# Patient Record
Sex: Female | Born: 1955 | Race: White | Hispanic: No | Smoking: Former smoker
Health system: Southern US, Community
[De-identification: ages and names within clinical notes are randomized; demographics above are authoritative.]

## PROBLEM LIST (undated history)

## (undated) DIAGNOSIS — B1920 Unspecified viral hepatitis C without hepatic coma: Secondary | ICD-10-CM

## (undated) DIAGNOSIS — N200 Calculus of kidney: Secondary | ICD-10-CM

## (undated) DIAGNOSIS — M810 Age-related osteoporosis without current pathological fracture: Secondary | ICD-10-CM

## (undated) DIAGNOSIS — C3492 Malignant neoplasm of unspecified part of left bronchus or lung: Secondary | ICD-10-CM

## (undated) DIAGNOSIS — Z972 Presence of dental prosthetic device (complete) (partial): Secondary | ICD-10-CM

## (undated) DIAGNOSIS — C73 Malignant neoplasm of thyroid gland: Secondary | ICD-10-CM

## (undated) DIAGNOSIS — E785 Hyperlipidemia, unspecified: Secondary | ICD-10-CM

## (undated) DIAGNOSIS — E039 Hypothyroidism, unspecified: Secondary | ICD-10-CM

## (undated) DIAGNOSIS — J449 Chronic obstructive pulmonary disease, unspecified: Secondary | ICD-10-CM

## (undated) DIAGNOSIS — D499 Neoplasm of unspecified behavior of unspecified site: Secondary | ICD-10-CM

## (undated) DIAGNOSIS — C229 Malignant neoplasm of liver, not specified as primary or secondary: Secondary | ICD-10-CM

## (undated) DIAGNOSIS — K2270 Barrett's esophagus without dysplasia: Secondary | ICD-10-CM

## (undated) DIAGNOSIS — R918 Other nonspecific abnormal finding of lung field: Secondary | ICD-10-CM

## (undated) DIAGNOSIS — Z87442 Personal history of urinary calculi: Secondary | ICD-10-CM

## (undated) DIAGNOSIS — E119 Type 2 diabetes mellitus without complications: Secondary | ICD-10-CM

## (undated) DIAGNOSIS — I499 Cardiac arrhythmia, unspecified: Secondary | ICD-10-CM

## (undated) DIAGNOSIS — G893 Neoplasm related pain (acute) (chronic): Secondary | ICD-10-CM

## (undated) DIAGNOSIS — K219 Gastro-esophageal reflux disease without esophagitis: Secondary | ICD-10-CM

## (undated) DIAGNOSIS — I10 Essential (primary) hypertension: Secondary | ICD-10-CM

## (undated) HISTORY — DX: Calculus of kidney: N20.0

## (undated) HISTORY — DX: Malignant neoplasm of unspecified part of left bronchus or lung: C34.92

## (undated) HISTORY — DX: Neoplasm of unspecified behavior of unspecified site: D49.9

## (undated) HISTORY — DX: Hyperlipidemia, unspecified: E78.5

## (undated) HISTORY — DX: Age-related osteoporosis without current pathological fracture: M81.0

## (undated) HISTORY — DX: Neoplasm related pain (acute) (chronic): G89.3

## (undated) HISTORY — DX: Other nonspecific abnormal finding of lung field: R91.8

## (undated) MED FILL — Fosaprepitant Dimeglumine For IV Infusion 150 MG (Base Eq): INTRAVENOUS | Qty: 5 | Status: AC

## (undated) MED FILL — Dexamethasone Sodium Phosphate Inj 100 MG/10ML: INTRAMUSCULAR | Qty: 1 | Status: AC

---
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
## 1999-02-16 HISTORY — PX: EYE SURGERY: SHX253

## 2004-02-16 HISTORY — PX: ABDOMINAL HYSTERECTOMY: SHX81

## 2006-02-15 DIAGNOSIS — B1920 Unspecified viral hepatitis C without hepatic coma: Secondary | ICD-10-CM

## 2006-02-15 HISTORY — DX: Unspecified viral hepatitis C without hepatic coma: B19.20

## 2007-02-16 HISTORY — PX: LITHOTRIPSY: SUR834

## 2008-02-16 HISTORY — PX: THYROIDECTOMY: SHX17

## 2010-02-15 DIAGNOSIS — C73 Malignant neoplasm of thyroid gland: Secondary | ICD-10-CM

## 2010-02-15 HISTORY — DX: Malignant neoplasm of thyroid gland: C73

## 2010-10-06 DIAGNOSIS — Z720 Tobacco use: Secondary | ICD-10-CM | POA: Insufficient documentation

## 2010-10-06 DIAGNOSIS — M810 Age-related osteoporosis without current pathological fracture: Secondary | ICD-10-CM | POA: Insufficient documentation

## 2014-01-29 DIAGNOSIS — Z85850 Personal history of malignant neoplasm of thyroid: Secondary | ICD-10-CM | POA: Insufficient documentation

## 2014-01-29 HISTORY — DX: Personal history of malignant neoplasm of thyroid: Z85.850

## 2015-03-12 ENCOUNTER — Other Ambulatory Visit: Payer: Self-pay | Admitting: Internal Medicine

## 2015-03-12 DIAGNOSIS — B182 Chronic viral hepatitis C: Secondary | ICD-10-CM | POA: Insufficient documentation

## 2015-03-12 DIAGNOSIS — E890 Postprocedural hypothyroidism: Secondary | ICD-10-CM | POA: Insufficient documentation

## 2015-03-12 DIAGNOSIS — I10 Essential (primary) hypertension: Secondary | ICD-10-CM | POA: Insufficient documentation

## 2015-03-12 DIAGNOSIS — K219 Gastro-esophageal reflux disease without esophagitis: Secondary | ICD-10-CM | POA: Insufficient documentation

## 2015-03-12 DIAGNOSIS — Z1231 Encounter for screening mammogram for malignant neoplasm of breast: Secondary | ICD-10-CM

## 2015-04-01 ENCOUNTER — Ambulatory Visit
Admission: RE | Admit: 2015-04-01 | Discharge: 2015-04-01 | Disposition: A | Discharge status: Home / Self Care | Payer: Managed Care, Other (non HMO) | Source: Ambulatory Visit | Attending: Internal Medicine | Admitting: Internal Medicine

## 2015-04-01 ENCOUNTER — Other Ambulatory Visit: Payer: Self-pay | Admitting: Student

## 2015-04-01 DIAGNOSIS — Z1231 Encounter for screening mammogram for malignant neoplasm of breast: Secondary | ICD-10-CM | POA: Insufficient documentation

## 2015-04-01 DIAGNOSIS — B182 Chronic viral hepatitis C: Secondary | ICD-10-CM

## 2015-04-08 ENCOUNTER — Inpatient Hospital Stay
Admission: EM | Admit: 2015-04-08 | Discharge: 2015-04-10 | DRG: 689 | Disposition: A | Discharge status: Home / Self Care | Payer: Managed Care, Other (non HMO) | Attending: Internal Medicine | Admitting: Internal Medicine

## 2015-04-08 ENCOUNTER — Emergency Department: Payer: Managed Care, Other (non HMO)

## 2015-04-08 ENCOUNTER — Encounter: Payer: Self-pay | Admitting: Emergency Medicine

## 2015-04-08 DIAGNOSIS — N390 Urinary tract infection, site not specified: Secondary | ICD-10-CM | POA: Diagnosis present

## 2015-04-08 DIAGNOSIS — E890 Postprocedural hypothyroidism: Secondary | ICD-10-CM | POA: Diagnosis present

## 2015-04-08 DIAGNOSIS — B1920 Unspecified viral hepatitis C without hepatic coma: Secondary | ICD-10-CM | POA: Diagnosis present

## 2015-04-08 DIAGNOSIS — I10 Essential (primary) hypertension: Secondary | ICD-10-CM | POA: Diagnosis present

## 2015-04-08 DIAGNOSIS — N12 Tubulo-interstitial nephritis, not specified as acute or chronic: Principal | ICD-10-CM | POA: Diagnosis present

## 2015-04-08 DIAGNOSIS — R109 Unspecified abdominal pain: Secondary | ICD-10-CM | POA: Diagnosis present

## 2015-04-08 DIAGNOSIS — G9340 Encephalopathy, unspecified: Secondary | ICD-10-CM | POA: Diagnosis present

## 2015-04-08 DIAGNOSIS — D72829 Elevated white blood cell count, unspecified: Secondary | ICD-10-CM | POA: Diagnosis present

## 2015-04-08 DIAGNOSIS — Z85850 Personal history of malignant neoplasm of thyroid: Secondary | ICD-10-CM | POA: Diagnosis not present

## 2015-04-08 DIAGNOSIS — B9620 Unspecified Escherichia coli [E. coli] as the cause of diseases classified elsewhere: Secondary | ICD-10-CM | POA: Diagnosis present

## 2015-04-08 DIAGNOSIS — N200 Calculus of kidney: Secondary | ICD-10-CM | POA: Diagnosis present

## 2015-04-08 DIAGNOSIS — K219 Gastro-esophageal reflux disease without esophagitis: Secondary | ICD-10-CM | POA: Diagnosis present

## 2015-04-08 DIAGNOSIS — E876 Hypokalemia: Secondary | ICD-10-CM | POA: Diagnosis present

## 2015-04-08 DIAGNOSIS — Z8249 Family history of ischemic heart disease and other diseases of the circulatory system: Secondary | ICD-10-CM | POA: Diagnosis not present

## 2015-04-08 DIAGNOSIS — K2270 Barrett's esophagus without dysplasia: Secondary | ICD-10-CM | POA: Diagnosis present

## 2015-04-08 DIAGNOSIS — F17210 Nicotine dependence, cigarettes, uncomplicated: Secondary | ICD-10-CM | POA: Diagnosis present

## 2015-04-08 DIAGNOSIS — A419 Sepsis, unspecified organism: Secondary | ICD-10-CM | POA: Diagnosis present

## 2015-04-08 HISTORY — DX: Barrett's esophagus without dysplasia: K22.70

## 2015-04-08 HISTORY — DX: Hypothyroidism, unspecified: E03.9

## 2015-04-08 HISTORY — DX: Essential (primary) hypertension: I10

## 2015-04-08 HISTORY — DX: Malignant neoplasm of thyroid gland: C73

## 2015-04-08 HISTORY — DX: Gastro-esophageal reflux disease without esophagitis: K21.9

## 2015-04-08 HISTORY — DX: Unspecified viral hepatitis C without hepatic coma: B19.20

## 2015-04-08 LAB — URINALYSIS COMPLETE WITH MICROSCOPIC (ARMC ONLY)
Bilirubin Urine: NEGATIVE
Glucose, UA: NEGATIVE mg/dL
Nitrite: NEGATIVE
Protein, ur: 100 mg/dL — AB
Specific Gravity, Urine: 1.02 (ref 1.005–1.030)
pH: 5 (ref 5.0–8.0)

## 2015-04-08 LAB — COMPREHENSIVE METABOLIC PANEL
ALT: 16 U/L (ref 14–54)
AST: 18 U/L (ref 15–41)
Albumin: 3.4 g/dL — ABNORMAL LOW (ref 3.5–5.0)
Alkaline Phosphatase: 74 U/L (ref 38–126)
Anion gap: 10 (ref 5–15)
BUN: 23 mg/dL — ABNORMAL HIGH (ref 6–20)
CO2: 24 mmol/L (ref 22–32)
Calcium: 8.4 mg/dL — ABNORMAL LOW (ref 8.9–10.3)
Chloride: 100 mmol/L — ABNORMAL LOW (ref 101–111)
Creatinine, Ser: 1 mg/dL (ref 0.44–1.00)
GFR calc Af Amer: >60 mL/min (ref >60)
GFR calc non Af Amer: >60 mL/min (ref >60)
Glucose, Bld: 173 mg/dL — ABNORMAL HIGH (ref 65–99)
Potassium: 3.4 mmol/L — ABNORMAL LOW (ref 3.5–5.1)
Sodium: 134 mmol/L — ABNORMAL LOW (ref 135–145)
Total Bilirubin: 1.3 mg/dL — ABNORMAL HIGH (ref 0.3–1.2)
Total Protein: 7.2 g/dL (ref 6.5–8.1)

## 2015-04-08 LAB — CBC WITH DIFFERENTIAL/PLATELET
Basophils Absolute: 0.1 10*3/uL (ref 0–0.1)
Basophils Relative: 0 %
Eosinophils Absolute: 0 10*3/uL (ref 0–0.7)
Eosinophils Relative: 0 %
HCT: 40 % (ref 35.0–47.0)
Hemoglobin: 13.1 g/dL (ref 12.0–16.0)
Lymphocytes Relative: 7 %
Lymphs Abs: 1.4 10*3/uL (ref 1.0–3.6)
MCH: 28.6 pg (ref 26.0–34.0)
MCHC: 32.8 g/dL (ref 32.0–36.0)
MCV: 87.1 fL (ref 80.0–100.0)
Monocytes Absolute: 2 10*3/uL — ABNORMAL HIGH (ref 0.2–0.9)
Monocytes Relative: 9 %
Neutro Abs: 17.9 10*3/uL — ABNORMAL HIGH (ref 1.4–6.5)
Neutrophils Relative %: 84 %
Platelets: 185 10*3/uL (ref 150–440)
RBC: 4.59 MIL/uL (ref 3.80–5.20)
RDW: 14.3 % (ref 11.5–14.5)
WBC: 21.4 10*3/uL — ABNORMAL HIGH (ref 3.6–11.0)

## 2015-04-08 LAB — TROPONIN I: Troponin I: <0.03 ng/mL (ref <0.031)

## 2015-04-08 LAB — LACTIC ACID, PLASMA: Lactic Acid, Venous: 1.7 mmol/L (ref 0.5–2.0)

## 2015-04-08 LAB — LIPASE, BLOOD: Lipase: 20 U/L (ref 11–51)

## 2015-04-08 MED ORDER — ONDANSETRON HCL 4 MG/2ML IJ SOLN
4.0000 mg | Freq: Four times a day (QID) | INTRAMUSCULAR | Status: DC | PRN
Start: 2015-04-08 — End: 2015-04-10

## 2015-04-08 MED ORDER — METOPROLOL SUCCINATE ER 25 MG PO TB24
25.0000 mg | ORAL_TABLET | Freq: Every day | ORAL | Status: DC
  Administered 2015-04-09: 25 mg via ORAL
  Filled 2015-04-08: qty 1

## 2015-04-08 MED ORDER — ACETAMINOPHEN 325 MG PO TABS: 650.0000 mg | ORAL_TABLET | Freq: Four times a day (QID) | ORAL | Status: DC | PRN

## 2015-04-08 MED ORDER — ENOXAPARIN SODIUM 40 MG/0.4ML ~~LOC~~ SOLN
40.0000 mg | SUBCUTANEOUS | Status: DC
  Administered 2015-04-08 – 2015-04-09 (×2): 40 mg via SUBCUTANEOUS
  Filled 2015-04-08 (×2): qty 0.4

## 2015-04-08 MED ORDER — SODIUM CHLORIDE 0.9 % IV BOLUS (SEPSIS): 1000.0000 mL | Freq: Once | INTRAVENOUS | Status: DC

## 2015-04-08 MED ORDER — ACETAMINOPHEN 650 MG RE SUPP: 650.0000 mg | Freq: Four times a day (QID) | RECTAL | Status: DC | PRN

## 2015-04-08 MED ORDER — INFLUENZA VAC SPLIT QUAD 0.5 ML IM SUSY
0.5000 mL | PREFILLED_SYRINGE | INTRAMUSCULAR | Status: AC
  Administered 2015-04-10: 0.5 mL via INTRAMUSCULAR
  Filled 2015-04-08: qty 0.5

## 2015-04-08 MED ORDER — ONDANSETRON HCL 4 MG PO TABS: 4.0000 mg | ORAL_TABLET | Freq: Four times a day (QID) | ORAL | Status: DC | PRN

## 2015-04-08 MED ORDER — MORPHINE SULFATE (PF) 4 MG/ML IV SOLN
4.0000 mg | Freq: Once | INTRAVENOUS | Status: DC
  Filled 2015-04-08: qty 1

## 2015-04-08 MED ORDER — OXYCODONE HCL 5 MG PO TABS
5.0000 mg | ORAL_TABLET | ORAL | Status: DC | PRN
  Administered 2015-04-08 – 2015-04-10 (×4): 5 mg via ORAL
  Filled 2015-04-08 (×4): qty 1

## 2015-04-08 MED ORDER — POLYETHYLENE GLYCOL 3350 17 G PO PACK: 17.0000 g | PACK | Freq: Every day | ORAL | Status: DC | PRN

## 2015-04-08 MED ORDER — POTASSIUM CHLORIDE IN NACL 20-0.9 MEQ/L-% IV SOLN
INTRAVENOUS | Status: DC
  Administered 2015-04-08 (×2): via INTRAVENOUS
  Filled 2015-04-08 (×3): qty 1000

## 2015-04-08 MED ORDER — DEXTROSE 5 % IV SOLN
1.0000 g | Freq: Once | INTRAVENOUS | Status: AC
  Administered 2015-04-08: 1 g via INTRAVENOUS
  Filled 2015-04-08: qty 10

## 2015-04-08 MED ORDER — SODIUM CHLORIDE 0.9 % IV BOLUS (SEPSIS)
1000.0000 mL | Freq: Once | INTRAVENOUS | Status: AC
  Administered 2015-04-08: 1000 mL via INTRAVENOUS

## 2015-04-08 MED ORDER — PANTOPRAZOLE SODIUM 40 MG PO TBEC
40.0000 mg | DELAYED_RELEASE_TABLET | Freq: Every day | ORAL | Status: DC
  Administered 2015-04-09: 40 mg via ORAL
  Filled 2015-04-08: qty 1

## 2015-04-08 MED ORDER — DOCUSATE SODIUM 100 MG PO CAPS
100.0000 mg | ORAL_CAPSULE | Freq: Two times a day (BID) | ORAL | Status: DC
  Administered 2015-04-08 – 2015-04-09 (×4): 100 mg via ORAL
  Filled 2015-04-08 (×4): qty 1

## 2015-04-08 MED ORDER — MORPHINE SULFATE (PF) 2 MG/ML IV SOLN: 2.0000 mg | INTRAVENOUS | Status: DC | PRN

## 2015-04-08 MED ORDER — DEXTROSE 5 % IV SOLN
1.0000 g | INTRAVENOUS | Status: DC
  Administered 2015-04-09 – 2015-04-10 (×2): 1 g via INTRAVENOUS
  Filled 2015-04-08 (×2): qty 10

## 2015-04-08 MED ORDER — ONDANSETRON HCL 4 MG/2ML IJ SOLN
4.0000 mg | Freq: Once | INTRAMUSCULAR | Status: AC
  Administered 2015-04-08: 4 mg via INTRAVENOUS
  Filled 2015-04-08: qty 2

## 2015-04-08 MED ORDER — LEVOTHYROXINE SODIUM 75 MCG PO TABS
150.0000 ug | ORAL_TABLET | Freq: Every day | ORAL | Status: DC
  Administered 2015-04-08 – 2015-04-10 (×2): 150 ug via ORAL
  Filled 2015-04-08 (×2): qty 2

## 2015-04-08 NOTE — Consult Note (Signed)
Urology Consult  Referring physician: Dr. Darvin Neighbours Reason for referral: nephrolithiasis, pyelonephritis  Chief Complaint: right flank pain  History of Present Illness: Ms Felicia Manning is a 60yo who presented to the ER with a 3 day hx of fever and right flank pain. She has an extensive hx of nephrolithiasis and required intervention for multiple ureteral calculi with her last intervention 3-4 years ago. She has had Ureteroscopy and ESWL. He currently pain is dull, constant, moderate, nonradiating right flank pain. Nothing has helped the pain. She denies hx of pyelonephritis. Currently she is afebrile. WBC count is 21.4. Lactate 1.7. She also has dysuria, urinary frequency and worsening urinary urgency for the past 5 days. No hematuria. CT scan shows bilateral renal calculi with no obstructing ureteral calculi and no hydronephrosis.   Past Medical History  Diagnosis Date  . Cancer [facility]) 2012    thyroid ca  . Hypertension   . Hepatitis C   . Hypothyroidism   . Thyroid cancer ([place])   . GERD (gastroesophageal reflux disease)   . Barrett's esophagus    History reviewed. No pertinent past surgical history.  Medications: I have reviewed the patient's current medications. Allergies: Not on File  Family History  Problem Relation Age of Onset  . CAD Mother    Social History:  reports that she has been smoking Cigarettes.  She has been smoking about 1.00 pack per day. She does not have any smokeless tobacco history on file. She reports that she drinks alcohol. Her drug history is not on file.  Review of Systems  Constitutional: Positive for fever and chills.  Genitourinary: Positive for dysuria, urgency, frequency and flank pain.  All other systems reviewed and are negative.   Physical Exam:  Vital signs in last 24 hours: Temp:  [98.4 F (36.9 C)] 98.4 F (36.9 C) (02/21 0843) Pulse Rate:  [66-81] 66 (02/21 1300) Resp:  [15-18] 16 (02/21 1300) BP: (105-124)/(68-77) 115/68 mmHg (02/21  1300) SpO2:  [94 %-96 %] 95 % (02/21 1300) Weight:  [85.73 kg (189 lb)] 85.73 kg (189 lb) (02/21 0843) Physical Exam  Constitutional: She is oriented to person, place, and time. She appears well-developed and well-nourished.  HENT:  Head: Normocephalic and atraumatic.  Eyes: EOM are normal. Pupils are equal, round, and reactive to light.  Neck: Normal range of motion. No thyromegaly present.  Cardiovascular: Normal rate and regular rhythm.   Respiratory: Effort normal. No respiratory distress.  GI: Soft. Normal appearance. She exhibits no distension. There is no tenderness. There is CVA tenderness.  Musculoskeletal: Normal range of motion.  Neurological: She is alert and oriented to person, place, and time.  Skin: Skin is warm and dry.  Psychiatric: She has a normal mood and affect. Her behavior is normal. Judgment and thought content normal.    Laboratory Data:  Results for orders placed or performed during the hospital encounter of 04/08/15 (from the past 72 hour(s))  CBC with Differential/Platelet     Status: Abnormal   Collection Time: 04/08/15  8:41 AM  Result Value Ref Range   WBC 21.4 (H) 3.6 - 11.0 K/uL   RBC 4.59 3.80 - 5.20 MIL/uL   Hemoglobin 13.1 12.0 - 16.0 g/dL   HCT 40.0 35.0 - 47.0 %   MCV 87.1 80.0 - 100.0 fL   MCH 28.6 26.0 - 34.0 pg   MCHC 32.8 32.0 - 36.0 g/dL   RDW 14.3 11.5 - 14.5 %   Platelets 185 150 - 440 K/uL   Neutrophils Relative %  84 %   Neutro Abs 17.9 (H) 1.4 - 6.5 K/uL   Lymphocytes Relative 7 %   Lymphs Abs 1.4 1.0 - 3.6 K/uL   Monocytes Relative 9 %   Monocytes Absolute 2.0 (H) 0.2 - 0.9 K/uL   Eosinophils Relative 0 %   Eosinophils Absolute 0.0 0 - 0.7 K/uL   Basophils Relative 0 %   Basophils Absolute 0.1 0 - 0.1 K/uL  Comprehensive metabolic panel     Status: Abnormal   Collection Time: 04/08/15  8:41 AM  Result Value Ref Range   Sodium 134 (L) 135 - 145 mmol/L   Potassium 3.4 (L) 3.5 - 5.1 mmol/L   Chloride 100 (L) 101 - 111 mmol/L    CO2 24 22 - 32 mmol/L   Glucose, Bld 173 (H) 65 - 99 mg/dL   BUN 23 (H) 6 - 20 mg/dL   Creatinine, Ser 1.00 0.44 - 1.00 mg/dL   Calcium 8.4 (L) 8.9 - 10.3 mg/dL   Total Protein 7.2 6.5 - 8.1 g/dL   Albumin 3.4 (L) 3.5 - 5.0 g/dL   AST 18 15 - 41 U/L   ALT 16 14 - 54 U/L   Alkaline Phosphatase 74 38 - 126 U/L   Total Bilirubin 1.3 (H) 0.3 - 1.2 mg/dL   GFR calc non Af Amer >60 >60 mL/min   GFR calc Af Amer >60 >60 mL/min    Comment: (NOTE) The eGFR has been calculated using the CKD EPI equation. This calculation has not been validated in all clinical situations. eGFR's persistently <60 mL/min signify possible Chronic Kidney Disease.    Anion gap 10 5 - 15  Troponin I     Status: None   Collection Time: 04/08/15  8:41 AM  Result Value Ref Range   Troponin I <0.03 <0.031 ng/mL    Comment:        NO INDICATION OF MYOCARDIAL INJURY.   Lipase, blood     Status: None   Collection Time: 04/08/15  8:41 AM  Result Value Ref Range   Lipase 20 11 - 51 U/L  Lactic acid, plasma     Status: None   Collection Time: 04/08/15  9:49 AM  Result Value Ref Range   Lactic Acid, Venous 1.7 0.5 - 2.0 mmol/L  Urinalysis complete, with microscopic ([facility] only)     Status: Abnormal   Collection Time: 04/08/15  9:49 AM  Result Value Ref Range   Color, Urine AMBER (A) YELLOW   APPearance CLOUDY (A) CLEAR   Glucose, UA NEGATIVE NEGATIVE mg/dL   Bilirubin Urine NEGATIVE NEGATIVE   Ketones, ur TRACE (A) NEGATIVE mg/dL   Specific Gravity, Urine 1.020 1.005 - 1.030   Hgb urine dipstick 2+ (A) NEGATIVE   pH 5.0 5.0 - 8.0   Protein, ur 100 (A) NEGATIVE mg/dL   Nitrite NEGATIVE NEGATIVE   Leukocytes, UA 3+ (A) NEGATIVE   RBC / HPF TOO NUMEROUS TO COUNT 0 - 5 RBC/hpf   WBC, UA TOO NUMEROUS TO COUNT 0 - 5 WBC/hpf   Bacteria, UA MANY (A) NONE SEEN   Squamous Epithelial / LPF 6-30 (A) NONE SEEN   WBC Clumps PRESENT    Mucous PRESENT    Hyaline Casts, UA PRESENT    No results found for this or any  previous visit (from the past 240 hour(s)). Creatinine:  Recent Labs  04/08/15 0841  CREATININE 1.00   Baseline Creatinine: unknown  Impression/Assessment:  60yo with bilateral renal calculi and right  pyelonephritis  Plan:  1. Pyelonephritis: Agree with Rocephin until urine culture is finalized 2. I discussed the management of nonobstructing renal calculi with the patient which will likely involve a staged ureteroscopic procedure. I explained to the patient that she will need to be on culture specific antibiotics for at least 14 days prior to any intervention. We will coordinate scheduling here stone surgery at the time of discharge.  Urology to continue to follow.  Shan Padgett L 04/08/2015, 2:10 PM

## 2015-04-08 NOTE — ED Provider Notes (Signed)
CSN: [id number]     Arrival date & time 04/08/15  7939 History   First MD Initiated Contact with Patient 04/08/15 [phone]     Chief Complaint  Patient presents with  . Flank Pain     (Consider location/radiation/quality/duration/timing/severity/associated sxs/prior Treatment) The history is provided by the patient.  Felicia Manning is a 60 y.o. female hx of thyroid cancer, hep C, kidney stones, here with chills, fevers, dizziness, flank pain. Symptoms for the last 3 days. Has subjective fevers and chills. Also has some nonproductive cough. Patient has intermittent right-sided flank pain as well as some dysuria. Feeling nauseated today but no vomiting.     Past Medical History  Diagnosis Date  . Cancer [facility]) 2012    thyroid ca  . Hypertension    History reviewed. No pertinent past surgical history. No family history on file. Social History  Substance Use Topics  . Smoking status: Current Every Day Smoker -- 1.00 packs/day    Types: Cigarettes  . Smokeless tobacco: None  . Alcohol Use: Yes   OB History    No data available     Review of Systems  Constitutional: Positive for fever and chills.  Respiratory: Positive for cough.   Genitourinary: Positive for flank pain.  All other systems reviewed and are negative.     Allergies  Review of patient's allergies indicates not on file.  Home Medications   Prior to Admission medications   Medication Sig Start Date End Date Taking? Authorizing Provider  levothyroxine (SYNTHROID, LEVOTHROID) 150 MCG tablet Take 150 mcg by mouth daily. Take with a full glass of water at least 30 minutes before breakfast   Yes Historical Provider, MD  losartan-hydrochlorothiazide (HYZAAR) 50-12.5 MG tablet Take 1 tablet by mouth daily.   Yes Historical Provider, MD  metoprolol succinate (TOPROL-XL) 25 MG 24 hr tablet Take 25 mg by mouth daily.   Yes Historical Provider, MD  omeprazole (PRILOSEC) 40 MG capsule Take 40 mg by mouth daily.   Yes  Historical Provider, MD   BP 109/70 mmHg  Pulse 81  Temp(Src) 98.4 F (36.9 C) (Oral)  Resp 18  Ht '5\' 7"'$  (1.702 m)  Wt 189 lb (85.73 kg)  BMI 29.59 kg/m2  SpO2 95% Physical Exam  Constitutional: She is oriented to person, place, and time.  Uncomfortable, dehydrated.   HENT:  Head: Normocephalic.  Mouth/Throat: Oropharynx is clear and moist.  Eyes: Conjunctivae are normal. Pupils are equal, round, and reactive to light.  Neck: Normal range of motion. Neck supple.  Cardiovascular: Normal rate, regular rhythm and normal heart sounds.   Pulmonary/Chest: Effort normal and breath sounds normal. No respiratory distress. She has no wheezes. She has no rales.  Abdominal: Soft. Bowel sounds are normal. She exhibits no distension.  Mild R CVAT, and suprapubic tenderness   Musculoskeletal: Normal range of motion. She exhibits no edema or tenderness.  Neurological: She is alert and oriented to person, place, and time. No cranial nerve deficit. Coordination normal.  Skin: Skin is warm and dry.  Psychiatric: She has a normal mood and affect. Her behavior is normal. Judgment and thought content normal.  Nursing note and vitals reviewed.   ED Course  Procedures (including critical care time) Labs Review Labs Reviewed  CBC WITH DIFFERENTIAL/PLATELET - Abnormal; Notable for the following:    WBC 21.4 (*)    Neutro Abs 17.9 (*)    Monocytes Absolute 2.0 (*)    All other components within normal limits  COMPREHENSIVE METABOLIC PANEL -  Abnormal; Notable for the following:    Sodium 134 (*)    Potassium 3.4 (*)    Chloride 100 (*)    Glucose, Bld 173 (*)    BUN 23 (*)    Calcium 8.4 (*)    Albumin 3.4 (*)    Total Bilirubin 1.3 (*)    All other components within normal limits  URINALYSIS COMPLETEWITH MICROSCOPIC ([facility] ONLY) - Abnormal; Notable for the following:    Color, Urine AMBER (*)    APPearance CLOUDY (*)    Ketones, ur TRACE (*)    Hgb urine dipstick 2+ (*)    Protein, ur 100  (*)    Leukocytes, UA 3+ (*)    Bacteria, UA MANY (*)    Squamous Epithelial / LPF 6-30 (*)    All other components within normal limits  CULTURE, BLOOD (ROUTINE X 2)  CULTURE, BLOOD (ROUTINE X 2)  TROPONIN I  LACTIC ACID, PLASMA  LIPASE, BLOOD    Imaging Review Dg Chest 2 View  04/08/2015  CLINICAL DATA:  Fever EXAM: CHEST  2 VIEW COMPARISON:  None. FINDINGS: Bibasilar opacities likely reflects atelectasis. Heart is normal size. No effusions. No acute bony abnormality. IMPRESSION: Bibasilar atelectasis. Electronically Signed   By: Rolm Baptise M.D.   On: 04/08/2015 09:07   Dg Abd 1 View  04/08/2015  CLINICAL DATA:  Right flank pain for 3 days.  Chills. EXAM: ABDOMEN - 1 VIEW COMPARISON:  None. FINDINGS: Multiple right renal stones are noted, the largest in the lower pole measuring 7 mm. Note definite stones over the expected course of the ureters or urinary bladder. Calcified phleboliths in the pelvis. Nonobstructive bowel gas pattern. No free air or visible organomegaly. No acute bony abnormality. IMPRESSION: Right nephrolithiasis. Electronically Signed   By: Rolm Baptise M.D.   On: 04/08/2015 09:07   Ct Renal Stone Study  04/08/2015  CLINICAL DATA:  Right side flank pain and back pain since Saturday. EXAM: CT ABDOMEN AND PELVIS WITHOUT CONTRAST TECHNIQUE: Multidetector CT imaging of the abdomen and pelvis was performed following the standard protocol without IV contrast. COMPARISON:  None. FINDINGS: Dependent atelectasis or scarring in the lung bases. Heart is normal size. No effusions. Small low-density lesions centrally within the liver, likely small cyst. Gallbladder, spleen, pancreas, adrenals are unremarkable. Bilateral nephrolithiasis. No ureteral stones or hydronephrosis bilaterally. Multiple bilateral renal stones, the largest in the left lower pole measuring up to 12 mm. Multiple right renal cysts. Descending colonic and sigmoid diverticulosis. No active diverticulitis. Stomach and  small bowel decompressed, unremarkable. Appendix is normal. No free fluid, free air or adenopathy. Aorta and iliac vessels are calcified, non aneurysmal. No acute bony abnormality or focal bone lesion. IMPRESSION: Bilateral nephrolithiasis.  If no hydronephrosis or ureteral stones. Left colonic diverticulosis. Electronically Signed   By: Rolm Baptise M.D.   On: 04/08/2015 09:49   I have personally reviewed and evaluated these images and lab results as part of my medical decision-making.   EKG Interpretation None       ED ECG REPORT I, YAO, DAVID, the attending physician, personally viewed and interpreted this ECG.   Date: 04/08/2015  EKG Time: 8:39  Rate: 82  Rhythm: normal EKG, normal sinus rhythm  Axis: normal  Intervals:none  ST&T Change: none   MDM   Final diagnoses:  None   Felicia Manning is a 60 y.o. female here with chills, fever, flank pain. Consider stone vs pyelo vs pneumonia vs pancreatitis. Will get sepsis workup,  CXR, UA. If UA showed no infection and has blood then will get CT ab/pel.  10:58 AM UA + blood and UTI. CT renal stone showed bilateral stones but no hydro. WBC 21. BP soft around 633 systolic. Lactate nl. Afebrile. Given ceftriaxone. Concerned for possible sepsis from pyelo vs infected stone. Consulted Dr. Marthann Schiller from urology, who will see patient. Will admit for abx on medical service.      Wandra Arthurs, MD 04/08/15 [phone]

## 2015-04-08 NOTE — H&P (Signed)
Felicia Manning NAME: Azaiah Mello    MR#:  [id number]  DATE OF BIRTH:  27-Feb-1955  DATE OF ADMISSION:  04/08/2015  PRIMARY CARE PHYSICIAN: No primary care provider on file.   REQUESTING/REFERRING PHYSICIAN: Dr. Darl Householder  CHIEF COMPLAINT:   Chief Complaint  Patient presents with  . Flank Pain    HISTORY OF PRESENT ILLNESS:  Felicia Manning  is a 60 y.o. female with a known history of HTN, Hypothyroidism here with right flank and back pain. Symptoms started on Saturday with fevers, chills and feeling weak. Patient thought her pain was likely from ureteral stone that she had in the past and tried to drink a lot of water. Did not improve and today she felt extremely dizzy on standing up and presented to the emergency room. Found to have UTI. WBC 21k. Blood pressure is in 63A -453 systolic.  She does have history of ureteral stones with lithotripsy and ureteral stents in the past. No ureteral stent at this time.  CT scan showed a immobile nephrolithiasis. No obstruction or hydronephrosis.  PAST MEDICAL HISTORY:   Past Medical History  Diagnosis Date  . Cancer [facility]) 2012    thyroid ca  . Hypertension   . Hepatitis C   . Hypothyroidism   . Thyroid cancer ([place])   . GERD (gastroesophageal reflux disease)   . Barrett's esophagus     PAST SURGICAL HISTORY:  History reviewed. No pertinent past surgical history.  SOCIAL HISTORY:   Social History  Substance Use Topics  . Smoking status: Current Every Day Smoker -- 1.00 packs/day    Types: Cigarettes  . Smokeless tobacco: Not on file  . Alcohol Use: Yes    FAMILY HISTORY:   Family History  Problem Relation Age of Onset  . CAD Mother     DRUG ALLERGIES:  Not on File  REVIEW OF SYSTEMS:   Review of Systems  Constitutional: Positive for fever, chills and malaise/fatigue. Negative for weight loss.  HENT: Negative for hearing loss and nosebleeds.   Eyes:  Negative for blurred vision, double vision and pain.  Respiratory: Negative for cough, hemoptysis, sputum production, shortness of breath and wheezing.   Cardiovascular: Negative for chest pain, palpitations, orthopnea and leg swelling.  Gastrointestinal: Positive for nausea. Negative for vomiting, abdominal pain, diarrhea and constipation.  Genitourinary: Positive for flank pain. Negative for dysuria and hematuria.  Musculoskeletal: Negative for myalgias, back pain and falls.  Skin: Negative for rash.  Neurological: Positive for dizziness. Negative for tremors, sensory change, speech change, focal weakness, seizures and headaches.  Endo/Heme/Allergies: Does not bruise/bleed easily.  Psychiatric/Behavioral: Negative for depression and memory loss. The patient is not nervous/anxious.     MEDICATIONS AT HOME:   Prior to Admission medications   Medication Sig Start Date End Date Taking? Authorizing Provider  levothyroxine (SYNTHROID, LEVOTHROID) 150 MCG tablet Take 150 mcg by mouth daily. Take with a full glass of water at least 30 minutes before breakfast   Yes Historical Provider, MD  losartan-hydrochlorothiazide (HYZAAR) 50-12.5 MG tablet Take 1 tablet by mouth daily.   Yes Historical Provider, MD  metoprolol succinate (TOPROL-XL) 25 MG 24 hr tablet Take 25 mg by mouth daily.   Yes Historical Provider, MD  omeprazole (PRILOSEC) 40 MG capsule Take 40 mg by mouth daily.   Yes Historical Provider, MD      VITAL SIGNS:  Blood pressure 109/70, pulse 81, temperature 98.4 F (36.9 C), temperature source Oral,  resp. rate 18, height '5\' 7"'$  (1.702 m), weight 85.73 kg (189 lb), SpO2 95 %.  PHYSICAL EXAMINATION:  Physical Exam  GENERAL:  60 y.o.-year-old patient lying in the bed with no acute distress.  EYES: Pupils equal, round, reactive to light and accommodation. No scleral icterus. Extraocular muscles intact.  HEENT: Head atraumatic, normocephalic. Oropharynx and nasopharynx clear. No  oropharyngeal erythema, moist oral mucosa  NECK:  Supple, no jugular venous distention. No thyroid enlargement, no tenderness.  LUNGS: Normal breath sounds bilaterally, no wheezing, rales, rhonchi. No use of accessory muscles of respiration.  CARDIOVASCULAR: S1, S2 normal. No murmurs, rubs, or gallops.  ABDOMEN: Soft, nontender, nondistended. Bowel sounds present. No organomegaly or mass.  EXTREMITIES: No pedal edema, cyanosis, or clubbing. + 2 pedal & radial pulses b/l.   NEUROLOGIC: Cranial nerves II through XII are intact. No focal Motor or sensory deficits appreciated b/l PSYCHIATRIC: The patient is alert and oriented x 3. Good affect.  SKIN: No obvious rash, lesion, or ulcer.   LABORATORY PANEL:   CBC  Recent Labs Lab 04/08/15 0841  WBC 21.4*  HGB 13.1  HCT 40.0  PLT 185   ------------------------------------------------------------------------------------------------------------------  Chemistries   Recent Labs Lab 04/08/15 0841  NA 134*  K 3.4*  CL 100*  CO2 24  GLUCOSE 173*  BUN 23*  CREATININE 1.00  CALCIUM 8.4*  AST 18  ALT 16  ALKPHOS 74  BILITOT 1.3*   ------------------------------------------------------------------------------------------------------------------  Cardiac Enzymes  Recent Labs Lab 04/08/15 0841  TROPONINI <0.03   ------------------------------------------------------------------------------------------------------------------  RADIOLOGY:  Dg Chest 2 View  04/08/2015  CLINICAL DATA:  Fever EXAM: CHEST  2 VIEW COMPARISON:  None. FINDINGS: Bibasilar opacities likely reflects atelectasis. Heart is normal size. No effusions. No acute bony abnormality. IMPRESSION: Bibasilar atelectasis. Electronically Signed   By: Rolm Baptise M.D.   On: 04/08/2015 09:07   Dg Abd 1 View  04/08/2015  CLINICAL DATA:  Right flank pain for 3 days.  Chills. EXAM: ABDOMEN - 1 VIEW COMPARISON:  None. FINDINGS: Multiple right renal stones are noted, the  largest in the lower pole measuring 7 mm. Note definite stones over the expected course of the ureters or urinary bladder. Calcified phleboliths in the pelvis. Nonobstructive bowel gas pattern. No free air or visible organomegaly. No acute bony abnormality. IMPRESSION: Right nephrolithiasis. Electronically Signed   By: Rolm Baptise M.D.   On: 04/08/2015 09:07   Ct Renal Stone Study  04/08/2015  CLINICAL DATA:  Right side flank pain and back pain since Saturday. EXAM: CT ABDOMEN AND PELVIS WITHOUT CONTRAST TECHNIQUE: Multidetector CT imaging of the abdomen and pelvis was performed following the standard protocol without IV contrast. COMPARISON:  None. FINDINGS: Dependent atelectasis or scarring in the lung bases. Heart is normal size. No effusions. Small low-density lesions centrally within the liver, likely small cyst. Gallbladder, spleen, pancreas, adrenals are unremarkable. Bilateral nephrolithiasis. No ureteral stones or hydronephrosis bilaterally. Multiple bilateral renal stones, the largest in the left lower pole measuring up to 12 mm. Multiple right renal cysts. Descending colonic and sigmoid diverticulosis. No active diverticulitis. Stomach and small bowel decompressed, unremarkable. Appendix is normal. No free fluid, free air or adenopathy. Aorta and iliac vessels are calcified, non aneurysmal. No acute bony abnormality or focal bone lesion. IMPRESSION: Bilateral nephrolithiasis.  If no hydronephrosis or ureteral stones. Left colonic diverticulosis. Electronically Signed   By: Rolm Baptise M.D.   On: 04/08/2015 09:49     IMPRESSION AND PLAN:   * UTI with sepsis  Bolus normal saline now. Start IV ceftriaxone and sent for cultures.  * Hypertension Hold medications  * Hypokalemia Replace and repeat  * GERD PPI  * DVT prophylaxis with Lovenox   All the records are reviewed and case discussed with ED provider. Management plans discussed with the patient, family and they are in  agreement.  CODE STATUS: FULL CODE  TOTAL TIME TAKING CARE OF THIS PATIENT: 40 minutes.    Hillary Bow R M.D on 04/08/2015 at 11:18 AM  Between 7am to 6pm - Pager - [phone]  After 6pm go to [email or web address] - password [email or web address] [facility]  [facility]  Office  [phone]  CC: Primary care physician; No primary care provider on file.   Note: This dictation was prepared with Dragon dictation along with smaller phrase technology. Any transcriptional errors that result from this process are unintentional.

## 2015-04-08 NOTE — ED Notes (Signed)
Patient presents to the ED with right flank pain x 3 days, chills, and dizziness.  Patient presents from home from [facility].  Patient is in no obvious distress at this time.

## 2015-04-09 DIAGNOSIS — N200 Calculus of kidney: Secondary | ICD-10-CM | POA: Insufficient documentation

## 2015-04-09 DIAGNOSIS — N12 Tubulo-interstitial nephritis, not specified as acute or chronic: Secondary | ICD-10-CM | POA: Insufficient documentation

## 2015-04-09 DIAGNOSIS — R109 Unspecified abdominal pain: Secondary | ICD-10-CM

## 2015-04-09 LAB — BASIC METABOLIC PANEL
Anion gap: 6 (ref 5–15)
BUN: 17 mg/dL (ref 6–20)
CO2: 24 mmol/L (ref 22–32)
Calcium: 8 mg/dL — ABNORMAL LOW (ref 8.9–10.3)
Chloride: 109 mmol/L (ref 101–111)
Creatinine, Ser: 0.72 mg/dL (ref 0.44–1.00)
GFR calc Af Amer: >60 mL/min (ref >60)
GFR calc non Af Amer: >60 mL/min (ref >60)
Glucose, Bld: 104 mg/dL — ABNORMAL HIGH (ref 65–99)
Potassium: 4.1 mmol/L (ref 3.5–5.1)
Sodium: 139 mmol/L (ref 135–145)

## 2015-04-09 LAB — CBC
HCT: 36.6 % (ref 35.0–47.0)
Hemoglobin: 12.1 g/dL (ref 12.0–16.0)
MCH: 29.1 pg (ref 26.0–34.0)
MCHC: 33.2 g/dL (ref 32.0–36.0)
MCV: 87.9 fL (ref 80.0–100.0)
Platelets: 169 10*3/uL (ref 150–440)
RBC: 4.17 MIL/uL (ref 3.80–5.20)
RDW: 14.1 % (ref 11.5–14.5)
WBC: 9.7 10*3/uL (ref 3.6–11.0)

## 2015-04-09 MED ORDER — SODIUM CHLORIDE 0.9 % IV SOLN
INTRAVENOUS | Status: DC
  Administered 2015-04-09: 50 mL via INTRAVENOUS

## 2015-04-09 NOTE — Progress Notes (Signed)
Patient ID: Felicia Manning, female   DOB: [birth year], 60 y.o.   MRN: [id number] [facility] PROGRESS NOTE  Felicia Manning SPQ:[id number] DOB: [birth year] DOA: 04/08/2015 PCP: Glendon Axe, MD  HPI/Subjective: Patient had altered mental status and fever at home. Found to have a kidney stone and urinary infection. Patient not having much pain at all. She is urinating well.  Objective: Filed Vitals:   04/08/15 1946 04/09/15 0436  BP: 121/51 103/55  Pulse: 97 68  Temp: 99 F (37.2 C) 99 F (37.2 C)  Resp: 18 19    Filed Weights   04/08/15 0843 04/08/15 1408  Weight: 85.73 kg (189 lb) 84.641 kg (186 lb 9.6 oz)    ROS: Review of Systems  Constitutional: Negative for fever and chills.  Eyes: Negative for blurred vision.  Respiratory: Negative for cough and shortness of breath.   Cardiovascular: Negative for chest pain.  Gastrointestinal: Positive for abdominal pain. Negative for nausea, vomiting, diarrhea and constipation.  Genitourinary: Positive for dysuria.  Musculoskeletal: Negative for joint pain.  Neurological: Negative for dizziness and headaches.   Exam: Physical Exam  HENT:  Nose: No mucosal edema.  Mouth/Throat: No oropharyngeal exudate or posterior oropharyngeal edema.  Eyes: Conjunctivae, EOM and lids are normal. Pupils are equal, round, and reactive to light.  Neck: No JVD present. Carotid bruit is not present. No edema present. No thyroid mass and no thyromegaly present.  Cardiovascular: S1 normal and S2 normal.  Exam reveals no gallop.   No murmur heard. Pulses:      Dorsalis pedis pulses are 2+ on the right side, and 2+ on the left side.  Respiratory: No respiratory distress. She has no wheezes. She has no rhonchi. She has no rales.  GI: Soft. Bowel sounds are normal. There is no tenderness.  Musculoskeletal:       Right ankle: She exhibits swelling.       Left ankle: She exhibits swelling.  Lymphadenopathy:    She has no cervical  adenopathy.  Neurological: She is alert. No cranial nerve deficit.  Skin: Skin is warm. No rash noted. Nails show no clubbing.  Psychiatric: She has a normal mood and affect.      Data Reviewed: Basic Metabolic Panel:  Recent Labs Lab 04/08/15 0841 04/09/15 0515  NA 134* 139  K 3.4* 4.1  CL 100* 109  CO2 24 24  GLUCOSE 173* 104*  BUN 23* 17  CREATININE 1.00 0.72  CALCIUM 8.4* 8.0*   Liver Function Tests:  Recent Labs Lab 04/08/15 0841  AST 18  ALT 16  ALKPHOS 74  BILITOT 1.3*  PROT 7.2  ALBUMIN 3.4*    Recent Labs Lab 04/08/15 0841  LIPASE 20   CBC:  Recent Labs Lab 04/08/15 0841 04/09/15 0515  WBC 21.4* 9.7  NEUTROABS 17.9*  --   HGB 13.1 12.1  HCT 40.0 36.6  MCV 87.1 87.9  PLT 185 169     Recent Results (from the past 240 hour(s))  Urine culture     Status: None (Preliminary result)   Collection Time: 04/08/15  9:49 AM  Result Value Ref Range Status   Specimen Description URINE, RANDOM  Final   Special Requests Normal  Final   Culture   Final    >=100,000 COLONIES/mL GRAM NEGATIVE RODS IDENTIFICATION AND SUSCEPTIBILITIES TO FOLLOW    Report Status PENDING  Incomplete  Blood culture (routine x 2)     Status: None (Preliminary result)   Collection Time: 04/08/15  9:50 AM  Result Value Ref Range Status   Specimen Description BLOOD LEFT AC  Final   Special Requests   Final    BOTTLES DRAWN AEROBIC AND ANAEROBIC AER 6ML ANA 3ML   Culture NO GROWTH < 24 HOURS  Final   Report Status PENDING  Incomplete  Blood culture (routine x 2)     Status: None (Preliminary result)   Collection Time: 04/08/15  9:55 AM  Result Value Ref Range Status   Specimen Description BLOOD RIGHT AC  Final   Special Requests   Final    BOTTLES DRAWN AEROBIC AND ANAEROBIC AER 7ML ANA 3ML   Culture NO GROWTH < 24 HOURS  Final   Report Status PENDING  Incomplete     Studies: Dg Chest 2 View  04/08/2015  CLINICAL DATA:  Fever EXAM: CHEST  2 VIEW COMPARISON:  None.  FINDINGS: Bibasilar opacities likely reflects atelectasis. Heart is normal size. No effusions. No acute bony abnormality. IMPRESSION: Bibasilar atelectasis. Electronically Signed   By: Rolm Baptise M.D.   On: 04/08/2015 09:07   Dg Abd 1 View  04/08/2015  CLINICAL DATA:  Right flank pain for 3 days.  Chills. EXAM: ABDOMEN - 1 VIEW COMPARISON:  None. FINDINGS: Multiple right renal stones are noted, the largest in the lower pole measuring 7 mm. Note definite stones over the expected course of the ureters or urinary bladder. Calcified phleboliths in the pelvis. Nonobstructive bowel gas pattern. No free air or visible organomegaly. No acute bony abnormality. IMPRESSION: Right nephrolithiasis. Electronically Signed   By: Rolm Baptise M.D.   On: 04/08/2015 09:07   Ct Renal Stone Study  04/08/2015  CLINICAL DATA:  Right side flank pain and back pain since Saturday. EXAM: CT ABDOMEN AND PELVIS WITHOUT CONTRAST TECHNIQUE: Multidetector CT imaging of the abdomen and pelvis was performed following the standard protocol without IV contrast. COMPARISON:  None. FINDINGS: Dependent atelectasis or scarring in the lung bases. Heart is normal size. No effusions. Small low-density lesions centrally within the liver, likely small cyst. Gallbladder, spleen, pancreas, adrenals are unremarkable. Bilateral nephrolithiasis. No ureteral stones or hydronephrosis bilaterally. Multiple bilateral renal stones, the largest in the left lower pole measuring up to 12 mm. Multiple right renal cysts. Descending colonic and sigmoid diverticulosis. No active diverticulitis. Stomach and small bowel decompressed, unremarkable. Appendix is normal. No free fluid, free air or adenopathy. Aorta and iliac vessels are calcified, non aneurysmal. No acute bony abnormality or focal bone lesion. IMPRESSION: Bilateral nephrolithiasis.  If no hydronephrosis or ureteral stones. Left colonic diverticulosis. Electronically Signed   By: Rolm Baptise M.D.   On:  04/08/2015 09:49    Scheduled Meds: . cefTRIAXone (ROCEPHIN)  IV  1 g Intravenous Q24H  . docusate sodium  100 mg Oral BID  . enoxaparin (LOVENOX) injection  40 mg Subcutaneous Q24H  . Influenza vac split quadrivalent PF  0.5 mL Intramuscular Tomorrow-1000  . levothyroxine  150 mcg Oral QAC breakfast  . metoprolol succinate  25 mg Oral Daily  .  morphine injection  4 mg Intravenous Once  . pantoprazole  40 mg Oral Daily  . sodium chloride  1,000 mL Intravenous Once   Continuous Infusions: . sodium chloride 50 mL (04/09/15 1200)    Assessment/Plan:  1. Suspected pyelonephritis with acute encephalopathy and leukocytosis. Urine culture growing gram-negative rods. Urology wants to wait for urine culture prior to disposition home. Hopefully we'll have finalized results tomorrow. Leukocytosis has improved. Mental status has improved. 2. Nephrolithiasis. Urology once 2 weeks  worth of antibiotics prior to any procedure for kidney stones. 3. Hypothyroidism unspecified continue levothyroxine 4. Hypertension essential continue metoprolol 5. Gastroesophageal reflux disease and Barrett's esophagus on Protonix  Code Status:     Code Status Orders        Start     Ordered   04/08/15 1116  Full code   Continuous     04/08/15 1116    Code Status History    Date Active Date Inactive Code Status Order ID Comments User Context   This patient has a current code status but no historical code status.     Disposition Plan: Likely home tomorrow  Consultants:  Urology  Antibiotics: Rocephin  Time spent: 25 minutes  Loletha Grayer  [facility]

## 2015-04-09 NOTE — Progress Notes (Signed)
Urology Consult Follow Up  Subjective: Patient is improving.  Her dizziness and flank pain are abating.   UCx and blood cultures are still pending.  WBC's are normal.  Cr is normal.    Anti-infectives: Anti-infectives    Start     Dose/Rate Route Frequency Ordered Stop   04/09/15 0800  cefTRIAXone (ROCEPHIN) 1 g in dextrose 5 % 50 mL IVPB     1 g 100 mL/hr over 30 Minutes Intravenous Every 24 hours 04/08/15 1117     04/08/15 1100  cefTRIAXone (ROCEPHIN) 1 g in dextrose 5 % 50 mL IVPB     1 g 100 mL/hr over 30 Minutes Intravenous  Once 04/08/15 1050 04/08/15 1154      Current Facility-Administered Medications  Medication Dose Route Frequency Provider Last Rate Last Dose  . 0.9 % NaCl with KCl 20 mEq/ L  infusion   Intravenous Continuous Hillary Bow, MD 100 mL/hr at 04/08/15 2145    . acetaminophen (TYLENOL) tablet 650 mg  650 mg Oral Q6H PRN Hillary Bow, MD       Or  . acetaminophen (TYLENOL) suppository 650 mg  650 mg Rectal Q6H PRN Srikar Sudini, MD      . cefTRIAXone (ROCEPHIN) 1 g in dextrose 5 % 50 mL IVPB  1 g Intravenous Q24H Hillary Bow, MD   1 g at 04/09/15 5784  . docusate sodium (COLACE) capsule 100 mg  100 mg Oral BID Hillary Bow, MD   100 mg at 04/09/15 0818  . enoxaparin (LOVENOX) injection 40 mg  40 mg Subcutaneous Q24H Hillary Bow, MD   40 mg at 04/08/15 2145  . Influenza vac split quadrivalent PF (FLUARIX) injection 0.5 mL  0.5 mL Intramuscular Tomorrow-1000 Srikar Sudini, MD      . levothyroxine (SYNTHROID, LEVOTHROID) tablet 150 mcg  150 mcg Oral QAC breakfast Hillary Bow, MD   150 mcg at 04/08/15 2146  . metoprolol succinate (TOPROL-XL) 24 hr tablet 25 mg  25 mg Oral Daily Hillary Bow, MD   25 mg at 04/09/15 0817  . morphine 2 MG/ML injection 2 mg  2 mg Intravenous Q4H PRN Srikar Sudini, MD      . morphine 4 MG/ML injection 4 mg  4 mg Intravenous Once Wandra Arthurs, MD   4 mg at 04/08/15 1003  . ondansetron (ZOFRAN) tablet 4 mg  4 mg Oral Q6H PRN Hillary Bow, MD       Or  . ondansetron (ZOFRAN) injection 4 mg  4 mg Intravenous Q6H PRN Srikar Sudini, MD      . oxyCODONE (Oxy IR/ROXICODONE) immediate release tablet 5 mg  5 mg Oral Q4H PRN Hillary Bow, MD   5 mg at 04/09/15 0817  . pantoprazole (PROTONIX) EC tablet 40 mg  40 mg Oral Daily Hillary Bow, MD   40 mg at 04/09/15 0817  . polyethylene glycol (MIRALAX / GLYCOLAX) packet 17 g  17 g Oral Daily PRN Srikar Sudini, MD      . sodium chloride 0.9 % bolus 1,000 mL  1,000 mL Intravenous Once Srikar Sudini, MD         Objective: Vital signs in last 24 hours: Temp:  [97.5 F (36.4 C)-99 F (37.2 C)] 99 F (37.2 C) (02/22 0436) Pulse Rate:  [65-97] 68 (02/22 0436) Resp:  [15-19] 19 (02/22 0436) BP: (103-124)/(51-77) 103/55 mmHg (02/22 0436) SpO2:  [94 %-100 %] 96 % (02/22 0436) Weight:  [186 lb 9.6 oz (84.641 kg)-189 lb (85.73 kg)]  186 lb 9.6 oz (84.641 kg) (02/21 1408)  Intake/Output from previous day: 02/21 0701 - 02/22 0700 In: 1861.7 [P.O.:120; I.V.:1741.7] Out: 1100 [Urine:1100] Intake/Output this shift:     Physical Exam Constitutional: Well nourished. Alert and oriented, No acute distress. HEENT: [place] AT, moist mucus membranes. Trachea midline, no masses. Cardiovascular: No clubbing, cyanosis, or edema. Respiratory: Normal respiratory effort, no increased work of breathing. GI: Abdomen is soft, non tender, non distended, no abdominal masses.  GU: Mild right CVA tenderness.  No bladder fullness or masses.   Skin: No rashes, bruises or suspicious lesions. Lymph: No cervical or inguinal adenopathy. Neurologic: Grossly intact, no focal deficits, moving all 4 extremities. Psychiatric: Normal mood and affect.  Lab Results:   Recent Labs  04/08/15 0841 04/09/15 0515  WBC 21.4* 9.7  HGB 13.1 12.1  HCT 40.0 36.6  PLT 185 169   BMET  Recent Labs  04/08/15 0841 04/09/15 0515  NA 134* 139  K 3.4* 4.1  CL 100* 109  CO2 24 24  GLUCOSE 173* 104*  BUN 23* 17   CREATININE 1.00 0.72  CALCIUM 8.4* 8.0*   PT/INR No results for input(s): LABPROT, INR in the last 72 hours. ABG No results for input(s): PHART, HCO3 in the last 72 hours.  Invalid input(s): PCO2, PO2  Studies/Results: Dg Chest 2 View  04/08/2015  CLINICAL DATA:  Fever EXAM: CHEST  2 VIEW COMPARISON:  None. FINDINGS: Bibasilar opacities likely reflects atelectasis. Heart is normal size. No effusions. No acute bony abnormality. IMPRESSION: Bibasilar atelectasis. Electronically Signed   By: Rolm Baptise M.D.   On: 04/08/2015 09:07   Dg Abd 1 View  04/08/2015  CLINICAL DATA:  Right flank pain for 3 days.  Chills. EXAM: ABDOMEN - 1 VIEW COMPARISON:  None. FINDINGS: Multiple right renal stones are noted, the largest in the lower pole measuring 7 mm. Note definite stones over the expected course of the ureters or urinary bladder. Calcified phleboliths in the pelvis. Nonobstructive bowel gas pattern. No free air or visible organomegaly. No acute bony abnormality. IMPRESSION: Right nephrolithiasis. Electronically Signed   By: Rolm Baptise M.D.   On: 04/08/2015 09:07   Ct Renal Stone Study  04/08/2015  CLINICAL DATA:  Right side flank pain and back pain since Saturday. EXAM: CT ABDOMEN AND PELVIS WITHOUT CONTRAST TECHNIQUE: Multidetector CT imaging of the abdomen and pelvis was performed following the standard protocol without IV contrast. COMPARISON:  None. FINDINGS: Dependent atelectasis or scarring in the lung bases. Heart is normal size. No effusions. Small low-density lesions centrally within the liver, likely small cyst. Gallbladder, spleen, pancreas, adrenals are unremarkable. Bilateral nephrolithiasis. No ureteral stones or hydronephrosis bilaterally. Multiple bilateral renal stones, the largest in the left lower pole measuring up to 12 mm. Multiple right renal cysts. Descending colonic and sigmoid diverticulosis. No active diverticulitis. Stomach and small bowel decompressed, unremarkable.  Appendix is normal. No free fluid, free air or adenopathy. Aorta and iliac vessels are calcified, non aneurysmal. No acute bony abnormality or focal bone lesion. IMPRESSION: Bilateral nephrolithiasis.  If no hydronephrosis or ureteral stones. Left colonic diverticulosis. Electronically Signed   By: Rolm Baptise M.D.   On: 04/08/2015 09:49     Assessment: 59yo with bilateral renal calculi and right pyelonephritis.  She is hypotensive this morning.  UCx/ blood cultures are still pending.    Plan:  1. Pyelonephritis: Agree with Rocephin until urine culture is finalized 2. Discussed the management of nonobstructing renal calculi with the patient which  will likely involve a staged ureteroscopic procedure. Explained to the patient that she will need to be on culture specific antibiotics for at least 14 days prior to any intervention. Coordinate scheduling here stone surgery at the time of discharge.  3. No acute urological intervention needed during this admission.  Will follow up as an outpatient.        CC:     LOS: 1 day    [facility] 04/09/2015

## 2015-04-10 LAB — URINE CULTURE
Culture: >=100000
Special Requests: NORMAL

## 2015-04-10 MED ORDER — CIPROFLOXACIN HCL 500 MG PO TABS
500.0000 mg | ORAL_TABLET | Freq: Two times a day (BID) | ORAL | Status: DC
Start: 2015-04-10 — End: 2015-05-02

## 2015-04-10 NOTE — Progress Notes (Signed)
Patient ID: Felicia Manning, female   DOB: 23-Feb-1955, 60 y.o.   MRN: [id number] [place] [facility] [place] was admitted to the Hospital on 04/08/2015 and Discharged  04/10/2015 and should be excused from work/school   for 8  days starting 04/08/2015 , may return to work/school without any restrictions.  Loletha Grayer M.D on 04/10/2015,at 10:05 AM  [place] [facility] [place]

## 2015-04-10 NOTE — Discharge Summary (Signed)
[place] [facility] [place] NAME: Brenlee Koskela    MR#:  [id number]  DATE OF BIRTH:  [birth year]  DATE OF ADMISSION:  04/08/2015 ADMITTING PHYSICIAN: Hillary Bow, MD  DATE OF DISCHARGE: 04/10/2015 11:34 AM  PRIMARY CARE PHYSICIAN: Singh,Jasmine, MD    ADMISSION DIAGNOSIS:  Kidney stone [N20.0] Pyelonephritis [N12]  DISCHARGE DIAGNOSIS:  Active Problems:   UTI (lower urinary tract infection)   Flank pain   Kidney stone   Pyelonephritis   SECONDARY DIAGNOSIS:   Past Medical History  Diagnosis Date  . Cancer [facility]) 2012    thyroid ca  . Hypertension   . Hepatitis C   . Hypothyroidism   . Thyroid cancer ([place])   . GERD (gastroesophageal reflux disease)   . Barrett's esophagus     HOSPITAL COURSE:   1. Suspected pyelonephritis with acute encephalopathy and leukocytosis. Urine culture growing Escherichia coli. Patient received 3 doses of Rocephin while here. Will switch over to by mouth Cipro for 11 more days. Leukocytosis has improved. Mental status improved. 2. Nephrolithiasis urology wanted 2 weeks worth of antibiotics prior to any procedure for the kidney stones. 3. Hypothyroidism unspecified continue levothyroxine 4. Hypertension essential continue metoprolol 5. Gastroesophageal reflux disease and Barrett's esophagus on Protonix  DISCHARGE CONDITIONS:   Satisfactory  CONSULTS OBTAINED:  Treatment Team:  Hollice Espy, MD  DRUG ALLERGIES:  No Known Allergies  DISCHARGE MEDICATIONS:   Discharge Medication List as of 04/10/2015  8:58 AM    START taking these medications   Details  ciprofloxacin (CIPRO) 500 MG tablet Take 1 tablet (500 mg total) by mouth 2 (two) times daily., Starting 04/10/2015, Until Discontinued, Print      CONTINUE these medications which have NOT CHANGED   Details  levothyroxine (SYNTHROID, LEVOTHROID) 150 MCG tablet Take 150 mcg by mouth daily. Take with a full glass of water at least  30 minutes before breakfast, Until Discontinued, Historical Med    metoprolol succinate (TOPROL-XL) 25 MG 24 hr tablet Take 25 mg by mouth daily., Until Discontinued, Historical Med    omeprazole (PRILOSEC) 40 MG capsule Take 40 mg by mouth daily., Until Discontinued, Historical Med      STOP taking these medications     losartan-hydrochlorothiazide (HYZAAR) 50-12.5 MG tablet          DISCHARGE INSTRUCTIONS:   Follow-up with urology as outpatient. Follow-up with PMD 2 weeks  If you experience worsening of your admission symptoms, develop shortness of breath, life threatening emergency, suicidal or homicidal thoughts you must seek medical attention immediately by calling 911 or calling your MD immediately  if symptoms less severe.  You Must read complete instructions/literature along with all the possible adverse reactions/side effects for all the Medicines you take and that have been prescribed to you. Take any new Medicines after you have completely understood and accept all the possible adverse reactions/side effects.   Please note  You were cared for by a hospitalist during your hospital stay. If you have any questions about your discharge medications or the care you received while you were in the hospital after you are discharged, you can call the unit and asked to speak with the hospitalist on call if the hospitalist that took care of you is not available. Once you are discharged, your primary care physician will handle any further medical issues. Please note that NO REFILLS for any discharge medications will be authorized once you are discharged, as it is imperative that you  return to your primary care physician (or establish a relationship with a primary care physician if you do not have one) for your aftercare needs so that they can reassess your need for medications and monitor your lab values.    Today   CHIEF COMPLAINT:   Chief Complaint  Patient presents with  . Flank  Pain    HISTORY OF PRESENT ILLNESS:  Felicia Manning  is a 60 y.o. female with a known history of hypertension presented with altered mental status and flank pain. Found to have a urinary tract infection   VITAL SIGNS:  Blood pressure 146/66, pulse 64, temperature 97.5 F (36.4 C), temperature source Oral, resp. rate 18, height '5\' 7"'$  (1.702 m), weight 87.862 kg (193 lb 11.2 oz), SpO2 98 %.    PHYSICAL EXAMINATION:  GENERAL:  60 y.o.-year-old patient lying in the bed with no acute distress.  EYES: Pupils equal, round, reactive to light and accommodation. No scleral icterus. Extraocular muscles intact.  HEENT: Head atraumatic, normocephalic. Oropharynx and nasopharynx clear.  NECK:  Supple, no jugular venous distention. No thyroid enlargement, no tenderness.  LUNGS: Normal breath sounds bilaterally, no wheezing, rales,rhonchi or crepitation. No use of accessory muscles of respiration.  CARDIOVASCULAR: S1, S2 normal. No murmurs, rubs, or gallops.  ABDOMEN: Soft, non-tender, non-distended. Bowel sounds present. No organomegaly or mass.  EXTREMITIES: No pedal edema, cyanosis, or clubbing.  NEUROLOGIC: Cranial nerves II through XII are intact. Muscle strength 5/5 in all extremities. Sensation intact. Gait not checked.  PSYCHIATRIC: The patient is alert and oriented x 3.  SKIN: No obvious rash, lesion, or ulcer.   DATA REVIEW:   CBC  Recent Labs Lab 04/09/15 0515  WBC 9.7  HGB 12.1  HCT 36.6  PLT 169    Chemistries   Recent Labs Lab 04/08/15 0841 04/09/15 0515  NA 134* 139  K 3.4* 4.1  CL 100* 109  CO2 24 24  GLUCOSE 173* 104*  BUN 23* 17  CREATININE 1.00 0.72  CALCIUM 8.4* 8.0*  AST 18  --   ALT 16  --   ALKPHOS 74  --   BILITOT 1.3*  --     Cardiac Enzymes  Recent Labs Lab 04/08/15 0841  TROPONINI <0.03    Microbiology Results  Results for orders placed or performed during the hospital encounter of 04/08/15  Urine culture     Status: None   Collection  Time: 04/08/15  9:49 AM  Result Value Ref Range Status   Specimen Description URINE, RANDOM  Final   Special Requests Normal  Final   Culture >=100,000 COLONIES/mL ESCHERICHIA COLI  Final   Report Status 04/10/2015 FINAL  Final   Organism ID, Bacteria ESCHERICHIA COLI  Final      Susceptibility   Escherichia coli - MIC*    AMPICILLIN <=2 SENSITIVE Sensitive     CEFAZOLIN <=4 SENSITIVE Sensitive     CEFTRIAXONE <=1 SENSITIVE Sensitive     CIPROFLOXACIN <=0.25 SENSITIVE Sensitive     GENTAMICIN <=1 SENSITIVE Sensitive     IMIPENEM <=0.25 SENSITIVE Sensitive     NITROFURANTOIN <=16 SENSITIVE Sensitive     TRIMETH/SULFA <=20 SENSITIVE Sensitive     AMPICILLIN/SULBACTAM <=2 SENSITIVE Sensitive     PIP/TAZO <=4 SENSITIVE Sensitive     Extended ESBL NEGATIVE Sensitive     * >=100,000 COLONIES/mL ESCHERICHIA COLI  Blood culture (routine x 2)     Status: None (Preliminary result)   Collection Time: 04/08/15  9:50 AM  Result Value  Ref Range Status   Specimen Description BLOOD LEFT AC  Final   Special Requests   Final    BOTTLES DRAWN AEROBIC AND ANAEROBIC AER 6ML ANA 3ML   Culture NO GROWTH 2 DAYS  Final   Report Status PENDING  Incomplete  Blood culture (routine x 2)     Status: None (Preliminary result)   Collection Time: 04/08/15  9:55 AM  Result Value Ref Range Status   Specimen Description BLOOD RIGHT AC  Final   Special Requests   Final    BOTTLES DRAWN AEROBIC AND ANAEROBIC AER 7ML ANA 3ML   Culture NO GROWTH 2 DAYS  Final   Report Status PENDING  Incomplete    Management plans discussed with the patient, family and they are in agreement.  CODE STATUS:  Code Status History    Date Active Date Inactive Code Status Order ID Comments User Context   04/08/2015 11:16 AM 04/10/2015  2:35 PM Full Code [id number]  Hillary Bow, MD ED      TOTAL TIME TAKING CARE OF THIS PATIENT: 35 minutes.    Loletha Grayer M.D on 04/10/2015 at 4:24 PM  Between 7am to 6pm - Pager -  [phone]  After 6pm go to [email or web address] - password [email or web address] [place] [facility]  Office  [phone]  CC: Primary care physician; Glendon Axe, MD

## 2015-04-10 NOTE — Discharge Instructions (Signed)
Kidney Stones Kidney stones (urolithiasis) are solid masses that form inside your kidneys. The intense pain is caused by the stone moving through the kidney, ureter, bladder, and urethra (urinary tract). When the stone moves, the ureter starts to spasm around the stone. The stone is usually passed in your pee (urine).  HOME CARE  Drink enough fluids to keep your pee clear or pale yellow. This helps to get the stone out.  Take a 24-hour pee (urine) sample as told by your doctor. You may need to take another sample every 6-12 months.  Strain all pee through the provided strainer. Do not pee without peeing through the strainer, not even once. If you pee the stone out, catch it in the strainer. The stone may be as small as a grain of salt. Take this to your doctor. This will help your doctor figure out what you can do to try to prevent more kidney stones.  Only take medicine as told by your doctor.  Make changes to your daily diet as told by your doctor. You may be told to:  Limit how much salt you eat.  Eat 5 or more servings of fruits and vegetables each day.  Limit how much meat, poultry, fish, and eggs you eat.  Keep all follow-up visits as told by your doctor. This is important.  Get follow-up X-rays as told by your doctor. GET HELP IF: You have pain that gets worse even if you have been taking pain medicine. GET HELP RIGHT AWAY IF:   Your pain does not get better with medicine.  You have a fever or shaking chills.  Your pain increases and gets worse over 18 hours.  You have new belly (abdominal) pain.  You feel faint or pass out.  You are unable to pee.   This information is not intended to replace advice given to you by your health care provider. Make sure you discuss any questions you have with your health care provider.   Document Released: 07/21/2007 Document Revised: 10/23/2014 Document Reviewed: 07/05/2012 Elsevier Interactive Patient Education 2016 Elsevier  Inc.  Pyelonephritis, Adult Pyelonephritis is a kidney infection. The kidneys are the organs that filter a person's blood and move waste out of the bloodstream and into the urine. Urine passes from the kidneys, through the ureters, and into the bladder. There are two main types of pyelonephritis:  Infections that come on quickly without any warning (acute pyelonephritis).  Infections that last for a long period of time (chronic pyelonephritis). In most cases, the infection clears up with treatment and does not cause further problems. More severe infections or chronic infections can sometimes spread to the bloodstream or lead to other problems with the kidneys. CAUSES This condition is usually caused by:  Bacteria traveling from the bladder to the kidney through infected urine. The urine in the bladder can become infected with bacteria from:  Bladder infection (cystitis).  Inflammation of the prostate gland (prostatitis).  Sexual intercourse, in females.  Bacteria traveling from the bloodstream to the kidney. RISK FACTORS This condition is more likely to develop in:  Pregnant women.  Older people.  People who have diabetes.  People who have kidney stones or bladder stones.  People who have other abnormalities of the kidney or ureter.  People who have a catheter placed in the bladder.  People who have cancer.  People who are sexually active.  Women who use spermicides.  People who have had a prior urinary tract infection. SYMPTOMS Symptoms of this  condition include:  Frequent urination.  Strong or persistent urge to urinate.  Burning or stinging when urinating.  Abdominal pain.  Back pain.  Pain in the side or flank area.  Fever.  Chills.  Blood in the urine, or dark urine.  Nausea.  Vomiting. DIAGNOSIS This condition may be diagnosed based on:  Medical history and physical exam.  Urine tests.  Blood tests. You may also have imaging tests of  the kidneys, such as an ultrasound or CT scan. TREATMENT Treatment for this condition may depend on the severity of the infection.  If the infection is mild and is found early, you may be treated with antibiotic medicines taken by mouth. You will need to drink fluids to remain hydrated.  If the infection is more severe, you may need to stay in the hospital and receive antibiotics given directly into a vein through an IV tube. You may also need to receive fluids through an IV tube if you are not able to remain hydrated. After your hospital stay, you may need to take oral antibiotics for a period of time. Other treatments may be required, depending on the cause of the infection. HOME CARE INSTRUCTIONS Medicines  Take over-the-counter and prescription medicines only as told by your health care provider.  If you were prescribed an antibiotic medicine, take it as told by your health care provider. Do not stop taking the antibiotic even if you start to feel better. General Instructions  Drink enough fluid to keep your urine clear or pale yellow.  Avoid caffeine, tea, and carbonated beverages. They tend to irritate the bladder.  Urinate often. Avoid holding in urine for long periods of time.  Urinate before and after sex.  After a bowel movement, women should cleanse from front to back. Use each tissue only once.  Keep all follow-up visits as told by your health care provider. This is important. SEEK MEDICAL CARE IF:  Your symptoms do not get better after 2 days of treatment.  Your symptoms get worse.  You have a fever. SEEK IMMEDIATE MEDICAL CARE IF:  You are unable to take your antibiotics or fluids.  You have shaking chills.  You vomit.  You have severe flank or back pain.  You have extreme weakness or fainting.   This information is not intended to replace advice given to you by your health care provider. Make sure you discuss any questions you have with your health care  provider.   Document Released: 02/01/2005 Document Revised: 10/23/2014 Document Reviewed: 05/27/2014 Elsevier Interactive Patient Education [employer].

## 2015-04-11 ENCOUNTER — Ambulatory Visit
Admission: RE | Admit: 2015-04-11 | Discharge: 2015-04-11 | Disposition: A | Discharge status: Home / Self Care | Payer: Managed Care, Other (non HMO) | Source: Ambulatory Visit | Attending: Student | Admitting: Student

## 2015-04-11 DIAGNOSIS — B182 Chronic viral hepatitis C: Secondary | ICD-10-CM | POA: Insufficient documentation

## 2015-04-11 DIAGNOSIS — N281 Cyst of kidney, acquired: Secondary | ICD-10-CM | POA: Diagnosis not present

## 2015-04-13 LAB — CULTURE, BLOOD (ROUTINE X 2)
Culture: NO GROWTH
Culture: NO GROWTH

## 2015-04-22 ENCOUNTER — Ambulatory Visit
Admission: RE | Admit: 2015-04-22 | Discharge: 2015-04-22 | Disposition: A | Discharge status: Home / Self Care | Payer: Managed Care, Other (non HMO) | Source: Ambulatory Visit | Attending: Student | Admitting: Student

## 2015-04-22 DIAGNOSIS — B182 Chronic viral hepatitis C: Secondary | ICD-10-CM | POA: Diagnosis present

## 2015-04-24 ENCOUNTER — Encounter: Payer: Self-pay | Admitting: *Deleted

## 2015-04-25 ENCOUNTER — Encounter: Payer: Self-pay | Admitting: Anesthesiology

## 2015-04-25 ENCOUNTER — Ambulatory Visit: Payer: Managed Care, Other (non HMO) | Admitting: Anesthesiology

## 2015-04-25 ENCOUNTER — Ambulatory Visit
Admission: RE | Admit: 2015-04-25 | Discharge: 2015-04-25 | Disposition: A | Discharge status: Home / Self Care | Payer: Managed Care, Other (non HMO) | Source: Ambulatory Visit | Attending: Gastroenterology | Admitting: Gastroenterology

## 2015-04-25 ENCOUNTER — Encounter
Admission: RE | Disposition: A | Discharge status: Home / Self Care | Payer: Self-pay | Source: Ambulatory Visit | Attending: Gastroenterology

## 2015-04-25 DIAGNOSIS — K5520 Angiodysplasia of colon without hemorrhage: Secondary | ICD-10-CM | POA: Insufficient documentation

## 2015-04-25 DIAGNOSIS — K5730 Diverticulosis of large intestine without perforation or abscess without bleeding: Secondary | ICD-10-CM | POA: Insufficient documentation

## 2015-04-25 DIAGNOSIS — B1920 Unspecified viral hepatitis C without hepatic coma: Secondary | ICD-10-CM | POA: Diagnosis not present

## 2015-04-25 DIAGNOSIS — J449 Chronic obstructive pulmonary disease, unspecified: Secondary | ICD-10-CM | POA: Insufficient documentation

## 2015-04-25 DIAGNOSIS — F17210 Nicotine dependence, cigarettes, uncomplicated: Secondary | ICD-10-CM | POA: Diagnosis not present

## 2015-04-25 DIAGNOSIS — K219 Gastro-esophageal reflux disease without esophagitis: Secondary | ICD-10-CM | POA: Insufficient documentation

## 2015-04-25 DIAGNOSIS — Z1211 Encounter for screening for malignant neoplasm of colon: Secondary | ICD-10-CM | POA: Insufficient documentation

## 2015-04-25 DIAGNOSIS — E039 Hypothyroidism, unspecified: Secondary | ICD-10-CM | POA: Insufficient documentation

## 2015-04-25 DIAGNOSIS — K562 Volvulus: Secondary | ICD-10-CM | POA: Insufficient documentation

## 2015-04-25 DIAGNOSIS — K2270 Barrett's esophagus without dysplasia: Secondary | ICD-10-CM | POA: Diagnosis present

## 2015-04-25 DIAGNOSIS — K449 Diaphragmatic hernia without obstruction or gangrene: Secondary | ICD-10-CM | POA: Diagnosis not present

## 2015-04-25 DIAGNOSIS — Z85850 Personal history of malignant neoplasm of thyroid: Secondary | ICD-10-CM | POA: Diagnosis not present

## 2015-04-25 DIAGNOSIS — I10 Essential (primary) hypertension: Secondary | ICD-10-CM | POA: Insufficient documentation

## 2015-04-25 DIAGNOSIS — D123 Benign neoplasm of transverse colon: Secondary | ICD-10-CM | POA: Insufficient documentation

## 2015-04-25 DIAGNOSIS — Z79899 Other long term (current) drug therapy: Secondary | ICD-10-CM | POA: Diagnosis not present

## 2015-04-25 HISTORY — PX: COLONOSCOPY WITH PROPOFOL: SHX5780

## 2015-04-25 HISTORY — PX: ESOPHAGOGASTRODUODENOSCOPY (EGD) WITH PROPOFOL: SHX5813

## 2015-04-25 SURGERY — COLONOSCOPY WITH PROPOFOL
Anesthesia: General

## 2015-04-25 MED ORDER — IPRATROPIUM-ALBUTEROL 0.5-2.5 (3) MG/3ML IN SOLN
RESPIRATORY_TRACT | Status: AC
  Filled 2015-04-25: qty 3

## 2015-04-25 MED ORDER — MIDAZOLAM HCL 5 MG/5ML IJ SOLN
INTRAMUSCULAR | Status: DC | PRN
  Administered 2015-04-25: 1 mg via INTRAVENOUS

## 2015-04-25 MED ORDER — LIDOCAINE HCL (PF) 2 % IJ SOLN
INTRAMUSCULAR | Status: DC | PRN
  Administered 2015-04-25: 60 mg

## 2015-04-25 MED ORDER — PROPOFOL 500 MG/50ML IV EMUL
INTRAVENOUS | Status: DC | PRN
  Administered 2015-04-25: 100 ug/kg/min via INTRAVENOUS

## 2015-04-25 MED ORDER — PHENYLEPHRINE HCL 10 MG/ML IJ SOLN
INTRAMUSCULAR | Status: DC | PRN
  Administered 2015-04-25: 100 ug via INTRAVENOUS

## 2015-04-25 MED ORDER — PROPOFOL 10 MG/ML IV BOLUS
INTRAVENOUS | Status: DC | PRN
  Administered 2015-04-25: 20 mg via INTRAVENOUS
  Administered 2015-04-25: 30 mg via INTRAVENOUS

## 2015-04-25 MED ORDER — IPRATROPIUM-ALBUTEROL 0.5-2.5 (3) MG/3ML IN SOLN
3.0000 mL | Freq: Once | RESPIRATORY_TRACT | Status: AC
  Administered 2015-04-25: 3 mL via RESPIRATORY_TRACT

## 2015-04-25 MED ORDER — SODIUM CHLORIDE 0.9 % IV SOLN
INTRAVENOUS | Status: DC
  Administered 2015-04-25: 11:00:00 via INTRAVENOUS
  Administered 2015-04-25: 1000 mL via INTRAVENOUS

## 2015-04-25 MED ORDER — FENTANYL CITRATE (PF) 100 MCG/2ML IJ SOLN
INTRAMUSCULAR | Status: DC | PRN
  Administered 2015-04-25: 50 ug via INTRAVENOUS

## 2015-04-25 MED ORDER — GLYCOPYRROLATE 0.2 MG/ML IJ SOLN
INTRAMUSCULAR | Status: DC | PRN
  Administered 2015-04-25: 0.2 mg via INTRAVENOUS

## 2015-04-25 NOTE — Transfer of Care (Signed)
Immediate Anesthesia Transfer of Care Note  Patient: Felicia Manning  Procedure(s) Performed: Procedure(s): COLONOSCOPY WITH PROPOFOL (N/A) ESOPHAGOGASTRODUODENOSCOPY (EGD) WITH PROPOFOL (N/A)  Patient Location: PACU  Anesthesia Type:General  Level of Consciousness: sedated  Airway & Oxygen Therapy: Patient Spontanous Breathing and Patient connected to nasal cannula oxygen  Post-op Assessment: Report given to RN and Post -op Vital signs reviewed and stable  Post vital signs: Reviewed and stable  Last Vitals:  Filed Vitals:   04/25/15 0912  BP: 153/76  Pulse: 76  Temp: 36.3 C  Resp: 16    Complications: No apparent anesthesia complications

## 2015-04-25 NOTE — Anesthesia Postprocedure Evaluation (Signed)
Anesthesia Post Note  Patient: [name]  Procedure(s) Performed: Procedure(s) (LRB): COLONOSCOPY WITH PROPOFOL (N/A) ESOPHAGOGASTRODUODENOSCOPY (EGD) WITH PROPOFOL (N/A)  Patient location during evaluation: Endoscopy Anesthesia Type: General Level of consciousness: awake and alert Pain management: pain level controlled Vital Signs Assessment: post-procedure vital signs reviewed and stable Respiratory status: spontaneous breathing, nonlabored ventilation, respiratory function stable and patient connected to nasal cannula oxygen Cardiovascular status: blood pressure returned to baseline and stable Postop Assessment: no signs of nausea or vomiting Anesthetic complications: no    Last Vitals:  Filed Vitals:   04/25/15 1130 04/25/15 1140  BP: 150/83 155/82  Pulse: 62 64  Temp:    Resp: 14 11    Last Pain: There were no vitals filed for this visit.               Precious Haws Liyla Radliff

## 2015-04-25 NOTE — H&P (Signed)
  Primary Care Physician:  Glendon Axe, MD  Pre-Procedure History & Physical: HPI:  Felicia Manning is a 60 y.o. female is here for an endoscopy / colonoscopy   Past Medical History  Diagnosis Date  . Cancer [facility]) 2012    thyroid ca  . Hypertension   . Hepatitis C   . Hypothyroidism   . Thyroid cancer ([place])   . GERD (gastroesophageal reflux disease)   . Barrett's esophagus     History reviewed. No pertinent past surgical history.  Prior to Admission medications   Medication Sig Start Date End Date Taking? Authorizing Provider  levothyroxine (SYNTHROID, LEVOTHROID) 150 MCG tablet Take 150 mcg by mouth daily. Take with a full glass of water at least 30 minutes before breakfast   Yes Historical Provider, MD  losartan-hydrochlorothiazide (HYZAAR) 50-12.5 MG tablet Take 1 tablet by mouth daily.   Yes Historical Provider, MD  metoprolol succinate (TOPROL-XL) 25 MG 24 hr tablet Take 25 mg by mouth daily.   Yes Historical Provider, MD  omeprazole (PRILOSEC) 40 MG capsule Take 40 mg by mouth daily.   Yes Historical Provider, MD  ciprofloxacin (CIPRO) 500 MG tablet Take 1 tablet (500 mg total) by mouth 2 (two) times daily. Patient not taking: Reported on 04/25/2015 04/10/15   Loletha Grayer, MD    Allergies as of 04/15/2015  . (No Known Allergies)    Family History  Problem Relation Age of Onset  . CAD Mother     Social History   Social History  . Marital Status: Single    Spouse Name: N/A  . Number of Children: N/A  . Years of Education: N/A   Occupational History  . Not on file.   Social History Main Topics  . Smoking status: Current Every Day Smoker -- 1.00 packs/day    Types: Cigarettes  . Smokeless tobacco: Never Used  . Alcohol Use: Yes  . Drug Use: No  . Sexual Activity: Not on file   Other Topics Concern  . Not on file   Social History Narrative     Physical Exam: BP 153/76 mmHg  Pulse 76  Temp(Src) 97.4 F (36.3 C) (Tympanic)  Resp 16  Ht '5\' 7"'$   (1.702 m)  Wt 86.183 kg (190 lb)  BMI 29.75 kg/m2  SpO2 96% General:   Alert,  pleasant and cooperative in NAD Head:  Normocephalic and atraumatic. Neck:  Supple; no masses or thyromegaly. Lungs:  Clear throughout to auscultation.    Heart:  Regular rate and rhythm. Abdomen:  Soft, nontender and nondistended. Normal bowel sounds, without guarding, and without rebound.   Neurologic:  Alert and  oriented x4;  grossly normal neurologically.  Impression/Plan: Felicia Manning is here for an endoscopy to be performed for Barret's esophagus, colonoscopy for hx colon polyps  Risks, benefits, limitations, and alternatives regarding  Endoscopy/colonoscoopy have been reviewed with the patient.  Questions have been answered.  All parties agreeable.   Josefine Class, MD  04/25/2015, 10:09 AM

## 2015-04-25 NOTE — Op Note (Signed)
[facility] Patient Name: Felicia Manning Procedure Date: 04/25/2015 10:10 AM MRN: [id number] Account #: [id number] Date of Birth: [birth year] Admit Type: Outpatient Age: 60 Room: [facility] ENDO ROOM 4 Gender: Female Note Status: Finalized Procedure:            Upper GI endoscopy Indications:          Follow-up of Barrett's esophagus Patient Profile:      This is a 60 year old female. Providers:            Gerrit Heck. Rayann Heman, MD Referring MD:         Glendon Axe (Referring MD) Medicines:            Propofol per Anesthesia Complications:        No immediate complications. Procedure:            Pre-Anesthesia Assessment:                       - Prior to the procedure, a History and Physical was                        performed, and patient medications, allergies and                        sensitivities were reviewed. The patient's tolerance of                        previous anesthesia was reviewed.                       After obtaining informed consent, the endoscope was                        passed under direct vision. Throughout the procedure,                        the patient's blood pressure, pulse, and oxygen                        saturations were monitored continuously. The Endoscope                        was introduced through the mouth, and advanced to the                        second part of duodenum. The upper GI endoscopy was                        accomplished without difficulty. The patient tolerated                        the procedure well. Findings:      A small hiatal hernia was present.      The Z-line was irregular and was found at the gastroesophageal junction.       Biopsies were taken with a cold forceps for histology.      A single 4 mm no bleeding angioectasia was found in the gastric body.      The examined duodenum was normal. Impression:           - Small hiatal hernia.                       -  Z-line irregular, at the  gastroesophageal junction.                        Biopsied.                       - A single non-bleeding angioectasia in the stomach.                       - Normal examined duodenum. Recommendation:       - Await pathology results.                       - Continue present medications.                       - Perform a colonoscopy today. Procedure Code(s):    --- Professional ---                       [phone], Esophagogastroduodenoscopy, flexible, transoral;                        with biopsy, single or multiple Diagnosis Code(s):    --- Professional ---                       K22.8, Other specified diseases of esophagus                       K31.819, Angiodysplasia of stomach and duodenum without                        bleeding                       K22.70, Barrett's esophagus without dysplasia CPT copyright 2016 American Medical Association. All rights reserved. The codes documented in this report are preliminary and upon coder review may  be revised to meet current compliance requirements. Mellody Life, MD 04/25/2015 10:29:51 AM This report has been signed electronically. Number of Addenda: 0 Note Initiated On: 04/25/2015 10:10 AM      [facility]

## 2015-04-25 NOTE — Anesthesia Preprocedure Evaluation (Addendum)
Anesthesia Evaluation  Patient identified by MRN, date of birth, ID band Patient awake    Reviewed: Allergy & Precautions, H&P , NPO status , Patient's Chart, lab work & pertinent test results  History of Anesthesia Complications Negative for: history of anesthetic complications  Airway Mallampati: III  TM Distance: >3 FB Neck ROM: limited    Dental  (+) Poor Dentition, Missing, Edentulous Upper, Edentulous Lower   Pulmonary shortness of breath and with exertion, COPD, Current Smoker,    Pulmonary exam normal breath sounds clear to auscultation       Cardiovascular Exercise Tolerance: Good hypertension, (-) angina+ DOE  (-) Past MI Normal cardiovascular exam Rhythm:regular Rate:Normal     Neuro/Psych negative neurological ROS  negative psych ROS   GI/Hepatic GERD  Controlled,(+) Hepatitis -, C  Endo/Other  Hypothyroidism   Renal/GU Renal disease  negative genitourinary   Musculoskeletal   Abdominal   Peds  Hematology negative hematology ROS (+)   Anesthesia Other Findings Past Medical History:   Cancer ([place])                                    2012           Comment:thyroid ca   Hypertension                                                 Hepatitis C                                                  Hypothyroidism                                               Thyroid cancer ([place])                                         GERD (gastroesophageal reflux disease)                       Barrett's esophagus                                         History reviewed. No pertinent surgical history.  BMI    Body Mass Index   29.75 kg/m 2      Reproductive/Obstetrics negative OB ROS                             Anesthesia Physical Anesthesia Plan  ASA: III  Anesthesia Plan: General   Post-op Pain Management:    Induction:   Airway Management Planned:   Additional Equipment:    Intra-op Plan:   Post-operative Plan:   Informed Consent: I have reviewed the patients History and Physical, chart, labs and discussed the procedure including the risks, benefits  and alternatives for the proposed anesthesia with the patient or authorized representative who has indicated his/her understanding and acceptance.   Dental Advisory Given  Plan Discussed with: Anesthesiologist, CRNA and Surgeon  Anesthesia Plan Comments:         Anesthesia Quick Evaluation

## 2015-04-25 NOTE — Op Note (Signed)
[facility] Patient Name: Felicia Manning Procedure Date: 04/25/2015 10:09 AM MRN: [id number] Account #: [id number] Date of Birth: 01/27/1956 Admit Type: Outpatient Age: 60 Room: [facility] ENDO ROOM 4 Gender: Female Note Status: Finalized Procedure:            Colonoscopy Indications:          High risk colon cancer surveillance: Personal history                        of colonic polyps, Last colonoscopy 10 years ago Providers:            Gerrit Heck. Rayann Heman, MD Referring MD:         Glendon Axe (Referring MD) Medicines:            Propofol per Anesthesia Complications:        No immediate complications. Procedure:            Pre-Anesthesia Assessment:                       - Prior to the procedure, a History and Physical was                        performed, and patient medications, allergies and                        sensitivities were reviewed. The patient's tolerance of                        previous anesthesia was reviewed.                       After obtaining informed consent, the colonoscope was                        passed under direct vision. Throughout the procedure,                        the patient's blood pressure, pulse, and oxygen                        saturations were monitored continuously. The Olympus                        PCF-H180AL colonoscope ( S#: [id number] ) was introduced                        through the anus and advanced to the the hepatic                        flexure. The patient tolerated the procedure well. The                        colonoscopy was extremely difficult due to significant                        looping, a tortuous colon and the patient's body                        habitus. Successful completion of the procedure was  aided by using manual pressure. The patient tolerated                        the procedure well. The quality of the bowel                        preparation was  good. Findings:      The perianal and digital rectal examinations were normal.      A 8 mm polyp was found in the transverse colon. The polyp was sessile.       The polyp was removed with a hot snare. Resection and retrieval were       complete.      Many small and large-mouthed diverticula were found in the sigmoid       colon, descending colon and transverse colon.      The exam was otherwise without abnormality on direct and retroflexion       views.      - Unable to advance past HF due to severe looping and tortuous colon       despite pressure and position changes. Impression:           - One 8 mm polyp in the transverse colon, removed with                        a hot snare. Resected and retrieved.                       - Diverticulosis in the sigmoid colon, in the                        descending colon and in the transverse colon.                       - The examination was otherwise normal on direct and                        retroflexion views.                       - Unable to advance past HF due to severe looping and                        tortuous colon despite pressure and position changes. Recommendation:       - Observe patient in GI recovery unit.                       - High fiber diet.                       - Continue present medications.                       - Await pathology results.                       - Perform a virtual colonoscopy at appointment to be                        scheduled.                       -  The findings and recommendations were discussed with                        the patient.                       - The findings and recommendations were discussed with                        the patient's family. Procedure Code(s):    --- Professional ---                       [phone], 20, Colonoscopy, flexible; with removal of                        tumor(s), polyp(s), or other lesion(s) by snare                        technique Diagnosis Code(s):    ---  Professional ---                       Z86.010, Personal history of colonic polyps                       D12.3, Benign neoplasm of transverse colon (hepatic                        flexure or splenic flexure)                       K57.30, Diverticulosis of large intestine without                        perforation or abscess without bleeding CPT copyright 2016 American Medical Association. All rights reserved. The codes documented in this report are preliminary and upon coder review may  be revised to meet current compliance requirements. Mellody Life, MD 04/25/2015 11:11:08 AM This report has been signed electronically. Number of Addenda: 0 Note Initiated On: 04/25/2015 10:09 AM Total Procedure Duration: 0 hours 35 minutes 34 seconds       [facility]

## 2015-04-25 NOTE — Discharge Instructions (Signed)

## 2015-04-26 ENCOUNTER — Encounter: Payer: Self-pay | Admitting: Gastroenterology

## 2015-04-28 LAB — SURGICAL PATHOLOGY

## 2015-05-02 ENCOUNTER — Encounter: Payer: Self-pay | Admitting: Urology

## 2015-05-02 ENCOUNTER — Ambulatory Visit (INDEPENDENT_AMBULATORY_CARE_PROVIDER_SITE_OTHER): Payer: Managed Care, Other (non HMO) | Admitting: Urology

## 2015-05-02 VITALS — BP 142/89 | HR 75 | Ht 67.0 in | Wt 190.0 lb

## 2015-05-02 DIAGNOSIS — E039 Hypothyroidism, unspecified: Secondary | ICD-10-CM | POA: Insufficient documentation

## 2015-05-02 DIAGNOSIS — K219 Gastro-esophageal reflux disease without esophagitis: Secondary | ICD-10-CM | POA: Insufficient documentation

## 2015-05-02 DIAGNOSIS — N200 Calculus of kidney: Secondary | ICD-10-CM

## 2015-05-02 DIAGNOSIS — I10 Essential (primary) hypertension: Secondary | ICD-10-CM | POA: Insufficient documentation

## 2015-05-02 DIAGNOSIS — Z87440 Personal history of urinary (tract) infections: Secondary | ICD-10-CM

## 2015-05-02 DIAGNOSIS — C73 Malignant neoplasm of thyroid gland: Secondary | ICD-10-CM | POA: Insufficient documentation

## 2015-05-02 DIAGNOSIS — B1920 Unspecified viral hepatitis C without hepatic coma: Secondary | ICD-10-CM | POA: Insufficient documentation

## 2015-05-02 DIAGNOSIS — K2270 Barrett's esophagus without dysplasia: Secondary | ICD-10-CM | POA: Insufficient documentation

## 2015-05-02 DIAGNOSIS — D72829 Elevated white blood cell count, unspecified: Secondary | ICD-10-CM | POA: Insufficient documentation

## 2015-05-02 DIAGNOSIS — Z87448 Personal history of other diseases of urinary system: Secondary | ICD-10-CM

## 2015-05-02 DIAGNOSIS — E785 Hyperlipidemia, unspecified: Secondary | ICD-10-CM | POA: Insufficient documentation

## 2015-05-02 NOTE — Progress Notes (Signed)
05/02/2015 10:34 AM   Felicia Manning 25-Sep-1955 [id number]  Referring provider: Glendon Axe, MD [facility] [place], [place] [address]  Chief Complaint  Patient presents with  . Nephrolithiasis    New Patient    HPI:  60 year old female who presented to the emergency room on 04/08/2015 with 3 days of fever, flank pain, and a positive UA. She had multiple nonobstructing kidney stones, right greater than left. No obstructing stones. She was admitted and treated for pyelonephritis.  Urine culture ultimately grew pansensitive Escherichia coli.  She does have a personal history of stones which have required surgical mention on multiple occasions including ureteroscopy and ESWL. Her last intervention prior to stent placement was 3-4 years ago.  Over the past year, she's had intermittent right flank pain and pressure.  She returns today to discuss definitive management of her non-obstructing stones.  PMH: Past Medical History  Diagnosis Date  . Thyroid cancer ([place]) 2012  . Hypertension   . Hepatitis C   . Hypothyroidism   . Thyroid cancer ([place])   . GERD (gastroesophageal reflux disease)   . Barrett's esophagus   . Nephrolithiasis     Surgical History: Past Surgical History  Procedure Laterality Date  . Colonoscopy with propofol N/A 04/25/2015    Procedure: COLONOSCOPY WITH PROPOFOL;  Surgeon: Josefine Class, MD;  Location: [facility] ENDOSCOPY;  Service: Endoscopy;  Laterality: N/A;  . Esophagogastroduodenoscopy (egd) with propofol N/A 04/25/2015    Procedure: ESOPHAGOGASTRODUODENOSCOPY (EGD) WITH PROPOFOL;  Surgeon: Josefine Class, MD;  Location: [facility] ENDOSCOPY;  Service: Endoscopy;  Laterality: N/A;  . Thyroidectomy  2010  . Abdominal hysterectomy  2006  . Lithotripsy  2009    Home Medications:    Medication List       This list is accurate as of: 05/02/15 10:34 AM.  Always use your most recent med list.               levothyroxine 150 MCG tablet  Commonly known as:  SYNTHROID, LEVOTHROID  Take 150 mcg by mouth daily. Take with a full glass of water at least 30 minutes before breakfast     losartan 50 MG tablet  Commonly known as:  COZAAR  TAKE 1 TABLET (50 MG TOTAL) BY MOUTH ONCE DAILY.     metoprolol succinate 25 MG 24 hr tablet  Commonly known as:  TOPROL-XL  Take 25 mg by mouth daily.     omeprazole 40 MG capsule  Commonly known as:  PRILOSEC  Take 40 mg by mouth daily.        Allergies: No Known Allergies  Family History: Family History  Problem Relation Age of Onset  . CAD Mother   . Bladder Cancer Neg Hx   . Prostate cancer Neg Hx   . Kidney cancer Neg Hx     Social History:  reports that she has been smoking Cigarettes.  She has been smoking about 1.00 pack per day. She has never used smokeless tobacco. She reports that she drinks alcohol. She reports that she does not use illicit drugs.  ROS: UROLOGY Frequent Urination?: Yes Hard to postpone urination?: Yes Burning/pain with urination?: No Get up at night to urinate?: Yes Leakage of urine?: Yes Urine stream starts and stops?: No Trouble starting stream?: No Do you have to strain to urinate?: No Blood in urine?: No Urinary tract infection?: Yes Sexually transmitted disease?: No Injury to kidneys or bladder?: No Painful intercourse?: No Weak stream?: No Currently  pregnant?: No Vaginal bleeding?: No Last menstrual period?: hysterectomy  Gastrointestinal Nausea?: No Vomiting?: No Indigestion/heartburn?: No Diarrhea?: No Constipation?: No  Constitutional Fever: No Night sweats?: No Weight loss?: No Fatigue?: No  Skin Skin rash/lesions?: No Itching?: No  Eyes Blurred vision?: No Double vision?: No  Ears/Nose/Throat Sore throat?: No Sinus problems?: No  Hematologic/Lymphatic Swollen glands?: No Easy bruising?: No  Cardiovascular Leg swelling?: No Chest pain?: No  Respiratory Cough?:  Yes Shortness of breath?: No  Endocrine Excessive thirst?: No  Musculoskeletal Back pain?: Yes Joint pain?: No  Neurological Headaches?: Yes Dizziness?: No  Psychologic Depression?: No Anxiety?: No  Physical Exam: BP 142/89 mmHg  Pulse 75  Ht '5\' 7"'$  (1.702 m)  Wt 190 lb (86.183 kg)  BMI 29.75 kg/m2  Constitutional:  Alert and oriented, No acute distress. HEENT: [place] AT, moist mucus membranes.  Trachea midline, no masses. Cardiovascular: No clubbing, cyanosis, or edema. RRR. Respiratory: Normal respiratory effort, no increased work of breathing. CTAB. GI: Abdomen is soft, nontender, nondistended, no abdominal masses GU: No CVA tenderness.  Skin: No rashes, bruises or suspicious lesions. Lymph: No cervical or inguinal adenopathy. Neurologic: Grossly intact, no focal deficits, moving all 4 extremities. Psychiatric: Normal mood and affect.  Laboratory Data: Lab Results  Component Value Date   WBC 9.7 04/09/2015   HGB 12.1 04/09/2015   HCT 36.6 04/09/2015   MCV 87.9 04/09/2015   PLT 169 04/09/2015    Lab Results  Component Value Date   CREATININE 0.72 04/09/2015   Urinalysis Results for orders placed or performed in visit on 05/02/15  Microscopic Examination  Result Value Ref Range   WBC, UA 11-30 (A) 0 -  5 /hpf   RBC, UA 3-10 (A) 0 -  2 /hpf   Epithelial Cells (non renal) 0-10 0 - 10 /hpf   Bacteria, UA None seen None seen/Few  Urinalysis, Complete  Result Value Ref Range   Specific Gravity, UA 1.015 1.005 - 1.030   pH, UA 5.5 5.0 - 7.5   Color, UA Yellow Yellow   Appearance Ur Hazy (A) Clear   Leukocytes, UA 1+ (A) Negative   Protein, UA Negative Negative/Trace   Glucose, UA Negative Negative   Ketones, UA Negative Negative   RBC, UA Trace (A) Negative   Bilirubin, UA Negative Negative   Urobilinogen, Ur 0.2 0.2 - 1.0 mg/dL   Nitrite, UA Negative Negative   Microscopic Examination See below:     Pertinent Imaging: Study Result     CLINICAL  DATA: Right side flank pain and back pain since Saturday.  EXAM: CT ABDOMEN AND PELVIS WITHOUT CONTRAST  TECHNIQUE: Multidetector CT imaging of the abdomen and pelvis was performed following the standard protocol without IV contrast.  COMPARISON: None.  FINDINGS: Dependent atelectasis or scarring in the lung bases. Heart is normal size. No effusions.  Small low-density lesions centrally within the liver, likely small cyst. Gallbladder, spleen, pancreas, adrenals are unremarkable.  Bilateral nephrolithiasis. No ureteral stones or hydronephrosis bilaterally. Multiple bilateral renal stones, the largest in the left lower pole measuring up to 12 mm.  Multiple right renal cysts. Descending colonic and sigmoid diverticulosis. No active diverticulitis. Stomach and small bowel decompressed, unremarkable. Appendix is normal. No free fluid, free air or adenopathy. Aorta and iliac vessels are calcified, non aneurysmal. No acute bony abnormality or focal bone lesion.  IMPRESSION: Bilateral nephrolithiasis. If no hydronephrosis or ureteral stones.  Left colonic diverticulosis.   Electronically Signed  By: Rolm Baptise M.D.  On: 04/08/2015  09:49    SSD 8.5 cm HFu 820  CT scan personally reviewed today.  Assessment & Plan:    1. Kidney stone Nonobstructing right greater than left stone burden. She has at least 10 stones diffusely throughout her right kidney measuring up to 12 mm in the right lower pole. I recommend starting on the right side to clear her stone burden.  We discussed alternative treatment options including right staged ureteroscopy versus right PCNL. Risks and benefits of each were discussed. I do not feel she is a good candidate for ESWL given her numerous stones.  She is most adjusted in proceeding with stage right ureteroscopy. Obviously that she will need at least 2 procedures on her right side possibly more. We discussed risk of the procedure  including risk of bleeding, infection, damage to surrounding strictures, ureteral injury, stent discomfort, and need for multiple procedures. - Urinalysis, Complete - CULTURE, URINE COMPREHENSIVE  2. History of pyelonephritis Repeat urine culture today for preop.  Reviewed the risk of postoperative fevers/sepsis given history of possibly infected stones - CULTURE, URINE COMPREHENSIVE   Schedule staged R URS, LL, stent   Hollice Espy, MD  [facility] [address] [place], [place] [address] [phone]  I spent 25 min with this patient of which greater than 50% was spent in counseling and coordination of care with the patient.

## 2015-05-03 LAB — URINALYSIS, COMPLETE
Bilirubin, UA: NEGATIVE
Glucose, UA: NEGATIVE
Ketones, UA: NEGATIVE
Nitrite, UA: NEGATIVE
Protein, UA: NEGATIVE
Specific Gravity, UA: 1.015 (ref 1.005–1.030)
Urobilinogen, Ur: 0.2 mg/dL (ref 0.2–1.0)
pH, UA: 5.5 (ref 5.0–7.5)

## 2015-05-03 LAB — MICROSCOPIC EXAMINATION: Bacteria, UA: NONE SEEN

## 2015-05-04 LAB — CULTURE, URINE COMPREHENSIVE

## 2015-05-05 ENCOUNTER — Telehealth: Payer: Self-pay | Admitting: Radiology

## 2015-05-05 ENCOUNTER — Other Ambulatory Visit: Payer: Managed Care, Other (non HMO)

## 2015-05-05 ENCOUNTER — Encounter
Admission: RE | Admit: 2015-05-05 | Discharge: 2015-05-05 | Disposition: A | Discharge status: Home / Self Care | Payer: Managed Care, Other (non HMO) | Source: Ambulatory Visit | Attending: Urology | Admitting: Urology

## 2015-05-05 DIAGNOSIS — Z029 Encounter for administrative examinations, unspecified: Secondary | ICD-10-CM | POA: Insufficient documentation

## 2015-05-05 HISTORY — DX: Cardiac arrhythmia, unspecified: I49.9

## 2015-05-05 NOTE — Pre-Procedure Instructions (Signed)
CLINICAL DATA: Fever  EXAM: CHEST 2 VIEW  COMPARISON: None.  FINDINGS: Bibasilar opacities likely reflects atelectasis. Heart is normal size. No effusions. No acute bony abnormality.  IMPRESSION: Bibasilar atelectasis.  Called Dr. Andree Elk regarding CXR result OK to proceed.

## 2015-05-05 NOTE — Telephone Encounter (Signed)
Notified pt of surgery scheduled 05/19/15, pre-admit testing appt on 3/20 '@11'$ :00 and to call Friday prior to surgery for arrival time to SDS. Pt voices understanding.

## 2015-05-05 NOTE — Pre-Procedure Instructions (Signed)
ED ECG REPORT I, YAO, DAVID, the attending physician, personally viewed and interpreted this ECG.  Date: 04/08/2015 EKG Time: 8:39 Rate: 82 Rhythm: normal EKG, normal sinus rhythm Axis: normal Intervals:none ST&T Change: none

## 2015-05-05 NOTE — Patient Instructions (Signed)
  Your procedure is scheduled on: Monday May 20, 2015 Report to Same Day Surgery. To find out your arrival time please call [phone] between 1PM - 3PM on Friday May 16, 2015.  Remember: Instructions that are not followed completely may result in serious medical risk, up to and including death, or upon the discretion of your surgeon and anesthesiologist your surgery may need to be rescheduled.    _x___ 1. Do not eat food or drink liquids after midnight. No gum chewing or hard candies.     ____ 2. No Alcohol for 24 hours before or after surgery.   ____ 3. Bring all medications with you on the day of surgery if instructed.    __x__ 4. Notify your doctor if there is any change in your medical condition     (cold, fever, infections).     Do not wear jewelry, make-up, hairpins, clips or nail polish.  Do not wear lotions, powders, or perfumes. You may wear deodorant.  Do not shave 48 hours prior to surgery. Men may shave face and neck.  Do not bring valuables to the hospital.    [facility] is not responsible for any belongings or valuables.               Contacts, dentures or bridgework may not be worn into surgery.  Leave your suitcase in the car. After surgery it may be brought to your room.  For patients admitted to the hospital, discharge time is determined by your treatment team.   Patients discharged the day of surgery will not be allowed to drive home.    Please read over the following fact sheets that you were given:   [facility] Preparing for Surgery  _x___ Take these medicines the morning of surgery with A SIP OF WATER:    1. losartan (COZAAR  2. metoprolol succinate (TOPROL-XL)  3. omeprazole (PRILOSEC)   ____ Fleet Enema (as directed)   ____ Use CHG Soap as directed  ____ Use inhalers on the day of surgery  ____ Stop metformin 2 days prior to surgery    ____ Take 1/2 of usual insulin dose the night before surgery and none on the morning of  surgery.    ____ Stop Coumadin/Plavix/aspirin on does not apply.  __x__ Stop Anti-inflammatories on does not apply.  OK to take Tylenol for pain.   ____ Stop supplements until after surgery.    ____ Bring C-Pap to the hospital.

## 2015-05-06 ENCOUNTER — Other Ambulatory Visit: Payer: Managed Care, Other (non HMO)

## 2015-05-13 ENCOUNTER — Other Ambulatory Visit: Payer: Self-pay | Admitting: Urology

## 2015-05-13 ENCOUNTER — Other Ambulatory Visit: Payer: Self-pay

## 2015-05-13 ENCOUNTER — Telehealth: Payer: Self-pay | Admitting: Radiology

## 2015-05-13 DIAGNOSIS — N200 Calculus of kidney: Secondary | ICD-10-CM

## 2015-05-13 MED ORDER — HYDROCODONE-ACETAMINOPHEN 5-325 MG PO TABS: 1.0000 | ORAL_TABLET | Freq: Four times a day (QID) | ORAL | Status: DC | PRN

## 2015-05-13 NOTE — Telephone Encounter (Signed)
Sure, have Larene Beach give her Vicodin #20 tabs to last until surgery.  Hollice Espy, MD

## 2015-05-13 NOTE — Telephone Encounter (Signed)
Pt picked up prescription for Vicodin written by Zara Council per Dr Cherrie Gauze instructions.

## 2015-05-13 NOTE — Telephone Encounter (Signed)
Pt called c/o severe left kidney pain last night. She took Tylenol '1000mg'$  & it took the edge off. She currently c/o moderate pain & took two more Tylenol this am. She requests pain medication. She is scheduled for Right URS, LL & Right stent placement on 05/19/15. Please advise.

## 2015-05-14 ENCOUNTER — Telehealth: Payer: Self-pay | Admitting: Urology

## 2015-05-14 ENCOUNTER — Other Ambulatory Visit: Payer: Managed Care, Other (non HMO)

## 2015-05-14 ENCOUNTER — Ambulatory Visit
Admission: RE | Admit: 2015-05-14 | Discharge: 2015-05-14 | Disposition: A | Discharge status: Home / Self Care | Payer: Managed Care, Other (non HMO) | Source: Ambulatory Visit | Attending: Urology | Admitting: Urology

## 2015-05-14 ENCOUNTER — Telehealth: Payer: Self-pay

## 2015-05-14 DIAGNOSIS — R109 Unspecified abdominal pain: Secondary | ICD-10-CM

## 2015-05-14 DIAGNOSIS — N200 Calculus of kidney: Secondary | ICD-10-CM | POA: Diagnosis present

## 2015-05-14 LAB — MICROSCOPIC EXAMINATION

## 2015-05-14 LAB — URINALYSIS, COMPLETE
Bilirubin, UA: NEGATIVE
Glucose, UA: NEGATIVE
Ketones, UA: NEGATIVE
Nitrite, UA: NEGATIVE
Protein, UA: NEGATIVE
Specific Gravity, UA: 1.02 (ref 1.005–1.030)
Urobilinogen, Ur: 0.2 mg/dL (ref 0.2–1.0)
pH, UA: 5.5 (ref 5.0–7.5)

## 2015-05-14 NOTE — Telephone Encounter (Signed)
-----   Message from Hollice Espy, MD sent at 05/14/2015 12:24 PM EDT ----- Please let this patient know her UA looks good.  No evidence of infection.    Hollice Espy, MD

## 2015-05-14 NOTE — Telephone Encounter (Signed)
Spoke with pt in reference UA. Pt voiced understanding. Pt requested xray results.

## 2015-05-14 NOTE — Telephone Encounter (Signed)
Patient scheduled for R URS but called yesterday complaining of new onset LEFT flank pain.  She does have non obstructing stones on this side as well.  Pain is in her back only and does not wrap around her side like her previously kidney stones pain.    Recommend UA/ UCx to r/o UTI prior to surgery as well as KUB to assess for ureteral stones.  Pending above, may change laterality of surgery.     Hollice Espy, MD

## 2015-05-14 NOTE — Telephone Encounter (Signed)
No ureteral calcification and no infection (please let patient know). We will plan to see some dye up to the left kidney first to assess for any possible abnormality on the side..  If abnormal or concern for stone, will treat this side instead.    Please change booking sheet to add "bilateral retrograde pyelogram" and "possible left URS, LL, stent" but leave the other procedures as they are.    Hollice Espy, MD

## 2015-05-15 NOTE — Telephone Encounter (Signed)
Notified pt of Dr Cherrie Gauze note below. Pt voices understanding. Will change booking sheet as requested.

## 2015-05-16 LAB — CULTURE, URINE COMPREHENSIVE

## 2015-05-19 ENCOUNTER — Telehealth: Payer: Self-pay | Admitting: Urology

## 2015-05-19 ENCOUNTER — Ambulatory Visit
Admission: RE | Admit: 2015-05-19 | Discharge: 2015-05-19 | Disposition: A | Discharge status: Home / Self Care | Payer: Managed Care, Other (non HMO) | Source: Ambulatory Visit | Attending: Urology | Admitting: Urology

## 2015-05-19 ENCOUNTER — Ambulatory Visit: Payer: Managed Care, Other (non HMO) | Admitting: Anesthesiology

## 2015-05-19 ENCOUNTER — Encounter
Admission: RE | Disposition: A | Discharge status: Home / Self Care | Payer: Self-pay | Source: Ambulatory Visit | Attending: Urology

## 2015-05-19 ENCOUNTER — Encounter: Payer: Self-pay | Admitting: *Deleted

## 2015-05-19 DIAGNOSIS — F17210 Nicotine dependence, cigarettes, uncomplicated: Secondary | ICD-10-CM | POA: Diagnosis not present

## 2015-05-19 DIAGNOSIS — E039 Hypothyroidism, unspecified: Secondary | ICD-10-CM | POA: Diagnosis not present

## 2015-05-19 DIAGNOSIS — Z79899 Other long term (current) drug therapy: Secondary | ICD-10-CM | POA: Insufficient documentation

## 2015-05-19 DIAGNOSIS — Z85850 Personal history of malignant neoplasm of thyroid: Secondary | ICD-10-CM | POA: Diagnosis not present

## 2015-05-19 DIAGNOSIS — B1920 Unspecified viral hepatitis C without hepatic coma: Secondary | ICD-10-CM | POA: Insufficient documentation

## 2015-05-19 DIAGNOSIS — K219 Gastro-esophageal reflux disease without esophagitis: Secondary | ICD-10-CM | POA: Diagnosis not present

## 2015-05-19 DIAGNOSIS — N200 Calculus of kidney: Secondary | ICD-10-CM | POA: Diagnosis not present

## 2015-05-19 DIAGNOSIS — I10 Essential (primary) hypertension: Secondary | ICD-10-CM | POA: Diagnosis not present

## 2015-05-19 HISTORY — PX: CYSTOSCOPY W/ RETROGRADES: SHX1426

## 2015-05-19 HISTORY — PX: URETEROSCOPY WITH HOLMIUM LASER LITHOTRIPSY: SHX6645

## 2015-05-19 HISTORY — PX: CYSTOSCOPY WITH STENT PLACEMENT: SHX5790

## 2015-05-19 SURGERY — URETEROSCOPY, WITH LITHOTRIPSY USING HOLMIUM LASER
Anesthesia: General | Laterality: Right | Wound class: Clean Contaminated

## 2015-05-19 MED ORDER — ONDANSETRON HCL 4 MG/2ML IJ SOLN
INTRAMUSCULAR | Status: AC
  Administered 2015-05-19: 4 mg via INTRAVENOUS
  Filled 2015-05-19: qty 2

## 2015-05-19 MED ORDER — GENTAMICIN IN SALINE 1.6-0.9 MG/ML-% IV SOLN
80.0000 mg | Freq: Once | INTRAVENOUS | Status: AC
  Administered 2015-05-19: 80 mg via INTRAVENOUS
  Filled 2015-05-19: qty 50

## 2015-05-19 MED ORDER — TAMSULOSIN HCL 0.4 MG PO CAPS: 0.4000 mg | ORAL_CAPSULE | Freq: Every day | ORAL | Status: DC

## 2015-05-19 MED ORDER — LIDOCAINE HCL (CARDIAC) 20 MG/ML IV SOLN
INTRAVENOUS | Status: DC | PRN
  Administered 2015-05-19: 100 mg via INTRAVENOUS

## 2015-05-19 MED ORDER — ONDANSETRON HCL 4 MG/2ML IJ SOLN
INTRAMUSCULAR | Status: DC | PRN
  Administered 2015-05-19: 4 mg via INTRAVENOUS

## 2015-05-19 MED ORDER — ROCURONIUM BROMIDE 100 MG/10ML IV SOLN
INTRAVENOUS | Status: DC | PRN
  Administered 2015-05-19: 30 mg via INTRAVENOUS

## 2015-05-19 MED ORDER — GLYCOPYRROLATE 0.2 MG/ML IJ SOLN
INTRAMUSCULAR | Status: DC | PRN
  Administered 2015-05-19: 0.6 mg via INTRAVENOUS

## 2015-05-19 MED ORDER — FENTANYL CITRATE (PF) 100 MCG/2ML IJ SOLN
INTRAMUSCULAR | Status: DC | PRN
  Administered 2015-05-19 (×3): 25 ug via INTRAVENOUS
  Administered 2015-05-19: 50 ug via INTRAVENOUS

## 2015-05-19 MED ORDER — GENTAMICIN SULFATE 40 MG/ML IJ SOLN
80.0000 mg | Freq: Once | INTRAVENOUS | Status: DC
  Filled 2015-05-19: qty 2

## 2015-05-19 MED ORDER — IOTHALAMATE MEGLUMINE 43 % IV SOLN
INTRAVENOUS | Status: DC | PRN
  Administered 2015-05-19: 15 mL

## 2015-05-19 MED ORDER — FENTANYL CITRATE (PF) 100 MCG/2ML IJ SOLN
25.0000 ug | INTRAMUSCULAR | Status: DC | PRN
  Administered 2015-05-19 (×3): 25 ug via INTRAVENOUS

## 2015-05-19 MED ORDER — PHENYLEPHRINE HCL 10 MG/ML IJ SOLN
INTRAMUSCULAR | Status: DC | PRN
  Administered 2015-05-19 (×5): 100 ug via INTRAVENOUS

## 2015-05-19 MED ORDER — METOPROLOL SUCCINATE ER 25 MG PO TB24
25.0000 mg | ORAL_TABLET | Freq: Every day | ORAL | Status: DC
  Administered 2015-05-19: 25 mg via ORAL
  Filled 2015-05-19 (×2): qty 1

## 2015-05-19 MED ORDER — HYDROCODONE-ACETAMINOPHEN 5-325 MG PO TABS: 1.0000 | ORAL_TABLET | Freq: Four times a day (QID) | ORAL | Status: DC | PRN

## 2015-05-19 MED ORDER — FENTANYL CITRATE (PF) 100 MCG/2ML IJ SOLN
INTRAMUSCULAR | Status: AC
  Administered 2015-05-19: 25 ug via INTRAVENOUS
  Filled 2015-05-19: qty 2

## 2015-05-19 MED ORDER — FAMOTIDINE 20 MG PO TABS
ORAL_TABLET | ORAL | Status: AC
  Administered 2015-05-19: 20 mg
  Filled 2015-05-19: qty 1

## 2015-05-19 MED ORDER — LACTATED RINGERS IV SOLN
INTRAVENOUS | Status: DC
  Administered 2015-05-19 (×3): via INTRAVENOUS

## 2015-05-19 MED ORDER — PROPOFOL 10 MG/ML IV BOLUS
INTRAVENOUS | Status: DC | PRN
  Administered 2015-05-19: 40 mg via INTRAVENOUS
  Administered 2015-05-19: 160 mg via INTRAVENOUS

## 2015-05-19 MED ORDER — OXYBUTYNIN CHLORIDE 5 MG PO TABS: 5.0000 mg | ORAL_TABLET | Freq: Three times a day (TID) | ORAL | Status: DC | PRN

## 2015-05-19 MED ORDER — SODIUM CHLORIDE 0.9 % IV SOLN
1.0000 g | Freq: Once | INTRAVENOUS | Status: AC
  Administered 2015-05-19: 1 g via INTRAVENOUS
  Filled 2015-05-19: qty 1000

## 2015-05-19 MED ORDER — MIDAZOLAM HCL 2 MG/2ML IJ SOLN
INTRAMUSCULAR | Status: DC | PRN
  Administered 2015-05-19: 2 mg via INTRAVENOUS

## 2015-05-19 MED ORDER — NEOSTIGMINE METHYLSULFATE 10 MG/10ML IV SOLN
INTRAVENOUS | Status: DC | PRN
  Administered 2015-05-19: 4 mg via INTRAVENOUS

## 2015-05-19 MED ORDER — ONDANSETRON HCL 4 MG/2ML IJ SOLN
4.0000 mg | Freq: Once | INTRAMUSCULAR | Status: AC | PRN
  Administered 2015-05-19: 4 mg via INTRAVENOUS

## 2015-05-19 SURGICAL SUPPLY — 35 items
ADAPTER SCOPE UROLOK II (MISCELLANEOUS) IMPLANT
BAG DRAIN CYSTO-URO LG1000N (MISCELLANEOUS) ×3 IMPLANT
BASKET ZERO TIP 1.9FR (BASKET) ×3 IMPLANT
CATH FOL 2WAY LX 16X5 (CATHETERS) ×3 IMPLANT
CATH URETL 5X70 OPEN END (CATHETERS) ×3 IMPLANT
CNTNR SPEC 2.5X3XGRAD LEK (MISCELLANEOUS) ×2
CONRAY 43 FOR UROLOGY 50M (MISCELLANEOUS) ×3 IMPLANT
CONT SPEC 4OZ STER OR WHT (MISCELLANEOUS) ×1
CONTAINER SPEC 2.5X3XGRAD LEK (MISCELLANEOUS) ×2 IMPLANT
DRAPE UTILITY 15X26 TOWEL STRL (DRAPES) ×3 IMPLANT
FEE TECHNICIAN ONLY PER HOUR (MISCELLANEOUS) IMPLANT
GLOVE BIO SURGEON STRL SZ 6.5 (GLOVE) ×3 IMPLANT
GOWN STRL REUS W/ TWL LRG LVL3 (GOWN DISPOSABLE) ×4 IMPLANT
GOWN STRL REUS W/ TWL LRG LVL4 (GOWN DISPOSABLE) ×4 IMPLANT
GOWN STRL REUS W/TWL LRG LVL3 (GOWN DISPOSABLE) ×2
GOWN STRL REUS W/TWL LRG LVL4 (GOWN DISPOSABLE) ×2
GUIDEWIRE SUPER STIFF .035X180 (WIRE) ×3 IMPLANT
HOLDER FOLEY CATH W/STRAP (MISCELLANEOUS) ×3 IMPLANT
INTRODUCER DILATOR DOUBLE (INTRODUCER) ×3 IMPLANT
KIT RM TURNOVER CYSTO AR (KITS) ×3 IMPLANT
LASER FIBER 200M SMARTSCOPE (Laser) ×3 IMPLANT
PACK CYSTO AR (MISCELLANEOUS) ×3 IMPLANT
PREP PVP WINGED SPONGE (MISCELLANEOUS) ×3 IMPLANT
PUMP SINGLE ACTION SAP (PUMP) IMPLANT
SENSORWIRE 0.038 NOT ANGLED (WIRE) ×6
SET CYSTO W/LG BORE CLAMP LF (SET/KITS/TRAYS/PACK) ×3 IMPLANT
SHEATH URETERAL 12FRX35CM (MISCELLANEOUS) ×3 IMPLANT
SOL .9 NS 3000ML IRR  AL (IV SOLUTION) ×1
SOL .9 NS 3000ML IRR UROMATIC (IV SOLUTION) ×2 IMPLANT
STENT URET 6FRX24 CONTOUR (STENTS) ×3 IMPLANT
STENT URET 6FRX26 CONTOUR (STENTS) IMPLANT
SURGILUBE 2OZ TUBE FLIPTOP (MISCELLANEOUS) ×3 IMPLANT
SYRINGE IRR TOOMEY STRL 70CC (SYRINGE) ×3 IMPLANT
WATER STERILE IRR 1000ML POUR (IV SOLUTION) ×3 IMPLANT
WIRE SENSOR 0.038 NOT ANGLED (WIRE) ×4 IMPLANT

## 2015-05-19 NOTE — Telephone Encounter (Signed)
Patient underwent right ureteroscopy today. There was some lower pole residual stone burden, proximally 10-20% of the overall.   Plan for staged procedure to complete the right side and will go ahead and treat the left side at that time as well for bilateral procedure.  Hollice Espy, MD

## 2015-05-19 NOTE — Op Note (Signed)
Date of procedure: 05/19/2015  Preoperative diagnosis:  1.  bilateral nephrolithasis   Postoperative diagnosis:  1.   Same as above   Procedure: 1. Cystoscopy 2. Bilateral retrograde pyelogram 3. Right ureteroscopy 4. Right laser lithotripsy 5. Right ureteral stent placement  Surgeon: Hollice Espy, MD  Anesthesia: General  Complications: None  Intraoperative findings:   EBL: minimal     Specimens: stone fragment  Drains: 6 x 24 Fr JJ ureteral stent on the right  Indication: Felicia Manning is a 60 y.o. patient with bilateral nephrolithiasis, right greater than left and recent onset of left flank pain which resolved over the past few days.KUB and urine culture were negative preoperatively.  After reviewing the management options for treatment, she elected to proceed with the above surgical procedure(s). We have discussed the potential benefits and risks of the procedure, side effects of the proposed treatment, the likelihood of the patient achieving the goals of the procedure, and any potential problems that might occur during the procedure or recuperation. Informed consent has been obtained.  Description of procedure:  The patient was taken to the operating room and general anesthesia was induced.  The patient was placed in the dorsal lithotomy position, prepped and draped in the usual sterile fashion, and preoperative antibiotics were administered . A preoperative time-out was performed.   At this point time, a 29 [place] scope was advanced per urethra into the bladder.  Attention was then turned to the left ureteral orifice which was cannulated using a 5 [place] open-ended ureteral catheter.  A gentle retrograde pyelogram was performed which revealed a delicate appearing decompressed ureter and decompressed collecting system. There is no evidence obstructing ureteral calculus.  Attention was then turned to the right ureteral orifice and a retrograde pyelogram was performed on  this side using a similar technique. This again revealed a delicate appearing decompressed ureter.  Were numerous calcifications over the mid and especially lower pole aspect of the kidney. A sensor wire was then placed up to level of the kidney under fluoroscopic guidance without difficulty. A dual lumen access sheath was then used just within the distal ureter to introduce the Super Stiff wire up to level of the kidney as well. The sensor wire was snapped in place as a safety wire and the superstiff wire was  A working wire. At this point in time. A Cook ureteral 12/14 access sheath was advanced up to level of the proximal ureter and the inner cannula was removed. The access sheath advanced easily without meeting any resistance. A dual channel flexible ureteroscope was then advanced into the kidney was identified. 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  There are several stones identified 1 in the upper pole, one in the midpole, and numerous stones in the extreme lower pole. A 200  laser fiber was then brought in and using the settings of 0.2 Hz and 30-50 J, the stones were dusted over a period of 2 hours.   A 1.9 French nitinol basket was used to extract numerous larger fragments which were passed off the field as stone specimen. It was able to clear proximal plane 90% of the overall stone burden leaving a stone in the extreme lower pole which I was having difficulty accessing.  At this point in time, I elected to abort the procedure and return at a later time to complete the right side and to the left side simultaneously.    The ureteroscope and access sheath were backed down the  length of the ureter and a few small stone fragments were extracted along the way. There is  Ureteral injuries noted. A 6 x 24 French double-J ureteral stent was then advanced over the wire up to level of the kidney under fluoroscopic guidance. The wire was partially drawn until full coil was noted within the  renal pelvis. The wire was then fully withdrawn and a full coil was noted within the bladder. The string was taken off of the stent. The bladder was then drained using the sheath of the scope. The patient was then cleaned and dried, repositioned the supine position, and taken to the PACU in stable condition.  Plan: We will plan to bring the patient back for a second look right-sided ureteroscopy to clear her residual stone burden on the right as well as treat her left-sided stone burden at that time.  Hollice Espy, M.D.

## 2015-05-19 NOTE — Transfer of Care (Signed)
Immediate Anesthesia Transfer of Care Note  Patient: [name]  Procedure(s) Performed: Procedure(s): URETEROSCOPY WITH HOLMIUM LASER LITHOTRIPSY (Right) CYSTOSCOPY WITH STENT PLACEMENT (Right) CYSTOSCOPY WITH RETROGRADE PYELOGRAM (Bilateral)  Patient Location: PACU  Anesthesia Type:General  Level of Consciousness: sedated and responds to stimulation  Airway & Oxygen Therapy: Patient Spontanous Breathing and Patient connected to face mask oxygen  Post-op Assessment: Report given to RN and Post -op Vital signs reviewed and stable  Post vital signs: Reviewed and stable  Last Vitals:  Filed Vitals:   05/19/15 1052 05/19/15 1054  BP: 88/51 87/43  Pulse: 56 59  Temp: 36.6 C   Resp: 12 12    Complications: No apparent anesthesia complications

## 2015-05-19 NOTE — Anesthesia Postprocedure Evaluation (Signed)
Anesthesia Post Note  Patient: [name]  Procedure(s) Performed: Procedure(s) (LRB): URETEROSCOPY WITH HOLMIUM LASER LITHOTRIPSY (Right) CYSTOSCOPY WITH STENT PLACEMENT (Right) CYSTOSCOPY WITH RETROGRADE PYELOGRAM (Bilateral)  Patient location during evaluation: PACU Anesthesia Type: General Level of consciousness: awake and alert Pain management: pain level controlled Vital Signs Assessment: post-procedure vital signs reviewed and stable Respiratory status: spontaneous breathing and respiratory function stable Cardiovascular status: stable Anesthetic complications: no    Last Vitals:  Filed Vitals:   05/19/15 1137 05/19/15 1152  BP: 136/72   Pulse: 62 60  Temp: 36.2 C 35.9 C  Resp: 14 16    Last Pain:  Filed Vitals:   05/19/15 1156  PainSc: 2                  KEPHART,WILLIAM K

## 2015-05-19 NOTE — Anesthesia Preprocedure Evaluation (Signed)
Anesthesia Evaluation  Patient identified by MRN, date of birth, ID band Patient awake    Reviewed: Allergy & Precautions, NPO status , Patient's Chart, lab work & pertinent test results  History of Anesthesia Complications Negative for: history of anesthetic complications  Airway Mallampati: III       Dental  (+) Edentulous Lower, Upper Dentures   Pulmonary neg pulmonary ROS, Current Smoker,           Cardiovascular hypertension, Pt. on medications and Pt. on home beta blockers + dysrhythmias ("flutters")      Neuro/Psych negative neurological ROS     GI/Hepatic GERD  Medicated and Controlled,(+) Hepatitis -, C  Endo/Other  Hypothyroidism   Renal/GU Renal disease (stones)     Musculoskeletal   Abdominal   Peds  Hematology   Anesthesia Other Findings   Reproductive/Obstetrics                             Anesthesia Physical Anesthesia Plan  ASA: III  Anesthesia Plan: General   Post-op Pain Management:    Induction: Intravenous  Airway Management Planned: Oral ETT  Additional Equipment:   Intra-op Plan:   Post-operative Plan:   Informed Consent: I have reviewed the patients History and Physical, chart, labs and discussed the procedure including the risks, benefits and alternatives for the proposed anesthesia with the patient or authorized representative who has indicated his/her understanding and acceptance.     Plan Discussed with:   Anesthesia Plan Comments:         Anesthesia Quick Evaluation

## 2015-05-19 NOTE — Discharge Instructions (Signed)
You have a ureteral stent in place.  This is a tube that extends from your kidney to your bladder.  This may cause urinary bleeding, burning with urination, and urinary frequency.  Please call our office or present to the ED if you develop fevers >101 or pain which is not able to be controlled with oral pain medications.  You may be given either Flomax and/ or ditropan to help with bladder spasms and stent pain in addition to pain medications.   ° °[facility] °[address], [address] °[place], [place] [address] °[phone] ° ° ° °AMBULATORY SURGERY  °DISCHARGE INSTRUCTIONS ° ° °1) The drugs that you were given will stay in your system until tomorrow so for the next 24 hours you should not: ° °A) Drive an automobile °B) Make any legal decisions °C) Drink any alcoholic beverage ° ° °2) You may resume regular meals tomorrow.  Today it is better to start with liquids and gradually work up to solid foods. ° °You may eat anything you prefer, but it is better to start with liquids, then soup and crackers, and gradually work up to solid foods. ° ° °3) Please notify your doctor immediately if you have any unusual bleeding, trouble breathing, redness and pain at the surgery site, drainage, fever, or pain not relieved by medication. ° ° ° °4) Additional Instructions: ° ° ° ° ° ° ° °Please contact your physician with any problems or Same Day Surgery at [phone], Monday through Friday 6 am to 4 pm, or [place] [facility] Main number at [phone]. °

## 2015-05-19 NOTE — Anesthesia Procedure Notes (Signed)
Procedure Name: Intubation Date/Time: 05/19/2015 8:55 AM Performed by: Justus Memory Pre-anesthesia Checklist: Patient identified, Emergency Drugs available, Suction available and Patient being monitored Patient Re-evaluated:Patient Re-evaluated prior to inductionOxygen Delivery Method: Circle system utilized Preoxygenation: Pre-oxygenation with 100% oxygen Intubation Type: IV induction Ventilation: Mask ventilation without difficulty Grade View: Grade I Tube type: Oral Tube size: 7.0 mm Number of attempts: 1 Airway Equipment and Method: Stylet Placement Confirmation: ETT inserted through vocal cords under direct vision and positive ETCO2 Secured at: 21 cm Tube secured with: Tape Dental Injury: Teeth and Oropharynx as per pre-operative assessment

## 2015-05-19 NOTE — Interval H&P Note (Signed)
History and Physical Interval Note:  05/19/2015 8:31 AM  Felicia Manning  has presented today for surgery, with the diagnosis of right nephrolithiasis  The various methods of treatment have been discussed with the patient and family. After consideration of risks, benefits and other options for treatment, the patient has consented to  Procedure(s): URETEROSCOPY WITH HOLMIUM LASER LITHOTRIPSY (Right) CYSTOSCOPY WITH STENT PLACEMENT (Right) CYSTOSCOPY WITH RETROGRADE PYELOGRAM (Bilateral) as a surgical intervention .  The patient's history has been reviewed, patient examined, no change in status, stable for surgery.  I have reviewed the patient's chart and labs.  Questions were answered to the patient's satisfaction.    Left sided pain nearly resolved.  Plan to start with left RTG, if abnormal or concern of passing stone, treat left.    If normal, will plan for treated right sided stone burden.    Patient is agreable with this plan.    Hollice Espy

## 2015-05-19 NOTE — H&P (View-Only) (Signed)
05/02/2015 10:34 AM   Selinda Orion May 17, 1955 [id number]  Referring provider: Glendon Axe, MD [place] [facility] [address], [place] [address]  Chief Complaint  Patient presents with  . Nephrolithiasis    New Patient    HPI:  60 year old female who presented to the emergency room on 04/08/2015 with 3 days of fever, flank pain, and a positive UA. She had multiple nonobstructing kidney stones, right greater than left. No obstructing stones. She was admitted and treated for pyelonephritis.  Urine culture ultimately grew pansensitive Escherichia coli.  She does have a personal history of stones which have required surgical mention on multiple occasions including ureteroscopy and ESWL. Her last intervention prior to stent placement was 3-4 years ago.  Over the past year, she's had intermittent right flank pain and pressure.  She returns today to discuss definitive management of her non-obstructing stones.  PMH: Past Medical History  Diagnosis Date  . Thyroid cancer ([place]) 2012  . Hypertension   . Hepatitis C   . Hypothyroidism   . Thyroid cancer ([place])   . GERD (gastroesophageal reflux disease)   . Barrett's esophagus   . Nephrolithiasis     Surgical History: Past Surgical History  Procedure Laterality Date  . Colonoscopy with propofol N/A 04/25/2015    Procedure: COLONOSCOPY WITH PROPOFOL;  Surgeon: Josefine Class, MD;  Location: [facility] ENDOSCOPY;  Service: Endoscopy;  Laterality: N/A;  . Esophagogastroduodenoscopy (egd) with propofol N/A 04/25/2015    Procedure: ESOPHAGOGASTRODUODENOSCOPY (EGD) WITH PROPOFOL;  Surgeon: Josefine Class, MD;  Location: [facility] ENDOSCOPY;  Service: Endoscopy;  Laterality: N/A;  . Thyroidectomy  2010  . Abdominal hysterectomy  2006  . Lithotripsy  2009    Home Medications:    Medication List       This list is accurate as of: 05/02/15 10:34 AM.  Always use your most recent med list.               levothyroxine 150 MCG tablet  Commonly known as:  SYNTHROID, LEVOTHROID  Take 150 mcg by mouth daily. Take with a full glass of water at least 30 minutes before breakfast     losartan 50 MG tablet  Commonly known as:  COZAAR  TAKE 1 TABLET (50 MG TOTAL) BY MOUTH ONCE DAILY.     metoprolol succinate 25 MG 24 hr tablet  Commonly known as:  TOPROL-XL  Take 25 mg by mouth daily.     omeprazole 40 MG capsule  Commonly known as:  PRILOSEC  Take 40 mg by mouth daily.        Allergies: No Known Allergies  Family History: Family History  Problem Relation Age of Onset  . CAD Mother   . Bladder Cancer Neg Hx   . Prostate cancer Neg Hx   . Kidney cancer Neg Hx     Social History:  reports that she has been smoking Cigarettes.  She has been smoking about 1.00 pack per day. She has never used smokeless tobacco. She reports that she drinks alcohol. She reports that she does not use illicit drugs.  ROS: UROLOGY Frequent Urination?: Yes Hard to postpone urination?: Yes Burning/pain with urination?: No Get up at night to urinate?: Yes Leakage of urine?: Yes Urine stream starts and stops?: No Trouble starting stream?: No Do you have to strain to urinate?: No Blood in urine?: No Urinary tract infection?: Yes Sexually transmitted disease?: No Injury to kidneys or bladder?: No Painful intercourse?: No Weak stream?: No Currently  pregnant?: No Vaginal bleeding?: No Last menstrual period?: hysterectomy  Gastrointestinal Nausea?: No Vomiting?: No Indigestion/heartburn?: No Diarrhea?: No Constipation?: No  Constitutional Fever: No Night sweats?: No Weight loss?: No Fatigue?: No  Skin Skin rash/lesions?: No Itching?: No  Eyes Blurred vision?: No Double vision?: No  Ears/Nose/Throat Sore throat?: No Sinus problems?: No  Hematologic/Lymphatic Swollen glands?: No Easy bruising?: No  Cardiovascular Leg swelling?: No Chest pain?: No  Respiratory Cough?:  Yes Shortness of breath?: No  Endocrine Excessive thirst?: No  Musculoskeletal Back pain?: Yes Joint pain?: No  Neurological Headaches?: Yes Dizziness?: No  Psychologic Depression?: No Anxiety?: No  Physical Exam: BP 142/89 mmHg  Pulse 75  Ht '5\' 7"'$  (1.702 m)  Wt 190 lb (86.183 kg)  BMI 29.75 kg/m2  Constitutional:  Alert and oriented, No acute distress. HEENT: [place] AT, moist mucus membranes.  Trachea midline, no masses. Cardiovascular: No clubbing, cyanosis, or edema. RRR. Respiratory: Normal respiratory effort, no increased work of breathing. CTAB. GI: Abdomen is soft, nontender, nondistended, no abdominal masses GU: No CVA tenderness.  Skin: No rashes, bruises or suspicious lesions. Lymph: No cervical or inguinal adenopathy. Neurologic: Grossly intact, no focal deficits, moving all 4 extremities. Psychiatric: Normal mood and affect.  Laboratory Data: Lab Results  Component Value Date   WBC 9.7 04/09/2015   HGB 12.1 04/09/2015   HCT 36.6 04/09/2015   MCV 87.9 04/09/2015   PLT 169 04/09/2015    Lab Results  Component Value Date   CREATININE 0.72 04/09/2015   Urinalysis Results for orders placed or performed in visit on 05/02/15  Microscopic Examination  Result Value Ref Range   WBC, UA 11-30 (A) 0 -  5 /hpf   RBC, UA 3-10 (A) 0 -  2 /hpf   Epithelial Cells (non renal) 0-10 0 - 10 /hpf   Bacteria, UA None seen None seen/Few  Urinalysis, Complete  Result Value Ref Range   Specific Gravity, UA 1.015 1.005 - 1.030   pH, UA 5.5 5.0 - 7.5   Color, UA Yellow Yellow   Appearance Ur Hazy (A) Clear   Leukocytes, UA 1+ (A) Negative   Protein, UA Negative Negative/Trace   Glucose, UA Negative Negative   Ketones, UA Negative Negative   RBC, UA Trace (A) Negative   Bilirubin, UA Negative Negative   Urobilinogen, Ur 0.2 0.2 - 1.0 mg/dL   Nitrite, UA Negative Negative   Microscopic Examination See below:     Pertinent Imaging: Study Result     CLINICAL  DATA: Right side flank pain and back pain since Saturday.  EXAM: CT ABDOMEN AND PELVIS WITHOUT CONTRAST  TECHNIQUE: Multidetector CT imaging of the abdomen and pelvis was performed following the standard protocol without IV contrast.  COMPARISON: None.  FINDINGS: Dependent atelectasis or scarring in the lung bases. Heart is normal size. No effusions.  Small low-density lesions centrally within the liver, likely small cyst. Gallbladder, spleen, pancreas, adrenals are unremarkable.  Bilateral nephrolithiasis. No ureteral stones or hydronephrosis bilaterally. Multiple bilateral renal stones, the largest in the left lower pole measuring up to 12 mm.  Multiple right renal cysts. Descending colonic and sigmoid diverticulosis. No active diverticulitis. Stomach and small bowel decompressed, unremarkable. Appendix is normal. No free fluid, free air or adenopathy. Aorta and iliac vessels are calcified, non aneurysmal. No acute bony abnormality or focal bone lesion.  IMPRESSION: Bilateral nephrolithiasis. If no hydronephrosis or ureteral stones.  Left colonic diverticulosis.   Electronically Signed  By: Rolm Baptise M.D.  On: 04/08/2015  09:49    SSD 8.5 cm HFu 820  CT scan personally reviewed today.  Assessment & Plan:    1. Kidney stone Nonobstructing right greater than left stone burden. She has at least 10 stones diffusely throughout her right kidney measuring up to 12 mm in the right lower pole. I recommend starting on the right side to clear her stone burden.  We discussed alternative treatment options including right staged ureteroscopy versus right PCNL. Risks and benefits of each were discussed. I do not feel she is a good candidate for ESWL given her numerous stones.  She is most adjusted in proceeding with stage right ureteroscopy. Obviously that she will need at least 2 procedures on her right side possibly more. We discussed risk of the procedure  including risk of bleeding, infection, damage to surrounding strictures, ureteral injury, stent discomfort, and need for multiple procedures. - Urinalysis, Complete - CULTURE, URINE COMPREHENSIVE  2. History of pyelonephritis Repeat urine culture today for preop.  Reviewed the risk of postoperative fevers/sepsis given history of possibly infected stones - CULTURE, URINE COMPREHENSIVE   Schedule staged R URS, LL, stent   Hollice Espy, MD  [facility] [address], [place], [place] [address] [phone]  I spent 25 min with this patient of which greater than 50% was spent in counseling and coordination of care with the patient.

## 2015-05-21 NOTE — Telephone Encounter (Signed)
Notified pt of surgery scheduled 05/26/15 & to call Friday prior to surgery for arrival time to SDS. Advised pt to follow instructions given to her at the pre-admit testing appt prior to previous surgery & be npo after mn day of surgery. Pt voices understanding.

## 2015-05-26 ENCOUNTER — Ambulatory Visit: Payer: Managed Care, Other (non HMO) | Admitting: Anesthesiology

## 2015-05-26 ENCOUNTER — Encounter
Admission: RE | Disposition: A | Discharge status: Home / Self Care | Payer: Self-pay | Source: Ambulatory Visit | Attending: Urology

## 2015-05-26 ENCOUNTER — Ambulatory Visit
Admission: RE | Admit: 2015-05-26 | Discharge: 2015-05-26 | Disposition: A | Discharge status: Home / Self Care | Payer: Managed Care, Other (non HMO) | Source: Ambulatory Visit | Attending: Urology | Admitting: Urology

## 2015-05-26 ENCOUNTER — Encounter: Payer: Self-pay | Admitting: *Deleted

## 2015-05-26 DIAGNOSIS — N200 Calculus of kidney: Secondary | ICD-10-CM | POA: Insufficient documentation

## 2015-05-26 DIAGNOSIS — K219 Gastro-esophageal reflux disease without esophagitis: Secondary | ICD-10-CM | POA: Diagnosis not present

## 2015-05-26 DIAGNOSIS — I10 Essential (primary) hypertension: Secondary | ICD-10-CM | POA: Diagnosis not present

## 2015-05-26 DIAGNOSIS — F17210 Nicotine dependence, cigarettes, uncomplicated: Secondary | ICD-10-CM | POA: Insufficient documentation

## 2015-05-26 DIAGNOSIS — Z85850 Personal history of malignant neoplasm of thyroid: Secondary | ICD-10-CM | POA: Diagnosis not present

## 2015-05-26 DIAGNOSIS — Z90710 Acquired absence of both cervix and uterus: Secondary | ICD-10-CM | POA: Diagnosis not present

## 2015-05-26 DIAGNOSIS — E890 Postprocedural hypothyroidism: Secondary | ICD-10-CM | POA: Insufficient documentation

## 2015-05-26 DIAGNOSIS — Z79899 Other long term (current) drug therapy: Secondary | ICD-10-CM | POA: Diagnosis not present

## 2015-05-26 DIAGNOSIS — Z8249 Family history of ischemic heart disease and other diseases of the circulatory system: Secondary | ICD-10-CM | POA: Insufficient documentation

## 2015-05-26 DIAGNOSIS — K2270 Barrett's esophagus without dysplasia: Secondary | ICD-10-CM | POA: Diagnosis not present

## 2015-05-26 DIAGNOSIS — B1920 Unspecified viral hepatitis C without hepatic coma: Secondary | ICD-10-CM | POA: Diagnosis not present

## 2015-05-26 DIAGNOSIS — Z87442 Personal history of urinary calculi: Secondary | ICD-10-CM | POA: Insufficient documentation

## 2015-05-26 HISTORY — PX: CYSTOSCOPY WITH STENT PLACEMENT: SHX5790

## 2015-05-26 HISTORY — PX: URETEROSCOPY WITH HOLMIUM LASER LITHOTRIPSY: SHX6645

## 2015-05-26 HISTORY — PX: CYSTOSCOPY W/ URETERAL STENT PLACEMENT: SHX1429

## 2015-05-26 LAB — STONE ANALYSIS
Ca Oxalate,Dihydrate: 3 %
Ca Oxalate,Monohydr.: 65 %
Ca phos cry stone ql IR: 32 %
Stone Weight KSTONE: 47 mg

## 2015-05-26 SURGERY — URETEROSCOPY, WITH LITHOTRIPSY USING HOLMIUM LASER
Anesthesia: General | Site: Ureter | Laterality: Right | Wound class: Clean Contaminated

## 2015-05-26 MED ORDER — FENTANYL CITRATE (PF) 100 MCG/2ML IJ SOLN: 25.0000 ug | INTRAMUSCULAR | Status: DC | PRN

## 2015-05-26 MED ORDER — HYDROMORPHONE HCL 1 MG/ML IJ SOLN
INTRAMUSCULAR | Status: DC | PRN
  Administered 2015-05-26: 1 mg via INTRAVENOUS

## 2015-05-26 MED ORDER — LACTATED RINGERS IV SOLN
INTRAVENOUS | Status: DC
  Administered 2015-05-26 (×2): via INTRAVENOUS

## 2015-05-26 MED ORDER — GENTAMICIN IN SALINE 1.6-0.9 MG/ML-% IV SOLN
80.0000 mg | Freq: Once | INTRAVENOUS | Status: AC
  Administered 2015-05-26: 80 mg via INTRAVENOUS
  Filled 2015-05-26: qty 50

## 2015-05-26 MED ORDER — GENTAMICIN SULFATE 40 MG/ML IJ SOLN: 80.0000 mg | Freq: Once | INTRAVENOUS | Status: DC

## 2015-05-26 MED ORDER — SODIUM CHLORIDE 0.9 % IV SOLN
1.0000 g | Freq: Once | INTRAVENOUS | Status: AC
  Administered 2015-05-26: 1 g via INTRAVENOUS

## 2015-05-26 MED ORDER — HYDROCODONE-ACETAMINOPHEN 5-325 MG PO TABS: 1.0000 | ORAL_TABLET | Freq: Four times a day (QID) | ORAL | Status: DC | PRN

## 2015-05-26 MED ORDER — ACETAMINOPHEN 10 MG/ML IV SOLN
INTRAVENOUS | Status: DC | PRN
  Administered 2015-05-26: 1000 mg via INTRAVENOUS

## 2015-05-26 MED ORDER — DEXTROSE 5 % IV SOLN
10.0000 mg | INTRAVENOUS | Status: DC | PRN
  Administered 2015-05-26: 25 ug/min via INTRAVENOUS

## 2015-05-26 MED ORDER — LIDOCAINE HCL (PF) 4 % IJ SOLN
INTRAMUSCULAR | Status: DC | PRN
  Administered 2015-05-26: 4 mL via RESPIRATORY_TRACT

## 2015-05-26 MED ORDER — PHENYLEPHRINE HCL 10 MG/ML IJ SOLN
INTRAMUSCULAR | Status: DC | PRN
  Administered 2015-05-26 (×2): 100 ug via INTRAVENOUS
  Administered 2015-05-26 (×2): 200 ug via INTRAVENOUS
  Administered 2015-05-26: 100 ug via INTRAVENOUS

## 2015-05-26 MED ORDER — SODIUM CHLORIDE 0.9 % IV SOLN
INTRAVENOUS | Status: AC
  Filled 2015-05-26: qty 1000

## 2015-05-26 MED ORDER — LIDOCAINE HCL (CARDIAC) 20 MG/ML IV SOLN
INTRAVENOUS | Status: DC | PRN
  Administered 2015-05-26: 20 mg via INTRAVENOUS

## 2015-05-26 MED ORDER — KETOROLAC TROMETHAMINE 30 MG/ML IJ SOLN: 30.0000 mg | Freq: Once | INTRAMUSCULAR | Status: DC | PRN

## 2015-05-26 MED ORDER — ACETAMINOPHEN 10 MG/ML IV SOLN
INTRAVENOUS | Status: AC
  Filled 2015-05-26: qty 100

## 2015-05-26 MED ORDER — ROCURONIUM BROMIDE 100 MG/10ML IV SOLN
INTRAVENOUS | Status: DC | PRN
  Administered 2015-05-26: 40 mg via INTRAVENOUS
  Administered 2015-05-26 (×4): 10 mg via INTRAVENOUS

## 2015-05-26 MED ORDER — PROPOFOL 10 MG/ML IV BOLUS
INTRAVENOUS | Status: DC | PRN
  Administered 2015-05-26: 130 mg via INTRAVENOUS

## 2015-05-26 MED ORDER — EPHEDRINE SULFATE 50 MG/ML IJ SOLN
INTRAMUSCULAR | Status: DC | PRN
  Administered 2015-05-26: 15 mg via INTRAVENOUS
  Administered 2015-05-26 (×2): 5 mg via INTRAVENOUS

## 2015-05-26 MED ORDER — MEPERIDINE HCL 25 MG/ML IJ SOLN: 6.2500 mg | INTRAMUSCULAR | Status: DC | PRN

## 2015-05-26 MED ORDER — FENTANYL CITRATE (PF) 100 MCG/2ML IJ SOLN
INTRAMUSCULAR | Status: DC | PRN
  Administered 2015-05-26: 50 ug via INTRAVENOUS
  Administered 2015-05-26: 200 ug via INTRAVENOUS

## 2015-05-26 MED ORDER — SUGAMMADEX SODIUM 200 MG/2ML IV SOLN
INTRAVENOUS | Status: DC | PRN
  Administered 2015-05-26: 300 mg via INTRAVENOUS

## 2015-05-26 SURGICAL SUPPLY — 34 items
ADAPTER SCOPE UROLOK II (MISCELLANEOUS) IMPLANT
ADHESIVE MASTISOL STRL (MISCELLANEOUS) ×2 IMPLANT
BAG DRAIN CYSTO-URO LG1000N (MISCELLANEOUS) ×2 IMPLANT
BASKET ZERO TIP 1.9FR (BASKET) ×4 IMPLANT
CATH FOL 2WAY LX 16X5 (CATHETERS) IMPLANT
CATH URETL 5X70 OPEN END (CATHETERS) ×2 IMPLANT
CNTNR SPEC 2.5X3XGRAD LEK (MISCELLANEOUS) ×1
CONRAY 43 FOR UROLOGY 50M (MISCELLANEOUS) IMPLANT
CONT SPEC 4OZ STER OR WHT (MISCELLANEOUS) ×1
CONTAINER SPEC 2.5X3XGRAD LEK (MISCELLANEOUS) ×1 IMPLANT
DRAPE UTILITY 15X26 TOWEL STRL (DRAPES) ×2 IMPLANT
DRSG TEGADERM 2-3/8X2-3/4 SM (GAUZE/BANDAGES/DRESSINGS) ×2 IMPLANT
GLOVE BIO SURGEON STRL SZ 6.5 (GLOVE) ×6 IMPLANT
GOWN STRL REUS W/ TWL LRG LVL4 (GOWN DISPOSABLE) ×2 IMPLANT
GOWN STRL REUS W/TWL LRG LVL4 (GOWN DISPOSABLE) ×2
HOLDER FOLEY CATH W/STRAP (MISCELLANEOUS) IMPLANT
INTRODUCER DILATOR DOUBLE (INTRODUCER) IMPLANT
KIT RM TURNOVER CYSTO AR (KITS) ×2 IMPLANT
LASER FIBER 200M SMARTSCOPE (Laser) ×2 IMPLANT
PACK CYSTO AR (MISCELLANEOUS) ×2 IMPLANT
PREP PVP WINGED SPONGE (MISCELLANEOUS) ×2 IMPLANT
PUMP SINGLE ACTION SAP (PUMP) IMPLANT
SENSORWIRE 0.038 NOT ANGLED (WIRE) ×2
SET CYSTO W/LG BORE CLAMP LF (SET/KITS/TRAYS/PACK) ×2 IMPLANT
SHEATH URETERAL 12FRX35CM (MISCELLANEOUS) ×2 IMPLANT
SOL .9 NS 3000ML IRR  AL (IV SOLUTION) ×1
SOL .9 NS 3000ML IRR UROMATIC (IV SOLUTION) ×1 IMPLANT
STENT URET 6FRX24 CONTOUR (STENTS) ×2 IMPLANT
STENT URET 6FRX26 CONTOUR (STENTS) IMPLANT
STENT URETL SOFT 6X24 (Stent) ×2 IMPLANT
SURGILUBE 2OZ TUBE FLIPTOP (MISCELLANEOUS) ×2 IMPLANT
SYRINGE IRR TOOMEY STRL 70CC (SYRINGE) ×2 IMPLANT
WATER STERILE IRR 1000ML POUR (IV SOLUTION) ×2 IMPLANT
WIRE SENSOR 0.038 NOT ANGLED (WIRE) ×1 IMPLANT

## 2015-05-26 NOTE — Anesthesia Preprocedure Evaluation (Addendum)
Anesthesia Evaluation  Patient identified by MRN, date of birth, ID band Patient awake    Reviewed: Allergy & Precautions, NPO status , Patient's Chart, lab work & pertinent test results  History of Anesthesia Complications (+) PONV  Airway Mallampati: II  TM Distance: >3 FB Neck ROM: Full    Dental no notable dental hx. (+) Edentulous Lower, Upper Dentures   Pulmonary neg pulmonary ROS, asthma , neg recent URI, Current Smoker,    Pulmonary exam normal breath sounds clear to auscultation       Cardiovascular Exercise Tolerance: Good hypertension, negative cardio ROS Normal cardiovascular exam+ dysrhythmias  Rhythm:Regular Rate:Normal     Neuro/Psych negative neurological ROS  negative psych ROS   GI/Hepatic negative GI ROS, Neg liver ROS, GERD  ,(+) Hepatitis -, C  Endo/Other  negative endocrine ROSHypothyroidism   Renal/GU Renal diseasenegative Renal ROS  negative genitourinary   Musculoskeletal negative musculoskeletal ROS (+)   Abdominal Normal abdominal exam  (+)   Peds negative pediatric ROS (+)  Hematology negative hematology ROS (+)   Anesthesia Other Findings Arrhythmias a few years ago, stress related per pt, on metoprolol now  Reproductive/Obstetrics negative OB ROS                            Anesthesia Physical Anesthesia Plan  ASA: III  Anesthesia Plan: General   Post-op Pain Management:    Induction:   Airway Management Planned: Oral ETT and LMA  Additional Equipment:   Intra-op Plan:   Post-operative Plan: Extubation in OR  Informed Consent:   Dental advisory given  Plan Discussed with:   Anesthesia Plan Comments:         Anesthesia Quick Evaluation

## 2015-05-26 NOTE — Op Note (Signed)
Date of procedure: 05/26/2015  Preoperative diagnosis:  1. Bilateral nonobstructing nephrolithiasis, right greater than left   Postoperative diagnosis:  1. Same as above   Procedure: 1. Cystoscopy 2. Right ureteroscopy 3. Laser lithotripsy 4. Basket extraction of stone fragment 5. Right retrograde pyelogram 6. Right ureteral stent exchange  Surgeon: Hollice Espy, MD  Anesthesia: General  Complications: None  Intraoperative findings: Significant right mid and lower pole stone burden treated. Right upper pole calyx with infundibular stenosis noted, stone within obstructed calyx treated. Ureteral fragments removed.  EBL: minimal   Specimens: none  Drains: 6 x 24 Fr JJ ureteral stent on left (string left in placE)  Indication: Felicia Manning is a 60 y.o. patient with significant bilateral nephrolithiasis who previously underwent right ureteroscopy, laser lithotripsy for treatment of her large burden of nonobstructing right-sided stones. She returns today to the operating room to complete the staged procedure. Also discussed the possibility of pursuing left ureteroscopy pending her progress on the right side..  After reviewing the management options for treatment, she elected to proceed with the above surgical procedure(s). We have discussed the potential benefits and risks of the procedure, side effects of the proposed treatment, the likelihood of the patient achieving the goals of the procedure, and any potential problems that might occur during the procedure or recuperation. Informed consent has been obtained.  Description of procedure:  The patient was taken to the operating room and general anesthesia was induced.  The patient was placed in the dorsal lithotomy position, prepped and draped in the usual sterile fashion, and preoperative antibiotics were administered. A preoperative time-out was performed.   A 21 French rigid cystoscope was advanced per urethra into the bladder.  Attention was turned to the right ureteral orifice from which a ureteral stent was seen emanating. The distal coil of the stent was grasped and brought out to the level of the urethral meatus. The tail of then cannulated using a sensor wire up to level of the kidney. The wire was left in place and the stent was removed. A dual lumen access sheath was used just within the distal ureter under fluoroscopic guidance to introduce a second Super Stiff wire up to level of the kidney. Of note, fluoroscopy within the kidney, there were two somewhat large stones noted residually within the extreme lower pole as well as in mid pole complex.  There also a calcification noted within an upper pole calyx as well.  The sensor wire was snapped in place as a safety wire and a Cook 01/1434 cm ureteral access sheath was advanced over the Super Stiff wire up to level of the proximal ureter without difficulty. The inner cannula was removed along with the wire. A flexible dual channel ureteroscope was then advanced through the sheath up to level of the kidney. A number of small stone fragments were identified within the renal pelvis presumably from the previous ureteroscopy procedure. A 1.9 French nitinol tip less basket was then used to extract each of these stone fragments. Attention was then turned to treatment of the lower pole stones. The 200 micron laser fiber was brought in and using the settings initially of 0.2 Hz and 40 J and then later 1 J and 15 Hz, the stones were fragmented. Accommodation of fragmentation and basket extraction were used to clear this extreme lower pole stone ordered. I then moved on to treating the residual midpole stone burden.  Once these were adequately cleared, a calcification could be seen still within the upper  pole although there was no obvious stone at this location. A small opening of a stenotic infundibulum was identified and a wire was passed through this small opening. I then railroaded the scope  across this opening into a cavity containing a fairly sizable stone. The laser was used to fragment the stone in the pieces were extracted using the basket. I did incise the opening somewhat using the tip of the laser fiber to help facilitate the extraction of the stone fragments.  At this point in time, the right kidney was adequately cleared of stone.  The scope was backed to level of the proximal ureter and a retrograde pyelogram was performed to ensure that each never calyx had been identified. Once this was confirmed, the scope was backed out of the ureter inspecting it along the way while removing the access sheath simultaneously. There were several fairly sizable ureteral fragments identified which were extracted via basket. The scope was reintroduced a few times to ensure that the entire length of the ureter was cleared. There was no ureteral trauma noted.  A 6 x 24 French double-J ureteral stent was advanced over the safety wire up to level of the renal pelvis. The wire was partially drawn until full coil was noted fluoroscopically within the renal pelvis. The wire was then fully withdrawn and a full coil was noted within the bladder. The bladder was drained using the sheath of the cystoscope. The stent string was left in place and affixed to the patient's left inner thigh using Mastisol and Tegaderm.  Due to the length of the case, overall significant residual stone burden on the right, and laser time, I elected not to proceed with bilateral ureteroscopy in this setting.  Plan: Patient will remove her own stent in a few days.  She will return to our office in 4 weeks with a renal stent prior. That point, we will discuss returning to the operating room to treat her left-sided nonobstructing stones.  Hollice Espy, M.D.

## 2015-05-26 NOTE — Anesthesia Procedure Notes (Signed)
Procedure Name: Intubation Date/Time: 05/26/2015 11:58 AM Performed by: Rosaria Ferries, Jw Covin Pre-anesthesia Checklist: Patient identified, Emergency Drugs available, Suction available and Patient being monitored Patient Re-evaluated:Patient Re-evaluated prior to inductionOxygen Delivery Method: Circle system utilized Preoxygenation: Pre-oxygenation with 100% oxygen Intubation Type: IV induction Laryngoscope Size: Mac and 3 Grade View: Grade I Number of attempts: 1 Placement Confirmation: ETT inserted through vocal cords under direct vision,  positive ETCO2 and breath sounds checked- equal and bilateral Secured at: 21 cm Tube secured with: Tape Dental Injury: Teeth and Oropharynx as per pre-operative assessment

## 2015-05-26 NOTE — Discharge Instructions (Signed)
You have a ureteral stent in place.  This is a tube that extends from your kidney to your bladder.  This may cause urinary bleeding, burning with urination, and urinary frequency.  Please call our office or present to the ED if you develop fevers >101 or pain which is not able to be controlled with oral pain medications.  You may be given either Flomax and/ or ditropan to help with bladder spasms and stent pain in addition to pain medications.    Your stent is attached to a string taped to your left inner thigh.  You may remove this on Friday AM.  Untape and gently pull to remove the stent in entirety.    [facility] [address] [place], [place] [address] [phone] AMBULATORY SURGERY  DISCHARGE INSTRUCTIONS   1) The drugs that you were given will stay in your system until tomorrow so for the next 24 hours you should not:  A) Drive an automobile B) Make any legal decisions C) Drink any alcoholic beverage   2) You may resume regular meals tomorrow.  Today it is better to start with liquids and gradually work up to solid foods.  You may eat anything you prefer, but it is better to start with liquids, then soup and crackers, and gradually work up to solid foods.   3) Please notify your doctor immediately if you have any unusual bleeding, trouble breathing, redness and pain at the surgery site, drainage, fever, or pain not relieved by medication.    4) Additional Instructions:        Please contact your physician with any problems or Same Day Surgery at [phone], Monday through Friday 6 am to 4 pm, or [place] at [facility] number at [phone].

## 2015-05-26 NOTE — Interval H&P Note (Signed)
History and Physical Interval Note:  05/26/2015 11:20 AM  Felicia Manning  has presented today for surgery, with the diagnosis of BILATERAL RENAL STONES  The various methods of treatment have been discussed with the patient and family. After consideration of risks, benefits and other options for treatment, the patient has consented to  Procedure(s): URETEROSCOPY WITH HOLMIUM LASER LITHOTRIPSY (Right) CYSTOSCOPY WITH STENT REPLACEMENT (Right) CYSTOSCOPY WITH STENT PLACEMENT (Left) as a surgical intervention .  The patient's history has been reviewed, patient examined, no change in status, stable for surgery.  I have reviewed the patient's chart and labs.  Questions were answered to the patient's satisfaction.    Continue to clear right sided stones (part of staged procedure), then possibly move to the left.  RRR CTAB  Hollice Espy

## 2015-05-26 NOTE — Telephone Encounter (Signed)
-----   Message from Hollice Espy, MD sent at 05/26/2015  2:35 PM EDT ----- Regarding: RUS in 4 weeks Order placed from PACU  F/u in 4 weeks with RUS prior  Hollice Espy, MD

## 2015-05-26 NOTE — H&P (View-Only) (Signed)
05/02/2015 10:34 AM   Selinda Orion 14-Jun-1955 [id number]  Referring provider: Glendon Axe, MD [facility] [place], [place] [address]  Chief Complaint  Patient presents with  . Nephrolithiasis    New Patient    HPI:  60 year old female who presented to the emergency room on 04/08/2015 with 3 days of fever, flank pain, and a positive UA. She had multiple nonobstructing kidney stones, right greater than left. No obstructing stones. She was admitted and treated for pyelonephritis.  Urine culture ultimately grew pansensitive Escherichia coli.  She does have a personal history of stones which have required surgical mention on multiple occasions including ureteroscopy and ESWL. Her last intervention prior to stent placement was 3-4 years ago.  Over the past year, she's had intermittent right flank pain and pressure.  She returns today to discuss definitive management of her non-obstructing stones.  PMH: Past Medical History  Diagnosis Date  . Thyroid cancer ([place]) 2012  . Hypertension   . Hepatitis C   . Hypothyroidism   . Thyroid cancer ([place])   . GERD (gastroesophageal reflux disease)   . Barrett's esophagus   . Nephrolithiasis     Surgical History: Past Surgical History  Procedure Laterality Date  . Colonoscopy with propofol N/A 04/25/2015    Procedure: COLONOSCOPY WITH PROPOFOL;  Surgeon: Josefine Class, MD;  Location: [facility] ENDOSCOPY;  Service: Endoscopy;  Laterality: N/A;  . Esophagogastroduodenoscopy (egd) with propofol N/A 04/25/2015    Procedure: ESOPHAGOGASTRODUODENOSCOPY (EGD) WITH PROPOFOL;  Surgeon: Josefine Class, MD;  Location: [facility] ENDOSCOPY;  Service: Endoscopy;  Laterality: N/A;  . Thyroidectomy  2010  . Abdominal hysterectomy  2006  . Lithotripsy  2009    Home Medications:    Medication List       This list is accurate as of: 05/02/15 10:34 AM.  Always use your most recent med list.               levothyroxine 150 MCG tablet  Commonly known as:  SYNTHROID, LEVOTHROID  Take 150 mcg by mouth daily. Take with a full glass of water at least 30 minutes before breakfast     losartan 50 MG tablet  Commonly known as:  COZAAR  TAKE 1 TABLET (50 MG TOTAL) BY MOUTH ONCE DAILY.     metoprolol succinate 25 MG 24 hr tablet  Commonly known as:  TOPROL-XL  Take 25 mg by mouth daily.     omeprazole 40 MG capsule  Commonly known as:  PRILOSEC  Take 40 mg by mouth daily.        Allergies: No Known Allergies  Family History: Family History  Problem Relation Age of Onset  . CAD Mother   . Bladder Cancer Neg Hx   . Prostate cancer Neg Hx   . Kidney cancer Neg Hx     Social History:  reports that she has been smoking Cigarettes.  She has been smoking about 1.00 pack per day. She has never used smokeless tobacco. She reports that she drinks alcohol. She reports that she does not use illicit drugs.  ROS: UROLOGY Frequent Urination?: Yes Hard to postpone urination?: Yes Burning/pain with urination?: No Get up at night to urinate?: Yes Leakage of urine?: Yes Urine stream starts and stops?: No Trouble starting stream?: No Do you have to strain to urinate?: No Blood in urine?: No Urinary tract infection?: Yes Sexually transmitted disease?: No Injury to kidneys or bladder?: No Painful intercourse?: No Weak stream?: No Currently  pregnant?: No Vaginal bleeding?: No Last menstrual period?: hysterectomy  Gastrointestinal Nausea?: No Vomiting?: No Indigestion/heartburn?: No Diarrhea?: No Constipation?: No  Constitutional Fever: No Night sweats?: No Weight loss?: No Fatigue?: No  Skin Skin rash/lesions?: No Itching?: No  Eyes Blurred vision?: No Double vision?: No  Ears/Nose/Throat Sore throat?: No Sinus problems?: No  Hematologic/Lymphatic Swollen glands?: No Easy bruising?: No  Cardiovascular Leg swelling?: No Chest pain?: No  Respiratory Cough?:  Yes Shortness of breath?: No  Endocrine Excessive thirst?: No  Musculoskeletal Back pain?: Yes Joint pain?: No  Neurological Headaches?: Yes Dizziness?: No  Psychologic Depression?: No Anxiety?: No  Physical Exam: BP 142/89 mmHg  Pulse 75  Ht '5\' 7"'$  (1.702 m)  Wt 190 lb (86.183 kg)  BMI 29.75 kg/m2  Constitutional:  Alert and oriented, No acute distress. HEENT: [place] AT, moist mucus membranes.  Trachea midline, no masses. Cardiovascular: No clubbing, cyanosis, or edema. RRR. Respiratory: Normal respiratory effort, no increased work of breathing. CTAB. GI: Abdomen is soft, nontender, nondistended, no abdominal masses GU: No CVA tenderness.  Skin: No rashes, bruises or suspicious lesions. Lymph: No cervical or inguinal adenopathy. Neurologic: Grossly intact, no focal deficits, moving all 4 extremities. Psychiatric: Normal mood and affect.  Laboratory Data: Lab Results  Component Value Date   WBC 9.7 04/09/2015   HGB 12.1 04/09/2015   HCT 36.6 04/09/2015   MCV 87.9 04/09/2015   PLT 169 04/09/2015    Lab Results  Component Value Date   CREATININE 0.72 04/09/2015   Urinalysis Results for orders placed or performed in visit on 05/02/15  Microscopic Examination  Result Value Ref Range   WBC, UA 11-30 (A) 0 -  5 /hpf   RBC, UA 3-10 (A) 0 -  2 /hpf   Epithelial Cells (non renal) 0-10 0 - 10 /hpf   Bacteria, UA None seen None seen/Few  Urinalysis, Complete  Result Value Ref Range   Specific Gravity, UA 1.015 1.005 - 1.030   pH, UA 5.5 5.0 - 7.5   Color, UA Yellow Yellow   Appearance Ur Hazy (A) Clear   Leukocytes, UA 1+ (A) Negative   Protein, UA Negative Negative/Trace   Glucose, UA Negative Negative   Ketones, UA Negative Negative   RBC, UA Trace (A) Negative   Bilirubin, UA Negative Negative   Urobilinogen, Ur 0.2 0.2 - 1.0 mg/dL   Nitrite, UA Negative Negative   Microscopic Examination See below:     Pertinent Imaging: Study Result     CLINICAL  DATA: Right side flank pain and back pain since Saturday.  EXAM: CT ABDOMEN AND PELVIS WITHOUT CONTRAST  TECHNIQUE: Multidetector CT imaging of the abdomen and pelvis was performed following the standard protocol without IV contrast.  COMPARISON: None.  FINDINGS: Dependent atelectasis or scarring in the lung bases. Heart is normal size. No effusions.  Small low-density lesions centrally within the liver, likely small cyst. Gallbladder, spleen, pancreas, adrenals are unremarkable.  Bilateral nephrolithiasis. No ureteral stones or hydronephrosis bilaterally. Multiple bilateral renal stones, the largest in the left lower pole measuring up to 12 mm.  Multiple right renal cysts. Descending colonic and sigmoid diverticulosis. No active diverticulitis. Stomach and small bowel decompressed, unremarkable. Appendix is normal. No free fluid, free air or adenopathy. Aorta and iliac vessels are calcified, non aneurysmal. No acute bony abnormality or focal bone lesion.  IMPRESSION: Bilateral nephrolithiasis. If no hydronephrosis or ureteral stones.  Left colonic diverticulosis.   Electronically Signed  By: Rolm Baptise M.D.  On: 04/08/2015  09:49    SSD 8.5 cm HFu 820  CT scan personally reviewed today.  Assessment & Plan:    1. Kidney stone Nonobstructing right greater than left stone burden. She has at least 10 stones diffusely throughout her right kidney measuring up to 12 mm in the right lower pole. I recommend starting on the right side to clear her stone burden.  We discussed alternative treatment options including right staged ureteroscopy versus right PCNL. Risks and benefits of each were discussed. I do not feel she is a good candidate for ESWL given her numerous stones.  She is most adjusted in proceeding with stage right ureteroscopy. Obviously that she will need at least 2 procedures on her right side possibly more. We discussed risk of the procedure  including risk of bleeding, infection, damage to surrounding strictures, ureteral injury, stent discomfort, and need for multiple procedures. - Urinalysis, Complete - CULTURE, URINE COMPREHENSIVE  2. History of pyelonephritis Repeat urine culture today for preop.  Reviewed the risk of postoperative fevers/sepsis given history of possibly infected stones - CULTURE, URINE COMPREHENSIVE   Schedule staged R URS, LL, stent   Hollice Espy, MD  [facility] [address], [place], [place] [address] [phone]  I spent 25 min with this patient of which greater than 50% was spent in counseling and coordination of care with the patient.

## 2015-05-26 NOTE — Transfer of Care (Signed)
Immediate Anesthesia Transfer of Care Note  Patient: [name]  Procedure(s) Performed: Procedure(s): URETEROSCOPY WITH HOLMIUM LASER LITHOTRIPSY (Right) CYSTOSCOPY WITH STENT REPLACEMENT (Right) CYSTOSCOPY WITH STENT PLACEMENT (Right)  Patient Location: PACU  Anesthesia Type:General  Level of Consciousness: awake, alert , oriented and patient cooperative  Airway & Oxygen Therapy: Patient Spontanous Breathing and Patient connected to nasal cannula oxygen  Post-op Assessment: Report given to RN and Post -op Vital signs reviewed and stable  Post vital signs: Reviewed and stable  Last Vitals:  Filed Vitals:   05/26/15 1031 05/26/15 1429  BP: 133/80 125/67  Pulse: 85 78  Temp: 36.4 C 36.9 C  Resp: 16 19    Complications: No apparent anesthesia complications

## 2015-05-26 NOTE — Anesthesia Postprocedure Evaluation (Signed)
Anesthesia Post Note  Patient: [name]  Procedure(s) Performed: Procedure(s) (LRB): URETEROSCOPY WITH HOLMIUM LASER LITHOTRIPSY (Right) CYSTOSCOPY WITH STENT REPLACEMENT (Right) CYSTOSCOPY WITH STENT PLACEMENT (Right)  Patient location during evaluation: PACU Anesthesia Type: General Level of consciousness: awake and alert Pain management: pain level controlled Vital Signs Assessment: post-procedure vital signs reviewed and stable Respiratory status: spontaneous breathing, nonlabored ventilation, respiratory function stable and patient connected to nasal cannula oxygen Cardiovascular status: blood pressure returned to baseline and stable Postop Assessment: no signs of nausea or vomiting Anesthetic complications: no    Last Vitals:  Filed Vitals:   05/26/15 1429 05/26/15 1443  BP: 125/67 123/70  Pulse: 78 79  Temp: 36.9 C   Resp: 19 15    Last Pain:  Filed Vitals:   05/26/15 1448  PainSc: [place]

## 2015-05-27 ENCOUNTER — Encounter: Payer: Self-pay | Admitting: Urology

## 2015-05-27 NOTE — Telephone Encounter (Signed)
Done

## 2015-06-11 DIAGNOSIS — B351 Tinea unguium: Secondary | ICD-10-CM | POA: Insufficient documentation

## 2015-06-11 DIAGNOSIS — M810 Age-related osteoporosis without current pathological fracture: Secondary | ICD-10-CM | POA: Insufficient documentation

## 2015-06-16 ENCOUNTER — Ambulatory Visit
Admission: RE | Admit: 2015-06-16 | Discharge: 2015-06-16 | Disposition: A | Discharge status: Home / Self Care | Payer: Managed Care, Other (non HMO) | Source: Ambulatory Visit | Attending: Urology | Admitting: Urology

## 2015-06-16 DIAGNOSIS — N281 Cyst of kidney, acquired: Secondary | ICD-10-CM | POA: Insufficient documentation

## 2015-06-16 DIAGNOSIS — N200 Calculus of kidney: Secondary | ICD-10-CM | POA: Insufficient documentation

## 2015-06-18 DIAGNOSIS — N12 Tubulo-interstitial nephritis, not specified as acute or chronic: Secondary | ICD-10-CM | POA: Insufficient documentation

## 2015-06-24 ENCOUNTER — Encounter: Payer: Self-pay | Admitting: Urology

## 2015-06-24 ENCOUNTER — Ambulatory Visit (INDEPENDENT_AMBULATORY_CARE_PROVIDER_SITE_OTHER): Payer: Managed Care, Other (non HMO) | Admitting: Urology

## 2015-06-24 VITALS — BP 171/85 | HR 74 | Ht 66.0 in | Wt 185.0 lb

## 2015-06-24 DIAGNOSIS — Q6100 Congenital renal cyst, unspecified: Secondary | ICD-10-CM | POA: Diagnosis not present

## 2015-06-24 DIAGNOSIS — Z87448 Personal history of other diseases of urinary system: Secondary | ICD-10-CM

## 2015-06-24 DIAGNOSIS — N281 Cyst of kidney, acquired: Secondary | ICD-10-CM

## 2015-06-24 DIAGNOSIS — Z87440 Personal history of urinary (tract) infections: Secondary | ICD-10-CM

## 2015-06-24 DIAGNOSIS — N200 Calculus of kidney: Secondary | ICD-10-CM

## 2015-06-24 LAB — MICROSCOPIC EXAMINATION: RBC, UA: NONE SEEN /hpf (ref 0–?)

## 2015-06-24 LAB — URINALYSIS, COMPLETE
Bilirubin, UA: NEGATIVE
Glucose, UA: NEGATIVE
Ketones, UA: NEGATIVE
Nitrite, UA: NEGATIVE
Protein, UA: NEGATIVE
Specific Gravity, UA: <1.005 — ABNORMAL LOW (ref 1.005–1.030)
Urobilinogen, Ur: 0.2 mg/dL (ref 0.2–1.0)
pH, UA: 6.5 (ref 5.0–7.5)

## 2015-06-24 NOTE — Progress Notes (Signed)
5:18 PM  06/24/2015   Felicia Manning March 25, 1955 [id number]  Referring provider: Glendon Axe, MD [facility] [place], [place] [address]  Chief Complaint  Patient presents with  . Results    4wk with u/s results    HPI:  60 year old female who presented to the emergency room on 04/08/2015 with 3 days of fever, flank pain, and a positive UA. She had multiple nonobstructing kidney stones, right greater than left. No obstructing stones. She was admitted and treated for pyelonephritis.  Urine culture ultimately grew pansensitive Escherichia coli she was appropriately treated. Follow-up urine cultures were negative.  She underwent right stage ureteroscopy on 05/19/2015 followed by 05/26/2015. This was uncomplicated with considerable stone burden. and she removed her stent on a string.  She was noted to have an area of infundibular stenosis with a nonobstructing stone within this narrowed infundibulum.  Follow-up renal ultrasound does show a 9 mm right-sided residual stone within a midpole calyx, likely residual stone within the calyx with the stenotic infundibulum.  No postoperative issues. Her flank pain has since completely resolved.  Stone analysis consistent with 65% calcium oxalate monohydrate, 3% calcium oxalate dihydrate, and 32% calcium phosphate.  She does have a personal history of stones which have required surgical mention on multiple occasions including ureteroscopy and ESWL. Her last intervention prior to stent placement was 3-4 years ago prior to this.  She returns today to discuss definitive management of her left sided stones.  PMH: Past Medical History  Diagnosis Date  . Hypertension   . Hypothyroidism   . GERD (gastroesophageal reflux disease)   . Barrett's esophagus   . Nephrolithiasis   . Thyroid cancer ([place]) 2012  . Thyroid cancer ([place])   . Hepatitis C 2008  . Dysrhythmia     stress related at work.    Surgical  History: Past Surgical History  Procedure Laterality Date  . Colonoscopy with propofol N/A 04/25/2015    Procedure: COLONOSCOPY WITH PROPOFOL;  Surgeon: Josefine Class, MD;  Location: [facility] ENDOSCOPY;  Service: Endoscopy;  Laterality: N/A;  . Esophagogastroduodenoscopy (egd) with propofol N/A 04/25/2015    Procedure: ESOPHAGOGASTRODUODENOSCOPY (EGD) WITH PROPOFOL;  Surgeon: Josefine Class, MD;  Location: [facility] ENDOSCOPY;  Service: Endoscopy;  Laterality: N/A;  . Thyroidectomy  2010  . Abdominal hysterectomy  2006  . Lithotripsy  2009  . Eye surgery Bilateral 1964    lazy eye repair  . Eye surgery Bilateral 2001    lazy eye repair  . Ureteroscopy with holmium laser lithotripsy Right 05/19/2015    Procedure: URETEROSCOPY WITH HOLMIUM LASER LITHOTRIPSY;  Surgeon: Hollice Espy, MD;  Location: [facility] ORS;  Service: Urology;  Laterality: Right;  . Cystoscopy with stent placement Right 05/19/2015    Procedure: CYSTOSCOPY WITH STENT PLACEMENT;  Surgeon: Hollice Espy, MD;  Location: [facility] ORS;  Service: Urology;  Laterality: Right;  . Cystoscopy w/ retrogrades Bilateral 05/19/2015    Procedure: CYSTOSCOPY WITH RETROGRADE PYELOGRAM;  Surgeon: Hollice Espy, MD;  Location: [facility] ORS;  Service: Urology;  Laterality: Bilateral;  . Ureteroscopy with holmium laser lithotripsy Right 05/26/2015    Procedure: URETEROSCOPY WITH HOLMIUM LASER LITHOTRIPSY;  Surgeon: Hollice Espy, MD;  Location: [facility] ORS;  Service: Urology;  Laterality: Right;  . Cystoscopy w/ ureteral stent placement Right 05/26/2015    Procedure: CYSTOSCOPY WITH STENT REPLACEMENT;  Surgeon: Hollice Espy, MD;  Location: [facility] ORS;  Service: Urology;  Laterality: Right;  . Cystoscopy with stent placement Right 05/26/2015  Procedure: CYSTOSCOPY WITH STENT PLACEMENT;  Surgeon: Hollice Espy, MD;  Location: [facility] ORS;  Service: Urology;  Laterality: Right;    Home Medications:    Medication List       This list is accurate as of: 06/24/15   5:18 PM.  Always use your most recent med list.               HARVONI 90-400 MG Tabs  Generic drug:  Ledipasvir-Sofosbuvir     levothyroxine 150 MCG tablet  Commonly known as:  SYNTHROID, LEVOTHROID  Take 150 mcg by mouth at bedtime. Take with a full glass of water at least 30 minutes before breakfast     losartan 50 MG tablet  Commonly known as:  COZAAR  TAKE 1 TABLET (50 MG TOTAL) BY MOUTH ONCE DAILY in am.     metoprolol succinate 25 MG 24 hr tablet  Commonly known as:  TOPROL-XL  Take 25 mg by mouth every morning.     omeprazole 20 MG capsule  Commonly known as:  PRILOSEC  TAKE 1 CAPSULE (20 MG TOTAL) BY MOUTH ONCE DAILY.        Allergies: No Known Allergies  Family History: Family History  Problem Relation Age of Onset  . CAD Mother   . Bladder Cancer Neg Hx   . Prostate cancer Neg Hx   . Kidney cancer Neg Hx     Social History:  reports that she has been smoking Cigarettes.  She has been smoking about 1.00 pack per day. She has never used smokeless tobacco. She reports that she does not drink alcohol or use illicit drugs.  ROS: UROLOGY Frequent Urination?: Yes Hard to postpone urination?: Yes Burning/pain with urination?: No Get up at night to urinate?: No Leakage of urine?: Yes Urine stream starts and stops?: No Trouble starting stream?: No Do you have to strain to urinate?: No Blood in urine?: No Urinary tract infection?: No Sexually transmitted disease?: No Injury to kidneys or bladder?: No Painful intercourse?: No Weak stream?: No Currently pregnant?: No Vaginal bleeding?: No Last menstrual period?: n  Gastrointestinal Nausea?: No Vomiting?: No Indigestion/heartburn?: No Diarrhea?: Yes Constipation?: No  Constitutional Fever: No Night sweats?: No Weight loss?: No Fatigue?: Yes  Skin Skin rash/lesions?: No Itching?: No  Eyes Blurred vision?: No Double vision?: No  Ears/Nose/Throat Sore throat?: Yes Sinus problems?:  No  Hematologic/Lymphatic Swollen glands?: No Easy bruising?: No  Cardiovascular Leg swelling?: No Chest pain?: No  Respiratory Cough?: Yes Shortness of breath?: No  Endocrine Excessive thirst?: No  Musculoskeletal Back pain?: No Joint pain?: No  Neurological Headaches?: No Dizziness?: No  Psychologic Depression?: No Anxiety?: No  Physical Exam: BP 171/85 mmHg  Pulse 74  Ht '5\' 6"'$  (1.676 m)  Wt 185 lb (83.915 kg)  BMI 29.87 kg/m2  Constitutional:  Alert and oriented, No acute distress. HEENT: [place] AT, moist mucus membranes.  Trachea midline, no masses. Cardiovascular: No clubbing, cyanosis, or edema. RRR. Respiratory: Normal respiratory effort, no increased work of breathing. CTAB. GI: Abdomen is soft, nontender, nondistended, no abdominal masses GU: No CVA tenderness.  Skin: No rashes, bruises or suspicious lesions. Lymph: No cervical or inguinal adenopathy. Neurologic: Grossly intact, no focal deficits, moving all 4 extremities. Psychiatric: Normal mood and affect.  Laboratory Data: Lab Results  Component Value Date   WBC 9.7 04/09/2015   HGB 12.1 04/09/2015   HCT 36.6 04/09/2015   MCV 87.9 04/09/2015   PLT 169 04/09/2015    Lab Results  Component  Value Date   CREATININE 0.72 04/09/2015   Urinalysis Results for orders placed or performed in visit on 06/24/15  Microscopic Examination  Result Value Ref Range   WBC, UA 0-5 0 -  5 /hpf   RBC, UA None seen 0 -  2 /hpf   Epithelial Cells (non renal) 0-10 0 - 10 /hpf   Bacteria, UA Few (A) None seen/Few  Urinalysis, Complete  Result Value Ref Range   Specific Gravity, UA <1.005 (L) 1.005 - 1.030   pH, UA 6.5 5.0 - 7.5   Color, UA Yellow Yellow   Appearance Ur Clear Clear   Leukocytes, UA 1+ (A) Negative   Protein, UA Negative Negative/Trace   Glucose, UA Negative Negative   Ketones, UA Negative Negative   RBC, UA Trace (A) Negative   Bilirubin, UA Negative Negative   Urobilinogen, Ur 0.2 0.2 -  1.0 mg/dL   Nitrite, UA Negative Negative   Microscopic Examination See below:     Pertinent Imaging:      Study Result     CLINICAL DATA: Kidney stones. Status post lithotripsy.  EXAM: RENAL / URINARY TRACT ULTRASOUND COMPLETE  COMPARISON: 04/22/15  FINDINGS: Right Kidney:  Length: 12.1 cm. There is a cystic lesion containing internal septation arising from the lateral aspect of the mid left kidney measuring 5.8 by 4.4 x 5.6 cm. Simple appearing cyst arises from the medial aspect of the right kidney measuring 4.8 x 3.0 x 4.3 cm. 9 mm calculus is identified within the midpole of the right kidney.  Left Kidney:  Length: 11.7 cm. Stone within the inferior pole measures 9.7 mm. There is a simple cyst arising from the inferior pole measuring 1.7 x 1.3 x 1.2 cm. Echogenicity within normal limits. No mass or hydronephrosis visualized.  Bladder:  Appears normal for degree of bladder distention.  IMPRESSION: 1. Bilateral nonobstructing renal calculi. 2. Complicated cyst containing internal septation arising from the midpole of right kidney. Recommend further investigation with dedicated renal protocol MRI.   Electronically Signed  By: Kerby Moors M.D.  On: 06/16/2015 14:36     SSD 8.5 cm HFu 820   Assessment & Plan:    1. Nephrolithiasis Right greater than left nonobstructing nephrolithiasis status post stage right ureteroscopy. Follow-up renal ultrasound shows 1 residual 9 mm stone, presumably within the calyx with stenotic infundibulum, not accessible ureteroscopically--> will continue to follow  Plan for left-sided multiple nonobstructing stones discussed. She would like to proceed with left to staged ureteroscopy as this is well tolerated on the right side. We reviewed the risks benefits and she is familiar with the procedure. She is willing to proceed as planned. - Urinalysis, Complete  2. History of pyelonephritis No evidence of  infection today, preoperative urine culture repeated  3. 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 We'll reevaluate this with follow-up renal ultrasound following left ureteroscopy.    May consider follow-up MRI in the future.   Schedule staged L URS, LL, stent   Hollice Espy, MD  [facility] [address] [place], [place] [address] [phone]  I spent 25 min with this patient of which greater than 50% was spent in counseling and coordination of care with the patient.

## 2015-06-25 ENCOUNTER — Telehealth: Payer: Self-pay | Admitting: Radiology

## 2015-06-25 NOTE — Telephone Encounter (Signed)
Notified pt of staged surgery planned for 07/23/15 & 07/29/15, pre-admit testing phone interview on 07/11/15 between 9am-1pm and to call day prior to surgery for arrival time to SDS. Pt voices understanding.

## 2015-06-26 LAB — CULTURE, URINE COMPREHENSIVE

## 2015-06-27 ENCOUNTER — Telehealth: Payer: Self-pay

## 2015-06-27 DIAGNOSIS — N390 Urinary tract infection, site not specified: Secondary | ICD-10-CM

## 2015-06-27 MED ORDER — SULFAMETHOXAZOLE-TRIMETHOPRIM 800-160 MG PO TABS: 1.0000 | ORAL_TABLET | Freq: Two times a day (BID) | ORAL | Status: AC

## 2015-06-27 NOTE — Telephone Encounter (Signed)
Spoke with pt in reference to +ucx. Made pt aware abx has been sent to pharmacy. Pt voiced understanding. Lab appt prior to surgery was made and orders placed.

## 2015-06-27 NOTE — Telephone Encounter (Signed)
-----   Message from Hollice Espy, MD sent at 06/26/2015  5:37 PM EDT ----- Preop urine culture + E. Coli.  Please treat with bactrim DS bid x 3 days.  i would like a repeat UCx prior to surgery.  Hollice Espy, MD

## 2015-07-11 ENCOUNTER — Inpatient Hospital Stay: Admission: RE | Admit: 2015-07-11 | Payer: Managed Care, Other (non HMO) | Source: Ambulatory Visit

## 2015-07-11 NOTE — Patient Instructions (Signed)
  Your procedure is scheduled on: 07-23-15 Report to [facility] To find out your arrival time please call [phone] between 1PM - 3PM on 07-22-15  Remember: Instructions that are not followed completely may result in serious medical risk, up to and including death, or upon the discretion of your surgeon and anesthesiologist your surgery may need to be rescheduled.    __X__ 1. Do not eat food or drink liquids after midnight. No gum chewing or hard candies.     __X__ 2. No Alcohol for 24 hours before or after surgery.   ____ 3. Bring all medications with you on the day of surgery if instructed.    ____ 4. Notify your doctor if there is any change in your medical condition     (cold, fever, infections).     Do not wear jewelry, make-up, hairpins, clips or nail polish.  Do not wear lotions, powders, or perfumes. You may wear deodorant.  Do not shave 48 hours prior to surgery. Men may shave face and neck.  Do not bring valuables to the hospital.    [facility] is not responsible for any belongings or valuables.               Contacts, dentures or bridgework may not be worn into surgery.  Leave your suitcase in the car. After surgery it may be brought to your room.  For patients admitted to the hospital, discharge time is determined by your treatment team.   Patients discharged the day of surgery will not be allowed to drive home.   Please read over the following fact sheets that you were given:      _X___ Take these medicines the morning of surgery with A SIP OF WATER:    1. HARVONI  2.  METOPROLOL  3. LOSARTAN  4.OMEPRAZOLE  5.TAKE AN EXTRA OMEPRAZOLE ON Wednesday NIGHT BEFORE BED  6.  ____ Fleet Enema (as directed)   ____ Use CHG Soap as directed  ____ Use inhalers on the day of surgery  ____ Stop metformin 2 days prior to surgery    ____ Take 1/2 of usual insulin dose the night before surgery and none on the morning of surgery.   ____ Stop  Coumadin/Plavix/aspirin-NA/  __X__ Stop Anti-inflammatories-NO NSAIDS OR ASA PRODUCTS-TYLENOL OK TO TAKE   ____ Stop supplements until after surgery.    ____ Bring C-Pap to the hospital.

## 2015-07-15 ENCOUNTER — Other Ambulatory Visit: Payer: Managed Care, Other (non HMO)

## 2015-07-15 DIAGNOSIS — N390 Urinary tract infection, site not specified: Secondary | ICD-10-CM

## 2015-07-15 LAB — URINALYSIS, COMPLETE
Bilirubin, UA: NEGATIVE
Glucose, UA: NEGATIVE
Ketones, UA: NEGATIVE
Nitrite, UA: NEGATIVE
Protein, UA: NEGATIVE
Specific Gravity, UA: 1.02 (ref 1.005–1.030)
Urobilinogen, Ur: 0.2 mg/dL (ref 0.2–1.0)
pH, UA: 5.5 (ref 5.0–7.5)

## 2015-07-15 LAB — MICROSCOPIC EXAMINATION: Bacteria, UA: NONE SEEN

## 2015-07-17 LAB — CULTURE, URINE COMPREHENSIVE

## 2015-07-23 ENCOUNTER — Ambulatory Visit: Payer: Managed Care, Other (non HMO) | Admitting: Registered Nurse

## 2015-07-23 ENCOUNTER — Encounter: Payer: Self-pay | Admitting: *Deleted

## 2015-07-23 ENCOUNTER — Ambulatory Visit
Admission: RE | Admit: 2015-07-23 | Discharge: 2015-07-23 | Disposition: A | Discharge status: Home / Self Care | Payer: Managed Care, Other (non HMO) | Source: Ambulatory Visit | Attending: Urology | Admitting: Urology

## 2015-07-23 ENCOUNTER — Encounter
Admission: RE | Disposition: A | Discharge status: Home / Self Care | Payer: Self-pay | Source: Ambulatory Visit | Attending: Urology

## 2015-07-23 DIAGNOSIS — Z9889 Other specified postprocedural states: Secondary | ICD-10-CM | POA: Diagnosis not present

## 2015-07-23 DIAGNOSIS — Z8619 Personal history of other infectious and parasitic diseases: Secondary | ICD-10-CM | POA: Diagnosis not present

## 2015-07-23 DIAGNOSIS — N200 Calculus of kidney: Secondary | ICD-10-CM | POA: Diagnosis not present

## 2015-07-23 DIAGNOSIS — F17210 Nicotine dependence, cigarettes, uncomplicated: Secondary | ICD-10-CM | POA: Insufficient documentation

## 2015-07-23 DIAGNOSIS — Z87442 Personal history of urinary calculi: Secondary | ICD-10-CM | POA: Diagnosis not present

## 2015-07-23 DIAGNOSIS — Z90710 Acquired absence of both cervix and uterus: Secondary | ICD-10-CM | POA: Diagnosis not present

## 2015-07-23 DIAGNOSIS — B1920 Unspecified viral hepatitis C without hepatic coma: Secondary | ICD-10-CM | POA: Diagnosis not present

## 2015-07-23 DIAGNOSIS — K219 Gastro-esophageal reflux disease without esophagitis: Secondary | ICD-10-CM | POA: Diagnosis not present

## 2015-07-23 DIAGNOSIS — E039 Hypothyroidism, unspecified: Secondary | ICD-10-CM | POA: Diagnosis not present

## 2015-07-23 DIAGNOSIS — Z79899 Other long term (current) drug therapy: Secondary | ICD-10-CM | POA: Diagnosis not present

## 2015-07-23 DIAGNOSIS — Z8249 Family history of ischemic heart disease and other diseases of the circulatory system: Secondary | ICD-10-CM | POA: Insufficient documentation

## 2015-07-23 DIAGNOSIS — N281 Cyst of kidney, acquired: Secondary | ICD-10-CM | POA: Diagnosis not present

## 2015-07-23 DIAGNOSIS — Z85850 Personal history of malignant neoplasm of thyroid: Secondary | ICD-10-CM | POA: Diagnosis not present

## 2015-07-23 DIAGNOSIS — K2270 Barrett's esophagus without dysplasia: Secondary | ICD-10-CM | POA: Insufficient documentation

## 2015-07-23 DIAGNOSIS — I10 Essential (primary) hypertension: Secondary | ICD-10-CM | POA: Diagnosis not present

## 2015-07-23 HISTORY — PX: CYSTOSCOPY/URETEROSCOPY/HOLMIUM LASER/STENT PLACEMENT: SHX6546

## 2015-07-23 SURGERY — CYSTOSCOPY/URETEROSCOPY/HOLMIUM LASER/STENT PLACEMENT
Anesthesia: General | Laterality: Left | Wound class: Clean Contaminated

## 2015-07-23 MED ORDER — ROCURONIUM BROMIDE 100 MG/10ML IV SOLN
INTRAVENOUS | Status: DC | PRN
  Administered 2015-07-23: 50 mg via INTRAVENOUS
  Administered 2015-07-23: 20 mg via INTRAVENOUS

## 2015-07-23 MED ORDER — CEFAZOLIN SODIUM-DEXTROSE 2-4 GM/100ML-% IV SOLN: 2.0000 g | Freq: Once | INTRAVENOUS | Status: DC

## 2015-07-23 MED ORDER — FENTANYL CITRATE (PF) 100 MCG/2ML IJ SOLN
INTRAMUSCULAR | Status: DC | PRN
  Administered 2015-07-23: 100 ug via INTRAVENOUS

## 2015-07-23 MED ORDER — TAMSULOSIN HCL 0.4 MG PO CAPS: 0.4000 mg | ORAL_CAPSULE | Freq: Every day | ORAL | Status: DC

## 2015-07-23 MED ORDER — GLYCOPYRROLATE 0.2 MG/ML IJ SOLN
INTRAMUSCULAR | Status: DC | PRN
  Administered 2015-07-23: 0.2 mg via INTRAVENOUS

## 2015-07-23 MED ORDER — FENTANYL CITRATE (PF) 100 MCG/2ML IJ SOLN: 25.0000 ug | INTRAMUSCULAR | Status: DC | PRN

## 2015-07-23 MED ORDER — DEXAMETHASONE SODIUM PHOSPHATE 10 MG/ML IJ SOLN
INTRAMUSCULAR | Status: DC | PRN
  Administered 2015-07-23: 5 mg via INTRAVENOUS

## 2015-07-23 MED ORDER — CEFAZOLIN SODIUM-DEXTROSE 2-4 GM/100ML-% IV SOLN
INTRAVENOUS | Status: AC
  Administered 2015-07-23: 2 g via INTRAVENOUS
  Filled 2015-07-23: qty 100

## 2015-07-23 MED ORDER — DOCUSATE SODIUM 100 MG PO CAPS: 100.0000 mg | ORAL_CAPSULE | Freq: Two times a day (BID) | ORAL | Status: DC

## 2015-07-23 MED ORDER — ONDANSETRON HCL 4 MG/2ML IJ SOLN
4.0000 mg | Freq: Once | INTRAMUSCULAR | Status: AC | PRN
  Administered 2015-07-23: 4 mg via INTRAVENOUS

## 2015-07-23 MED ORDER — ACETAMINOPHEN 10 MG/ML IV SOLN
INTRAVENOUS | Status: DC | PRN
  Administered 2015-07-23: 1000 mg via INTRAVENOUS

## 2015-07-23 MED ORDER — PROPOFOL 10 MG/ML IV BOLUS
INTRAVENOUS | Status: DC | PRN
  Administered 2015-07-23: 170 mg via INTRAVENOUS

## 2015-07-23 MED ORDER — LACTATED RINGERS IV SOLN
INTRAVENOUS | Status: DC
  Administered 2015-07-23 (×2): via INTRAVENOUS

## 2015-07-23 MED ORDER — ONDANSETRON HCL 4 MG/2ML IJ SOLN
INTRAMUSCULAR | Status: DC | PRN
  Administered 2015-07-23: 4 mg via INTRAVENOUS

## 2015-07-23 MED ORDER — HYDROCODONE-ACETAMINOPHEN 5-325 MG PO TABS: 1.0000 | ORAL_TABLET | Freq: Four times a day (QID) | ORAL | Status: DC | PRN

## 2015-07-23 MED ORDER — ACETAMINOPHEN 10 MG/ML IV SOLN
INTRAVENOUS | Status: AC
  Filled 2015-07-23: qty 100

## 2015-07-23 MED ORDER — LIDOCAINE HCL (CARDIAC) 20 MG/ML IV SOLN
INTRAVENOUS | Status: DC | PRN
  Administered 2015-07-23: 100 mg via INTRAVENOUS

## 2015-07-23 MED ORDER — ONDANSETRON HCL 4 MG/2ML IJ SOLN
INTRAMUSCULAR | Status: AC
  Administered 2015-07-23: 4 mg via INTRAVENOUS
  Filled 2015-07-23: qty 2

## 2015-07-23 MED ORDER — IOTHALAMATE MEGLUMINE 43 % IV SOLN
INTRAVENOUS | Status: DC | PRN
  Administered 2015-07-23: 30 mL

## 2015-07-23 MED ORDER — LIDOCAINE HCL 2 % EX GEL
CUTANEOUS | Status: DC | PRN
  Administered 2015-07-23: 1 via TOPICAL

## 2015-07-23 MED ORDER — SUGAMMADEX SODIUM 500 MG/5ML IV SOLN
INTRAVENOUS | Status: DC | PRN
  Administered 2015-07-23: 350 mg via INTRAVENOUS

## 2015-07-23 MED ORDER — MIDAZOLAM HCL 2 MG/2ML IJ SOLN
INTRAMUSCULAR | Status: DC | PRN
  Administered 2015-07-23: 2 mg via INTRAVENOUS

## 2015-07-23 MED ORDER — OXYBUTYNIN CHLORIDE 5 MG PO TABS: 5.0000 mg | ORAL_TABLET | Freq: Three times a day (TID) | ORAL | Status: DC | PRN

## 2015-07-23 MED ORDER — EPHEDRINE SULFATE 50 MG/ML IJ SOLN
INTRAMUSCULAR | Status: DC | PRN
  Administered 2015-07-23: 15 mg via INTRAVENOUS

## 2015-07-23 SURGICAL SUPPLY — 32 items
ADAPTER SCOPE UROLOK II (MISCELLANEOUS) ×2 IMPLANT
ADHESIVE MASTISOL STRL (MISCELLANEOUS) ×2 IMPLANT
BAG DRAIN CYSTO-URO LG1000N (MISCELLANEOUS) ×2 IMPLANT
BASKET ZERO TIP 1.9FR (BASKET) IMPLANT
CATH URETL 5X70 OPEN END (CATHETERS) ×2 IMPLANT
CNTNR SPEC 2.5X3XGRAD LEK (MISCELLANEOUS) ×1
CONRAY 43 FOR UROLOGY 50M (MISCELLANEOUS) ×2 IMPLANT
CONT SPEC 4OZ STER OR WHT (MISCELLANEOUS) ×1
CONTAINER SPEC 2.5X3XGRAD LEK (MISCELLANEOUS) ×1 IMPLANT
DRAPE UTILITY 15X26 TOWEL STRL (DRAPES) ×2 IMPLANT
DRSG TEGADERM 2-3/8X2-3/4 SM (GAUZE/BANDAGES/DRESSINGS) ×2 IMPLANT
FEE TECHNICIAN ONLY PER HOUR (MISCELLANEOUS) IMPLANT
FIBER LASER LITHO 200 (Laser) ×2 IMPLANT
GLOVE BIO SURGEON STRL SZ 6.5 (GLOVE) ×2 IMPLANT
GOWN STRL REUS W/ TWL LRG LVL3 (GOWN DISPOSABLE) ×2 IMPLANT
GOWN STRL REUS W/TWL LRG LVL3 (GOWN DISPOSABLE) ×2
INTRODUCER DILATOR DOUBLE (INTRODUCER) ×2 IMPLANT
KIT RM TURNOVER CYSTO AR (KITS) ×2 IMPLANT
PACK CYSTO AR (MISCELLANEOUS) ×2 IMPLANT
PREP PVP WINGED SPONGE (MISCELLANEOUS) IMPLANT
PRO FLEX 200 LASER FIBER ×2 IMPLANT
PUMP SINGLE ACTION SAP (PUMP) IMPLANT
SENSORWIRE 0.038 NOT ANGLED (WIRE) ×4
SET CYSTO W/LG BORE CLAMP LF (SET/KITS/TRAYS/PACK) ×2 IMPLANT
SHEATH URETERAL 12FRX35CM (MISCELLANEOUS) ×2 IMPLANT
SOL .9 NS 3000ML IRR  AL (IV SOLUTION) ×1
SOL .9 NS 3000ML IRR UROMATIC (IV SOLUTION) ×1 IMPLANT
STENT URET 6FRX24 CONTOUR (STENTS) ×2 IMPLANT
STENT URET 6FRX26 CONTOUR (STENTS) IMPLANT
SURGILUBE 2OZ TUBE FLIPTOP (MISCELLANEOUS) ×2 IMPLANT
WATER STERILE IRR 1000ML POUR (IV SOLUTION) ×2 IMPLANT
WIRE SENSOR 0.038 NOT ANGLED (WIRE) ×2 IMPLANT

## 2015-07-23 NOTE — Transfer of Care (Signed)
Immediate Anesthesia Transfer of Care Note  Patient: Felicia Manning  Procedure(s) Performed: Procedure(s): CYSTOSCOPY/URETEROSCOPY/HOLMIUM LASER/STENT PLACEMENT/RETROGRADE PYELOGRAM (Left)  Patient Location: PACU  Anesthesia Type:General  Level of Consciousness: sedated  Airway & Oxygen Therapy: Patient Spontanous Breathing and Patient connected to face mask oxygen  Post-op Assessment: Report given to RN and Post -op Vital signs reviewed and stable  Post vital signs: Reviewed and stable  Last Vitals:  Filed Vitals:   07/23/15 0812 07/23/15 1050  BP: 126/80 133/56  Pulse: 73 82  Temp: 36.9 C 36.4 C  Resp: 16 25    Complications: No apparent anesthesia complications

## 2015-07-23 NOTE — Discharge Instructions (Signed)
You have a ureteral stent in place.  This is a tube that extends from your kidney to your bladder.  This may cause urinary bleeding, burning with urination, and urinary frequency.  Please call our office or present to the ED if you develop fevers >101 or pain which is not able to be controlled with oral pain medications.  You may be given either Flomax and/ or ditropan to help with bladder spasms and stent pain in addition to pain medications.    Your stent is on a string.  You may remove this stent in 5 days.  Untape from leg and pull gently until completely removed.    You will need follow up with renal ultrasound in 4 weeks to assess for residual swelling.    [facility] [address], [place], [place] [address] [phone]     AMBULATORY SURGERY  DISCHARGE INSTRUCTIONS   1) The drugs that you were given will stay in your system until tomorrow so for the next 24 hours you should not:  A) Drive an automobile B) Make any legal decisions C) Drink any alcoholic beverage   2) You may resume regular meals tomorrow.  Today it is better to start with liquids and gradually work up to solid foods.  You may eat anything you prefer, but it is better to start with liquids, then soup and crackers, and gradually work up to solid foods.   3) Please notify your doctor immediately if you have any unusual bleeding, trouble breathing, redness and pain at the surgery site, drainage, fever, or pain not relieved by medication.    4) Additional Instructions:        Please contact your physician with any problems or Same Day Surgery at [phone], Monday through Friday 6 am to 4 pm, or [place] at [facility] number at [phone].

## 2015-07-23 NOTE — Anesthesia Preprocedure Evaluation (Signed)
Anesthesia Evaluation  Patient identified by MRN, date of birth, ID band Patient awake    Airway Mallampati: II       Dental  (+) Upper Dentures   Pulmonary COPD, Current Smoker,     + decreased breath sounds      Cardiovascular Exercise Tolerance: Good hypertension, Pt. on home beta blockers + dysrhythmias  Rhythm:Regular Rate:Normal     Neuro/Psych    GI/Hepatic GERD  Medicated,(+) Hepatitis -, C  Endo/Other  Hypothyroidism   Renal/GU      Musculoskeletal   Abdominal Normal abdominal exam  (+)   Peds  Hematology   Anesthesia Other Findings   Reproductive/Obstetrics                             Anesthesia Physical Anesthesia Plan  ASA: III  Anesthesia Plan: General   Post-op Pain Management:    Induction: Intravenous  Airway Management Planned: LMA and Oral ETT  Additional Equipment:   Intra-op Plan:   Post-operative Plan: Extubation in OR  Informed Consent: I have reviewed the patients History and Physical, chart, labs and discussed the procedure including the risks, benefits and alternatives for the proposed anesthesia with the patient or authorized representative who has indicated his/her understanding and acceptance.     Plan Discussed with: CRNA  Anesthesia Plan Comments:         Anesthesia Quick Evaluation

## 2015-07-23 NOTE — Anesthesia Procedure Notes (Signed)
Procedure Name: Intubation Date/Time: 07/23/2015 9:36 AM Performed by: Doreen Salvage Pre-anesthesia Checklist: Patient identified, Patient being monitored, Timeout performed, Emergency Drugs available and Suction available Patient Re-evaluated:Patient Re-evaluated prior to inductionOxygen Delivery Method: Circle system utilized Preoxygenation: Pre-oxygenation with 100% oxygen Intubation Type: IV induction Ventilation: Mask ventilation without difficulty Laryngoscope Size: Miller and 2 Grade View: Grade I Tube type: Oral Tube size: 7.0 mm Number of attempts: 1 Airway Equipment and Method: Stylet Placement Confirmation: ETT inserted through vocal cords under direct vision,  positive ETCO2 and breath sounds checked- equal and bilateral Secured at: 21 cm Tube secured with: Tape Dental Injury: Teeth and Oropharynx as per pre-operative assessment

## 2015-07-23 NOTE — Op Note (Signed)
Date of procedure: 07/23/2015  Preoperative diagnosis:  1. Bilateral nephrolithiasis   Postoperative diagnosis:  1. Same as above   Procedure: 1. Left ureteroscopy 2. Laser lithotripsy 3. Left retrograde pyelogram 4. Basket extraction of Stone fragment 5. Left ureteral stent placement  Surgeon: Hollice Espy, MD  Anesthesia: General  Complications: None  Intraoperative findings: Multiple nonobstructing stones treated within the right kidney. Several stenotic infundibula noted. Several stones were submucosal.  EBL: Minimal  Specimens: Stones not sent for stone fragment as this was recently performed  Drains: 6 x 24 French double-J ureteral stent on left with string in place  Indication: Felicia Manning is a 60 y.o. patient with bilateral nephrolithiasis who presents today for management of her left-sided stone burden. Her right eye was previously treated..  After reviewing the management options for treatment, she elected to proceed with the above surgical procedure(s). We have discussed the potential benefits and risks of the procedure, side effects of the proposed treatment, the likelihood of the patient achieving the goals of the procedure, and any potential problems that might occur during the procedure or recuperation. Informed consent has been obtained.  Description of procedure:  The patient was taken to the operating room and general anesthesia was induced.  The patient was placed in the dorsal lithotomy position, prepped and draped in the usual sterile fashion, and preoperative antibiotics were administered. A preoperative time-out was performed.   A rigid 21 French cystoscope was advanced per urethra into the bladder. Attention was turned to the left ureteral orifice which was cannulated using a sensor wire up to level of the kidney. Prior to this, the UO was intubated with a 5 [place] open-ended ureteral catheter and gentle retropyelogram was performed to define the  left-sided anatomy. This revealed a normal caliber ureter without any hydroureteronephrosis. There were no filling defects noted. The scope was then removed leaving the wire place. A dual lumen catheter was used to introduce a second sensor wire. One was snapped in place as a safety wire and the other was used as a working wire. A 35 cm Cook access sheath was then advanced easily under fluoroscopic guidance the proximal ureter and the inner cannula was removed. A flexible Wolf digital ureteroscope was then advanced through the access sheath up into the kidney. Formal pyeloscopy was performed revealing some small stone fragments within the upper pole, and larger stones within the lower pole. The largest stone burden was identified within a lower pole calyx. A 200  fiber was then brought in and using settings of 0.2 J and 40 Hz, the stones were dusted. One stone within the lower pole was noted to be submucosal with a thin layer of mucosa overlying the stone. Once this thin layer of mucosa was ablated, more stones were identified within this calyx. Additionally, there were 2 infundibula one in the lower pole and one in the mid pole with a very narrow infundibula. I was unable to access the lower pole calyx with the most significant infundibular stenosis. Next, a basket use used to extract all significant stone fragments. Once the kidney was completely clear of any significant stone fragment, the scope was backed to level of the UPJ. A gentle retrograde pyelogram was performed again using this as a roadmap. Again, a second look was performed to ensure that each and every calyx was clear. Once this was deemed adequate, the scope was backed down the length of the ureter removing the access sheath along the way. There were no ureteral  injuries noted or additional stone fragments within the ureter. A 6 x 24 French double-J ureteral stent was advanced over the wire up to level of the kidney under fluoroscopic guidance. The  wire was partially drawn until full coil was noted within the pelvis. The wire was then fully withdrawn until full coil was noted within the bladder. The bladder was then drained. The patient's left inner thigh was shaved and the stent string which was left in place was attached to the left inner thigh using Mastisol and Tegaderm. She was then reversed from anesthesia, taken to the PACU in stable condition.  Plan: Patient will remove her stent in 5 days.  She will follow up in 4 weeks with a renal ultrasound prior.  Hollice Espy, M.D.

## 2015-07-23 NOTE — Telephone Encounter (Signed)
LMOM to notify pt that per Dr Erlene Quan she does not need to return for surgery on 07/29/15 as previously planned. Surgery cancelled with [facility].

## 2015-07-23 NOTE — Interval H&P Note (Signed)
History and Physical Interval Note:  07/23/2015 8:24 AM  Felicia Manning  has presented today for surgery, with the diagnosis of LEFT KIDNEY STONE  The various methods of treatment have been discussed with the patient and family. After consideration of risks, benefits and other options for treatment, the patient has consented to  Procedure(s): CYSTOSCOPY/URETEROSCOPY/HOLMIUM LASER/STENT PLACEMENT (Left) as a surgical intervention .  The patient's history has been reviewed, patient examined, no change in status, stable for surgery.  I have reviewed the patient's chart and labs.  Questions were answered to the patient's satisfaction.    RRR CTAB  Hollice Espy

## 2015-07-23 NOTE — OR Nursing (Signed)
Pr c/o small pea sized bruise at corner of right eye, an a discoloration in the same place on the leftnoticed while up to bathroom.  Examined by L curtis CRNA, who states there was nothing that could of caused the bruising besides other than a strong coughing spell while waking up laying down flat.  Pt disch stable no problems

## 2015-07-23 NOTE — Anesthesia Postprocedure Evaluation (Signed)
Anesthesia Post Note  Patient: [name]  Procedure(s) Performed: Procedure(s) (LRB): CYSTOSCOPY/URETEROSCOPY/HOLMIUM LASER/STENT PLACEMENT/RETROGRADE PYELOGRAM (Left)  Patient location during evaluation: PACU Anesthesia Type: General Level of consciousness: awake Pain management: pain level controlled Vital Signs Assessment: post-procedure vital signs reviewed and stable Respiratory status: respiratory function stable Cardiovascular status: stable Anesthetic complications: no    Last Vitals:  Filed Vitals:   07/23/15 1050 07/23/15 1105  BP: 133/56 119/61  Pulse: 82 82  Temp: 36.4 C   Resp: 25 22    Last Pain: There were no vitals filed for this visit.               VAN STAVEREN,Emilynn Srinivasan

## 2015-07-23 NOTE — H&P (View-Only) (Signed)
5:18 PM  06/24/2015   Felicia Manning [birth year] [id number]  Referring provider: Glendon Axe, MD [place] [facility] [place], [place] [address]  Chief Complaint  Patient presents with  . Results    4wk with u/s results    HPI:  60 year old female who presented to the emergency room on 04/08/2015 with 3 days of fever, flank pain, and a positive UA. She had multiple nonobstructing kidney stones, right greater than left. No obstructing stones. She was admitted and treated for pyelonephritis.  Urine culture ultimately grew pansensitive Escherichia coli she was appropriately treated. Follow-up urine cultures were negative.  She underwent right stage ureteroscopy on 05/19/2015 followed by 05/26/2015. This was uncomplicated with considerable stone burden. and she removed her stent on a string.  She was noted to have an area of infundibular stenosis with a nonobstructing stone within this narrowed infundibulum.  Follow-up renal ultrasound does show a 9 mm right-sided residual stone within a midpole calyx, likely residual stone within the calyx with the stenotic infundibulum.  No postoperative issues. Her flank pain has since completely resolved.  Stone analysis consistent with 65% calcium oxalate monohydrate, 3% calcium oxalate dihydrate, and 32% calcium phosphate.  She does have a personal history of stones which have required surgical mention on multiple occasions including ureteroscopy and ESWL. Her last intervention prior to stent placement was 3-4 years ago prior to this.  She returns today to discuss definitive management of her left sided stones.  PMH: Past Medical History  Diagnosis Date  . Hypertension   . Hypothyroidism   . GERD (gastroesophageal reflux disease)   . Barrett's esophagus   . Nephrolithiasis   . Thyroid cancer ([place]) 2012  . Thyroid cancer ([place])   . Hepatitis C 2008  . Dysrhythmia     stress related at work.    Surgical  History: Past Surgical History  Procedure Laterality Date  . Colonoscopy with propofol N/A 04/25/2015    Procedure: COLONOSCOPY WITH PROPOFOL;  Surgeon: Josefine Class, MD;  [facility];  Service: Endoscopy;  Laterality: N/A;  . Esophagogastroduodenoscopy (egd) with propofol N/A 04/25/2015    Procedure: ESOPHAGOGASTRODUODENOSCOPY (EGD) WITH PROPOFOL;  Surgeon: Josefine Class, MD;  Location: [facility] ENDOSCOPY;  Service: Endoscopy;  Laterality: N/A;  . Thyroidectomy  2010  . Abdominal hysterectomy  2006  . Lithotripsy  2009  . Eye surgery Bilateral 1964    lazy eye repair  . Eye surgery Bilateral 2001    lazy eye repair  . Ureteroscopy with holmium laser lithotripsy Right 05/19/2015    Procedure: URETEROSCOPY WITH HOLMIUM LASER LITHOTRIPSY;  Surgeon: Hollice Espy, MD;  Location: [facility] ORS;  Service: Urology;  Laterality: Right;  . Cystoscopy with stent placement Right 05/19/2015    Procedure: CYSTOSCOPY WITH STENT PLACEMENT;  Surgeon: Hollice Espy, MD;  Location: [facility] ORS;  Service: Urology;  Laterality: Right;  . Cystoscopy w/ retrogrades Bilateral 05/19/2015    Procedure: CYSTOSCOPY WITH RETROGRADE PYELOGRAM;  Surgeon: Hollice Espy, MD;  Location: [facility] ORS;  Service: Urology;  Laterality: Bilateral;  . Ureteroscopy with holmium laser lithotripsy Right 05/26/2015    Procedure: URETEROSCOPY WITH HOLMIUM LASER LITHOTRIPSY;  Surgeon: Hollice Espy, MD;  Location: [facility] ORS;  Service: Urology;  Laterality: Right;  . Cystoscopy w/ ureteral stent placement Right 05/26/2015    Procedure: CYSTOSCOPY WITH STENT REPLACEMENT;  Surgeon: Hollice Espy, MD;  Location: [facility] ORS;  Service: Urology;  Laterality: Right;  . Cystoscopy with stent placement Right 05/26/2015  Procedure: CYSTOSCOPY WITH STENT PLACEMENT;  Surgeon: Hollice Espy, MD;  Location: [facility] ORS;  Service: Urology;  Laterality: Right;    Home Medications:    Medication List       This list is accurate as of: 06/24/15   5:18 PM.  Always use your most recent med list.               HARVONI 90-400 MG Tabs  Generic drug:  Ledipasvir-Sofosbuvir     levothyroxine 150 MCG tablet  Commonly known as:  SYNTHROID, LEVOTHROID  Take 150 mcg by mouth at bedtime. Take with a full glass of water at least 30 minutes before breakfast     losartan 50 MG tablet  Commonly known as:  COZAAR  TAKE 1 TABLET (50 MG TOTAL) BY MOUTH ONCE DAILY in am.     metoprolol succinate 25 MG 24 hr tablet  Commonly known as:  TOPROL-XL  Take 25 mg by mouth every morning.     omeprazole 20 MG capsule  Commonly known as:  PRILOSEC  TAKE 1 CAPSULE (20 MG TOTAL) BY MOUTH ONCE DAILY.        Allergies: No Known Allergies  Family History: Family History  Problem Relation Age of Onset  . CAD Mother   . Bladder Cancer Neg Hx   . Prostate cancer Neg Hx   . Kidney cancer Neg Hx     Social History:  reports that she has been smoking Cigarettes.  She has been smoking about 1.00 pack per day. She has never used smokeless tobacco. She reports that she does not drink alcohol or use illicit drugs.  ROS: UROLOGY Frequent Urination?: Yes Hard to postpone urination?: Yes Burning/pain with urination?: No Get up at night to urinate?: No Leakage of urine?: Yes Urine stream starts and stops?: No Trouble starting stream?: No Do you have to strain to urinate?: No Blood in urine?: No Urinary tract infection?: No Sexually transmitted disease?: No Injury to kidneys or bladder?: No Painful intercourse?: No Weak stream?: No Currently pregnant?: No Vaginal bleeding?: No Last menstrual period?: n  Gastrointestinal Nausea?: No Vomiting?: No Indigestion/heartburn?: No Diarrhea?: Yes Constipation?: No  Constitutional Fever: No Night sweats?: No Weight loss?: No Fatigue?: Yes  Skin Skin rash/lesions?: No Itching?: No  Eyes Blurred vision?: No Double vision?: No  Ears/Nose/Throat Sore throat?: Yes Sinus problems?:  No  Hematologic/Lymphatic Swollen glands?: No Easy bruising?: No  Cardiovascular Leg swelling?: No Chest pain?: No  Respiratory Cough?: Yes Shortness of breath?: No  Endocrine Excessive thirst?: No  Musculoskeletal Back pain?: No Joint pain?: No  Neurological Headaches?: No Dizziness?: No  Psychologic Depression?: No Anxiety?: No  Physical Exam: BP 171/85 mmHg  Pulse 74  Ht '5\' 6"'$  (1.676 m)  Wt 185 lb (83.915 kg)  BMI 29.87 kg/m2  Constitutional:  Alert and oriented, No acute distress. HEENT: [place] AT, moist mucus membranes.  Trachea midline, no masses. Cardiovascular: No clubbing, cyanosis, or edema. RRR. Respiratory: Normal respiratory effort, no increased work of breathing. CTAB. GI: Abdomen is soft, nontender, nondistended, no abdominal masses GU: No CVA tenderness.  Skin: No rashes, bruises or suspicious lesions. Lymph: No cervical or inguinal adenopathy. Neurologic: Grossly intact, no focal deficits, moving all 4 extremities. Psychiatric: Normal mood and affect.  Laboratory Data: Lab Results  Component Value Date   WBC 9.7 04/09/2015   HGB 12.1 04/09/2015   HCT 36.6 04/09/2015   MCV 87.9 04/09/2015   PLT 169 04/09/2015    Lab Results  Component  Value Date   CREATININE 0.72 04/09/2015   Urinalysis Results for orders placed or performed in visit on 06/24/15  Microscopic Examination  Result Value Ref Range   WBC, UA 0-5 0 -  5 /hpf   RBC, UA None seen 0 -  2 /hpf   Epithelial Cells (non renal) 0-10 0 - 10 /hpf   Bacteria, UA Few (A) None seen/Few  Urinalysis, Complete  Result Value Ref Range   Specific Gravity, UA <1.005 (L) 1.005 - 1.030   pH, UA 6.5 5.0 - 7.5   Color, UA Yellow Yellow   Appearance Ur Clear Clear   Leukocytes, UA 1+ (A) Negative   Protein, UA Negative Negative/Trace   Glucose, UA Negative Negative   Ketones, UA Negative Negative   RBC, UA Trace (A) Negative   Bilirubin, UA Negative Negative   Urobilinogen, Ur 0.2 0.2 -  1.0 mg/dL   Nitrite, UA Negative Negative   Microscopic Examination See below:     Pertinent Imaging:      Study Result     CLINICAL DATA: Kidney stones. Status post lithotripsy.  EXAM: RENAL / URINARY TRACT ULTRASOUND COMPLETE  COMPARISON: 04/22/15  FINDINGS: Right Kidney:  Length: 12.1 cm. There is a cystic lesion containing internal septation arising from the lateral aspect of the mid left kidney measuring 5.8 by 4.4 x 5.6 cm. Simple appearing cyst arises from the medial aspect of the right kidney measuring 4.8 x 3.0 x 4.3 cm. 9 mm calculus is identified within the midpole of the right kidney.  Left Kidney:  Length: 11.7 cm. Stone within the inferior pole measures 9.7 mm. There is a simple cyst arising from the inferior pole measuring 1.7 x 1.3 x 1.2 cm. Echogenicity within normal limits. No mass or hydronephrosis visualized.  Bladder:  Appears normal for degree of bladder distention.  IMPRESSION: 1. Bilateral nonobstructing renal calculi. 2. Complicated cyst containing internal septation arising from the midpole of right kidney. Recommend further investigation with dedicated renal protocol MRI.   Electronically Signed  By: Kerby Moors M.D.  On: 06/16/2015 14:36     SSD 8.5 cm HFu 820   Assessment & Plan:    1. Nephrolithiasis Right greater than left nonobstructing nephrolithiasis status post stage right ureteroscopy. Follow-up renal ultrasound shows 1 residual 9 mm stone, presumably within the calyx with stenotic infundibulum, not accessible ureteroscopically--> will continue to follow  Plan for left-sided multiple nonobstructing stones discussed. She would like to proceed with left to staged ureteroscopy as this is well tolerated on the right side. We reviewed the risks benefits and she is familiar with the procedure. She is willing to proceed as planned. - Urinalysis, Complete  2. History of pyelonephritis No evidence of  infection today, preoperative urine culture repeated  3. 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 We'll reevaluate this with follow-up renal ultrasound following left ureteroscopy.    May consider follow-up MRI in the future.   Schedule staged L URS, LL, stent   Hollice Espy, MD  [facility] [address], [place], [place] [address] [phone]  I spent 25 min with this patient of which greater than 50% was spent in counseling and coordination of care with the patient.

## 2015-07-28 ENCOUNTER — Telehealth: Payer: Self-pay | Admitting: Urology

## 2015-07-28 NOTE — Telephone Encounter (Signed)
Patient called the after hours nurse line on 07/26/2015 at 12:02pm.  She states that she had a laser lithotripsy and stent placement left ureter - she states she still has blood in her urine which is visible.  She did not see any blood on Thursday until that evening when it started.   Nurse spoke to Dr. Erlene Quan and she advised that it was totally normal to have bleeding for the full time that the stent is in especially if up walking around or if getting dehydrated.  Information was provided to the patient who said that the paperwork only said bleeding the first 24 hours so she was concerned but feels a lot better now and she states she will start drinking more fluids.

## 2015-07-29 ENCOUNTER — Ambulatory Visit: Admit: 2015-07-29 | Payer: Managed Care, Other (non HMO) | Admitting: Urology

## 2015-07-29 SURGERY — CYSTOSCOPY/URETEROSCOPY/HOLMIUM LASER/STENT PLACEMENT
Anesthesia: Choice | Laterality: Left

## 2015-08-25 ENCOUNTER — Ambulatory Visit
Admission: RE | Admit: 2015-08-25 | Discharge: 2015-08-25 | Disposition: A | Discharge status: Home / Self Care | Payer: Managed Care, Other (non HMO) | Source: Ambulatory Visit | Attending: Urology | Admitting: Urology

## 2015-08-25 DIAGNOSIS — Q6102 Congenital multiple renal cysts: Secondary | ICD-10-CM | POA: Insufficient documentation

## 2015-08-25 DIAGNOSIS — N200 Calculus of kidney: Secondary | ICD-10-CM | POA: Insufficient documentation

## 2015-08-27 ENCOUNTER — Encounter: Payer: Self-pay | Admitting: Urology

## 2015-08-27 ENCOUNTER — Ambulatory Visit (INDEPENDENT_AMBULATORY_CARE_PROVIDER_SITE_OTHER): Payer: Managed Care, Other (non HMO) | Admitting: Urology

## 2015-08-27 VITALS — BP 156/92 | HR 64 | Ht 66.0 in | Wt 185.0 lb

## 2015-08-27 DIAGNOSIS — N200 Calculus of kidney: Secondary | ICD-10-CM

## 2015-08-27 DIAGNOSIS — Q6100 Congenital renal cyst, unspecified: Secondary | ICD-10-CM

## 2015-08-27 DIAGNOSIS — N281 Cyst of kidney, acquired: Secondary | ICD-10-CM

## 2015-08-27 NOTE — Progress Notes (Signed)
11:22 AM  08/27/2015   Felicia Manning [birth year] [id number]  Referring provider: Glendon Axe, MD [facility] [place], [place] [address]  Chief Complaint  Patient presents with  . Routine Post Op    stent f/u     HPI:  60 year old female who presented to the emergency room on 04/08/2015 with 3 days of fever, flank pain, and a positive UA. She had multiple nonobstructing kidney stones, right greater than left. No obstructing stones. She was admitted and treated for pyelonephritis.  Urine culture ultimately grew pansensitive Escherichia coli she was appropriately treated. Follow-up urine cultures were negative.  She underwent right stage ureteroscopy on 05/19/2015 followed by 05/26/2015. This was uncomplicated with considerable stone burden. and she removed her stent on a string.  She was noted to have an area of infundibular stenosis with a nonobstructing stone within this narrowed infundibulum.  Follow-up renal ultrasound does show a 9 mm right-sided residual stone within a midpole calyx, likely residual stone within the calyx with the stenotic infundibulum.  She returned to th OR on 07/23/15 for left ureteroscopy which was well tolerated.     Stone analysis consistent with 65% calcium oxalate monohydrate, 3% calcium oxalate dihydrate, and 32% calcium phosphate.  She does have a personal history of stones which have required surgical mention on multiple occasions including ureteroscopy and ESWL. Her last intervention prior to stent placement was 3-4 years ago prior to this.  She was recently diagnosed severe osteoporosis and has been advised to start calcium 600 mg daily and vit D supplementation.  She does drink plenty of water.    Follow up renal ultrasound shows stable right nonobstructing stone     PMH: Past Medical History  Diagnosis Date  . Hypertension   . Hypothyroidism   . GERD (gastroesophageal reflux disease)   . Barrett's esophagus     . Nephrolithiasis   . Thyroid cancer ([place]) 2012  . Thyroid cancer ([place])   . Hepatitis C 2008  . Dysrhythmia     stress related at work.    Surgical History: Past Surgical History  Procedure Laterality Date  . Colonoscopy with propofol N/A 04/25/2015    Procedure: COLONOSCOPY WITH PROPOFOL;  Surgeon: Josefine Class, MD;  Location: [facility] ENDOSCOPY;  Service: Endoscopy;  Laterality: N/A;  . Esophagogastroduodenoscopy (egd) with propofol N/A 04/25/2015    Procedure: ESOPHAGOGASTRODUODENOSCOPY (EGD) WITH PROPOFOL;  Surgeon: Josefine Class, MD;  Location: [facility] ENDOSCOPY;  Service: Endoscopy;  Laterality: N/A;  . Thyroidectomy  2010  . Abdominal hysterectomy  2006  . Lithotripsy  2009  . Eye surgery Bilateral 1964    lazy eye repair  . Eye surgery Bilateral 2001    lazy eye repair  . Ureteroscopy with holmium laser lithotripsy Right 05/19/2015    Procedure: URETEROSCOPY WITH HOLMIUM LASER LITHOTRIPSY;  Surgeon: Hollice Espy, MD;  Location: [facility] ORS;  Service: Urology;  Laterality: Right;  . Cystoscopy with stent placement Right 05/19/2015    Procedure: CYSTOSCOPY WITH STENT PLACEMENT;  Surgeon: Hollice Espy, MD;  Location: [facility] ORS;  Service: Urology;  Laterality: Right;  . Cystoscopy w/ retrogrades Bilateral 05/19/2015    Procedure: CYSTOSCOPY WITH RETROGRADE PYELOGRAM;  Surgeon: Hollice Espy, MD;  Location: [facility] ORS;  Service: Urology;  Laterality: Bilateral;  . Ureteroscopy with holmium laser lithotripsy Right 05/26/2015    Procedure: URETEROSCOPY WITH HOLMIUM LASER LITHOTRIPSY;  Surgeon: Hollice Espy, MD;  Location: [facility] ORS;  Service: Urology;  Laterality: Right;  . Cystoscopy w/  ureteral stent placement Right 05/26/2015    Procedure: CYSTOSCOPY WITH STENT REPLACEMENT;  Surgeon: Hollice Espy, MD;  Location: [facility] ORS;  Service: Urology;  Laterality: Right;  . Cystoscopy with stent placement Right 05/26/2015    Procedure: CYSTOSCOPY WITH STENT PLACEMENT;  Surgeon: Hollice Espy, MD;  Location: [facility] ORS;  Service: Urology;  Laterality: Right;  . Cystoscopy/ureteroscopy/holmium laser/stent placement Left 07/23/2015    Procedure: CYSTOSCOPY/URETEROSCOPY/HOLMIUM LASER/STENT PLACEMENT/RETROGRADE PYELOGRAM;  Surgeon: Hollice Espy, MD;  Location: [facility] ORS;  Service: Urology;  Laterality: Left;    Home Medications:    Medication List       This list is accurate as of: 08/27/15 11:22 AM.  Always use your most recent med list.               docusate sodium 100 MG capsule  Commonly known as:  COLACE  Take 1 capsule (100 mg total) by mouth 2 (two) times daily.     HARVONI PO  Take by mouth.     HYDROcodone-acetaminophen 5-325 MG tablet  Commonly known as:  NORCO/VICODIN  Take 1-2 tablets by mouth every 6 (six) hours as needed for moderate pain.     levothyroxine 150 MCG tablet  Commonly known as:  SYNTHROID, LEVOTHROID  Take 150 mcg by mouth at bedtime. Take with a full glass of water at least 30 minutes before breakfast     losartan 50 MG tablet  Commonly known as:  COZAAR  TAKE 1 TABLET (50 MG TOTAL) BY MOUTH ONCE DAILY in am.     metoprolol succinate 25 MG 24 hr tablet  Commonly known as:  TOPROL-XL  Take 25 mg by mouth every morning.     omeprazole 20 MG capsule  Commonly known as:  PRILOSEC  TAKE 1 CAPSULE (20 MG TOTAL) BY MOUTH ONCE DAILY-AM     oxybutynin 5 MG tablet  Commonly known as:  DITROPAN  Take 1 tablet (5 mg total) by mouth every 8 (eight) hours as needed for bladder spasms.     tamsulosin 0.4 MG Caps capsule  Commonly known as:  FLOMAX  Take 1 capsule (0.4 mg total) by mouth daily.        Allergies: No Known Allergies  Family History: Family History  Problem Relation Age of Onset  . CAD Mother   . Bladder Cancer Neg Hx   . Prostate cancer Neg Hx   . Kidney cancer Neg Hx     Social History:  reports that she has been smoking Cigarettes.  She has been smoking about 1.00 pack per day. She has never used smokeless  tobacco. She reports that she does not drink alcohol or use illicit drugs.  ROS: UROLOGY Frequent Urination?: Yes Hard to postpone urination?: Yes Burning/pain with urination?: No Get up at night to urinate?: No Leakage of urine?: Yes Urine stream starts and stops?: No Trouble starting stream?: No Do you have to strain to urinate?: No Blood in urine?: No Urinary tract infection?: No Sexually transmitted disease?: No Injury to kidneys or bladder?: No Painful intercourse?: No Weak stream?: No Currently pregnant?: No Vaginal bleeding?: No Last menstrual period?: No  Gastrointestinal Nausea?: No Vomiting?: No Indigestion/heartburn?: No Diarrhea?: No Constipation?: No  Constitutional Fever: No Night sweats?: No Weight loss?: No  Skin Skin rash/lesions?: No Itching?: No  Eyes Blurred vision?: No Double vision?: No  Ears/Nose/Throat Sore throat?: No Sinus problems?: No  Hematologic/Lymphatic Easy bruising?: No  Cardiovascular Leg swelling?: No Chest pain?: No  Respiratory Cough?: No  Endocrine Excessive thirst?: No  Musculoskeletal Back pain?: No Joint pain?: No  Neurological Headaches?: No  Psychologic Depression?: No Anxiety?: No  Physical Exam: BP 156/92 mmHg  Pulse 64  Ht '5\' 6"'$  (1.676 m)  Wt 185 lb (83.915 kg)  BMI 29.87 kg/m2  Constitutional:  Alert and oriented, No acute distress. HEENT: [place] AT, moist mucus membranes.  Trachea midline, no masses. Cardiovascular: No clubbing, cyanosis, or edema.  Respiratory: Normal respiratory effort, no increased work of breathing.  GI: Abdomen is soft, nontender, nondistended, no abdominal masses GU: No CVA tenderness.  Skin: No rashes, bruises or suspicious lesions. Neurologic: Grossly intact, no focal deficits, moving all 4 extremities. Psychiatric: Normal mood and affect.  Laboratory Data: Lab Results  Component Value Date   WBC 9.7 04/09/2015   HGB 12.1 04/09/2015   HCT 36.6 04/09/2015    MCV 87.9 04/09/2015   PLT 169 04/09/2015    Lab Results  Component Value Date   CREATININE 0.72 04/09/2015   Pertinent Imaging:    Study Result     CLINICAL DATA: Status post left ureteroscopy for stones  EXAM: RENAL / URINARY TRACT ULTRASOUND COMPLETE  COMPARISON: Renal ultrasound of Jun 16, 2015  FINDINGS: Right Kidney:  Length: 11.6 cm. The renal cortical echotexture remains lower than that of the adjacent liver. There are multiple shadowing kidney stones observed. There is no hydronephrosis. There is a cyst exophytic from the mid upper pole measuring 5 cm in greatest dimension. A parapelvic cyst measures 3.9 cm in diameter.  Left Kidney:  Length: 11.8 cm. The renal cortical echotexture is normal similar to that on the right. There is a simple appearing parapelvic cyst measuring 1.3 cm in diameter in the mid upper pole. There is no hydronephrosis. No stones are evident.  Bladder:  Appears normal for degree of bladder distention.  IMPRESSION: 1. Nonobstructing stones in the right kidney with the largest measuring 8 mm in diameter. Cortical and parapelvic cysts in the right kidney. Small parapelvic cyst in the left kidney. No left kidney stones observed. The renal cortical echotexture both kidneys is normal. 2. Normal appearance of the urinary bladder.   Electronically Signed  By: David Martinique M.D.  On: 08/25/2015 13:30    RUS personally reviewed today  Assessment & Plan:    1. Nephrolithiasis S/p recent staged right ureteroscopy and left ureteroscopy Persistent stone on the right measuring 8 mm likely representing the stone within a stenotic infundibulum, difficult to access ureteroscopically, no residual hydronephrosis  No flank pain I have recommended a repeat [id number] urine metabolic workup given her significant bilateral stone burden- we'll perform while taking calcium supplementation We reviewed stone diet information, handout  given today. Ideally, she would not take supplemental calcium but given her severe osteoporosis, she may need to despite her kidney stones.    2. Right renal cyst Incidental complex Bosniak 2 right renal cyst on renal ultrasound on 06/16/15 ---> no appreciated on most recent utlrasound  Return in about 2 months (around 10/28/2015) for f/u litholink.   Hollice Espy, MD  [facility] [address] [place], [place] [address] [phone]

## 2015-10-16 ENCOUNTER — Other Ambulatory Visit: Payer: Self-pay | Admitting: Student

## 2015-10-16 DIAGNOSIS — K824 Cholesterolosis of gallbladder: Secondary | ICD-10-CM

## 2015-10-23 ENCOUNTER — Ambulatory Visit
Admission: RE | Admit: 2015-10-23 | Discharge: 2015-10-23 | Disposition: A | Discharge status: Home / Self Care | Payer: Managed Care, Other (non HMO) | Source: Ambulatory Visit | Attending: Student | Admitting: Student

## 2015-10-23 DIAGNOSIS — K824 Cholesterolosis of gallbladder: Secondary | ICD-10-CM | POA: Insufficient documentation

## 2015-10-23 DIAGNOSIS — N289 Disorder of kidney and ureter, unspecified: Secondary | ICD-10-CM | POA: Insufficient documentation

## 2015-10-23 DIAGNOSIS — R932 Abnormal findings on diagnostic imaging of liver and biliary tract: Secondary | ICD-10-CM | POA: Diagnosis not present

## 2015-11-06 ENCOUNTER — Other Ambulatory Visit: Payer: Managed Care, Other (non HMO)

## 2015-11-11 ENCOUNTER — Encounter: Payer: Self-pay | Admitting: *Deleted

## 2015-11-14 ENCOUNTER — Other Ambulatory Visit: Payer: Self-pay | Admitting: Urology

## 2015-12-01 ENCOUNTER — Ambulatory Visit (INDEPENDENT_AMBULATORY_CARE_PROVIDER_SITE_OTHER): Payer: Managed Care, Other (non HMO) | Admitting: General Surgery

## 2015-12-01 ENCOUNTER — Encounter: Payer: Self-pay | Admitting: General Surgery

## 2015-12-01 VITALS — Ht 66.0 in | Wt 187.0 lb

## 2015-12-01 DIAGNOSIS — R222 Localized swelling, mass and lump, trunk: Secondary | ICD-10-CM

## 2015-12-01 DIAGNOSIS — R2231 Localized swelling, mass and lump, right upper limb: Secondary | ICD-10-CM

## 2015-12-01 NOTE — Progress Notes (Signed)
Patient ID: Felicia Manning, female   DOB: [birth year], 60 y.o.   MRN: [id number]  Chief Complaint  Patient presents with  . Cyst    HPI Felicia Manning is a 60 y.o. female is here today for an evaluation of cysts. Patient states she has had the cysts since 2012. The cyst on her back gets inflamed and drains. The cyst on her right arm keeps getting bigger and no drainage.  HPI  Past Medical History:  Diagnosis Date  . Barrett's esophagus   . Dysrhythmia    stress related at work.  Marland Kitchen GERD (gastroesophageal reflux disease)   . Hepatitis C 2008  . Hyperlipidemia   . Hypertension   . Hypothyroidism   . Nephrolithiasis   . Osteoporosis   . Thyroid cancer ([place]) 2012  . Thyroid cancer [facility])     Past Surgical History:  Procedure Laterality Date  . ABDOMINAL HYSTERECTOMY  2006  . COLONOSCOPY WITH PROPOFOL N/A 04/25/2015   Procedure: COLONOSCOPY WITH PROPOFOL;  Surgeon: Josefine Class, MD;  Location: [facility] ENDOSCOPY;  Service: Endoscopy;  Laterality: N/A;  . CYSTOSCOPY W/ RETROGRADES Bilateral 05/19/2015   Procedure: CYSTOSCOPY WITH RETROGRADE PYELOGRAM;  Surgeon: Hollice Espy, MD;  Location: [facility] ORS;  Service: Urology;  Laterality: Bilateral;  . CYSTOSCOPY W/ URETERAL STENT PLACEMENT Right 05/26/2015   Procedure: CYSTOSCOPY WITH STENT REPLACEMENT;  Surgeon: Hollice Espy, MD;  Location: [facility] ORS;  Service: Urology;  Laterality: Right;  . CYSTOSCOPY WITH STENT PLACEMENT Right 05/19/2015   Procedure: CYSTOSCOPY WITH STENT PLACEMENT;  Surgeon: Hollice Espy, MD;  Location: [facility] ORS;  Service: Urology;  Laterality: Right;  . CYSTOSCOPY WITH STENT PLACEMENT Right 05/26/2015   Procedure: CYSTOSCOPY WITH STENT PLACEMENT;  Surgeon: Hollice Espy, MD;  Location: [facility] ORS;  Service: Urology;  Laterality: Right;  . CYSTOSCOPY/URETEROSCOPY/HOLMIUM LASER/STENT PLACEMENT Left 07/23/2015   Procedure: CYSTOSCOPY/URETEROSCOPY/HOLMIUM LASER/STENT PLACEMENT/RETROGRADE PYELOGRAM;  Surgeon: Hollice Espy, MD;  Location: [facility] ORS;  Service: Urology;  Laterality: Left;  . ESOPHAGOGASTRODUODENOSCOPY (EGD) WITH PROPOFOL N/A 04/25/2015   Procedure: ESOPHAGOGASTRODUODENOSCOPY (EGD) WITH PROPOFOL;  Surgeon: Josefine Class, MD;  Location: [facility] ENDOSCOPY;  Service: Endoscopy;  Laterality: N/A;  . EYE SURGERY Bilateral 1964   lazy eye repair  . EYE SURGERY Bilateral 2001   lazy eye repair  . LITHOTRIPSY  2009  . THYROIDECTOMY  2010  . URETEROSCOPY WITH HOLMIUM LASER LITHOTRIPSY Right 05/19/2015   Procedure: URETEROSCOPY WITH HOLMIUM LASER LITHOTRIPSY;  Surgeon: Hollice Espy, MD;  Location: [facility] ORS;  Service: Urology;  Laterality: Right;  . URETEROSCOPY WITH HOLMIUM LASER LITHOTRIPSY Right 05/26/2015   Procedure: URETEROSCOPY WITH HOLMIUM LASER LITHOTRIPSY;  Surgeon: Hollice Espy, MD;  Location: [facility] ORS;  Service: Urology;  Laterality: Right;    Family History  Problem Relation Age of Onset  . CAD Mother   . Bladder Cancer Neg Hx   . Prostate cancer Neg Hx   . Kidney cancer Neg Hx     Social History Social History  Substance Use Topics  . Smoking status: Current Every Day Smoker    Packs/day: 1.00    Types: Cigarettes  . Smokeless tobacco: Never Used  . Alcohol use No    No Known Allergies  Current Outpatient Prescriptions  Medication Sig Dispense Refill  . Ledipasvir-Sofosbuvir (HARVONI PO) Take by mouth.    . levothyroxine (SYNTHROID, LEVOTHROID) 150 MCG tablet Take 150 mcg by mouth at bedtime. Take with a full glass of water at least 30 minutes before breakfast  1  . losartan (COZAAR)  50 MG tablet TAKE 1 TABLET (50 MG TOTAL) BY MOUTH ONCE DAILY in am.  3  . metoprolol succinate (TOPROL-XL) 25 MG 24 hr tablet Take 25 mg by mouth every morning.   1  . omeprazole (PRILOSEC) 20 MG capsule TAKE 1 CAPSULE (20 MG TOTAL) BY MOUTH ONCE DAILY-AM  3   No current facility-administered medications for this visit.     Review of Systems Review of Systems  Constitutional:  Negative.   Respiratory: Negative.   Cardiovascular: Negative.     Height '5\' 6"'$  (1.676 m), weight 187 lb (84.8 kg).  Physical Exam Physical Exam  Constitutional: She is oriented to person, place, and time. She appears well-developed and well-nourished.  Cardiovascular: Normal rate, regular rhythm and normal heart sounds.   Pulmonary/Chest: Effort normal and breath sounds normal.  Musculoskeletal:       Arms: Neurological: She is alert and oriented to person, place, and time.  Skin: Skin is warm and dry.       Data Reviewed Felicia Manning reviewed.  Assessment    Symptomatic sebaceous cysts of the right back and right forearm.    Plan    It was elected to proceed with excision today. The areas were both cleansed with alcohol followed by the injection of 10 mL of 0.5% Xylocaine with 0.25% Marcaine with 1-200,000 of epinephrine. The forearm was approached first through a longitudinal incision. The cyst was extracted intact. The deep tissue was approximated with a single 4-0 Vicryl figure-of-eight suture. The skin was closed with a running 4-0 Vicryl subcuticular suture. Benzoin, Steri-Strips, Telfa and Tegaderm dressing applied.  The patient was rolled to the prone position. The area was prepped with ChloraPrep and draped. Through a transverse incision the mass was excised. The cyst wall was completely removed. Deep tissue was approximated with 3-0 Vicryl figure-of-eight suture. The skin was closed with a running 4-0 Vicryl subcuticular suture. Benzoin, Steri-Strips, Telfa and Tegaderm dressing applied.  The patient tolerated both procedures well. She was instructed in regards to wound care. Outer plastic bandage may be removed in 2 days. She is welcome return for staph wound check if desired, otherwise she's been asked to call one week with a progress report.     This information has been scribed by Felicia Manning CMA.   Felicia Manning 12/01/2015, 9:07 PM

## 2015-12-01 NOTE — Patient Instructions (Signed)
Return

## 2015-12-03 ENCOUNTER — Telehealth: Payer: Self-pay

## 2015-12-03 NOTE — Telephone Encounter (Signed)
Notified patient as instructed, patient pleased. Discussed follow-up appointment if needed, patient agrees.

## 2015-12-03 NOTE — Telephone Encounter (Signed)
-----   Message from Robert Bellow, MD sent at 12/03/2015 12:43 PM EDT ----- Please notify the results of the cyst removal showed benign lesions. No scheduled follow-up flush she has concerns regarding wound healing. ----- Message ----- From: Interface, Lab In [facility] Seven Sent: 12/03/2015  10:53 AM To: Robert Bellow, MD

## 2016-06-17 ENCOUNTER — Encounter: Payer: Self-pay | Admitting: *Deleted

## 2016-06-17 ENCOUNTER — Emergency Department: Payer: 59

## 2016-06-17 ENCOUNTER — Telehealth: Payer: Self-pay

## 2016-06-17 ENCOUNTER — Emergency Department
Admission: EM | Admit: 2016-06-17 | Discharge: 2016-06-17 | Disposition: A | Discharge status: Home / Self Care | Payer: 59 | Attending: Emergency Medicine | Admitting: Emergency Medicine

## 2016-06-17 DIAGNOSIS — E039 Hypothyroidism, unspecified: Secondary | ICD-10-CM | POA: Insufficient documentation

## 2016-06-17 DIAGNOSIS — R1030 Lower abdominal pain, unspecified: Secondary | ICD-10-CM | POA: Diagnosis present

## 2016-06-17 DIAGNOSIS — Z79899 Other long term (current) drug therapy: Secondary | ICD-10-CM | POA: Diagnosis not present

## 2016-06-17 DIAGNOSIS — N23 Unspecified renal colic: Secondary | ICD-10-CM

## 2016-06-17 DIAGNOSIS — N139 Obstructive and reflux uropathy, unspecified: Secondary | ICD-10-CM

## 2016-06-17 DIAGNOSIS — I10 Essential (primary) hypertension: Secondary | ICD-10-CM | POA: Diagnosis not present

## 2016-06-17 DIAGNOSIS — R109 Unspecified abdominal pain: Secondary | ICD-10-CM

## 2016-06-17 DIAGNOSIS — F17210 Nicotine dependence, cigarettes, uncomplicated: Secondary | ICD-10-CM | POA: Diagnosis not present

## 2016-06-17 DIAGNOSIS — C73 Malignant neoplasm of thyroid gland: Secondary | ICD-10-CM | POA: Insufficient documentation

## 2016-06-17 DIAGNOSIS — R10A1 Flank pain, right side: Secondary | ICD-10-CM

## 2016-06-17 LAB — CBC
HCT: 43.5 % (ref 35.0–47.0)
Hemoglobin: 14.4 g/dL (ref 12.0–16.0)
MCH: 29.9 pg (ref 26.0–34.0)
MCHC: 33.1 g/dL (ref 32.0–36.0)
MCV: 90.6 fL (ref 80.0–100.0)
Platelets: 171 10*3/uL (ref 150–440)
RBC: 4.8 MIL/uL (ref 3.80–5.20)
RDW: 14.4 % (ref 11.5–14.5)
WBC: 19 10*3/uL — ABNORMAL HIGH (ref 3.6–11.0)

## 2016-06-17 LAB — BASIC METABOLIC PANEL
Anion gap: 9 (ref 5–15)
BUN: 12 mg/dL (ref 6–20)
CO2: 25 mmol/L (ref 22–32)
Calcium: 9 mg/dL (ref 8.9–10.3)
Chloride: 104 mmol/L (ref 101–111)
Creatinine, Ser: 0.74 mg/dL (ref 0.44–1.00)
GFR calc Af Amer: >60 mL/min (ref >60)
GFR calc non Af Amer: >60 mL/min (ref >60)
Glucose, Bld: 93 mg/dL (ref 65–99)
Potassium: 4.5 mmol/L (ref 3.5–5.1)
Sodium: 138 mmol/L (ref 135–145)

## 2016-06-17 LAB — URINALYSIS, COMPLETE (UACMP) WITH MICROSCOPIC
Bilirubin Urine: NEGATIVE
Glucose, UA: NEGATIVE mg/dL
Ketones, ur: NEGATIVE mg/dL
Nitrite: NEGATIVE
Protein, ur: NEGATIVE mg/dL
Specific Gravity, Urine: 1.002 — ABNORMAL LOW (ref 1.005–1.030)
pH: 7 (ref 5.0–8.0)

## 2016-06-17 MED ORDER — CIPROFLOXACIN HCL 500 MG PO TABS: 500.0000 mg | ORAL_TABLET | Freq: Two times a day (BID) | ORAL | 0 refills | Status: DC

## 2016-06-17 MED ORDER — CIPROFLOXACIN HCL 500 MG PO TABS
500.0000 mg | ORAL_TABLET | Freq: Once | ORAL | Status: AC
  Administered 2016-06-17: 500 mg via ORAL
  Filled 2016-06-17: qty 1

## 2016-06-17 MED ORDER — ONDANSETRON HCL 4 MG/2ML IJ SOLN
4.0000 mg | Freq: Once | INTRAMUSCULAR | Status: AC
  Administered 2016-06-17: 4 mg via INTRAVENOUS
  Filled 2016-06-17: qty 2

## 2016-06-17 MED ORDER — SODIUM CHLORIDE 0.9 % IV BOLUS (SEPSIS)
1000.0000 mL | Freq: Once | INTRAVENOUS | Status: AC
  Administered 2016-06-17: 1000 mL via INTRAVENOUS

## 2016-06-17 MED ORDER — KETOROLAC TROMETHAMINE 30 MG/ML IJ SOLN
30.0000 mg | Freq: Once | INTRAMUSCULAR | Status: AC
  Administered 2016-06-17: 30 mg via INTRAVENOUS
  Filled 2016-06-17: qty 1

## 2016-06-17 MED ORDER — ONDANSETRON 4 MG PO TBDP: 4.0000 mg | ORAL_TABLET | Freq: Three times a day (TID) | ORAL | 0 refills | Status: DC | PRN

## 2016-06-17 MED ORDER — KETOROLAC TROMETHAMINE 10 MG PO TABS: 10.0000 mg | ORAL_TABLET | Freq: Three times a day (TID) | ORAL | 0 refills | Status: DC | PRN

## 2016-06-17 MED ORDER — OXYCODONE-ACETAMINOPHEN 5-325 MG PO TABS: 1.0000 | ORAL_TABLET | Freq: Four times a day (QID) | ORAL | 0 refills | Status: DC | PRN

## 2016-06-17 NOTE — ED Notes (Signed)
Patient transported to CT 

## 2016-06-17 NOTE — ED Notes (Signed)

## 2016-06-17 NOTE — Telephone Encounter (Signed)
Pt called c/o lower abd and back pressure. Pt stated that last year she let things get to far and ended up staying a week in the hospital with several surgeries for stones and infections. Pt voiced concern of having another stone. Reinforced with pt would need at least a KUB prior to being seen. Offered pt an appt and KUB for [facility] next available. Pt declined. Pt stated that she would go to the ER due to having child care issues.

## 2016-06-17 NOTE — Discharge Instructions (Signed)
Please take Toradol, with food, for mild to moderate pain. Do not take other NSAID medications when you're taking Toradol, this includes Motrin, Advil, ibuprofen, naproxen and Aleve. You may take Percocet for severe pain, but you may not drive within 8 hours of taking this medication. Zofran is for nausea. Please restart your Flomax once daily.  Please call Dr. Cherrie Gauze office, and see if they're able to see you sooner.  Drink plenty of fluid to stay well-hydrated.  Return to the emergency department if you develops severe pain or vomiting that is not controlled with medications, fever, or any other symptoms concerning to you.

## 2016-06-17 NOTE — ED Triage Notes (Signed)
Pt presents to ED reporting right flank pain beginning Sunday that was relieved with increased water intake. Pt reports pain on Sunday felt like increased "pressure." Pt reports pain has returned today and pt feels as though she can not empty bladder fully. No fevers, nausea, or vomiting. PT reports also having had diarrhea since Sunday.

## 2016-06-17 NOTE — ED Provider Notes (Addendum)
[facility] Emergency Department Provider Note  ____________________________________________  Time seen: Approximately 2:27 PM  I have reviewed the triage vital signs and the nursing notes.   HISTORY  Chief Complaint Flank Pain    HPI Felicia Manning is a 61 y.o. female with a history of significant nephrolithiasis requiring 3 separate surgeries last year for lithotripsy and basket removal bilaterally presenting for right flank pain. The patient reports that on Sunday she developed some urinary and suprapubic pressure associated with some mild right flank pain, so she drink plenty of fluid and her symptoms completely resolved. This morning, she developed much more severe flank pain without dysuria or hematuria. No nausea or vomiting, fever or chills. She tried drinking more order, but this did not alleviate her symptoms today.   Past Medical History:  Diagnosis Date  . Barrett's esophagus   . Dysrhythmia    stress related at work.  Marland Kitchen GERD (gastroesophageal reflux disease)   . Hepatitis C 2008  . Hyperlipidemia   . Hypertension   . Hypothyroidism   . Nephrolithiasis   . Osteoporosis   . Thyroid cancer ([place]) 2012  . Thyroid cancer [facility])     Patient Active Problem List   Diagnosis Date Noted  . 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 Fungal infection of toenail 06/11/2015  . 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 Barrett esophagus 05/02/2015  . Acid reflux 05/02/2015  . HCV (hepatitis C virus) 05/02/2015  . HLD (hyperlipidemia) 05/02/2015  . BP (high blood pressure) 05/02/2015  . Adult hypothyroidism 05/02/2015  . Elevated WBC count 05/02/2015  . 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[place]) 05/02/2015  . Kidney stone   . Pyelonephritis   . UTI (lower urinary tract infection) 04/08/2015  . Flank pain 04/08/2015  . Chronic hepatitis C virus infection ([place]) 03/12/2015  . Essential (primary) hypertension 03/12/2015  .  Gastro-esophageal reflux disease without esophagitis 03/12/2015  . Hypothyroidism, postop 03/12/2015  . H/O malignant neoplasm of thyroid 01/29/2014  . OP (osteoporosis) 10/06/2010  . Current tobacco use 10/06/2010  . Tobacco use 10/06/2010    Past Surgical History:  Procedure Laterality Date  . ABDOMINAL HYSTERECTOMY  2006  . COLONOSCOPY WITH PROPOFOL N/A 04/25/2015   Procedure: COLONOSCOPY WITH PROPOFOL;  Surgeon: Josefine Class, MD;  Location: [facility] ENDOSCOPY;  Service: Endoscopy;  Laterality: N/A;  . CYSTOSCOPY W/ RETROGRADES Bilateral 05/19/2015   Procedure: CYSTOSCOPY WITH RETROGRADE PYELOGRAM;  Surgeon: Hollice Espy, MD;  Location: [facility] ORS;  Service: Urology;  Laterality: Bilateral;  . CYSTOSCOPY W/ URETERAL STENT PLACEMENT Right 05/26/2015   Procedure: CYSTOSCOPY WITH STENT REPLACEMENT;  Surgeon: Hollice Espy, MD;  Location: [facility] ORS;  Service: Urology;  Laterality: Right;  . CYSTOSCOPY WITH STENT PLACEMENT Right 05/19/2015   Procedure: CYSTOSCOPY WITH STENT PLACEMENT;  Surgeon: Hollice Espy, MD;  Location: [facility] ORS;  Service: Urology;  Laterality: Right;  . CYSTOSCOPY WITH STENT PLACEMENT Right 05/26/2015   Procedure: CYSTOSCOPY WITH STENT PLACEMENT;  Surgeon: Hollice Espy, MD;  Location: [facility] ORS;  Service: Urology;  Laterality: Right;  . CYSTOSCOPY/URETEROSCOPY/HOLMIUM LASER/STENT PLACEMENT Left 07/23/2015   Procedure: CYSTOSCOPY/URETEROSCOPY/HOLMIUM LASER/STENT PLACEMENT/RETROGRADE PYELOGRAM;  Surgeon: Hollice Espy, MD;  Location: [facility] ORS;  Service: Urology;  Laterality: Left;  . ESOPHAGOGASTRODUODENOSCOPY (EGD) WITH PROPOFOL N/A 04/25/2015   Procedure: ESOPHAGOGASTRODUODENOSCOPY (EGD) WITH PROPOFOL;  Surgeon: Josefine Class, MD;  Location: [facility] ENDOSCOPY;  Service: Endoscopy;  Laterality: N/A;  . EYE SURGERY Bilateral 1964   lazy eye repair  . EYE SURGERY Bilateral 2001   lazy  eye repair  . LITHOTRIPSY  2009  . THYROIDECTOMY  2010  . URETEROSCOPY WITH HOLMIUM  LASER LITHOTRIPSY Right 05/19/2015   Procedure: URETEROSCOPY WITH HOLMIUM LASER LITHOTRIPSY;  Surgeon: Hollice Espy, MD;  Location: [facility] ORS;  Service: Urology;  Laterality: Right;  . URETEROSCOPY WITH HOLMIUM LASER LITHOTRIPSY Right 05/26/2015   Procedure: URETEROSCOPY WITH HOLMIUM LASER LITHOTRIPSY;  Surgeon: Hollice Espy, MD;  Location: [facility] ORS;  Service: Urology;  Laterality: Right;    Current Outpatient Rx  . Order #: [id number] Class: Historical Med  . Order #: [id number] Class: Historical Med  . Order #: [id number] Class: Historical Med  . Order #: [id number] Class: Historical Med  . Order #: [id number] Class: Print  . Order #: [id number] Class: Print  . Order #: [id number] Class: Print  . Order #: [id number] Class: Print    Allergies Patient has no known allergies.  Family History  Problem Relation Age of Onset  . CAD Mother   . Bladder Cancer Neg Hx   . Prostate cancer Neg Hx   . Kidney cancer Neg Hx     Social History Social History  Substance Use Topics  . Smoking status: Current Every Day Smoker    Packs/day: 1.00    Types: Cigarettes  . Smokeless tobacco: Never Used  . Alcohol use No    Review of Systems Constitutional: No fever/chills.No lightheadedness or syncope. Eyes: No visual changes. ENT: No sore throat. No congestion or rhinorrhea. Cardiovascular: Denies chest pain. Denies palpitations. Respiratory: Denies shortness of breath.  No cough. Gastrointestinal: Positive right flank pain. No abdominal pain.  No nausea, no vomiting.  No diarrhea.  No constipation. Genitourinary: Negative for dysuria. Positive suprapubic and urinary pressure. No hematuria. Musculoskeletal: Negative for back pain. Skin: Negative for rash. Neurological: Negative for headaches. No focal numbness, tingling or weakness.   10-point ROS otherwise negative.  ____________________________________________   PHYSICAL EXAM:  VITAL SIGNS: ED Triage Vitals  Enc Vitals Group     BP 06/17/16  1122 (!) 174/83     Pulse Rate 06/17/16 1122 73     Resp 06/17/16 1122 18     Temp 06/17/16 1122 97.5 F (36.4 C)     Temp Source 06/17/16 1122 Oral     SpO2 06/17/16 1122 95 %     Weight 06/17/16 1123 180 lb (81.6 kg)     Height 06/17/16 1123 '5\' 6"'$  (1.676 m)     Head Circumference --      Peak Flow --      Pain Score 06/17/16 1131 6     Pain Loc --      Pain Edu? --      Excl. in [place]? --     Constitutional: Alert and oriented. Well appearing and in no acute distress. Answers questions appropriately. Eyes: Conjunctivae are normal.  EOMI. No scleral icterus. Head: Atraumatic. Nose: No congestion/rhinnorhea. Mouth/Throat: Mucous membranes are moist.  Neck: No stridor.  Supple.   Cardiovascular: Normal rate, regular rhythm. No murmurs, rubs or gallops.  Respiratory: Normal respiratory effort.  No accessory muscle use or retractions. Lungs CTAB.  No wheezes, rales or ronchi. Gastrointestinal: No reproducible CVA tenderness bilaterally. Soft, nontender and nondistended.  No guarding or rebound.  No peritoneal signs. Musculoskeletal: No LE edema.  Neurologic:  A&Ox3.  Speech is clear.  Face and smile are symmetric.  EOMI.  Moves all extremities well. Skin:  Skin is warm, dry and intact. No rash noted. Psychiatric: Mood and affect are normal. Speech and behavior are normal.  Normal judgement.  ____________________________________________   LABS (all labs ordered are listed, but only abnormal results are displayed)  Labs Reviewed  URINALYSIS, COMPLETE (UACMP) WITH MICROSCOPIC - Abnormal; Notable for the following:       Result Value   Color, Urine STRAW (*)    APPearance CLEAR (*)    Specific Gravity, Urine 1.002 (*)    Hgb urine dipstick MODERATE (*)    Leukocytes, UA SMALL (*)    Bacteria, UA RARE (*)    Squamous Epithelial / LPF 0-5 (*)    All other components within normal limits  URINE CULTURE  BASIC METABOLIC PANEL  CBC    ____________________________________________  EKG  Not indicated ____________________________________________  RADIOLOGY  Ct Renal Stone Study  Result Date: 06/17/2016 CLINICAL DATA:  61 year old female with right flank pain for 4 days. Associated diarrhea and Dysuria. EXAM: CT ABDOMEN AND PELVIS WITHOUT CONTRAST TECHNIQUE: Multidetector CT imaging of the abdomen and pelvis was performed following the standard protocol without IV contrast. COMPARISON:  Noncontrast CT Abdomen and Pelvis 04/08/2015 FINDINGS: Lower chest: Regressed but not resolved bilateral lower lobe atelectasis or scarring since 2017. New confluent atelectasis in the lateral basal segment of the right lower lobe. No pericardial or pleural effusion. Hepatobiliary: Stable and negative; simple fluid density small hypodense re- demonstrated in the central right lobe. Pancreas: Negative. Spleen: Negative. Adrenals/Urinary Tract: Normal adrenal glands. Extensive left nephrolithiasis has not significantly changed since the prior CT. Small exophytic midpole cyst has simple fluid density. No left hydronephrosis and negative course of the left ureter. Unremarkable urinary bladder. Severe right hydronephrosis and hydroureter with perinephric and periureteral stranding to the level of a bulky 9 x 11 mm calculus (versus 2 adjacent calculi) located along the right pelvic sidewall about 5.5 cm from the right ureterovesical junction. See series 2, image 76 and coronal image 96. Numerous other right renal calculi, including additional bulky calculi in the right renal lower pole. Superimposed medial and lateral directed exophytic simple fluid density renal cysts. Stomach/Bowel: Negative rectum. Moderate diverticulosis of the sigmoid colon. No active inflammation. Mild to moderate diverticulosis of the left colon and splenic flexure. No active inflammation. Occasional transverse and right colon diverticula. No active inflammation. Normal appendix.  Negative terminal ileum. No dilated small bowel. Small gastric hiatal hernia is unchanged. Small 2 cm diverticulum of the second portion of the duodenum is unchanged. Otherwise negative stomach and duodenum. Vascular/Lymphatic: Aortoiliac calcified atherosclerosis. Vascular patency is not evaluated in the absence of IV contrast. Reproductive: Surgically absent. Other: No pelvic free fluid. Musculoskeletal: No acute osseous abnormality identified. IMPRESSION: 1. Severe acute obstructive uropathy on the right. Bulky 9 x 11 mm calculus (or adjacent calculi) in the distal right ureter about 5.5 cm proximal to the UVJ. 2. Extensive bilateral nephrolithiasis, including other bulky right lower pole renal calculi. 3. Right lower lobe atelectasis. 4. Calcified aortic atherosclerosis. 5. Colon diverticulosis. Electronically Signed   By: Genevie Ann M.D.   On: 06/17/2016 14:59    ____________________________________________   PROCEDURES  Procedure(s) performed: None  Procedures  Critical Care performed: No ____________________________________________   INITIAL IMPRESSION / ASSESSMENT AND PLAN / ED COURSE  Pertinent labs & imaging results that were available during my care of the patient were reviewed by me and considered in my medical decision making (see chart for details).  61 y.o. female with a history of renal colic presenting with intermittent right flank pain associated with suprapubic pressure. Overall, the patient has reassuring vital signs and is afebrile; she is not  having any systemic symptoms. I suspect that she may have another renal stone, although pyelonephritis is also possible. Aortic pathology is very unlikely, and an intra-abdominal pathology related to the intestines or other organs are also less likely. At this time, get a CT scan, and initiate symptomatic treatment. Plan reevaluation for final disposition.  ----------------------------------------- 3:54 PM on  06/17/2016 -----------------------------------------  At this time, the patient's pain has completely resolved and she is tolerating liquids. Her vitals remained stable. Her CT scan does show a significant renal stone or didn't, including a 9 x 11 mm stone in the right ureter with obstructive uropathy. Her creatinine is within normal limits. She has some rare bacteria in her urine, so culture has been sent and I will prophylactically start her on ciprofloxacin. I've spoken with the urologist on-call, who is aware of her elevated wbc, and recommends close outpatient follow-up for the patient's stone. She will be discharged home with symptomatic medications, she has Flomax at her home, and we have discussed return precautions at great length.    ____________________________________________  FINAL CLINICAL IMPRESSION(S) / ED DIAGNOSES  Final diagnoses:  Right flank pain  Renal colic  Obstructive uropathy         NEW MEDICATIONS STARTED DURING THIS VISIT:  New Prescriptions   CIPROFLOXACIN (CIPRO) 500 MG TABLET    Take 1 tablet (500 mg total) by mouth 2 (two) times daily.   KETOROLAC (TORADOL) 10 MG TABLET    Take 1 tablet (10 mg total) by mouth every 8 (eight) hours as needed for moderate pain (with food).   ONDANSETRON (ZOFRAN ODT) 4 MG DISINTEGRATING TABLET    Take 1 tablet (4 mg total) by mouth every 8 (eight) hours as needed for nausea or vomiting.   OXYCODONE-ACETAMINOPHEN (ROXICET) 5-325 MG TABLET    Take 1 tablet by mouth every 6 (six) hours as needed.      Eula Listen, MD 06/17/16 1556    Eula Listen, MD 06/17/16 1609    Eula Listen, MD 06/17/16 1610

## 2016-06-18 ENCOUNTER — Encounter: Payer: Self-pay | Admitting: Urology

## 2016-06-18 ENCOUNTER — Ambulatory Visit (INDEPENDENT_AMBULATORY_CARE_PROVIDER_SITE_OTHER): Payer: 59 | Admitting: Urology

## 2016-06-18 ENCOUNTER — Telehealth: Payer: Self-pay | Admitting: Urology

## 2016-06-18 VITALS — BP 126/78 | HR 82 | Ht 66.0 in | Wt 180.0 lb

## 2016-06-18 DIAGNOSIS — N201 Calculus of ureter: Secondary | ICD-10-CM

## 2016-06-18 DIAGNOSIS — N200 Calculus of kidney: Secondary | ICD-10-CM | POA: Diagnosis not present

## 2016-06-18 DIAGNOSIS — N281 Cyst of kidney, acquired: Secondary | ICD-10-CM | POA: Diagnosis not present

## 2016-06-18 NOTE — Progress Notes (Signed)
10:52 AM  06/18/16   Felicia Manning 1955-05-06 [id number]  Referring provider: Glendon Axe, MD [facility] [place], [place] [address]  Chief Complaint  Patient presents with  . Nephrolithiasis    HPI:  61 year old female With a history of recurrent nephrolithiasis who presented yesterday to the emergency room with an 11 mm right distal ureteral stone, obstructing. She did have a leukocytosis to 19 but otherwise showed no signs or symptoms of infection including no fevers, chills, dysuria, or gross hematuria.  UA was fairly unremarkable, urine culture pending. She was discharged with Cipro as a precaution.  Today, her pain is fairly well controlled. She is some dull aching in her right lower quadrant but nothing severe. She first experienced the pain last week.   Stone analysis consistent with 65% calcium oxalate monohydrate, 3% calcium oxalate dihydrate, and 32% calcium phosphate.  She does have a personal history of stones which have required surgical intervetnion on multiple occasions including ureteroscopy and ESWL (unsuccessful).   Most recently, she underwent staged ureteroscopic procedures bilaterally 2017.  She undergo 24 hour urine metabolic workup which revealed a fairly decent urinary volume of 2.2 L, no evidence of hypercalcemia, urachus. I'll, or any other obvious pathologic findings. She did have a good urinary citrate. See Epic for details.   PMH: Past Medical History:  Diagnosis Date  . Barrett's esophagus   . Dysrhythmia    stress related at work.  Marland Kitchen GERD (gastroesophageal reflux disease)   . Hepatitis C 2008  . Hyperlipidemia   . Hypertension   . Hypothyroidism   . Nephrolithiasis   . Osteoporosis   . Thyroid cancer ([place]) 2012  . Thyroid cancer [facility])     Surgical History: Past Surgical History:  Procedure Laterality Date  . ABDOMINAL HYSTERECTOMY  2006  . COLONOSCOPY WITH PROPOFOL N/A 04/25/2015   Procedure:  COLONOSCOPY WITH PROPOFOL;  Surgeon: Josefine Class, MD;  Location: [facility] ENDOSCOPY;  Service: Endoscopy;  Laterality: N/A;  . CYSTOSCOPY W/ RETROGRADES Bilateral 05/19/2015   Procedure: CYSTOSCOPY WITH RETROGRADE PYELOGRAM;  Surgeon: Hollice Espy, MD;  Location: [facility] ORS;  Service: Urology;  Laterality: Bilateral;  . CYSTOSCOPY W/ URETERAL STENT PLACEMENT Right 05/26/2015   Procedure: CYSTOSCOPY WITH STENT REPLACEMENT;  Surgeon: Hollice Espy, MD;  Location: [facility] ORS;  Service: Urology;  Laterality: Right;  . CYSTOSCOPY WITH STENT PLACEMENT Right 05/19/2015   Procedure: CYSTOSCOPY WITH STENT PLACEMENT;  Surgeon: Hollice Espy, MD;  Location: [facility] ORS;  Service: Urology;  Laterality: Right;  . CYSTOSCOPY WITH STENT PLACEMENT Right 05/26/2015   Procedure: CYSTOSCOPY WITH STENT PLACEMENT;  Surgeon: Hollice Espy, MD;  Location: [facility] ORS;  Service: Urology;  Laterality: Right;  . CYSTOSCOPY/URETEROSCOPY/HOLMIUM LASER/STENT PLACEMENT Left 07/23/2015   Procedure: CYSTOSCOPY/URETEROSCOPY/HOLMIUM LASER/STENT PLACEMENT/RETROGRADE PYELOGRAM;  Surgeon: Hollice Espy, MD;  Location: [facility] ORS;  Service: Urology;  Laterality: Left;  . ESOPHAGOGASTRODUODENOSCOPY (EGD) WITH PROPOFOL N/A 04/25/2015   Procedure: ESOPHAGOGASTRODUODENOSCOPY (EGD) WITH PROPOFOL;  Surgeon: Josefine Class, MD;  Location: [facility] ENDOSCOPY;  Service: Endoscopy;  Laterality: N/A;  . EYE SURGERY Bilateral 1964   lazy eye repair  . EYE SURGERY Bilateral 2001   lazy eye repair  . LITHOTRIPSY  2009  . THYROIDECTOMY  2010  . URETEROSCOPY WITH HOLMIUM LASER LITHOTRIPSY Right 05/19/2015   Procedure: URETEROSCOPY WITH HOLMIUM LASER LITHOTRIPSY;  Surgeon: Hollice Espy, MD;  Location: [facility] ORS;  Service: Urology;  Laterality: Right;  . URETEROSCOPY WITH HOLMIUM LASER LITHOTRIPSY Right 05/26/2015   Procedure: URETEROSCOPY  WITH HOLMIUM LASER LITHOTRIPSY;  Surgeon: Hollice Espy, MD;  Location: [facility] ORS;  Service: Urology;  Laterality: Right;      Home Medications:  Allergies as of 06/18/2016   No Known Allergies     Medication List       Accurate as of 06/18/16 10:52 AM. Always use your most recent med list.          ciprofloxacin 500 MG tablet Commonly known as:  CIPRO Take 1 tablet (500 mg total) by mouth 2 (two) times daily.   ketorolac 10 MG tablet Commonly known as:  TORADOL Take 1 tablet (10 mg total) by mouth every 8 (eight) hours as needed for moderate pain (with food).   levothyroxine 150 MCG tablet Commonly known as:  SYNTHROID, LEVOTHROID Take 150 mcg by mouth at bedtime. Take with a full glass of water at least 30 minutes before breakfast   losartan 100 MG tablet Commonly known as:  COZAAR 100 mg daily   metoprolol succinate 25 MG 24 hr tablet Commonly known as:  TOPROL-XL Take 25 mg by mouth every morning.   omeprazole 20 MG capsule Commonly known as:  PRILOSEC TAKE 1 CAPSULE (20 MG TOTAL) BY MOUTH ONCE DAILY-AM   ondansetron 4 MG disintegrating tablet Commonly known as:  ZOFRAN ODT Take 1 tablet (4 mg total) by mouth every 8 (eight) hours as needed for nausea or vomiting.   oxyCODONE-acetaminophen 5-325 MG tablet Commonly known as:  ROXICET Take 1 tablet by mouth every 6 (six) hours as needed.       Allergies: No Known Allergies  Family History: Family History  Problem Relation Age of Onset  . CAD Mother   . Bladder Cancer Neg Hx   . Prostate cancer Neg Hx   . Kidney cancer Neg Hx     Social History:  reports that she has been smoking Cigarettes.  She has been smoking about 1.00 pack per day. She has never used smokeless tobacco. She reports that she does not drink alcohol or use drugs.  ROS: UROLOGY Frequent Urination?: Yes Hard to postpone urination?: Yes Burning/pain with urination?: No Get up at night to urinate?: Yes Leakage of urine?: Yes Urine stream starts and stops?: No Trouble starting stream?: No Do you have to strain to urinate?: No Blood in urine?:  Yes Urinary tract infection?: No Sexually transmitted disease?: No Injury to kidneys or bladder?: No Painful intercourse?: No Weak stream?: No Currently pregnant?: No Vaginal bleeding?: No Last menstrual period?: n  Gastrointestinal Nausea?: No Vomiting?: No Indigestion/heartburn?: No Diarrhea?: No Constipation?: No  Constitutional Fever: No Night sweats?: No Weight loss?: No Fatigue?: No  Skin Skin rash/lesions?: No Itching?: No  Eyes Blurred vision?: No Double vision?: No  Ears/Nose/Throat Sore throat?: No Sinus problems?: No  Hematologic/Lymphatic Swollen glands?: No Easy bruising?: No  Cardiovascular Chest pain?: No  Respiratory Cough?: No Shortness of breath?: No  Endocrine Excessive thirst?: No  Musculoskeletal Back pain?: No Joint pain?: No  Neurological Headaches?: No Dizziness?: No  Psychologic Depression?: No Anxiety?: No  Physical Exam: BP 126/78   Pulse 82   Ht '5\' 6"'$  (1.676 m)   Wt 180 lb (81.6 kg)   BMI 29.05 kg/m   Constitutional:  Alert and oriented, No acute distress. HEENT: [place] AT, moist mucus membranes.  Trachea midline, no masses. Cardiovascular: No clubbing, cyanosis, or edema.  Respiratory: Normal respiratory effort, no increased work of breathing.  GI: Abdomen is soft, nontender, nondistended, no abdominal masses GU: No CVA tenderness.  Skin: No rashes, bruises or suspicious lesions. Neurologic: Grossly intact, no focal deficits, moving all 4 extremities. Psychiatric: Normal mood and affect.  Laboratory Data: Lab Results  Component Value Date   WBC 19.0 (H) 06/17/2016   HGB 14.4 06/17/2016   HCT 43.5 06/17/2016   MCV 90.6 06/17/2016   PLT 171 06/17/2016    Lab Results  Component Value Date   CREATININE 0.74 06/17/2016   Pertinent Imaging: CLINICAL DATA:  61 year old female with right flank pain for 4 days. Associated diarrhea and Dysuria.  EXAM: CT ABDOMEN AND PELVIS WITHOUT  CONTRAST  TECHNIQUE: Multidetector CT imaging of the abdomen and pelvis was performed following the standard protocol without IV contrast.  COMPARISON:  Noncontrast CT Abdomen and Pelvis 04/08/2015  FINDINGS: Lower chest: Regressed but not resolved bilateral lower lobe atelectasis or scarring since 2017. New confluent atelectasis in the lateral basal segment of the right lower lobe. No pericardial or pleural effusion.  Hepatobiliary: Stable and negative; simple fluid density small hypodense re- demonstrated in the central right lobe.  Pancreas: Negative.  Spleen: Negative.  Adrenals/Urinary Tract: Normal adrenal glands.  Extensive left nephrolithiasis has not significantly changed since the prior CT. Small exophytic midpole cyst has simple fluid density. No left hydronephrosis and negative course of the left ureter.  Unremarkable urinary bladder.  Severe right hydronephrosis and hydroureter with perinephric and periureteral stranding to the level of a bulky 9 x 11 mm calculus (versus 2 adjacent calculi) located along the right pelvic sidewall about 5.5 cm from the right ureterovesical junction. See series 2, image 76 and coronal image 96. Numerous other right renal calculi, including additional bulky calculi in the right renal lower pole. Superimposed medial and lateral directed exophytic simple fluid density renal cysts.  Stomach/Bowel: Negative rectum. Moderate diverticulosis of the sigmoid colon. No active inflammation. Mild to moderate diverticulosis of the left colon and splenic flexure. No active inflammation. Occasional transverse and right colon diverticula. No active inflammation. Normal appendix. Negative terminal ileum. No dilated small bowel. Small gastric hiatal hernia is unchanged. Small 2 cm diverticulum of the second portion of the duodenum is unchanged. Otherwise negative stomach and duodenum.  Vascular/Lymphatic: Aortoiliac calcified  atherosclerosis. Vascular patency is not evaluated in the absence of IV contrast.  Reproductive: Surgically absent.  Other: No pelvic free fluid.  Musculoskeletal: No acute osseous abnormality identified.  IMPRESSION: 1. Severe acute obstructive uropathy on the right. Bulky 9 x 11 mm calculus (or adjacent calculi) in the distal right ureter about 5.5 cm proximal to the UVJ. 2. Extensive bilateral nephrolithiasis, including other bulky right lower pole renal calculi. 3. Right lower lobe atelectasis. 4. Calcified aortic atherosclerosis. 5. Colon diverticulosis.   Electronically Signed   By: Genevie Ann M.D.   On: 06/17/2016 14:59  CT scan images personally reviewed today, compared to previous CT renal stone in 03/2015.  Assessment & Plan:    1. Nephrolithiasis Interval progression of stone burden consistent with metabolically active stone disease 24 urine metabolic workup results reviewed No clear benefit to thiazide or potassium citrate based on parameters Reviewed generic stone prevention recommendations, advised to increase fluids to 3 L daiyl  2. Right renal cyst Incidental complex Bosniak 2 right renal cyst on renal ultrasound on 06/16/15 ---> no appreciated on most recent ultrasound  3. Right obstructing distal ureteral stone Given the size and location of the stone was unlikely to pass spontaneously Very specific warning symptoms are reviewed and indications for urgent or emergent intervention discussed Given previous failed  shockwave lithotripsies, would recommend ureteroscopic intervention. We'll also attempt to treat upper tract stones as time/ anatomy allows.  Risks and benefits of ureteroscopy were reviewed including but not limited to infection, bleeding, pain, ureteral injury which could require open surgery versus prolonged indwelling if ureteralperforation occurs, persistent stone disease, requirement for staged procedure, possible stent, and global anesthesia  risks. Patient expressed understanding and desires to proceed with ureteroscopy.  Schedule surgery as above   Hollice Espy, MD  [facility] [address], [place], [place] [address] [phone]  I spent 25 min with this patient of which greater than 50% was spent in counseling and coordination of care with the patient.

## 2016-06-18 NOTE — Telephone Encounter (Signed)
-----   Message from Hollice Espy, MD sent at 06/18/2016  8:29 AM EDT ----- Regarding: URgent follow up I got a message that this patient was in the emergency room overnight. She needs to be seen ASAP.  Can you see if she wants to come in today at 11:15 to see me?  Hollice Espy, MD

## 2016-06-18 NOTE — Telephone Encounter (Signed)
done

## 2016-06-19 LAB — URINE CULTURE: Culture: <10000 — AB

## 2016-06-21 ENCOUNTER — Other Ambulatory Visit: Payer: Self-pay | Admitting: Radiology

## 2016-06-21 ENCOUNTER — Telehealth: Payer: Self-pay | Admitting: Radiology

## 2016-06-21 DIAGNOSIS — N201 Calculus of ureter: Secondary | ICD-10-CM

## 2016-06-21 NOTE — Telephone Encounter (Signed)
Pt called stating she had severe pain over the weekend & believes she has passed a stone. Pt states she does not feel it was the large stone that was seen on imaging. Advised pt that Dr Erlene Quan recommends proceeding with surgery as pt has multiple stones in the kidney. Questions answered. Pt voices understanding. Also notified pt of surgery scheduled with Dr Pilar Jarvis on 06/25/16, pre-admit testing appt on 06/23/16 '@11'$ :15 & to call day prior to surgery for arrival time to [facility].

## 2016-06-23 ENCOUNTER — Ambulatory Visit: Payer: Managed Care, Other (non HMO) | Admitting: Urology

## 2016-06-23 ENCOUNTER — Encounter
Admission: RE | Admit: 2016-06-23 | Discharge: 2016-06-23 | Disposition: A | Discharge status: Home / Self Care | Payer: 59 | Source: Ambulatory Visit | Attending: Urology | Admitting: Urology

## 2016-06-23 DIAGNOSIS — B1920 Unspecified viral hepatitis C without hepatic coma: Secondary | ICD-10-CM | POA: Diagnosis not present

## 2016-06-23 DIAGNOSIS — N200 Calculus of kidney: Secondary | ICD-10-CM | POA: Diagnosis not present

## 2016-06-23 DIAGNOSIS — E785 Hyperlipidemia, unspecified: Secondary | ICD-10-CM | POA: Diagnosis not present

## 2016-06-23 DIAGNOSIS — Z01810 Encounter for preprocedural cardiovascular examination: Secondary | ICD-10-CM | POA: Diagnosis not present

## 2016-06-23 DIAGNOSIS — K219 Gastro-esophageal reflux disease without esophagitis: Secondary | ICD-10-CM | POA: Diagnosis not present

## 2016-06-23 DIAGNOSIS — Z85850 Personal history of malignant neoplasm of thyroid: Secondary | ICD-10-CM | POA: Diagnosis not present

## 2016-06-23 DIAGNOSIS — E890 Postprocedural hypothyroidism: Secondary | ICD-10-CM | POA: Diagnosis not present

## 2016-06-23 DIAGNOSIS — Z79899 Other long term (current) drug therapy: Secondary | ICD-10-CM | POA: Diagnosis not present

## 2016-06-23 DIAGNOSIS — E039 Hypothyroidism, unspecified: Secondary | ICD-10-CM | POA: Diagnosis not present

## 2016-06-23 DIAGNOSIS — F17210 Nicotine dependence, cigarettes, uncomplicated: Secondary | ICD-10-CM | POA: Diagnosis not present

## 2016-06-23 DIAGNOSIS — I10 Essential (primary) hypertension: Secondary | ICD-10-CM | POA: Diagnosis not present

## 2016-06-23 DIAGNOSIS — Z90710 Acquired absence of both cervix and uterus: Secondary | ICD-10-CM | POA: Diagnosis not present

## 2016-06-23 NOTE — Patient Instructions (Signed)
Your procedure is scheduled on: 06/25/16 Fri Report to Same Day Surgery 2nd floor medical mall [facility] Entrance-take elevator on left to 2nd floor.  Check in with surgery information desk.) To find out your arrival time please call [phone] between 1PM - 3PM on 06/24/16 Thurs Remember: Instructions that are not followed completely may result in serious medical risk, up to and including death, or upon the discretion of your surgeon and anesthesiologist your surgery may need to be rescheduled.    _x___ 1. Do not eat food or drink liquids after midnight. No gum chewing or                              hard candies.     __x__ 2. No Alcohol for 24 hours before or after surgery.   __x__3. No Smoking for 24 prior to surgery.   ____  4. Bring all medications with you on the day of surgery if instructed.    __x__ 5. Notify your doctor if there is any change in your medical condition     (cold, fever, infections).     Do not wear jewelry, make-up, hairpins, clips or nail polish.  Do not wear lotions, powders, or perfumes. You may wear deodorant.  Do not shave 48 hours prior to surgery. Men may shave face and neck.  Do not bring valuables to the hospital.    [facility] is not responsible for any belongings or valuables.               Contacts, dentures or bridgework may not be worn into surgery.  Leave your suitcase in the car. After surgery it may be brought to your room.  For patients admitted to the hospital, discharge time is determined by your                       treatment team.   Patients discharged the day of surgery will not be allowed to drive home.  You will need someone to drive you home and stay with you the night of your procedure.    Please read over the following fact sheets that you were given:   [facility] Preparing for Surgery and or MRSA Information   _x___ Take anti-hypertensive (unless it includes a diuretic), cardiac, seizure, asthma,     anti-reflux and  psychiatric medicines. These include:  1. losartan (COZAAR  2.metoprolol succinate (TOPROL-XL  3.omeprazole (PRILOSEC)   4.oxyCODONE-acetaminophen  If needed  5.  6.  ____Fleets enema or Magnesium Citrate as directed.   ____ Use CHG Soap or sage wipes as directed on instruction sheet   ____ Use inhalers on the day of surgery and bring to hospital day of surgery  ____ Stop Metformin and Janumet 2 days prior to surgery.    ____ Take 1/2 of usual insulin dose the night before surgery and none on the morning     surgery.   _x___ Follow recommendations from Cardiologist, Pulmonologist or PCP regarding          stopping Aspirin, Coumadin, Pllavix ,Eliquis, Effient, or Pradaxa, and Pletal.  X____Stop Anti-inflammatories such as Advil, Aleve, Ibuprofen, Motrin, Naproxen,  Naprosyn, Goodies powders or aspirin products.   Stop Toradol 1 week before surgery.  OK to take Tylenol and  Celebrex.   _x___ Stop supplements until after surgery.  But may continue Vitamin D, Vitamin B,       and multivitamin.   ____ Bring C-Pap to the hospital.

## 2016-06-24 MED ORDER — CEFAZOLIN SODIUM-DEXTROSE 2-4 GM/100ML-% IV SOLN
2.0000 g | INTRAVENOUS | Status: AC
  Administered 2016-06-25: 2 g via INTRAVENOUS

## 2016-06-25 ENCOUNTER — Ambulatory Visit: Payer: 59 | Admitting: Registered Nurse

## 2016-06-25 ENCOUNTER — Telehealth: Payer: Self-pay | Admitting: Urology

## 2016-06-25 ENCOUNTER — Encounter
Admission: RE | Disposition: A | Discharge status: Home / Self Care | Payer: Self-pay | Source: Ambulatory Visit | Attending: Urology

## 2016-06-25 ENCOUNTER — Ambulatory Visit
Admission: RE | Admit: 2016-06-25 | Discharge: 2016-06-25 | Disposition: A | Discharge status: Home / Self Care | Payer: 59 | Source: Ambulatory Visit | Attending: Urology | Admitting: Urology

## 2016-06-25 ENCOUNTER — Other Ambulatory Visit: Payer: Self-pay | Admitting: Urology

## 2016-06-25 ENCOUNTER — Encounter: Payer: Self-pay | Admitting: *Deleted

## 2016-06-25 DIAGNOSIS — E039 Hypothyroidism, unspecified: Secondary | ICD-10-CM | POA: Insufficient documentation

## 2016-06-25 DIAGNOSIS — Z01810 Encounter for preprocedural cardiovascular examination: Secondary | ICD-10-CM | POA: Insufficient documentation

## 2016-06-25 DIAGNOSIS — K219 Gastro-esophageal reflux disease without esophagitis: Secondary | ICD-10-CM | POA: Insufficient documentation

## 2016-06-25 DIAGNOSIS — Z90710 Acquired absence of both cervix and uterus: Secondary | ICD-10-CM | POA: Insufficient documentation

## 2016-06-25 DIAGNOSIS — N200 Calculus of kidney: Secondary | ICD-10-CM | POA: Diagnosis not present

## 2016-06-25 DIAGNOSIS — E890 Postprocedural hypothyroidism: Secondary | ICD-10-CM | POA: Insufficient documentation

## 2016-06-25 DIAGNOSIS — Z79899 Other long term (current) drug therapy: Secondary | ICD-10-CM | POA: Insufficient documentation

## 2016-06-25 DIAGNOSIS — E785 Hyperlipidemia, unspecified: Secondary | ICD-10-CM | POA: Insufficient documentation

## 2016-06-25 DIAGNOSIS — B1920 Unspecified viral hepatitis C without hepatic coma: Secondary | ICD-10-CM | POA: Insufficient documentation

## 2016-06-25 DIAGNOSIS — Z85850 Personal history of malignant neoplasm of thyroid: Secondary | ICD-10-CM | POA: Insufficient documentation

## 2016-06-25 DIAGNOSIS — N201 Calculus of ureter: Secondary | ICD-10-CM

## 2016-06-25 DIAGNOSIS — I10 Essential (primary) hypertension: Secondary | ICD-10-CM | POA: Insufficient documentation

## 2016-06-25 DIAGNOSIS — F17210 Nicotine dependence, cigarettes, uncomplicated: Secondary | ICD-10-CM | POA: Insufficient documentation

## 2016-06-25 HISTORY — PX: URETEROSCOPY WITH HOLMIUM LASER LITHOTRIPSY: SHX6645

## 2016-06-25 HISTORY — PX: CYSTOSCOPY WITH STENT PLACEMENT: SHX5790

## 2016-06-25 SURGERY — URETEROSCOPY, WITH LITHOTRIPSY USING HOLMIUM LASER
Anesthesia: General | Laterality: Right

## 2016-06-25 MED ORDER — PROPOFOL 10 MG/ML IV BOLUS
INTRAVENOUS | Status: AC
  Filled 2016-06-25: qty 20

## 2016-06-25 MED ORDER — FENTANYL CITRATE (PF) 100 MCG/2ML IJ SOLN
INTRAMUSCULAR | Status: AC
  Filled 2016-06-25: qty 2

## 2016-06-25 MED ORDER — EPHEDRINE SULFATE 50 MG/ML IJ SOLN
INTRAMUSCULAR | Status: DC | PRN
  Administered 2016-06-25: 5 mg via INTRAVENOUS

## 2016-06-25 MED ORDER — SUCCINYLCHOLINE CHLORIDE 20 MG/ML IJ SOLN
INTRAMUSCULAR | Status: DC | PRN
  Administered 2016-06-25: 100 mg via INTRAVENOUS

## 2016-06-25 MED ORDER — EPHEDRINE SULFATE 50 MG/ML IJ SOLN
INTRAMUSCULAR | Status: AC
  Filled 2016-06-25: qty 1

## 2016-06-25 MED ORDER — ONDANSETRON HCL 4 MG/2ML IJ SOLN
INTRAMUSCULAR | Status: DC | PRN
  Administered 2016-06-25: 4 mg via INTRAVENOUS

## 2016-06-25 MED ORDER — PROPOFOL 10 MG/ML IV BOLUS
INTRAVENOUS | Status: DC | PRN
  Administered 2016-06-25: 180 mg via INTRAVENOUS

## 2016-06-25 MED ORDER — ROCURONIUM BROMIDE 100 MG/10ML IV SOLN
INTRAVENOUS | Status: DC | PRN
  Administered 2016-06-25: 5 mg via INTRAVENOUS
  Administered 2016-06-25: 20 mg via INTRAVENOUS
  Administered 2016-06-25: 25 mg via INTRAVENOUS

## 2016-06-25 MED ORDER — ALBUTEROL SULFATE HFA 108 (90 BASE) MCG/ACT IN AERS
INHALATION_SPRAY | RESPIRATORY_TRACT | Status: AC
  Filled 2016-06-25: qty 6.7

## 2016-06-25 MED ORDER — LIDOCAINE HCL (PF) 2 % IJ SOLN
INTRAMUSCULAR | Status: AC
  Filled 2016-06-25: qty 2

## 2016-06-25 MED ORDER — SUGAMMADEX SODIUM 200 MG/2ML IV SOLN
INTRAVENOUS | Status: AC
  Filled 2016-06-25: qty 2

## 2016-06-25 MED ORDER — MIDAZOLAM HCL 2 MG/2ML IJ SOLN
INTRAMUSCULAR | Status: AC
  Filled 2016-06-25: qty 2

## 2016-06-25 MED ORDER — OXYCODONE-ACETAMINOPHEN 5-325 MG PO TABS: 1.0000 | ORAL_TABLET | ORAL | 0 refills | Status: DC | PRN

## 2016-06-25 MED ORDER — MIDAZOLAM HCL 2 MG/2ML IJ SOLN
INTRAMUSCULAR | Status: DC | PRN
  Administered 2016-06-25: 2 mg via INTRAVENOUS

## 2016-06-25 MED ORDER — DEXAMETHASONE SODIUM PHOSPHATE 10 MG/ML IJ SOLN
INTRAMUSCULAR | Status: AC
  Filled 2016-06-25: qty 1

## 2016-06-25 MED ORDER — ALBUTEROL SULFATE HFA 108 (90 BASE) MCG/ACT IN AERS
INHALATION_SPRAY | RESPIRATORY_TRACT | Status: DC | PRN
  Administered 2016-06-25: 2 via RESPIRATORY_TRACT

## 2016-06-25 MED ORDER — CEPHALEXIN 500 MG PO CAPS: 500.0000 mg | ORAL_CAPSULE | Freq: Three times a day (TID) | ORAL | 0 refills | Status: DC

## 2016-06-25 MED ORDER — ONDANSETRON HCL 4 MG/2ML IJ SOLN: 4.0000 mg | Freq: Once | INTRAMUSCULAR | Status: DC | PRN

## 2016-06-25 MED ORDER — CEFAZOLIN SODIUM-DEXTROSE 2-4 GM/100ML-% IV SOLN
INTRAVENOUS | Status: AC
  Filled 2016-06-25: qty 100

## 2016-06-25 MED ORDER — SUGAMMADEX SODIUM 200 MG/2ML IV SOLN
INTRAVENOUS | Status: DC | PRN
  Administered 2016-06-25: 320 mg via INTRAVENOUS

## 2016-06-25 MED ORDER — FENTANYL CITRATE (PF) 100 MCG/2ML IJ SOLN: 25.0000 ug | INTRAMUSCULAR | Status: DC | PRN

## 2016-06-25 MED ORDER — FENTANYL CITRATE (PF) 100 MCG/2ML IJ SOLN
INTRAMUSCULAR | Status: DC | PRN
  Administered 2016-06-25: 50 ug via INTRAVENOUS
  Administered 2016-06-25: 100 ug via INTRAVENOUS

## 2016-06-25 MED ORDER — ROCURONIUM BROMIDE 50 MG/5ML IV SOLN
INTRAVENOUS | Status: AC
  Filled 2016-06-25: qty 1

## 2016-06-25 MED ORDER — ONDANSETRON HCL 4 MG/2ML IJ SOLN
INTRAMUSCULAR | Status: AC
  Filled 2016-06-25: qty 2

## 2016-06-25 MED ORDER — LACTATED RINGERS IV SOLN
INTRAVENOUS | Status: DC
  Administered 2016-06-25: 11:00:00 via INTRAVENOUS

## 2016-06-25 MED ORDER — LIDOCAINE HCL (CARDIAC) 20 MG/ML IV SOLN
INTRAVENOUS | Status: DC | PRN
  Administered 2016-06-25: 80 mg via INTRAVENOUS

## 2016-06-25 MED ORDER — DEXAMETHASONE SODIUM PHOSPHATE 10 MG/ML IJ SOLN
INTRAMUSCULAR | Status: DC | PRN
  Administered 2016-06-25: 5 mg via INTRAVENOUS

## 2016-06-25 SURGICAL SUPPLY — 30 items
BACTOSHIELD CHG 4% 4OZ (MISCELLANEOUS) ×1
BASKET ZERO TIP 1.9FR (BASKET) IMPLANT
CATH URETL 5X70 OPEN END (CATHETERS) ×2 IMPLANT
CNTNR SPEC 2.5X3XGRAD LEK (MISCELLANEOUS) ×1
CONT SPEC 4OZ STER OR WHT (MISCELLANEOUS) ×1
CONTAINER SPEC 2.5X3XGRAD LEK (MISCELLANEOUS) ×1 IMPLANT
FEE TECHNICIAN ONLY PER HOUR (MISCELLANEOUS) IMPLANT
FIBER LASER LITHO 273 (Laser) ×2 IMPLANT
GLOVE BIO SURGEON STRL SZ7 (GLOVE) ×2 IMPLANT
GLOVE BIO SURGEON STRL SZ7.5 (GLOVE) ×2 IMPLANT
GOWN STRL REUS W/ TWL LRG LVL4 (GOWN DISPOSABLE) ×1 IMPLANT
GOWN STRL REUS W/TWL LRG LVL4 (GOWN DISPOSABLE) ×1
GOWN STRL REUS W/TWL XL LVL3 (GOWN DISPOSABLE) ×2 IMPLANT
GUIDEWIRE SUPER STIFF (WIRE) IMPLANT
INTRODUCER DILATOR DOUBLE (INTRODUCER) ×2 IMPLANT
KIT RM TURNOVER CYSTO AR (KITS) ×2 IMPLANT
PACK CYSTO AR (MISCELLANEOUS) ×2 IMPLANT
SCRUB CHG 4% DYNA-HEX 4OZ (MISCELLANEOUS) ×1 IMPLANT
SENSORWIRE 0.038 NOT ANGLED (WIRE) ×2
SET CYSTO W/LG BORE CLAMP LF (SET/KITS/TRAYS/PACK) ×2 IMPLANT
SHEATH URETERAL 12FRX35CM (MISCELLANEOUS) ×2 IMPLANT
SHEATH URETERAL 13/15X36 1L (SHEATH) IMPLANT
SOL .9 NS 3000ML IRR  AL (IV SOLUTION) ×1
SOL .9 NS 3000ML IRR UROMATIC (IV SOLUTION) ×1 IMPLANT
STENT URET 6FRX24 CONTOUR (STENTS) IMPLANT
STENT URET 6FRX26 CONTOUR (STENTS) ×2 IMPLANT
SURGILUBE 2OZ TUBE FLIPTOP (MISCELLANEOUS) ×2 IMPLANT
SYRINGE IRR TOOMEY STRL 70CC (SYRINGE) ×2 IMPLANT
WATER STERILE IRR 1000ML POUR (IV SOLUTION) ×2 IMPLANT
WIRE SENSOR 0.038 NOT ANGLED (WIRE) ×1 IMPLANT

## 2016-06-25 NOTE — OR Nursing (Signed)
abx will be started in OR per Threasa Heads, RN

## 2016-06-25 NOTE — Anesthesia Preprocedure Evaluation (Signed)
Anesthesia Evaluation  Patient identified by MRN, date of birth, ID band Patient awake    Reviewed: Allergy & Precautions, H&P , NPO status , Patient's Chart, lab work & pertinent test results, reviewed documented beta blocker date and time   Airway Mallampati: II  TM Distance: >3 FB Neck ROM: full    Dental  (+) Teeth Intact   Pulmonary neg pulmonary ROS, Current Smoker,    Pulmonary exam normal        Cardiovascular Exercise Tolerance: Good hypertension, negative cardio ROS Normal cardiovascular exam+ dysrhythmias  Rate:Normal     Neuro/Psych negative neurological ROS  negative psych ROS   GI/Hepatic negative GI ROS, Neg liver ROS, GERD  Medicated,(+) Hepatitis -  Endo/Other  negative endocrine ROSHypothyroidism   Renal/GU Renal diseasenegative Renal ROS  negative genitourinary   Musculoskeletal   Abdominal   Peds  Hematology negative hematology ROS (+)   Anesthesia Other Findings   Reproductive/Obstetrics negative OB ROS                             Anesthesia Physical Anesthesia Plan  ASA: III  Anesthesia Plan: General LMA   Post-op Pain Management:    Induction:   Airway Management Planned:   Additional Equipment:   Intra-op Plan:   Post-operative Plan:   Informed Consent: I have reviewed the patients History and Physical, chart, labs and discussed the procedure including the risks, benefits and alternatives for the proposed anesthesia with the patient or authorized representative who has indicated his/her understanding and acceptance.     Plan Discussed with: CRNA  Anesthesia Plan Comments:         Anesthesia Quick Evaluation

## 2016-06-25 NOTE — H&P (View-Only) (Signed)
10:52 AM  06/18/16   Felicia Manning 1955-12-14 [id number]  Referring provider: Glendon Axe, MD [facility] [place], [place] [address]  Chief Complaint  Patient presents with  . Nephrolithiasis    HPI:  61 year old female With a history of recurrent nephrolithiasis who presented yesterday to the emergency room with an 11 mm right distal ureteral stone, obstructing. She did have a leukocytosis to 19 but otherwise showed no signs or symptoms of infection including no fevers, chills, dysuria, or gross hematuria.  UA was fairly unremarkable, urine culture pending. She was discharged with Cipro as a precaution.  Today, her pain is fairly well controlled. She is some dull aching in her right lower quadrant but nothing severe. She first experienced the pain last week.   Stone analysis consistent with 65% calcium oxalate monohydrate, 3% calcium oxalate dihydrate, and 32% calcium phosphate.  She does have a personal history of stones which have required surgical intervetnion on multiple occasions including ureteroscopy and ESWL (unsuccessful).   Most recently, she underwent staged ureteroscopic procedures bilaterally 2017.  She undergo 24 hour urine metabolic workup which revealed a fairly decent urinary volume of 2.2 L, no evidence of hypercalcemia, urachus. I'll, or any other obvious pathologic findings. She did have a good urinary citrate. See Epic for details.   PMH: Past Medical History:  Diagnosis Date  . Barrett's esophagus   . Dysrhythmia    stress related at work.  Marland Kitchen GERD (gastroesophageal reflux disease)   . Hepatitis C 2008  . Hyperlipidemia   . Hypertension   . Hypothyroidism   . Nephrolithiasis   . Osteoporosis   . Thyroid cancer ([place]) 2012  . Thyroid cancer [facility])     Surgical History: Past Surgical History:  Procedure Laterality Date  . ABDOMINAL HYSTERECTOMY  2006  . COLONOSCOPY WITH PROPOFOL N/A 04/25/2015   Procedure:  COLONOSCOPY WITH PROPOFOL;  Surgeon: Josefine Class, MD;  Location: [facility] ENDOSCOPY;  Service: Endoscopy;  Laterality: N/A;  . CYSTOSCOPY W/ RETROGRADES Bilateral 05/19/2015   Procedure: CYSTOSCOPY WITH RETROGRADE PYELOGRAM;  Surgeon: Hollice Espy, MD;  Location: [facility] ORS;  Service: Urology;  Laterality: Bilateral;  . CYSTOSCOPY W/ URETERAL STENT PLACEMENT Right 05/26/2015   Procedure: CYSTOSCOPY WITH STENT REPLACEMENT;  Surgeon: Hollice Espy, MD;  Location: [facility] ORS;  Service: Urology;  Laterality: Right;  . CYSTOSCOPY WITH STENT PLACEMENT Right 05/19/2015   Procedure: CYSTOSCOPY WITH STENT PLACEMENT;  Surgeon: Hollice Espy, MD;  Location: [facility] ORS;  Service: Urology;  Laterality: Right;  . CYSTOSCOPY WITH STENT PLACEMENT Right 05/26/2015   Procedure: CYSTOSCOPY WITH STENT PLACEMENT;  Surgeon: Hollice Espy, MD;  Location: [facility] ORS;  Service: Urology;  Laterality: Right;  . CYSTOSCOPY/URETEROSCOPY/HOLMIUM LASER/STENT PLACEMENT Left 07/23/2015   Procedure: CYSTOSCOPY/URETEROSCOPY/HOLMIUM LASER/STENT PLACEMENT/RETROGRADE PYELOGRAM;  Surgeon: Hollice Espy, MD;  Location: [facility] ORS;  Service: Urology;  Laterality: Left;  . ESOPHAGOGASTRODUODENOSCOPY (EGD) WITH PROPOFOL N/A 04/25/2015   Procedure: ESOPHAGOGASTRODUODENOSCOPY (EGD) WITH PROPOFOL;  Surgeon: Josefine Class, MD;  Location: [facility] ENDOSCOPY;  Service: Endoscopy;  Laterality: N/A;  . EYE SURGERY Bilateral 1964   lazy eye repair  . EYE SURGERY Bilateral 2001   lazy eye repair  . LITHOTRIPSY  2009  . THYROIDECTOMY  2010  . URETEROSCOPY WITH HOLMIUM LASER LITHOTRIPSY Right 05/19/2015   Procedure: URETEROSCOPY WITH HOLMIUM LASER LITHOTRIPSY;  Surgeon: Hollice Espy, MD;  Location: [facility] ORS;  Service: Urology;  Laterality: Right;  . URETEROSCOPY WITH HOLMIUM LASER LITHOTRIPSY Right 05/26/2015   Procedure: URETEROSCOPY  WITH HOLMIUM LASER LITHOTRIPSY;  Surgeon: Hollice Espy, MD;  Location: [facility] ORS;  Service: Urology;  Laterality: Right;      Home Medications:  Allergies as of 06/18/2016   No Known Allergies     Medication List       Accurate as of 06/18/16 10:52 AM. Always use your most recent med list.          ciprofloxacin 500 MG tablet Commonly known as:  CIPRO Take 1 tablet (500 mg total) by mouth 2 (two) times daily.   ketorolac 10 MG tablet Commonly known as:  TORADOL Take 1 tablet (10 mg total) by mouth every 8 (eight) hours as needed for moderate pain (with food).   levothyroxine 150 MCG tablet Commonly known as:  SYNTHROID, LEVOTHROID Take 150 mcg by mouth at bedtime. Take with a full glass of water at least 30 minutes before breakfast   losartan 100 MG tablet Commonly known as:  COZAAR 100 mg daily   metoprolol succinate 25 MG 24 hr tablet Commonly known as:  TOPROL-XL Take 25 mg by mouth every morning.   omeprazole 20 MG capsule Commonly known as:  PRILOSEC TAKE 1 CAPSULE (20 MG TOTAL) BY MOUTH ONCE DAILY-AM   ondansetron 4 MG disintegrating tablet Commonly known as:  ZOFRAN ODT Take 1 tablet (4 mg total) by mouth every 8 (eight) hours as needed for nausea or vomiting.   oxyCODONE-acetaminophen 5-325 MG tablet Commonly known as:  ROXICET Take 1 tablet by mouth every 6 (six) hours as needed.       Allergies: No Known Allergies  Family History: Family History  Problem Relation Age of Onset  . CAD Mother   . Bladder Cancer Neg Hx   . Prostate cancer Neg Hx   . Kidney cancer Neg Hx     Social History:  reports that she has been smoking Cigarettes.  She has been smoking about 1.00 pack per day. She has never used smokeless tobacco. She reports that she does not drink alcohol or use drugs.  ROS: UROLOGY Frequent Urination?: Yes Hard to postpone urination?: Yes Burning/pain with urination?: No Get up at night to urinate?: Yes Leakage of urine?: Yes Urine stream starts and stops?: No Trouble starting stream?: No Do you have to strain to urinate?: No Blood in urine?:  Yes Urinary tract infection?: No Sexually transmitted disease?: No Injury to kidneys or bladder?: No Painful intercourse?: No Weak stream?: No Currently pregnant?: No Vaginal bleeding?: No Last menstrual period?: n  Gastrointestinal Nausea?: No Vomiting?: No Indigestion/heartburn?: No Diarrhea?: No Constipation?: No  Constitutional Fever: No Night sweats?: No Weight loss?: No Fatigue?: No  Skin Skin rash/lesions?: No Itching?: No  Eyes Blurred vision?: No Double vision?: No  Ears/Nose/Throat Sore throat?: No Sinus problems?: No  Hematologic/Lymphatic Swollen glands?: No Easy bruising?: No  Cardiovascular Chest pain?: No  Respiratory Cough?: No Shortness of breath?: No  Endocrine Excessive thirst?: No  Musculoskeletal Back pain?: No Joint pain?: No  Neurological Headaches?: No Dizziness?: No  Psychologic Depression?: No Anxiety?: No  Physical Exam: BP 126/78   Pulse 82   Ht '5\' 6"'$  (1.676 m)   Wt 180 lb (81.6 kg)   BMI 29.05 kg/m   Constitutional:  Alert and oriented, No acute distress. HEENT: [place] AT, moist mucus membranes.  Trachea midline, no masses. Cardiovascular: No clubbing, cyanosis, or edema.  Respiratory: Normal respiratory effort, no increased work of breathing.  GI: Abdomen is soft, nontender, nondistended, no abdominal masses GU: No CVA tenderness.  Skin: No rashes, bruises or suspicious lesions. Neurologic: Grossly intact, no focal deficits, moving all 4 extremities. Psychiatric: Normal mood and affect.  Laboratory Data: Lab Results  Component Value Date   WBC 19.0 (H) 06/17/2016   HGB 14.4 06/17/2016   HCT 43.5 06/17/2016   MCV 90.6 06/17/2016   PLT 171 06/17/2016    Lab Results  Component Value Date   CREATININE 0.74 06/17/2016   Pertinent Imaging: CLINICAL DATA:  61 year old female with right flank pain for 4 days. Associated diarrhea and Dysuria.  EXAM: CT ABDOMEN AND PELVIS WITHOUT  CONTRAST  TECHNIQUE: Multidetector CT imaging of the abdomen and pelvis was performed following the standard protocol without IV contrast.  COMPARISON:  Noncontrast CT Abdomen and Pelvis 04/08/2015  FINDINGS: Lower chest: Regressed but not resolved bilateral lower lobe atelectasis or scarring since 2017. New confluent atelectasis in the lateral basal segment of the right lower lobe. No pericardial or pleural effusion.  Hepatobiliary: Stable and negative; simple fluid density small hypodense re- demonstrated in the central right lobe.  Pancreas: Negative.  Spleen: Negative.  Adrenals/Urinary Tract: Normal adrenal glands.  Extensive left nephrolithiasis has not significantly changed since the prior CT. Small exophytic midpole cyst has simple fluid density. No left hydronephrosis and negative course of the left ureter.  Unremarkable urinary bladder.  Severe right hydronephrosis and hydroureter with perinephric and periureteral stranding to the level of a bulky 9 x 11 mm calculus (versus 2 adjacent calculi) located along the right pelvic sidewall about 5.5 cm from the right ureterovesical junction. See series 2, image 76 and coronal image 96. Numerous other right renal calculi, including additional bulky calculi in the right renal lower pole. Superimposed medial and lateral directed exophytic simple fluid density renal cysts.  Stomach/Bowel: Negative rectum. Moderate diverticulosis of the sigmoid colon. No active inflammation. Mild to moderate diverticulosis of the left colon and splenic flexure. No active inflammation. Occasional transverse and right colon diverticula. No active inflammation. Normal appendix. Negative terminal ileum. No dilated small bowel. Small gastric hiatal hernia is unchanged. Small 2 cm diverticulum of the second portion of the duodenum is unchanged. Otherwise negative stomach and duodenum.  Vascular/Lymphatic: Aortoiliac calcified  atherosclerosis. Vascular patency is not evaluated in the absence of IV contrast.  Reproductive: Surgically absent.  Other: No pelvic free fluid.  Musculoskeletal: No acute osseous abnormality identified.  IMPRESSION: 1. Severe acute obstructive uropathy on the right. Bulky 9 x 11 mm calculus (or adjacent calculi) in the distal right ureter about 5.5 cm proximal to the UVJ. 2. Extensive bilateral nephrolithiasis, including other bulky right lower pole renal calculi. 3. Right lower lobe atelectasis. 4. Calcified aortic atherosclerosis. 5. Colon diverticulosis.   Electronically Signed   By: Genevie Ann M.D.   On: 06/17/2016 14:59  CT scan images personally reviewed today, compared to previous CT renal stone in 03/2015.  Assessment & Plan:    1. Nephrolithiasis Interval progression of stone burden consistent with metabolically active stone disease 24 urine metabolic workup results reviewed No clear benefit to thiazide or potassium citrate based on parameters Reviewed generic stone prevention recommendations, advised to increase fluids to 3 L daiyl  2. Right renal cyst Incidental complex Bosniak 2 right renal cyst on renal ultrasound on 06/16/15 ---> no appreciated on most recent ultrasound  3. Right obstructing distal ureteral stone Given the size and location of the stone was unlikely to pass spontaneously Very specific warning symptoms are reviewed and indications for urgent or emergent intervention discussed Given previous failed  shockwave lithotripsies, would recommend ureteroscopic intervention. We'll also attempt to treat upper tract stones as time/ anatomy allows.  Risks and benefits of ureteroscopy were reviewed including but not limited to infection, bleeding, pain, ureteral injury which could require open surgery versus prolonged indwelling if ureteralperforation occurs, persistent stone disease, requirement for staged procedure, possible stent, and global anesthesia  risks. Patient expressed understanding and desires to proceed with ureteroscopy.  Schedule surgery as above   Hollice Espy, MD  [facility] [address] [place], [place] [address] [phone]  I spent 25 min with this patient of which greater than 50% was spent in counseling and coordination of care with the patient.

## 2016-06-25 NOTE — Interval H&P Note (Signed)
History and Physical Interval Note:  06/25/2016 10:50 AM  Felicia Manning  has presented today for surgery, with the diagnosis of right ureteral stent  The various methods of treatment have been discussed with the patient and family. After consideration of risks, benefits and other options for treatment, the patient has consented to  Procedure(s): URETEROSCOPY WITH HOLMIUM LASER LITHOTRIPSY (Right) CYSTOSCOPY WITH STENT PLACEMENT (Right) as a surgical intervention .  The patient's history has been reviewed, patient examined, no change in status, stable for surgery.  I have reviewed the patient's chart and labs.  Questions were answered to the patient's satisfaction.    RRR Lungs clear   Nickie Retort

## 2016-06-25 NOTE — Anesthesia Procedure Notes (Signed)
Procedure Name: Intubation Date/Time: 06/25/2016 11:20 AM Performed by: Hedda Slade Pre-anesthesia Checklist: Patient identified, Patient being monitored, Timeout performed, Emergency Drugs available and Suction available Patient Re-evaluated:Patient Re-evaluated prior to inductionOxygen Delivery Method: Circle system utilized Preoxygenation: Pre-oxygenation with 100% oxygen Intubation Type: IV induction Ventilation: Mask ventilation without difficulty Laryngoscope Size: Mac and 3 Grade View: Grade I Tube type: Oral Tube size: 7.0 mm Number of attempts: 1 Airway Equipment and Method: Stylet Placement Confirmation: ETT inserted through vocal cords under direct vision,  positive ETCO2 and breath sounds checked- equal and bilateral Secured at: 21 cm Tube secured with: Tape Dental Injury: Teeth and Oropharynx as per pre-operative assessment

## 2016-06-25 NOTE — Telephone Encounter (Signed)
The hospital called and made the follow up appointment for this patient:  "1 month follow up with Dr. Erlene Quan with a renal ultrasound prior".  Will you please place the order for the ultrasound?  Thank you.

## 2016-06-25 NOTE — Transfer of Care (Signed)
Immediate Anesthesia Transfer of Care Note  Patient: [name]  Procedure(s) Performed: Procedure(s): URETEROSCOPY WITH HOLMIUM LASER LITHOTRIPSY (Right) CYSTOSCOPY WITH STENT PLACEMENT (Right)  Patient Location: PACU  Anesthesia Type:General  Level of Consciousness: awake, alert  and oriented  Airway & Oxygen Therapy: Patient Spontanous Breathing and Patient connected to face mask oxygen  Post-op Assessment: Report given to RN and Post -op Vital signs reviewed and stable  Post vital signs: Reviewed and stable  Last Vitals:  Vitals:   06/25/16 1033 06/25/16 1205  BP: (!) 170/82 137/65  Pulse: 64 77  Resp: 16 (!) 23  Temp: 36.3 C 36.2 C    Last Pain:  Vitals:   06/25/16 1033  TempSrc: Tympanic  PainSc: 2          Complications: No apparent anesthesia complications

## 2016-06-25 NOTE — Anesthesia Post-op Follow-up Note (Cosign Needed)
Anesthesia QCDR form completed.        

## 2016-06-25 NOTE — Discharge Instructions (Signed)
° °  Intravenous Pyelogram, Care After These instructions give you information about caring for yourself after your procedure. Your doctor may also give you more specific instructions. Call your doctor if you have any problems or questions after your procedure. Follow these instructions at home:  Return to your normal activities as told by your doctor. Ask your doctor what activities are safe for you.  Drink enough fluid to keep your pee (urine) clear or pale yellow.  It is your responsibility to get the results of your procedure. Ask your doctor when your results will be ready. Contact a doctor if: You start peeing less than you usually do. Get help right away if:  You feel sick to your stomach (are nauseous).  You throw up (vomit).  You have itching.  You have trouble breathing.  Your throat swells.  You have chest pain.  You have chills or a fever. This information is not intended to replace advice given to you by your health care provider. Make sure you discuss any questions you have with your health care provider. Document Released: 10/23/2014 Document Revised: 07/10/2015 Document Reviewed: 04/18/2014 Elsevier Interactive Patient Education  2017 [employer]. '@[email or web address]'$ (Error - No data available.)'@AMBULATORY'$  SURGERY  DISCHARGE INSTRUCTIONS   1) The drugs that you were given will stay in your system until tomorrow so for the next 24 hours you should not:  A) Drive an automobile B) Make any legal decisions C) Drink any alcoholic beverage   2) You may resume regular meals tomorrow.  Today it is better to start with liquids and gradually work up to solid foods.  You may eat anything you prefer, but it is better to start with liquids, then soup and crackers, and gradually work up to solid foods.   3) Please notify your doctor immediately if you have any unusual bleeding, trouble breathing, redness and pain at the surgery site, drainage, fever, or pain not relieved by  medication.    4) Additional Instructions:        Please contact your physician with any problems or Same Day Surgery at [phone], Monday through Friday 6 am to 4 pm, or [place] at [facility] number at [phone].

## 2016-06-25 NOTE — Op Note (Signed)
Date of procedure: 06/25/16  Preoperative diagnosis:  1. Right ureteral stone 2. Right renal stones   Postoperative diagnosis:  1. Right renal stones 2. Right ureteral stone-passed   Procedure: 1. Cystoscopy 2. Right ureteroscopy 3. Laser lithotripsy 4. Right retrograde pyelogram with interpretation 5. Right ureteral stent placement 6 French by 26 cm with tether  Surgeon: Baruch Gouty, MD  Anesthesia: General  Complications: None  Intraoperative findings: The patient's 1.2 cm right ureteral stone had passed. On panureteroscopy, her ureter was quite capacious but there was no evidence of nephrolithiasis. On right pannephroscopy, she had 2-3 stones that were about 4 cm in size as well as some less than 1 mm fragments. These were dusted in their entirety. Pan nephroscopy did not reveal any other stones within the right collecting system. There was an infundibulum off the right lower pole where there appeared to be stones on fluoroscopy but the ureter scope was unable to advance into this infundibulum which likely mean the stones were in an obstructive diverticulum and should never cause the patient any issues. Right retrograde pyelogram within the procedure so no fillings defects and good drainage of contrast.   EBL: None  Specimens: None  Drains: Right 6 French by 26 cm double-J ureteral stent with tether  Disposition: Stable to the postanesthesia care unit  Indication for procedure: The patient is a 61 y.o. female with history of recurrent nephrolithiasis who was diagnosed to 1.2 cm right ureteral stone as well as recurrent right nephrolithiasis in her upper tract. She since today for definitive management..  After reviewing the management options for treatment, the patient elected to proceed with the above surgical procedure(s). We have discussed the potential benefits and risks of the procedure, side effects of the proposed treatment, the likelihood of the patient achieving the  goals of the procedure, and any potential problems that might occur during the procedure or recuperation. Informed consent has been obtained.  Description of procedure: The patient was met in the preoperative area. All risks, benefits, and indications of the procedure were described in great detail. The patient consented to the procedure. Preoperative antibiotics were given. The patient was taken to the operative theater. General anesthesia was induced per the anesthesia service. The patient was then placed in the dorsal lithotomy position and prepped and draped in the usual sterile fashion. A preoperative timeout was called.   A 21 French 30 cystoscope was inserted into the patient's bladder per urethra atraumatically. A sensor wire was advanced level of the renal pelvis under fluoroscopy on the right side. The semirigid ureteroscope was then inserted in the patient's right ureteral orifice. Her ureter was very very capacious however there is no stone located along its entirety session that she had passed a stone. A second sensor wire was then placed in the ureteroscope, and the scope was withdrawn. Over the sensor wires a ureteral access was then gently placed under fluoroscopy. With the aid of a flexible ureteroscope, Pan nephroscopy in the right side to place. There was 1-2 4 mm stones in the mid pole as well as one to two 4 mm stones in the lower pole. There is also multiple less than 1 mm fragments in the mid pole. These were all dusted in their entirety into L less than 1 mm. No stone specimens were taken as this patient is multiple stone analysis as in the past. There did appear to be a narrow infundibulum in the right lower pole that led to what appeared to be  stones on fluoroscopy. I was unable to advance the ureteroscope into this narrow opening. The stones were not addressed for this reason as it would likely never cause the patient issue. Pan nephroscopy this point revealed no further stone  burden. This confirmed with a right retrograde pyelogram showed no significant filling defects. The ureteral access sheath and flexible ureteroscope were withdrawn under direct visualization showing no fragments or trauma to the ureter. Over the remaining sensor wire 6 French by 26 cm double-J ureteral stent was tether was placed via the cystoscope. The sensor wire was removed. A curl seen in the right renal pelvis under fluoroscopy and in the urinary bladder under direct visualization. The cystoscope was carefully disassembled the bladder drained. The string was then tucked into the patient's vagina. A curl was seen both in the right renal pelvis and in the urinary bladder fluoroscopy was point confirming no dislocation of the stent. The patient was awoken from anesthesia and transferred in stable condition to the post anesthesia care unit.  Plan: The patient will pole on her tether to remove her stent in 3 days. The patient will follow-up in one month with her primary urologist, Dr. Erlene Quan, with a renal ultrasound prior to rule out iatrogenic hydronephrosis.  Baruch Gouty, M.D.

## 2016-06-28 ENCOUNTER — Encounter: Payer: Self-pay | Admitting: Urology

## 2016-06-28 NOTE — Telephone Encounter (Signed)
-----   Message from Nickie Retort, MD sent at 06/25/2016 12:05 PM EDT ----- Patient needs to see Dr. Erlene Quan in one month with renal ultrasound prior.

## 2016-06-28 NOTE — Anesthesia Postprocedure Evaluation (Signed)
Anesthesia Post Note  Patient: [name]  Procedure(s) Performed: Procedure(s) (LRB): URETEROSCOPY WITH HOLMIUM LASER LITHOTRIPSY (Right) CYSTOSCOPY WITH STENT PLACEMENT (Right)  Patient location during evaluation: PACU Anesthesia Type: General Level of consciousness: awake and alert Pain management: pain level controlled Vital Signs Assessment: post-procedure vital signs reviewed and stable Respiratory status: spontaneous breathing, nonlabored ventilation, respiratory function stable and patient connected to nasal cannula oxygen Cardiovascular status: blood pressure returned to baseline and stable Postop Assessment: no signs of nausea or vomiting Anesthetic complications: no     Last Vitals:  Vitals:   06/25/16 1307 06/25/16 1346  BP: (!) 147/66 (!) 150/67  Pulse: 65   Resp: 12   Temp: 36.8 C     Last Pain:  Vitals:   06/28/16 0850  TempSrc:   PainSc: 0-No pain                 Felicia Manning

## 2016-06-28 NOTE — Telephone Encounter (Signed)
done

## 2016-07-23 ENCOUNTER — Ambulatory Visit
Admission: RE | Admit: 2016-07-23 | Discharge: 2016-07-23 | Disposition: A | Discharge status: Home / Self Care | Payer: 59 | Source: Ambulatory Visit | Attending: Urology | Admitting: Urology

## 2016-07-23 DIAGNOSIS — N200 Calculus of kidney: Secondary | ICD-10-CM | POA: Diagnosis not present

## 2016-07-23 DIAGNOSIS — N281 Cyst of kidney, acquired: Secondary | ICD-10-CM | POA: Diagnosis not present

## 2016-07-28 ENCOUNTER — Ambulatory Visit (INDEPENDENT_AMBULATORY_CARE_PROVIDER_SITE_OTHER): Payer: 59 | Admitting: Urology

## 2016-07-28 ENCOUNTER — Encounter: Payer: Self-pay | Admitting: Urology

## 2016-07-28 VITALS — BP 112/63 | HR 83 | Ht 66.0 in | Wt 185.0 lb

## 2016-07-28 DIAGNOSIS — N200 Calculus of kidney: Secondary | ICD-10-CM

## 2016-07-28 DIAGNOSIS — N281 Cyst of kidney, acquired: Secondary | ICD-10-CM

## 2016-07-28 NOTE — Progress Notes (Signed)
3:13 PM  07/28/16   Felicia Manning 1955-07-01 [id number]  Referring provider: Glendon Axe, MD [facility] [place], [place] [address]  Chief Complaint  Patient presents with  . Results    2month w/RUS     HPI:  61 year old female with a history of recurrent nephrolithiasis who presents for follow-up following ureteroscopy by Dr. Pilar Jarvis on 06/25/2016 for treatment of an 11 millimeter distal right ureteral stone. Intraoperatively, she passed the stone and several smaller right nonobstructing calculi were treated. 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  Follow-up renal ultrasound shows no evidence of residual hydronephrosis. Bilateral consultations are present.   Stone analysis consistent with 65% calcium oxalate monohydrate, 3% calcium oxalate dihydrate, and 32% calcium phosphate.  She does have a personal history of stones which have required surgical intervetnion on multiple occasions including ureteroscopy x multiple and ESWL (unsuccessful).    She undergo 24 hour urine metabolic workup which revealed a fairly decent urinary volume of 2.2 L, no evidence of hypercalcemia..No other obvious pathologic findings. She did have a good urinary citrate. See Epic for details.   PMH: Past Medical History:  Diagnosis Date  . Barrett's esophagus   . Dysrhythmia    stress related at work.  Marland Kitchen GERD (gastroesophageal reflux disease)   . Hepatitis C 2008  . Hyperlipidemia   . Hypertension   . Hypothyroidism   . Nephrolithiasis   . Osteoporosis   . Thyroid cancer ([place]) 2012  . Thyroid cancer [facility])     Surgical History: Past Surgical History:  Procedure Laterality Date  . ABDOMINAL HYSTERECTOMY  2006  . COLONOSCOPY WITH PROPOFOL N/A 04/25/2015   Procedure: COLONOSCOPY WITH PROPOFOL;  Surgeon: Josefine Class, MD;  Location: [facility] ENDOSCOPY;  Service:  Endoscopy;  Laterality: N/A;  . CYSTOSCOPY W/ RETROGRADES Bilateral 05/19/2015   Procedure: CYSTOSCOPY WITH RETROGRADE PYELOGRAM;  Surgeon: Hollice Espy, MD;  Location: [facility] ORS;  Service: Urology;  Laterality: Bilateral;  . CYSTOSCOPY W/ URETERAL STENT PLACEMENT Right 05/26/2015   Procedure: CYSTOSCOPY WITH STENT REPLACEMENT;  Surgeon: Hollice Espy, MD;  Location: [facility] ORS;  Service: Urology;  Laterality: Right;  . CYSTOSCOPY WITH STENT PLACEMENT Right 05/19/2015   Procedure: CYSTOSCOPY WITH STENT PLACEMENT;  Surgeon: Hollice Espy, MD;  Location: [facility] ORS;  Service: Urology;  Laterality: Right;  . CYSTOSCOPY WITH STENT PLACEMENT Right 05/26/2015   Procedure: CYSTOSCOPY WITH STENT PLACEMENT;  Surgeon: Hollice Espy, MD;  Location: [facility] ORS;  Service: Urology;  Laterality: Right;  . CYSTOSCOPY WITH STENT PLACEMENT Right 06/25/2016   Procedure: CYSTOSCOPY WITH STENT PLACEMENT;  Surgeon: Nickie Retort, MD;  Location: [facility] ORS;  Service: Urology;  Laterality: Right;  . CYSTOSCOPY/URETEROSCOPY/HOLMIUM LASER/STENT PLACEMENT Left 07/23/2015   Procedure: CYSTOSCOPY/URETEROSCOPY/HOLMIUM LASER/STENT PLACEMENT/RETROGRADE PYELOGRAM;  Surgeon: Hollice Espy, MD;  Location: [facility] ORS;  Service: Urology;  Laterality: Left;  . ESOPHAGOGASTRODUODENOSCOPY (EGD) WITH PROPOFOL N/A 04/25/2015   Procedure: ESOPHAGOGASTRODUODENOSCOPY (EGD) WITH PROPOFOL;  Surgeon: Josefine Class, MD;  Location: [facility] ENDOSCOPY;  Service: Endoscopy;  Laterality: N/A;  . EYE SURGERY Bilateral 1964   lazy eye repair  . EYE SURGERY Bilateral 2001   lazy eye repair  . LITHOTRIPSY  2009  . THYROIDECTOMY  2010  . URETEROSCOPY WITH HOLMIUM LASER LITHOTRIPSY Right 05/19/2015   Procedure: URETEROSCOPY WITH HOLMIUM LASER LITHOTRIPSY;  Surgeon: Hollice Espy, MD;  Location: [facility] ORS;  Service: Urology;  Laterality: Right;  . URETEROSCOPY WITH HOLMIUM LASER  LITHOTRIPSY Right 05/26/2015   Procedure: URETEROSCOPY WITH HOLMIUM LASER  LITHOTRIPSY;  Surgeon: Hollice Espy, MD;  Location: [facility] ORS;  Service: Urology;  Laterality: Right;  . URETEROSCOPY WITH HOLMIUM LASER LITHOTRIPSY Right 06/25/2016   Procedure: URETEROSCOPY WITH HOLMIUM LASER LITHOTRIPSY;  Surgeon: Nickie Retort, MD;  Location: [facility] ORS;  Service: Urology;  Laterality: Right;    Home Medications:  Allergies as of 07/28/2016   No Known Allergies     Medication List       Accurate as of 07/28/16  3:13 PM. Always use your most recent med list.          levothyroxine 150 MCG tablet Commonly known as:  SYNTHROID, LEVOTHROID Take 150 mcg by mouth daily at 10 pm. (2300)   losartan 100 MG tablet Commonly known as:  COZAAR Take 100 mg by mouth every morning.   metoprolol succinate 25 MG 24 hr tablet Commonly known as:  TOPROL-XL Take 25 mg by mouth every morning.   omeprazole 20 MG capsule Commonly known as:  PRILOSEC Take 20 mg by mouth every morning.       Allergies: No Known Allergies  Family History: Family History  Problem Relation Age of Onset  . CAD Mother   . Bladder Cancer Neg Hx   . Prostate cancer Neg Hx   . Kidney cancer Neg Hx     Social History:  reports that she has been smoking Cigarettes.  She has a 40.00 pack-year smoking history. She has never used smokeless tobacco. She reports that she does not drink alcohol or use drugs.  ROS: UROLOGY Frequent Urination?: Yes Hard to postpone urination?: Yes Burning/pain with urination?: No Get up at night to urinate?: No Leakage of urine?: Yes Urine stream starts and stops?: No Trouble starting stream?: No Do you have to strain to urinate?: No Blood in urine?: No Urinary tract infection?: No Sexually transmitted disease?: No Injury to kidneys or bladder?: No Painful intercourse?: No Weak stream?: No Currently pregnant?: No Vaginal bleeding?: No Last menstrual period?: n  Gastrointestinal Nausea?: No Vomiting?: No Indigestion/heartburn?: No Diarrhea?:  No Constipation?: No  Constitutional Fever: No Night sweats?: No Weight loss?: No Fatigue?: No  Skin Skin rash/lesions?: No Itching?: No  Eyes Blurred vision?: No Double vision?: No  Ears/Nose/Throat Sore throat?: No Sinus problems?: No  Hematologic/Lymphatic Swollen glands?: No Easy bruising?: No  Cardiovascular Leg swelling?: No Chest pain?: No  Respiratory Cough?: No Shortness of breath?: No  Endocrine Excessive thirst?: No  Musculoskeletal Back pain?: No Joint pain?: No  Neurological Headaches?: No Dizziness?: No  Psychologic Depression?: No Anxiety?: No  Physical Exam: BP 112/63   Pulse 83   Ht 5\' 6"  (1.676 m)   Wt 185 lb (83.9 kg)   BMI 29.86 kg/m   Constitutional:  Alert and oriented, No acute distress. HEENT: [place] AT, moist mucus membranes.  Trachea midline, no masses. Cardiovascular: No clubbing, cyanosis, or edema.  Respiratory: Normal respiratory effort, no increased work of breathing.  GI: Abdomen is soft, nontender, nondistended, no abdominal masses GU: No CVA tenderness.  Skin: No rashes, bruises or suspicious lesions. Neurologic: Grossly intact, no focal deficits, moving all 4 extremities. Psychiatric: Normal mood and affect.  Laboratory Data: Lab Results  Component Value Date   WBC 19.0 (H) 06/17/2016   HGB 14.4 06/17/2016   HCT 43.5 06/17/2016   MCV 90.6 06/17/2016   PLT 171 06/17/2016    Lab Results  Component Value Date   CREATININE 0.74 06/17/2016  Pertinent Imaging: CLINICAL DATA:  Nephrolithiasis.  EXAM: RENAL / URINARY TRACT ULTRASOUND COMPLETE  COMPARISON:  CT 06/17/2016, 04/08/2015.  Ultrasound 10/23/2015  FINDINGS: Right Kidney:  Length: 11.8 cm. Echogenicity within normal limits. 5.2 x 3.9 x 5.5 cm thinly septated cyst midportion right kidney, stable from prior exam. 4.9 x 3.2 x 3.2 cm cyst with thin septation noted in the midportion of the right kidney. Similar findings noted on prior exams.  Multiple nonobstructing stones noted. Largest measures 6.6 mm. No hydronephrosis visualized on today's exam. Previously identified right hydronephrosis has resolved.  Left Kidney:  Length: 11.6 cm. Echogenicity within normal limits. 0.8 x 1.0 x 1.6 cm and 1.3 x 1.6 x 1.9 cm simple cyst again noted. Multiple stones are again noted. Largest measures 6.5 mm  Bladder:  Appears normal for degree of bladder distention. Prevoid volume 67 cc, postvoid volume 2 cc.  IMPRESSION: 1. Obstructing nephrolithiasis again noted. Previously identified right hydronephrosis has resolved.  2. Bilateral benign-appearing cysts are again noted. Similar findings noted on multiple prior exams .   Electronically Signed   By: Marcello Moores  Register   On: 07/23/2016 16:23  Renal ultrasound was personally reviewed today with the patient.  Assessment & Plan:    1.   Right distal ureteral stone Past spontaneously. Ureteroscopy, nonobstructing stones treated on the right No residual hydronephrosis on follow up ultrasound  2. Recurrent nephrolithiasis Previous 24 hour urine  No clear benefit to thiazide or potassium citrate based on parameters Discussed possible addition of above, risks and benefits- declined at this time Reviewed generic stone prevention recommendations, advised to increase fluids to 3 L daiyl  3. Right renal cyst Incidental complex Bosniak 2 right renal cyst on renal ultrasound on 07/2016 Stable in size Will continue to follow conservatvely-consider RUS in 1 year  Return in about 1 year (around 07/28/2017) for KUB.  Hollice Espy, MD  [facility] [address] [place], [place] [address] [phone]

## 2016-11-24 ENCOUNTER — Other Ambulatory Visit: Payer: Self-pay | Admitting: Internal Medicine

## 2016-11-24 DIAGNOSIS — Z1231 Encounter for screening mammogram for malignant neoplasm of breast: Secondary | ICD-10-CM

## 2016-12-08 ENCOUNTER — Ambulatory Visit
Admission: RE | Admit: 2016-12-08 | Discharge: 2016-12-08 | Disposition: A | Discharge status: Home / Self Care | Payer: 59 | Source: Ambulatory Visit | Attending: Internal Medicine | Admitting: Internal Medicine

## 2016-12-08 DIAGNOSIS — Z1231 Encounter for screening mammogram for malignant neoplasm of breast: Secondary | ICD-10-CM | POA: Diagnosis not present

## 2017-06-18 ENCOUNTER — Emergency Department: Payer: 59

## 2017-06-18 ENCOUNTER — Other Ambulatory Visit: Payer: Self-pay

## 2017-06-18 ENCOUNTER — Encounter: Payer: Self-pay | Admitting: Emergency Medicine

## 2017-06-18 ENCOUNTER — Inpatient Hospital Stay
Admission: EM | Admit: 2017-06-18 | Discharge: 2017-06-20 | DRG: 392 | Disposition: A | Discharge status: Home / Self Care | Payer: 59 | Attending: Internal Medicine | Admitting: Internal Medicine

## 2017-06-18 DIAGNOSIS — K219 Gastro-esophageal reflux disease without esophagitis: Secondary | ICD-10-CM | POA: Diagnosis present

## 2017-06-18 DIAGNOSIS — I10 Essential (primary) hypertension: Secondary | ICD-10-CM | POA: Diagnosis present

## 2017-06-18 DIAGNOSIS — K5732 Diverticulitis of large intestine without perforation or abscess without bleeding: Principal | ICD-10-CM | POA: Diagnosis present

## 2017-06-18 DIAGNOSIS — D72829 Elevated white blood cell count, unspecified: Secondary | ICD-10-CM | POA: Diagnosis present

## 2017-06-18 DIAGNOSIS — Z79890 Hormone replacement therapy (postmenopausal): Secondary | ICD-10-CM

## 2017-06-18 DIAGNOSIS — K5792 Diverticulitis of intestine, part unspecified, without perforation or abscess without bleeding: Secondary | ICD-10-CM | POA: Diagnosis not present

## 2017-06-18 DIAGNOSIS — Z8249 Family history of ischemic heart disease and other diseases of the circulatory system: Secondary | ICD-10-CM

## 2017-06-18 DIAGNOSIS — E890 Postprocedural hypothyroidism: Secondary | ICD-10-CM | POA: Diagnosis present

## 2017-06-18 DIAGNOSIS — Z90710 Acquired absence of both cervix and uterus: Secondary | ICD-10-CM

## 2017-06-18 DIAGNOSIS — K5900 Constipation, unspecified: Secondary | ICD-10-CM | POA: Diagnosis present

## 2017-06-18 DIAGNOSIS — Z79899 Other long term (current) drug therapy: Secondary | ICD-10-CM

## 2017-06-18 DIAGNOSIS — F17210 Nicotine dependence, cigarettes, uncomplicated: Secondary | ICD-10-CM | POA: Diagnosis present

## 2017-06-18 DIAGNOSIS — Z85850 Personal history of malignant neoplasm of thyroid: Secondary | ICD-10-CM

## 2017-06-18 DIAGNOSIS — R1030 Lower abdominal pain, unspecified: Secondary | ICD-10-CM

## 2017-06-18 LAB — CBC
HCT: 40.5 % (ref 35.0–47.0)
Hemoglobin: 13.5 g/dL (ref 12.0–16.0)
MCH: 30.6 pg (ref 26.0–34.0)
MCHC: 33.4 g/dL (ref 32.0–36.0)
MCV: 91.5 fL (ref 80.0–100.0)
Platelets: 238 10*3/uL (ref 150–440)
RBC: 4.42 MIL/uL (ref 3.80–5.20)
RDW: 14.3 % (ref 11.5–14.5)
WBC: 22.9 10*3/uL — ABNORMAL HIGH (ref 3.6–11.0)

## 2017-06-18 LAB — CBC WITH DIFFERENTIAL/PLATELET
Basophils Absolute: 0.1 10*3/uL (ref 0–0.1)
Basophils Relative: 1 %
Eosinophils Absolute: 0.1 10*3/uL (ref 0–0.7)
Eosinophils Relative: 0 %
HCT: 37.8 % (ref 35.0–47.0)
Hemoglobin: 12.4 g/dL (ref 12.0–16.0)
Lymphocytes Relative: 14 %
Lymphs Abs: 3 10*3/uL (ref 1.0–3.6)
MCH: 30.3 pg (ref 26.0–34.0)
MCHC: 33 g/dL (ref 32.0–36.0)
MCV: 92 fL (ref 80.0–100.0)
Monocytes Absolute: 1.8 10*3/uL — ABNORMAL HIGH (ref 0.2–0.9)
Monocytes Relative: 8 %
Neutro Abs: 16.6 10*3/uL — ABNORMAL HIGH (ref 1.4–6.5)
Neutrophils Relative %: 77 %
Platelets: 230 10*3/uL (ref 150–440)
RBC: 4.11 MIL/uL (ref 3.80–5.20)
RDW: 14.4 % (ref 11.5–14.5)
WBC: 21.7 10*3/uL — ABNORMAL HIGH (ref 3.6–11.0)

## 2017-06-18 LAB — COMPREHENSIVE METABOLIC PANEL
ALT: 15 U/L (ref 14–54)
AST: 17 U/L (ref 15–41)
Albumin: 3.7 g/dL (ref 3.5–5.0)
Alkaline Phosphatase: 85 U/L (ref 38–126)
Anion gap: 9 (ref 5–15)
BUN: 15 mg/dL (ref 6–20)
CO2: 27 mmol/L (ref 22–32)
Calcium: 8.4 mg/dL — ABNORMAL LOW (ref 8.9–10.3)
Chloride: 100 mmol/L — ABNORMAL LOW (ref 101–111)
Creatinine, Ser: 1.16 mg/dL — ABNORMAL HIGH (ref 0.44–1.00)
GFR calc Af Amer: 59 mL/min — ABNORMAL LOW (ref >60)
GFR calc non Af Amer: 50 mL/min — ABNORMAL LOW (ref >60)
Glucose, Bld: 100 mg/dL — ABNORMAL HIGH (ref 65–99)
Potassium: 3.7 mmol/L (ref 3.5–5.1)
Sodium: 136 mmol/L (ref 135–145)
Total Bilirubin: 1.1 mg/dL (ref 0.3–1.2)
Total Protein: 7.1 g/dL (ref 6.5–8.1)

## 2017-06-18 LAB — LIPASE, BLOOD: Lipase: 27 U/L (ref 11–51)

## 2017-06-18 LAB — LACTIC ACID, PLASMA: Lactic Acid, Venous: 0.8 mmol/L (ref 0.5–1.9)

## 2017-06-18 MED ORDER — ONDANSETRON HCL 4 MG/2ML IJ SOLN: 4.0000 mg | Freq: Four times a day (QID) | INTRAMUSCULAR | Status: DC | PRN

## 2017-06-18 MED ORDER — SODIUM CHLORIDE 0.9 % IV BOLUS
500.0000 mL | Freq: Once | INTRAVENOUS | Status: AC
  Administered 2017-06-18: 500 mL via INTRAVENOUS

## 2017-06-18 MED ORDER — PIPERACILLIN-TAZOBACTAM 3.375 G IVPB 30 MIN
3.3750 g | Freq: Once | INTRAVENOUS | Status: AC
  Administered 2017-06-18: 3.375 g via INTRAVENOUS
  Filled 2017-06-18: qty 50

## 2017-06-18 MED ORDER — ONDANSETRON HCL 4 MG/2ML IJ SOLN
4.0000 mg | Freq: Once | INTRAMUSCULAR | Status: AC
  Administered 2017-06-18: 4 mg via INTRAVENOUS
  Filled 2017-06-18: qty 2

## 2017-06-18 MED ORDER — SODIUM CHLORIDE 0.9 % IV SOLN
INTRAVENOUS | Status: DC
  Administered 2017-06-18 – 2017-06-20 (×3): via INTRAVENOUS

## 2017-06-18 MED ORDER — NICOTINE 21 MG/24HR TD PT24
21.0000 mg | MEDICATED_PATCH | Freq: Every day | TRANSDERMAL | Status: DC
  Administered 2017-06-18 – 2017-06-20 (×3): 21 mg via TRANSDERMAL
  Filled 2017-06-18 (×3): qty 1

## 2017-06-18 MED ORDER — IOPAMIDOL (ISOVUE-370) INJECTION 76%
75.0000 mL | Freq: Once | INTRAVENOUS | Status: AC | PRN
  Administered 2017-06-18: 75 mL via INTRAVENOUS

## 2017-06-18 MED ORDER — MORPHINE SULFATE (PF) 4 MG/ML IV SOLN
4.0000 mg | Freq: Once | INTRAVENOUS | Status: AC
  Administered 2017-06-18: 4 mg via INTRAVENOUS
  Filled 2017-06-18: qty 1

## 2017-06-18 MED ORDER — ACETAMINOPHEN 650 MG RE SUPP: 650.0000 mg | Freq: Four times a day (QID) | RECTAL | Status: DC | PRN

## 2017-06-18 MED ORDER — SODIUM CHLORIDE 0.9 % IV SOLN
Freq: Once | INTRAVENOUS | Status: AC
  Administered 2017-06-18: 18:00:00 via INTRAVENOUS

## 2017-06-18 MED ORDER — ENOXAPARIN SODIUM 40 MG/0.4ML ~~LOC~~ SOLN
40.0000 mg | SUBCUTANEOUS | Status: DC
  Administered 2017-06-18 – 2017-06-19 (×2): 40 mg via SUBCUTANEOUS
  Filled 2017-06-18 (×2): qty 0.4

## 2017-06-18 MED ORDER — ONDANSETRON HCL 4 MG PO TABS: 4.0000 mg | ORAL_TABLET | Freq: Four times a day (QID) | ORAL | Status: DC | PRN

## 2017-06-18 MED ORDER — PIPERACILLIN-TAZOBACTAM 3.375 G IVPB
3.3750 g | Freq: Three times a day (TID) | INTRAVENOUS | Status: DC
  Administered 2017-06-18 – 2017-06-20 (×5): 3.375 g via INTRAVENOUS
  Filled 2017-06-18 (×5): qty 50

## 2017-06-18 MED ORDER — LEVOTHYROXINE SODIUM 50 MCG PO TABS
150.0000 ug | ORAL_TABLET | Freq: Every day | ORAL | Status: DC
Start: 2017-06-18 — End: 2017-06-20
  Administered 2017-06-18 – 2017-06-19 (×2): 150 ug via ORAL
  Filled 2017-06-18 (×2): qty 1

## 2017-06-18 MED ORDER — ACETAMINOPHEN 325 MG PO TABS: 650.0000 mg | ORAL_TABLET | Freq: Four times a day (QID) | ORAL | Status: DC | PRN

## 2017-06-18 MED ORDER — PANTOPRAZOLE SODIUM 40 MG PO TBEC
40.0000 mg | DELAYED_RELEASE_TABLET | Freq: Every day | ORAL | Status: DC
  Administered 2017-06-19 – 2017-06-20 (×2): 40 mg via ORAL
  Filled 2017-06-18 (×2): qty 1

## 2017-06-18 MED ORDER — VITAMIN D 1000 UNITS PO TABS
2000.0000 [IU] | ORAL_TABLET | Freq: Every day | ORAL | Status: DC
  Administered 2017-06-19 – 2017-06-20 (×2): 2000 [IU] via ORAL
  Filled 2017-06-18 (×2): qty 2

## 2017-06-18 NOTE — H&P (Signed)
[facility] [place] NAME: Felicia Manning    MR#:  [id number]  DATE OF BIRTH:  01-27-1956  DATE OF ADMISSION:  06/18/2017  PRIMARY CARE PHYSICIAN: Glendon Axe, MD   REQUESTING/REFERRING PHYSICIAN: Dr. Lenise Arena  CHIEF COMPLAINT:  No chief complaint on file. Abdominal pain, nausea.  HISTORY OF PRESENT ILLNESS:  Felicia Manning  is a 62 y.o. female with a known history of hepatitis C, hypertension, hyperlipidemia, hypothyroidism, previous history of nephrolithiasis, osteoporosis, presents to the hospital due to abdominal cramping, bloating and nausea.  Patient says she developed some abdominal bloating and distention yesterday, along with some low-grade fever.  She called her primary care physician who recommended that she take some Tylenol for fever and if she was not feeling better to go to urgent care the next day.  Patient went to urgent care and was referred to the hospital due to a leukocytosis.  Patient underwent a CT scan of her abdomen pelvis here in the ER which was suggestive of acute diverticulitis.  Hospitalist services were contacted for treatment evaluation.  Patient denies any chest pains, shortness of breath, cough, congestion, dysuria, hematuria, diarrhea, melena, hematochezia.  PAST MEDICAL HISTORY:   Past Medical History:  Diagnosis Date  . Barrett's esophagus   . Dysrhythmia    stress related at work.  Marland Kitchen GERD (gastroesophageal reflux disease)   . Hepatitis C 2008  . Hyperlipidemia   . Hypertension   . Hypothyroidism   . Nephrolithiasis   . Osteoporosis   . Thyroid cancer ([place]) 2012  . Thyroid cancer ([place])     PAST SURGICAL HISTORY:   Past Surgical History:  Procedure Laterality Date  . ABDOMINAL HYSTERECTOMY  2006  . COLONOSCOPY WITH PROPOFOL N/A 04/25/2015   Procedure: COLONOSCOPY WITH PROPOFOL;  Surgeon: Josefine Class, MD;  Location: [facility] ENDOSCOPY;  Service: Endoscopy;  Laterality: N/A;  .  CYSTOSCOPY W/ RETROGRADES Bilateral 05/19/2015   Procedure: CYSTOSCOPY WITH RETROGRADE PYELOGRAM;  Surgeon: Hollice Espy, MD;  Location: [facility] ORS;  Service: Urology;  Laterality: Bilateral;  . CYSTOSCOPY W/ URETERAL STENT PLACEMENT Right 05/26/2015   Procedure: CYSTOSCOPY WITH STENT REPLACEMENT;  Surgeon: Hollice Espy, MD;  Location: [facility] ORS;  Service: Urology;  Laterality: Right;  . CYSTOSCOPY WITH STENT PLACEMENT Right 05/19/2015   Procedure: CYSTOSCOPY WITH STENT PLACEMENT;  Surgeon: Hollice Espy, MD;  Location: [facility] ORS;  Service: Urology;  Laterality: Right;  . CYSTOSCOPY WITH STENT PLACEMENT Right 05/26/2015   Procedure: CYSTOSCOPY WITH STENT PLACEMENT;  Surgeon: Hollice Espy, MD;  Location: [facility] ORS;  Service: Urology;  Laterality: Right;  . CYSTOSCOPY WITH STENT PLACEMENT Right 06/25/2016   Procedure: CYSTOSCOPY WITH STENT PLACEMENT;  Surgeon: Nickie Retort, MD;  Location: [facility] ORS;  Service: Urology;  Laterality: Right;  . CYSTOSCOPY/URETEROSCOPY/HOLMIUM LASER/STENT PLACEMENT Left 07/23/2015   Procedure: CYSTOSCOPY/URETEROSCOPY/HOLMIUM LASER/STENT PLACEMENT/RETROGRADE PYELOGRAM;  Surgeon: Hollice Espy, MD;  Location: [facility] ORS;  Service: Urology;  Laterality: Left;  . ESOPHAGOGASTRODUODENOSCOPY (EGD) WITH PROPOFOL N/A 04/25/2015   Procedure: ESOPHAGOGASTRODUODENOSCOPY (EGD) WITH PROPOFOL;  Surgeon: Josefine Class, MD;  Location: [facility] ENDOSCOPY;  Service: Endoscopy;  Laterality: N/A;  . EYE SURGERY Bilateral 1964   lazy eye repair  . EYE SURGERY Bilateral 2001   lazy eye repair  . LITHOTRIPSY  2009  . THYROIDECTOMY  2010  . URETEROSCOPY WITH HOLMIUM LASER LITHOTRIPSY Right 05/19/2015   Procedure: URETEROSCOPY WITH HOLMIUM LASER LITHOTRIPSY;  Surgeon: Hollice Espy, MD;  Location: [facility] ORS;  Service: Urology;  Laterality: Right;  . URETEROSCOPY WITH HOLMIUM LASER LITHOTRIPSY Right 05/26/2015   Procedure: URETEROSCOPY WITH HOLMIUM LASER LITHOTRIPSY;  Surgeon: Hollice Espy, MD;   Location: [facility] ORS;  Service: Urology;  Laterality: Right;  . URETEROSCOPY WITH HOLMIUM LASER LITHOTRIPSY Right 06/25/2016   Procedure: URETEROSCOPY WITH HOLMIUM LASER LITHOTRIPSY;  Surgeon: Nickie Retort, MD;  Location: [facility] ORS;  Service: Urology;  Laterality: Right;    SOCIAL HISTORY:   Social History   Tobacco Use  . Smoking status: Current Every Day Smoker    Packs/day: 1.00    Years: 40.00    Pack years: 40.00    Types: Cigarettes  . Smokeless tobacco: Never Used  Substance Use Topics  . Alcohol use: Yes    Comment: socially    FAMILY HISTORY:   Family History  Problem Relation Age of Onset  . CAD Mother   . Heart disease Mother   . Dementia Father   . Bladder Cancer Neg Hx   . Prostate cancer Neg Hx   . Kidney cancer Neg Hx     DRUG ALLERGIES:  No Known Allergies  REVIEW OF SYSTEMS:   Review of Systems  Constitutional: Negative for fever and weight loss.  HENT: Negative for congestion, nosebleeds and tinnitus.   Eyes: Negative for blurred vision, double vision and redness.  Respiratory: Negative for cough, hemoptysis and shortness of breath.   Cardiovascular: Negative for chest pain, orthopnea, leg swelling and PND.  Gastrointestinal: Positive for abdominal pain and nausea. Negative for diarrhea, melena and vomiting.  Genitourinary: Negative for dysuria, hematuria and urgency.  Musculoskeletal: Negative for falls and joint pain.  Neurological: Negative for dizziness, tingling, sensory change, focal weakness, seizures, weakness and headaches.  Endo/Heme/Allergies: Negative for polydipsia. Does not bruise/bleed easily.  Psychiatric/Behavioral: Negative for depression and memory loss. The patient is not nervous/anxious.     MEDICATIONS AT HOME:   Prior to Admission medications   Medication Sig Start Date End Date Taking? Authorizing Provider  amLODipine (NORVASC) 10 MG tablet Take 10 mg by mouth daily.   Yes [provider]  cholecalciferol  (VITAMIN D) 1000 units tablet Take 2,000 Units by mouth daily.   Yes [provider]  levothyroxine (SYNTHROID, LEVOTHROID) 150 MCG tablet Take 150 mcg by mouth daily at 10 pm.    Yes [provider]  losartan (COZAAR) 100 MG tablet Take 100 mg by mouth every morning.   Yes [provider]  metoprolol succinate (TOPROL-XL) 25 MG 24 hr tablet Take 25 mg by mouth every morning.    Yes [provider]  omeprazole (PRILOSEC) 20 MG capsule Take 20 mg by mouth every morning.   Yes [provider]      VITAL SIGNS:  Blood pressure (!) 91/55, pulse 68, temperature 98.6 F (37 C), temperature source Oral, resp. rate 16, height 5\' 5"  (1.651 m), weight 83.9 kg (185 lb), SpO2 92 %.  PHYSICAL EXAMINATION:  Physical Exam  GENERAL:  62 y.o.-year-old patient lying in the bed with no acute distress.  EYES: Pupils equal, round, reactive to light and accommodation. No scleral icterus. Extraocular muscles intact.  HEENT: Head atraumatic, normocephalic. Oropharynx and nasopharynx clear. No oropharyngeal erythema, moist oral mucosa  NECK:  Supple, no jugular venous distention. No thyroid enlargement, no tenderness.  LUNGS: Normal breath sounds bilaterally, no wheezing, rales, rhonchi. No use of accessory muscles of respiration.  CARDIOVASCULAR: S1, S2 RRR. No murmurs, rubs, gallops, clicks.  ABDOMEN: Soft, tender in the left lower quadrant and  periumbilical area, no rebound, rigidity nondistended. Bowel sounds present. No organomegaly or mass.  EXTREMITIES: No pedal edema, cyanosis, or clubbing. + 2 pedal & radial pulses b/l.   NEUROLOGIC: Cranial nerves II through XII are intact. No focal Motor or sensory deficits appreciated b/l PSYCHIATRIC: The patient is alert and oriented x 3.  SKIN: No obvious rash, lesion, or ulcer.   LABORATORY PANEL:   CBC Recent Labs  Lab 06/18/17 1704  WBC 21.7*  HGB 12.4  HCT 37.8  PLT 230    ------------------------------------------------------------------------------------------------------------------  Chemistries  Recent Labs  Lab 06/18/17 1413  NA 136  K 3.7  CL 100*  CO2 27  GLUCOSE 100*  BUN 15  CREATININE 1.16*  CALCIUM 8.4*  AST 17  ALT 15  ALKPHOS 85  BILITOT 1.1   ------------------------------------------------------------------------------------------------------------------  Cardiac Enzymes No results for input(s): TROPONINI in the last 168 hours. ------------------------------------------------------------------------------------------------------------------  RADIOLOGY:  Ct Abdomen Pelvis W Contrast  Result Date: 06/18/2017 CLINICAL DATA:  Abdominopelvic pain for 3 days. No bowel movement since Thursday. EXAM: CT ABDOMEN AND PELVIS WITH CONTRAST TECHNIQUE: Multidetector CT imaging of the abdomen and pelvis was performed using the standard protocol following bolus administration of intravenous contrast. CONTRAST:  49mL ISOVUE-370 IOPAMIDOL (ISOVUE-370) INJECTION 76% COMPARISON:  CT 06/17/2016 FINDINGS: Lower chest: Lung bases are clear. Hepatobiliary: Hypodense lesion in the RIGHT hepatic lobe has simple fluid attenuation consistent with simple cyst no biliary duct dilatation. Normal gallbladder Pancreas: Pancreas is normal. No ductal dilatation. No pancreatic inflammation. Rounded lesion associated with the tail the pancreas measures 19 mm (image 25/2) which compared to 17 mm on CT 04/08/2015 for no significant change in size. This is favored a splenule. Spleen: Normal spleen Adrenals/urinary tract: Adrenal glands are normal. Bilateral simple fluid attenuation renal cysts lesions. Bilateral nonobstructing renal calculi. Ureters normal. Bladder normal Stomach/Bowel: Stomach, duodenum, pending send cecum normal. Ascending and transverse colon normal. There are multiple diverticula along the proximal sigmoid colon. There is a 8 cm segment of bowel wall  thickening of proximal sigmoid colon through this region of diverticulosis. There is inflammation and stranding in the sigmoid mesocolon adjacent to the bowel wall thickening (image 76/2 and image 55/5). No perforation or abscess. Rectum normal Vascular/Lymphatic: Abdominal aorta is normal caliber with atherosclerotic calcification. There is no retroperitoneal or periportal lymphadenopathy. No pelvic lymphadenopathy. Reproductive: Post hysterectomy. Other: No free fluid. Musculoskeletal: No aggressive osseous lesion. IMPRESSION: 1. Eight cm segment of bowel wall thickening and inflammation involving the proximal sigmoid colon through a region multiple diverticula. Findings are most consistent with acute diverticulitis. Recommend follow-up imaging or colonoscopy (if patient is not current for screening) to exclude underlying mucosal lesion. 2. Small round lesion associated tail of the pancreas is relatively stable compared to prior CT scans. Favor small splenule. Further characterization could be achieved with contrast MRI. Electronically Signed   By: Suzy Bouchard M.D.   On: 06/18/2017 15:51     IMPRESSION AND PLAN:   62 year old female with past medical history of hypertension, hypothyroidism, hyperlipidemia, history of hepatitis C who presents to the hospital due to abdominal pain, bloating and nausea and noted to have acute diverticulitis.  1.  Acute diverticulitis-this is a cause of patient's worsening abdominal pain, bloating and nausea. - Patient CT scan is suggestive of sigmoid diverticulitis without any abscess formation or fluid collection. - She will be treated supportively with IV fluids, IV Zosyn, antiemetics and pain control.  Keep on a clear liquid diet and follow response.  2.  Leukocytosis-secondary to the diverticulitis.  Will follow with IV antibiotic therapy.  3.  Essential hypertension-patient's blood pressure is somewhat on the low side.  I will hold her antihypertensives for  now.  Continue IV fluids.  Follow hemodynamics.  4.  Hypothyroidism-continue Synthroid.  5.  GERD-continue Protonix.    All the records are reviewed and case discussed with ED provider. Management plans discussed with the patient, family and they are in agreement.  CODE STATUS: Full code  TOTAL TIME TAKING CARE OF THIS PATIENT: 40 minutes.    Henreitta Leber M.D on 06/18/2017 at 6:17 PM  Between 7am to 6pm - Pager - [phone]  After 6pm go to [email or web address] - password [email or web address] [facility]  [facility]  Office  [phone]  CC: Primary care physician; Glendon Axe, MD

## 2017-06-18 NOTE — ED Provider Notes (Addendum)
[facility] Emergency Department Provider Note   ____________________________________________    I have reviewed the triage vital signs and the nursing notes.   HISTORY  Chief Complaint Abdominal pain    HPI Felicia Manning is a 62 y.o. female who presents with complaints of abdominal pain.  Patient reports the pain started yesterday and has been constant since then.  It is been primarily in the lower abdomen and is moderate to severe.  Today she went to urgent care had labs drawn and was driving home when they called her and told her her white blood cell count was elevated and her x-ray was concerning for small bowel obstruction.  She does have a history of a hysterectomy.  She reports her temperature has been elevated as well for which she has been taking Tylenol.  No bowel movement since Thursday.  Positive flatus  Past Medical History:  Diagnosis Date  . Barrett's esophagus   . Dysrhythmia    stress related at work.  Marland Kitchen GERD (gastroesophageal reflux disease)   . Hepatitis C 2008  . Hyperlipidemia   . Hypertension   . Hypothyroidism   . Nephrolithiasis   . Osteoporosis   . Thyroid cancer ([place]) 2012  . Thyroid cancer [facility])     Patient Active Problem List   Diagnosis Date Noted  . Acute diverticulitis 06/18/2017  . 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 Fungal infection of toenail 06/11/2015  . 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 Barrett esophagus 05/02/2015  . Acid reflux 05/02/2015  . HCV (hepatitis C virus) 05/02/2015  . HLD (hyperlipidemia) 05/02/2015  . BP (high blood pressure) 05/02/2015  . Adult hypothyroidism 05/02/2015  . Elevated WBC count 05/02/2015  . 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[place]) 05/02/2015  . Kidney stone   . Pyelonephritis   . UTI (lower urinary tract infection) 04/08/2015  . Flank pain 04/08/2015  . Chronic hepatitis C virus infection ([place]) 03/12/2015  . Essential (primary) hypertension  03/12/2015  . Gastro-esophageal reflux disease without esophagitis 03/12/2015  . Hypothyroidism, postop 03/12/2015  . H/O malignant neoplasm of thyroid 01/29/2014  . OP (osteoporosis) 10/06/2010  . Current tobacco use 10/06/2010  . Tobacco use 10/06/2010    Past Surgical History:  Procedure Laterality Date  . ABDOMINAL HYSTERECTOMY  2006  . COLONOSCOPY WITH PROPOFOL N/A 04/25/2015   Procedure: COLONOSCOPY WITH PROPOFOL;  Surgeon: Josefine Class, MD;  Location: [facility] ENDOSCOPY;  Service: Endoscopy;  Laterality: N/A;  . CYSTOSCOPY W/ RETROGRADES Bilateral 05/19/2015   Procedure: CYSTOSCOPY WITH RETROGRADE PYELOGRAM;  Surgeon: Hollice Espy, MD;  Location: [facility] ORS;  Service: Urology;  Laterality: Bilateral;  . CYSTOSCOPY W/ URETERAL STENT PLACEMENT Right 05/26/2015   Procedure: CYSTOSCOPY WITH STENT REPLACEMENT;  Surgeon: Hollice Espy, MD;  Location: [facility] ORS;  Service: Urology;  Laterality: Right;  . CYSTOSCOPY WITH STENT PLACEMENT Right 05/19/2015   Procedure: CYSTOSCOPY WITH STENT PLACEMENT;  Surgeon: Hollice Espy, MD;  Location: [facility] ORS;  Service: Urology;  Laterality: Right;  . CYSTOSCOPY WITH STENT PLACEMENT Right 05/26/2015   Procedure: CYSTOSCOPY WITH STENT PLACEMENT;  Surgeon: Hollice Espy, MD;  Location: [facility] ORS;  Service: Urology;  Laterality: Right;  . CYSTOSCOPY WITH STENT PLACEMENT Right 06/25/2016   Procedure: CYSTOSCOPY WITH STENT PLACEMENT;  Surgeon: Nickie Retort, MD;  Location: [facility] ORS;  Service: Urology;  Laterality: Right;  . CYSTOSCOPY/URETEROSCOPY/HOLMIUM LASER/STENT PLACEMENT Left 07/23/2015   Procedure: CYSTOSCOPY/URETEROSCOPY/HOLMIUM LASER/STENT PLACEMENT/RETROGRADE PYELOGRAM;  Surgeon: Hollice Espy, MD;  Location: [facility] ORS;  Service: Urology;  Laterality: Left;  . ESOPHAGOGASTRODUODENOSCOPY (EGD) WITH PROPOFOL N/A 04/25/2015   Procedure: ESOPHAGOGASTRODUODENOSCOPY (EGD) WITH PROPOFOL;  Surgeon: Josefine Class, MD;  Location: [facility] ENDOSCOPY;   Service: Endoscopy;  Laterality: N/A;  . EYE SURGERY Bilateral 1964   lazy eye repair  . EYE SURGERY Bilateral 2001   lazy eye repair  . LITHOTRIPSY  2009  . THYROIDECTOMY  2010  . URETEROSCOPY WITH HOLMIUM LASER LITHOTRIPSY Right 05/19/2015   Procedure: URETEROSCOPY WITH HOLMIUM LASER LITHOTRIPSY;  Surgeon: Hollice Espy, MD;  Location: [facility] ORS;  Service: Urology;  Laterality: Right;  . URETEROSCOPY WITH HOLMIUM LASER LITHOTRIPSY Right 05/26/2015   Procedure: URETEROSCOPY WITH HOLMIUM LASER LITHOTRIPSY;  Surgeon: Hollice Espy, MD;  Location: [facility] ORS;  Service: Urology;  Laterality: Right;  . URETEROSCOPY WITH HOLMIUM LASER LITHOTRIPSY Right 06/25/2016   Procedure: URETEROSCOPY WITH HOLMIUM LASER LITHOTRIPSY;  Surgeon: Nickie Retort, MD;  Location: [facility] ORS;  Service: Urology;  Laterality: Right;    Prior to Admission medications   Medication Sig Start Date End Date Taking? Authorizing Provider  amLODipine (NORVASC) 10 MG tablet Take 10 mg by mouth daily.   Yes [provider]  cholecalciferol (VITAMIN D) 1000 units tablet Take 2,000 Units by mouth daily.   Yes [provider]  levothyroxine (SYNTHROID, LEVOTHROID) 150 MCG tablet Take 150 mcg by mouth daily at 10 pm.    Yes [provider]  losartan (COZAAR) 100 MG tablet Take 100 mg by mouth every morning.   Yes [provider]  metoprolol succinate (TOPROL-XL) 25 MG 24 hr tablet Take 25 mg by mouth every morning.    Yes [provider]  omeprazole (PRILOSEC) 20 MG capsule Take 20 mg by mouth every morning.   Yes [provider]     Allergies Patient has no known allergies.  Family History  Problem Relation Age of Onset  . CAD Mother   . Heart disease Mother   . Dementia Father   . Bladder Cancer Neg Hx   . Prostate cancer Neg Hx   . Kidney cancer Neg Hx     Social History Social History   Tobacco Use  . Smoking status: Current Every Day Smoker    Packs/day: 1.00     Years: 40.00    Pack years: 40.00    Types: Cigarettes  . Smokeless tobacco: Never Used  Substance Use Topics  . Alcohol use: Yes    Comment: socially  . Drug use: No    Review of Systems  Constitutional: As above Eyes: No visual changes.  ENT: No sore throat. Cardiovascular: Denies chest pain. Respiratory: Denies shortness of breath. Gastrointestinal: Abdominal pain as above, no appetite.   Genitourinary: Negative for dysuria. Musculoskeletal: Negative for back pain. Skin: Negative for rash. Neurological: Negative for headaches    ____________________________________________   PHYSICAL EXAM:  VITAL SIGNS: ED Triage Vitals  Enc Vitals Group     BP 06/18/17 1207 110/63     Pulse Rate 06/18/17 1207 90     Resp 06/18/17 1207 16     Temp 06/18/17 1207 98.6 F (37 C)     Temp Source 06/18/17 1207 Oral     SpO2 06/18/17 1207 95 %     Weight 06/18/17 1208 83.9 kg (185 lb)     Height 06/18/17 1208 1.651 m (5\' 5" )     Head Circumference --      Peak Flow --      Pain Score 06/18/17 1208 7  Pain Loc --      Pain Edu? --      Excl. in [place]? --     Constitutional: Alert and oriented. No acute distress. Pleasant and interactive Eyes: Conjunctivae are normal.   Nose: No congestion/rhinnorhea. Mouth/Throat: Mucous membranes are moist.    Cardiovascular: Normal rate, regular rhythm. Grossly normal heart sounds.  Good peripheral circulation. Respiratory: Normal respiratory effort.  No retractions. Lungs CTAB. Gastrointestinal: Moderate tenderness palpation the lower abdomen, mild distention Genitourinary: deferred Musculoskeletal:  Warm and well perfused Neurologic:  Normal speech and language. No gross focal neurologic deficits are appreciated.  Skin:  Skin is warm, dry and intact. No rash noted. Psychiatric: Mood and affect are normal. Speech and behavior are normal.  ____________________________________________   LABS (all labs ordered are listed, but only  abnormal results are displayed)  Labs Reviewed  CBC - Abnormal; Notable for the following components:      Result Value   WBC 22.9 (*)    All other components within normal limits  COMPREHENSIVE METABOLIC PANEL - Abnormal; Notable for the following components:   Chloride 100 (*)    Glucose, Bld 100 (*)    Creatinine, Ser 1.16 (*)    Calcium 8.4 (*)    GFR calc non Af Amer 50 (*)    GFR calc Af Amer 59 (*)    All other components within normal limits  CBC WITH DIFFERENTIAL/PLATELET - Abnormal; Notable for the following components:   WBC 21.7 (*)    Neutro Abs 16.6 (*)    Monocytes Absolute 1.8 (*)    All other components within normal limits  BASIC METABOLIC PANEL - Abnormal; Notable for the following components:   Potassium 3.4 (*)    Calcium 8.0 (*)    All other components within normal limits  CBC - Abnormal; Notable for the following components:   WBC 13.9 (*)    Hemoglobin 11.7 (*)    All other components within normal limits  LIPASE, BLOOD  LACTIC ACID, PLASMA  HIV ANTIBODY (ROUTINE TESTING)   ____________________________________________  EKG  None ____________________________________________  RADIOLOGY  CT abdomen pelvis ____________________________________________   PROCEDURES  Procedure(s) performed: No  Procedures   Critical Care performed: No ____________________________________________   INITIAL IMPRESSION / ASSESSMENT AND PLAN / ED COURSE  Pertinent labs & imaging results that were available during my care of the patient were reviewed by me and considered in my medical decision making (see chart for details).  Patient presents with abdominal pain primarily in the lower abdomen.  Reports of elevated white blood cell count at urgent care.  Apparently also concerning x-ray there.  Will obtain CT abdomen pelvis, repeat blood work, give IV morphine and IV Zofran and monitor carefully  Pending CT abdomen/pelvis, have asked Dr. Jimmye Norman to f/u on CT  results. Anticipate admission    ____________________________________________   FINAL CLINICAL IMPRESSION(S) / ED DIAGNOSES  Final diagnoses:  Lower abdominal pain  Diverticulitis        Note:  This document was prepared using Dragon voice recognition software and may include unintentional dictation errors.    Lavonia Drafts, MD 06/18/17 1444    Lavonia Drafts, MD 06/19/17 0930

## 2017-06-18 NOTE — ED Notes (Signed)
First Nurse Note: Pt sent to the ED by MD at [facility] in for evaluation of abdominal pain and elevated WBC.  Per Dr. Netty Starring pt had WBC of 24k this morning when blood work done at walk in. Pt also had abd x-ray done.

## 2017-06-18 NOTE — ED Notes (Signed)
Pt to ct 

## 2017-06-18 NOTE — ED Provider Notes (Addendum)
CT  IMPRESSION: 1. Eight cm segment of bowel wall thickening and inflammation involving the proximal sigmoid colon through a region multiple diverticula. Findings are most consistent with acute diverticulitis. Recommend follow-up imaging or colonoscopy (if patient is not current for screening) to exclude underlying mucosal lesion. 2. Small round lesion associated tail of the pancreas is relatively stable compared to prior CT scans. Favor small splenule. Further characterization could be achieved with contrast MRI.  Patient with acute diverticulitis on CT, I have ordered IV Zosyn.  Her pressure has been borderline low, response to fluid challenge but then drops back down.  I think would be wise to admit her, I have ordered additional fluids and I will speak with the hospitalist.   Earleen Newport, MD 06/18/17 [place]    Earleen Newport, MD 06/18/17 [phone]

## 2017-06-18 NOTE — ED Triage Notes (Signed)
Abdominal pain, fever, nausea x 1 day.  Seen through [facility] walk in clinic, WBC elevated and on Xray appeared patient has a bowel blockage.  Had blood work and Xray done through [place], WBC  23.8

## 2017-06-18 NOTE — ED Notes (Signed)
Pt presents from [facility] with abdominal pain and fever (100); elevated WBC and bowel blockage determined at [facility] before pt sent here. Pt states her last bm was Thursday, and she feels like she needs to go, but can't. Pt alert & oriented with NAD noted.

## 2017-06-18 NOTE — Consult Note (Addendum)
Pharmacy Antibiotic Note  Felicia Manning is a 62 y.o. female admitted on 06/18/2017 with diverticulitis.  Pharmacy has been consulted for Zosyn dosing.  Plan: Start Zosyn 3.375 IV EI every 8 hours.   Height: 5\' 5"  (165.1 cm) Weight: 185 lb (83.9 kg) IBW/kg (Calculated) : 57  Temp (24hrs), Avg:98.6 F (37 C), Min:98.6 F (37 C), Max:98.6 F (37 C)  Recent Labs  Lab 06/18/17 1413 06/18/17 1704  WBC 22.9* 21.7*  CREATININE 1.16*  --     Estimated Creatinine Clearance: 55.9 mL/min (A) (by C-G formula based on SCr of 1.16 mg/dL (H)).    No Known Allergies  Antimicrobials this admission: 5/4 Zosyn >>    Thank you for allowing pharmacy to be a part of this patient's care.  Pernell Dupre, PharmD, BCPS Clinical Pharmacist 06/18/2017 6:49 PM

## 2017-06-19 LAB — BASIC METABOLIC PANEL
Anion gap: 9 (ref 5–15)
BUN: 12 mg/dL (ref 6–20)
CO2: 23 mmol/L (ref 22–32)
Calcium: 8 mg/dL — ABNORMAL LOW (ref 8.9–10.3)
Chloride: 109 mmol/L (ref 101–111)
Creatinine, Ser: 0.74 mg/dL (ref 0.44–1.00)
GFR calc Af Amer: >60 mL/min (ref >60)
GFR calc non Af Amer: >60 mL/min (ref >60)
Glucose, Bld: 84 mg/dL (ref 65–99)
Potassium: 3.4 mmol/L — ABNORMAL LOW (ref 3.5–5.1)
Sodium: 141 mmol/L (ref 135–145)

## 2017-06-19 LAB — CBC
HCT: 36.2 % (ref 35.0–47.0)
Hemoglobin: 11.7 g/dL — ABNORMAL LOW (ref 12.0–16.0)
MCH: 29.7 pg (ref 26.0–34.0)
MCHC: 32.4 g/dL (ref 32.0–36.0)
MCV: 91.7 fL (ref 80.0–100.0)
Platelets: 209 10*3/uL (ref 150–440)
RBC: 3.95 MIL/uL (ref 3.80–5.20)
RDW: 14.1 % (ref 11.5–14.5)
WBC: 13.9 10*3/uL — ABNORMAL HIGH (ref 3.6–11.0)

## 2017-06-19 NOTE — Progress Notes (Signed)
No abd pain to speak of. Last BM Thurs 5/2. Feels like she may need to have BM soon. Up in chair.

## 2017-06-19 NOTE — Progress Notes (Addendum)
[place] at [facility]                                                                                                                                                                                  Patient Demographics   Felicia Manning, is a 62 y.o. female, DOB - 1955/04/13, KWI:[id number]  Admit date - 06/18/2017   Admitting Physician Henreitta Leber, MD  Outpatient Primary MD for the patient is Glendon Axe, MD   LOS - 1  Subjective:  Patient admitted with acute diverticulitis states that abdominal pain much improved Complains of constipation  Review of Systems:   CONSTITUTIONAL: No documented fever. No fatigue, weakness. No weight gain, no weight loss.  EYES: No blurry or double vision.  ENT: No tinnitus. No postnasal drip. No redness of the oropharynx.  RESPIRATORY: No cough, no wheeze, no hemoptysis. No dyspnea.  CARDIOVASCULAR: No chest pain. No orthopnea. No palpitations. No syncope.  GASTROINTESTINAL: No nausea, no vomiting or diarrhea.  Positive abdominal pain. No melena or hematochezia.  GENITOURINARY: No dysuria or hematuria.  ENDOCRINE: No polyuria or nocturia. No heat or cold intolerance.  HEMATOLOGY: No anemia. No bruising. No bleeding.  INTEGUMENTARY: No rashes. No lesions.  MUSCULOSKELETAL: No arthritis. No swelling. No gout.  NEUROLOGIC: No numbness, tingling, or ataxia. No seizure-type activity.  PSYCHIATRIC: No anxiety. No insomnia. No ADD.    Vitals:   Vitals:   06/18/17 1730 06/18/17 1830 06/18/17 1946 06/19/17 0512  BP: (!) 91/55 106/63 (!) 97/49 109/63  Pulse: 68 70 68 72  Resp:   20 20  Temp:   97.7 F (36.5 C) 98.2 F (36.8 C)  TempSrc:   Oral Oral  SpO2: 92% 91% 95% 92%  Weight:      Height:        Wt Readings from Last 3 Encounters:  06/18/17 83.9 kg (185 lb)  07/28/16 83.9 kg (185 lb)  06/23/16 83.9 kg (185 lb)     Intake/Output Summary (Last 24 hours) at 06/19/2017 1233 Last data filed at 06/19/2017  1005 Gross per 24 hour  Intake 1683.33 ml  Output 1650 ml  Net 33.33 ml    Physical Exam:   GENERAL: Pleasant-appearing in no apparent distress.  HEAD, EYES, EARS, NOSE AND THROAT: Atraumatic, normocephalic. Extraocular muscles are intact. Pupils equal and reactive to light. Sclerae anicteric. No conjunctival injection. No oro-pharyngeal erythema.  NECK: Supple. There is no jugular venous distention. No bruits, no lymphadenopathy, no thyromegaly.  HEART: Regular rate and rhythm,. No murmurs, no rubs, no clicks.  LUNGS: Clear to auscultation bilaterally. No rales or rhonchi. No wheezes.  ABDOMEN: Soft, flat, nontender, nondistended. Has good  bowel sounds. No hepatosplenomegaly appreciated.  EXTREMITIES: No evidence of any cyanosis, clubbing, or peripheral edema.  +2 pedal and radial pulses bilaterally.  NEUROLOGIC: The patient is alert, awake, and oriented x3 with no focal motor or sensory deficits appreciated bilaterally.  SKIN: Moist and warm with no rashes appreciated.  Psych: Not anxious, depressed LN: No inguinal LN enlargement    Antibiotics   Anti-infectives (From admission, onward)   Start     Dose/Rate Route Frequency Ordered Stop   06/18/17 2200  piperacillin-tazobactam (ZOSYN) IVPB 3.375 g     3.375 g 12.5 mL/hr over 240 Minutes Intravenous Every 8 hours 06/18/17 1848     06/18/17 1600  piperacillin-tazobactam (ZOSYN) IVPB 3.375 g     3.375 g 100 mL/hr over 30 Minutes Intravenous  Once 06/18/17 1555 06/18/17 1625      Medications   Scheduled Meds: . cholecalciferol  2,000 Units Oral Daily  . enoxaparin (LOVENOX) injection  40 mg Subcutaneous Q24H  . levothyroxine  150 mcg Oral Q2200  . nicotine  21 mg Transdermal Daily  . pantoprazole  40 mg Oral Daily   Continuous Infusions: . sodium chloride 100 mL/hr at 06/19/17 0612  . piperacillin-tazobactam (ZOSYN)  IV Stopped (06/19/17 1030)   PRN Meds:.acetaminophen **OR** acetaminophen, ondansetron **OR** ondansetron  (ZOFRAN) IV   Data Review:   Micro Results No results found for this or any previous visit (from the past 240 hour(s)).  Radiology Reports Ct Abdomen Pelvis W Contrast  Result Date: 06/18/2017 CLINICAL DATA:  Abdominopelvic pain for 3 days. No bowel movement since Thursday. EXAM: CT ABDOMEN AND PELVIS WITH CONTRAST TECHNIQUE: Multidetector CT imaging of the abdomen and pelvis was performed using the standard protocol following bolus administration of intravenous contrast. CONTRAST:  25mL ISOVUE-370 IOPAMIDOL (ISOVUE-370) INJECTION 76% COMPARISON:  CT 06/17/2016 FINDINGS: Lower chest: Lung bases are clear. Hepatobiliary: Hypodense lesion in the RIGHT hepatic lobe has simple fluid attenuation consistent with simple cyst no biliary duct dilatation. Normal gallbladder Pancreas: Pancreas is normal. No ductal dilatation. No pancreatic inflammation. Rounded lesion associated with the tail the pancreas measures 19 mm (image 25/2) which compared to 17 mm on CT 04/08/2015 for no significant change in size. This is favored a splenule. Spleen: Normal spleen Adrenals/urinary tract: Adrenal glands are normal. Bilateral simple fluid attenuation renal cysts lesions. Bilateral nonobstructing renal calculi. Ureters normal. Bladder normal Stomach/Bowel: Stomach, duodenum, pending send cecum normal. Ascending and transverse colon normal. There are multiple diverticula along the proximal sigmoid colon. There is a 8 cm segment of bowel wall thickening of proximal sigmoid colon through this region of diverticulosis. There is inflammation and stranding in the sigmoid mesocolon adjacent to the bowel wall thickening (image 76/2 and image 55/5). No perforation or abscess. Rectum normal Vascular/Lymphatic: Abdominal aorta is normal caliber with atherosclerotic calcification. There is no retroperitoneal or periportal lymphadenopathy. No pelvic lymphadenopathy. Reproductive: Post hysterectomy. Other: No free fluid. Musculoskeletal: No  aggressive osseous lesion. IMPRESSION: 1. Eight cm segment of bowel wall thickening and inflammation involving the proximal sigmoid colon through a region multiple diverticula. Findings are most consistent with acute diverticulitis. Recommend follow-up imaging or colonoscopy (if patient is not current for screening) to exclude underlying mucosal lesion. 2. Small round lesion associated tail of the pancreas is relatively stable compared to prior CT scans. Favor small splenule. Further characterization could be achieved with contrast MRI. Electronically Signed   By: Suzy Bouchard M.D.   On: 06/18/2017 15:51     CBC Recent Labs  Lab  06/18/17 1413 06/18/17 1704 06/19/17 0516  WBC 22.9* 21.7* 13.9*  HGB 13.5 12.4 11.7*  HCT 40.5 37.8 36.2  PLT 238 230 209  MCV 91.5 92.0 91.7  MCH 30.6 30.3 29.7  MCHC 33.4 33.0 32.4  RDW 14.3 14.4 14.1  LYMPHSABS  --  3.0  --   MONOABS  --  1.8*  --   EOSABS  --  0.1  --   BASOSABS  --  0.1  --     Chemistries  Recent Labs  Lab 06/18/17 1413 06/19/17 0516  NA 136 141  K 3.7 3.4*  CL 100* 109  CO2 27 23  GLUCOSE 100* 84  BUN 15 12  CREATININE 1.16* 0.74  CALCIUM 8.4* 8.0*  AST 17  --   ALT 15  --   ALKPHOS 85  --   BILITOT 1.1  --    ------------------------------------------------------------------------------------------------------------------ estimated creatinine clearance is 81 mL/min (by C-G formula based on SCr of 0.74 mg/dL). ------------------------------------------------------------------------------------------------------------------ No results for input(s): HGBA1C in the last 72 hours. ------------------------------------------------------------------------------------------------------------------ No results for input(s): CHOL, HDL, LDLCALC, TRIG, CHOLHDL, LDLDIRECT in the last 72 hours. ------------------------------------------------------------------------------------------------------------------ No results for input(s):  TSH, T4TOTAL, T3FREE, THYROIDAB in the last 72 hours.  Invalid input(s): FREET3 ------------------------------------------------------------------------------------------------------------------ No results for input(s): VITAMINB12, FOLATE, FERRITIN, TIBC, IRON, RETICCTPCT in the last 72 hours.  Coagulation profile No results for input(s): INR, PROTIME in the last 168 hours.  No results for input(s): DDIMER in the last 72 hours.  Cardiac Enzymes No results for input(s): CKMB, TROPONINI, MYOGLOBIN in the last 168 hours.  Invalid input(s): CK ------------------------------------------------------------------------------------------------------------------ Invalid input(s): POCBNP    Assessment & Plan   62 year old female with past medical history of hypertension, hypothyroidism, hyperlipidemia, history of hepatitis C who presents to the hospital due to abdominal pain, bloating and nausea and noted to have acute diverticulitis.  1.  Acute diverticulitis- continue IV Zosyn, advance diet to full liquid  2.  Leukocytosis-secondary to the diverticulitis.    Improved with IV antibiotic  3.  Essential hypertension-patient's blood pressure is somewhat on the low side.  Continue to hold blood pressure medications  4.  Hypothyroidism-continue Synthroid.  5.  GERD-continue Protonix.   6.  Nicotine abuse smoking cessation provided I strongly recommended patient stop smoking 4 minutes spent She is already started on nicotine replacement      Code Status Orders  (From admission, onward)        Start     Ordered   06/18/17 1950  Full code  Continuous     06/18/17 1950    Code Status History    Date Active Date Inactive Code Status Order ID Comments User Context   04/08/2015 1116 04/10/2015 1435 Full Code [id number]  Hillary Bow, MD ED           Consults none  DVT Prophylaxis  Lovenox  Lab Results  Component Value Date   PLT 209 06/19/2017     Time Spent in  minutes 35 minutes Greater than 50% of time spent in care coordination and counseling patient regarding the condition and plan of care.   Dustin Flock M.D on 06/19/2017 at 12:33 PM  Between 7am to 6pm - Pager - [phone]  After 6pm go to [email or web address] - Proofreader  [employer]   Office  [phone]

## 2017-06-20 LAB — HIV ANTIBODY (ROUTINE TESTING W REFLEX): HIV Screen 4th Generation wRfx: NONREACTIVE

## 2017-06-20 MED ORDER — AMOXICILLIN-POT CLAVULANATE 875-125 MG PO TABS: 1.0000 | ORAL_TABLET | Freq: Two times a day (BID) | ORAL | 0 refills | Status: AC

## 2017-06-20 NOTE — Progress Notes (Signed)
Discharge instructions reviewed with the patient. IV removed.  Patient being sent out via wheelchair to her car.  She is driving herself home.  MD aware.  Patient has not had any narcotics or meds that would impair her driving

## 2017-06-20 NOTE — Discharge Summary (Signed)
[place] at [facility], 62 y.o., DOB [birth year], MRN [id number]. Admission date: 06/18/2017 Discharge Date 06/20/2017 Primary MD Glendon Axe, MD Admitting Physician Henreitta Leber, MD  Admission Diagnosis  Diverticulitis [K57.92] Lower abdominal pain [R10.30]  Discharge Diagnosis   Active Problems:   Acute diverticulitis Leukocytosis due to #1 Hypertension Hypothyroid GERD Nicotine abuse       Hospital Course  62 year old female with past medical history of hypertension, hypothyroidism, hyperlipidemia, history of hepatitis C who presents to the hospital due to abdominal pain, bloating and nausea and noted to have acute diverticulitis.  Patient was admitted and started on treatment with IV antibiotics.  Her symptoms started to significantly improve with antibiotics.  Her abdominal pain is resolved.  Her leukocytosis is improved as well.  Patient will be discharged on oral antibiotics.    I recommended patient not eat foods containing seeds, also take fiber supplements         Consults  None  Significant Tests:  See full reports for all details     Ct Abdomen Pelvis W Contrast  Result Date: 06/18/2017 CLINICAL DATA:  Abdominopelvic pain for 3 days. No bowel movement since Thursday. EXAM: CT ABDOMEN AND PELVIS WITH CONTRAST TECHNIQUE: Multidetector CT imaging of the abdomen and pelvis was performed using the standard protocol following bolus administration of intravenous contrast. CONTRAST:  18mL ISOVUE-370 IOPAMIDOL (ISOVUE-370) INJECTION 76% COMPARISON:  CT 06/17/2016 FINDINGS: Lower chest: Lung bases are clear. Hepatobiliary: Hypodense lesion in the RIGHT hepatic lobe has simple fluid attenuation consistent with simple cyst no biliary duct dilatation. Normal gallbladder Pancreas: Pancreas is normal. No ductal dilatation. No pancreatic inflammation. Rounded lesion associated with the tail the pancreas measures 19 mm (image 25/2)  which compared to 17 mm on CT 04/08/2015 for no significant change in size. This is favored a splenule. Spleen: Normal spleen Adrenals/urinary tract: Adrenal glands are normal. Bilateral simple fluid attenuation renal cysts lesions. Bilateral nonobstructing renal calculi. Ureters normal. Bladder normal Stomach/Bowel: Stomach, duodenum, pending send cecum normal. Ascending and transverse colon normal. There are multiple diverticula along the proximal sigmoid colon. There is a 8 cm segment of bowel wall thickening of proximal sigmoid colon through this region of diverticulosis. There is inflammation and stranding in the sigmoid mesocolon adjacent to the bowel wall thickening (image 76/2 and image 55/5). No perforation or abscess. Rectum normal Vascular/Lymphatic: Abdominal aorta is normal caliber with atherosclerotic calcification. There is no retroperitoneal or periportal lymphadenopathy. No pelvic lymphadenopathy. Reproductive: Post hysterectomy. Other: No free fluid. Musculoskeletal: No aggressive osseous lesion. IMPRESSION: 1. Eight cm segment of bowel wall thickening and inflammation involving the proximal sigmoid colon through a region multiple diverticula. Findings are most consistent with acute diverticulitis. Recommend follow-up imaging or colonoscopy (if patient is not current for screening) to exclude underlying mucosal lesion. 2. Small round lesion associated tail of the pancreas is relatively stable compared to prior CT scans. Favor small splenule. Further characterization could be achieved with contrast MRI. Electronically Signed   By: Suzy Bouchard M.D.   On: 06/18/2017 15:51       Today   Subjective:   Neoma Laming Veiga feeling better denies any abdominal pain Objective:   Blood pressure 136/75, pulse 71, temperature 98 F (36.7 C), temperature source Oral, resp. rate 16, height 5\' 5"  (1.651 m), weight 83.9 kg (185 lb), SpO2 97 %.  .  Intake/Output Summary (Last 24 hours) at 06/20/2017  1447 Last data filed at 06/20/2017 0917 Gross per  24 hour  Intake 3406.33 ml  Output 400 ml  Net 3006.33 ml    Exam VITAL SIGNS: Blood pressure 136/75, pulse 71, temperature 98 F (36.7 C), temperature source Oral, resp. rate 16, height 5\' 5"  (1.651 m), weight 83.9 kg (185 lb), SpO2 97 %.  GENERAL:  62 y.o.-year-old patient lying in the bed with no acute distress.  EYES: Pupils equal, round, reactive to light and accommodation. No scleral icterus. Extraocular muscles intact.  HEENT: Head atraumatic, normocephalic. Oropharynx and nasopharynx clear.  NECK:  Supple, no jugular venous distention. No thyroid enlargement, no tenderness.  LUNGS: Normal breath sounds bilaterally, no wheezing, rales,rhonchi or crepitation. No use of accessory muscles of respiration.  CARDIOVASCULAR: S1, S2 normal. No murmurs, rubs, or gallops.  ABDOMEN: Soft, nontender, nondistended. Bowel sounds present. No organomegaly or mass.  EXTREMITIES: No pedal edema, cyanosis, or clubbing.  NEUROLOGIC: Cranial nerves II through XII are intact. Muscle strength 5/5 in all extremities. Sensation intact. Gait not checked.  PSYCHIATRIC: The patient is alert and oriented x 3.  SKIN: No obvious rash, lesion, or ulcer.   Data Review     CBC w Diff:  Lab Results  Component Value Date   WBC 13.9 (H) 06/19/2017   HGB 11.7 (L) 06/19/2017   HCT 36.2 06/19/2017   PLT 209 06/19/2017   LYMPHOPCT 14 06/18/2017   MONOPCT 8 06/18/2017   EOSPCT 0 06/18/2017   BASOPCT 1 06/18/2017   CMP:  Lab Results  Component Value Date   NA 141 06/19/2017   K 3.4 (L) 06/19/2017   CL 109 06/19/2017   CO2 23 06/19/2017   BUN 12 06/19/2017   CREATININE 0.74 06/19/2017   PROT 7.1 06/18/2017   ALBUMIN 3.7 06/18/2017   BILITOT 1.1 06/18/2017   ALKPHOS 85 06/18/2017   AST 17 06/18/2017   ALT 15 06/18/2017  .  Micro Results No results found for this or any previous visit (from the past 240 hour(s)).      Code Status Orders  (From  admission, onward)        Start     Ordered   06/18/17 1950  Full code  Continuous     06/18/17 1950    Code Status History    Date Active Date Inactive Code Status Order ID Comments User Context   04/08/2015 1116 04/10/2015 1435 Full Code [id number]  Hillary Bow, MD ED          Follow-up Information    Glendon Axe, MD Follow up.   Specialty:  Internal Medicine Why:  Dr. Candiss Norse, Monday, May 13 at 11:15 a.m., [phone] Contact information: [place] [facility] [place] [address] [phone]           Discharge Medications   Allergies as of 06/20/2017   No Known Allergies     Medication List    TAKE these medications   amLODipine 10 MG tablet Commonly known as:  NORVASC Take 10 mg by mouth daily.   amoxicillin-clavulanate 875-125 MG tablet Commonly known as:  AUGMENTIN Take 1 tablet by mouth 2 (two) times daily for 7 days.   cholecalciferol 1000 units tablet Commonly known as:  VITAMIN D Take 2,000 Units by mouth daily.   levothyroxine 150 MCG tablet Commonly known as:  SYNTHROID, LEVOTHROID Take 150 mcg by mouth daily at 10 pm.   losartan 100 MG tablet Commonly known as:  COZAAR Take 100 mg by mouth every morning.   metoprolol succinate 25 MG  24 hr tablet Commonly known as:  TOPROL-XL Take 25 mg by mouth every morning.   omeprazole 20 MG capsule Commonly known as:  PRILOSEC Take 20 mg by mouth every morning.          Total Time in preparing paper work, data evaluation and todays exam - 12 minutes  Dustin Flock M.D on 06/20/2017 at 2:47 PM [place]  [phone]

## 2017-07-22 ENCOUNTER — Ambulatory Visit: Payer: 59 | Admitting: Urology

## 2017-08-12 ENCOUNTER — Inpatient Hospital Stay
Admission: EM | Admit: 2017-08-12 | Discharge: 2017-08-15 | DRG: 871 | Disposition: A | Discharge status: Home / Self Care | Payer: 59 | Attending: Internal Medicine | Admitting: Internal Medicine

## 2017-08-12 DIAGNOSIS — R197 Diarrhea, unspecified: Secondary | ICD-10-CM

## 2017-08-12 DIAGNOSIS — Z23 Encounter for immunization: Secondary | ICD-10-CM

## 2017-08-12 DIAGNOSIS — E785 Hyperlipidemia, unspecified: Secondary | ICD-10-CM | POA: Diagnosis present

## 2017-08-12 DIAGNOSIS — E872 Acidosis: Secondary | ICD-10-CM | POA: Diagnosis present

## 2017-08-12 DIAGNOSIS — N12 Tubulo-interstitial nephritis, not specified as acute or chronic: Secondary | ICD-10-CM | POA: Diagnosis not present

## 2017-08-12 DIAGNOSIS — R32 Unspecified urinary incontinence: Secondary | ICD-10-CM | POA: Diagnosis present

## 2017-08-12 DIAGNOSIS — Z79899 Other long term (current) drug therapy: Secondary | ICD-10-CM

## 2017-08-12 DIAGNOSIS — B1920 Unspecified viral hepatitis C without hepatic coma: Secondary | ICD-10-CM | POA: Diagnosis present

## 2017-08-12 DIAGNOSIS — A419 Sepsis, unspecified organism: Secondary | ICD-10-CM | POA: Diagnosis present

## 2017-08-12 DIAGNOSIS — R112 Nausea with vomiting, unspecified: Secondary | ICD-10-CM

## 2017-08-12 DIAGNOSIS — G9341 Metabolic encephalopathy: Secondary | ICD-10-CM | POA: Diagnosis present

## 2017-08-12 DIAGNOSIS — I10 Essential (primary) hypertension: Secondary | ICD-10-CM | POA: Diagnosis present

## 2017-08-12 DIAGNOSIS — A4151 Sepsis due to Escherichia coli [E. coli]: Principal | ICD-10-CM | POA: Diagnosis present

## 2017-08-12 DIAGNOSIS — M810 Age-related osteoporosis without current pathological fracture: Secondary | ICD-10-CM | POA: Diagnosis present

## 2017-08-12 DIAGNOSIS — K219 Gastro-esophageal reflux disease without esophagitis: Secondary | ICD-10-CM | POA: Diagnosis present

## 2017-08-12 DIAGNOSIS — R6521 Severe sepsis with septic shock: Secondary | ICD-10-CM | POA: Diagnosis present

## 2017-08-12 DIAGNOSIS — R159 Full incontinence of feces: Secondary | ICD-10-CM | POA: Diagnosis present

## 2017-08-12 DIAGNOSIS — J9600 Acute respiratory failure, unspecified whether with hypoxia or hypercapnia: Secondary | ICD-10-CM

## 2017-08-12 DIAGNOSIS — Z85850 Personal history of malignant neoplasm of thyroid: Secondary | ICD-10-CM

## 2017-08-12 DIAGNOSIS — Z87442 Personal history of urinary calculi: Secondary | ICD-10-CM

## 2017-08-12 DIAGNOSIS — E890 Postprocedural hypothyroidism: Secondary | ICD-10-CM | POA: Diagnosis present

## 2017-08-12 DIAGNOSIS — Z87440 Personal history of urinary (tract) infections: Secondary | ICD-10-CM

## 2017-08-12 DIAGNOSIS — F17210 Nicotine dependence, cigarettes, uncomplicated: Secondary | ICD-10-CM | POA: Diagnosis present

## 2017-08-12 MED ORDER — SODIUM CHLORIDE 0.9 % IV BOLUS
1000.0000 mL | Freq: Once | INTRAVENOUS | Status: AC
  Administered 2017-08-13: 1000 mL via INTRAVENOUS

## 2017-08-12 NOTE — ED Triage Notes (Addendum)
Patient coming [facility] for N/V/D. Patient denies abdominal pain. Patient was dx with diverticulitis and kidney infection on 07/16/17. Patient reports she has completed antibiotics. Patient incontinent of urine and stool at arrival. Patient reports she has issues with urinary incontinence at baseline - denies past hx fecal incontinence.  Patient was given 200 mL nacl, and 4 mg Zofran IV in in transport

## 2017-08-12 NOTE — ED Notes (Signed)
Patient cleaned, and changed into clean, dry gown.

## 2017-08-12 NOTE — ED Notes (Signed)
Patient reports nausea has resolved since EMS zofran administration

## 2017-08-13 ENCOUNTER — Inpatient Hospital Stay: Payer: 59

## 2017-08-13 ENCOUNTER — Other Ambulatory Visit: Payer: Self-pay

## 2017-08-13 DIAGNOSIS — R6521 Severe sepsis with septic shock: Secondary | ICD-10-CM | POA: Diagnosis present

## 2017-08-13 DIAGNOSIS — M810 Age-related osteoporosis without current pathological fracture: Secondary | ICD-10-CM | POA: Diagnosis present

## 2017-08-13 DIAGNOSIS — Z87440 Personal history of urinary (tract) infections: Secondary | ICD-10-CM | POA: Diagnosis not present

## 2017-08-13 DIAGNOSIS — E785 Hyperlipidemia, unspecified: Secondary | ICD-10-CM | POA: Diagnosis present

## 2017-08-13 DIAGNOSIS — A419 Sepsis, unspecified organism: Secondary | ICD-10-CM

## 2017-08-13 DIAGNOSIS — Z23 Encounter for immunization: Secondary | ICD-10-CM | POA: Diagnosis not present

## 2017-08-13 DIAGNOSIS — Z87442 Personal history of urinary calculi: Secondary | ICD-10-CM | POA: Diagnosis not present

## 2017-08-13 DIAGNOSIS — Z85850 Personal history of malignant neoplasm of thyroid: Secondary | ICD-10-CM | POA: Diagnosis not present

## 2017-08-13 DIAGNOSIS — F17210 Nicotine dependence, cigarettes, uncomplicated: Secondary | ICD-10-CM | POA: Diagnosis present

## 2017-08-13 DIAGNOSIS — N200 Calculus of kidney: Secondary | ICD-10-CM | POA: Diagnosis not present

## 2017-08-13 DIAGNOSIS — N12 Tubulo-interstitial nephritis, not specified as acute or chronic: Secondary | ICD-10-CM | POA: Diagnosis not present

## 2017-08-13 DIAGNOSIS — E890 Postprocedural hypothyroidism: Secondary | ICD-10-CM | POA: Diagnosis present

## 2017-08-13 DIAGNOSIS — B1920 Unspecified viral hepatitis C without hepatic coma: Secondary | ICD-10-CM | POA: Diagnosis present

## 2017-08-13 DIAGNOSIS — G9341 Metabolic encephalopathy: Secondary | ICD-10-CM | POA: Diagnosis present

## 2017-08-13 DIAGNOSIS — K219 Gastro-esophageal reflux disease without esophagitis: Secondary | ICD-10-CM | POA: Diagnosis present

## 2017-08-13 DIAGNOSIS — I10 Essential (primary) hypertension: Secondary | ICD-10-CM | POA: Diagnosis present

## 2017-08-13 DIAGNOSIS — E872 Acidosis: Secondary | ICD-10-CM | POA: Diagnosis present

## 2017-08-13 DIAGNOSIS — A4151 Sepsis due to Escherichia coli [E. coli]: Secondary | ICD-10-CM | POA: Diagnosis present

## 2017-08-13 DIAGNOSIS — Z79899 Other long term (current) drug therapy: Secondary | ICD-10-CM | POA: Diagnosis not present

## 2017-08-13 LAB — COMPREHENSIVE METABOLIC PANEL
ALT: 20 U/L (ref 0–44)
AST: 34 U/L (ref 15–41)
Albumin: 3.4 g/dL — ABNORMAL LOW (ref 3.5–5.0)
Alkaline Phosphatase: 80 U/L (ref 38–126)
Anion gap: 11 (ref 5–15)
BUN: 15 mg/dL (ref 6–20)
CO2: 20 mmol/L — ABNORMAL LOW (ref 22–32)
Calcium: 8.2 mg/dL — ABNORMAL LOW (ref 8.9–10.3)
Chloride: 106 mmol/L (ref 98–111)
Creatinine, Ser: 1.06 mg/dL — ABNORMAL HIGH (ref 0.44–1.00)
GFR calc Af Amer: >60 mL/min (ref >60)
GFR calc non Af Amer: 56 mL/min — ABNORMAL LOW (ref >60)
Glucose, Bld: 154 mg/dL — ABNORMAL HIGH (ref 70–99)
Potassium: 3.2 mmol/L — ABNORMAL LOW (ref 3.5–5.1)
Sodium: 137 mmol/L (ref 135–145)
Total Bilirubin: 0.7 mg/dL (ref 0.3–1.2)
Total Protein: 6.7 g/dL (ref 6.5–8.1)

## 2017-08-13 LAB — CBC
HCT: 40.9 % (ref 35.0–47.0)
Hemoglobin: 13.4 g/dL (ref 12.0–16.0)
MCH: 30 pg (ref 26.0–34.0)
MCHC: 32.8 g/dL (ref 32.0–36.0)
MCV: 91.4 fL (ref 80.0–100.0)
Platelets: 196 10*3/uL (ref 150–440)
RBC: 4.48 MIL/uL (ref 3.80–5.20)
RDW: 14.9 % — ABNORMAL HIGH (ref 11.5–14.5)
WBC: 21.5 10*3/uL — ABNORMAL HIGH (ref 3.6–11.0)

## 2017-08-13 LAB — URINALYSIS, COMPLETE (UACMP) WITH MICROSCOPIC
Bilirubin Urine: NEGATIVE
Glucose, UA: NEGATIVE mg/dL
Ketones, ur: NEGATIVE mg/dL
Nitrite: POSITIVE — AB
Protein, ur: 100 mg/dL — AB
Specific Gravity, Urine: 1.014 (ref 1.005–1.030)
WBC, UA: >50 WBC/hpf — ABNORMAL HIGH (ref 0–5)
pH: 5 (ref 5.0–8.0)

## 2017-08-13 LAB — TSH: TSH: 2.274 u[IU]/mL (ref 0.350–4.500)

## 2017-08-13 LAB — LIPASE, BLOOD: Lipase: 28 U/L (ref 11–51)

## 2017-08-13 LAB — MRSA PCR SCREENING: MRSA by PCR: NEGATIVE

## 2017-08-13 LAB — MAGNESIUM
Magnesium: 1.1 mg/dL — ABNORMAL LOW (ref 1.7–2.4)
Magnesium: 2.3 mg/dL (ref 1.7–2.4)

## 2017-08-13 LAB — BASIC METABOLIC PANEL
Anion gap: 6 (ref 5–15)
BUN: 13 mg/dL (ref 6–20)
CO2: 19 mmol/L — ABNORMAL LOW (ref 22–32)
Calcium: 7 mg/dL — ABNORMAL LOW (ref 8.9–10.3)
Chloride: 112 mmol/L — ABNORMAL HIGH (ref 98–111)
Creatinine, Ser: 0.95 mg/dL (ref 0.44–1.00)
GFR calc Af Amer: >60 mL/min (ref >60)
GFR calc non Af Amer: >60 mL/min (ref >60)
Glucose, Bld: 163 mg/dL — ABNORMAL HIGH (ref 70–99)
Potassium: 2.8 mmol/L — ABNORMAL LOW (ref 3.5–5.1)
Sodium: 137 mmol/L (ref 135–145)

## 2017-08-13 LAB — PROCALCITONIN: Procalcitonin: 44.01 ng/mL

## 2017-08-13 LAB — PHOSPHORUS: Phosphorus: <1 mg/dL — CL (ref 2.5–4.6)

## 2017-08-13 LAB — TROPONIN I: Troponin I: 0.05 ng/mL (ref <0.03)

## 2017-08-13 LAB — GLUCOSE, CAPILLARY: Glucose-Capillary: 158 mg/dL — ABNORMAL HIGH (ref 70–99)

## 2017-08-13 LAB — LACTIC ACID, PLASMA
Lactic Acid, Venous: 3.5 mmol/L (ref 0.5–1.9)
Lactic Acid, Venous: 1.3 mmol/L (ref 0.5–1.9)

## 2017-08-13 LAB — POTASSIUM: Potassium: 4.1 mmol/L (ref 3.5–5.1)

## 2017-08-13 MED ORDER — POTASSIUM CHLORIDE IN NACL 40-0.9 MEQ/L-% IV SOLN
INTRAVENOUS | Status: DC
  Filled 2017-08-13 (×2): qty 1000

## 2017-08-13 MED ORDER — SODIUM CHLORIDE 0.9 % IV SOLN: INTRAVENOUS | Status: DC

## 2017-08-13 MED ORDER — ONDANSETRON HCL 4 MG PO TABS: 4.0000 mg | ORAL_TABLET | Freq: Four times a day (QID) | ORAL | Status: DC | PRN

## 2017-08-13 MED ORDER — MAGNESIUM SULFATE 2 GM/50ML IV SOLN
2.0000 g | Freq: Once | INTRAVENOUS | Status: AC
  Administered 2017-08-13: 2 g via INTRAVENOUS
  Filled 2017-08-13: qty 50

## 2017-08-13 MED ORDER — ACETAMINOPHEN 325 MG PO TABS
650.0000 mg | ORAL_TABLET | Freq: Four times a day (QID) | ORAL | Status: DC | PRN
  Administered 2017-08-13 – 2017-08-14 (×3): 650 mg via ORAL
  Filled 2017-08-13 (×3): qty 2

## 2017-08-13 MED ORDER — SODIUM CHLORIDE 0.9 % IV BOLUS
1000.0000 mL | Freq: Once | INTRAVENOUS | Status: AC
  Administered 2017-08-13: 1000 mL via INTRAVENOUS

## 2017-08-13 MED ORDER — POTASSIUM PHOSPHATES 15 MMOLE/5ML IV SOLN
30.0000 mmol | Freq: Four times a day (QID) | INTRAVENOUS | Status: AC
  Administered 2017-08-13 (×2): 30 mmol via INTRAVENOUS
  Filled 2017-08-13 (×2): qty 10

## 2017-08-13 MED ORDER — ACETAMINOPHEN 650 MG RE SUPP: 650.0000 mg | Freq: Four times a day (QID) | RECTAL | Status: DC | PRN

## 2017-08-13 MED ORDER — METRONIDAZOLE IN NACL 5-0.79 MG/ML-% IV SOLN: 500.0000 mg | Freq: Three times a day (TID) | INTRAVENOUS | Status: DC

## 2017-08-13 MED ORDER — VANCOMYCIN HCL IN DEXTROSE 1-5 GM/200ML-% IV SOLN
1000.0000 mg | Freq: Once | INTRAVENOUS | Status: DC
  Filled 2017-08-13: qty 200

## 2017-08-13 MED ORDER — DOCUSATE SODIUM 100 MG PO CAPS
100.0000 mg | ORAL_CAPSULE | Freq: Two times a day (BID) | ORAL | Status: DC
  Filled 2017-08-13 (×4): qty 1

## 2017-08-13 MED ORDER — HEPARIN SODIUM (PORCINE) 5000 UNIT/ML IJ SOLN
5000.0000 [IU] | Freq: Three times a day (TID) | INTRAMUSCULAR | Status: DC
  Administered 2017-08-13 – 2017-08-15 (×6): 5000 [IU] via SUBCUTANEOUS
  Filled 2017-08-13 (×6): qty 1

## 2017-08-13 MED ORDER — SODIUM CHLORIDE 0.9 % IV SOLN
1.0000 g | INTRAVENOUS | Status: DC
  Administered 2017-08-14: 1 g via INTRAVENOUS
  Filled 2017-08-13: qty 10
  Filled 2017-08-13: qty 1

## 2017-08-13 MED ORDER — K PHOS MONO-SOD PHOS DI & MONO 155-852-130 MG PO TABS
500.0000 mg | ORAL_TABLET | Freq: Two times a day (BID) | ORAL | Status: AC
  Administered 2017-08-13 (×2): 500 mg via ORAL
  Filled 2017-08-13 (×2): qty 2

## 2017-08-13 MED ORDER — LEVOTHYROXINE SODIUM 50 MCG PO TABS
150.0000 ug | ORAL_TABLET | Freq: Every day | ORAL | Status: DC
  Administered 2017-08-13 – 2017-08-14 (×2): 150 ug via ORAL
  Filled 2017-08-13 (×2): qty 1

## 2017-08-13 MED ORDER — ONDANSETRON HCL 4 MG/2ML IJ SOLN
4.0000 mg | Freq: Four times a day (QID) | INTRAMUSCULAR | Status: DC | PRN
  Administered 2017-08-15: 4 mg via INTRAVENOUS
  Filled 2017-08-13: qty 2

## 2017-08-13 MED ORDER — POTASSIUM CHLORIDE IN NACL 40-0.9 MEQ/L-% IV SOLN
INTRAVENOUS | Status: DC
  Administered 2017-08-13 (×3): 125 mL/h via INTRAVENOUS
  Filled 2017-08-13 (×13): qty 1000

## 2017-08-13 MED ORDER — METRONIDAZOLE IN NACL 5-0.79 MG/ML-% IV SOLN
500.0000 mg | Freq: Three times a day (TID) | INTRAVENOUS | Status: DC
  Administered 2017-08-13 – 2017-08-14 (×4): 500 mg via INTRAVENOUS
  Filled 2017-08-13 (×6): qty 100

## 2017-08-13 MED ORDER — SODIUM CHLORIDE 0.9 % IV SOLN
1.0000 g | Freq: Once | INTRAVENOUS | Status: AC
  Administered 2017-08-13: 1 g via INTRAVENOUS
  Filled 2017-08-13: qty 10

## 2017-08-13 MED ORDER — VANCOMYCIN HCL IN DEXTROSE 1-5 GM/200ML-% IV SOLN
1000.0000 mg | INTRAVENOUS | Status: DC
  Administered 2017-08-13 – 2017-08-14 (×2): 1000 mg via INTRAVENOUS
  Filled 2017-08-13 (×3): qty 200

## 2017-08-13 NOTE — Progress Notes (Signed)
ANTIBIOTIC CONSULT NOTE - INITIAL  Pharmacy Consult for Vancomycin  Indication: sepsis  No Known Allergies  Patient Measurements: Weight: 185 lb (83.9 kg) Adjusted Body Weight: 67.8 kg   Vital Signs: Temp: 99 F (37.2 C) (06/28 2337) Temp Source: Oral (06/28 2337) BP: 89/52 (06/29 0250) Pulse Rate: 90 (06/29 0250) Intake/Output from previous day: No intake/output data recorded. Intake/Output from this shift: No intake/output data recorded.  Labs: Recent Labs    08/12/17 2347  WBC 21.5*  HGB 13.4  PLT 196  CREATININE 1.06*   Estimated Creatinine Clearance: 60.4 mL/min (A) (by C-G formula based on SCr of 1.06 mg/dL (H)). No results for input(s): VANCOTROUGH, VANCOPEAK, VANCORANDOM, GENTTROUGH, GENTPEAK, GENTRANDOM, TOBRATROUGH, TOBRAPEAK, TOBRARND, AMIKACINPEAK, AMIKACINTROU, AMIKACIN in the last 72 hours.   Microbiology: No results found for this or any previous visit (from the past 720 hour(s)).  Medical History: Past Medical History:  Diagnosis Date  . Barrett's esophagus   . Dysrhythmia    stress related at work.  Marland Kitchen GERD (gastroesophageal reflux disease)   . Hepatitis C 2008  . Hyperlipidemia   . Hypertension   . Hypothyroidism   . Nephrolithiasis   . Osteoporosis   . Thyroid cancer ([place]) 2012  . Thyroid cancer ([place])     Medications:   (Not in a hospital admission) Assessment: CrCl = 60.4 ml/min Ke = 0.055 hr-1 T1/2 = 12.6 hrs Vd = 47.5 L   Goal of Therapy:  Vancomycin trough level 15-20 mcg/ml  Plan:  Expected duration 7 days with resolution of temperature and/or normalization of WBC   Vancomycin 1 gm IV X 1 ordered to be given on 6/29 @ 0300. Vancomycin 1 gm IV Q18H ordered to start on 6/29 @ 0900, ~ 6 hrs after 1st dose (stacked dosing). This pt will reach C[facility] by 7/1 @ 15:00. Will draw 1st trough on 7/1 @ 14:30, which will be at Css.   Darry Kelnhofer D 08/13/2017,3:07 AM

## 2017-08-13 NOTE — ED Notes (Signed)
Family at bedside. 

## 2017-08-13 NOTE — ED Provider Notes (Addendum)
[facility] Emergency Department Provider Note   ____________________________________________   First MD Initiated Contact with Patient 08/12/17 2340     (approximate)  I have reviewed the triage vital signs and the nursing notes.   HISTORY  Chief Complaint Emesis    HPI Felicia Manning is a 62 y.o. female who comes into the hospital today with some chills fever and vomiting.  She states that her symptoms started today.  She started vomiting at 9 PM and states that every time she vomited she had diarrhea as well.  She states that it was liquid and yellow in appearance.  She had a temperature to 100.9 at home.  Her grandson was sick with a sore throat and the patient did have a sore throat yesterday.  She reports that she had a kidney infection recently at the end of May and was treated with antibiotics.  The patient is also had some diverticulitis.  The patient has trouble with urinary incontinence at baseline.  She is here today for evaluation of her symptoms.  The patient denies any abdominal pain.   Past Medical History:  Diagnosis Date  . Barrett's esophagus   . Dysrhythmia    stress related at work.  Marland Kitchen GERD (gastroesophageal reflux disease)   . Hepatitis C 2008  . Hyperlipidemia   . Hypertension   . Hypothyroidism   . Nephrolithiasis   . Osteoporosis   . Thyroid cancer ([place]) 2012  . Thyroid cancer [facility])     Patient Active Problem List   Diagnosis Date Noted  . Sepsis ([place]) 08/13/2017  . Acute diverticulitis 06/18/2017  . 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 Fungal infection of toenail 06/11/2015  . 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 Barrett esophagus 05/02/2015  . Acid reflux 05/02/2015  . HCV (hepatitis C virus) 05/02/2015  . HLD (hyperlipidemia) 05/02/2015  . BP (high blood pressure) 05/02/2015  . Adult hypothyroidism 05/02/2015  . Elevated WBC count 05/02/2015  . 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[place])  05/02/2015  . Kidney stone   . Pyelonephritis   . UTI (lower urinary tract infection) 04/08/2015  . Flank pain 04/08/2015  . Chronic hepatitis C virus infection ([place]) 03/12/2015  . Essential (primary) hypertension 03/12/2015  . Gastro-esophageal reflux disease without esophagitis 03/12/2015  . Hypothyroidism, postop 03/12/2015  . H/O malignant neoplasm of thyroid 01/29/2014  . OP (osteoporosis) 10/06/2010  . Current tobacco use 10/06/2010  . Tobacco use 10/06/2010    Past Surgical History:  Procedure Laterality Date  . ABDOMINAL HYSTERECTOMY  2006  . COLONOSCOPY WITH PROPOFOL N/A 04/25/2015   Procedure: COLONOSCOPY WITH PROPOFOL;  Surgeon: Josefine Class, MD;  Location: [facility] ENDOSCOPY;  Service: Endoscopy;  Laterality: N/A;  . CYSTOSCOPY W/ RETROGRADES Bilateral 05/19/2015   Procedure: CYSTOSCOPY WITH RETROGRADE PYELOGRAM;  Surgeon: Hollice Espy, MD;  Location: [facility] ORS;  Service: Urology;  Laterality: Bilateral;  . CYSTOSCOPY W/ URETERAL STENT PLACEMENT Right 05/26/2015   Procedure: CYSTOSCOPY WITH STENT REPLACEMENT;  Surgeon: Hollice Espy, MD;  Location: [facility] ORS;  Service: Urology;  Laterality: Right;  . CYSTOSCOPY WITH STENT PLACEMENT Right 05/19/2015   Procedure: CYSTOSCOPY WITH STENT PLACEMENT;  Surgeon: Hollice Espy, MD;  Location: [facility] ORS;  Service: Urology;  Laterality: Right;  . CYSTOSCOPY WITH STENT PLACEMENT Right 05/26/2015   Procedure: CYSTOSCOPY WITH STENT PLACEMENT;  Surgeon: Hollice Espy, MD;  Location: [facility] ORS;  Service: Urology;  Laterality: Right;  . CYSTOSCOPY WITH STENT PLACEMENT Right 06/25/2016   Procedure: CYSTOSCOPY WITH STENT PLACEMENT;  Surgeon: Nickie Retort, MD;  Location: [facility] ORS;  Service: Urology;  Laterality: Right;  . CYSTOSCOPY/URETEROSCOPY/HOLMIUM LASER/STENT PLACEMENT Left 07/23/2015   Procedure: CYSTOSCOPY/URETEROSCOPY/HOLMIUM LASER/STENT PLACEMENT/RETROGRADE PYELOGRAM;  Surgeon: Hollice Espy, MD;  Location: [facility] ORS;  Service:  Urology;  Laterality: Left;  . ESOPHAGOGASTRODUODENOSCOPY (EGD) WITH PROPOFOL N/A 04/25/2015   Procedure: ESOPHAGOGASTRODUODENOSCOPY (EGD) WITH PROPOFOL;  Surgeon: Josefine Class, MD;  Location: [facility] ENDOSCOPY;  Service: Endoscopy;  Laterality: N/A;  . EYE SURGERY Bilateral 1964   lazy eye repair  . EYE SURGERY Bilateral 2001   lazy eye repair  . LITHOTRIPSY  2009  . THYROIDECTOMY  2010  . URETEROSCOPY WITH HOLMIUM LASER LITHOTRIPSY Right 05/19/2015   Procedure: URETEROSCOPY WITH HOLMIUM LASER LITHOTRIPSY;  Surgeon: Hollice Espy, MD;  Location: [facility] ORS;  Service: Urology;  Laterality: Right;  . URETEROSCOPY WITH HOLMIUM LASER LITHOTRIPSY Right 05/26/2015   Procedure: URETEROSCOPY WITH HOLMIUM LASER LITHOTRIPSY;  Surgeon: Hollice Espy, MD;  Location: [facility] ORS;  Service: Urology;  Laterality: Right;  . URETEROSCOPY WITH HOLMIUM LASER LITHOTRIPSY Right 06/25/2016   Procedure: URETEROSCOPY WITH HOLMIUM LASER LITHOTRIPSY;  Surgeon: Nickie Retort, MD;  Location: [facility] ORS;  Service: Urology;  Laterality: Right;    Prior to Admission medications   Medication Sig Start Date End Date Taking? Authorizing Provider  amLODipine (NORVASC) 10 MG tablet Take 10 mg by mouth daily.    [provider]  cholecalciferol (VITAMIN D) 1000 units tablet Take 2,000 Units by mouth daily.    [provider]  levothyroxine (SYNTHROID, LEVOTHROID) 150 MCG tablet Take 150 mcg by mouth daily at 10 pm.     [provider]  losartan (COZAAR) 100 MG tablet Take 100 mg by mouth every morning.    [provider]  metoprolol succinate (TOPROL-XL) 25 MG 24 hr tablet Take 25 mg by mouth every morning.     [provider]  omeprazole (PRILOSEC) 20 MG capsule Take 20 mg by mouth every morning.    [provider]    Allergies Patient has no known allergies.  Family History  Problem Relation Age of Onset  . CAD Mother   . Heart disease Mother   . Dementia Father    . Bladder Cancer Neg Hx   . Prostate cancer Neg Hx   . Kidney cancer Neg Hx     Social History Social History   Tobacco Use  . Smoking status: Current Every Day Smoker    Packs/day: 1.00    Years: 40.00    Pack years: 40.00    Types: Cigarettes  . Smokeless tobacco: Never Used  Substance Use Topics  . Alcohol use: Yes    Comment: socially  . Drug use: No    Review of Systems  Constitutional:  fever/chills Eyes: No visual changes. ENT: No sore throat. Cardiovascular: Denies chest pain. Respiratory: Denies shortness of breath. Gastrointestinal: Nausea, vomiting, diarrhea with no abdominal pain. No constipation. Genitourinary: Negative for dysuria. Musculoskeletal: Negative for back pain. Skin: Negative for rash. Neurological: Negative for headaches, focal weakness or numbness.   ____________________________________________   PHYSICAL EXAM:  VITAL SIGNS: ED Triage Vitals  Enc Vitals Group     BP 08/12/17 2337 102/60     Pulse Rate 08/12/17 2337 (!) 103     Resp --      Temp 08/12/17 2337 99 F (37.2 C)     Temp Source 08/12/17 2337 Oral     SpO2 08/12/17 2337 93 %     Weight 08/12/17  2331 185 lb (83.9 kg)     Height --      Head Circumference --      Peak Flow --      Pain Score 08/12/17 2331 0     Pain Loc --      Pain Edu? --      Excl. in [place]? --     Constitutional: Alert and oriented. Ill appearing and in moderate distress. Eyes: Conjunctivae are normal. PERRL. EOMI. Head: Atraumatic. Nose: No congestion/rhinnorhea. Mouth/Throat: Dry mucous membranes,  Oropharynx non-erythematous. Cardiovascular: Tachycardia, regular rhythm. Grossly normal heart sounds.  Good peripheral circulation. Respiratory: Increased respiratory effort.  No retractions. Lungs CTAB. Gastrointestinal: Soft and nontender. No distention.  Positive bowel sounds Musculoskeletal: No lower extremity tenderness nor edema.   Neurologic:  Normal speech and language.  Skin:  Skin is  warm, dry and intact.  Psychiatric: Mood and affect are normal.   ____________________________________________   LABS (all labs ordered are listed, but only abnormal results are displayed)  Labs Reviewed  COMPREHENSIVE METABOLIC PANEL - Abnormal; Notable for the following components:      Result Value   Potassium 3.2 (*)    CO2 20 (*)    Glucose, Bld 154 (*)    Creatinine, Ser 1.06 (*)    Calcium 8.2 (*)    Albumin 3.4 (*)    GFR calc non Af Amer 56 (*)    All other components within normal limits  CBC - Abnormal; Notable for the following components:   WBC 21.5 (*)    RDW 14.9 (*)    All other components within normal limits  URINALYSIS, COMPLETE (UACMP) WITH MICROSCOPIC - Abnormal; Notable for the following components:   Color, Urine AMBER (*)    APPearance CLOUDY (*)    Hgb urine dipstick SMALL (*)    Protein, ur 100 (*)    Nitrite POSITIVE (*)    Leukocytes, UA MODERATE (*)    WBC, UA >50 (*)    Bacteria, UA MANY (*)    All other components within normal limits  LACTIC ACID, PLASMA - Abnormal; Notable for the following components:   Lactic Acid, Venous 3.5 (*)    All other components within normal limits  GASTROINTESTINAL PANEL BY PCR, STOOL (REPLACES STOOL CULTURE)  C DIFFICILE QUICK SCREEN W PCR REFLEX  URINE CULTURE  CULTURE, BLOOD (ROUTINE X 2)  CULTURE, BLOOD (ROUTINE X 2)  LIPASE, BLOOD  LACTIC ACID, PLASMA   ____________________________________________  EKG  ED ECG REPORT I, Loney Hering, the attending physician, personally viewed and interpreted this ECG.   Date: 08/12/2017  EKG Time: 2353  Rate: 98  Rhythm: normal sinus rhythm  Axis: normal  Intervals:none  ST&T Change: none  ____________________________________________  RADIOLOGY  ED MD interpretation:  none  Official radiology report(s): No results found.  ____________________________________________   PROCEDURES  Procedure(s) performed: please, see procedure  note(s).  .Critical Care Performed by: Loney Hering, MD Authorized by: Loney Hering, MD   Critical care provider statement:    Critical care time (minutes):  30   Critical care start time:  08/12/2017 11:40 PM   Critical care end time:  08/13/2017 2:30 AM   Critical care time was exclusive of:  Separately billable procedures and treating other patients   Critical care was necessary to treat or prevent imminent or life-threatening deterioration of the following conditions:  Sepsis   Critical care was time spent personally by me on the following activities:  Development of treatment plan with patient or surrogate, evaluation of patient's response to treatment, examination of patient, obtaining history from patient or surrogate, ordering and performing treatments and interventions, ordering and review of laboratory studies, ordering and review of radiographic studies, pulse oximetry, re-evaluation of patient's condition and review of old charts   I assumed direction of critical care for this patient from another provider in my specialty: no      Critical Care performed: Yes, see critical care note(s)  ____________________________________________   INITIAL IMPRESSION / ASSESSMENT AND PLAN / ED COURSE  As part of my medical decision making, I reviewed the following data within the electronic MEDICAL RECORD NUMBER Notes from prior ED visits and [place] Controlled Substance Database   This is a 62 year old female who comes into the hospital today with some vomiting and diarrhea.  My differential diagnosis is gastroenteritis, diverticulitis, urinary tract infection.  We did check some blood work on the patient and the patient's white blood cell count is 21,000.  I ordered some stool cultures and C. difficile but the patient's urinalysis did reveal that she had positive nitrites many bacteria and greater than 50 white blood cells.  My concern is that the patient has a kidney infection that is  causing her vomiting and diarrhea.  I did order some ceftriaxone for the patient and she did receive a liter of normal saline.  I contacted the hospitalist but after I spoke with the hospitalist the patient's lactic acid returned at 3.5 and her blood pressure dropped to the 79K systolic.  I ordered 2 more liters of normal saline for the patient.  We also contacted the hospitalist to place the patient in the intensive care unit.  The patient still has no pain and is having no complaints at this time.  She will be admitted to the hospitalist service.      ____________________________________________   FINAL CLINICAL IMPRESSION(S) / ED DIAGNOSES  Final diagnoses:  Nausea vomiting and diarrhea  Pyelonephritis  Sepsis, due to unspecified organism [facility])     ED Discharge Orders    None       Note:  This document was prepared using Dragon voice recognition software and may include unintentional dictation errors.    Loney Hering, MD 08/13/17 0259    Loney Hering, MD 08/13/17 0300

## 2017-08-13 NOTE — Consult Note (Addendum)
H&P Physician requesting consult: Rosilyn Mings, MD  Chief Complaint: concern for sepsis, history of nephrolithiasis  History of Present Illness: 62 year old female who presented to the emergency department with confusion.  She had been having some diarrhea, nausea, vomiting.  She denies any flank pain.  She denies any abdominal pain.  She denies any voiding complaints.  She did present with hypotension and fever and met sepsis criteria and is now admitted to the ICU.  The patient upon presentation was covered in stool.  The patient is now lucid and states that she feels much improved.  She recently underwent a renal ultrasound that revealed no evidence of hydronephrosis and again redemonstrated bilateral nonobstructing renal calculi.  Hypotension and tachycardia have improved.  She continues to be just borderline hypotensive.  She has no complaints at all currently.  Diarrhea has resolved.  So has nausea and vomiting.  CT of the head showed no acute finding.  Labs upon arrival are significant for normal creatinine of 0.95.  Her lactic acid was initially 3.5 and improved to normal at 1.3 after fluid resuscitation.  Did have leukocytosis with white blood cell count of 21.5.  Urinalysis was positive for leukocytes, nitrites with many bacteria.  Urine culture and blood culture pending.  She was last seen by Dr. Erlene Quan about 1 year ago.  It was recommended that she follow-up in 1 year with a KUB but it looks like her appointment was canceled.  Past Medical History:  Diagnosis Date  . Barrett's esophagus   . Dysrhythmia    stress related at work.  Marland Kitchen GERD (gastroesophageal reflux disease)   . Hepatitis C 2008  . Hyperlipidemia   . Hypertension   . Hypothyroidism   . Nephrolithiasis   . Osteoporosis   . Thyroid cancer ([place]) 2012  . Thyroid cancer [facility])    Past Surgical History:  Procedure Laterality Date  . ABDOMINAL HYSTERECTOMY  2006  . COLONOSCOPY WITH PROPOFOL N/A 04/25/2015   Procedure:  COLONOSCOPY WITH PROPOFOL;  Surgeon: Josefine Class, MD;  Location: [facility] ENDOSCOPY;  Service: Endoscopy;  Laterality: N/A;  . CYSTOSCOPY W/ RETROGRADES Bilateral 05/19/2015   Procedure: CYSTOSCOPY WITH RETROGRADE PYELOGRAM;  Surgeon: Hollice Espy, MD;  Location: [facility] ORS;  Service: Urology;  Laterality: Bilateral;  . CYSTOSCOPY W/ URETERAL STENT PLACEMENT Right 05/26/2015   Procedure: CYSTOSCOPY WITH STENT REPLACEMENT;  Surgeon: Hollice Espy, MD;  Location: [facility] ORS;  Service: Urology;  Laterality: Right;  . CYSTOSCOPY WITH STENT PLACEMENT Right 05/19/2015   Procedure: CYSTOSCOPY WITH STENT PLACEMENT;  Surgeon: Hollice Espy, MD;  Location: [facility] ORS;  Service: Urology;  Laterality: Right;  . CYSTOSCOPY WITH STENT PLACEMENT Right 05/26/2015   Procedure: CYSTOSCOPY WITH STENT PLACEMENT;  Surgeon: Hollice Espy, MD;  Location: [facility] ORS;  Service: Urology;  Laterality: Right;  . CYSTOSCOPY WITH STENT PLACEMENT Right 06/25/2016   Procedure: CYSTOSCOPY WITH STENT PLACEMENT;  Surgeon: Nickie Retort, MD;  Location: [facility] ORS;  Service: Urology;  Laterality: Right;  . CYSTOSCOPY/URETEROSCOPY/HOLMIUM LASER/STENT PLACEMENT Left 07/23/2015   Procedure: CYSTOSCOPY/URETEROSCOPY/HOLMIUM LASER/STENT PLACEMENT/RETROGRADE PYELOGRAM;  Surgeon: Hollice Espy, MD;  Location: [facility] ORS;  Service: Urology;  Laterality: Left;  . ESOPHAGOGASTRODUODENOSCOPY (EGD) WITH PROPOFOL N/A 04/25/2015   Procedure: ESOPHAGOGASTRODUODENOSCOPY (EGD) WITH PROPOFOL;  Surgeon: Josefine Class, MD;  Location: [facility] ENDOSCOPY;  Service: Endoscopy;  Laterality: N/A;  . EYE SURGERY Bilateral 1964   lazy eye repair  . EYE SURGERY Bilateral 2001   lazy eye repair  . LITHOTRIPSY  2009  . THYROIDECTOMY  2010  . URETEROSCOPY WITH HOLMIUM LASER LITHOTRIPSY Right 05/19/2015   Procedure: URETEROSCOPY WITH HOLMIUM LASER LITHOTRIPSY;  Surgeon: Hollice Espy, MD;  Location: [facility] ORS;  Service: Urology;  Laterality: Right;  . URETEROSCOPY  WITH HOLMIUM LASER LITHOTRIPSY Right 05/26/2015   Procedure: URETEROSCOPY WITH HOLMIUM LASER LITHOTRIPSY;  Surgeon: Hollice Espy, MD;  Location: [facility] ORS;  Service: Urology;  Laterality: Right;  . URETEROSCOPY WITH HOLMIUM LASER LITHOTRIPSY Right 06/25/2016   Procedure: URETEROSCOPY WITH HOLMIUM LASER LITHOTRIPSY;  Surgeon: Nickie Retort, MD;  Location: [facility] ORS;  Service: Urology;  Laterality: Right;    Home Medications:  Medications Prior to Admission  Medication Sig Dispense Refill Last Dose  . amLODipine (NORVASC) 10 MG tablet Take 10 mg by mouth daily.   06/18/2017 at 0800  . cholecalciferol (VITAMIN D) 1000 units tablet Take 2,000 Units by mouth daily.   06/18/2017 at 0800  . ciclopirox (PENLAC) 8 % solution Apply 1 application topically at bedtime. Apply over nail and surrounding skin  11   . levothyroxine (SYNTHROID, LEVOTHROID) 150 MCG tablet Take 150 mcg by mouth daily at 10 pm.    06/17/2017 at 2200  . losartan (COZAAR) 100 MG tablet Take 100 mg by mouth every morning.   06/18/2017 at 0800  . metoprolol succinate (TOPROL-XL) 25 MG 24 hr tablet Take 25 mg by mouth every morning.   1 06/18/2017 at 0800  . omeprazole (PRILOSEC) 20 MG capsule Take 20 mg by mouth every morning.   06/18/2017 at 0800   Allergies: No Known Allergies  Family History  Problem Relation Age of Onset  . CAD Mother   . Heart disease Mother   . Dementia Father   . Bladder Cancer Neg Hx   . Prostate cancer Neg Hx   . Kidney cancer Neg Hx    Social History:  reports that she has been smoking cigarettes.  She has a 40.00 pack-year smoking history. She has never used smokeless tobacco. She reports that she drinks alcohol. She reports that she does not use drugs.  ROS: A complete review of systems was performed.  All systems are negative except for pertinent findings as noted. ROS   Physical Exam:  Vital signs in last 24 hours: Temp:  [99 F (37.2 C)-99.1 F (37.3 C)] 99 F (37.2 C) (06/29 0800) Pulse Rate:   [83-103] 93 (06/29 1000) Resp:  [19-47] 21 (06/29 1000) BP: (74-116)/(44-60) 103/47 (06/29 0900) SpO2:  [91 %-100 %] 100 % (06/29 1000) Weight:  [83.9 kg (185 lb)-86.1 kg (189 lb 13.1 oz)] 86.1 kg (189 lb 13.1 oz) (06/29 0357) General:  Alert and oriented, No acute distress HEENT: Normocephalic, atraumatic Neck: No JVD or lymphadenopathy Cardiovascular: Regular rate and rhythm, borderline hypotensive Lungs: Regular rate and effort Abdomen: Soft, obese, nontender, nondistended, no abdominal masses Back: No CVA tenderness Extremities: No edema Neurologic: Grossly intact  Laboratory Data:  Results for orders placed or performed during the hospital encounter of 08/12/17 (from the past 24 hour(s))  Lipase, blood     Status: None   Collection Time: 08/12/17 11:47 PM  Result Value Ref Range   Lipase 28 11 - 51 U/L  Comprehensive metabolic panel     Status: Abnormal   Collection Time: 08/12/17 11:47 PM  Result Value Ref Range   Sodium 137 135 - 145 mmol/L   Potassium 3.2 (L) 3.5 - 5.1 mmol/L   Chloride 106 98 - 111 mmol/L   CO2 20 (L) 22 - 32 mmol/L   Glucose,  Bld 154 (H) 70 - 99 mg/dL   BUN 15 6 - 20 mg/dL   Creatinine, Ser 1.06 (H) 0.44 - 1.00 mg/dL   Calcium 8.2 (L) 8.9 - 10.3 mg/dL   Total Protein 6.7 6.5 - 8.1 g/dL   Albumin 3.4 (L) 3.5 - 5.0 g/dL   AST 34 15 - 41 U/L   ALT 20 0 - 44 U/L   Alkaline Phosphatase 80 38 - 126 U/L   Total Bilirubin 0.7 0.3 - 1.2 mg/dL   GFR calc non Af Amer 56 (L) >60 mL/min   GFR calc Af Amer >60 >60 mL/min   Anion gap 11 5 - 15  CBC     Status: Abnormal   Collection Time: 08/12/17 11:47 PM  Result Value Ref Range   WBC 21.5 (H) 3.6 - 11.0 K/uL   RBC 4.48 3.80 - 5.20 MIL/uL   Hemoglobin 13.4 12.0 - 16.0 g/dL   HCT 40.9 35.0 - 47.0 %   MCV 91.4 80.0 - 100.0 fL   MCH 30.0 26.0 - 34.0 pg   MCHC 32.8 32.0 - 36.0 g/dL   RDW 14.9 (H) 11.5 - 14.5 %   Platelets 196 150 - 440 K/uL  Lactic acid, plasma     Status: Abnormal   Collection Time:  08/12/17 11:47 PM  Result Value Ref Range   Lactic Acid, Venous 3.5 (HH) 0.5 - 1.9 mmol/L  TSH     Status: None   Collection Time: 08/12/17 11:47 PM  Result Value Ref Range   TSH 2.274 0.350 - 4.500 uIU/mL  Urinalysis, Complete w Microscopic     Status: Abnormal   Collection Time: 08/13/17  1:25 AM  Result Value Ref Range   Color, Urine AMBER (A) YELLOW   APPearance CLOUDY (A) CLEAR   Specific Gravity, Urine 1.014 1.005 - 1.030   pH 5.0 5.0 - 8.0   Glucose, UA NEGATIVE NEGATIVE mg/dL   Hgb urine dipstick SMALL (A) NEGATIVE   Bilirubin Urine NEGATIVE NEGATIVE   Ketones, ur NEGATIVE NEGATIVE mg/dL   Protein, ur 100 (A) NEGATIVE mg/dL   Nitrite POSITIVE (A) NEGATIVE   Leukocytes, UA MODERATE (A) NEGATIVE   RBC / HPF 21-50 0 - 5 RBC/hpf   WBC, UA >50 (H) 0 - 5 WBC/hpf   Bacteria, UA MANY (A) NONE SEEN   Squamous Epithelial / LPF 0-5 0 - 5   WBC Clumps PRESENT    Mucus PRESENT    Amorphous Crystal PRESENT   Blood culture (routine x 2)     Status: None (Preliminary result)   Collection Time: 08/13/17  2:25 AM  Result Value Ref Range   Specimen Description BLOOD LEFT WRIST    Special Requests      BOTTLES DRAWN AEROBIC AND ANAEROBIC Blood Culture adequate volume   Culture      NO GROWTH < 12 HOURS Performed at [facility], [address] [place], [place] [address]    Report Status PENDING   Blood culture (routine x 2)     Status: None (Preliminary result)   Collection Time: 08/13/17  2:33 AM  Result Value Ref Range   Specimen Description BLOOD RIGHT WRIST    Special Requests      BOTTLES DRAWN AEROBIC AND ANAEROBIC Blood Culture results may not be optimal due to an excessive volume of blood received in culture bottles Performed at [facility], [address], [place], [place] [address]    Culture PENDING    Report  Status PENDING   Glucose, capillary     Status: Abnormal   Collection Time: 08/13/17  3:54 AM  Result Value Ref Range   Glucose-Capillary  158 (H) 70 - 99 mg/dL  Lactic acid, plasma     Status: None   Collection Time: 08/13/17  4:28 AM  Result Value Ref Range   Lactic Acid, Venous 1.3 0.5 - 1.9 mmol/L  Magnesium     Status: Abnormal   Collection Time: 08/13/17  4:28 AM  Result Value Ref Range   Magnesium 1.1 (L) 1.7 - 2.4 mg/dL  Phosphorus     Status: Abnormal   Collection Time: 08/13/17  4:28 AM  Result Value Ref Range   Phosphorus <1.0 (LL) 2.5 - 4.6 mg/dL  Basic metabolic panel     Status: Abnormal   Collection Time: 08/13/17  4:28 AM  Result Value Ref Range   Sodium 137 135 - 145 mmol/L   Potassium 2.8 (L) 3.5 - 5.1 mmol/L   Chloride 112 (H) 98 - 111 mmol/L   CO2 19 (L) 22 - 32 mmol/L   Glucose, Bld 163 (H) 70 - 99 mg/dL   BUN 13 6 - 20 mg/dL   Creatinine, Ser 0.95 0.44 - 1.00 mg/dL   Calcium 7.0 (L) 8.9 - 10.3 mg/dL   GFR calc non Af Amer >60 >60 mL/min   GFR calc Af Amer >60 >60 mL/min   Anion gap 6 5 - 15  Troponin I     Status: Abnormal   Collection Time: 08/13/17  4:28 AM  Result Value Ref Range   Troponin I 0.05 (HH) <0.03 ng/mL  Procalcitonin - Baseline     Status: None   Collection Time: 08/13/17  4:32 AM  Result Value Ref Range   Procalcitonin 44.01 ng/mL  MRSA PCR Screening     Status: None   Collection Time: 08/13/17  4:48 AM  Result Value Ref Range   MRSA by PCR NEGATIVE NEGATIVE   Recent Results (from the past 240 hour(s))  Blood culture (routine x 2)     Status: None (Preliminary result)   Collection Time: 08/13/17  2:25 AM  Result Value Ref Range Status   Specimen Description BLOOD LEFT WRIST  Final   Special Requests   Final    BOTTLES DRAWN AEROBIC AND ANAEROBIC Blood Culture adequate volume   Culture   Final    NO GROWTH < 12 HOURS Performed at [facility], [place], [address] [place], [place] [address]    Report Status PENDING  Incomplete  Blood culture (routine x 2)     Status: None (Preliminary result)   Collection Time: 08/13/17  2:33 AM  Result Value Ref Range  Status   Specimen Description BLOOD RIGHT WRIST  Final   Special Requests   Final    BOTTLES DRAWN AEROBIC AND ANAEROBIC Blood Culture results may not be optimal due to an excessive volume of blood received in culture bottles Performed at [facility], [address] [place], [place] [address]    Culture PENDING  Incomplete   Report Status PENDING  Incomplete  MRSA PCR Screening     Status: None   Collection Time: 08/13/17  4:48 AM  Result Value Ref Range Status   MRSA by PCR NEGATIVE NEGATIVE Final    Comment:        The GeneXpert MRSA Assay (FDA approved for NASAL specimens only), is one component of a comprehensive MRSA colonization surveillance program. It is not intended  to diagnose MRSA infection nor to guide or monitor treatment for MRSA infections. Performed at [facility], [address] [place], [place] [address]    Creatinine: Recent Labs    08/12/17 2347 08/13/17 0428  CREATININE 1.06* 0.95   Renal ultrasound personally reviewed: No evidence of hydronephrosis bilaterally.  She has bilateral simple cyst.  No evidence of complex mass.  She has bilateral nonobstructing renal calculi.  Impression/Assessment:  Hypotension possibly secondary to sepsis. Possible urinary tract infection Nonobstructing bilateral renal calculi  Plan:  She has no hydronephrosis.  She also has no pain or voiding complaints.  Low suspicion for obstructing ureteral calculus at this time.  If she does not clinically improve or should she clinically decline, can perform a noncontrast CT of the abdomen and pelvis renal stone protocol.  Can hold off on this for now given that she is asymptomatic from a urinary tract standpoint and ultrasound showed no evidence of obstruction.  Agree with antibiotics and follow-up on cultures.  Agree with fluid resuscitation as she is improving from a clinical standpoint.  Suspect her symptoms may be related to a GI source, possibly viral.   She can follow-up outpatient with urology.  No further intervention from my standpoint.  Marton Redwood, III 08/13/2017, 11:51 AM

## 2017-08-13 NOTE — Progress Notes (Signed)
CODE SEPSIS - PHARMACY COMMUNICATION  **Broad Spectrum Antibiotics should be administered within 1 hour of Sepsis diagnosis**  Time Code Sepsis Called/Page Received: 6/29 @  2:11   Antibiotics Ordered:  Ceftriaxone   Time of 1st antibiotic administration:  6/29 @ 0237   Additional action taken by pharmacy: none  If necessary, Name of Provider/Nurse Contacted:     Deuce Paternoster D ,PharmD Clinical Pharmacist  08/13/2017  2:53 AM

## 2017-08-13 NOTE — H&P (Signed)
Felicia Manning is an 62 y.o. female.   Chief Complaint: Vomiting HPI: The patient with past medical history of hypothyroidism following thyroidectomy due to thyroid cancer, GERD, hypertension and kidney stones presents to the emergency department due to confusion, agitation and diarrhea.  All of the patient's symptoms began with multiple episodes of non-bloody non-bilious emesis.  The patient was in her usual state of health yesterday but began to feel fatigued and feverish this morning.  She developed chills by the afternoon and by evening she began vomiting.  She also became confused and combative with her daughter.  She presented to the emergency department via EMS covered in stool.  She was found to have urinary tract infection and met sepsis criteria.  The emergency department staff initiated sepsis protocol prior to the patient becoming hypotensive.  She has responded well to fluid resuscitation.  Now that she has stabilized emergency department staff called the hospitalist service for admission.  Past Medical History:  Diagnosis Date  . Barrett's esophagus   . Dysrhythmia    stress related at work.  Marland Kitchen GERD (gastroesophageal reflux disease)   . Hepatitis C 2008  . Hyperlipidemia   . Hypertension   . Hypothyroidism   . Nephrolithiasis   . Osteoporosis   . Thyroid cancer ([place]) 2012  . Thyroid cancer [facility])     Past Surgical History:  Procedure Laterality Date  . ABDOMINAL HYSTERECTOMY  2006  . COLONOSCOPY WITH PROPOFOL N/A 04/25/2015   Procedure: COLONOSCOPY WITH PROPOFOL;  Surgeon: Josefine Class, MD;  Location: [facility] ENDOSCOPY;  Service: Endoscopy;  Laterality: N/A;  . CYSTOSCOPY W/ RETROGRADES Bilateral 05/19/2015   Procedure: CYSTOSCOPY WITH RETROGRADE PYELOGRAM;  Surgeon: Hollice Espy, MD;  Location: [facility] ORS;  Service: Urology;  Laterality: Bilateral;  . CYSTOSCOPY W/ URETERAL STENT PLACEMENT Right 05/26/2015   Procedure: CYSTOSCOPY WITH STENT REPLACEMENT;  Surgeon: Hollice Espy, MD;  Location: [facility] ORS;  Service: Urology;  Laterality: Right;  . CYSTOSCOPY WITH STENT PLACEMENT Right 05/19/2015   Procedure: CYSTOSCOPY WITH STENT PLACEMENT;  Surgeon: Hollice Espy, MD;  Location: [facility] ORS;  Service: Urology;  Laterality: Right;  . CYSTOSCOPY WITH STENT PLACEMENT Right 05/26/2015   Procedure: CYSTOSCOPY WITH STENT PLACEMENT;  Surgeon: Hollice Espy, MD;  Location: [facility] ORS;  Service: Urology;  Laterality: Right;  . CYSTOSCOPY WITH STENT PLACEMENT Right 06/25/2016   Procedure: CYSTOSCOPY WITH STENT PLACEMENT;  Surgeon: Nickie Retort, MD;  Location: [facility] ORS;  Service: Urology;  Laterality: Right;  . CYSTOSCOPY/URETEROSCOPY/HOLMIUM LASER/STENT PLACEMENT Left 07/23/2015   Procedure: CYSTOSCOPY/URETEROSCOPY/HOLMIUM LASER/STENT PLACEMENT/RETROGRADE PYELOGRAM;  Surgeon: Hollice Espy, MD;  Location: [facility] ORS;  Service: Urology;  Laterality: Left;  . ESOPHAGOGASTRODUODENOSCOPY (EGD) WITH PROPOFOL N/A 04/25/2015   Procedure: ESOPHAGOGASTRODUODENOSCOPY (EGD) WITH PROPOFOL;  Surgeon: Josefine Class, MD;  Location: [facility] ENDOSCOPY;  Service: Endoscopy;  Laterality: N/A;  . EYE SURGERY Bilateral 1964   lazy eye repair  . EYE SURGERY Bilateral 2001   lazy eye repair  . LITHOTRIPSY  2009  . THYROIDECTOMY  2010  . URETEROSCOPY WITH HOLMIUM LASER LITHOTRIPSY Right 05/19/2015   Procedure: URETEROSCOPY WITH HOLMIUM LASER LITHOTRIPSY;  Surgeon: Hollice Espy, MD;  Location: [facility] ORS;  Service: Urology;  Laterality: Right;  . URETEROSCOPY WITH HOLMIUM LASER LITHOTRIPSY Right 05/26/2015   Procedure: URETEROSCOPY WITH HOLMIUM LASER LITHOTRIPSY;  Surgeon: Hollice Espy, MD;  Location: [facility] ORS;  Service: Urology;  Laterality: Right;  . URETEROSCOPY WITH HOLMIUM LASER LITHOTRIPSY Right 06/25/2016   Procedure: URETEROSCOPY WITH HOLMIUM LASER LITHOTRIPSY;  Surgeon:  Nickie Retort, MD;  Location: [facility] ORS;  Service: Urology;  Laterality: Right;    Family History  Problem  Relation Age of Onset  . CAD Mother   . Heart disease Mother   . Dementia Father   . Bladder Cancer Neg Hx   . Prostate cancer Neg Hx   . Kidney cancer Neg Hx    Social History:  reports that she has been smoking cigarettes.  She has a 40.00 pack-year smoking history. She has never used smokeless tobacco. She reports that she drinks alcohol. She reports that she does not use drugs.  Allergies: No Known Allergies  Medications Prior to Admission  Medication Sig Dispense Refill  . amLODipine (NORVASC) 10 MG tablet Take 10 mg by mouth daily.    . cholecalciferol (VITAMIN D) 1000 units tablet Take 2,000 Units by mouth daily.    . ciclopirox (PENLAC) 8 % solution Apply 1 application topically at bedtime. Apply over nail and surrounding skin  11  . levothyroxine (SYNTHROID, LEVOTHROID) 150 MCG tablet Take 150 mcg by mouth daily at 10 pm.     . losartan (COZAAR) 100 MG tablet Take 100 mg by mouth every morning.    . metoprolol succinate (TOPROL-XL) 25 MG 24 hr tablet Take 25 mg by mouth every morning.   1  . omeprazole (PRILOSEC) 20 MG capsule Take 20 mg by mouth every morning.      Results for orders placed or performed during the hospital encounter of 08/12/17 (from the past 48 hour(s))  Lipase, blood     Status: None   Collection Time: 08/12/17 11:47 PM  Result Value Ref Range   Lipase 28 11 - 51 U/L    Comment: Performed at [facility], [address] [place], [place] [address]  Comprehensive metabolic panel     Status: Abnormal   Collection Time: 08/12/17 11:47 PM  Result Value Ref Range   Sodium 137 135 - 145 mmol/L   Potassium 3.2 (L) 3.5 - 5.1 mmol/L   Chloride 106 98 - 111 mmol/L    Comment: Please note change in reference range.   CO2 20 (L) 22 - 32 mmol/L   Glucose, Bld 154 (H) 70 - 99 mg/dL    Comment: Please note change in reference range.   BUN 15 6 - 20 mg/dL    Comment: Please note change in reference range.   Creatinine, Ser 1.06 (H) 0.44 - 1.00 mg/dL    Calcium 8.2 (L) 8.9 - 10.3 mg/dL   Total Protein 6.7 6.5 - 8.1 g/dL   Albumin 3.4 (L) 3.5 - 5.0 g/dL   AST 34 15 - 41 U/L   ALT 20 0 - 44 U/L    Comment: Please note change in reference range.   Alkaline Phosphatase 80 38 - 126 U/L   Total Bilirubin 0.7 0.3 - 1.2 mg/dL   GFR calc non Af Amer 56 (L) >60 mL/min   GFR calc Af Amer >60 >60 mL/min    Comment: (NOTE) The eGFR has been calculated using the CKD EPI equation. This calculation has not been validated in all clinical situations. eGFR's persistently <60 mL/min signify possible Chronic Kidney Disease.    Anion gap 11 5 - 15    Comment: Performed at [facility], [address] [place], [place] [address]  CBC     Status: Abnormal   Collection Time: 08/12/17 11:47 PM  Result Value Ref Range   WBC 21.5 (H) 3.6 - 11.0  K/uL   RBC 4.48 3.80 - 5.20 MIL/uL   Hemoglobin 13.4 12.0 - 16.0 g/dL   HCT 40.9 35.0 - 47.0 %   MCV 91.4 80.0 - 100.0 fL   MCH 30.0 26.0 - 34.0 pg   MCHC 32.8 32.0 - 36.0 g/dL   RDW 14.9 (H) 11.5 - 14.5 %   Platelets 196 150 - 440 K/uL    Comment: Performed at [facility], [address] [place], [place] [address]  Lactic acid, plasma     Status: Abnormal   Collection Time: 08/12/17 11:47 PM  Result Value Ref Range   Lactic Acid, Venous 3.5 (HH) 0.5 - 1.9 mmol/L    Comment: CRITICAL RESULT CALLED TO, READ BACK BY AND VERIFIED WITH JENNIFER ELLINGTON AT 0221 08/13/17.PMH Performed at [facility], [address] [place], [place] [address]   Urinalysis, Complete w Microscopic     Status: Abnormal   Collection Time: 08/13/17  1:25 AM  Result Value Ref Range   Color, Urine AMBER (A) YELLOW    Comment: BIOCHEMICALS MAY BE AFFECTED BY COLOR   APPearance CLOUDY (A) CLEAR   Specific Gravity, Urine 1.014 1.005 - 1.030   pH 5.0 5.0 - 8.0   Glucose, UA NEGATIVE NEGATIVE mg/dL   Hgb urine dipstick SMALL (A) NEGATIVE   Bilirubin Urine NEGATIVE NEGATIVE   Ketones, ur NEGATIVE NEGATIVE  mg/dL   Protein, ur 100 (A) NEGATIVE mg/dL   Nitrite POSITIVE (A) NEGATIVE   Leukocytes, UA MODERATE (A) NEGATIVE   RBC / HPF 21-50 0 - 5 RBC/hpf   WBC, UA >50 (H) 0 - 5 WBC/hpf   Bacteria, UA MANY (A) NONE SEEN   Squamous Epithelial / LPF 0-5 0 - 5   WBC Clumps PRESENT    Mucus PRESENT    Amorphous Crystal PRESENT     Comment: Performed at [facility], [address] [place], [place] [address]  Glucose, capillary     Status: Abnormal   Collection Time: 08/13/17  3:54 AM  Result Value Ref Range   Glucose-Capillary 158 (H) 70 - 99 mg/dL   No results found.  Review of Systems  Constitutional: Positive for chills. Negative for fever.  HENT: Negative for sore throat and tinnitus.   Eyes: Negative for blurred vision and redness.  Respiratory: Negative for cough and shortness of breath.   Cardiovascular: Negative for chest pain, palpitations, orthopnea and PND.  Gastrointestinal: Positive for diarrhea, nausea and vomiting. Negative for abdominal pain.  Genitourinary: Negative for dysuria, frequency and urgency.  Musculoskeletal: Negative for joint pain and myalgias.  Skin: Negative for rash.       No lesions  Neurological: Negative for speech change, focal weakness and weakness.  Endo/Heme/Allergies: Does not bruise/bleed easily.       No temperature intolerance  Psychiatric/Behavioral: Negative for depression and suicidal ideas.    Blood pressure (!) 91/50, pulse 95, temperature (P) 99.1 F (37.3 C), temperature source (P) Oral, resp. rate (!) 24, weight 83.9 kg (185 lb), SpO2 95 %. Physical Exam  Vitals reviewed. Constitutional: She is oriented to person, place, and time. She appears well-developed and well-nourished. No distress.  HENT:  Head: Normocephalic and atraumatic.  Mouth/Throat: Oropharynx is clear and moist.  Eyes: Pupils are equal, round, and reactive to light. Conjunctivae and EOM are normal. No scleral icterus.  Neck: Normal range of motion. Neck  supple. No JVD present. No tracheal deviation present. No thyromegaly present.  Cardiovascular: Normal rate, regular rhythm and  normal heart sounds. Exam reveals no gallop and no friction rub.  No murmur heard. Respiratory: Effort normal and breath sounds normal. No respiratory distress.  GI: Soft. Bowel sounds are normal. She exhibits no distension. There is no tenderness.  Genitourinary:  Genitourinary Comments: Deferred  Musculoskeletal: Normal range of motion.  Lymphadenopathy:    She has no cervical adenopathy.  Neurological: She is alert and oriented to person, place, and time. No cranial nerve deficit. She exhibits normal muscle tone.  Skin: Skin is warm and dry. No rash noted. No erythema.  Psychiatric: She has a normal mood and affect. Her behavior is normal. Judgment and thought content normal.     Assessment/Plan This is a 62 year old female admitted for sepsis.   1.  Sepsis: The patient meets criteria via tachypnea and leukocytosis.  Patient's lactic acid is also elevated.  We will aggressively hydrate with intravenous fluid.  The patient's blood pressure is getting lower but she seems to be fluid responsive.  Initiate pressor therapy if needed.  Follow blood and urine cultures for growth and sensitivities.   2.  UTI: Present on admission.  The patient has had multiple urinary tract infections as well as multiple lithotripsy procedures for kidney stones.  Differential diagnosis of recurrent infections is possible renal or diverticular abscess. She has not had imaging since her admission to the hospital last month.  Consider repeat CT scan when more stable.  Continue ceftriaxone and vancomycin 3.  Hypothyroidism: TSH; continue Synthroid 4.  Hypertension: Hold antihypertensive medications for now with the patient has sepsis associated hypotension. 5.  DVT prophylaxis: Heparin 6.  GI prophylaxis: Pantoprazole per home regimen The patient is a full code.  Time spent on admission orders  and critical care approximately 45 minutes.  Discussed with [facility] telemedicine  Harrie Foreman, MD 08/13/2017, 4:02 AM

## 2017-08-13 NOTE — ED Notes (Signed)
Informed MD of patient's lactic acid level, and of patient's BP. MD Dahlia Client to bedside

## 2017-08-13 NOTE — ED Notes (Signed)
Patient given water per MD Dahlia Client

## 2017-08-13 NOTE — Consult Note (Signed)
[place] NOTE   Pharmacy Consult for electrolytes Indication: electrolyte disturbance  No Known Allergies  Patient Measurements: Height: 5\' 7"  (170.2 cm) Weight: 189 lb 13.1 oz (86.1 kg) IBW/kg (Calculated) : 61.6 Adjusted Body Weight:   Vital Signs: Temp: 99.2 F (37.3 C) (06/29 1200) Temp Source: Oral (06/29 1200) BP: 93/56 (06/29 1300) Pulse Rate: 81 (06/29 1300) Intake/Output from previous day: 06/28 0701 - 06/29 0700 In: -  Out: 400 [Urine:400] Intake/Output from this shift: Total I/O In: -  Out: 800 [Urine:800]  Labs: Recent Labs    08/12/17 2347 08/13/17 0428 08/13/17 1422  WBC 21.5*  --   --   HGB 13.4  --   --   HCT 40.9  --   --   PLT 196  --   --   CREATININE 1.06* 0.95  --   MG  --  1.1* 2.3  PHOS  --  <1.0*  --   ALBUMIN 3.4*  --   --   PROT 6.7  --   --   AST 34  --   --   ALT 20  --   --   ALKPHOS 80  --   --   BILITOT 0.7  --   --    Estimated Creatinine Clearance: 71 mL/min (by C-G formula based on SCr of 0.95 mg/dL).   Microbiology: Recent Results (from the past 720 hour(s))  Blood culture (routine x 2)     Status: None (Preliminary result)   Collection Time: 08/13/17  2:25 AM  Result Value Ref Range Status   Specimen Description BLOOD LEFT WRIST  Final   Special Requests   Final    BOTTLES DRAWN AEROBIC AND ANAEROBIC Blood Culture adequate volume   Culture   Final    NO GROWTH < 12 HOURS Performed at [facility], [address] [place], [place] [address]    Report Status PENDING  Incomplete  Blood culture (routine x 2)     Status: None (Preliminary result)   Collection Time: 08/13/17  2:33 AM  Result Value Ref Range Status   Specimen Description BLOOD RIGHT WRIST  Final   Special Requests   Final    BOTTLES DRAWN AEROBIC AND ANAEROBIC Blood Culture results may not be optimal due to an excessive volume of blood received in culture bottles Performed at [facility], [address]  [place], [place] [address]    Culture PENDING  Incomplete   Report Status PENDING  Incomplete  MRSA PCR Screening     Status: None   Collection Time: 08/13/17  4:48 AM  Result Value Ref Range Status   MRSA by PCR NEGATIVE NEGATIVE Final    Comment:        The GeneXpert MRSA Assay (FDA approved for NASAL specimens only), is one component of a comprehensive MRSA colonization surveillance program. It is not intended to diagnose MRSA infection nor to guide or monitor treatment for MRSA infections. Performed at [facility], [address] [place], [place] [address]     Medical History: Past Medical History:  Diagnosis Date  . Barrett's esophagus   . Dysrhythmia    stress related at work.  Marland Kitchen GERD (gastroesophageal reflux disease)   . Hepatitis C 2008  . Hyperlipidemia   . Hypertension   . Hypothyroidism   . Nephrolithiasis   . Osteoporosis   . Thyroid cancer ([place]) 2012  . Thyroid cancer ([place])     Medications:  Scheduled:  .  docusate sodium  100 mg Oral BID  . heparin  5,000 Units Subcutaneous Q8H  . levothyroxine  150 mcg Oral Q2200  . phosphorus  500 mg Oral BID    Assessment: Pt admitted with confusion, agitation, and diarrhea. Found to have phos <1, Mg 1.1 and K 2.8 this AM. Pharmacy consulted for electrolyte management.  Goal of Therapy:  Normalization of electrolytes  Plan:  ICU NP ordered Kphos 55mmol IV x 2 doses and Mg 2g IV. I will give another 2g IV of Mg. Will recheck K and Mg at 1400 (second K phos will not be finished infusing at that time) and then all electrolytes again in the AM.  6/29 1422: K=4.1, Mag=2.3. No supplemental doses at this time. F/u labs in am. Note: Patient on IVF NS w/ KCL 40 meq/L @ 125 ml/hr  Chinita Greenland PharmD Clinical Pharmacist 08/13/2017

## 2017-08-13 NOTE — Progress Notes (Signed)
CRITICAL VALUE ALERT  Critical Value:  0.05 troponin, phosphorus <1 Magnesium of 1.1, potassium of 2.8 Date & Time Notied:  08/13/2017  05:33  Provider Notified: Burman Nieves, NP  Orders Received/Actions taken: see [facility]

## 2017-08-13 NOTE — Consult Note (Signed)
PULMONARY / CRITICAL CARE MEDICINE   Name: Felicia Manning MRN: [id number] DOB: 06-18-1955    ADMISSION DATE:  08/12/2017   CONSULTATION DATE:  08/13/2017  REFERRING MD:  Dr Marcille Blanco  Reason: Septic shock  HISTORY OF PRESENT ILLNESS:   This is a 62 year old female with a medical history as indicated below, recently hospitalized for acute diverticulitis, who presented to the ED with sudden onset confusion agitation and diarrhea.  Patient was in her usual state of health when yesterday morning she started feeling weak and later in the afternoon she developed a fever and started having multiple episodes of nausea and vomiting.  She later became combative and confused hands EMS was called.  When EMS arrived, and was incontinent of urine and stool.  ED work-up revealed an abnormal urinalysis, WBC of 21.5K, and a lactic acid of 3.5.  At the ED, patient became hypotensive.  Despite fluid resuscitation her blood pressure remained marginal hence she is being admitted to the ICU for further management. Patient has urinary incontinence at baseline.  She was recently treated for a "kidney infection" during her last trip to [place]. Patient's mental status has improved with IV fluids and antibiotics.  She denies chest pain palpitations and headache.  Nausea and diarrhea has improved.  PAST MEDICAL HISTORY :  She  has a past medical history of Barrett's esophagus, Dysrhythmia, GERD (gastroesophageal reflux disease), Hepatitis C (2008), Hyperlipidemia, Hypertension, Hypothyroidism, Nephrolithiasis, Osteoporosis, Thyroid cancer ([place]) (2012), and Thyroid cancer ([place]).  PAST SURGICAL HISTORY: She  has a past surgical history that includes Colonoscopy with propofol (N/A, 04/25/2015); Esophagogastroduodenoscopy (egd) with propofol (N/A, 04/25/2015); Thyroidectomy (2010); Abdominal hysterectomy (2006); Lithotripsy (2009); Eye surgery (Bilateral, 1964); Eye surgery (Bilateral, 2001); Ureteroscopy with holmium  laser lithotripsy (Right, 05/19/2015); Cystoscopy with stent placement (Right, 05/19/2015); Cystoscopy w/ retrogrades (Bilateral, 05/19/2015); Ureteroscopy with holmium laser lithotripsy (Right, 05/26/2015); Cystoscopy w/ ureteral stent placement (Right, 05/26/2015); Cystoscopy with stent placement (Right, 05/26/2015); Cystoscopy/ureteroscopy/holmium laser/stent placement (Left, 07/23/2015); Ureteroscopy with holmium laser lithotripsy (Right, 06/25/2016); and Cystoscopy with stent placement (Right, 06/25/2016).  No Known Allergies  No current facility-administered medications on file prior to encounter.    Current Outpatient Medications on File Prior to Encounter  Medication Sig  . amLODipine (NORVASC) 10 MG tablet Take 10 mg by mouth daily.  . cholecalciferol (VITAMIN D) 1000 units tablet Take 2,000 Units by mouth daily.  . ciclopirox (PENLAC) 8 % solution Apply 1 application topically at bedtime. Apply over nail and surrounding skin  . levothyroxine (SYNTHROID, LEVOTHROID) 150 MCG tablet Take 150 mcg by mouth daily at 10 pm.   . losartan (COZAAR) 100 MG tablet Take 100 mg by mouth every morning.  . metoprolol succinate (TOPROL-XL) 25 MG 24 hr tablet Take 25 mg by mouth every morning.   Marland Kitchen omeprazole (PRILOSEC) 20 MG capsule Take 20 mg by mouth every morning.    FAMILY HISTORY:  Her indicated that her mother is alive. She indicated that her father is alive. She indicated that her sister is alive. She indicated that the status of her neg hx is unknown.   SOCIAL HISTORY: She  reports that she has been smoking cigarettes.  She has a 40.00 pack-year smoking history. She has never used smokeless tobacco. She reports that she drinks alcohol. She reports that she does not use drugs.  REVIEW OF SYSTEMS:   Constitutional: Positive for fever and chills.  HENT: Negative for congestion and rhinorrhea.  Eyes: Negative for redness and visual disturbance.  Respiratory: Negative for  shortness of breath and  wheezing.  Cardiovascular: Negative for chest pain and palpitations.  Gastrointestinal: Positive for nausea , vomiting  and  loose stools but negative for abdominal pain Genitourinary: Negative for dysuria and urgency but positive for incontinence Endocrine: Denies polyuria, polyphagia and heat intolerance Musculoskeletal: Negative for myalgias and arthralgias.  Skin: Negative for pallor and wound.  Neurological: Negative for dizziness and headaches but positive for transient confusion  SUBJECTIVE:   VITAL SIGNS: BP (!) 98/55   Pulse 92   Temp 99.1 F (37.3 C) (Oral)   Resp (!) 23   Ht 5\' 7"  (1.702 m)   Wt 189 lb 13.1 oz (86.1 kg)   SpO2 92%   BMI 29.73 kg/m   HEMODYNAMICS:    VENTILATOR SETTINGS:    INTAKE / OUTPUT: No intake/output data recorded.  PHYSICAL EXAMINATION: General: No acute distress Neuro: Alert to person and place, follows basic commands, moves all extremities HEENT: PERRLA, trachea midline, no JVD Cardiovascular: Apical pulse regular, S1-S2, no murmur regurg or gallop, +2 pulses bilaterally, no edema Lungs: Clear to auscultation bilaterally Abdomen: Nondistended, normal bowel sounds in all 4 quadrants Musculoskeletal: Positive range of motion in upper and lower extremities, no joint deformities Skin: Warm and dry  LABS:  BMET Recent Labs  Lab 08/12/17 2347  NA 137  K 3.2*  CL 106  CO2 20*  BUN 15  CREATININE 1.06*  GLUCOSE 154*    Electrolytes Recent Labs  Lab 08/12/17 2347  CALCIUM 8.2*    CBC Recent Labs  Lab 08/12/17 2347  WBC 21.5*  HGB 13.4  HCT 40.9  PLT 196    Coag's No results for input(s): APTT, INR in the last 168 hours.  Sepsis Markers Recent Labs  Lab 08/12/17 2347  LATICACIDVEN 3.5*    ABG No results for input(s): PHART, PCO2ART, PO2ART in the last 168 hours.  Liver Enzymes Recent Labs  Lab 08/12/17 2347  AST 34  ALT 20  ALKPHOS 80  BILITOT 0.7  ALBUMIN 3.4*    Cardiac Enzymes No results  for input(s): TROPONINI, PROBNP in the last 168 hours.  Glucose Recent Labs  Lab 08/13/17 0354  GLUCAP 158*    Imaging No results found.   CULTURES: Blood cultures x2> Urine culture>  ANTIBIOTICS: Vancomycin> Ceftriaxone> Flagyl>  SIGNIFICANT EVENTS: 08/12/2017: Admitted  LINES/TUBES: Peripheral IVs  DISCUSSION: 62 year old female with urinary incontinence presenting with sepsis secondary to UTI, septic shock and acute metabolic encephalopathy secondary to sepsis  ASSESSMENT Urosepsis Septic shock Acute encephalopathy secondary to sepsis Recurrent UTIs Urinary incontinence History of thyroid cancer, hypothyroidism, hypertension, GERD, hyperlipidemia and hepatitis C PLAN Hemodynamic monitoring per ICU protocol IV fluids and as needed pressors to maintain mean arterial blood pressure greater than 65 Antibiotics as above Follow-up cultures Trend procalcitonin and adjust antibiotics Mental status is back to baseline, resume all home medications except for antihypertensives GI and DVT prophylaxis Changes in treatment plan pending clinical course and diagnostics  FAMILY  - Updates: Patient and her son updated at bedside  [facility] ANP-BC Pulmonary and Critical Care Medicine [facility] Pager [phone] or [phone]  NB: This document was prepared using Dragon voice recognition software and may include unintentional dictation errors.    08/13/2017, 4:21 AM

## 2017-08-13 NOTE — ED Notes (Signed)
Admitting MD at bedside.

## 2017-08-13 NOTE — ED Notes (Addendum)
In and out cath performed by Wille Glaser, NT, witnessed by Eliezer Lofts, RN; per MD Dahlia Client order

## 2017-08-13 NOTE — Progress Notes (Signed)
[place] [facility] [place] NAME: Felicia Manning    MR#:  [id number]  DATE OF BIRTH:  1956/01/10  SUBJECTIVE:  CHIEF COMPLAINT: Patient is resting comfortably.  Denies any nausea vomiting or abdominal pain or back pain.  No family members at bedside  REVIEW OF SYSTEMS:  CONSTITUTIONAL: No fever, fatigue or weakness.  EYES: No blurred or double vision.  EARS, NOSE, AND THROAT: No tinnitus or ear pain.  RESPIRATORY: No cough, shortness of breath, wheezing or hemoptysis.  CARDIOVASCULAR: No chest pain, orthopnea, edema.  GASTROINTESTINAL: No nausea, vomiting, diarrhea or abdominal pain.  GENITOURINARY: No dysuria, hematuria.  ENDOCRINE: No polyuria, nocturia,  HEMATOLOGY: No anemia, easy bruising or bleeding SKIN: No rash or lesion. MUSCULOSKELETAL: No joint pain or arthritis.   NEUROLOGIC: No tingling, numbness, weakness.  PSYCHIATRY: No anxiety or depression.   DRUG ALLERGIES:  No Known Allergies  VITALS:  Blood pressure 95/69, pulse 63, temperature 99.2 F (37.3 C), temperature source Oral, resp. rate 18, height 5' 7"  (1.702 m), weight 86.1 kg (189 lb 13.1 oz), SpO2 96 %.  PHYSICAL EXAMINATION:  GENERAL:  62 y.o.-year-old patient lying in the bed with no acute distress.  EYES: Pupils equal, round, reactive to light and accommodation. No scleral icterus. Extraocular muscles intact.  HEENT: Head atraumatic, normocephalic. Oropharynx and nasopharynx clear.  NECK:  Supple, no jugular venous distention. No thyroid enlargement, no tenderness.  LUNGS: Normal breath sounds bilaterally, no wheezing, rales,rhonchi or crepitation. No use of accessory muscles of respiration.  CARDIOVASCULAR: S1, S2 normal. No murmurs, rubs, or gallops.  ABDOMEN: Soft, nontender, nondistended. Bowel sounds present. No organomegaly or mass.  EXTREMITIES: No pedal edema, cyanosis, or clubbing.  NEUROLOGIC: Cranial nerves II through XII are intact. Muscle strength  5/5 in all extremities. Sensation intact. Gait not checked.  PSYCHIATRIC: The patient is alert and oriented x 3.  SKIN: No obvious rash, lesion, or ulcer.    LABORATORY PANEL:   CBC Recent Labs  Lab 08/12/17 2347  WBC 21.5*  HGB 13.4  HCT 40.9  PLT 196   ------------------------------------------------------------------------------------------------------------------  Chemistries  Recent Labs  Lab 08/12/17 2347  08/13/17 0428 08/13/17 1422  NA 137  --  137  --   K 3.2*  --  2.8* 4.1  CL 106  --  112*  --   CO2 20*  --  19*  --   GLUCOSE 154*  --  163*  --   BUN 15  --  13  --   CREATININE 1.06*  --  0.95  --   CALCIUM 8.2*  --  7.0*  --   MG  --    < > 1.1* 2.3  AST 34  --   --   --   ALT 20  --   --   --   ALKPHOS 80  --   --   --   BILITOT 0.7  --   --   --    < > = values in this interval not displayed.   ------------------------------------------------------------------------------------------------------------------  Cardiac Enzymes Recent Labs  Lab 08/13/17 0428  TROPONINI 0.05*   ------------------------------------------------------------------------------------------------------------------  RADIOLOGY:  Ct Head Wo Contrast  Result Date: 08/13/2017 CLINICAL DATA:  Altered mental status, improved with resuscitation. Questioned facial asymmetry on exam today. EXAM: CT HEAD WITHOUT CONTRAST TECHNIQUE: Contiguous axial images were obtained from the base of the skull through the vertex without intravenous contrast. COMPARISON:  None. FINDINGS: Brain: No evidence of acute  infarction, hemorrhage, hydrocephalus, extra-axial collection or mass lesion/mass effect. Vascular: Atherosclerotic calcification. Skull: Normal. Negative for fracture or focal lesion. Sinuses/Orbits: Nonobstructive left inferior frontal sinus osteoma IMPRESSION: No acute finding.  No evidence of infarct. Electronically Signed   By: Monte Fantasia M.D.   On: 08/13/2017 10:24   US  Renal  Result Date: 08/13/2017 CLINICAL DATA:  Sepsis.  Recurrent UTIs. EXAM: RENAL / URINARY TRACT ULTRASOUND COMPLETE COMPARISON:  Body CT 06/18/2017 FINDINGS: Right Kidney: Length: 14.2. Echogenicity within normal limits. No mass or hydronephrosis visualized. Nonobstructive nephrolithiasis. Two benign-appearing cysts in the midpolar region of the right kidney measure 4.9 x 3.0 x 3.5 cm and 5.4 x 3.8 x 5.4 cm. Left Kidney: Length: 13.3 cm. Echogenicity within normal limits. No mass or hydronephrosis visualized. Nonobstructive nephrolithiasis. Two benign-appearing cysts in the midpolar region of the left kidney measure 2.0 x 1.4 x 1.8 cm and 1.5 x 1.5 x 1.2 cm. Bladder: Appears normal for degree of bladder distention. IMPRESSION: No evidence of hydronephrosis. Bilateral nonobstructive nephrolithiasis. Bilateral benign-appearing renal cysts. Electronically Signed   By: Fidela Salisbury M.D.   On: 08/13/2017 11:01   Dg Chest Port 1 View  Result Date: 08/13/2017 CLINICAL DATA:  Acute respiratory failure EXAM: PORTABLE CHEST 1 VIEW COMPARISON:  04/08/2015 FINDINGS: Streaky opacity at the left base where there is mild scarring by abdominal CT last month. No edema, effusion, or pneumothorax. Normal heart size for technique. Thyroidectomy clips. IMPRESSION: Mild opacity at the left base is at least partially from scarring based on priors. Bronchopneumonia is not excluded in the setting of sepsis, consider lateral view. Electronically Signed   By: Monte Fantasia M.D.   On: 08/13/2017 09:30    EKG:   Orders placed or performed during the hospital encounter of 08/12/17  . EKG 12-Lead  . EKG 12-Lead    ASSESSMENT AND PLAN:   This is a 62 year old female admitted for sepsis.   1.  Sepsis: Patient met septic criteria with leukocytosis, elevated lactic acid and tachypnea at the time of admission   etiology is urinary tract infection Was seen and evaluated by urology for nonobstructive nephrolithiasis  recommending conservative management at this time and to continue IV antibiotics and follow-up with cultures.  Outpatient follow-up with urology as recommended after discharge  2.  UTI: Present on admission.  The patient has had multiple urinary tract infections as well as multiple lithotripsy procedures for kidney stones.    Consider repeat CT scan when more stable.  Continue ceftriaxone and vancomycin Renal ultrasound with no hydronephrosis but has revealed nonobstructing nephrolithiasis  3.  Hypothyroidism: TSH normal; continue Synthroid  4.  Hypertension: Hold antihypertensive medications for now with the patient has sepsis associated hypotension.  5.  DVT prophylaxis: Heparin 6.  GI prophylaxis: Pantoprazole per home regimen       All the records are reviewed and case discussed with Care Management/Social Workerr. Management plans discussed with the patient, family and they are in agreement.  CODE STATUS: fc  TOTAL TIME TAKING CARE OF THIS PATIENT: 35  minutes.   POSSIBLE D/C IN 2 DAYS, DEPENDING ON CLINICAL CONDITION.  Note: This dictation was prepared with Dragon dictation along with smaller phrase technology. Any transcriptional errors that result from this process are unintentional.   Nicholes Mango M.D on 08/13/2017 at 3:07 PM  Between 7am to 6pm - Pager - [phone] After 6pm go to [email or web address] - password [email or web address] [facility]  [place] [facility]  Office  [phone]  CC: Primary care  physician; Glendon Axe, MD

## 2017-08-13 NOTE — Consult Note (Signed)
MEDICATION RELATED CONSULT NOTE - INITIAL   Pharmacy Consult for electrolytes Indication: electrolyte disturbance  No Known Allergies  Patient Measurements: Height: 5\' 7"  (170.2 cm) Weight: 189 lb 13.1 oz (86.1 kg) IBW/kg (Calculated) : 61.6 Adjusted Body Weight:   Vital Signs: Temp: 99.1 F (37.3 C) (06/29 0357) Temp Source: Oral (06/29 0357) BP: 101/52 (06/29 0600) Pulse Rate: 86 (06/29 0600) Intake/Output from previous day: 06/28 0701 - 06/29 0700 In: -  Out: 400 [Urine:400] Intake/Output from this shift: No intake/output data recorded.  Labs: Recent Labs    08/12/17 2347 08/13/17 0428  WBC 21.5*  --   HGB 13.4  --   HCT 40.9  --   PLT 196  --   CREATININE 1.06* 0.95  MG  --  1.1*  PHOS  --  <1.0*  ALBUMIN 3.4*  --   PROT 6.7  --   AST 34  --   ALT 20  --   ALKPHOS 80  --   BILITOT 0.7  --    Estimated Creatinine Clearance: 71 mL/min (by C-G formula based on SCr of 0.95 mg/dL).   Microbiology: Recent Results (from the past 720 hour(s))  Blood culture (routine x 2)     Status: None (Preliminary result)   Collection Time: 08/13/17  2:25 AM  Result Value Ref Range Status   Specimen Description BLOOD LEFT WRIST  Final   Special Requests   Final    BOTTLES DRAWN AEROBIC AND ANAEROBIC Blood Culture adequate volume   Culture   Final    NO GROWTH < 12 HOURS Performed at [facility], [address] [place], [place] [address]    Report Status PENDING  Incomplete  Blood culture (routine x 2)     Status: None (Preliminary result)   Collection Time: 08/13/17  2:33 AM  Result Value Ref Range Status   Specimen Description BLOOD RIGHT WRIST  Final   Special Requests   Final    BOTTLES DRAWN AEROBIC AND ANAEROBIC Blood Culture results may not be optimal due to an excessive volume of blood received in culture bottles Performed at [facility], [address] [place], [place] [address]    Culture PENDING  Incomplete   Report Status PENDING   Incomplete  MRSA PCR Screening     Status: None   Collection Time: 08/13/17  4:48 AM  Result Value Ref Range Status   MRSA by PCR NEGATIVE NEGATIVE Final    Comment:        The GeneXpert MRSA Assay (FDA approved for NASAL specimens only), is one component of a comprehensive MRSA colonization surveillance program. It is not intended to diagnose MRSA infection nor to guide or monitor treatment for MRSA infections. Performed at [facility], [address] [place], [place] [address]     Medical History: Past Medical History:  Diagnosis Date  . Barrett's esophagus   . Dysrhythmia    stress related at work.  Marland Kitchen GERD (gastroesophageal reflux disease)   . Hepatitis C 2008  . Hyperlipidemia   . Hypertension   . Hypothyroidism   . Nephrolithiasis   . Osteoporosis   . Thyroid cancer ([place]) 2012  . Thyroid cancer ([place])     Medications:  Scheduled:  . docusate sodium  100 mg Oral BID  . heparin  5,000 Units Subcutaneous Q8H  . levothyroxine  150 mcg Oral Q2200  . phosphorus  500 mg Oral BID    Assessment: Pt admitted with confusion, agitation,  and diarrhea. Found to have phos <1, Mg 1.1 and K 2.8 this AM. Pharmacy consulted for electrolyte management.  Goal of Therapy:  Normalization of electrolytes  Plan:  ICU NP ordered Kphos 21mmol IV x 2 doses and Mg 2g IV. I will give another 2g IV of Mg. Will recheck K and Mg at 1400 (second K phos will not be finished infusing at that time) and then all electrolytes again in the AM.  Ramond Dial, Pharm.D, BCPS Clinical Pharmacist 08/13/2017,10:20 AM

## 2017-08-14 ENCOUNTER — Inpatient Hospital Stay: Payer: 59

## 2017-08-14 LAB — BLOOD CULTURE ID PANEL (REFLEXED)

## 2017-08-14 LAB — BASIC METABOLIC PANEL
Anion gap: 7 (ref 5–15)
BUN: 7 mg/dL (ref 6–20)
CO2: 20 mmol/L — ABNORMAL LOW (ref 22–32)
Calcium: 7.4 mg/dL — ABNORMAL LOW (ref 8.9–10.3)
Chloride: 112 mmol/L — ABNORMAL HIGH (ref 98–111)
Creatinine, Ser: 0.58 mg/dL (ref 0.44–1.00)
GFR calc Af Amer: >60 mL/min (ref >60)
GFR calc non Af Amer: >60 mL/min (ref >60)
Glucose, Bld: 121 mg/dL — ABNORMAL HIGH (ref 70–99)
Potassium: 4.1 mmol/L (ref 3.5–5.1)
Sodium: 139 mmol/L (ref 135–145)

## 2017-08-14 LAB — MAGNESIUM: Magnesium: 2.1 mg/dL (ref 1.7–2.4)

## 2017-08-14 LAB — PROCALCITONIN: Procalcitonin: 28.11 ng/mL

## 2017-08-14 LAB — PHOSPHORUS: Phosphorus: 2.3 mg/dL — ABNORMAL LOW (ref 2.5–4.6)

## 2017-08-14 MED ORDER — NICOTINE 21 MG/24HR TD PT24
21.0000 mg | MEDICATED_PATCH | Freq: Every day | TRANSDERMAL | Status: DC
  Administered 2017-08-14: 21 mg via TRANSDERMAL
  Filled 2017-08-14 (×2): qty 1

## 2017-08-14 MED ORDER — IPRATROPIUM-ALBUTEROL 0.5-2.5 (3) MG/3ML IN SOLN
3.0000 mL | Freq: Four times a day (QID) | RESPIRATORY_TRACT | Status: DC | PRN
  Administered 2017-08-14 (×2): 3 mL via RESPIRATORY_TRACT
  Filled 2017-08-14 (×2): qty 3

## 2017-08-14 MED ORDER — SODIUM CHLORIDE 0.9 % IV SOLN
INTRAVENOUS | Status: DC
  Administered 2017-08-14 – 2017-08-15 (×3): via INTRAVENOUS
  Filled 2017-08-14 (×10): qty 1000

## 2017-08-14 MED ORDER — PNEUMOCOCCAL VAC POLYVALENT 25 MCG/0.5ML IJ INJ
0.5000 mL | INJECTION | INTRAMUSCULAR | Status: AC
  Administered 2017-08-15: 0.5 mL via INTRAMUSCULAR
  Filled 2017-08-14: qty 0.5

## 2017-08-14 MED ORDER — POTASSIUM CHLORIDE IN NACL 40-0.9 MEQ/L-% IV SOLN
INTRAVENOUS | Status: DC
  Administered 2017-08-14: 125 mL/h via INTRAVENOUS
  Filled 2017-08-14 (×4): qty 1000

## 2017-08-14 MED ORDER — K PHOS MONO-SOD PHOS DI & MONO 155-852-130 MG PO TABS
500.0000 mg | ORAL_TABLET | ORAL | Status: AC
  Administered 2017-08-14: 500 mg via ORAL
  Filled 2017-08-14 (×2): qty 2

## 2017-08-14 MED ORDER — SODIUM CHLORIDE 0.9 % IV SOLN
1.0000 g | Freq: Three times a day (TID) | INTRAVENOUS | Status: DC
  Administered 2017-08-14 – 2017-08-15 (×2): 1 g via INTRAVENOUS
  Filled 2017-08-14 (×5): qty 1

## 2017-08-14 NOTE — Consult Note (Signed)
Pharmacy Antibiotic Note  Felicia Manning is a 62 y.o. female admitted on 08/12/2017 with bacteremia. BCID showing E. Coli.  Pharmacy has been consulted for Meropenem dosing.  Plan: Ceftriaxone has been discontinued.  Will start Meropenem 1g IV every 8 hours.  F/U on Cx and sensitivities and narrow therapy accordingly   Height: 5\' 6"  (167.6 cm) Weight: 136 lb 0.4 oz (61.7 kg) IBW/kg (Calculated) : 59.3  Temp (24hrs), Avg:99.5 F (37.5 C), Min:97.8 F (36.6 C), Max:101.1 F (38.4 C)  Recent Labs  Lab 08/12/17 2347 08/13/17 0428 08/14/17 0715  WBC 21.5*  --   --   CREATININE 1.06* 0.95 0.58  LATICACIDVEN 3.5* 1.3  --     Estimated Creatinine Clearance: 70 mL/min (by C-G formula based on SCr of 0.58 mg/dL).    No Known Allergies  Antimicrobials this admission: 6/29 vancomycin >> 6/30 6/29 metronidazole>> 6/30 6/29 ceftriaxone>> 6/30  6/30 meropenem>>   Microbiology results: 6/29 BCID: E. Coli 6/29  UCx: E. Coli  6/29 MRSA PCR: negative   Thank you for allowing pharmacy to be a part of this patient's care.  Pernell Dupre, PharmD, BCPS Clinical Pharmacist 08/14/2017 3:28 PM

## 2017-08-14 NOTE — Progress Notes (Signed)
[place] at [place] NAME: Felicia Manning    MR#:  [id number]  DATE OF BIRTH:  01-24-1956  SUBJECTIVE:  CHIEF COMPLAINT: Patient is resting comfortably.  Denies any nausea vomiting or abdominal pain or back pain.  No new complaints  REVIEW OF SYSTEMS:  CONSTITUTIONAL: No fever, fatigue or weakness.  EYES: No blurred or double vision.  EARS, NOSE, AND THROAT: No tinnitus or ear pain.  RESPIRATORY: No cough, shortness of breath, wheezing or hemoptysis.  CARDIOVASCULAR: No chest pain, orthopnea, edema.  GASTROINTESTINAL: No nausea, vomiting, diarrhea or abdominal pain.  GENITOURINARY: No dysuria, hematuria.  ENDOCRINE: No polyuria, nocturia,  HEMATOLOGY: No anemia, easy bruising or bleeding SKIN: No rash or lesion. MUSCULOSKELETAL: No joint pain or arthritis.   NEUROLOGIC: No tingling, numbness, weakness.  PSYCHIATRY: No anxiety or depression.   DRUG ALLERGIES:  No Known Allergies  VITALS:  Blood pressure 128/71, pulse 67, temperature 98.4 F (36.9 C), temperature source Oral, resp. rate (!) 26, height 5' 6"  (1.676 m), weight 61.7 kg (136 lb 0.4 oz), SpO2 94 %.  PHYSICAL EXAMINATION:  GENERAL:  62 y.o.-year-old patient lying in the bed with no acute distress.  EYES: Pupils equal, round, reactive to light and accommodation. No scleral icterus. Extraocular muscles intact.  HEENT: Head atraumatic, normocephalic. Oropharynx and nasopharynx clear.  NECK:  Supple, no jugular venous distention. No thyroid enlargement, no tenderness.  LUNGS: Normal breath sounds bilaterally, no wheezing, rales,rhonchi or crepitation. No use of accessory muscles of respiration.  CARDIOVASCULAR: S1, S2 normal. No murmurs, rubs, or gallops.  ABDOMEN: Soft, nontender, nondistended. Bowel sounds present. No organomegaly or mass.  EXTREMITIES: No pedal edema, cyanosis, or clubbing.  NEUROLOGIC: Cranial nerves II through XII are intact. Muscle strength 5/5 in  all extremities. Sensation intact. Gait not checked.  PSYCHIATRIC: The patient is alert and oriented x 3.  SKIN: No obvious rash, lesion, or ulcer.    LABORATORY PANEL:   CBC Recent Labs  Lab 08/12/17 2347  WBC 21.5*  HGB 13.4  HCT 40.9  PLT 196   ------------------------------------------------------------------------------------------------------------------  Chemistries  Recent Labs  Lab 08/12/17 2347  08/14/17 0715  NA 137   < > 139  K 3.2*   < > 4.1  CL 106   < > 112*  CO2 20*   < > 20*  GLUCOSE 154*   < > 121*  BUN 15   < > 7  CREATININE 1.06*   < > 0.58  CALCIUM 8.2*   < > 7.4*  MG  --    < > 2.1  AST 34  --   --   ALT 20  --   --   ALKPHOS 80  --   --   BILITOT 0.7  --   --    < > = values in this interval not displayed.   ------------------------------------------------------------------------------------------------------------------  Cardiac Enzymes Recent Labs  Lab 08/13/17 0428  TROPONINI 0.05*   ------------------------------------------------------------------------------------------------------------------  RADIOLOGY:  Ct Head Wo Contrast  Result Date: 08/13/2017 CLINICAL DATA:  Altered mental status, improved with resuscitation. Questioned facial asymmetry on exam today. EXAM: CT HEAD WITHOUT CONTRAST TECHNIQUE: Contiguous axial images were obtained from the base of the skull through the vertex without intravenous contrast. COMPARISON:  None. FINDINGS: Brain: No evidence of acute infarction, hemorrhage, hydrocephalus, extra-axial collection or mass lesion/mass effect. Vascular: Atherosclerotic calcification. Skull: Normal. Negative for fracture or focal lesion. Sinuses/Orbits: Nonobstructive left inferior frontal sinus osteoma IMPRESSION: No acute  finding.  No evidence of infarct. Electronically Signed   By: Monte Fantasia M.D.   On: 08/13/2017 10:24   US Renal  Result Date: 08/13/2017 CLINICAL DATA:  Sepsis.  Recurrent UTIs. EXAM: RENAL /  URINARY TRACT ULTRASOUND COMPLETE COMPARISON:  Body CT 06/18/2017 FINDINGS: Right Kidney: Length: 14.2. Echogenicity within normal limits. No mass or hydronephrosis visualized. Nonobstructive nephrolithiasis. Two benign-appearing cysts in the midpolar region of the right kidney measure 4.9 x 3.0 x 3.5 cm and 5.4 x 3.8 x 5.4 cm. Left Kidney: Length: 13.3 cm. Echogenicity within normal limits. No mass or hydronephrosis visualized. Nonobstructive nephrolithiasis. Two benign-appearing cysts in the midpolar region of the left kidney measure 2.0 x 1.4 x 1.8 cm and 1.5 x 1.5 x 1.2 cm. Bladder: Appears normal for degree of bladder distention. IMPRESSION: No evidence of hydronephrosis. Bilateral nonobstructive nephrolithiasis. Bilateral benign-appearing renal cysts. Electronically Signed   By: Fidela Salisbury M.D.   On: 08/13/2017 11:01   Dg Chest Port 1 View  Result Date: 08/14/2017 CLINICAL DATA:  Acute respiratory failure EXAM: PORTABLE CHEST 1 VIEW COMPARISON:  Chest radiograph 08/13/2017 FINDINGS: The lungs are well inflated. The cardiomediastinal silhouette size is normal. There is calcific aortic atherosclerosis. There is atelectasis at the left lung base. No focal consolidation or pulmonary edema. There is no pleural effusion or pneumothorax. IMPRESSION: No active disease. Electronically Signed   By: Ulyses Jarred M.D.   On: 08/14/2017 03:59   Dg Chest Port 1 View  Result Date: 08/13/2017 CLINICAL DATA:  Acute respiratory failure EXAM: PORTABLE CHEST 1 VIEW COMPARISON:  04/08/2015 FINDINGS: Streaky opacity at the left base where there is mild scarring by abdominal CT last month. No edema, effusion, or pneumothorax. Normal heart size for technique. Thyroidectomy clips. IMPRESSION: Mild opacity at the left base is at least partially from scarring based on priors. Bronchopneumonia is not excluded in the setting of sepsis, consider lateral view. Electronically Signed   By: Monte Fantasia M.D.   On:  08/13/2017 09:30    EKG:   Orders placed or performed during the hospital encounter of 08/12/17  . EKG 12-Lead  . EKG 12-Lead    ASSESSMENT AND PLAN:   This is a 62 year old female admitted for sepsis.   1.  Sepsis: Patient met septic criteria with leukocytosis, elevated lactic acid and tachypnea at the time of admission   etiology is urinary tract infection Urine culture with greater than 100,000 colonies of E. coli sensitivities are pending Was seen and evaluated by urology for nonobstructive nephrolithiasis recommending conservative management at this time and to continue IV antibiotics and follow-up with cultures.  Outpatient follow-up with urology as recommended after discharge  2.  UTI: Present on admission.  The patient has had multiple urinary tract infections as well as multiple lithotripsy procedures for kidney stones.    Consider repeat CT scan when more stable.  Continue ceftriaxone and discontinue vancomycin as MRSA PCR negative Renal ultrasound with no hydronephrosis but has revealed nonobstructing nephrolithiasis  3.  Hypothyroidism: TSH normal; continue Synthroid  4.  Hypertension: Hold antihypertensive medications for now with the patient has sepsis associated hypotension.  5.  DVT prophylaxis: Heparin 6.  GI prophylaxis: Pantoprazole per home regimen       All the records are reviewed and case discussed with Care Management/Social Workerr. Management plans discussed with the patient, family and they are in agreement.  CODE STATUS: fc  TOTAL TIME TAKING CARE OF THIS PATIENT: 35  minutes.   POSSIBLE D/C  IN 1 DAYS, DEPENDING ON CLINICAL CONDITION.  Note: This dictation was prepared with Dragon dictation along with smaller phrase technology. Any transcriptional errors that result from this process are unintentional.   Nicholes Mango M.D on 08/14/2017 at 12:46 PM  Between 7am to 6pm - Pager - [phone] After 6pm go to [email or web address] - password [email or web address]  [facility]  [place] [facility]  Office  [phone]  CC: Primary care physician; Glendon Axe, MD

## 2017-08-14 NOTE — Progress Notes (Signed)
PHARMACY - PHYSICIAN COMMUNICATION CRITICAL VALUE ALERT - BLOOD CULTURE IDENTIFICATION (BCID)  Felicia Manning is an 62 y.o. female who presented to [facility] on 08/12/2017 with a chief complaint of sepsis secondary to Ucx.   Assessment:  BCID showing E. Coli   Name of physician (or Provider) Contacted: Dr. Margaretmary Eddy   Current antibiotics: Ceftriaxone   Changes to prescribed antibiotics recommended:  Recommendations accepted by provider  Results for orders placed or performed during the hospital encounter of 08/12/17  Blood Culture ID Panel (Reflexed) (Collected: 08/13/2017  2:25 AM)  Result Value Ref Range   Enterococcus species NOT DETECTED NOT DETECTED   Listeria monocytogenes NOT DETECTED NOT DETECTED   Staphylococcus species NOT DETECTED NOT DETECTED   Staphylococcus aureus NOT DETECTED NOT DETECTED   Streptococcus species NOT DETECTED NOT DETECTED   Streptococcus agalactiae NOT DETECTED NOT DETECTED   Streptococcus pneumoniae NOT DETECTED NOT DETECTED   Streptococcus pyogenes NOT DETECTED NOT DETECTED   Acinetobacter baumannii NOT DETECTED NOT DETECTED   Enterobacteriaceae species DETECTED (A) NOT DETECTED   Enterobacter cloacae complex NOT DETECTED NOT DETECTED   Escherichia coli DETECTED (A) NOT DETECTED   Klebsiella oxytoca NOT DETECTED NOT DETECTED   Klebsiella pneumoniae NOT DETECTED NOT DETECTED   Proteus species NOT DETECTED NOT DETECTED   Serratia marcescens NOT DETECTED NOT DETECTED   Carbapenem resistance NOT DETECTED NOT DETECTED   Haemophilus influenzae NOT DETECTED NOT DETECTED   Neisseria meningitidis NOT DETECTED NOT DETECTED   Pseudomonas aeruginosa NOT DETECTED NOT DETECTED   Candida albicans NOT DETECTED NOT DETECTED   Candida glabrata NOT DETECTED NOT DETECTED   Candida krusei NOT DETECTED NOT DETECTED   Candida parapsilosis NOT DETECTED NOT DETECTED   Candida tropicalis NOT DETECTED NOT DETECTED    Pernell Dupre, PharmD, BCPS Clinical  Pharmacist 08/14/2017 3:31 PM

## 2017-08-14 NOTE — Consult Note (Signed)
[place] for electrolytes Indication: electrolyte disturbance  No Known Allergies  Patient Measurements: Height: 5\' 7"  (170.2 cm) Weight: 193 lb 2 oz (87.6 kg) IBW/kg (Calculated) : 61.6 Adjusted Body Weight:   Vital Signs: Temp: 101.1 F (38.4 C) (06/30 0400) Temp Source: Oral (06/30 0400) BP: 125/74 (06/30 0700) Pulse Rate: 75 (06/30 0700) Intake/Output from previous day: 06/29 0701 - 06/30 0700 In: 4605.5 [I.V.:2972.9; IV Piggyback:1632.6] Out: 2700 [Urine:2700] Intake/Output from this shift: No intake/output data recorded.  Labs: Recent Labs    08/12/17 2347 08/13/17 0428 08/13/17 1422 08/14/17 0715  WBC 21.5*  --   --   --   HGB 13.4  --   --   --   HCT 40.9  --   --   --   PLT 196  --   --   --   CREATININE 1.06* 0.95  --  0.58  MG  --  1.1* 2.3 2.1  PHOS  --  <1.0*  --  2.3*  ALBUMIN 3.4*  --   --   --   PROT 6.7  --   --   --   AST 34  --   --   --   ALT 20  --   --   --   ALKPHOS 80  --   --   --   BILITOT 0.7  --   --   --    Estimated Creatinine Clearance: 85 mL/min (by C-G formula based on SCr of 0.58 mg/dL).   Microbiology: Recent Results (from the past 720 hour(s))  Blood culture (routine x 2)     Status: None (Preliminary result)   Collection Time: 08/13/17  2:25 AM  Result Value Ref Range Status   Specimen Description BLOOD LEFT WRIST  Final   Special Requests   Final    BOTTLES DRAWN AEROBIC AND ANAEROBIC Blood Culture adequate volume   Culture   Final    NO GROWTH 1 DAY Performed at [facility], [address] [place], [place] [address]    Report Status PENDING  Incomplete  Blood culture (routine x 2)     Status: None (Preliminary result)   Collection Time: 08/13/17  2:33 AM  Result Value Ref Range Status   Specimen Description BLOOD RIGHT WRIST  Final   Special Requests   Final    BOTTLES DRAWN AEROBIC AND ANAEROBIC Blood Culture results may not be optimal due to an excessive  volume of blood received in culture bottles   Culture   Final    NO GROWTH 1 DAY Performed at [facility], [address], [place], [place] [address]    Report Status PENDING  Incomplete  MRSA PCR Screening     Status: None   Collection Time: 08/13/17  4:48 AM  Result Value Ref Range Status   MRSA by PCR NEGATIVE NEGATIVE Final    Comment:        The GeneXpert MRSA Assay (FDA approved for NASAL specimens only), is one component of a comprehensive MRSA colonization surveillance program. It is not intended to diagnose MRSA infection nor to guide or monitor treatment for MRSA infections. Performed at [facility], [address] [place],  [address]     Medical History: Past Medical History:  Diagnosis Date  . Barrett's esophagus   . Dysrhythmia    stress related at work.  Marland Kitchen GERD (gastroesophageal reflux disease)   . Hepatitis C 2008  .  Hyperlipidemia   . Hypertension   . Hypothyroidism   . Nephrolithiasis   . Osteoporosis   . Thyroid cancer ([place]) 2012  . Thyroid cancer ([place])     Medications:  Scheduled:  . docusate sodium  100 mg Oral BID  . heparin  5,000 Units Subcutaneous Q8H  . levothyroxine  150 mcg Oral Q2200    Assessment: Pt admitted with confusion, agitation, and diarrhea. Found to have phos <1, Mg 1.1 and K 2.8 this AM. Pharmacy consulted for electrolyte management.  Goal of Therapy:  Normalization of electrolytes  Plan:  ICU NP ordered Kphos 40mmol IV x 2 doses and Mg 2g IV. I will give another 2g IV of Mg. Will recheck K and Mg at 1400 (second K phos will not be finished infusing at that time) and then all electrolytes again in the AM.  6/29 1422: K=4.1, Mag=2.3. No supplemental doses at this time. F/u labs in am. Note: Patient on IVF NS w/ KCL 40 meq/L @ 125 ml/hr  6/30 K 4.1, Mag 2.1, Phos 2.3, Scr 0.58  Patient on IVF NS w/ KCL 40 meq/L @ 125 ml/hr. Will order KPhos neutral PO q4h x 2 doses.   Chinita Greenland  PharmD Clinical Pharmacist 08/14/2017

## 2017-08-14 NOTE — Progress Notes (Signed)
Follow up - Critical Care Medicine Note  Patient Details:    Felicia Manning is an 62 y.o. female with a past medical history remarkable for renal calculi, history of urinary tract infections and diverticulitis, presented to the emergency department with complaints of sudden onset of confusion, agitation, diarrhea. She was admitted to the intensive care unit for treatment of urosepsis and altered mental status  Lines, Airways, Drains: Ureteral Drain/Stent Right ureter 6 Fr. (Active)     Ureteral Drain/Stent Right ureter 6 Fr. (Active)     Ureteral Drain/Stent Left ureter 6 Fr. (Active)     Ureteral Drain/Stent Right ureter 6 Fr. (Active)     External Urinary Catheter (Active)  Collection Container Dedicated Suction Canister 08/14/2017  3:56 AM  Securement Method Securing device (Describe) 08/14/2017  3:56 AM    Anti-infectives:  Anti-infectives (From admission, onward)   Start     Dose/Rate Route Frequency Ordered Stop   08/14/17 0200  cefTRIAXone (ROCEPHIN) 1 g in sodium chloride 0.9 % 100 mL IVPB     1 g 200 mL/hr over 30 Minutes Intravenous Every 24 hours 08/13/17 0356     08/13/17 0900  vancomycin (VANCOCIN) IVPB 1000 mg/200 mL premix     1,000 mg 200 mL/hr over 60 Minutes Intravenous Every 18 hours 08/13/17 0306     08/13/17 0645  metroNIDAZOLE (FLAGYL) IVPB 500 mg  Status:  Discontinued     500 mg 100 mL/hr over 60 Minutes Intravenous Every 8 hours 08/13/17 0632 08/13/17 3557   08/13/17 0645  metroNIDAZOLE (FLAGYL) IVPB 500 mg     500 mg 100 mL/hr over 60 Minutes Intravenous Every 8 hours 08/13/17 3220 08/18/17 0644   08/13/17 0300  vancomycin (VANCOCIN) IVPB 1000 mg/200 mL premix     1,000 mg 200 mL/hr over 60 Minutes Intravenous  Once 08/13/17 0300     08/13/17 0200  cefTRIAXone (ROCEPHIN) 1 g in sodium chloride 0.9 % 100 mL IVPB     1 g 200 mL/hr over 30 Minutes Intravenous  Once 08/13/17 0155 08/13/17 2542      Microbiology: Results for orders placed or performed  during the hospital encounter of 08/12/17  Urine Culture     Status: Abnormal (Preliminary result)   Collection Time: 08/13/17  1:25 AM  Result Value Ref Range Status   Specimen Description   Final    URINE, CATHETERIZED Performed at [facility], [address] [place], [place] [address]    Special Requests   Final    NONE Performed at [facility], [address] [place], [place] [address]    Culture >=100,000 COLONIES/mL GRAM NEGATIVE RODS (A)  Final   Report Status PENDING  Incomplete  Blood culture (routine x 2)     Status: None (Preliminary result)   Collection Time: 08/13/17  2:25 AM  Result Value Ref Range Status   Specimen Description BLOOD LEFT WRIST  Final   Special Requests   Final    BOTTLES DRAWN AEROBIC AND ANAEROBIC Blood Culture adequate volume   Culture   Final    NO GROWTH 1 DAY Performed at [facility], [address] [place], [place] [address]    Report Status PENDING  Incomplete  Blood culture (routine x 2)     Status: None (Preliminary result)   Collection Time: 08/13/17  2:33 AM  Result Value Ref Range Status   Specimen Description BLOOD RIGHT WRIST  Final   Special Requests   Final  BOTTLES DRAWN AEROBIC AND ANAEROBIC Blood Culture results may not be optimal due to an excessive volume of blood received in culture bottles   Culture   Final    NO GROWTH 1 DAY Performed at [facility], [address] [place], [place] [address]    Report Status PENDING  Incomplete  MRSA PCR Screening     Status: None   Collection Time: 08/13/17  4:48 AM  Result Value Ref Range Status   MRSA by PCR NEGATIVE NEGATIVE Final    Comment:        The GeneXpert MRSA Assay (FDA approved for NASAL specimens only), is one component of a comprehensive MRSA colonization surveillance program. It is not intended to diagnose MRSA infection nor to guide or monitor treatment for MRSA infections. Performed at [facility],  [address] [place], [place] [address]     Studies: Ct Head Wo Contrast  Result Date: 08/13/2017 CLINICAL DATA:  Altered mental status, improved with resuscitation. Questioned facial asymmetry on exam today. EXAM: CT HEAD WITHOUT CONTRAST TECHNIQUE: Contiguous axial images were obtained from the base of the skull through the vertex without intravenous contrast. COMPARISON:  None. FINDINGS: Brain: No evidence of acute infarction, hemorrhage, hydrocephalus, extra-axial collection or mass lesion/mass effect. Vascular: Atherosclerotic calcification. Skull: Normal. Negative for fracture or focal lesion. Sinuses/Orbits: Nonobstructive left inferior frontal sinus osteoma IMPRESSION: No acute finding.  No evidence of infarct. Electronically Signed   By: Monte Fantasia M.D.   On: 08/13/2017 10:24   US Renal  Result Date: 08/13/2017 CLINICAL DATA:  Sepsis.  Recurrent UTIs. EXAM: RENAL / URINARY TRACT ULTRASOUND COMPLETE COMPARISON:  Body CT 06/18/2017 FINDINGS: Right Kidney: Length: 14.2. Echogenicity within normal limits. No mass or hydronephrosis visualized. Nonobstructive nephrolithiasis. Two benign-appearing cysts in the midpolar region of the right kidney measure 4.9 x 3.0 x 3.5 cm and 5.4 x 3.8 x 5.4 cm. Left Kidney: Length: 13.3 cm. Echogenicity within normal limits. No mass or hydronephrosis visualized. Nonobstructive nephrolithiasis. Two benign-appearing cysts in the midpolar region of the left kidney measure 2.0 x 1.4 x 1.8 cm and 1.5 x 1.5 x 1.2 cm. Bladder: Appears normal for degree of bladder distention. IMPRESSION: No evidence of hydronephrosis. Bilateral nonobstructive nephrolithiasis. Bilateral benign-appearing renal cysts. Electronically Signed   By: Fidela Salisbury M.D.   On: 08/13/2017 11:01   Dg Chest Port 1 View  Result Date: 08/14/2017 CLINICAL DATA:  Acute respiratory failure EXAM: PORTABLE CHEST 1 VIEW COMPARISON:  Chest radiograph 08/13/2017 FINDINGS: The lungs are well  inflated. The cardiomediastinal silhouette size is normal. There is calcific aortic atherosclerosis. There is atelectasis at the left lung base. No focal consolidation or pulmonary edema. There is no pleural effusion or pneumothorax. IMPRESSION: No active disease. Electronically Signed   By: Ulyses Jarred M.D.   On: 08/14/2017 03:59   Dg Chest Port 1 View  Result Date: 08/13/2017 CLINICAL DATA:  Acute respiratory failure EXAM: PORTABLE CHEST 1 VIEW COMPARISON:  04/08/2015 FINDINGS: Streaky opacity at the left base where there is mild scarring by abdominal CT last month. No edema, effusion, or pneumothorax. Normal heart size for technique. Thyroidectomy clips. IMPRESSION: Mild opacity at the left base is at least partially from scarring based on priors. Bronchopneumonia is not excluded in the setting of sepsis, consider lateral view. Electronically Signed   By: Monte Fantasia M.D.   On: 08/13/2017 09:30    Consults: Treatment Team:  Lucas Mallow, MD   Subjective:  Overnight Issues: overnight patient has done quite well. Mental status has resolved, CT scan of the head was negative, renal ultrasound was performed which revealed renal calculi, appreciate urology consultation  Objective:  Vital signs for last 24 hours: Temp:  [99.2 F (37.3 C)-101.1 F (38.4 C)] 101.1 F (38.4 C) (06/30 0400) Pulse Rate:  [63-94] 75 (06/30 0700) Resp:  [18-26] 18 (06/30 0700) BP: (93-152)/(47-89) 125/74 (06/30 0700) SpO2:  [93 %-100 %] 96 % (06/30 0700) Weight:  [193 lb 2 oz (87.6 kg)] 193 lb 2 oz (87.6 kg) (06/30 0500)  Hemodynamic parameters for last 24 hours:    Intake/Output from previous day: 06/29 0701 - 06/30 0700 In: 4605.5 [I.V.:2972.9; IV Piggyback:1632.6] Out: 2700 [Urine:2700]  Intake/Output this shift: No intake/output data recorded.  Vent settings for last 24 hours:    Physical Exam:  Vital signs: Please see the above listed vital signs HEENT: No oral lesions, trachea  midline, no accessory muscle utilization Cardiovascular: Regular rate and rhythm Pulmonary: Clear to auscultation Abdominal: Positive bowel sounds, soft exam Extremity: No clubbing cyanosis or edema Neurologic: Moves all extremities  Assessment/Plan:   Urosepsis. Hypotension resolved. Patient is awake alert and significantly improved mental status. Renal ultrasound revealed nonobstructing calculi. Urine culture revealed gram-negative rods patient is on Rocephin and vancomycin. We'll stop vancomycin and Flagyl. Appreciate Urology consultation and will follow recommendations.  Hypophosphatemia. Has been replaced  Non-anion gap metabolic acidosis. Stable  At this point patient is stable for floor transfer    Malikhi Ogan 08/14/2017  *Care during the described time interval was provided by me and/or other providers on the critical care team.  I have reviewed this patient's available data, including medical history, events of note, physical examination and test results as part of my evaluation.

## 2017-08-14 NOTE — Progress Notes (Signed)
ANTIBIOTIC CONSULT NOTE   Pharmacy Consult for Vancomycin  Indication: sepsis  No Known Allergies  Patient Measurements: Height: 5\' 7"  (170.2 cm) Weight: 193 lb 2 oz (87.6 kg) IBW/kg (Calculated) : 61.6 Adjusted Body Weight: 67.8 kg   Vital Signs: Temp: 101.1 F (38.4 C) (06/30 0400) Temp Source: Oral (06/30 0400) BP: 125/74 (06/30 0700) Pulse Rate: 75 (06/30 0700) Intake/Output from previous day: 06/29 0701 - 06/30 0700 In: 4605.5 [I.V.:2972.9; IV Piggyback:1632.6] Out: 2700 [Urine:2700] Intake/Output from this shift: No intake/output data recorded.  Labs: Recent Labs    08/12/17 2347 08/13/17 0428 08/14/17 0715  WBC 21.5*  --   --   HGB 13.4  --   --   PLT 196  --   --   CREATININE 1.06* 0.95 0.58   Estimated Creatinine Clearance: 85 mL/min (by C-G formula based on SCr of 0.58 mg/dL). No results for input(s): VANCOTROUGH, VANCOPEAK, VANCORANDOM, GENTTROUGH, GENTPEAK, GENTRANDOM, TOBRATROUGH, TOBRAPEAK, TOBRARND, AMIKACINPEAK, AMIKACINTROU, AMIKACIN in the last 72 hours.   Microbiology: Recent Results (from the past 720 hour(s))  Urine Culture     Status: Abnormal (Preliminary result)   Collection Time: 08/13/17  1:25 AM  Result Value Ref Range Status   Specimen Description   Final    URINE, CATHETERIZED Performed at [facility], [address], [place], [place] [address]    Special Requests   Final    NONE Performed at [facility], [address], [place], [place] [address]    Culture >=100,000 COLONIES/mL GRAM NEGATIVE RODS (A)  Final   Report Status PENDING  Incomplete  Blood culture (routine x 2)     Status: None (Preliminary result)   Collection Time: 08/13/17  2:25 AM  Result Value Ref Range Status   Specimen Description BLOOD LEFT WRIST  Final   Special Requests   Final    BOTTLES DRAWN AEROBIC AND ANAEROBIC Blood Culture adequate volume   Culture   Final    NO GROWTH 1 DAY Performed at [facility], [address], [place], [place] [address]    Report Status PENDING  Incomplete  Blood culture (routine x 2)     Status: None (Preliminary result)   Collection Time: 08/13/17  2:33 AM  Result Value Ref Range Status   Specimen Description BLOOD RIGHT WRIST  Final   Special Requests   Final    BOTTLES DRAWN AEROBIC AND ANAEROBIC Blood Culture results may not be optimal due to an excessive volume of blood received in culture bottles   Culture   Final    NO GROWTH 1 DAY Performed at [facility], [address], [place], [place] [address]    Report Status PENDING  Incomplete  MRSA PCR Screening     Status: None   Collection Time: 08/13/17  4:48 AM  Result Value Ref Range Status   MRSA by PCR NEGATIVE NEGATIVE Final    Comment:        The GeneXpert MRSA Assay (FDA approved for NASAL specimens only), is one component of a comprehensive MRSA colonization surveillance program. It is not intended to diagnose MRSA infection nor to guide or monitor treatment for MRSA infections. Performed at [facility], [address], [place], [place] [address]     Medical History: Past Medical History:  Diagnosis Date  . Barrett's esophagus   . Dysrhythmia    stress related at work.  Marland Kitchen GERD (gastroesophageal reflux disease)   . Hepatitis C 2008  .  Hyperlipidemia   . Hypertension   . Hypothyroidism   . Nephrolithiasis   . Osteoporosis   . Thyroid cancer ([place]) 2012  . Thyroid cancer ([place])     Medications:  Medications Prior to Admission  Medication Sig Dispense Refill Last Dose  . amLODipine (NORVASC) 10 MG tablet Take 10 mg by mouth daily.   06/18/2017 at 0800  . cholecalciferol (VITAMIN D) 1000 units tablet Take 2,000 Units by mouth daily.   06/18/2017 at 0800  . ciclopirox (PENLAC) 8 % solution Apply 1 application topically at bedtime. Apply over nail and surrounding skin  11   . levothyroxine (SYNTHROID, LEVOTHROID) 150 MCG tablet Take 150 mcg by mouth daily at 10 pm.    06/17/2017  at 2200  . losartan (COZAAR) 100 MG tablet Take 100 mg by mouth every morning.   06/18/2017 at 0800  . metoprolol succinate (TOPROL-XL) 25 MG 24 hr tablet Take 25 mg by mouth every morning.   1 06/18/2017 at 0800  . omeprazole (PRILOSEC) 20 MG capsule Take 20 mg by mouth every morning.   06/18/2017 at 0800   Assessment: CrCl = 60.4 ml/min Ke = 0.055 hr-1 T1/2 = 12.6 hrs Vd = 47.5 L   Goal of Therapy:  Vancomycin trough level 15-20 mcg/ml  Plan:  Expected duration 7 days with resolution of temperature and/or normalization of WBC   Vancomycin 1 gm IV X 1 ordered to be given on 6/29 @ 0300. Vancomycin 1 gm IV Q18H ordered to start on 6/29 @ 0900, ~ 6 hrs after 1st dose (stacked dosing). This pt will reach Css by 7/1 @ 15:00. Will draw 1st trough on 7/1 @ 14:30, which will be at Css.  Patient also on Metronidazole and Ceftriaxone. PCT 44.01   Lacye Mccarn A 08/14/2017,8:22 AM

## 2017-08-15 LAB — BASIC METABOLIC PANEL
Anion gap: 8 (ref 5–15)
BUN: 5 mg/dL — ABNORMAL LOW (ref 6–20)
CO2: 21 mmol/L — ABNORMAL LOW (ref 22–32)
Calcium: 8.2 mg/dL — ABNORMAL LOW (ref 8.9–10.3)
Chloride: 109 mmol/L (ref 98–111)
Creatinine, Ser: 0.6 mg/dL (ref 0.44–1.00)
GFR calc Af Amer: >60 mL/min (ref >60)
GFR calc non Af Amer: >60 mL/min (ref >60)
Glucose, Bld: 104 mg/dL — ABNORMAL HIGH (ref 70–99)
Potassium: 4.6 mmol/L (ref 3.5–5.1)
Sodium: 138 mmol/L (ref 135–145)

## 2017-08-15 LAB — URINE CULTURE: Culture: >=100000 — AB

## 2017-08-15 LAB — MAGNESIUM: Magnesium: 1.7 mg/dL (ref 1.7–2.4)

## 2017-08-15 LAB — PROCALCITONIN: Procalcitonin: 18.56 ng/mL

## 2017-08-15 LAB — PHOSPHORUS: Phosphorus: 2.2 mg/dL — ABNORMAL LOW (ref 2.5–4.6)

## 2017-08-15 MED ORDER — K PHOS MONO-SOD PHOS DI & MONO 155-852-130 MG PO TABS
500.0000 mg | ORAL_TABLET | Freq: Once | ORAL | Status: AC
  Administered 2017-08-15: 500 mg via ORAL
  Filled 2017-08-15: qty 2

## 2017-08-15 MED ORDER — PANTOPRAZOLE SODIUM 40 MG PO TBEC
40.0000 mg | DELAYED_RELEASE_TABLET | Freq: Every day | ORAL | Status: DC
  Administered 2017-08-15: 40 mg via ORAL
  Filled 2017-08-15: qty 1

## 2017-08-15 MED ORDER — MAGNESIUM SULFATE IN D5W 1-5 GM/100ML-% IV SOLN
1.0000 g | Freq: Once | INTRAVENOUS | Status: AC
  Administered 2017-08-15: 1 g via INTRAVENOUS
  Filled 2017-08-15: qty 100

## 2017-08-15 MED ORDER — NICOTINE 21 MG/24HR TD PT24: 21.0000 mg | MEDICATED_PATCH | Freq: Every day | TRANSDERMAL | 0 refills | Status: DC

## 2017-08-15 MED ORDER — DOCUSATE SODIUM 100 MG PO CAPS: 100.0000 mg | ORAL_CAPSULE | Freq: Two times a day (BID) | ORAL | 0 refills | Status: DC

## 2017-08-15 MED ORDER — CEPHALEXIN 500 MG PO CAPS: 500.0000 mg | ORAL_CAPSULE | Freq: Two times a day (BID) | ORAL | 0 refills | Status: DC

## 2017-08-15 MED ORDER — CEPHALEXIN 500 MG PO CAPS
500.0000 mg | ORAL_CAPSULE | Freq: Two times a day (BID) | ORAL | Status: DC
  Administered 2017-08-15: 500 mg via ORAL
  Filled 2017-08-15: qty 1

## 2017-08-15 NOTE — Discharge Instructions (Signed)
Follow-up with primary care physician in 3 to 4 days Follow-up with urology Dr. Gloriann Loan in 2 weeks

## 2017-08-15 NOTE — Discharge Summary (Signed)
[facility] [facility] [place] NAME: Felicia Manning    MR#:  [id number]  DATE OF BIRTH:  1955-06-27  DATE OF ADMISSION:  08/12/2017 ADMITTING PHYSICIAN: Harrie Foreman, MD  DATE OF DISCHARGE: 08/15/17  PRIMARY CARE PHYSICIAN: Glendon Axe, MD    ADMISSION DIAGNOSIS:  Pyelonephritis [N12] Nausea vomiting and diarrhea [R11.2, R19.7] Sepsis, due to unspecified organism ([place]) [A41.9]  DISCHARGE DIAGNOSIS:  Active Problems:   Sepsis ([place])  UTI  ECOLI SECONDARY DIAGNOSIS:   Past Medical History:  Diagnosis Date  . Barrett's esophagus   . Dysrhythmia    stress related at work.  [place] GERD (gastroesophageal reflux disease)   . Hepatitis C 2008  . Hyperlipidemia   . Hypertension   . Hypothyroidism   . Nephrolithiasis   . Osteoporosis   . Thyroid cancer ([place]) 2012  . Thyroid cancer [facility])     HOSPITAL COURSE:  HPI: The patient with past medical history of hypothyroidism following thyroidectomy due to thyroid cancer, GERD, hypertension and kidney stones presents to the emergency department due to confusion, agitation and diarrhea.  All of the patient's symptoms began with multiple episodes of non-bloody non-bilious emesis.  The patient was in her usual state of health yesterday but began to feel fatigued and feverish this morning.  She developed chills by the afternoon and by evening she began vomiting.  She also became confused and combative with her daughter.  She presented to the emergency department via EMS covered in stool.  She was found to have urinary tract infection and met sepsis criteria.  The emergency department staff initiated sepsis protocol prior to the patient becoming hypotensive.  She has responded well to fluid resuscitation.  Now that she has stabilized emergency department staff called the hospitalist service for admission.    1. Sepsis: Patient met septic criteria with leukocytosis, elevated lactic acid and tachypnea  at the time of admission   etiology is urinary tract infection Urine culture with greater than 100,000 colonies of E. coli sensitive to rocephin, cefazolin , cipro etc, d/c pt home with po keflex  Pcp/ urology  may consider starting pt on prophylactic abx after pt completes abx course  Was seen and evaluated by urology for nonobstructive nephrolithiasis recommending conservative management at this time   Outpatient follow-up with urology as recommended after discharge  2. UTI: Present on admission. The patient has had multiple urinary tract infections as well as multiple lithotripsy procedures for kidney stones.  Consider repeat CT scan when more stable. Continued ceftriaxone and  discontinued  vancomycin as MRSA PCR negative. D/c with po keflex Renal ultrasound with no hydronephrosis but has revealed nonobstructing nephrolithiasis  3. Hypothyroidism: TSH normal; continue Synthroid  4. Hypertension: Hold antihypertensive medication cozaar for now as the patient has sepsis associated hypotension.  Continue metoprolol and Norvasc  5. DVT prophylaxis: Heparin 6. GI prophylaxis: Pantoprazole per home regimen  D/c pt home  DISCHARGE CONDITIONS:   STABLE  CONSULTS OBTAINED:  Treatment Team:  Lucas Mallow, MD Hollice Espy, MD   PROCEDURES  NONE   DRUG ALLERGIES:  No Known Allergies  DISCHARGE MEDICATIONS:   Allergies as of 08/15/2017   No Known Allergies     Medication List    STOP taking these medications   losartan 100 MG tablet Commonly known as:  COZAAR     TAKE these medications   amLODipine 10 MG tablet Commonly known as:  NORVASC Take 10 mg by mouth  daily.   cephALEXin 500 MG capsule Commonly known as:  KEFLEX Take 1 capsule (500 mg total) by mouth every 12 (twelve) hours.   cholecalciferol 1000 units tablet Commonly known as:  VITAMIN D Take 2,000 Units by mouth daily.   ciclopirox 8 % solution Commonly known as:  PENLAC Apply 1  application topically at bedtime. Apply over nail and surrounding skin   docusate sodium 100 MG capsule Commonly known as:  COLACE Take 1 capsule (100 mg total) by mouth 2 (two) times daily.   levothyroxine 150 MCG tablet Commonly known as:  SYNTHROID, LEVOTHROID Take 150 mcg by mouth daily at 10 pm.   metoprolol succinate 25 MG 24 hr tablet Commonly known as:  TOPROL-XL Take 25 mg by mouth every morning.   nicotine 21 mg/24hr patch Commonly known as:  NICODERM CQ - dosed in mg/24 hours Place 1 patch (21 mg total) onto the skin daily. Start taking on:  08/16/2017   omeprazole 20 MG capsule Commonly known as:  PRILOSEC Take 20 mg by mouth every morning.        DISCHARGE INSTRUCTIONS:  Follow-up with primary care physician in 3 to 4 days Follow-up with urology Dr. Gloriann Loan in 2 weeks   DIET:  Cardiac diet  DISCHARGE CONDITION:  Fair  ACTIVITY:  Activity as tolerated  OXYGEN:  Home Oxygen: No.   Oxygen Delivery:  room air DISCHARGE LOCATION:  home   If you experience worsening of your admission symptoms, develop shortness of breath, life threatening emergency, suicidal or homicidal thoughts you must seek medical attention immediately by calling 911 or calling your MD immediately  if symptoms less severe.  You Must read complete instructions/literature along with all the possible adverse reactions/side effects for all the Medicines you take and that have been prescribed to you. Take any new Medicines after you have completely understood and accpet all the possible adverse reactions/side effects.   Please note  You were cared for by a hospitalist during your hospital stay. If you have any questions about your discharge medications or the care you received while you were in the hospital after you are discharged, you can call the unit and asked to speak with the hospitalist on call if the hospitalist that took care of you is not available. Once you are discharged, your  primary care physician will handle any further medical issues. Please note that NO REFILLS for any discharge medications will be authorized once you are discharged, as it is imperative that you return to your primary care physician (or establish a relationship with a primary care physician if you do not have one) for your aftercare needs so that they can reassess your need for medications and monitor your lab values.     Today  Chief Complaint  Patient presents with  . Emesis   Patient is doing fine.  Denies any complaints.  Tolerating diet fine.  Wants to go home.  ROS:  CONSTITUTIONAL: Denies fevers, chills. Denies any fatigue, weakness.  EYES: Denies blurry vision, double vision, eye pain. EARS, NOSE, THROAT: Denies tinnitus, ear pain, hearing loss. RESPIRATORY: Denies cough, wheeze, shortness of breath.  CARDIOVASCULAR: Denies chest pain, palpitations, edema.  GASTROINTESTINAL: Denies nausea, vomiting, diarrhea, abdominal pain. Denies bright red blood per rectum. GENITOURINARY: Denies dysuria, hematuria. ENDOCRINE: Denies nocturia or thyroid problems. HEMATOLOGIC AND LYMPHATIC: Denies easy bruising or bleeding. SKIN: Denies rash or lesion. MUSCULOSKELETAL: Denies pain in neck, back, shoulder, knees, hips or arthritic symptoms.  NEUROLOGIC: Denies paralysis, paresthesias.  PSYCHIATRIC: Denies anxiety or depressive symptoms.   VITAL SIGNS:  Blood pressure (!) 161/82, pulse (!) 53, temperature 97.7 F (36.5 C), temperature source Oral, resp. rate 17, height 5' 6" (1.676 m), weight 84.1 kg (185 lb 6.5 oz), SpO2 96 %.  I/O:    Intake/Output Summary (Last 24 hours) at 08/15/2017 1309 Last data filed at 08/15/2017 0715 Gross per 24 hour  Intake 2135 ml  Output 4200 ml  Net -2065 ml    PHYSICAL EXAMINATION:  GENERAL:  62 y.o.-year-old patient lying in the bed with no acute distress.  EYES: Pupils equal, round, reactive to light and accommodation. No scleral icterus. Extraocular  muscles intact.  HEENT: Head atraumatic, normocephalic. Oropharynx and nasopharynx clear.  NECK:  Supple, no jugular venous distention. No thyroid enlargement, no tenderness.  LUNGS: Normal breath sounds bilaterally, no wheezing, rales,rhonchi or crepitation. No use of accessory muscles of respiration.  CARDIOVASCULAR: S1, S2 normal. No murmurs, rubs, or gallops.  ABDOMEN: Soft, non-tender, non-distended. Bowel sounds present. No organomegaly or mass.  EXTREMITIES: No pedal edema, cyanosis, or clubbing.  NEUROLOGIC: Cranial nerves II through XII are intact. Muscle strength 5/5 in all extremities. Sensation intact. Gait not checked.  PSYCHIATRIC: The patient is alert and oriented x 3.  SKIN: No obvious rash, lesion, or ulcer.   DATA REVIEW:   CBC Recent Labs  Lab 08/12/17 2347  WBC 21.5*  HGB 13.4  HCT 40.9  PLT 196    Chemistries  Recent Labs  Lab 08/12/17 2347  08/15/17 0557  NA 137   < > 138  K 3.2*   < > 4.6  CL 106   < > 109  CO2 20*   < > 21*  GLUCOSE 154*   < > 104*  BUN 15   < > 5*  CREATININE 1.06*   < > 0.60  CALCIUM 8.2*   < > 8.2*  MG  --    < > 1.7  AST 34  --   --   ALT 20  --   --   ALKPHOS 80  --   --   BILITOT 0.7  --   --    < > = values in this interval not displayed.    Cardiac Enzymes Recent Labs  Lab 08/13/17 0428  TROPONINI 0.05*    Microbiology Results  Results for orders placed or performed during the hospital encounter of 08/12/17  Urine Culture     Status: Abnormal   Collection Time: 08/13/17  1:25 AM  Result Value Ref Range Status   Specimen Description   Final    URINE, CATHETERIZED Performed at [facility], [address] [place], [place] [address]    Special Requests   Final    NONE Performed at [facility], [address] [place], [place] [address]    Culture >=100,000 COLONIES/mL ESCHERICHIA COLI (A)  Final   Report Status 08/15/2017 FINAL  Final   Organism ID, Bacteria ESCHERICHIA COLI (A)   Final      Susceptibility   Escherichia coli - MIC*    AMPICILLIN <=2 SENSITIVE Sensitive     CEFAZOLIN <=4 SENSITIVE Sensitive     CEFTRIAXONE <=1 SENSITIVE Sensitive     CIPROFLOXACIN <=0.25 SENSITIVE Sensitive     GENTAMICIN <=1 SENSITIVE Sensitive     IMIPENEM <=0.25 SENSITIVE Sensitive     NITROFURANTOIN <=16 SENSITIVE Sensitive     TRIMETH/SULFA <=20 SENSITIVE Sensitive     AMPICILLIN/SULBACTAM <=2 SENSITIVE Sensitive  PIP/TAZO <=4 SENSITIVE Sensitive     Extended ESBL NEGATIVE Sensitive     * >=100,000 COLONIES/mL ESCHERICHIA COLI  Blood culture (routine x 2)     Status: Abnormal (Preliminary result)   Collection Time: 08/13/17  2:25 AM  Result Value Ref Range Status   Specimen Description   Final    BLOOD LEFT WRIST Performed at [facility], [address] [place], [place] [address]    Special Requests   Final    BOTTLES DRAWN AEROBIC AND ANAEROBIC Blood Culture adequate volume Performed at [facility], [address] [place], [place] [address]    Culture  Setup Time   Final    GRAM NEGATIVE RODS AEROBIC BOTTLE ONLY CRITICAL RESULT CALLED TO, READ BACK BY AND VERIFIED WITH: SHEEMA HALLAJI 08/14/17 @ 1517  [place]    Culture (A)  Final    ESCHERICHIA COLI SUSCEPTIBILITIES TO FOLLOW Performed at [facility], [place] [address], [place], [place] [address]    Report Status PENDING  Incomplete  Blood Culture ID Panel (Reflexed)     Status: Abnormal   Collection Time: 08/13/17  2:25 AM  Result Value Ref Range Status   Enterococcus species NOT DETECTED NOT DETECTED Final   Listeria monocytogenes NOT DETECTED NOT DETECTED Final   Staphylococcus species NOT DETECTED NOT DETECTED Final   Staphylococcus aureus NOT DETECTED NOT DETECTED Final   Streptococcus species NOT DETECTED NOT DETECTED Final   Streptococcus agalactiae NOT DETECTED NOT DETECTED Final   Streptococcus pneumoniae NOT DETECTED NOT DETECTED Final   Streptococcus pyogenes NOT  DETECTED NOT DETECTED Final   Acinetobacter baumannii NOT DETECTED NOT DETECTED Final   Enterobacteriaceae species DETECTED (A) NOT DETECTED Corrected    Comment: CRITICAL RESULT CALLED TO, READ BACK BY AND VERIFIED WITH: SHEEMA HALLAJI 08/14/17 @ 4315   [place] Enterobacteriaceae represent a large family of gram-negative bacteria, not a single organism. CORRECTED ON 06/30 AT 1529: PREVIOUSLY REPORTED AS DETECTED Enterobacteriaceae represent a large family of gram negative bacteria, not a single organism. RBV SHEEMA HALLAJI 08/14/17 @ 4008  [place]    Enterobacter cloacae complex NOT DETECTED NOT DETECTED Final   Escherichia coli DETECTED (A) NOT DETECTED Corrected    Comment: CRITICAL RESULT CALLED TO, READ BACK BY AND VERIFIED WITH: SHEEMA HALLAJI 08/14/17 @ 6761  [place] ON 06/30 AT 1529: PREVIOUSLY REPORTED AS DETECTED RBV SHEEMA HALLAJI 08/14/17 @ 9509  [place]    Klebsiella oxytoca NOT DETECTED NOT DETECTED Final   Klebsiella pneumoniae NOT DETECTED NOT DETECTED Final   Proteus species NOT DETECTED NOT DETECTED Final   Serratia marcescens NOT DETECTED NOT DETECTED Final   Carbapenem resistance NOT DETECTED NOT DETECTED Final   Haemophilus influenzae NOT DETECTED NOT DETECTED Final   Neisseria meningitidis NOT DETECTED NOT DETECTED Final   Pseudomonas aeruginosa NOT DETECTED NOT DETECTED Final   Candida albicans NOT DETECTED NOT DETECTED Final   Candida glabrata NOT DETECTED NOT DETECTED Final   Candida krusei NOT DETECTED NOT DETECTED Final   Candida parapsilosis NOT DETECTED NOT DETECTED Final   Candida tropicalis NOT DETECTED NOT DETECTED Final    Comment: Performed at [facility], [address] [place], [place] [address]  Blood culture (routine x 2)     Status: None (Preliminary result)   Collection Time: 08/13/17  2:33 AM  Result Value Ref Range Status   Specimen Description BLOOD RIGHT WRIST  Final   Special Requests   Final    BOTTLES DRAWN  AEROBIC AND ANAEROBIC Blood  Culture results may not be optimal due to an excessive volume of blood received in culture bottles   Culture   Final    NO GROWTH 2 DAYS Performed at [facility], [address] [place], [place] [address]    Report Status PENDING  Incomplete  MRSA PCR Screening     Status: None   Collection Time: 08/13/17  4:48 AM  Result Value Ref Range Status   MRSA by PCR NEGATIVE NEGATIVE Final    Comment:        The GeneXpert MRSA Assay (FDA approved for NASAL specimens only), is one component of a comprehensive MRSA colonization surveillance program. It is not intended to diagnose MRSA infection nor to guide or monitor treatment for MRSA infections. Performed at [facility], [address] [place], [place] [address]     RADIOLOGY:  Ct Head Wo Contrast  Result Date: 08/13/2017 CLINICAL DATA:  Altered mental status, improved with resuscitation. Questioned facial asymmetry on exam today. EXAM: CT HEAD WITHOUT CONTRAST TECHNIQUE: Contiguous axial images were obtained from the base of the skull through the vertex without intravenous contrast. COMPARISON:  None. FINDINGS: Brain: No evidence of acute infarction, hemorrhage, hydrocephalus, extra-axial collection or mass lesion/mass effect. Vascular: Atherosclerotic calcification. Skull: Normal. Negative for fracture or focal lesion. Sinuses/Orbits: Nonobstructive left inferior frontal sinus osteoma IMPRESSION: No acute finding.  No evidence of infarct. Electronically Signed   By: Monte Fantasia M.D.   On: 08/13/2017 10:24   US Renal  Result Date: 08/13/2017 CLINICAL DATA:  Sepsis.  Recurrent UTIs. EXAM: RENAL / URINARY TRACT ULTRASOUND COMPLETE COMPARISON:  Body CT 06/18/2017 FINDINGS: Right Kidney: Length: 14.2. Echogenicity within normal limits. No mass or hydronephrosis visualized. Nonobstructive nephrolithiasis. Two benign-appearing cysts in the midpolar region of the right kidney measure 4.9 x 3.0 x 3.5 cm and 5.4 x 3.8 x  5.4 cm. Left Kidney: Length: 13.3 cm. Echogenicity within normal limits. No mass or hydronephrosis visualized. Nonobstructive nephrolithiasis. Two benign-appearing cysts in the midpolar region of the left kidney measure 2.0 x 1.4 x 1.8 cm and 1.5 x 1.5 x 1.2 cm. Bladder: Appears normal for degree of bladder distention. IMPRESSION: No evidence of hydronephrosis. Bilateral nonobstructive nephrolithiasis. Bilateral benign-appearing renal cysts. Electronically Signed   By: Fidela Salisbury M.D.   On: 08/13/2017 11:01   Dg Chest Port 1 View  Result Date: 08/14/2017 CLINICAL DATA:  Acute respiratory failure EXAM: PORTABLE CHEST 1 VIEW COMPARISON:  Chest radiograph 08/13/2017 FINDINGS: The lungs are well inflated. The cardiomediastinal silhouette size is normal. There is calcific aortic atherosclerosis. There is atelectasis at the left lung base. No focal consolidation or pulmonary edema. There is no pleural effusion or pneumothorax. IMPRESSION: No active disease. Electronically Signed   By: Ulyses Jarred M.D.   On: 08/14/2017 03:59   Dg Chest Port 1 View  Result Date: 08/13/2017 CLINICAL DATA:  Acute respiratory failure EXAM: PORTABLE CHEST 1 VIEW COMPARISON:  04/08/2015 FINDINGS: Streaky opacity at the left base where there is mild scarring by abdominal CT last month. No edema, effusion, or pneumothorax. Normal heart size for technique. Thyroidectomy clips. IMPRESSION: Mild opacity at the left base is at least partially from scarring based on priors. Bronchopneumonia is not excluded in the setting of sepsis, consider lateral view. Electronically Signed   By: Monte Fantasia M.D.   On: 08/13/2017 09:30    EKG:   Orders placed or performed during the hospital encounter of 08/12/17  . EKG  12-Lead  . EKG 12-Lead  . EKG      Management plans discussed with the patient, family and they are in agreement.  CODE STATUS:     Code Status Orders  (From admission, onward)        Start     Ordered    08/13/17 0357  Full code  Continuous     08/13/17 0356    Code Status History    Date Active Date Inactive Code Status Order ID Comments User Context   06/18/2017 1950 06/20/2017 1814 Full Code [id number]  Henreitta Leber, MD Inpatient   04/08/2015 1116 04/10/2015 1435 Full Code [id number]  Hillary Bow, MD ED      TOTAL TIME TAKING CARE OF THIS PATIENT: 42 minutes.   Note: This dictation was prepared with Dragon dictation along with smaller phrase technology. Any transcriptional errors that result from this process are unintentional.   _0 @  on 08/15/2017 at 1:09 PM  Between 7am to 6pm - Pager - [phone]  After 6pm go to [email or web address] - password [email or web address]  [facility]  Office  [phone]  CC: Primary care physician; Glendon Axe, MD

## 2017-08-15 NOTE — Consult Note (Signed)
[place] for electrolytes Indication: electrolyte disturbance  No Known Allergies  Patient Measurements: Height: 5\' 6"  (167.6 cm) Weight: 185 lb 6.5 oz (84.1 kg) IBW/kg (Calculated) : 59.3 Adjusted Body Weight:   Vital Signs: Temp: 97.7 F (36.5 C) (07/01 1158) Temp Source: Oral (07/01 1158) BP: 161/82 (07/01 1158) Pulse Rate: 53 (07/01 1158) Intake/Output from previous day: 06/30 0701 - 07/01 0700 In: 2135 [P.O.:358; I.V.:1677; IV Piggyback:100] Out: 4400 [Urine:4400] Intake/Output from this shift: Total I/O In: 0  Out: 1000 [Urine:1000]  Labs: Recent Labs    08/12/17 2347  08/13/17 0428 08/13/17 1422 08/14/17 0715 08/15/17 0557  WBC 21.5*  --   --   --   --   --   HGB 13.4  --   --   --   --   --   HCT 40.9  --   --   --   --   --   PLT 196  --   --   --   --   --   CREATININE 1.06*  --  0.95  --  0.58 0.60  MG  --    < > 1.1* 2.3 2.1 1.7  PHOS  --   --  <1.0*  --  2.3* 2.2*  ALBUMIN 3.4*  --   --   --   --   --   PROT 6.7  --   --   --   --   --   AST 34  --   --   --   --   --   ALT 20  --   --   --   --   --   ALKPHOS 80  --   --   --   --   --   BILITOT 0.7  --   --   --   --   --    < > = values in this interval not displayed.   Estimated Creatinine Clearance: 81.7 mL/min (by C-G formula based on SCr of 0.6 mg/dL).   Assessment: Pt admitted with confusion, agitation, and diarrhea. Found to have phos <1, Mg 1.1 and K 2.8 this AM. Pharmacy consulted for electrolyte management.  Goal of Therapy:  Normalization of electrolytes  Plan:  ICU NP ordered Kphos 43mmol IV x 2 doses and Mg 2g IV. I will give another 2g IV of Mg. Will recheck K and Mg at 1400 (second K phos will not be finished infusing at that time) and then all electrolytes again in the AM.  6/29 1422: K=4.1, Mag=2.3. No supplemental doses at this time. F/u labs in am. Note: Patient on IVF NS w/ KCL 40 meq/L @ 125 ml/hr  6/30 K 4.1, Mag 2.1, Phos  2.3, Scr 0.58  Patient on IVF NS w/ KCL 40 meq/L @ 125 ml/hr. Will order KPhos neutral PO q4h x 2 doses.  7/1 K 4.6, Mag 1.7, Phos 2.2 MD already ordered Mag sulfate 1 g IV x1 Will order KPhos neutral 500 mg PO x1 as pt has d/c orders Recheck in AM if still here   Rayna Sexton, PharmD, BCPS Clinical Pharmacist 08/15/2017 1:56 PM

## 2017-08-15 NOTE — Progress Notes (Signed)
Discharge instructions reviewed with patient including followup appointments and new medications/medication changes.  Understanding was verbalized and all questions were answered.  Patient discharged home ambulatory in stable condition escorted by volunteer staff.

## 2017-08-16 LAB — CULTURE, BLOOD (ROUTINE X 2): Special Requests: ADEQUATE

## 2017-08-18 ENCOUNTER — Telehealth: Payer: Self-pay

## 2017-08-18 LAB — CULTURE, BLOOD (ROUTINE X 2)

## 2017-08-18 NOTE — Telephone Encounter (Signed)
EMMI Follow-up: Noted on the report that the patient had some unfilled Rx's. I left a message for Felicia Manning to call me at her convenience.  Will follow-up again.

## 2017-08-19 ENCOUNTER — Telehealth: Payer: Self-pay

## 2017-08-19 NOTE — Telephone Encounter (Signed)
EMMI Follow-up: 2nd follow-up call made and Ms. Blumenberg said she did have her Rx filled and taking antibotics now.  She said the system didn't recognize her response correctly and I apologized.  No other needs noted today. I let her know there would be a second automated call with a different series of questions and to let us know at that time if she had any concerns.

## 2017-09-02 ENCOUNTER — Ambulatory Visit (INDEPENDENT_AMBULATORY_CARE_PROVIDER_SITE_OTHER): Payer: 59 | Admitting: Urology

## 2017-09-02 ENCOUNTER — Encounter: Payer: Self-pay | Admitting: Urology

## 2017-09-02 VITALS — BP 126/79 | HR 85 | Ht 67.0 in | Wt 185.0 lb

## 2017-09-02 DIAGNOSIS — Z87448 Personal history of other diseases of urinary system: Secondary | ICD-10-CM | POA: Diagnosis not present

## 2017-09-02 DIAGNOSIS — N390 Urinary tract infection, site not specified: Secondary | ICD-10-CM

## 2017-09-02 DIAGNOSIS — N200 Calculus of kidney: Secondary | ICD-10-CM | POA: Diagnosis not present

## 2017-09-02 LAB — URINALYSIS, COMPLETE
Bilirubin, UA: NEGATIVE
Glucose, UA: NEGATIVE
Ketones, UA: NEGATIVE
Nitrite, UA: POSITIVE — AB
Protein, UA: NEGATIVE
Specific Gravity, UA: 1.01 (ref 1.005–1.030)
Urobilinogen, Ur: 0.2 mg/dL (ref 0.2–1.0)
pH, UA: 6.5 (ref 5.0–7.5)

## 2017-09-02 LAB — MICROSCOPIC EXAMINATION

## 2017-09-02 NOTE — Progress Notes (Signed)
09/02/2017 10:29 AM   Felicia Manning Jul 25, 1955 [id number]  Referring provider: Glendon Axe, MD [facility] [place], [place] [address]  Chief Complaint  Patient presents with  . Follow-up    hospital follow up    HPI: 62 year old female with recurrent nephrolithiasis returns today for follow-up after recent hospital admission for pyelonephritis.  E. Coli grew in her blood and urine which was pan sensitive.    She did have a renal ultrasound at the time of the admission which showed no obstructing stones or hydronephrosis.  Most recent CT scan performed for the purpose of diverticulitis on 06/18/2017 shows essentially bilateral stable stone disease, unchanged upper tract burden since 06/2016.  She was also treated for a "kidney infection" in [place] about a month ago prior to this infection.     She notes that on each occasion prior to becoming sick, she was in the car for very prolonged periods of time.  She did not use the restroom on a regular basis was often holding her urine.  She had no urinary sypmptoms associated with her infections incldugind no dysufia, hematuria, flank pain or traditional symptoms.  She did experience confusion, fevers, vomiting, and overall systemic effects.  Today, she remains asymptomatic.  Stone analysis consistent with 65% calcium oxalate monohydrate, 3% calcium oxalate dihydrate, and 32% calcium phosphate.   She does have a personal history of stones which have required surgical intervetnion on multiple occasions including ureteroscopy x multiple and ESWL (unsuccessful).    Her most recent procedure was in 06/2016 for a right distal ureteral stone.  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  She did previously undergo 24 hour urine metabolic workup which revealed a fairly decent urinary volume of 2.2 L, no  evidence of hypercalcemia. No other obvious pathologic findings. She did have a good urinary citrate. See Epic for details.   PMH: Past Medical History:  Diagnosis Date  . Barrett's esophagus   . Dysrhythmia    stress related at work.  Marland Kitchen GERD (gastroesophageal reflux disease)   . Hepatitis C 2008  . Hyperlipidemia   . Hypertension   . Hypothyroidism   . Nephrolithiasis   . Osteoporosis   . Thyroid cancer ([place]) 2012  . Thyroid cancer [facility])     Surgical History: Past Surgical History:  Procedure Laterality Date  . ABDOMINAL HYSTERECTOMY  2006  . COLONOSCOPY WITH PROPOFOL N/A 04/25/2015   Procedure: COLONOSCOPY WITH PROPOFOL;  Surgeon: Josefine Class, MD;  Location: [facility] ENDOSCOPY;  Service: Endoscopy;  Laterality: N/A;  . CYSTOSCOPY W/ RETROGRADES Bilateral 05/19/2015   Procedure: CYSTOSCOPY WITH RETROGRADE PYELOGRAM;  Surgeon: Hollice Espy, MD;  Location: [facility] ORS;  Service: Urology;  Laterality: Bilateral;  . CYSTOSCOPY W/ URETERAL STENT PLACEMENT Right 05/26/2015   Procedure: CYSTOSCOPY WITH STENT REPLACEMENT;  Surgeon: Hollice Espy, MD;  Location: [facility] ORS;  Service: Urology;  Laterality: Right;  . CYSTOSCOPY WITH STENT PLACEMENT Right 05/19/2015   Procedure: CYSTOSCOPY WITH STENT PLACEMENT;  Surgeon: Hollice Espy, MD;  Location: [facility] ORS;  Service: Urology;  Laterality: Right;  . CYSTOSCOPY WITH STENT PLACEMENT Right 05/26/2015   Procedure: CYSTOSCOPY WITH STENT PLACEMENT;  Surgeon: Hollice Espy, MD;  Location: [facility] ORS;  Service: Urology;  Laterality: Right;  . CYSTOSCOPY WITH STENT PLACEMENT Right 06/25/2016   Procedure: CYSTOSCOPY WITH STENT PLACEMENT;  Surgeon: Nickie Retort, MD;  Location: [facility] ORS;  Service: Urology;  Laterality: Right;  . CYSTOSCOPY/URETEROSCOPY/HOLMIUM LASER/STENT PLACEMENT Left 07/23/2015   Procedure: CYSTOSCOPY/URETEROSCOPY/HOLMIUM LASER/STENT PLACEMENT/RETROGRADE PYELOGRAM;  Surgeon: Hollice Espy, MD;  Location: [facility] ORS;  Service:  Urology;  Laterality: Left;  . ESOPHAGOGASTRODUODENOSCOPY (EGD) WITH PROPOFOL N/A 04/25/2015   Procedure: ESOPHAGOGASTRODUODENOSCOPY (EGD) WITH PROPOFOL;  Surgeon: Josefine Class, MD;  Location: [facility] ENDOSCOPY;  Service: Endoscopy;  Laterality: N/A;  . EYE SURGERY Bilateral 1964   lazy eye repair  . EYE SURGERY Bilateral 2001   lazy eye repair  . LITHOTRIPSY  2009  . THYROIDECTOMY  2010  . URETEROSCOPY WITH HOLMIUM LASER LITHOTRIPSY Right 05/19/2015   Procedure: URETEROSCOPY WITH HOLMIUM LASER LITHOTRIPSY;  Surgeon: Hollice Espy, MD;  Location: [facility] ORS;  Service: Urology;  Laterality: Right;  . URETEROSCOPY WITH HOLMIUM LASER LITHOTRIPSY Right 05/26/2015   Procedure: URETEROSCOPY WITH HOLMIUM LASER LITHOTRIPSY;  Surgeon: Hollice Espy, MD;  Location: [facility] ORS;  Service: Urology;  Laterality: Right;  . URETEROSCOPY WITH HOLMIUM LASER LITHOTRIPSY Right 06/25/2016   Procedure: URETEROSCOPY WITH HOLMIUM LASER LITHOTRIPSY;  Surgeon: Nickie Retort, MD;  Location: [facility] ORS;  Service: Urology;  Laterality: Right;    Home Medications:  Allergies as of 09/02/2017   No Known Allergies     Medication List        Accurate as of 09/02/17 10:29 AM. Always use your most recent med list.          amLODipine 5 MG tablet Commonly known as:  NORVASC Take 5 mg by mouth daily.   cholecalciferol 1000 units tablet Commonly known as:  VITAMIN D Take 2,000 Units by mouth daily.   levothyroxine 150 MCG tablet Commonly known as:  SYNTHROID, LEVOTHROID Take 150 mcg by mouth at bedtime.   metoprolol succinate 25 MG 24 hr tablet Commonly known as:  TOPROL-XL Take 25 mg by mouth every morning.   nicotine 21 mg/24hr patch Commonly known as:  NICODERM CQ - dosed in mg/24 hours Place 1 patch (21 mg total) onto the skin daily.   omeprazole 20 MG capsule Commonly known as:  PRILOSEC Take 20 mg by mouth every morning.       Allergies: No Known Allergies  Family History: Family History    Problem Relation Age of Onset  . CAD Mother   . Heart disease Mother   . Dementia Father   . Bladder Cancer Neg Hx   . Prostate cancer Neg Hx   . Kidney cancer Neg Hx     Social History:  reports that she has been smoking cigarettes.  She has a 40.00 pack-year smoking history. She has never used smokeless tobacco. She reports that she drinks alcohol. She reports that she does not use drugs.  ROS: UROLOGY Frequent Urination?: No Hard to postpone urination?: No Burning/pain with urination?: No Get up at night to urinate?: No Leakage of urine?: No Urine stream starts and stops?: No Trouble starting stream?: No Do you have to strain to urinate?: No Blood in urine?: No Urinary tract infection?: No Sexually transmitted disease?: No Injury to kidneys or bladder?: No Painful intercourse?: No Weak stream?: No Currently pregnant?: No Vaginal bleeding?: No Last menstrual period?: n  Gastrointestinal Nausea?: No Vomiting?: No Indigestion/heartburn?: Yes Diarrhea?: No Constipation?: No  Constitutional Fever: No Night sweats?: No Weight loss?: No Fatigue?: No  Skin Skin rash/lesions?: No Itching?: No  Eyes Blurred vision?: No Double vision?: No  Ears/Nose/Throat Sore throat?: No Sinus problems?: No  Hematologic/Lymphatic Swollen glands?: No Easy bruising?: No  Cardiovascular Leg swelling?: No Chest  pain?: No  Respiratory Cough?: Yes Shortness of breath?: No  Endocrine Excessive thirst?: No  Musculoskeletal Back pain?: No Joint pain?: No  Neurological Headaches?: No Dizziness?: No  Psychologic Depression?: No Anxiety?: Yes  Physical Exam: BP 126/79   Pulse 85   Ht 5\' 7"  (1.702 m)   Wt 185 lb (83.9 kg)   BMI 28.98 kg/m   Constitutional:  Alert and oriented, No acute distress. HEENT: [place] AT, moist mucus membranes.  Trachea midline, no masses. Cardiovascular: No clubbing, cyanosis, or edema. Respiratory: Normal respiratory effort, no  increased work of breathing. GI: Abdomen is soft, nontender, nondistended, no abdominal masses GU: No CVA tenderness Skin: No rashes, bruises or suspicious lesions. Neurologic: Grossly intact, no focal deficits, moving all 4 extremities. Psychiatric: Normal mood and affect.  Laboratory Data: Lab Results  Component Value Date   WBC 21.5 (H) 08/12/2017   HGB 13.4 08/12/2017   HCT 40.9 08/12/2017   MCV 91.4 08/12/2017   PLT 196 08/12/2017    Lab Results  Component Value Date   CREATININE 0.60 08/15/2017    Urinalysis UA pending  Pertinent Imaging: Results for orders placed during the hospital encounter of 08/12/17  US RENAL   Narrative CLINICAL DATA:  Sepsis.  Recurrent UTIs.  EXAM: RENAL / URINARY TRACT ULTRASOUND COMPLETE  COMPARISON:  Body CT 06/18/2017  FINDINGS: Right Kidney:  Length: 14.2. Echogenicity within normal limits. No mass or hydronephrosis visualized. Nonobstructive nephrolithiasis. Two benign-appearing cysts in the midpolar region of the right kidney measure 4.9 x 3.0 x 3.5 cm and 5.4 x 3.8 x 5.4 cm.  Left Kidney:  Length: 13.3 cm. Echogenicity within normal limits. No mass or hydronephrosis visualized. Nonobstructive nephrolithiasis. Two benign-appearing cysts in the midpolar region of the left kidney measure 2.0 x 1.4 x 1.8 cm and 1.5 x 1.5 x 1.2 cm.  Bladder:  Appears normal for degree of bladder distention.  IMPRESSION: No evidence of hydronephrosis.  Bilateral nonobstructive nephrolithiasis.  Bilateral benign-appearing renal cysts.   Electronically Signed   By: Fidela Salisbury M.D.   On: 08/13/2017 11:01    CLINICAL DATA:  Abdominopelvic pain for 3 days. No bowel movement since Thursday.  EXAM: CT ABDOMEN AND PELVIS WITH CONTRAST  TECHNIQUE: Multidetector CT imaging of the abdomen and pelvis was performed using the standard protocol following bolus administration of intravenous contrast.  CONTRAST:  75mL  ISOVUE-370 IOPAMIDOL (ISOVUE-370) INJECTION 76%  COMPARISON:  CT 06/17/2016  FINDINGS: Lower chest: Lung bases are clear.  Hepatobiliary: Hypodense lesion in the RIGHT hepatic lobe has simple fluid attenuation consistent with simple cyst no biliary duct dilatation. Normal gallbladder  Pancreas: Pancreas is normal. No ductal dilatation. No pancreatic inflammation. Rounded lesion associated with the tail the pancreas measures 19 mm (image 25/2) which compared to 17 mm on CT 04/08/2015 for no significant change in size. This is favored a splenule.  Spleen: Normal spleen  Adrenals/urinary tract: Adrenal glands are normal. Bilateral simple fluid attenuation renal cysts lesions. Bilateral nonobstructing renal calculi. Ureters normal. Bladder normal  Stomach/Bowel: Stomach, duodenum, pending send cecum normal. Ascending and transverse colon normal.  There are multiple diverticula along the proximal sigmoid colon. There is a 8 cm segment of bowel wall thickening of proximal sigmoid colon through this region of diverticulosis. There is inflammation and stranding in the sigmoid mesocolon adjacent to the bowel wall thickening (image 76/2 and image 55/5). No perforation or abscess. Rectum normal  Vascular/Lymphatic: Abdominal aorta is normal caliber with atherosclerotic calcification. There is no retroperitoneal  or periportal lymphadenopathy. No pelvic lymphadenopathy.  Reproductive: Post hysterectomy.  Other: No free fluid.  Musculoskeletal: No aggressive osseous lesion.  IMPRESSION: 1. Eight cm segment of bowel wall thickening and inflammation involving the proximal sigmoid colon through a region multiple diverticula. Findings are most consistent with acute diverticulitis. Recommend follow-up imaging or colonoscopy (if patient is not current for screening) to exclude underlying mucosal lesion. 2. Small round lesion associated tail of the pancreas is  relatively stable compared to prior CT scans. Favor small splenule. Further characterization could be achieved with contrast MRI.   Electronically Signed   By: Suzy Bouchard M.D.   On: 06/18/2017 15:51  CT scan and ultrasound imaging were personally reviewed by me today.  Assessment & Plan:    1. Nephrolithiasis Overall stone burden appears to be similar and unchanged from last year her most recent otherwise asymptomatic Many of the stones is been accessible endoscopically I do not suspect that this is the underlying etiology of her recent pyelonephritis/UTI episode No surgical intervention planned at this time KUB in 1 year to ensure stone stability Return if stones become symptomatic - Urinalysis, Complete - CULTURE, URINE COMPREHENSIVE  2. Recurrent UTI 2 recent episodes of severe UTI/pyelonephritis both associated with prolonged car rides in holding No previous history of significant or severe UTI infections Plan to recheck her urine today to ensure that the infection is cleared- Urine pending at the time of this note If she continues to have recurrent episodes of infection, will consider addition of topical estrogen cream as well as cystoscopy to rule out fistula or other bladder pathology especially in light of her recent episode of diverticulitis In addition to the above, recommend daily probiotic as well as cranberry tabs twice daily for UTI prevention She will return if she has any signs or symptoms of UTI  3. History of pyelonephritis As above   Return in about 1 year (around 09/03/2018) for KUB.  Hollice Espy, MD  [facility] [address], [place], [place] [address] [phone]

## 2017-09-08 ENCOUNTER — Telehealth: Payer: Self-pay | Admitting: Family Medicine

## 2017-09-08 LAB — CULTURE, URINE COMPREHENSIVE

## 2017-09-08 NOTE — Telephone Encounter (Signed)
-----   Message from Hollice Espy, MD sent at 09/08/2017  1:01 PM EDT ----- Please let Ms. Graber know that we sent her urine off for culture as a precaution.  She ended up growing the same E. coli as she has on previous occasions which is pansensitive.  She was not symptomatic when she was seen in the office.  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  As such, I do not think that she needs to be treated at this time.  I recommend that she call us with any signs or symptoms of infection and given her fairly profound presentations in the past, will treat immediately as needed.  Hollice Espy, MD

## 2017-09-08 NOTE — Telephone Encounter (Signed)
Patient notified and voiced understanding.

## 2017-09-20 IMAGING — US US RENAL
1 series · 14 of 25 positions shown · non-contrast
Comparison: CT 06/17/2016, 04/08/2015.  Ultrasound 10/23/2015

CLINICAL DATA: Nephrolithiasis.

EXAM:
RENAL / URINARY TRACT ULTRASOUND COMPLETE

[Series 1: us renal · 0.25mm/px · 14 of 61 slices shown]
[im 1/61]
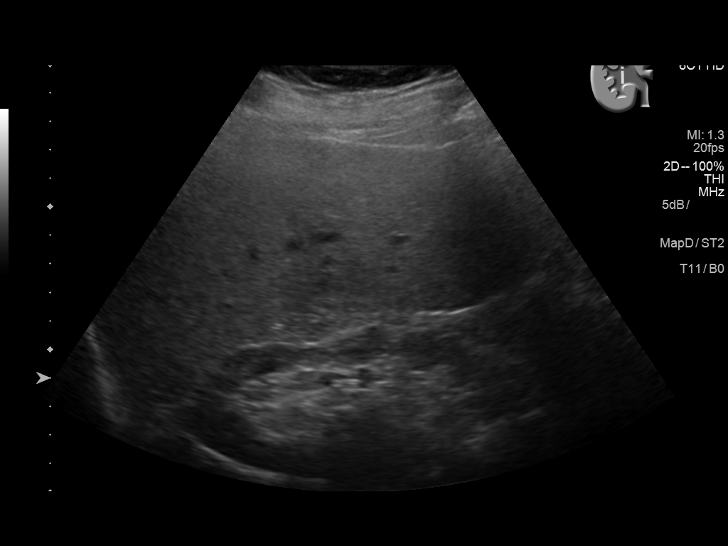
[im 6/61]
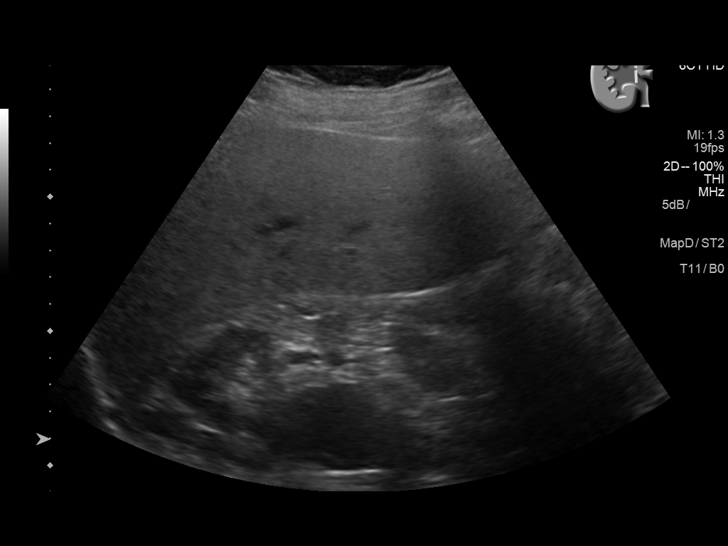
[im 11/61]
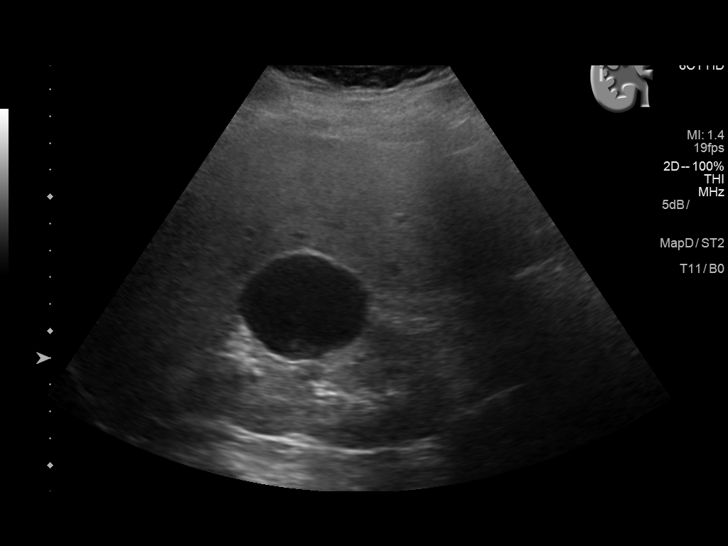
[im 16/61]
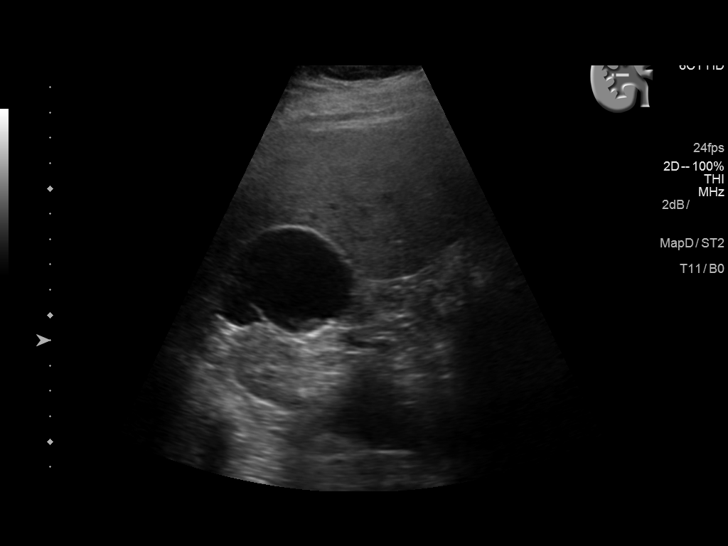
[im 21/61]
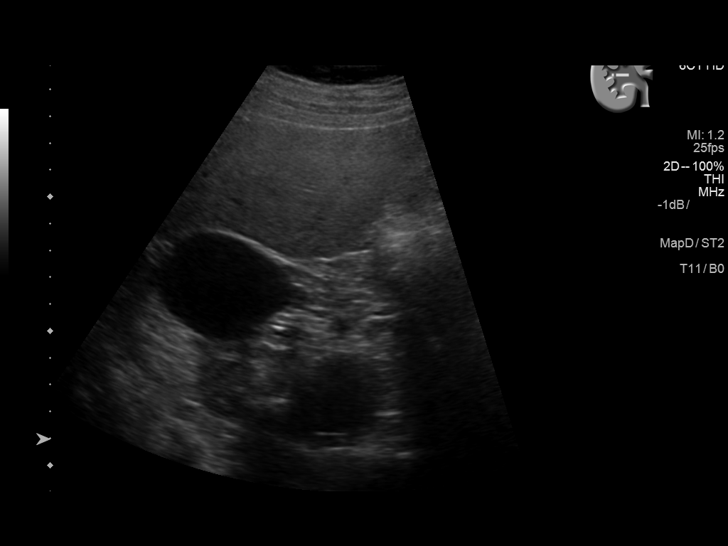
[im 23/61]
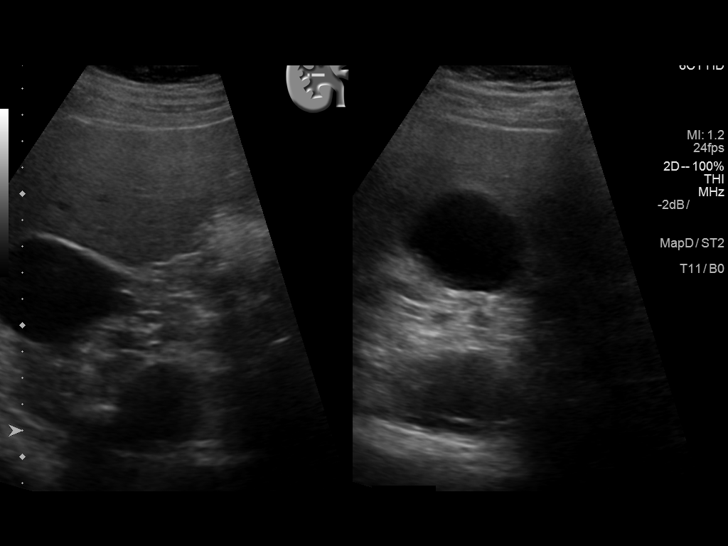
[im 28/61]
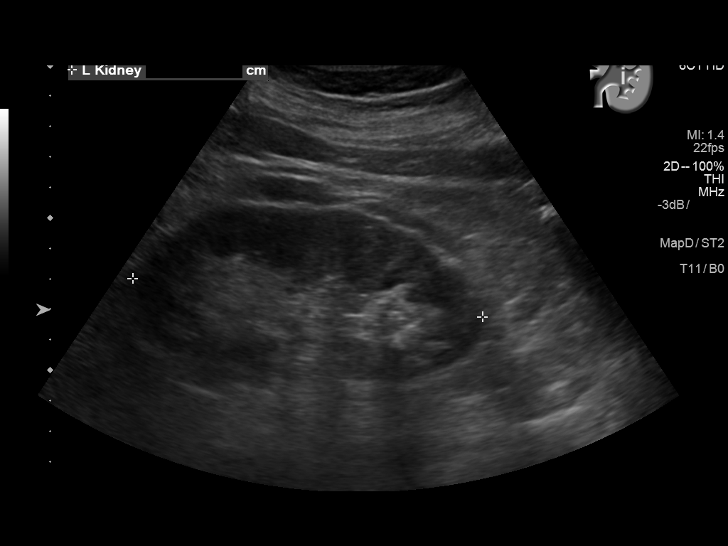
[im 33/61]
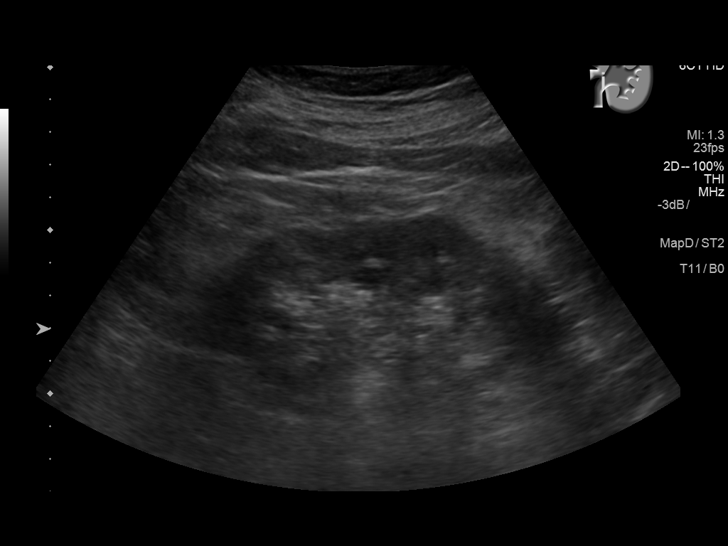
[im 38/61]
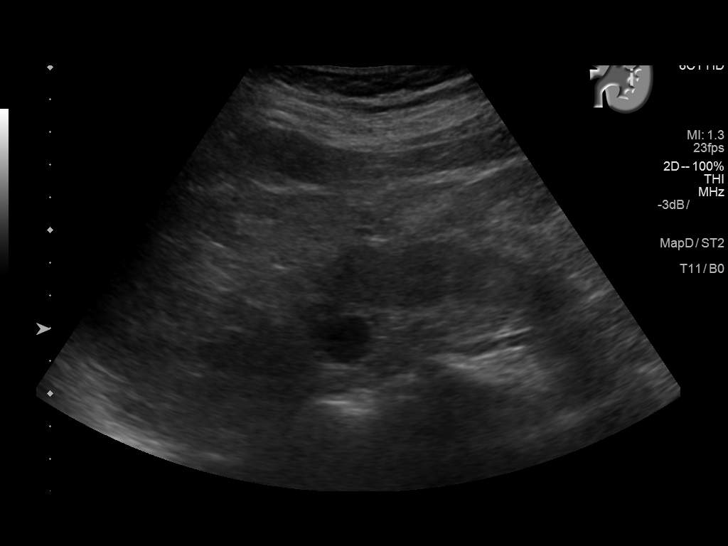
[im 41/61]
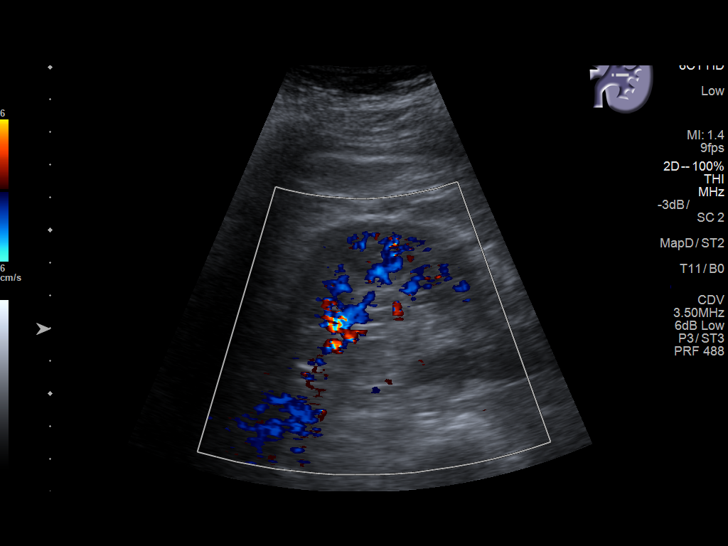
[im 46/61]
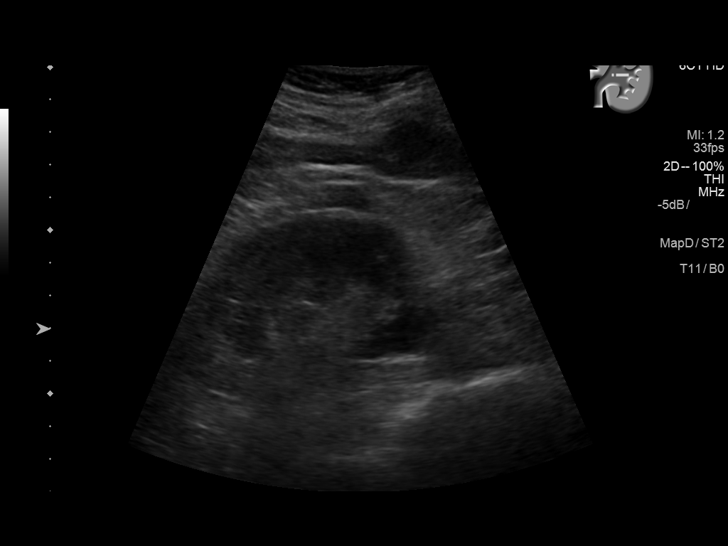
[im 51/61]
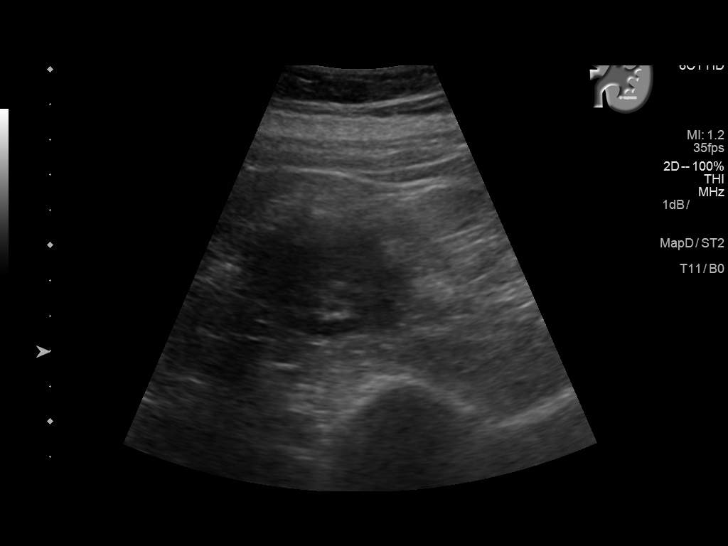
[im 56/61]
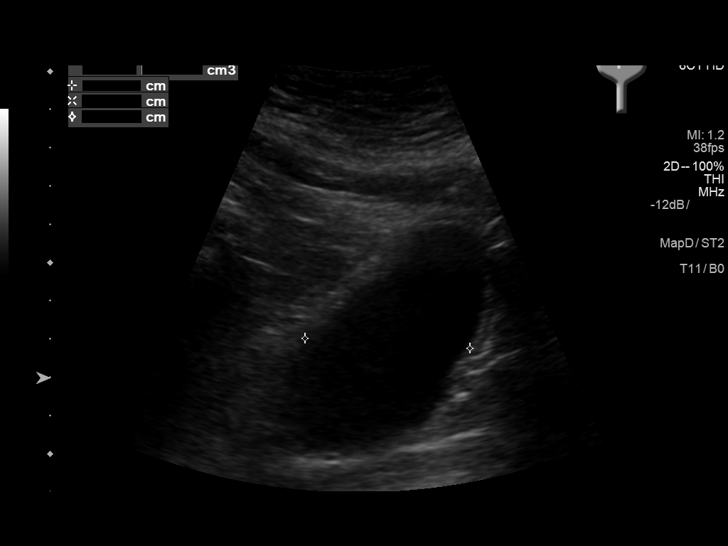
[im 61/61]
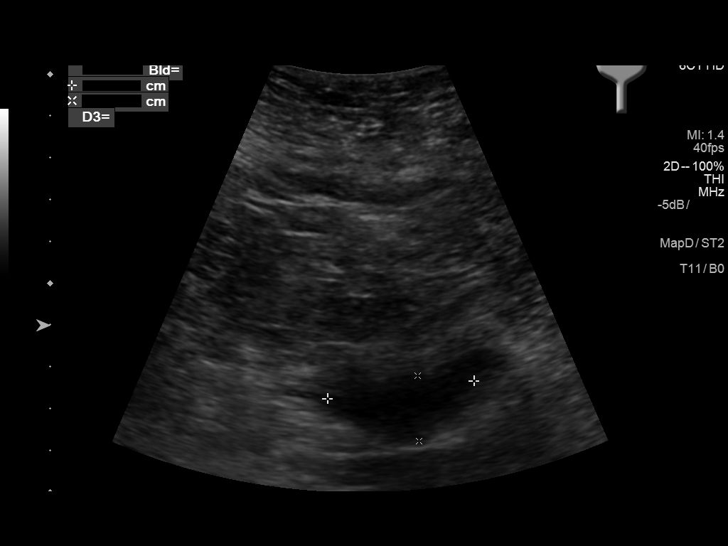

[14 of 25 positions shown; findings below may reference images not displayed]

FINDINGS: Right Kidney:

Length: 11.8 cm. Echogenicity within normal limits. 5.2 x 3.9 x
cm thinly septated cyst midportion right kidney, stable from prior
exam. [DATE] x 3.2 x 3.2 cm cyst with thin septation noted in the
midportion of the right kidney. Similar findings noted on prior
exams. Multiple nonobstructing stones noted. Largest measures
mm. No hydronephrosis visualized on today's exam. Previously
identified right hydronephrosis has resolved.

Left Kidney:

Length: 11.6 cm. Echogenicity within normal limits. 0.8 x 1.0 x
cm and 1.3 x 1.6 x 1.9 cm simple cyst again noted. Multiple stones
are again noted. Largest measures 6.5 mm

Bladder:

Appears normal for degree of bladder distention. Prevoid volume 67
cc, postvoid volume 2 cc.
IMPRESSION: 1. Obstructing nephrolithiasis again noted. Previously identified
right hydronephrosis has resolved.

2. Bilateral benign-appearing cysts are again noted. Similar
findings noted on multiple prior exams .

## 2017-10-30 ENCOUNTER — Encounter: Payer: Self-pay | Admitting: Emergency Medicine

## 2017-10-30 ENCOUNTER — Inpatient Hospital Stay
Admission: EM | Admit: 2017-10-30 | Discharge: 2017-11-01 | DRG: 854 | Disposition: A | Discharge status: Home / Self Care | Payer: 59 | Attending: Internal Medicine | Admitting: Internal Medicine

## 2017-10-30 ENCOUNTER — Emergency Department: Payer: 59

## 2017-10-30 DIAGNOSIS — Z79899 Other long term (current) drug therapy: Secondary | ICD-10-CM

## 2017-10-30 DIAGNOSIS — E871 Hypo-osmolality and hyponatremia: Secondary | ICD-10-CM | POA: Diagnosis present

## 2017-10-30 DIAGNOSIS — Z85850 Personal history of malignant neoplasm of thyroid: Secondary | ICD-10-CM

## 2017-10-30 DIAGNOSIS — B1920 Unspecified viral hepatitis C without hepatic coma: Secondary | ICD-10-CM | POA: Diagnosis present

## 2017-10-30 DIAGNOSIS — E039 Hypothyroidism, unspecified: Secondary | ICD-10-CM | POA: Diagnosis present

## 2017-10-30 DIAGNOSIS — A419 Sepsis, unspecified organism: Principal | ICD-10-CM | POA: Diagnosis present

## 2017-10-30 DIAGNOSIS — N200 Calculus of kidney: Secondary | ICD-10-CM

## 2017-10-30 DIAGNOSIS — R739 Hyperglycemia, unspecified: Secondary | ICD-10-CM | POA: Diagnosis present

## 2017-10-30 DIAGNOSIS — N390 Urinary tract infection, site not specified: Secondary | ICD-10-CM | POA: Diagnosis present

## 2017-10-30 DIAGNOSIS — R509 Fever, unspecified: Secondary | ICD-10-CM

## 2017-10-30 DIAGNOSIS — I10 Essential (primary) hypertension: Secondary | ICD-10-CM | POA: Diagnosis present

## 2017-10-30 DIAGNOSIS — N201 Calculus of ureter: Secondary | ICD-10-CM | POA: Diagnosis present

## 2017-10-30 DIAGNOSIS — Z79890 Hormone replacement therapy (postmenopausal): Secondary | ICD-10-CM

## 2017-10-30 DIAGNOSIS — N12 Tubulo-interstitial nephritis, not specified as acute or chronic: Secondary | ICD-10-CM | POA: Diagnosis present

## 2017-10-30 LAB — CBC WITH DIFFERENTIAL/PLATELET
Basophils Absolute: 0 10*3/uL (ref 0–0.1)
Basophils Relative: 0 %
Eosinophils Absolute: 0 10*3/uL (ref 0–0.7)
Eosinophils Relative: 0 %
HCT: 39.8 % (ref 35.0–47.0)
Hemoglobin: 13.3 g/dL (ref 12.0–16.0)
Lymphocytes Relative: 6 %
Lymphs Abs: 1.3 10*3/uL (ref 1.0–3.6)
MCH: 29.8 pg (ref 26.0–34.0)
MCHC: 33.3 g/dL (ref 32.0–36.0)
MCV: 89.5 fL (ref 80.0–100.0)
Monocytes Absolute: 1.4 10*3/uL — ABNORMAL HIGH (ref 0.2–0.9)
Monocytes Relative: 6 %
Neutro Abs: 19.7 10*3/uL — ABNORMAL HIGH (ref 1.4–6.5)
Neutrophils Relative %: 88 %
Platelets: 219 10*3/uL (ref 150–440)
RBC: 4.44 MIL/uL (ref 3.80–5.20)
RDW: 15 % — ABNORMAL HIGH (ref 11.5–14.5)
WBC: 22.5 10*3/uL — ABNORMAL HIGH (ref 3.6–11.0)

## 2017-10-30 LAB — LIPASE, BLOOD: Lipase: 25 U/L (ref 11–51)

## 2017-10-30 LAB — COMPREHENSIVE METABOLIC PANEL
ALT: 16 U/L (ref 0–44)
AST: 17 U/L (ref 15–41)
Albumin: 3.9 g/dL (ref 3.5–5.0)
Alkaline Phosphatase: 77 U/L (ref 38–126)
Anion gap: 9 (ref 5–15)
BUN: 12 mg/dL (ref 6–20)
CO2: 23 mmol/L (ref 22–32)
Calcium: 8.4 mg/dL — ABNORMAL LOW (ref 8.9–10.3)
Chloride: 102 mmol/L (ref 98–111)
Creatinine, Ser: 0.74 mg/dL (ref 0.44–1.00)
GFR calc Af Amer: >60 mL/min (ref >60)
GFR calc non Af Amer: >60 mL/min (ref >60)
Glucose, Bld: 146 mg/dL — ABNORMAL HIGH (ref 70–99)
Potassium: 3.5 mmol/L (ref 3.5–5.1)
Sodium: 134 mmol/L — ABNORMAL LOW (ref 135–145)
Total Bilirubin: 0.9 mg/dL (ref 0.3–1.2)
Total Protein: 7.1 g/dL (ref 6.5–8.1)

## 2017-10-30 LAB — MONONUCLEOSIS SCREEN: Mono Screen: NEGATIVE

## 2017-10-30 LAB — LACTIC ACID, PLASMA: Lactic Acid, Venous: 1 mmol/L (ref 0.5–1.9)

## 2017-10-30 MED ORDER — SODIUM CHLORIDE 0.9 % IV BOLUS
1000.0000 mL | Freq: Once | INTRAVENOUS | Status: AC
  Administered 2017-10-30: 1000 mL via INTRAVENOUS

## 2017-10-30 NOTE — ED Provider Notes (Signed)
[facility] Emergency Department Provider Note  ____________________________________________  Time seen: Approximately 9:27 PM  I have reviewed the triage vital signs and the nursing notes.   HISTORY  Chief Complaint Cough and Nasal Congestion    HPI Felicia Manning is a 62 y.o. female who presents the emergency department complaining of fever, malaise and fatigue, nasal congestion, sore throat, cough, emesis.  Patient reports that there is been in a "illness" going through the household.  The patient reports that her adult daughter has had significant fatigue, nausea, sore throat but has not had the nasal congestion or cough.  Patient's grandson children have also had similar symptoms.  Patient is concerned as she has had several episodes of sepsis in the last year, most recently was urosepsis.  Patient reports that she feels overwhelmingly fatigued, with coughing her second worst symptom.  Patient denies any headache, neck pain or stiffness, chest pain, shortness of breath, abdominal pain, diarrhea or constipation.  Patient reports that emesis occurred once, was mostly bilious.  No blood in vomit or stool.  She denies any urinary symptoms at this time.  Patient is tried over-the-counter medications with no relief of symptoms.   Patient has a history of sepsis, Barrett's esophagus, hepatitis, hypothyroidism, hypertension, repeat UTIs.   Past Medical History:  Diagnosis Date  . Barrett's esophagus   . Dysrhythmia    stress related at work.  Marland Kitchen GERD (gastroesophageal reflux disease)   . Hepatitis C 2008  . Hyperlipidemia   . Hypertension   . Hypothyroidism   . Nephrolithiasis   . Osteoporosis   . Thyroid cancer ([place]) 2012  . Thyroid cancer [facility])     Patient Active Problem List   Diagnosis Date Noted  . Sepsis ([place]) 08/13/2017  . Acute diverticulitis 06/18/2017  . 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 Fungal infection of toenail 06/11/2015  .  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 Barrett esophagus 05/02/2015  . Acid reflux 05/02/2015  . HCV (hepatitis C virus) 05/02/2015  . HLD (hyperlipidemia) 05/02/2015  . BP (high blood pressure) 05/02/2015  . Adult hypothyroidism 05/02/2015  . Elevated WBC count 05/02/2015  . 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[place]) 05/02/2015  . Kidney stone   . Pyelonephritis   . UTI (lower urinary tract infection) 04/08/2015  . Flank pain 04/08/2015  . Chronic hepatitis C virus infection ([place]) 03/12/2015  . Essential (primary) hypertension 03/12/2015  . Gastro-esophageal reflux disease without esophagitis 03/12/2015  . Hypothyroidism, postop 03/12/2015  . H/O malignant neoplasm of thyroid 01/29/2014  . OP (osteoporosis) 10/06/2010  . Current tobacco use 10/06/2010  . Tobacco use 10/06/2010    Past Surgical History:  Procedure Laterality Date  . ABDOMINAL HYSTERECTOMY  2006  . COLONOSCOPY WITH PROPOFOL N/A 04/25/2015   Procedure: COLONOSCOPY WITH PROPOFOL;  Surgeon: Josefine Class, MD;  Location[facility] ENDOSCOPY;  Service: Endoscopy;  Laterality: N/A;  . CYSTOSCOPY W/ RETROGRADES Bilateral 05/19/2015   Procedure: CYSTOSCOPY WITH RETROGRADE PYELOGRAM;  Surgeon: Hollice Espy, MD;  Location: [facility] ORS;  Service: Urology;  Laterality: Bilateral;  . CYSTOSCOPY W/ URETERAL STENT PLACEMENT Right 05/26/2015   Procedure: CYSTOSCOPY WITH STENT REPLACEMENT;  Surgeon: Hollice Espy, MD;  Location: [facility] ORS;  Service: Urology;  Laterality: Right;  . CYSTOSCOPY WITH STENT PLACEMENT Right 05/19/2015   Procedure: CYSTOSCOPY WITH STENT PLACEMENT;  Surgeon: Hollice Espy, MD;  Location: [facility] ORS;  Service: Urology;  Laterality: Right;  . CYSTOSCOPY WITH STENT PLACEMENT Right 05/26/2015   Procedure: CYSTOSCOPY WITH STENT PLACEMENT;  Surgeon: Hollice Espy, MD;  Location: [facility] ORS;  Service: Urology;  Laterality: Right;  . CYSTOSCOPY WITH STENT PLACEMENT Right 06/25/2016   Procedure: CYSTOSCOPY WITH STENT  PLACEMENT;  Surgeon: Nickie Retort, MD;  Location: [facility] ORS;  Service: Urology;  Laterality: Right;  . CYSTOSCOPY/URETEROSCOPY/HOLMIUM LASER/STENT PLACEMENT Left 07/23/2015   Procedure: CYSTOSCOPY/URETEROSCOPY/HOLMIUM LASER/STENT PLACEMENT/RETROGRADE PYELOGRAM;  Surgeon: Hollice Espy, MD;  Location: [facility] ORS;  Service: Urology;  Laterality: Left;  . ESOPHAGOGASTRODUODENOSCOPY (EGD) WITH PROPOFOL N/A 04/25/2015   Procedure: ESOPHAGOGASTRODUODENOSCOPY (EGD) WITH PROPOFOL;  Surgeon: Josefine Class, MD;  Location: [facility] ENDOSCOPY;  Service: Endoscopy;  Laterality: N/A;  . EYE SURGERY Bilateral 1964   lazy eye repair  . EYE SURGERY Bilateral 2001   lazy eye repair  . LITHOTRIPSY  2009  . THYROIDECTOMY  2010  . URETEROSCOPY WITH HOLMIUM LASER LITHOTRIPSY Right 05/19/2015   Procedure: URETEROSCOPY WITH HOLMIUM LASER LITHOTRIPSY;  Surgeon: Hollice Espy, MD;  Location: [facility] ORS;  Service: Urology;  Laterality: Right;  . URETEROSCOPY WITH HOLMIUM LASER LITHOTRIPSY Right 05/26/2015   Procedure: URETEROSCOPY WITH HOLMIUM LASER LITHOTRIPSY;  Surgeon: Hollice Espy, MD;  Location: [facility] ORS;  Service: Urology;  Laterality: Right;  . URETEROSCOPY WITH HOLMIUM LASER LITHOTRIPSY Right 06/25/2016   Procedure: URETEROSCOPY WITH HOLMIUM LASER LITHOTRIPSY;  Surgeon: Nickie Retort, MD;  Location: [facility] ORS;  Service: Urology;  Laterality: Right;    Prior to Admission medications   Medication Sig Start Date End Date Taking? Authorizing Provider  amLODipine (NORVASC) 5 MG tablet Take 5 mg by mouth daily.    [provider]  cholecalciferol (VITAMIN D) 1000 units tablet Take 2,000 Units by mouth daily.    [provider]  levothyroxine (SYNTHROID, LEVOTHROID) 150 MCG tablet Take 150 mcg by mouth at bedtime.     [provider]  metoprolol succinate (TOPROL-XL) 25 MG 24 hr tablet Take 25 mg by mouth every morning.     [provider]  nicotine (NICODERM CQ - DOSED IN  MG/24 HOURS) 21 mg/24hr patch Place 1 patch (21 mg total) onto the skin daily. 08/16/17   Gouru, Illene Silver, MD  omeprazole (PRILOSEC) 20 MG capsule Take 20 mg by mouth every morning.    [provider]    Allergies Patient has no known allergies.  Family History  Problem Relation Age of Onset  . CAD Mother   . Heart disease Mother   . Dementia Father   . Bladder Cancer Neg Hx   . Prostate cancer Neg Hx   . Kidney cancer Neg Hx     Social History Social History   Tobacco Use  . Smoking status: Current Every Day Smoker    Packs/day: 1.00    Years: 40.00    Pack years: 40.00    Types: Cigarettes  . Smokeless tobacco: Never Used  Substance Use Topics  . Alcohol use: Yes    Comment: socially  . Drug use: No     Review of Systems  Constitutional: Positive fever/chills Eyes: No visual changes. No discharge ENT: Positive for nasal congestion, sore throat Cardiovascular: no chest pain. Respiratory: Positive cough. No SOB. Gastrointestinal: No abdominal pain.  One episode of nausea with emesis.  No diarrhea.  No constipation. Genitourinary: Negative for dysuria. No hematuria Musculoskeletal: Negative for musculoskeletal pain. Skin: Negative for rash, abrasions, lacerations, ecchymosis. Neurological: Negative for headaches, focal weakness or numbness. 10-point ROS otherwise negative.  ____________________________________________   PHYSICAL EXAM:  VITAL SIGNS: ED Triage Vitals  Enc Vitals Group  BP 10/30/17 2115 135/69     Pulse Rate 10/30/17 2115 97     Resp 10/30/17 2115 18     Temp 10/30/17 2116 99.1 F (37.3 C)     Temp Source 10/30/17 2116 Oral     SpO2 10/30/17 2115 95 %     Weight 10/30/17 2116 180 lb (81.6 kg)     Height 10/30/17 2116 5\' 7"  (1.702 m)     Head Circumference --      Peak Flow --      Pain Score 10/30/17 2116 3     Pain Loc --      Pain Edu? --      Excl. in [place]? --      Constitutional: Alert and oriented. Well appearing and in  no acute distress. Eyes: Conjunctivae are normal. PERRL. EOMI. Head: Atraumatic. ENT:      Ears: EACs and TMs unremarkable bilaterally.      Nose: No congestion/rhinnorhea.      Mouth/Throat: Mucous membranes are moist.  Oropharynx is moderately erythematous, uvula is midline, no exudates. Neck: No stridor.  Neck is supple full range of motion Hematological/Lymphatic/Immunilogical: Diffuse, mobile, nontender cervical lymphadenopathy. Cardiovascular: Normal rate, regular rhythm. Normal S1 and S2.  Good peripheral circulation. Respiratory: Normal respiratory effort without tachypnea or retractions. Lungs with a few scattered expiratory wheezes.  No rales or rhonchi.Kermit Balo air entry to the bases with no decreased or absent breath sounds. Gastrointestinal: Bowel sounds 4 quadrants. Soft and nontender to palpation. No guarding or rigidity. No palpable masses. No distention. No CVA tenderness. Musculoskeletal: Full range of motion to all extremities. No gross deformities appreciated. Neurologic:  Normal speech and language. No gross focal neurologic deficits are appreciated.  Skin:  Skin is warm, dry and intact. No rash noted. Psychiatric: Mood and affect are normal. Speech and behavior are normal. Patient exhibits appropriate insight and judgement.   ____________________________________________   LABS (all labs ordered are listed, but only abnormal results are displayed)  Labs Reviewed  COMPREHENSIVE METABOLIC PANEL - Abnormal; Notable for the following components:      Result Value   Sodium 134 (*)    Glucose, Bld 146 (*)    Calcium 8.4 (*)    All other components within normal limits  CBC WITH DIFFERENTIAL/PLATELET - Abnormal; Notable for the following components:   WBC 22.5 (*)    RDW 15.0 (*)    Neutro Abs 19.7 (*)    Monocytes Absolute 1.4 (*)    All other components within normal limits  CULTURE, BLOOD (ROUTINE X 2)  CULTURE, BLOOD (ROUTINE X 2)  MONONUCLEOSIS SCREEN  LACTIC  ACID, PLASMA  LIPASE, BLOOD  URINALYSIS, COMPLETE (UACMP) WITH MICROSCOPIC  LACTIC ACID, PLASMA   ____________________________________________  EKG   ____________________________________________  RADIOLOGY I personally viewed and evaluated these images as part of my medical decision making, as well as reviewing the written report by the radiologist.  I concur with radiologist finding of no acute consolidation.  Likely atelectasis lower lung fields.  Dg Chest 2 View  Result Date: 10/30/2017 CLINICAL DATA:  Cough, congestion, and fever starting last night. Fatigue. EXAM: CHEST - 2 VIEW COMPARISON:  08/14/2017 FINDINGS: Heart size and pulmonary vascularity are normal. Linear fibrosis or atelectasis in the lung bases is similar to previous study. No airspace disease or consolidation. No blunting of costophrenic angles. No pneumothorax. Mediastinal contours appear intact. Calcification of the aorta. Mild degenerative changes in the spine. IMPRESSION: Linear fibrosis or atelectasis in the  lung bases. No evidence of active pulmonary disease. Electronically Signed   By: Lucienne Capers M.D.   On: 10/30/2017 22:13    ____________________________________________    PROCEDURES  Procedure(s) performed:    Procedures    Medications  sodium chloride 0.9 % bolus 1,000 mL (1,000 mLs Intravenous New Bag/Given 10/30/17 2236)     ____________________________________________   INITIAL IMPRESSION / ASSESSMENT AND PLAN / ED COURSE  Pertinent labs & imaging results that were available during my care of the patient were reviewed by me and considered in my medical decision making (see chart for details).  Review of the [place] CSRS was performed in accordance of the [place] prior to dispensing any controlled drugs.  Clinical Course as of Oct 31 2335  Nancy Fetter Oct 30, 2017  2139 Patient presented to the emergency department with a complaint of nasal congestion, sore throat, cough, fever, fatigue, emesis.   Patient has 2-day history of symptoms.  At this time, exam is overall reassuring.  Symptoms are most likely viral in nature, however given patient's history and symptoms, patient will be evaluated with CBC, CMP, mono.  Patient does have grandkids in the age distribution for mono, both the patient and her daughter have began to experience symptoms with extreme fatigue.  Differential includes sinusitis, viral URI, viral pharyngitis, mono, strep, bronchitis, pneumonia.   [JC]  2220 Patient's white blood cell count returns at 22.5 thousand.  I discussed the findings with the patient who states that she still feels, weak.  She denies any nausea or vomiting at this time.  Secondary assessment so reveals the patient in no acute distress.  Given her laboratory findings, urinalysis, lactic, lipase, blood cultures are ordered.  Still pending further results at this time.   [JC]  2335 Patient's of the labs returned with reassuring results.  Lactic acid came back at 1.0.  Patient had no other significant findings on labs.  Urinalysis was still pending at this time.  This section in the emergency department is closing, so the patient is transferred to major side of the emergency department to room 16.  Patient care is transferred to Dr. Beather Arbour.  Patient's history, physical exam, work-up, differential was discussed with Dr. Beather Arbour.  She accepts the patient at this time.   [JC]  2336 At this time, differential includes viral illness, UTI.  Awaiting urinalysis at this time.  If patient's urine does present with an indication of UTI, patient would likely be suited with IV antibiotics, possible admission.  If patient's work-up returns with a negative urine, this is likely viral in nature and patient would likely be able to be discharged home.  Final disposition and diagnosis will be provided by Dr. Beather Arbour.   [JC]    Clinical Course User Index [JC] Cuthriell, Charline Bills, PA-C       Patient presented to the emergency  department with fatigue, malaise, nasal congestion, sore throat, cough, emesis.  Patient's symptoms sounded viral in nature, patient was overall reassuring on exam.  Patient had no significant work of breathing, minimal expiratory wheezes, reassuring ENT and GI exam.  No CVA tenderness.  Initial differential was likely viral in nature, however given patient's symptoms and history, basic labs, chest x-ray, mono are ordered.  Patient's labs returned with a 22.5 thousand white blood cell count.  Further work-up was initiated.  As I discussed the patient's symptoms further in depth, she states that last encounter with emergency department she was diagnosed with urosepsis.  She states that that  time she had been treated for a UTI previously but did not at that time have any urinary symptoms when diagnosed with urosepsis.  Patient's labs have overall returned reassuring.  Chest x-ray reveals atelectasis but no consolidation.  Lipase within normal limits.  Lactic was 1.0.  Patient's urinalysis was still outstanding at this time.  This section of the emergency department closed, patient was transferred to the major side emergency department.  I discussed the patient's case with Dr. Beather Arbour who is advised of the patient's HPI, physical exam, laboratory results, radiology results, likely differential and disposition.  At this time, differential is likely viral versus UTI.  However, final disposition and diagnosis will be provided by Dr. Beather Arbour.  She accepts the patient at this time.   ____________________________________________  FINAL CLINICAL IMPRESSION(S) / ED DIAGNOSES  Differential:  Viral illness  UTI    This chart was dictated using voice recognition software/Dragon. Despite best efforts to proofread, errors can occur which can change the meaning. Any change was purely unintentional.    Darletta Moll, PA-C 10/30/17 2343    Paulette Blanch, MD 10/31/17 [phone]

## 2017-10-30 NOTE — ED Triage Notes (Signed)
Patient with complaint of cough, congestion and fever that started last night. Patient reports temp of 100 at home.

## 2017-10-30 NOTE — ED Notes (Signed)
Patient transported to X-ray 

## 2017-10-31 ENCOUNTER — Encounter
Admission: EM | Disposition: A | Discharge status: Home / Self Care | Payer: Self-pay | Source: Home / Self Care | Attending: Internal Medicine

## 2017-10-31 ENCOUNTER — Emergency Department: Payer: 59

## 2017-10-31 ENCOUNTER — Other Ambulatory Visit: Payer: Self-pay

## 2017-10-31 ENCOUNTER — Inpatient Hospital Stay: Payer: 59 | Admitting: Anesthesiology

## 2017-10-31 ENCOUNTER — Encounter: Payer: Self-pay | Admitting: Internal Medicine

## 2017-10-31 DIAGNOSIS — N200 Calculus of kidney: Secondary | ICD-10-CM | POA: Diagnosis present

## 2017-10-31 DIAGNOSIS — B1920 Unspecified viral hepatitis C without hepatic coma: Secondary | ICD-10-CM | POA: Diagnosis present

## 2017-10-31 DIAGNOSIS — Z85850 Personal history of malignant neoplasm of thyroid: Secondary | ICD-10-CM | POA: Diagnosis not present

## 2017-10-31 DIAGNOSIS — Z79899 Other long term (current) drug therapy: Secondary | ICD-10-CM | POA: Diagnosis not present

## 2017-10-31 DIAGNOSIS — N390 Urinary tract infection, site not specified: Secondary | ICD-10-CM | POA: Diagnosis present

## 2017-10-31 DIAGNOSIS — E039 Hypothyroidism, unspecified: Secondary | ICD-10-CM | POA: Diagnosis present

## 2017-10-31 DIAGNOSIS — R739 Hyperglycemia, unspecified: Secondary | ICD-10-CM | POA: Diagnosis present

## 2017-10-31 DIAGNOSIS — E871 Hypo-osmolality and hyponatremia: Secondary | ICD-10-CM | POA: Diagnosis present

## 2017-10-31 DIAGNOSIS — N201 Calculus of ureter: Secondary | ICD-10-CM | POA: Diagnosis present

## 2017-10-31 DIAGNOSIS — R509 Fever, unspecified: Secondary | ICD-10-CM | POA: Diagnosis not present

## 2017-10-31 DIAGNOSIS — N12 Tubulo-interstitial nephritis, not specified as acute or chronic: Secondary | ICD-10-CM

## 2017-10-31 DIAGNOSIS — A419 Sepsis, unspecified organism: Secondary | ICD-10-CM | POA: Diagnosis present

## 2017-10-31 DIAGNOSIS — Z79890 Hormone replacement therapy (postmenopausal): Secondary | ICD-10-CM | POA: Diagnosis not present

## 2017-10-31 DIAGNOSIS — I10 Essential (primary) hypertension: Secondary | ICD-10-CM | POA: Diagnosis present

## 2017-10-31 HISTORY — PX: CYSTOSCOPY WITH STENT PLACEMENT: SHX5790

## 2017-10-31 LAB — URINALYSIS, COMPLETE (UACMP) WITH MICROSCOPIC
Bilirubin Urine: NEGATIVE
Glucose, UA: NEGATIVE mg/dL
Ketones, ur: 5 mg/dL — AB
Nitrite: POSITIVE — AB
Protein, ur: NEGATIVE mg/dL
Specific Gravity, Urine: 1.009 (ref 1.005–1.030)
pH: 6 (ref 5.0–8.0)

## 2017-10-31 LAB — LACTIC ACID, PLASMA: Lactic Acid, Venous: 1 mmol/L (ref 0.5–1.9)

## 2017-10-31 LAB — PHOSPHORUS: Phosphorus: 2.2 mg/dL — ABNORMAL LOW (ref 2.5–4.6)

## 2017-10-31 SURGERY — CYSTOSCOPY, WITH STENT INSERTION
Anesthesia: General | Laterality: Left

## 2017-10-31 MED ORDER — LACTATED RINGERS IV SOLN
INTRAVENOUS | Status: DC | PRN
  Administered 2017-10-31: 11:00:00 via INTRAVENOUS

## 2017-10-31 MED ORDER — PHENYLEPHRINE HCL 10 MG/ML IJ SOLN
INTRAMUSCULAR | Status: DC | PRN
  Administered 2017-10-31 (×3): 50 ug via INTRAVENOUS

## 2017-10-31 MED ORDER — ONDANSETRON HCL 4 MG/2ML IJ SOLN: 4.0000 mg | Freq: Once | INTRAMUSCULAR | Status: DC | PRN

## 2017-10-31 MED ORDER — FENTANYL CITRATE (PF) 100 MCG/2ML IJ SOLN
INTRAMUSCULAR | Status: DC | PRN
  Administered 2017-10-31: 100 ug via INTRAVENOUS

## 2017-10-31 MED ORDER — FENTANYL CITRATE (PF) 100 MCG/2ML IJ SOLN: 25.0000 ug | INTRAMUSCULAR | Status: DC | PRN

## 2017-10-31 MED ORDER — METOPROLOL SUCCINATE ER 25 MG PO TB24
25.0000 mg | ORAL_TABLET | Freq: Every morning | ORAL | Status: DC
  Administered 2017-10-31 – 2017-11-01 (×2): 25 mg via ORAL
  Filled 2017-10-31 (×4): qty 1

## 2017-10-31 MED ORDER — PROPOFOL 10 MG/ML IV BOLUS
INTRAVENOUS | Status: DC | PRN
  Administered 2017-10-31: 140 mg via INTRAVENOUS

## 2017-10-31 MED ORDER — ONDANSETRON HCL 4 MG/2ML IJ SOLN
INTRAMUSCULAR | Status: DC | PRN
  Administered 2017-10-31: 4 mg via INTRAVENOUS

## 2017-10-31 MED ORDER — PANTOPRAZOLE SODIUM 40 MG PO TBEC
40.0000 mg | DELAYED_RELEASE_TABLET | Freq: Every day | ORAL | Status: DC
  Administered 2017-10-31 – 2017-11-01 (×2): 40 mg via ORAL
  Filled 2017-10-31 (×2): qty 1

## 2017-10-31 MED ORDER — ONDANSETRON HCL 4 MG PO TABS: 4.0000 mg | ORAL_TABLET | Freq: Four times a day (QID) | ORAL | Status: DC | PRN

## 2017-10-31 MED ORDER — SODIUM CHLORIDE 0.9 % IV SOLN
500.0000 mg | Freq: Once | INTRAVENOUS | Status: DC
  Filled 2017-10-31 (×2): qty 500

## 2017-10-31 MED ORDER — FENTANYL CITRATE (PF) 100 MCG/2ML IJ SOLN
INTRAMUSCULAR | Status: AC
  Filled 2017-10-31: qty 2

## 2017-10-31 MED ORDER — PROPOFOL 10 MG/ML IV BOLUS
INTRAVENOUS | Status: AC
  Filled 2017-10-31: qty 20

## 2017-10-31 MED ORDER — ACETAMINOPHEN 325 MG PO TABS
650.0000 mg | ORAL_TABLET | Freq: Four times a day (QID) | ORAL | Status: DC | PRN
  Administered 2017-10-31: 650 mg via ORAL
  Filled 2017-10-31: qty 2

## 2017-10-31 MED ORDER — LEVOTHYROXINE SODIUM 50 MCG PO TABS
150.0000 ug | ORAL_TABLET | Freq: Every day | ORAL | Status: DC
  Administered 2017-10-31: 150 ug via ORAL
  Filled 2017-10-31: qty 1

## 2017-10-31 MED ORDER — LIDOCAINE HCL (CARDIAC) PF 100 MG/5ML IV SOSY
PREFILLED_SYRINGE | INTRAVENOUS | Status: DC | PRN
  Administered 2017-10-31: 80 mg via INTRAVENOUS

## 2017-10-31 MED ORDER — ONDANSETRON HCL 4 MG/2ML IJ SOLN
INTRAMUSCULAR | Status: AC
  Filled 2017-10-31: qty 2

## 2017-10-31 MED ORDER — ONDANSETRON HCL 4 MG/2ML IJ SOLN: 4.0000 mg | Freq: Four times a day (QID) | INTRAMUSCULAR | Status: DC | PRN

## 2017-10-31 MED ORDER — BISACODYL 5 MG PO TBEC: 5.0000 mg | DELAYED_RELEASE_TABLET | Freq: Every day | ORAL | Status: DC | PRN

## 2017-10-31 MED ORDER — IOTHALAMATE MEGLUMINE 43 % IV SOLN
INTRAVENOUS | Status: DC | PRN
  Administered 2017-10-31: 5 mL

## 2017-10-31 MED ORDER — AMLODIPINE BESYLATE 5 MG PO TABS
5.0000 mg | ORAL_TABLET | Freq: Every day | ORAL | Status: DC
  Administered 2017-11-01: 5 mg via ORAL
  Filled 2017-10-31: qty 1

## 2017-10-31 MED ORDER — ENOXAPARIN SODIUM 40 MG/0.4ML ~~LOC~~ SOLN
40.0000 mg | SUBCUTANEOUS | Status: DC
  Administered 2017-10-31 – 2017-11-01 (×2): 40 mg via SUBCUTANEOUS
  Filled 2017-10-31 (×2): qty 0.4

## 2017-10-31 MED ORDER — MORPHINE SULFATE (PF) 2 MG/ML IV SOLN: 2.0000 mg | INTRAVENOUS | Status: DC | PRN

## 2017-10-31 MED ORDER — SODIUM CHLORIDE FLUSH 0.9 % IV SOLN
INTRAVENOUS | Status: AC
  Administered 2017-10-31: 11:00:00
  Filled 2017-10-31: qty 10

## 2017-10-31 MED ORDER — ACETAMINOPHEN 650 MG RE SUPP: 650.0000 mg | Freq: Four times a day (QID) | RECTAL | Status: DC | PRN

## 2017-10-31 MED ORDER — SODIUM CHLORIDE 0.9 % IV SOLN
2.0000 g | INTRAVENOUS | Status: DC
  Administered 2017-11-01: 2 g via INTRAVENOUS
  Filled 2017-10-31 (×2): qty 20

## 2017-10-31 MED ORDER — LACTATED RINGERS IV SOLN
INTRAVENOUS | Status: DC
  Administered 2017-10-31: 06:00:00 via INTRAVENOUS

## 2017-10-31 MED ORDER — ONDANSETRON HCL 4 MG/2ML IJ SOLN
4.0000 mg | Freq: Once | INTRAMUSCULAR | Status: AC
  Administered 2017-10-31: 4 mg via INTRAVENOUS

## 2017-10-31 MED ORDER — SODIUM CHLORIDE 0.9 % IV SOLN
2.0000 g | Freq: Once | INTRAVENOUS | Status: AC
  Administered 2017-10-31: 2 g via INTRAVENOUS
  Filled 2017-10-31: qty 20

## 2017-10-31 MED ORDER — SENNOSIDES-DOCUSATE SODIUM 8.6-50 MG PO TABS: 1.0000 | ORAL_TABLET | Freq: Every evening | ORAL | Status: DC | PRN

## 2017-10-31 SURGICAL SUPPLY — 20 items
BAG DRAIN CYSTO-URO LG1000N (MISCELLANEOUS) ×2 IMPLANT
BRUSH SCRUB EZ  4% CHG (MISCELLANEOUS)
BRUSH SCRUB EZ 4% CHG (MISCELLANEOUS) IMPLANT
CATH URETL 5X70 OPEN END (CATHETERS) ×2 IMPLANT
CONRAY 43 FOR UROLOGY 50M (MISCELLANEOUS) ×2 IMPLANT
GLOVE BIO SURGEON STRL SZ 6.5 (GLOVE) ×2 IMPLANT
GOWN STRL REUS W/ TWL LRG LVL3 (GOWN DISPOSABLE) ×2 IMPLANT
GOWN STRL REUS W/TWL LRG LVL3 (GOWN DISPOSABLE) ×2
KIT TURNOVER CYSTO (KITS) ×2 IMPLANT
PACK CYSTO AR (MISCELLANEOUS) ×2 IMPLANT
SENSORWIRE 0.038 NOT ANGLED (WIRE) ×2
SET CYSTO W/LG BORE CLAMP LF (SET/KITS/TRAYS/PACK) ×2 IMPLANT
SOL .9 NS 3000ML IRR  AL (IV SOLUTION) ×1
SOL .9 NS 3000ML IRR UROMATIC (IV SOLUTION) ×1 IMPLANT
STENT URET 6FRX24 CONTOUR (STENTS) ×2 IMPLANT
STENT URET 6FRX26 CONTOUR (STENTS) ×2 IMPLANT
SURGILUBE 2OZ TUBE FLIPTOP (MISCELLANEOUS) ×2 IMPLANT
SYRINGE IRR TOOMEY STRL 70CC (SYRINGE) ×2 IMPLANT
WATER STERILE IRR 1000ML POUR (IV SOLUTION) ×2 IMPLANT
WIRE SENSOR 0.038 NOT ANGLED (WIRE) ×1 IMPLANT

## 2017-10-31 NOTE — Op Note (Signed)
Date of procedure: 10/31/17  Preoperative diagnosis:  1. UTI/sepsis 2. Left ureteral stone  Postoperative diagnosis:  1. Same as above  Procedure: 1. Cystoscopy 2. Left retrograde pyelogram 3. Left ureteral stent placement  Surgeon: Hollice Espy, MD  Anesthesia: General  Complications: None  Intraoperative findings: Small filling defect near the iliacs consistent with ?  Small stone with minimal dilation of the renal pelvis and proximal mid ureter down to this level.  EBL: Minimal  Specimens: None  Drains: 6 x 26 French double-J ureteral stent on left  Indication: Felicia Manning is a 62 y.o. patient with admitted with sepsis of probable urinary source along with a probable left mid ureteral calculus with partial obstruction of the collecting system.  After reviewing the management options for treatment, she elected to proceed with the above surgical procedure(s). We have discussed the potential benefits and risks of the procedure, side effects of the proposed treatment, the likelihood of the patient achieving the goals of the procedure, and any potential problems that might occur during the procedure or recuperation. Informed consent has been obtained.  Description of procedure:  The patient was taken to the operating room and general anesthesia was induced.  The patient was placed in the dorsal lithotomy position, prepped and draped in the usual sterile fashion, and no additional antibiotics were administered as a been given earlier this morning. A preoperative time-out was performed.   A 21 French cystoscope was advanced per urethra into the bladder.  Attention was turned to the left ureteral orifice.  There was a fair amount of debris and nonspecific mild diffuse bladder erythema.  Attention was turned to the left ureteral orifice which was cannulated using a 5 [place] open-ended ureteral catheter.  Extremely gentle retrograde pyelogram was performed to outline the collecting  system and a small defect was notable at the level of the iliacs concerning for possible stone at this level although the hydronephrosis was relatively minimal but with some fullness on the side.  Wires in place up to level the kidney without difficulty.  Initial place a 6 x 24 French double-J ureteral stent, however upon partially withdrawing the wire, I noted that the stent appeared to be short with a coil in the proximal ureter rather than the renal pelvis.  As such, the wire was left in place and stenosis exchanged for a 6 x 26 French double-J ureteral stent.  The wire was then partially withdrawn and a focal stone within the renal pelvis and the appropriate position.  The wires and fully withdrawn a full coils noted within the bladder.  Upon placement of the stent, there was some debris noted to effluxed from the left stent concerning for infection in the upper tract.  The bladder was then drained.  The patient was then clean and dry, repositioned supine position, reversed from anesthesia, taken the PACU in stable condition.  Plan: Patient will remain in-house on IV antibiotics, to be adjusted as needed.  We will follow her closely.  Will likely plan for staged procedure.  Hollice Espy, M.D.

## 2017-10-31 NOTE — ED Provider Notes (Signed)
-----------------------------------------   1:47 AM on 10/31/2017 -----------------------------------------  Results of urinalysis received.  Will initiate 2 g IV Rocephin.  I personally reviewed patient's chart and see that she follows with urology for history of nephrolithiasis as well as pyelonephritis.  It has been over 1 year since her last CT scan.  For this reason will obtain CT renal colic study to evaluate for obstructive kidney stone.  Barring any urological procedures, anticipate patient will be admitted to the hospital for SIRS, UTI.   ----------------------------------------- 3:31 AM on 10/31/2017 -----------------------------------------  CT renal stone study and per Dr. Gerilyn Nestle: 1. Focal areas of consolidation or atelectasis in the lung bases.  2. Moderate esophageal hiatal hernia.  3. Multiple bilateral nonobstructing intrarenal stones. Probable  stone in the mid left ureter with mild left hydroureter, suggesting  a partially obstructing stone.  4. Multiple bilateral renal cysts  5. Focal prominence of the tail of the pancreas probably represents  pancreatic tortuosity rather than a true mass lesion.   Patient sleeping soundly.  Will discuss with hospitalist to evaluate patient in the emergency department for admission.  Azithromycin to cover community-acquired pneumonia.   Paulette Blanch, MD 10/31/17 [phone]

## 2017-10-31 NOTE — H&P (Signed)
[facility] NAME: Felicia Manning    MR#:  [id number]  DATE OF BIRTH:  Sep 26, 1955  DATE OF ADMISSION:  10/30/2017  PRIMARY CARE PHYSICIAN: Glendon Axe, MD   REQUESTING/REFERRING PHYSICIAN: Paulette Blanch, MD  CHIEF COMPLAINT:   Chief Complaint  Patient presents with  . Cough  . Nasal Congestion    HISTORY OF PRESENT ILLNESS:  Felicia Manning  is a 62 y.o. female with a known history of HTN, hypothyroidism, GERD, Hx nephrolithiasis, p/w subjective fever, malaise, cough, sore throat; found to have SIRS, sepsis 2/2 UTI/pyelonephritis, L ureteral stone w/ partial obstruction + hydronephrosis. Pt states that her daughter and two grandsons are staying with her at present. She states the children have had the sniffles. She endorses a 1wk Hx of sinus congestion, and a 1d Hx of cough and sore throat. She states that she had a subjective fever on Saturday 09/14 evening. She states she slept for most of Sunday 09/15. She endorses poor PO intake of food, but states she stayed well hydrated. She had N/V @~noon, and again @~1600PM. She endorses chills and diaphoresis, but denies rigors. She denies abdominal pain or diarrhea, but endorses L flank/L low back pain (x1d). She denies urinary symptoms. Despite meeting criteria for SIRS/sespis, she is remarkably well-appearing, and does not demonstrate an overtly toxic appearance.  PAST MEDICAL HISTORY:   Past Medical History:  Diagnosis Date  . Barrett's esophagus   . Dysrhythmia    stress related at work.  Marland Kitchen GERD (gastroesophageal reflux disease)   . Hepatitis C 2008  . Hyperlipidemia   . Hypertension   . Hypothyroidism   . Nephrolithiasis   . Osteoporosis   . Thyroid cancer ([place]) 2012  . Thyroid cancer ([place])     PAST SURGICAL HISTORY:   Past Surgical History:  Procedure Laterality Date  . ABDOMINAL HYSTERECTOMY  2006  . COLONOSCOPY WITH PROPOFOL N/A 04/25/2015   Procedure:  COLONOSCOPY WITH PROPOFOL;  Surgeon: Josefine Class, MD;  Location: [facility] ENDOSCOPY;  Service: Endoscopy;  Laterality: N/A;  . CYSTOSCOPY W/ RETROGRADES Bilateral 05/19/2015   Procedure: CYSTOSCOPY WITH RETROGRADE PYELOGRAM;  Surgeon: Hollice Espy, MD;  Location: [facility] ORS;  Service: Urology;  Laterality: Bilateral;  . CYSTOSCOPY W/ URETERAL STENT PLACEMENT Right 05/26/2015   Procedure: CYSTOSCOPY WITH STENT REPLACEMENT;  Surgeon: Hollice Espy, MD;  Location: [facility] ORS;  Service: Urology;  Laterality: Right;  . CYSTOSCOPY WITH STENT PLACEMENT Right 05/19/2015   Procedure: CYSTOSCOPY WITH STENT PLACEMENT;  Surgeon: Hollice Espy, MD;  Location: [facility] ORS;  Service: Urology;  Laterality: Right;  . CYSTOSCOPY WITH STENT PLACEMENT Right 05/26/2015   Procedure: CYSTOSCOPY WITH STENT PLACEMENT;  Surgeon: Hollice Espy, MD;  Location: [facility] ORS;  Service: Urology;  Laterality: Right;  . CYSTOSCOPY WITH STENT PLACEMENT Right 06/25/2016   Procedure: CYSTOSCOPY WITH STENT PLACEMENT;  Surgeon: Nickie Retort, MD;  Location: [facility] ORS;  Service: Urology;  Laterality: Right;  . CYSTOSCOPY/URETEROSCOPY/HOLMIUM LASER/STENT PLACEMENT Left 07/23/2015   Procedure: CYSTOSCOPY/URETEROSCOPY/HOLMIUM LASER/STENT PLACEMENT/RETROGRADE PYELOGRAM;  Surgeon: Hollice Espy, MD;  Location: [facility] ORS;  Service: Urology;  Laterality: Left;  . ESOPHAGOGASTRODUODENOSCOPY (EGD) WITH PROPOFOL N/A 04/25/2015   Procedure: ESOPHAGOGASTRODUODENOSCOPY (EGD) WITH PROPOFOL;  Surgeon: Josefine Class, MD;  Location: [facility] ENDOSCOPY;  Service: Endoscopy;  Laterality: N/A;  . EYE SURGERY Bilateral 1964   lazy eye repair  . EYE SURGERY Bilateral 2001   lazy eye repair  . LITHOTRIPSY  2009  . THYROIDECTOMY  2010  . URETEROSCOPY WITH HOLMIUM LASER LITHOTRIPSY Right 05/19/2015   Procedure: URETEROSCOPY WITH HOLMIUM LASER LITHOTRIPSY;  Surgeon: Hollice Espy, MD;  Location: [facility] ORS;  Service: Urology;  Laterality: Right;  . URETEROSCOPY  WITH HOLMIUM LASER LITHOTRIPSY Right 05/26/2015   Procedure: URETEROSCOPY WITH HOLMIUM LASER LITHOTRIPSY;  Surgeon: Hollice Espy, MD;  Location: [facility] ORS;  Service: Urology;  Laterality: Right;  . URETEROSCOPY WITH HOLMIUM LASER LITHOTRIPSY Right 06/25/2016   Procedure: URETEROSCOPY WITH HOLMIUM LASER LITHOTRIPSY;  Surgeon: Nickie Retort, MD;  Location: [facility] ORS;  Service: Urology;  Laterality: Right;    SOCIAL HISTORY:   Social History   Tobacco Use  . Smoking status: Current Every Day Smoker    Packs/day: 1.00    Years: 40.00    Pack years: 40.00    Types: Cigarettes  . Smokeless tobacco: Never Used  Substance Use Topics  . Alcohol use: Yes    Comment: socially    FAMILY HISTORY:   Family History  Problem Relation Age of Onset  . CAD Mother   . Heart disease Mother   . Dementia Father   . Bladder Cancer Neg Hx   . Prostate cancer Neg Hx   . Kidney cancer Neg Hx     DRUG ALLERGIES:  No Known Allergies  REVIEW OF SYSTEMS:   Review of Systems  Constitutional: Positive for chills, diaphoresis, fever and malaise/fatigue. Negative for weight loss.  HENT: Positive for congestion, sinus pain and sore throat. Negative for ear discharge, ear pain and hearing loss.   Eyes: Negative for blurred vision, double vision and photophobia.  Respiratory: Positive for cough. Negative for hemoptysis, sputum production, shortness of breath and wheezing.   Cardiovascular: Negative for chest pain, palpitations, orthopnea, claudication, leg swelling and PND.  Gastrointestinal: Positive for nausea and vomiting. Negative for abdominal pain, blood in stool, constipation, diarrhea, heartburn and melena.  Genitourinary: Positive for flank pain. Negative for dysuria, frequency, hematuria and urgency.  Musculoskeletal: Positive for back pain. Negative for joint pain, myalgias and neck pain.  Skin: Negative for itching and rash.  Neurological: Positive for weakness. Negative for dizziness,  tingling, tremors, sensory change, speech change, focal weakness, seizures, loss of consciousness and headaches.  Psychiatric/Behavioral: Negative for memory loss. The patient does not have insomnia.    MEDICATIONS AT HOME:   Prior to Admission medications   Medication Sig Start Date End Date Taking? Authorizing Provider  amLODipine (NORVASC) 5 MG tablet Take 5 mg by mouth daily.   Yes [provider]  cholecalciferol (VITAMIN D) 1000 units tablet Take 2,000 Units by mouth daily.   Yes [provider]  levothyroxine (SYNTHROID, LEVOTHROID) 150 MCG tablet Take 150 mcg by mouth at bedtime.    Yes [provider]  metoprolol succinate (TOPROL-XL) 25 MG 24 hr tablet Take 25 mg by mouth every morning.    Yes [provider]  omeprazole (PRILOSEC) 20 MG capsule Take 20 mg by mouth every morning.   Yes [provider]     VITAL SIGNS:  Blood pressure 115/66, pulse 86, temperature (!) 100.6 F (38.1 C), temperature source Oral, resp. rate 18, height 5\' 7"  (1.702 m), weight 81.6 kg, SpO2 96 %.  PHYSICAL EXAMINATION:  Physical Exam  Constitutional: She is oriented to person, place, and time. She appears well-developed and well-nourished. She is active and cooperative.  Non-toxic appearance. She does not have a sickly appearance. She does not appear ill. No distress. She is not intubated.  HENT:  Head:  Normocephalic and atraumatic.  Mouth/Throat: Oropharynx is clear and moist. No oropharyngeal exudate.  Eyes: Conjunctivae, EOM and lids are normal. No scleral icterus.  Neck: Neck supple. No JVD present. No thyromegaly present.  Cardiovascular: Normal rate, regular rhythm, S1 normal, S2 normal and normal heart sounds.  No extrasystoles are present. Exam reveals no gallop, no S3, no S4, no distant heart sounds and no friction rub.  No murmur heard. Pulmonary/Chest: Effort normal and breath sounds normal. No accessory muscle usage or stridor. No apnea, no  tachypnea and no bradypnea. She is not intubated. No respiratory distress. She has no decreased breath sounds. She has no wheezes. She has no rhonchi. She has no rales.  Abdominal: Soft. Bowel sounds are normal. She exhibits no distension. There is no tenderness. There is no rigidity, no rebound and no guarding.  Musculoskeletal: Normal range of motion. She exhibits no edema or tenderness.  Lymphadenopathy:    She has no cervical adenopathy.  Neurological: She is alert and oriented to person, place, and time. She is not disoriented.  Skin: Skin is warm, dry and intact. No rash noted. She is not diaphoretic. No erythema.  Psychiatric: She has a normal mood and affect. Her speech is normal and behavior is normal. Judgment and thought content normal. Cognition and memory are normal.   LABORATORY PANEL:   CBC Recent Labs  Lab 10/30/17 2150  WBC 22.5*  HGB 13.3  HCT 39.8  PLT 219   ------------------------------------------------------------------------------------------------------------------  Chemistries  Recent Labs  Lab 10/30/17 2150  NA 134*  K 3.5  CL 102  CO2 23  GLUCOSE 146*  BUN 12  CREATININE 0.74  CALCIUM 8.4*  AST 17  ALT 16  ALKPHOS 77  BILITOT 0.9   ------------------------------------------------------------------------------------------------------------------  Cardiac Enzymes No results for input(s): TROPONINI in the last 168 hours. ------------------------------------------------------------------------------------------------------------------  RADIOLOGY:  Dg Chest 2 View  Result Date: 10/30/2017 CLINICAL DATA:  Cough, congestion, and fever starting last night. Fatigue. EXAM: CHEST - 2 VIEW COMPARISON:  08/14/2017 FINDINGS: Heart size and pulmonary vascularity are normal. Linear fibrosis or atelectasis in the lung bases is similar to previous study. No airspace disease or consolidation. No blunting of costophrenic angles. No pneumothorax. Mediastinal  contours appear intact. Calcification of the aorta. Mild degenerative changes in the spine. IMPRESSION: Linear fibrosis or atelectasis in the lung bases. No evidence of active pulmonary disease. Electronically Signed   By: Lucienne Capers M.D.   On: 10/30/2017 22:13   Ct Renal Stone Study  Result Date: 10/31/2017 CLINICAL DATA:  Fever, malaise, fatigue, nasal congestion, sore throat, cough and emesis. Previous history of renal stones and pyelonephritis. EXAM: CT ABDOMEN AND PELVIS WITHOUT CONTRAST TECHNIQUE: Multidetector CT imaging of the abdomen and pelvis was performed following the standard protocol without IV contrast. COMPARISON:  06/18/2017 FINDINGS: Lower chest: Focal peripheral areas of consolidation or atelectasis in the lung bases. Moderate esophageal hiatal hernia. Hepatobiliary: Cyst in segment 5 of the liver, unchanged. No other focal liver lesions identified. Gallbladder and bile ducts are unremarkable. Pancreas: Focal prominence of the tail of the pancreas seen mainly on the axial images and unchanged since prior study. This probably represents pancreatic tortuosity rather than a true mass lesion. No pancreatic ductal dilatation or surrounding inflammatory changes. Spleen: Normal in size without focal abnormality. Adrenals/Urinary Tract: No adrenal gland nodules. Multiple bilateral renal cysts as seen previously. Multiple intrarenal stones bilaterally, measuring up to about 7 mm diameter. Mild left hydroureter with probable stone in the mid left ureter  at the level of the sacrum. Distal left ureter is relatively decompressed. No hydronephrosis on the right. Bladder is unremarkable. Prominent gonadal vein calcifications are present. Stomach/Bowel: Stomach is within normal limits. Appendix appears normal. No evidence of bowel wall thickening, distention, or inflammatory changes. Vascular/Lymphatic: Aortic atherosclerosis. No enlarged abdominal or pelvic lymph nodes. Reproductive: Status post  hysterectomy. No adnexal masses. Other: No abdominal wall hernia or abnormality. No abdominopelvic ascites. Musculoskeletal: Degenerative changes in the spine and hips. No destructive bone lesions. IMPRESSION: 1. Focal areas of consolidation or atelectasis in the lung bases. 2. Moderate esophageal hiatal hernia. 3. Multiple bilateral nonobstructing intrarenal stones. Probable stone in the mid left ureter with mild left hydroureter, suggesting a partially obstructing stone. 4. Multiple bilateral renal cysts 5. Focal prominence of the tail of the pancreas probably represents pancreatic tortuosity rather than a true mass lesion. Aortic Atherosclerosis (ICD10-I70.0). Electronically Signed   By: Lucienne Capers M.D.   On: 10/31/2017 03:09   IMPRESSION AND PLAN:   A/P: 50F SIRS, sepsis 2/2 UTI/pyelonephritis, L ureteral stone w/ partial obstruction + hydronephrosis. Hyponatremia, hyperglycemia, hypocalcemia, leukocytosis. -SIRS, sepsis, UTI/pyelonephritis, L ureteral stone w/ partial obstruction + hydronephrosis: HR > 90, WBC 22.5, SIRS (+). U/A (+), (+) sepsis. Lactate 1.0. BCx, UCx. Ceftriaxone. Also received a dose of Azithromycin in ED, which is not being continued. IVF. Pain ctrl, antiemetics. Urology consult. -Hyponatremia: IVF. Monitor BMP. -Hyperglycemia: Likely 2/2 infxn. -Hypocalcemia: Ionized calcium. -c/w other home meds. -FEN/GI: Cardiac diet. -DVT PPx: Lovenox. -Code status: Full code. -Disposition: Admission, > 2 midnights.   All the records are reviewed and case discussed with ED provider. Management plans discussed with the patient, family and they are in agreement.  CODE STATUS: Full code.  TOTAL TIME TAKING CARE OF THIS PATIENT: 75 minutes.    Arta Silence M.D on 10/31/2017 at 6:01 AM  Between 7am to 6pm - Pager - [phone]  After 6pm go to [email or web address] - Proofreader  [facility]  Office  [phone]  CC: Primary care  physician; Glendon Axe, MD   Note: This dictation was prepared with Dragon dictation along with smaller phrase technology. Any transcriptional errors that result from this process are unintentional.

## 2017-10-31 NOTE — ED Notes (Signed)
Pt with emesis, sheets changed by dawn, rn.

## 2017-10-31 NOTE — Plan of Care (Signed)
Care plan reviewed with patient. Resting well since admission. Denies any pain or needs at this time.

## 2017-10-31 NOTE — Anesthesia Preprocedure Evaluation (Signed)
Anesthesia Evaluation  Patient identified by MRN, date of birth, ID band Patient awake    Reviewed: Allergy & Precautions, NPO status , Patient's Chart, lab work & pertinent test results  History of Anesthesia Complications Negative for: history of anesthetic complications  Airway Mallampati: III       Dental   Pulmonary neg sleep apnea, neg COPD, Current Smoker,           Cardiovascular hypertension, Pt. on medications (-) Past MI and (-) CHF (-) dysrhythmias (-) Valvular Problems/Murmurs     Neuro/Psych neg Seizures    GI/Hepatic GERD  Medicated and Controlled,(+) Hepatitis - (tx,d), C  Endo/Other  Hypothyroidism   Renal/GU Renal disease (stones)     Musculoskeletal   Abdominal   Peds  Hematology   Anesthesia Other Findings   Reproductive/Obstetrics                             Anesthesia Physical Anesthesia Plan  ASA: III and emergent  Anesthesia Plan: General   Post-op Pain Management:    Induction: Intravenous  PONV Risk Score and Plan: 2 and Ondansetron and Dexamethasone  Airway Management Planned: LMA  Additional Equipment:   Intra-op Plan:   Post-operative Plan:   Informed Consent: I have reviewed the patients History and Physical, chart, labs and discussed the procedure including the risks, benefits and alternatives for the proposed anesthesia with the patient or authorized representative who has indicated his/her understanding and acceptance.     Plan Discussed with:   Anesthesia Plan Comments:         Anesthesia Quick Evaluation

## 2017-10-31 NOTE — Anesthesia Postprocedure Evaluation (Signed)
Anesthesia Post Note  Patient: [name]  Procedure(s) Performed: CYSTOSCOPY WITH STENT PLACEMENT (Left )  Patient location during evaluation: PACU Anesthesia Type: General Level of consciousness: awake and alert Pain management: pain level controlled Vital Signs Assessment: post-procedure vital signs reviewed and stable Respiratory status: spontaneous breathing and respiratory function stable Cardiovascular status: stable Anesthetic complications: no     Last Vitals:  Vitals:   10/31/17 1145 10/31/17 1200  BP: (!) 98/58 (!) 109/56  Pulse: 67 70  Resp: 17 17  Temp:    SpO2: 97% 91%    Last Pain:  Vitals:   10/31/17 1200  TempSrc:   PainSc: 0-No pain                 KEPHART,WILLIAM K

## 2017-10-31 NOTE — Progress Notes (Signed)
Admitted this morning for sepsis with UTI,, 3 mm stone in the left ureter with mild hydro-ureteronephrosis.  Patient went for emergency stent and left ureter by urology.  Continue IV antibiotics, weights, follow blood cultures, urine cultures.  Lab data, medications reviewed.

## 2017-10-31 NOTE — Transfer of Care (Signed)
Immediate Anesthesia Transfer of Care Note  Patient: [name]  Procedure(s) Performed: CYSTOSCOPY WITH STENT PLACEMENT (Left )  Patient Location: PACU  Anesthesia Type:General  Level of Consciousness: awake  Airway & Oxygen Therapy: Patient Spontanous Breathing  Post-op Assessment: Report given to RN  Post vital signs: stable  Last Vitals:  Vitals Value Taken Time  BP    Temp    Pulse 74 10/31/2017 11:29 AM  Resp    SpO2 93 % 10/31/2017 11:29 AM  Vitals shown include unvalidated device data.  Last Pain:  Vitals:   10/31/17 1047  TempSrc:   PainSc: 0-No pain         Complications: No apparent anesthesia complications

## 2017-10-31 NOTE — Consult Note (Signed)
Urology Consult  I have been asked to see the patient by Dr. Jodell Cipro for evaluation and management of left ureteral stone/sepsis.  Chief Complaint: Flank pain, fevers  History of Present Illness: Felicia Manning is a 62 y.o. year old with a personal history of nephrolithiasis and recurrent pyelonephritis who presented to the emergency room with multiple symptoms including nausea vomiting, fevers/ chills left flank pain, and upper respiratory/sinus complaints.  She has minimal voiding complaints.  In the emergency room, her urine is frankly positive.  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  In addition, she had a white count of 22 and was febrile with a low-grade temp of 100.6.  CT abdomen pelvis showed a probable 3 mm stone at the left iliacs with proximal mild hydroureteronephrosis.  She has multiple nonobstructing calculi bilaterally.  She does report that she was having some intermittent left flank pain for several days now.    Past Medical History:  Diagnosis Date  . Barrett's esophagus   . Dysrhythmia    stress related at work.  Marland Kitchen GERD (gastroesophageal reflux disease)   . Hepatitis C 2008  . Hyperlipidemia   . Hypertension   . Hypothyroidism   . Nephrolithiasis   . Osteoporosis   . Thyroid cancer ([place]) 2012  . Thyroid cancer [facility])     Past Surgical History:  Procedure Laterality Date  . ABDOMINAL HYSTERECTOMY  2006  . COLONOSCOPY WITH PROPOFOL N/A 04/25/2015   Procedure: COLONOSCOPY WITH PROPOFOL;  Surgeon: Josefine Class, MD;  Location: [facility] ENDOSCOPY;  Service: Endoscopy;  Laterality: N/A;  . CYSTOSCOPY W/ RETROGRADES Bilateral 05/19/2015   Procedure: CYSTOSCOPY WITH RETROGRADE PYELOGRAM;  Surgeon: Hollice Espy, MD;  Location: [facility] ORS;  Service: Urology;  Laterality: Bilateral;  . CYSTOSCOPY W/ URETERAL STENT PLACEMENT Right 05/26/2015   Procedure: CYSTOSCOPY WITH STENT REPLACEMENT;  Surgeon: Hollice Espy, MD;  Location:  [facility] ORS;  Service: Urology;  Laterality: Right;  . CYSTOSCOPY WITH STENT PLACEMENT Right 05/19/2015   Procedure: CYSTOSCOPY WITH STENT PLACEMENT;  Surgeon: Hollice Espy, MD;  Location: [facility] ORS;  Service: Urology;  Laterality: Right;  . CYSTOSCOPY WITH STENT PLACEMENT Right 05/26/2015   Procedure: CYSTOSCOPY WITH STENT PLACEMENT;  Surgeon: Hollice Espy, MD;  Location: [facility] ORS;  Service: Urology;  Laterality: Right;  . CYSTOSCOPY WITH STENT PLACEMENT Right 06/25/2016   Procedure: CYSTOSCOPY WITH STENT PLACEMENT;  Surgeon: Nickie Retort, MD;  Location: [facility] ORS;  Service: Urology;  Laterality: Right;  . CYSTOSCOPY/URETEROSCOPY/HOLMIUM LASER/STENT PLACEMENT Left 07/23/2015   Procedure: CYSTOSCOPY/URETEROSCOPY/HOLMIUM LASER/STENT PLACEMENT/RETROGRADE PYELOGRAM;  Surgeon: Hollice Espy, MD;  Location: [facility] ORS;  Service: Urology;  Laterality: Left;  . ESOPHAGOGASTRODUODENOSCOPY (EGD) WITH PROPOFOL N/A 04/25/2015   Procedure: ESOPHAGOGASTRODUODENOSCOPY (EGD) WITH PROPOFOL;  Surgeon: Josefine Class, MD;  Location: [facility] ENDOSCOPY;  Service: Endoscopy;  Laterality: N/A;  . EYE SURGERY Bilateral 1964   lazy eye repair  . EYE SURGERY Bilateral 2001   lazy eye repair  . LITHOTRIPSY  2009  . THYROIDECTOMY  2010  . URETEROSCOPY WITH HOLMIUM LASER LITHOTRIPSY Right 05/19/2015   Procedure: URETEROSCOPY WITH HOLMIUM LASER LITHOTRIPSY;  Surgeon: Hollice Espy, MD;  Location: [facility] ORS;  Service: Urology;  Laterality: Right;  . URETEROSCOPY WITH HOLMIUM LASER LITHOTRIPSY Right 05/26/2015   Procedure: URETEROSCOPY WITH HOLMIUM LASER LITHOTRIPSY;  Surgeon: Hollice Espy, MD;  Location: [facility] ORS;  Service: Urology;  Laterality: Right;  . URETEROSCOPY WITH HOLMIUM LASER LITHOTRIPSY Right 06/25/2016   Procedure: URETEROSCOPY WITH  HOLMIUM LASER LITHOTRIPSY;  Surgeon: Nickie Retort, MD;  Location: [facility] ORS;  Service: Urology;  Laterality: Right;    Home Medications:  Current Meds  Medication Sig  .  amLODipine (NORVASC) 5 MG tablet Take 5 mg by mouth daily.  . cholecalciferol (VITAMIN D) 1000 units tablet Take 2,000 Units by mouth daily.  Marland Kitchen levothyroxine (SYNTHROID, LEVOTHROID) 150 MCG tablet Take 150 mcg by mouth at bedtime.   . metoprolol succinate (TOPROL-XL) 25 MG 24 hr tablet Take 25 mg by mouth every morning.   Marland Kitchen omeprazole (PRILOSEC) 20 MG capsule Take 20 mg by mouth every morning.    Allergies: No Known Allergies  Family History  Problem Relation Age of Onset  . CAD Mother   . Heart disease Mother   . Dementia Father   . Bladder Cancer Neg Hx   . Prostate cancer Neg Hx   . Kidney cancer Neg Hx     Social History:  reports that she has been smoking cigarettes. She has a 40.00 pack-year smoking history. She has never used smokeless tobacco. She reports that she drinks alcohol. She reports that she does not use drugs.  ROS: A complete review of systems was performed.  All systems are negative except for pertinent findings as noted.  Physical Exam:  Vital signs in last 24 hours: Temp:  [99.1 F (37.3 C)-100.6 F (38.1 C)] 99.6 F (37.6 C) (09/16 1043) Pulse Rate:  [79-97] 79 (09/16 1043) Resp:  [18] 18 (09/16 1043) BP: (104-135)/(49-70) 114/63 (09/16 1043) SpO2:  [91 %-96 %] 95 % (09/16 1043) Weight:  [81.6 kg] 81.6 kg (09/15 2116) Constitutional:  Alert and oriented, No acute distress HEENT: [place] AT, moist mucus membranes.  Trachea midline, no masses Cardiovascular: Regular rate and rhythm, no clubbing, cyanosis, or edema. Respiratory: No respiratory distress, no increased work of breathing. GI: Abdomen is soft, nontender, nondistended, no abdominal masses GU: Mild left CVA tenderness Skin: No rashes, bruises or suspicious lesions Neurologic: Grossly intact, no focal deficits, moving all 4 extremities Psychiatric: Normal mood and affect   Laboratory Data:  Recent Labs    10/30/17 2150  WBC 22.5*  HGB 13.3  HCT 39.8   Recent Labs    10/30/17 2150  NA  134*  K 3.5  CL 102  CO2 23  GLUCOSE 146*  BUN 12  CREATININE 0.74  CALCIUM 8.4*   No results for input(s): LABPT, INR in the last 72 hours. No results for input(s): LABURIN in the last 72 hours. Results for orders placed or performed during the hospital encounter of 10/30/17  Culture, blood (routine x 2)     Status: None (Preliminary result)   Collection Time: 10/30/17 10:38 PM  Result Value Ref Range Status   Specimen Description BLOOD LEFT FOREARM  Final   Special Requests   Final    BOTTLES DRAWN AEROBIC AND ANAEROBIC Blood Culture results may not be optimal due to an excessive volume of blood received in culture bottles   Culture   Final    NO GROWTH < 12 HOURS Performed at [facility], [address], [place], [place] [address]    Report Status PENDING  Incomplete  Culture, blood (routine x 2)     Status: None (Preliminary result)   Collection Time: 10/30/17 10:38 PM  Result Value Ref Range Status   Specimen Description BLOOD RIGHT HAND  Final   Special Requests   Final    BOTTLES DRAWN AEROBIC AND ANAEROBIC Blood Culture adequate  volume   Culture   Final    NO GROWTH < 12 HOURS Performed at [facility], [address] [place], [place] [address]    Report Status PENDING  Incomplete     Radiologic Imaging: Dg Chest 2 View  Result Date: 10/30/2017 CLINICAL DATA:  Cough, congestion, and fever starting last night. Fatigue. EXAM: CHEST - 2 VIEW COMPARISON:  08/14/2017 FINDINGS: Heart size and pulmonary vascularity are normal. Linear fibrosis or atelectasis in the lung bases is similar to previous study. No airspace disease or consolidation. No blunting of costophrenic angles. No pneumothorax. Mediastinal contours appear intact. Calcification of the aorta. Mild degenerative changes in the spine. IMPRESSION: Linear fibrosis or atelectasis in the lung bases. No evidence of active pulmonary disease. Electronically Signed   By: Lucienne Capers M.D.   On:  10/30/2017 22:13   Ct Renal Stone Study  Result Date: 10/31/2017 CLINICAL DATA:  Fever, malaise, fatigue, nasal congestion, sore throat, cough and emesis. Previous history of renal stones and pyelonephritis. EXAM: CT ABDOMEN AND PELVIS WITHOUT CONTRAST TECHNIQUE: Multidetector CT imaging of the abdomen and pelvis was performed following the standard protocol without IV contrast. COMPARISON:  06/18/2017 FINDINGS: Lower chest: Focal peripheral areas of consolidation or atelectasis in the lung bases. Moderate esophageal hiatal hernia. Hepatobiliary: Cyst in segment 5 of the liver, unchanged. No other focal liver lesions identified. Gallbladder and bile ducts are unremarkable. Pancreas: Focal prominence of the tail of the pancreas seen mainly on the axial images and unchanged since prior study. This probably represents pancreatic tortuosity rather than a true mass lesion. No pancreatic ductal dilatation or surrounding inflammatory changes. Spleen: Normal in size without focal abnormality. Adrenals/Urinary Tract: No adrenal gland nodules. Multiple bilateral renal cysts as seen previously. Multiple intrarenal stones bilaterally, measuring up to about 7 mm diameter. Mild left hydroureter with probable stone in the mid left ureter at the level of the sacrum. Distal left ureter is relatively decompressed. No hydronephrosis on the right. Bladder is unremarkable. Prominent gonadal vein calcifications are present. Stomach/Bowel: Stomach is within normal limits. Appendix appears normal. No evidence of bowel wall thickening, distention, or inflammatory changes. Vascular/Lymphatic: Aortic atherosclerosis. No enlarged abdominal or pelvic lymph nodes. Reproductive: Status post hysterectomy. No adnexal masses. Other: No abdominal wall hernia or abnormality. No abdominopelvic ascites. Musculoskeletal: Degenerative changes in the spine and hips. No destructive bone lesions. IMPRESSION: 1. Focal areas of consolidation or atelectasis  in the lung bases. 2. Moderate esophageal hiatal hernia. 3. Multiple bilateral nonobstructing intrarenal stones. Probable stone in the mid left ureter with mild left hydroureter, suggesting a partially obstructing stone. 4. Multiple bilateral renal cysts 5. Focal prominence of the tail of the pancreas probably represents pancreatic tortuosity rather than a true mass lesion. Aortic Atherosclerosis (ICD10-I70.0). Electronically Signed   By: Lucienne Capers M.D.   On: 10/31/2017 03:09   UA is nitrite positive, 21-50 red blood cells and white blood cells per high-powered field.  Impression/Assessment:  62 year old female presenting with partially obstructing left mid ureteral calculus with signs and symptoms of sepsis/SIRS.  I have counseled her to undergo left ureteral stent placement for maximal urinary decompression.  She is already on ceftriaxone and previous cultures have been sensitive to this.  She is quite familiar with ureteral stent placement.  Plan:  -NPO -continue IV abx -Risk and benefits of ureteral stent were discussed in detail, patient is quite familiar with this and we will plan for staged ureteroscopic procedure once her infection is cleared -all questions  answered  10/31/2017, 10:53 AM  Hollice Espy,  MD

## 2017-10-31 NOTE — ED Notes (Signed)
Pt informed will need urine sample. Pt verbalizes understanding.

## 2017-10-31 NOTE — Anesthesia Post-op Follow-up Note (Signed)
Anesthesia QCDR form completed.        

## 2017-11-01 ENCOUNTER — Encounter: Payer: Self-pay | Admitting: Urology

## 2017-11-01 LAB — BASIC METABOLIC PANEL
Anion gap: 6 (ref 5–15)
BUN: 13 mg/dL (ref 6–20)
CO2: 29 mmol/L (ref 22–32)
Calcium: 8.4 mg/dL — ABNORMAL LOW (ref 8.9–10.3)
Chloride: 106 mmol/L (ref 98–111)
Creatinine, Ser: 0.74 mg/dL (ref 0.44–1.00)
GFR calc Af Amer: >60 mL/min (ref >60)
GFR calc non Af Amer: >60 mL/min (ref >60)
Glucose, Bld: 109 mg/dL — ABNORMAL HIGH (ref 70–99)
Potassium: 3.6 mmol/L (ref 3.5–5.1)
Sodium: 141 mmol/L (ref 135–145)

## 2017-11-01 LAB — VITAMIN D 25 HYDROXY (VIT D DEFICIENCY, FRACTURES): Vit D, 25-Hydroxy: 23.2 ng/mL — ABNORMAL LOW (ref 30.0–100.0)

## 2017-11-01 LAB — CBC
HCT: 36.5 % (ref 35.0–47.0)
Hemoglobin: 12.4 g/dL (ref 12.0–16.0)
MCH: 30.6 pg (ref 26.0–34.0)
MCHC: 34.1 g/dL (ref 32.0–36.0)
MCV: 89.7 fL (ref 80.0–100.0)
Platelets: 190 10*3/uL (ref 150–440)
RBC: 4.07 MIL/uL (ref 3.80–5.20)
RDW: 15.3 % — ABNORMAL HIGH (ref 11.5–14.5)
WBC: 11.9 10*3/uL — ABNORMAL HIGH (ref 3.6–11.0)

## 2017-11-01 LAB — CALCIUM, IONIZED: Calcium, Ionized, Serum: 4.4 mg/dL — ABNORMAL LOW (ref 4.5–5.6)

## 2017-11-01 LAB — GLUCOSE, CAPILLARY: Glucose-Capillary: 99 mg/dL (ref 70–99)

## 2017-11-01 LAB — PARATHYROID HORMONE, INTACT (NO CA): PTH: 26 pg/mL (ref 15–65)

## 2017-11-01 MED ORDER — CIPROFLOXACIN HCL 500 MG PO TABS: 500.0000 mg | ORAL_TABLET | Freq: Two times a day (BID) | ORAL | 0 refills | Status: AC

## 2017-11-01 NOTE — Addendum Note (Signed)
Addendum  created 11/01/17 1059 by Doreen Salvage, CRNA   Charge Capture section accepted

## 2017-11-01 NOTE — Progress Notes (Signed)
Urology Consult Follow Up  Subjective: Postop day 1 status post placement of a left ureteral stent.  Overall feeling dramatically improved.  Defervesced since of white count.  Afebrile.  Tolerating stent.  Anti-infectives: Anti-infectives (From admission, onward)   Start     Dose/Rate Route Frequency Ordered Stop   11/01/17 0200  cefTRIAXone (ROCEPHIN) 2 g in sodium chloride 0.9 % 100 mL IVPB     2 g 200 mL/hr over 30 Minutes Intravenous Every 24 hours 10/31/17 0354     11/01/17 0000  ciprofloxacin (CIPRO) 500 MG tablet     500 mg Oral 2 times daily 11/01/17 1129 11/06/17 2359   10/31/17 0345  azithromycin (ZITHROMAX) 500 mg in sodium chloride 0.9 % 250 mL IVPB  Status:  Discontinued     500 mg 250 mL/hr over 60 Minutes Intravenous  Once 10/31/17 0335 11/01/17 1114   10/31/17 0200  cefTRIAXone (ROCEPHIN) 2 g in sodium chloride 0.9 % 100 mL IVPB     2 g 200 mL/hr over 30 Minutes Intravenous  Once 10/31/17 0146 10/31/17 0247      Current Facility-Administered Medications  Medication Dose Route Frequency Provider Last Rate Last Dose  . acetaminophen (TYLENOL) tablet 650 mg  650 mg Oral Q6H PRN Arta Silence, MD   650 mg at 10/31/17 2058   Or  . acetaminophen (TYLENOL) suppository 650 mg  650 mg Rectal Q6H PRN Arta Silence, MD      . amLODipine (NORVASC) tablet 5 mg  5 mg Oral Daily Arta Silence, MD   5 mg at 11/01/17 1010  . bisacodyl (DULCOLAX) EC tablet 5 mg  5 mg Oral Daily PRN Arta Silence, MD      . cefTRIAXone (ROCEPHIN) 2 g in sodium chloride 0.9 % 100 mL IVPB  2 g Intravenous Q24H Arta Silence, MD 200 mL/hr at 11/01/17 0520 2 g at 11/01/17 0520  . enoxaparin (LOVENOX) injection 40 mg  40 mg Subcutaneous Q24H Arta Silence, MD   40 mg at 11/01/17 0521  . lactated ringers infusion   Intravenous Continuous Arta Silence, MD   Stopped at 10/31/17 1022  . levothyroxine (SYNTHROID, LEVOTHROID) tablet 150 mcg  150 mcg Oral QHS Arta Silence, MD   150 mcg at 10/31/17 2057  . metoprolol succinate (TOPROL-XL) 24 hr tablet 25 mg  25 mg Oral q morning - 10a Arta Silence, MD   25 mg at 11/01/17 1010  . morphine 2 MG/ML injection 2-4 mg  2-4 mg Intravenous Q3H PRN Arta Silence, MD      . ondansetron (ZOFRAN) tablet 4 mg  4 mg Oral Q6H PRN Arta Silence, MD       Or  . ondansetron (ZOFRAN) injection 4 mg  4 mg Intravenous Q6H PRN Arta Silence, MD      . pantoprazole (PROTONIX) EC tablet 40 mg  40 mg Oral Daily Arta Silence, MD   40 mg at 11/01/17 1009  . senna-docusate (Senokot-S) tablet 1 tablet  1 tablet Oral QHS PRN Arta Silence, MD         Objective: Vital signs in last 24 hours: Temp:  [97.5 F (36.4 C)-100.8 F (38.2 C)] 98.1 F (36.7 C) (09/17 0450) Pulse Rate:  [67-78] 75 (09/17 1006) Resp:  [16-20] 20 (09/17 0450) BP: (107-132)/(54-83) 132/83 (09/17 1006) SpO2:  [91 %-98 %] 96 % (09/17 0450)  Intake/Output from previous day: 09/16 0701 - 09/17 0700 In: 977.9 [P.O.:150; I.V.:827.9] Out: 1250 [Urine:1250] Intake/Output this shift: Total I/O In: 120 [  P.O.:120] Out: -    Physical Exam  Constitutional: She is oriented to person, place, and time. She appears well-developed and well-nourished.  Pulmonary/Chest: Effort normal. No respiratory distress.  Abdominal: Soft.  Neurological: She is alert and oriented to person, place, and time.  Skin: Skin is warm and dry.  Psychiatric: She has a normal mood and affect. Her behavior is normal.  Vitals reviewed.   Lab Results:  Recent Labs    10/30/17 2150 11/01/17 0459  WBC 22.5* 11.9*  HGB 13.3 12.4  HCT 39.8 36.5  PLT 219 190   BMET Recent Labs    10/30/17 2150 11/01/17 0459  NA 134* 141  K 3.5 3.6  CL 102 106  CO2 23 29  GLUCOSE 146* 109*  BUN 12 13  CREATININE 0.74 0.74  CALCIUM 8.4* 8.4*   PT/INR No results for input(s): LABPROT, INR in the last 72 hours. ABG No results for input(s): PHART, HCO3  in the last 72 hours.  Invalid input(s): PCO2, PO2  Studies/Results: Dg Chest 2 View  Result Date: 10/30/2017 CLINICAL DATA:  Cough, congestion, and fever starting last night. Fatigue. EXAM: CHEST - 2 VIEW COMPARISON:  08/14/2017 FINDINGS: Heart size and pulmonary vascularity are normal. Linear fibrosis or atelectasis in the lung bases is similar to previous study. No airspace disease or consolidation. No blunting of costophrenic angles. No pneumothorax. Mediastinal contours appear intact. Calcification of the aorta. Mild degenerative changes in the spine. IMPRESSION: Linear fibrosis or atelectasis in the lung bases. No evidence of active pulmonary disease. Electronically Signed   By: Lucienne Capers M.D.   On: 10/30/2017 22:13   Ct Renal Stone Study  Result Date: 10/31/2017 CLINICAL DATA:  Fever, malaise, fatigue, nasal congestion, sore throat, cough and emesis. Previous history of renal stones and pyelonephritis. EXAM: CT ABDOMEN AND PELVIS WITHOUT CONTRAST TECHNIQUE: Multidetector CT imaging of the abdomen and pelvis was performed following the standard protocol without IV contrast. COMPARISON:  06/18/2017 FINDINGS: Lower chest: Focal peripheral areas of consolidation or atelectasis in the lung bases. Moderate esophageal hiatal hernia. Hepatobiliary: Cyst in segment 5 of the liver, unchanged. No other focal liver lesions identified. Gallbladder and bile ducts are unremarkable. Pancreas: Focal prominence of the tail of the pancreas seen mainly on the axial images and unchanged since prior study. This probably represents pancreatic tortuosity rather than a true mass lesion. No pancreatic ductal dilatation or surrounding inflammatory changes. Spleen: Normal in size without focal abnormality. Adrenals/Urinary Tract: No adrenal gland nodules. Multiple bilateral renal cysts as seen previously. Multiple intrarenal stones bilaterally, measuring up to about 7 mm diameter. Mild left hydroureter with probable  stone in the mid left ureter at the level of the sacrum. Distal left ureter is relatively decompressed. No hydronephrosis on the right. Bladder is unremarkable. Prominent gonadal vein calcifications are present. Stomach/Bowel: Stomach is within normal limits. Appendix appears normal. No evidence of bowel wall thickening, distention, or inflammatory changes. Vascular/Lymphatic: Aortic atherosclerosis. No enlarged abdominal or pelvic lymph nodes. Reproductive: Status post hysterectomy. No adnexal masses. Other: No abdominal wall hernia or abnormality. No abdominopelvic ascites. Musculoskeletal: Degenerative changes in the spine and hips. No destructive bone lesions. IMPRESSION: 1. Focal areas of consolidation or atelectasis in the lung bases. 2. Moderate esophageal hiatal hernia. 3. Multiple bilateral nonobstructing intrarenal stones. Probable stone in the mid left ureter with mild left hydroureter, suggesting a partially obstructing stone. 4. Multiple bilateral renal cysts 5. Focal prominence of the tail of the pancreas probably represents pancreatic tortuosity rather  than a true mass lesion. Aortic Atherosclerosis (ICD10-I70.0). Electronically Signed   By: Lucienne Capers M.D.   On: 10/31/2017 03:09     Assessment/ Plan: 62 year old female with recurrent episodes of pyelonephritis, bilateral kidney stones with a partially obstructing left ureteral stone in the setting of sepsis status post stent placement yesterday.  We went ahead and discuss definitive management of her left-sided stones today in the form of outpatient left ureteroscopy with laser lithotripsy and stent exchange.  She is undergone this procedure multiple times in the past and is familiar.  We discussed risk of the procedure at length today including risk of bleeding, infection, damage surrounding structures, intolerance of stent, need for staged procedure amongst others.  Questions were answered.  She will be booked for surgery in 2 to 3  weeks.  Continue oral antibiotics upon discharge with repeat Ua/ UCx prior to surgery.       LOS: 1 day    Hollice Espy 11/01/2017

## 2017-11-01 NOTE — Progress Notes (Signed)
Pt discharged per MD order. IV removed. Discharge instructions reviewed with pt. Pt verbalized understanding of instruction with all question answered to pt satisfaction. Pt taken to car in wheelchair by staff.

## 2017-11-01 NOTE — H&P (View-Only) (Signed)
Urology Consult Follow Up  Subjective: Postop day 1 status post placement of a left ureteral stent.  Overall feeling dramatically improved.  Defervesced since of white count.  Afebrile.  Tolerating stent.  Anti-infectives: Anti-infectives (From admission, onward)   Start     Dose/Rate Route Frequency Ordered Stop   11/01/17 0200  cefTRIAXone (ROCEPHIN) 2 g in sodium chloride 0.9 % 100 mL IVPB     2 g 200 mL/hr over 30 Minutes Intravenous Every 24 hours 10/31/17 0354     11/01/17 0000  ciprofloxacin (CIPRO) 500 MG tablet     500 mg Oral 2 times daily 11/01/17 1129 11/06/17 2359   10/31/17 0345  azithromycin (ZITHROMAX) 500 mg in sodium chloride 0.9 % 250 mL IVPB  Status:  Discontinued     500 mg 250 mL/hr over 60 Minutes Intravenous  Once 10/31/17 0335 11/01/17 1114   10/31/17 0200  cefTRIAXone (ROCEPHIN) 2 g in sodium chloride 0.9 % 100 mL IVPB     2 g 200 mL/hr over 30 Minutes Intravenous  Once 10/31/17 0146 10/31/17 0247      Current Facility-Administered Medications  Medication Dose Route Frequency Provider Last Rate Last Dose  . acetaminophen (TYLENOL) tablet 650 mg  650 mg Oral Q6H PRN Arta Silence, MD   650 mg at 10/31/17 2058   Or  . acetaminophen (TYLENOL) suppository 650 mg  650 mg Rectal Q6H PRN Arta Silence, MD      . amLODipine (NORVASC) tablet 5 mg  5 mg Oral Daily Arta Silence, MD   5 mg at 11/01/17 1010  . bisacodyl (DULCOLAX) EC tablet 5 mg  5 mg Oral Daily PRN Arta Silence, MD      . cefTRIAXone (ROCEPHIN) 2 g in sodium chloride 0.9 % 100 mL IVPB  2 g Intravenous Q24H Arta Silence, MD 200 mL/hr at 11/01/17 0520 2 g at 11/01/17 0520  . enoxaparin (LOVENOX) injection 40 mg  40 mg Subcutaneous Q24H Arta Silence, MD   40 mg at 11/01/17 0521  . lactated ringers infusion   Intravenous Continuous Arta Silence, MD   Stopped at 10/31/17 1022  . levothyroxine (SYNTHROID, LEVOTHROID) tablet 150 mcg  150 mcg Oral QHS Arta Silence, MD   150 mcg at 10/31/17 2057  . metoprolol succinate (TOPROL-XL) 24 hr tablet 25 mg  25 mg Oral q morning - 10a Arta Silence, MD   25 mg at 11/01/17 1010  . morphine 2 MG/ML injection 2-4 mg  2-4 mg Intravenous Q3H PRN Arta Silence, MD      . ondansetron (ZOFRAN) tablet 4 mg  4 mg Oral Q6H PRN Arta Silence, MD       Or  . ondansetron (ZOFRAN) injection 4 mg  4 mg Intravenous Q6H PRN Arta Silence, MD      . pantoprazole (PROTONIX) EC tablet 40 mg  40 mg Oral Daily Arta Silence, MD   40 mg at 11/01/17 1009  . senna-docusate (Senokot-S) tablet 1 tablet  1 tablet Oral QHS PRN Arta Silence, MD         Objective: Vital signs in last 24 hours: Temp:  [97.5 F (36.4 C)-100.8 F (38.2 C)] 98.1 F (36.7 C) (09/17 0450) Pulse Rate:  [67-78] 75 (09/17 1006) Resp:  [16-20] 20 (09/17 0450) BP: (107-132)/(54-83) 132/83 (09/17 1006) SpO2:  [91 %-98 %] 96 % (09/17 0450)  Intake/Output from previous day: 09/16 0701 - 09/17 0700 In: 977.9 [P.O.:150; I.V.:827.9] Out: 1250 [Urine:1250] Intake/Output this shift: Total I/O In: 120 [  P.O.:120] Out: -    Physical Exam  Constitutional: She is oriented to person, place, and time. She appears well-developed and well-nourished.  Pulmonary/Chest: Effort normal. No respiratory distress.  Abdominal: Soft.  Neurological: She is alert and oriented to person, place, and time.  Skin: Skin is warm and dry.  Psychiatric: She has a normal mood and affect. Her behavior is normal.  Vitals reviewed.   Lab Results:  Recent Labs    10/30/17 2150 11/01/17 0459  WBC 22.5* 11.9*  HGB 13.3 12.4  HCT 39.8 36.5  PLT 219 190   BMET Recent Labs    10/30/17 2150 11/01/17 0459  NA 134* 141  K 3.5 3.6  CL 102 106  CO2 23 29  GLUCOSE 146* 109*  BUN 12 13  CREATININE 0.74 0.74  CALCIUM 8.4* 8.4*   PT/INR No results for input(s): LABPROT, INR in the last 72 hours. ABG No results for input(s): PHART, HCO3  in the last 72 hours.  Invalid input(s): PCO2, PO2  Studies/Results: Dg Chest 2 View  Result Date: 10/30/2017 CLINICAL DATA:  Cough, congestion, and fever starting last night. Fatigue. EXAM: CHEST - 2 VIEW COMPARISON:  08/14/2017 FINDINGS: Heart size and pulmonary vascularity are normal. Linear fibrosis or atelectasis in the lung bases is similar to previous study. No airspace disease or consolidation. No blunting of costophrenic angles. No pneumothorax. Mediastinal contours appear intact. Calcification of the aorta. Mild degenerative changes in the spine. IMPRESSION: Linear fibrosis or atelectasis in the lung bases. No evidence of active pulmonary disease. Electronically Signed   By: Lucienne Capers M.D.   On: 10/30/2017 22:13   Ct Renal Stone Study  Result Date: 10/31/2017 CLINICAL DATA:  Fever, malaise, fatigue, nasal congestion, sore throat, cough and emesis. Previous history of renal stones and pyelonephritis. EXAM: CT ABDOMEN AND PELVIS WITHOUT CONTRAST TECHNIQUE: Multidetector CT imaging of the abdomen and pelvis was performed following the standard protocol without IV contrast. COMPARISON:  06/18/2017 FINDINGS: Lower chest: Focal peripheral areas of consolidation or atelectasis in the lung bases. Moderate esophageal hiatal hernia. Hepatobiliary: Cyst in segment 5 of the liver, unchanged. No other focal liver lesions identified. Gallbladder and bile ducts are unremarkable. Pancreas: Focal prominence of the tail of the pancreas seen mainly on the axial images and unchanged since prior study. This probably represents pancreatic tortuosity rather than a true mass lesion. No pancreatic ductal dilatation or surrounding inflammatory changes. Spleen: Normal in size without focal abnormality. Adrenals/Urinary Tract: No adrenal gland nodules. Multiple bilateral renal cysts as seen previously. Multiple intrarenal stones bilaterally, measuring up to about 7 mm diameter. Mild left hydroureter with probable  stone in the mid left ureter at the level of the sacrum. Distal left ureter is relatively decompressed. No hydronephrosis on the right. Bladder is unremarkable. Prominent gonadal vein calcifications are present. Stomach/Bowel: Stomach is within normal limits. Appendix appears normal. No evidence of bowel wall thickening, distention, or inflammatory changes. Vascular/Lymphatic: Aortic atherosclerosis. No enlarged abdominal or pelvic lymph nodes. Reproductive: Status post hysterectomy. No adnexal masses. Other: No abdominal wall hernia or abnormality. No abdominopelvic ascites. Musculoskeletal: Degenerative changes in the spine and hips. No destructive bone lesions. IMPRESSION: 1. Focal areas of consolidation or atelectasis in the lung bases. 2. Moderate esophageal hiatal hernia. 3. Multiple bilateral nonobstructing intrarenal stones. Probable stone in the mid left ureter with mild left hydroureter, suggesting a partially obstructing stone. 4. Multiple bilateral renal cysts 5. Focal prominence of the tail of the pancreas probably represents pancreatic tortuosity rather  than a true mass lesion. Aortic Atherosclerosis (ICD10-I70.0). Electronically Signed   By: Lucienne Capers M.D.   On: 10/31/2017 03:09     Assessment/ Plan: 62 year old female with recurrent episodes of pyelonephritis, bilateral kidney stones with a partially obstructing left ureteral stone in the setting of sepsis status post stent placement yesterday.  We went ahead and discuss definitive management of her left-sided stones today in the form of outpatient left ureteroscopy with laser lithotripsy and stent exchange.  She is undergone this procedure multiple times in the past and is familiar.  We discussed risk of the procedure at length today including risk of bleeding, infection, damage surrounding structures, intolerance of stent, need for staged procedure amongst others.  Questions were answered.  She will be booked for surgery in 2 to 3  weeks.  Continue oral antibiotics upon discharge with repeat Ua/ UCx prior to surgery.       LOS: 1 day    Hollice Espy 11/01/2017

## 2017-11-01 NOTE — Discharge Summary (Signed)
Felicia Manning, is a 62 y.o. female  DOB 1955-05-21  MRN [id number].  Admission date:  10/30/2017  Admitting Physician  Arta Silence, MD  Discharge Date:  11/01/2017   Primary MD  Glendon Axe, MD  Recommendations for primary care physician for things to follow:  Follow-up with PCP in 1 week, patient already has appointment with Dr. Erlene Quan as an outpatient to have staged procedure for ureteral stones.   Admission Diagnosis  Kidney stone [N20.0] Pyelonephritis [N12] Fever, unspecified fever cause [R50.9]   Discharge Diagnosis  Kidney stone [N20.0] Pyelonephritis [N12] Fever, unspecified fever cause [R50.9]    Active Problems:   Pyelonephritis      Past Medical History:  Diagnosis Date  . Barrett's esophagus   . Dysrhythmia    stress related at work.  Marland Kitchen GERD (gastroesophageal reflux disease)   . Hepatitis C 2008  . Hyperlipidemia   . Hypertension   . Hypothyroidism   . Nephrolithiasis   . Osteoporosis   . Thyroid cancer ([place]) 2012  . Thyroid cancer [facility])     Past Surgical History:  Procedure Laterality Date  . ABDOMINAL HYSTERECTOMY  2006  . COLONOSCOPY WITH PROPOFOL N/A 04/25/2015   Procedure: COLONOSCOPY WITH PROPOFOL;  Surgeon: Josefine Class, MD;  Location: [facility] ENDOSCOPY;  Service: Endoscopy;  Laterality: N/A;  . CYSTOSCOPY W/ RETROGRADES Bilateral 05/19/2015   Procedure: CYSTOSCOPY WITH RETROGRADE PYELOGRAM;  Surgeon: Hollice Espy, MD;  Location: [facility] ORS;  Service: Urology;  Laterality: Bilateral;  . CYSTOSCOPY W/ URETERAL STENT PLACEMENT Right 05/26/2015   Procedure: CYSTOSCOPY WITH STENT REPLACEMENT;  Surgeon: Hollice Espy, MD;  Location: [facility] ORS;  Service: Urology;  Laterality: Right;  . CYSTOSCOPY WITH STENT PLACEMENT Right 05/19/2015   Procedure: CYSTOSCOPY WITH STENT PLACEMENT;   Surgeon: Hollice Espy, MD;  Location: [facility] ORS;  Service: Urology;  Laterality: Right;  . CYSTOSCOPY WITH STENT PLACEMENT Right 05/26/2015   Procedure: CYSTOSCOPY WITH STENT PLACEMENT;  Surgeon: Hollice Espy, MD;  Location: [facility] ORS;  Service: Urology;  Laterality: Right;  . CYSTOSCOPY WITH STENT PLACEMENT Right 06/25/2016   Procedure: CYSTOSCOPY WITH STENT PLACEMENT;  Surgeon: Nickie Retort, MD;  Location: [facility] ORS;  Service: Urology;  Laterality: Right;  . CYSTOSCOPY WITH STENT PLACEMENT Left 10/31/2017   Procedure: CYSTOSCOPY WITH STENT PLACEMENT;  Surgeon: Hollice Espy, MD;  Location: [facility] ORS;  Service: Urology;  Laterality: Left;  . CYSTOSCOPY/URETEROSCOPY/HOLMIUM LASER/STENT PLACEMENT Left 07/23/2015   Procedure: CYSTOSCOPY/URETEROSCOPY/HOLMIUM LASER/STENT PLACEMENT/RETROGRADE PYELOGRAM;  Surgeon: Hollice Espy, MD;  Location: [facility] ORS;  Service: Urology;  Laterality: Left;  . ESOPHAGOGASTRODUODENOSCOPY (EGD) WITH PROPOFOL N/A 04/25/2015   Procedure: ESOPHAGOGASTRODUODENOSCOPY (EGD) WITH PROPOFOL;  Surgeon: Josefine Class, MD;  Location: [facility] ENDOSCOPY;  Service: Endoscopy;  Laterality: N/A;  . EYE SURGERY Bilateral 1964   lazy eye repair  . EYE SURGERY Bilateral 2001   lazy eye repair  . LITHOTRIPSY  2009  . THYROIDECTOMY  2010  . URETEROSCOPY WITH HOLMIUM LASER LITHOTRIPSY Right 05/19/2015   Procedure: URETEROSCOPY WITH HOLMIUM LASER LITHOTRIPSY;  Surgeon: Hollice Espy, MD;  Location: [facility] ORS;  Service: Urology;  Laterality: Right;  . URETEROSCOPY WITH HOLMIUM LASER LITHOTRIPSY Right 05/26/2015   Procedure: URETEROSCOPY WITH HOLMIUM LASER LITHOTRIPSY;  Surgeon: Hollice Espy, MD;  Location: [facility] ORS;  Service: Urology;  Laterality: Right;  . URETEROSCOPY WITH HOLMIUM LASER LITHOTRIPSY Right 06/25/2016   Procedure: URETEROSCOPY WITH HOLMIUM LASER LITHOTRIPSY;  Surgeon: Nickie Retort, MD;  Location: [facility] ORS;  Service: Urology;  Laterality: Right;  History  of present illness and  Hospital Course:     Kindly see H&P for history of present illness and admission details, please review complete Labs, Consult reports and Test reports for all details in brief  HPI  from the history and physical done on the day of admission 62 year old female patient who was admitted 3 times in the last 6 months comes in because of her, nausea.  Admitted for pyelonephritis.  Has history of nephrolithiasis, follows up with Dr. Erlene Quan.  Patient CT scan on admission showed multiple bilateral nonobstructing intrarenal stones, stone in the left ureter with the left hydroureter suggesting partially obstructing stone.   Hospital Course  Left flank pain, fever with sepsis present on admission secondary to 3 mm stone in the left iliac with mild hydroureteronephrosis.  Status post left ureteral stent by Dr. Erlene Quan, after the procedure the patient nausea, fever resolved, her left flank pain also improved.  Patient feels much better today and wants to go home.  I spoke with Dr. Erlene Quan, she needs outpatient surgery for left ureteral stone, will go home with oral antibiotics Cipro 500 mg twice daily for 5 days.  Urine cultures are showing more than 100,000 colonies of gram-negative rods, patient does not want to wait and wants to go home.  Sensitivities are not back yet.  However her white count is down from 20-11.6 today.  Afebrile.  Patient has been admitted before for sepsis, E. coli UTI.  2 hypothyroidism: Continue Synthyroid 3.  Essential hypertension: Controlled, continue metoprolol, Norvasc.  Discharge Condition: Stable   Follow UP  Follow-up Information    Glendon Axe, MD. Schedule an appointment as soon as possible for a visit in 1 week(s).   Specialty:  Internal Medicine Contact information: [place] [address] [phone]             Discharge Instructions  and  Discharge Medications      Allergies as of  11/01/2017   No Known Allergies     Medication List    TAKE these medications   amLODipine 5 MG tablet Commonly known as:  NORVASC Take 5 mg by mouth daily.   cholecalciferol 1000 units tablet Commonly known as:  VITAMIN D Take 2,000 Units by mouth daily.   ciprofloxacin 500 MG tablet Commonly known as:  CIPRO Take 1 tablet (500 mg total) by mouth 2 (two) times daily for 5 days.   levothyroxine 150 MCG tablet Commonly known as:  SYNTHROID, LEVOTHROID Take 150 mcg by mouth at bedtime.   metoprolol succinate 25 MG 24 hr tablet Commonly known as:  TOPROL-XL Take 25 mg by mouth every morning.   omeprazole 20 MG capsule Commonly known as:  PRILOSEC Take 20 mg by mouth every morning.         Diet and Activity recommendation: See Discharge Instructions above   Consults obtained -urology   Major procedures and Radiology Reports - PLEASE review detailed and final reports for all details, in brief -      Dg Chest 2 View  Result Date: 10/30/2017 CLINICAL DATA:  Cough, congestion, and fever starting last night. Fatigue. EXAM: CHEST - 2 VIEW COMPARISON:  08/14/2017 FINDINGS: Heart size and pulmonary vascularity are normal. Linear fibrosis or atelectasis in the lung bases is similar to previous study. No airspace disease or consolidation. No blunting of costophrenic angles. No pneumothorax. Mediastinal contours appear intact. Calcification of the aorta. Mild degenerative changes in the spine. IMPRESSION: Linear fibrosis  or atelectasis in the lung bases. No evidence of active pulmonary disease. Electronically Signed   By: Lucienne Capers M.D.   On: 10/30/2017 22:13   Ct Renal Stone Study  Result Date: 10/31/2017 CLINICAL DATA:  Fever, malaise, fatigue, nasal congestion, sore throat, cough and emesis. Previous history of renal stones and pyelonephritis. EXAM: CT ABDOMEN AND PELVIS WITHOUT CONTRAST TECHNIQUE: Multidetector CT imaging of the abdomen and pelvis was performed  following the standard protocol without IV contrast. COMPARISON:  06/18/2017 FINDINGS: Lower chest: Focal peripheral areas of consolidation or atelectasis in the lung bases. Moderate esophageal hiatal hernia. Hepatobiliary: Cyst in segment 5 of the liver, unchanged. No other focal liver lesions identified. Gallbladder and bile ducts are unremarkable. Pancreas: Focal prominence of the tail of the pancreas seen mainly on the axial images and unchanged since prior study. This probably represents pancreatic tortuosity rather than a true mass lesion. No pancreatic ductal dilatation or surrounding inflammatory changes. Spleen: Normal in size without focal abnormality. Adrenals/Urinary Tract: No adrenal gland nodules. Multiple bilateral renal cysts as seen previously. Multiple intrarenal stones bilaterally, measuring up to about 7 mm diameter. Mild left hydroureter with probable stone in the mid left ureter at the level of the sacrum. Distal left ureter is relatively decompressed. No hydronephrosis on the right. Bladder is unremarkable. Prominent gonadal vein calcifications are present. Stomach/Bowel: Stomach is within normal limits. Appendix appears normal. No evidence of bowel wall thickening, distention, or inflammatory changes. Vascular/Lymphatic: Aortic atherosclerosis. No enlarged abdominal or pelvic lymph nodes. Reproductive: Status post hysterectomy. No adnexal masses. Other: No abdominal wall hernia or abnormality. No abdominopelvic ascites. Musculoskeletal: Degenerative changes in the spine and hips. No destructive bone lesions. IMPRESSION: 1. Focal areas of consolidation or atelectasis in the lung bases. 2. Moderate esophageal hiatal hernia. 3. Multiple bilateral nonobstructing intrarenal stones. Probable stone in the mid left ureter with mild left hydroureter, suggesting a partially obstructing stone. 4. Multiple bilateral renal cysts 5. Focal prominence of the tail of the pancreas probably represents  pancreatic tortuosity rather than a true mass lesion. Aortic Atherosclerosis (ICD10-I70.0). Electronically Signed   By: Lucienne Capers M.D.   On: 10/31/2017 03:09    Micro Results     Recent Results (from the past 240 hour(s))  Culture, blood (routine x 2)     Status: None (Preliminary result)   Collection Time: 10/30/17 10:38 PM  Result Value Ref Range Status   Specimen Description BLOOD LEFT FOREARM  Final   Special Requests   Final    BOTTLES DRAWN AEROBIC AND ANAEROBIC Blood Culture results may not be optimal due to an excessive volume of blood received in culture bottles   Culture   Final    NO GROWTH 2 DAYS Performed at [facility], [address] [place], [place] [address]    Report Status PENDING  Incomplete  Culture, blood (routine x 2)     Status: None (Preliminary result)   Collection Time: 10/30/17 10:38 PM  Result Value Ref Range Status   Specimen Description BLOOD RIGHT HAND  Final   Special Requests   Final    BOTTLES DRAWN AEROBIC AND ANAEROBIC Blood Culture adequate volume   Culture   Final    NO GROWTH 2 DAYS Performed at [facility], [address] [place], [place] [address]    Report Status PENDING  Incomplete  Urine culture     Status: Abnormal (Preliminary result)   Collection Time: 10/31/17  1:18 AM  Result Value Ref Range  Status   Specimen Description   Final    URINE, RANDOM Performed at [facility], [address], [place], [place] [address]    Special Requests   Final    NONE Performed at [facility], [address] [place], [place] [address]    Culture >=100,000 COLONIES/mL GRAM NEGATIVE RODS (A)  Final   Report Status PENDING  Incomplete       Today   Subjective:   Neoma Laming Morken today has no headache,no chest abdominal pain,no new weakness tingling or numbness, feels much better wants to go home today.  Objective:   Blood pressure 132/83, pulse 75, temperature 98.1 F (36.7 C),  temperature source Oral, resp. rate 20, height 5\' 7"  (1.702 m), weight 81.6 kg, SpO2 96 %.   Intake/Output Summary (Last 24 hours) at 11/01/2017 1131 Last data filed at 11/01/2017 1013 Gross per 24 hour  Intake 370 ml  Output 700 ml  Net -330 ml    Exam Awake Alert, Oriented x 3, No new F.N deficits, Normal affect [place].AT,PERRAL Supple Neck,No JVD, No cervical lymphadenopathy appriciated.  Symmetrical Chest wall movement, Good air movement bilaterally, CTAB RRR,No Gallops,Rubs or new Murmurs, No Parasternal Heave +ve B.Sounds, Abd Soft, Non tender, No organomegaly appriciated, No rebound -guarding or rigidity. No Cyanosis, Clubbing or edema, No new Rash or bruise  Data Review   CBC w Diff:  Lab Results  Component Value Date   WBC 11.9 (H) 11/01/2017   HGB 12.4 11/01/2017   HCT 36.5 11/01/2017   PLT 190 11/01/2017   LYMPHOPCT 6 10/30/2017   MONOPCT 6 10/30/2017   EOSPCT 0 10/30/2017   BASOPCT 0 10/30/2017    CMP:  Lab Results  Component Value Date   NA 141 11/01/2017   K 3.6 11/01/2017   CL 106 11/01/2017   CO2 29 11/01/2017   BUN 13 11/01/2017   CREATININE 0.74 11/01/2017   PROT 7.1 10/30/2017   ALBUMIN 3.9 10/30/2017   BILITOT 0.9 10/30/2017   ALKPHOS 77 10/30/2017   AST 17 10/30/2017   ALT 16 10/30/2017  .   Total Time in preparing paper work, data evaluation and todays exam - 35 minutes  Epifanio Lesches M.D on 11/01/2017 at 11:31 AM    Note: This dictation was prepared with Dragon dictation along with smaller phrase technology. Any transcriptional errors that result from this process are unintentional.

## 2017-11-02 ENCOUNTER — Other Ambulatory Visit: Payer: Self-pay | Admitting: Radiology

## 2017-11-02 DIAGNOSIS — Z87448 Personal history of other diseases of urinary system: Secondary | ICD-10-CM

## 2017-11-02 DIAGNOSIS — N201 Calculus of ureter: Secondary | ICD-10-CM

## 2017-11-02 LAB — URINE CULTURE: Culture: >=100000 — AB

## 2017-11-04 LAB — CULTURE, BLOOD (ROUTINE X 2)
Culture: NO GROWTH
Culture: NO GROWTH
Special Requests: ADEQUATE

## 2017-11-07 ENCOUNTER — Telehealth: Payer: Self-pay | Admitting: Urology

## 2017-11-07 NOTE — Telephone Encounter (Signed)
Insurance Authorization for Outpatient Surgery DOS 11/16/2017 Dr. Hollice Espy CPT [phone]  Per [employer] [phone]) guidelines, CPT [phone] does NOT require prior authorization. Reference # [id number]  [facility] 11/07/2017

## 2017-11-08 ENCOUNTER — Other Ambulatory Visit: Payer: 59

## 2017-11-08 DIAGNOSIS — N201 Calculus of ureter: Secondary | ICD-10-CM

## 2017-11-08 DIAGNOSIS — Z87448 Personal history of other diseases of urinary system: Secondary | ICD-10-CM

## 2017-11-08 LAB — MICROSCOPIC EXAMINATION
Bacteria, UA: NONE SEEN
RBC, UA: >30 /hpf — ABNORMAL HIGH (ref 0–2)

## 2017-11-08 LAB — URINALYSIS, COMPLETE
Bilirubin, UA: NEGATIVE
Glucose, UA: NEGATIVE
Ketones, UA: NEGATIVE
Nitrite, UA: NEGATIVE
Specific Gravity, UA: 1.015 (ref 1.005–1.030)
Urobilinogen, Ur: 0.2 mg/dL (ref 0.2–1.0)
pH, UA: 7 (ref 5.0–7.5)

## 2017-11-09 ENCOUNTER — Other Ambulatory Visit: Payer: Self-pay

## 2017-11-09 ENCOUNTER — Encounter
Admission: RE | Admit: 2017-11-09 | Discharge: 2017-11-09 | Disposition: A | Discharge status: Home / Self Care | Payer: 59 | Source: Ambulatory Visit | Attending: Urology | Admitting: Urology

## 2017-11-09 DIAGNOSIS — Z01818 Encounter for other preprocedural examination: Secondary | ICD-10-CM | POA: Insufficient documentation

## 2017-11-09 NOTE — Patient Instructions (Signed)
Your procedure is scheduled on: 11-16-17  Report to Same Day Surgery 2nd floor medical mall [facility] Entrance-take elevator on left to 2nd floor.  Check in with surgery information desk.) To find out your arrival time please call [phone] between 1PM - 3PM on 11-15-17  Remember: Instructions that are not followed completely may result in serious medical risk, up to and including death, or upon the discretion of your surgeon and anesthesiologist your surgery may need to be rescheduled.    _x___ 1. Do not eat food after midnight the night before your procedure. You may drink clear liquids up to 2 hours before you are scheduled to arrive at the hospital for your procedure.  Do not drink clear liquids within 2 hours of your scheduled arrival to the hospital.  Clear liquids include  --Water or Apple juice without pulp  --Clear carbohydrate beverage such as ClearFast or Gatorade  --Black Coffee or Clear Tea (No milk, no creamers, do not add anything to the coffee or Tea   ____Ensure clear carbohydrate drink on the way to the hospital for bariatric patients  ____Ensure clear carbohydrate drink 3 hours before surgery for Dr Dwyane Luo patients if physician instructed.   No gum chewing or hard candies.     __x__ 2. No Alcohol for 24 hours before or after surgery.   __x__3. No Smoking or e-cigarettes for 24 prior to surgery.  Do not use any chewable tobacco products for at least 6 hour prior to surgery   ____  4. Bring all medications with you on the day of surgery if instructed.    __x__ 5. Notify your doctor if there is any change in your medical condition     (cold, fever, infections).    x___6. On the morning of surgery brush your teeth with toothpaste and water.  You may rinse your mouth with mouth wash if you wish.  Do not swallow any toothpaste or mouthwash.   Do not wear jewelry, make-up, hairpins, clips or nail polish.  Do not wear lotions, powders, or perfumes. You may wear  deodorant.  Do not shave 48 hours prior to surgery. Men may shave face and neck.  Do not bring valuables to the hospital.    [facility] is not responsible for any belongings or valuables.               Contacts, dentures or bridgework may not be worn into surgery.  Leave your suitcase in the car. After surgery it may be brought to your room.  For patients admitted to the hospital, discharge time is determined by your treatment team.  _  Patients discharged the day of surgery will not be allowed to drive home.  You will need someone to drive you home and stay with you the night of your procedure.    Please read over the following fact sheets that you were given:   [facility] Preparing for Surgery  _x___ TAKE THE FOLLOWING MEDICATION THE MORNING OF SURGERY WITH A SMALL SIP OF WATER. These include:  1. AMLODIPINE  2. METOPRPOLOL  3. OMEPARAZOLE  4. TAKE AN EXTRA OMEPRAZOLE THE NIGHT BEFORE YOUR SURGERY  5.  6.  ____Fleets enema or Magnesium Citrate as directed.   ____ Use CHG Soap or sage wipes as directed on instruction sheet   ____ Use inhalers on the day of surgery and bring to hospital day of surgery  ____ Stop Metformin and Janumet 2 days prior to surgery.  ____ Take 1/2 of usual insulin dose the night before surgery and none on the morning surgery.   ____ Follow recommendations from Cardiologist, Pulmonologist or PCP regarding stopping Aspirin, Coumadin, Plavix ,Eliquis, Effient, or Pradaxa, and Pletal.  X____Stop Anti-inflammatories such as Advil, Aleve, Ibuprofen, Motrin, Naproxen, Naprosyn, Goodies powders or aspirin products NOW-OK to take Tylenol    _x___ Stop supplements until after surgery-STOP AZO-CRANBERRY NOW   ____ Bring C-Pap to the hospital.

## 2017-11-11 LAB — CULTURE, URINE COMPREHENSIVE

## 2017-11-15 DIAGNOSIS — Z79890 Hormone replacement therapy (postmenopausal): Secondary | ICD-10-CM | POA: Diagnosis not present

## 2017-11-15 DIAGNOSIS — F17200 Nicotine dependence, unspecified, uncomplicated: Secondary | ICD-10-CM | POA: Diagnosis not present

## 2017-11-15 DIAGNOSIS — M810 Age-related osteoporosis without current pathological fracture: Secondary | ICD-10-CM | POA: Diagnosis not present

## 2017-11-15 DIAGNOSIS — Z79899 Other long term (current) drug therapy: Secondary | ICD-10-CM | POA: Diagnosis not present

## 2017-11-15 DIAGNOSIS — I10 Essential (primary) hypertension: Secondary | ICD-10-CM | POA: Diagnosis not present

## 2017-11-15 DIAGNOSIS — N202 Calculus of kidney with calculus of ureter: Secondary | ICD-10-CM | POA: Diagnosis present

## 2017-11-15 DIAGNOSIS — E039 Hypothyroidism, unspecified: Secondary | ICD-10-CM | POA: Diagnosis not present

## 2017-11-15 DIAGNOSIS — K219 Gastro-esophageal reflux disease without esophagitis: Secondary | ICD-10-CM | POA: Diagnosis not present

## 2017-11-15 MED ORDER — SODIUM CHLORIDE 0.9 % IV SOLN
1.0000 g | INTRAVENOUS | Status: AC
  Administered 2017-11-16: 1 g via INTRAVENOUS
  Filled 2017-11-15: qty 1

## 2017-11-15 NOTE — Progress Notes (Signed)
Pharmacy consulted to dose Gentamicin for prophylaxis for cystoscopy/ureteroscopy for 11/16/2017. Ordered 5 mg/kg dose based on recent adjusted weight of 68.98kg from [facility]. Prudy Feeler, RPh 11/15/2017 11:44 AM

## 2017-11-16 ENCOUNTER — Encounter
Admission: RE | Disposition: A | Discharge status: Home / Self Care | Payer: Self-pay | Source: Ambulatory Visit | Attending: Urology

## 2017-11-16 ENCOUNTER — Ambulatory Visit
Admission: RE | Admit: 2017-11-16 | Discharge: 2017-11-16 | Disposition: A | Discharge status: Home / Self Care | Payer: 59 | Source: Ambulatory Visit | Attending: Urology | Admitting: Urology

## 2017-11-16 ENCOUNTER — Ambulatory Visit: Payer: 59 | Admitting: Registered Nurse

## 2017-11-16 DIAGNOSIS — M810 Age-related osteoporosis without current pathological fracture: Secondary | ICD-10-CM | POA: Insufficient documentation

## 2017-11-16 DIAGNOSIS — K219 Gastro-esophageal reflux disease without esophagitis: Secondary | ICD-10-CM | POA: Insufficient documentation

## 2017-11-16 DIAGNOSIS — F17200 Nicotine dependence, unspecified, uncomplicated: Secondary | ICD-10-CM | POA: Insufficient documentation

## 2017-11-16 DIAGNOSIS — N202 Calculus of kidney with calculus of ureter: Secondary | ICD-10-CM | POA: Diagnosis not present

## 2017-11-16 DIAGNOSIS — Z79890 Hormone replacement therapy (postmenopausal): Secondary | ICD-10-CM | POA: Insufficient documentation

## 2017-11-16 DIAGNOSIS — Z79899 Other long term (current) drug therapy: Secondary | ICD-10-CM | POA: Insufficient documentation

## 2017-11-16 DIAGNOSIS — N201 Calculus of ureter: Secondary | ICD-10-CM

## 2017-11-16 DIAGNOSIS — I10 Essential (primary) hypertension: Secondary | ICD-10-CM | POA: Insufficient documentation

## 2017-11-16 DIAGNOSIS — E039 Hypothyroidism, unspecified: Secondary | ICD-10-CM | POA: Insufficient documentation

## 2017-11-16 HISTORY — PX: CYSTOSCOPY/URETEROSCOPY/HOLMIUM LASER/STENT PLACEMENT: SHX6546

## 2017-11-16 SURGERY — CYSTOSCOPY/URETEROSCOPY/HOLMIUM LASER/STENT PLACEMENT
Anesthesia: General | Site: Ureter | Laterality: Left

## 2017-11-16 MED ORDER — LIDOCAINE HCL (PF) 2 % IJ SOLN
INTRAMUSCULAR | Status: AC
  Filled 2017-11-16: qty 10

## 2017-11-16 MED ORDER — ROCURONIUM BROMIDE 50 MG/5ML IV SOLN
INTRAVENOUS | Status: AC
  Filled 2017-11-16: qty 1

## 2017-11-16 MED ORDER — IOTHALAMATE MEGLUMINE 43 % IV SOLN
INTRAVENOUS | Status: DC | PRN
  Administered 2017-11-16: 15 mL via URETHRAL

## 2017-11-16 MED ORDER — ROCURONIUM BROMIDE 100 MG/10ML IV SOLN
INTRAVENOUS | Status: DC | PRN
  Administered 2017-11-16: 50 mg via INTRAVENOUS

## 2017-11-16 MED ORDER — GENTAMICIN SULFATE 40 MG/ML IJ SOLN
5.0000 mg/kg | Freq: Once | INTRAVENOUS | Status: AC
  Administered 2017-11-16: 420 mg via INTRAVENOUS
  Filled 2017-11-16: qty 10.5

## 2017-11-16 MED ORDER — DEXAMETHASONE SODIUM PHOSPHATE 10 MG/ML IJ SOLN
INTRAMUSCULAR | Status: DC | PRN
  Administered 2017-11-16: 10 mg via INTRAVENOUS

## 2017-11-16 MED ORDER — PROMETHAZINE HCL 25 MG/ML IJ SOLN: 6.2500 mg | INTRAMUSCULAR | Status: DC | PRN

## 2017-11-16 MED ORDER — FENTANYL CITRATE (PF) 100 MCG/2ML IJ SOLN: 25.0000 ug | INTRAMUSCULAR | Status: DC | PRN

## 2017-11-16 MED ORDER — FENTANYL CITRATE (PF) 100 MCG/2ML IJ SOLN
INTRAMUSCULAR | Status: AC
  Filled 2017-11-16: qty 2

## 2017-11-16 MED ORDER — LIDOCAINE HCL (CARDIAC) PF 100 MG/5ML IV SOSY
PREFILLED_SYRINGE | INTRAVENOUS | Status: DC | PRN
  Administered 2017-11-16: 100 mg via INTRAVENOUS

## 2017-11-16 MED ORDER — TAMSULOSIN HCL 0.4 MG PO CAPS: 0.4000 mg | ORAL_CAPSULE | Freq: Every day | ORAL | 0 refills | Status: DC

## 2017-11-16 MED ORDER — PROPOFOL 10 MG/ML IV BOLUS
INTRAVENOUS | Status: DC | PRN
  Administered 2017-11-16: 150 mg via INTRAVENOUS

## 2017-11-16 MED ORDER — DOCUSATE SODIUM 100 MG PO CAPS: 100.0000 mg | ORAL_CAPSULE | Freq: Two times a day (BID) | ORAL | 0 refills | Status: DC | PRN

## 2017-11-16 MED ORDER — SODIUM CHLORIDE 0.9 % IV SOLN
INTRAVENOUS | Status: DC
  Administered 2017-11-16: 12:00:00 via INTRAVENOUS

## 2017-11-16 MED ORDER — MIDAZOLAM HCL 2 MG/2ML IJ SOLN
INTRAMUSCULAR | Status: DC | PRN
  Administered 2017-11-16: 2 mg via INTRAVENOUS

## 2017-11-16 MED ORDER — DEXAMETHASONE SODIUM PHOSPHATE 10 MG/ML IJ SOLN
INTRAMUSCULAR | Status: AC
  Filled 2017-11-16: qty 1

## 2017-11-16 MED ORDER — MEPERIDINE HCL 50 MG/ML IJ SOLN: 6.2500 mg | INTRAMUSCULAR | Status: DC | PRN

## 2017-11-16 MED ORDER — FENTANYL CITRATE (PF) 100 MCG/2ML IJ SOLN
INTRAMUSCULAR | Status: DC | PRN
  Administered 2017-11-16 (×2): 50 ug via INTRAVENOUS

## 2017-11-16 MED ORDER — EPHEDRINE SULFATE 50 MG/ML IJ SOLN
INTRAMUSCULAR | Status: DC | PRN
  Administered 2017-11-16 (×4): 10 mg via INTRAVENOUS

## 2017-11-16 MED ORDER — MIDAZOLAM HCL 2 MG/2ML IJ SOLN
INTRAMUSCULAR | Status: AC
  Filled 2017-11-16: qty 2

## 2017-11-16 MED ORDER — SUGAMMADEX SODIUM 200 MG/2ML IV SOLN
INTRAVENOUS | Status: DC | PRN
  Administered 2017-11-16: 200 mg via INTRAVENOUS

## 2017-11-16 MED ORDER — OXYCODONE HCL 5 MG PO TABS: 5.0000 mg | ORAL_TABLET | Freq: Once | ORAL | Status: DC | PRN

## 2017-11-16 MED ORDER — EPHEDRINE SULFATE 50 MG/ML IJ SOLN
INTRAMUSCULAR | Status: AC
  Filled 2017-11-16: qty 1

## 2017-11-16 MED ORDER — ONDANSETRON HCL 4 MG/2ML IJ SOLN
INTRAMUSCULAR | Status: AC
  Filled 2017-11-16: qty 2

## 2017-11-16 MED ORDER — HYDROCODONE-ACETAMINOPHEN 5-325 MG PO TABS: 1.0000 | ORAL_TABLET | Freq: Four times a day (QID) | ORAL | 0 refills | Status: DC | PRN

## 2017-11-16 MED ORDER — PROPOFOL 10 MG/ML IV BOLUS
INTRAVENOUS | Status: AC
  Filled 2017-11-16: qty 20

## 2017-11-16 MED ORDER — OXYCODONE HCL 5 MG/5ML PO SOLN: 5.0000 mg | Freq: Once | ORAL | Status: DC | PRN

## 2017-11-16 SURGICAL SUPPLY — 30 items
BAG DRAIN CYSTO-URO LG1000N (MISCELLANEOUS) ×2 IMPLANT
BASKET ZERO TIP 1.9FR (BASKET) ×2 IMPLANT
BRUSH SCRUB EZ 1% IODOPHOR (MISCELLANEOUS) ×2 IMPLANT
CATH URETL 5X70 OPEN END (CATHETERS) ×2 IMPLANT
CNTNR SPEC 2.5X3XGRAD LEK (MISCELLANEOUS) ×1
CONT SPEC 4OZ STER OR WHT (MISCELLANEOUS) ×1
CONTAINER SPEC 2.5X3XGRAD LEK (MISCELLANEOUS) ×1 IMPLANT
DRAPE UTILITY 15X26 TOWEL STRL (DRAPES) ×2 IMPLANT
DRSG TEGADERM 2-3/8X2-3/4 SM (GAUZE/BANDAGES/DRESSINGS) ×2 IMPLANT
FIBER LASER LITHO 273 (Laser) ×2 IMPLANT
GLOVE BIO SURGEON STRL SZ 6.5 (GLOVE) ×2 IMPLANT
GOWN STRL REUS W/ TWL LRG LVL3 (GOWN DISPOSABLE) ×2 IMPLANT
GOWN STRL REUS W/TWL LRG LVL3 (GOWN DISPOSABLE) ×2
GUIDEWIRE GREEN .038 145CM (MISCELLANEOUS) IMPLANT
INFUSOR MANOMETER BAG 3000ML (MISCELLANEOUS) ×2 IMPLANT
INTRODUCER DILATOR DOUBLE (INTRODUCER) IMPLANT
KIT TURNOVER CYSTO (KITS) ×2 IMPLANT
PACK CYSTO AR (MISCELLANEOUS) ×2 IMPLANT
SENSORWIRE 0.038 NOT ANGLED (WIRE) ×2
SET CYSTO W/LG BORE CLAMP LF (SET/KITS/TRAYS/PACK) ×2 IMPLANT
SHEATH URETERAL 12FRX35CM (MISCELLANEOUS) ×2 IMPLANT
SLEEVE GASTRECTOMY 36FR VISIGI (MISCELLANEOUS) ×2 IMPLANT
SOL .9 NS 3000ML IRR  AL (IV SOLUTION) ×1
SOL .9 NS 3000ML IRR UROMATIC (IV SOLUTION) ×1 IMPLANT
STENT URET 6FRX24 CONTOUR (STENTS) IMPLANT
STENT URET 6FRX26 CONTOUR (STENTS) ×2 IMPLANT
SURGILUBE 2OZ TUBE FLIPTOP (MISCELLANEOUS) ×2 IMPLANT
WATER STERILE IRR 1000ML POUR (IV SOLUTION) ×2 IMPLANT
WIRE AMPLATZ SSTIFF .035X260CM (WIRE) ×2 IMPLANT
WIRE SENSOR 0.038 NOT ANGLED (WIRE) ×1 IMPLANT

## 2017-11-16 NOTE — Discharge Instructions (Signed)
You have a ureteral stent in place.  This is a tube that extends from your kidney to your bladder.  This may cause urinary bleeding, burning with urination, and urinary frequency.  Please call our office or present to the ED if you develop fevers >101 or pain which is not able to be controlled with oral pain medications.  You may be given either Flomax and/ or ditropan to help with bladder spasms and stent pain in addition to pain medications.    Your stent is on a string.  You may remove it on Friday.  You may want to take a warm bath to help loosen the tape.  If any issues, please call our office.  [place] [address] [place], [place] [address] [phone]  AMBULATORY SURGERY  DISCHARGE INSTRUCTIONS   1) The drugs that you were given will stay in your system until tomorrow so for the next 24 hours you should not:  A) Drive an automobile B) Make any legal decisions C) Drink any alcoholic beverage   2) You may resume regular meals tomorrow.  Today it is better to start with liquids and gradually work up to solid foods.  You may eat anything you prefer, but it is better to start with liquids, then soup and crackers, and gradually work up to solid foods.   3) Please notify your doctor immediately if you have any unusual bleeding, trouble breathing, redness and pain at the surgery site, drainage, fever, or pain not relieved by medication.    4) Additional Instructions:        Please contact your physician with any problems or Same Day Surgery at [phone], Monday through Friday 6 am to 4 pm, or [place] at [facility] number at [phone].

## 2017-11-16 NOTE — Anesthesia Post-op Follow-up Note (Signed)
Anesthesia QCDR form completed.        

## 2017-11-16 NOTE — Anesthesia Postprocedure Evaluation (Signed)
Anesthesia Post Note  Patient: Felicia Manning  Procedure(s) Performed: CYSTOSCOPY/URETEROSCOPY/HOLMIUM LASER/STENT Exchange (Left Ureter)  Patient location during evaluation: PACU Anesthesia Type: General Level of consciousness: awake and alert and oriented Pain management: pain level controlled Vital Signs Assessment: post-procedure vital signs reviewed and stable Respiratory status: spontaneous breathing, nonlabored ventilation and respiratory function stable Cardiovascular status: blood pressure returned to baseline and stable Postop Assessment: no signs of nausea or vomiting Anesthetic complications: no     Last Vitals:  Vitals:   11/16/17 1349 11/16/17 1356  BP: (!) 120/56   Pulse: 81 80  Resp: 19 19  Temp: 36.8 C   SpO2: 98% 98%    Last Pain:  Vitals:   11/16/17 1356  TempSrc:   PainSc: 0-No pain                 Bentleigh Stankus

## 2017-11-16 NOTE — Interval H&P Note (Signed)
History and Physical Interval Note:  11/16/2017 12:10 PM  Felicia Manning  has presented today for surgery, with the diagnosis of left ureteral stone  The various methods of treatment have been discussed with the patient and family. After consideration of risks, benefits and other options for treatment, the patient has consented to  Procedure(s): CYSTOSCOPY/URETEROSCOPY/HOLMIUM LASER/STENT Exchange (Left) as a surgical intervention .  The patient's history has been reviewed, patient examined, no change in status, stable for surgery.  I have reviewed the patient's chart and labs.  Questions were answered to the patient's satisfaction.    RRR CTAB  PReop Cx negative  Hollice Espy

## 2017-11-16 NOTE — Anesthesia Procedure Notes (Addendum)

## 2017-11-16 NOTE — Transfer of Care (Signed)
Immediate Anesthesia Transfer of Care Note  Patient: Felicia Manning  Procedure(s) Performed: Procedure(s): CYSTOSCOPY/URETEROSCOPY/HOLMIUM LASER/STENT Exchange (Left)  Patient Location: PACU  Anesthesia Type:General  Level of Consciousness: sedated  Airway & Oxygen Therapy: Patient Spontanous Breathing and Patient connected to face mask oxygen  Post-op Assessment: Report given to RN and Post -op Vital signs reviewed and stable  Post vital signs: Reviewed and stable  Last Vitals:  Vitals:   11/16/17 1114 11/16/17 1349  BP: 122/79 (!) 120/56  Pulse: 76 81  Resp: 18 19  Temp: 36.8 C 36.8 C  SpO2: 04% 88%    Complications: No apparent anesthesia complications

## 2017-11-16 NOTE — Anesthesia Preprocedure Evaluation (Signed)
Anesthesia Evaluation  Patient identified by MRN, date of birth, ID band Patient awake    Reviewed: Allergy & Precautions, NPO status , Patient's Chart, lab work & pertinent test results  History of Anesthesia Complications Negative for: history of anesthetic complications  Airway Mallampati: II  TM Distance: >3 FB Neck ROM: Full    Dental  (+) Edentulous Upper, Edentulous Lower   Pulmonary neg sleep apnea, neg COPD, Current Smoker,    breath sounds clear to auscultation- rhonchi (-) wheezing      Cardiovascular Exercise Tolerance: Good hypertension, Pt. on medications (-) CAD, (-) Past MI, (-) Cardiac Stents and (-) CABG  Rhythm:Regular Rate:Normal - Systolic murmurs and - Diastolic murmurs    Neuro/Psych negative neurological ROS  negative psych ROS   GI/Hepatic GERD  ,(+) Hepatitis -, C  Endo/Other  neg diabetesHypothyroidism   Renal/GU Renal disease: nephrolithiasis.     Musculoskeletal negative musculoskeletal ROS (+)   Abdominal (+) - obese,   Peds  Hematology negative hematology ROS (+)   Anesthesia Other Findings Past Medical History: No date: Barrett's esophagus No date: Dysrhythmia     Comment:  stress related at work. No date: GERD (gastroesophageal reflux disease) 2008: Hepatitis C No date: Hyperlipidemia No date: Hypertension No date: Hypothyroidism No date: Nephrolithiasis No date: Osteoporosis 2012: Thyroid cancer ([place]) No date: Thyroid cancer ([place])   Reproductive/Obstetrics                             Anesthesia Physical Anesthesia Plan  ASA: II  Anesthesia Plan: General   Post-op Pain Management:    Induction: Intravenous  PONV Risk Score and Plan: 1 and Ondansetron and Midazolam  Airway Management Planned: Oral ETT  Additional Equipment:   Intra-op Plan:   Post-operative Plan: Extubation in OR  Informed Consent: I have reviewed the patients  History and Physical, chart, labs and discussed the procedure including the risks, benefits and alternatives for the proposed anesthesia with the patient or authorized representative who has indicated his/her understanding and acceptance.   Dental advisory given  Plan Discussed with: CRNA and Anesthesiologist  Anesthesia Plan Comments:         Anesthesia Quick Evaluation

## 2017-11-16 NOTE — Op Note (Signed)
Date of procedure: 11/16/17  Preoperative diagnosis:  1. Recurrent nephrolithiasis 2. Left ureteral calculus 3. History of sepsis of urinary source  Postoperative diagnosis:  1. Same as above  Procedure: 1. Left ureteroscopy with laser lithotripsy 2. Left ureteral stent exchange 3. Left retrograde pyelogram 4. Left basket extraction of stone fragment  Surgeon: Hollice Espy, MD  Anesthesia: General  Complications: None  Intraoperative findings: Proximal he 5 mm stone at the level of the left iliac fragmented and removed.  All upper tract stone burden on the left was also treated.  Stent replaced on tether.  EBL: Minimal  Specimens: N/A  Drains: 6 x 26 French double-J ureteral stent on left, string left in place on tether  Indication: Felicia Manning is a 62 y.o. patient with history of recurrent nephrolithiasis who presented with sepsis of urinary source and obstructing ureteral calculus who underwent emergent left ureteral stent placement.  She returns today for definitive management of her stone after her infection have been adequately cleared/controlled.  After reviewing the management options for treatment, she elected to proceed with the above surgical procedure(s). We have discussed the potential benefits and risks of the procedure, side effects of the proposed treatment, the likelihood of the patient achieving the goals of the procedure, and any potential problems that might occur during the procedure or recuperation. Informed consent has been obtained.  Description of procedure:  The patient was taken to the operating room and general anesthesia was induced.  The patient was placed in the dorsal lithotomy position, prepped and draped in the usual sterile fashion, and preoperative antibiotics were administered. A preoperative time-out was performed.   A 21 French scope was advanced per urethra into the bladder.  Attention was turned to the left ureteral orifice which a  ureteral stent was seen emanating.  The distal coil of the stent was grasped brought to level the urethral meatus.  The stent was then cannulated using a sensor wire up to level the kidney.  This was snapped in place as a safety wire.  A 4.5 semirigid ureteroscope was advanced alongside the wire into the distal ureter where the stone was encountered, approximately 5 mm at the level of the iliacs.  A 273 m laser fiber was then brought in and using the settings of 0.8 J and 10 Hz, the stone was fragmented into 3 or 4 smaller pieces.  Each of these pieces was then extracted using 1.9 [place] tipless nitinol basket.  Next, a Super Stiff wire was advanced up to level the kidney as a working wire.  A Bard access sheath, 12/14 [place] was advanced up to level the proximal ureter without difficulty under fluoroscopic guidance.  The inner cannula was removed.  A dual-lumen 8 French digital ureteroscope was advanced into the kidney.  Formal pyeloscopy was performed in each every calyx was visualized.  There were stones identified in the mid and lower poles which were treated using dusting settings, 0.2 J and 40 Hz to dust the stone.  If the stone was fragmented into much smaller pieces, the tipless nitinol basket was used to extract all significant stone burden.  A final retrograde pyelogram was performed which showed no extravasation or hydronephrosis.  This also create a roadmap to ensure that each every calyx was visualized and there is no significant residual stone burden.  The scope was then backed down the length of the ureter inspecting along the way.  There are no ureteral injuries or any additional fragments appreciated.  A  6 x 26 French double-J ureteral stent was then advanced up to level the kidney.  The wires partially drawn until focal stone within the renal pelvis.  The wires and fully withdrawn a full coils noted within the bladder.  The tether was left attached to the left distal stent coil.  This was affixed  to the patient's left inner thigh using Mastisol and Tegaderm.  She was then clean and dry, repositioned supine position, reversed from anesthesia, and taken to the PACU in stable condition.  Plan: Patient will remove her own stent on Friday.  She will follow-up in 4 weeks with renal ultrasound prior.  Hollice Espy, M.D.

## 2017-11-17 ENCOUNTER — Encounter: Payer: Self-pay | Admitting: Urology

## 2017-11-24 ENCOUNTER — Other Ambulatory Visit: Payer: Self-pay | Admitting: Internal Medicine

## 2017-11-24 DIAGNOSIS — Z1231 Encounter for screening mammogram for malignant neoplasm of breast: Secondary | ICD-10-CM

## 2017-12-06 ENCOUNTER — Ambulatory Visit
Admission: RE | Admit: 2017-12-06 | Discharge: 2017-12-06 | Disposition: A | Discharge status: Home / Self Care | Payer: 59 | Source: Ambulatory Visit | Attending: Family Medicine | Admitting: Family Medicine

## 2017-12-06 ENCOUNTER — Other Ambulatory Visit: Payer: Self-pay | Admitting: Family Medicine

## 2017-12-06 DIAGNOSIS — K5710 Diverticulosis of small intestine without perforation or abscess without bleeding: Secondary | ICD-10-CM | POA: Diagnosis not present

## 2017-12-06 DIAGNOSIS — R1032 Left lower quadrant pain: Secondary | ICD-10-CM

## 2017-12-06 DIAGNOSIS — K449 Diaphragmatic hernia without obstruction or gangrene: Secondary | ICD-10-CM | POA: Insufficient documentation

## 2017-12-06 DIAGNOSIS — N281 Cyst of kidney, acquired: Secondary | ICD-10-CM | POA: Insufficient documentation

## 2017-12-06 DIAGNOSIS — N200 Calculus of kidney: Secondary | ICD-10-CM | POA: Insufficient documentation

## 2017-12-06 DIAGNOSIS — K5732 Diverticulitis of large intestine without perforation or abscess without bleeding: Secondary | ICD-10-CM | POA: Diagnosis not present

## 2017-12-06 DIAGNOSIS — I700 Atherosclerosis of aorta: Secondary | ICD-10-CM | POA: Insufficient documentation

## 2017-12-06 MED ORDER — IOPAMIDOL (ISOVUE-300) INJECTION 61%
100.0000 mL | Freq: Once | INTRAVENOUS | Status: AC | PRN
  Administered 2017-12-06: 100 mL via INTRAVENOUS

## 2017-12-07 ENCOUNTER — Telehealth: Payer: Self-pay | Admitting: Urology

## 2017-12-07 ENCOUNTER — Ambulatory Visit: Payer: 59

## 2017-12-07 NOTE — Telephone Encounter (Signed)
That is correct and good advice.  I will see her as scheduled.  Hollice Espy, MD

## 2017-12-07 NOTE — Telephone Encounter (Signed)
Pt went to [facility] yesterday and they did a CT scan, so pt cancelled RUS.  They told her that the CT scan should be able to show the same thing as RUS.  She has appt w/Brandon 10/30 @ 8:30.  Please let pt know if this is O.K.  [phone]

## 2017-12-07 NOTE — Telephone Encounter (Signed)
Is this OK?

## 2017-12-07 NOTE — Telephone Encounter (Signed)
Patient notified

## 2017-12-14 ENCOUNTER — Ambulatory Visit (INDEPENDENT_AMBULATORY_CARE_PROVIDER_SITE_OTHER): Payer: 59 | Admitting: Urology

## 2017-12-14 ENCOUNTER — Encounter: Payer: Self-pay | Admitting: Urology

## 2017-12-14 VITALS — BP 105/65 | HR 76 | Ht 65.0 in | Wt 182.0 lb

## 2017-12-14 DIAGNOSIS — N200 Calculus of kidney: Secondary | ICD-10-CM

## 2017-12-14 DIAGNOSIS — N390 Urinary tract infection, site not specified: Secondary | ICD-10-CM | POA: Diagnosis not present

## 2017-12-14 MED ORDER — ESTROGENS, CONJUGATED 0.625 MG/GM VA CREA: 1.0000 | TOPICAL_CREAM | Freq: Every day | VAGINAL | 12 refills | Status: DC

## 2017-12-14 NOTE — Patient Instructions (Addendum)
Probiotic   Ellura cranberry- on [employer]  Topical estrogen cream- three times per week

## 2017-12-14 NOTE — Progress Notes (Signed)
12/14/2017 9:57 AM   Felicia Manning [birth year] [id number]  Referring provider: Glendon Axe, MD [facility] [place], [place] [address]  Chief Complaint  Patient presents with  . Post-op Follow-up    4wk w/results    HPI: 62 year old female who presents today for routine follow-up.  Over the past year, she struggled with recurrent episodes of sepsis related to urinary source.  More recently, she was admitted in 10/2017 with an obstructing ureteral stone with concern for infection.  She underwent stenting followed by staged ureteroscopy completed on 11/16/2017 treating her left-sided stone burden.  Her stent was subsequently removed.  Last week, she presented to her primary care with severe left-sided abdominal pain, nausea vomiting, diarrhea, and a white count to 22.  She underwent CT abdomen pelvis with contrast on 12/06/2017 which showed left-sided diverticulitis.  She is now currently on Cipro Flagyl.  Her pain is resolving.  Notably on the CT scan, her left kidney appears unremarkable without any residual hydronephrosis.  Her left-sided stone burden was minimal other than a few likely parenchymal stones.  She has stones on the right side up to 1 cm but some of these are also likely parenchymal.  Today, she denies any fevers chills, gross hematuria, or dysuria.  She is overall feeling better.  Past stone history: Stone analysis consistent with 65% calcium oxalate monohydrate, 3% calcium oxalate dihydrate, and 32% calcium phosphate.   She does have a personal history of stones which have required surgical intervetnion on multiple occasions including ureteroscopyx multipleand ESWL (unsuccessful).   Her most recent procedure was in 06/2016 for a right distal ureteral stone.  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  She did previously  undergo 24 hour urine metabolic workup which revealed a fairly decent urinary volume of 2.2 L, no evidence of hypercalcemia. Noother obvious pathologic findings. She did have a good urinary citrate.    PMH: Past Medical History:  Diagnosis Date  . Barrett's esophagus   . Dysrhythmia    stress related at work.  Marland Kitchen GERD (gastroesophageal reflux disease)   . Hepatitis C 2008  . Hyperlipidemia   . Hypertension   . Hypothyroidism   . Nephrolithiasis   . Osteoporosis   . Thyroid cancer ([place]) 2012  . Thyroid cancer [facility])     Surgical History: Past Surgical History:  Procedure Laterality Date  . ABDOMINAL HYSTERECTOMY  2006  . COLONOSCOPY WITH PROPOFOL N/A 04/25/2015   Procedure: COLONOSCOPY WITH PROPOFOL;  Surgeon: Josefine Class, MD;  Location: [facility] ENDOSCOPY;  Service: Endoscopy;  Laterality: N/A;  . CYSTOSCOPY W/ RETROGRADES Bilateral 05/19/2015   Procedure: CYSTOSCOPY WITH RETROGRADE PYELOGRAM;  Surgeon: Hollice Espy, MD;  Location: [facility] ORS;  Service: Urology;  Laterality: Bilateral;  . CYSTOSCOPY W/ URETERAL STENT PLACEMENT Right 05/26/2015   Procedure: CYSTOSCOPY WITH STENT REPLACEMENT;  Surgeon: Hollice Espy, MD;  Location: [facility] ORS;  Service: Urology;  Laterality: Right;  . CYSTOSCOPY WITH STENT PLACEMENT Right 05/19/2015   Procedure: CYSTOSCOPY WITH STENT PLACEMENT;  Surgeon: Hollice Espy, MD;  Location: [facility] ORS;  Service: Urology;  Laterality: Right;  . CYSTOSCOPY WITH STENT PLACEMENT Right 05/26/2015   Procedure: CYSTOSCOPY WITH STENT PLACEMENT;  Surgeon: Hollice Espy, MD;  Location: [facility] ORS;  Service: Urology;  Laterality: Right;  . CYSTOSCOPY WITH STENT PLACEMENT Right 06/25/2016   Procedure: CYSTOSCOPY WITH STENT PLACEMENT;  Surgeon: Nickie Retort, MD;  Location:  [facility] ORS;  Service: Urology;  Laterality: Right;  . CYSTOSCOPY WITH STENT PLACEMENT Left 10/31/2017   Procedure: CYSTOSCOPY WITH STENT PLACEMENT;  Surgeon: Hollice Espy, MD;  Location: [facility] ORS;   Service: Urology;  Laterality: Left;  . CYSTOSCOPY/URETEROSCOPY/HOLMIUM LASER/STENT PLACEMENT Left 07/23/2015   Procedure: CYSTOSCOPY/URETEROSCOPY/HOLMIUM LASER/STENT PLACEMENT/RETROGRADE PYELOGRAM;  Surgeon: Hollice Espy, MD;  Location: [facility] ORS;  Service: Urology;  Laterality: Left;  . CYSTOSCOPY/URETEROSCOPY/HOLMIUM LASER/STENT PLACEMENT Left 11/16/2017   Procedure: CYSTOSCOPY/URETEROSCOPY/HOLMIUM LASER/STENT Exchange;  Surgeon: Hollice Espy, MD;  Location: [facility] ORS;  Service: Urology;  Laterality: Left;  . ESOPHAGOGASTRODUODENOSCOPY (EGD) WITH PROPOFOL N/A 04/25/2015   Procedure: ESOPHAGOGASTRODUODENOSCOPY (EGD) WITH PROPOFOL;  Surgeon: Josefine Class, MD;  Location: [facility] ENDOSCOPY;  Service: Endoscopy;  Laterality: N/A;  . EYE SURGERY Bilateral 1964   lazy eye repair  . EYE SURGERY Bilateral 2001   lazy eye repair  . LITHOTRIPSY  2009  . THYROIDECTOMY  2010  . URETEROSCOPY WITH HOLMIUM LASER LITHOTRIPSY Right 05/19/2015   Procedure: URETEROSCOPY WITH HOLMIUM LASER LITHOTRIPSY;  Surgeon: Hollice Espy, MD;  Location: [facility] ORS;  Service: Urology;  Laterality: Right;  . URETEROSCOPY WITH HOLMIUM LASER LITHOTRIPSY Right 05/26/2015   Procedure: URETEROSCOPY WITH HOLMIUM LASER LITHOTRIPSY;  Surgeon: Hollice Espy, MD;  Location: [facility] ORS;  Service: Urology;  Laterality: Right;  . URETEROSCOPY WITH HOLMIUM LASER LITHOTRIPSY Right 06/25/2016   Procedure: URETEROSCOPY WITH HOLMIUM LASER LITHOTRIPSY;  Surgeon: Nickie Retort, MD;  Location: [facility] ORS;  Service: Urology;  Laterality: Right;    Home Medications:  Allergies as of 12/14/2017   No Known Allergies     Medication List        Accurate as of 12/14/17 11:59 PM. Always use your most recent med list.          amLODipine 5 MG tablet Commonly known as:  NORVASC Take 5 mg by mouth every morning.   AZO CRANBERRY PO Take 1 tablet by mouth daily.   cholecalciferol 1000 units tablet Commonly known as:  VITAMIN D Take 2,000  Units by mouth daily.   conjugated estrogens vaginal cream Commonly known as:  PREMARIN Place 1 Applicatorful vaginally daily. Use pea sized amount M-W-Fr before bedtime   levothyroxine 150 MCG tablet Commonly known as:  SYNTHROID, LEVOTHROID Take 150 mcg by mouth at bedtime.   losartan 100 MG tablet Commonly known as:  COZAAR Take 100 mg by mouth every morning.   metoprolol succinate 25 MG 24 hr tablet Commonly known as:  TOPROL-XL Take 25 mg by mouth every morning.   omeprazole 20 MG capsule Commonly known as:  PRILOSEC Take 20 mg by mouth daily before breakfast.       Allergies: No Known Allergies  Family History: Family History  Problem Relation Age of Onset  . CAD Mother   . Heart disease Mother   . Dementia Father   . Bladder Cancer Neg Hx   . Prostate cancer Neg Hx   . Kidney cancer Neg Hx     Social History:  reports that she has been smoking cigarettes. She has a 40.00 pack-year smoking history. She has never used smokeless tobacco. She reports that she drinks alcohol. She reports that she does not use drugs.  ROS: UROLOGY Frequent Urination?: Yes Hard to postpone urination?: No Burning/pain with urination?: No Get up at night to urinate?: No Leakage of urine?: Yes Urine stream starts and stops?: No Trouble starting stream?: No Do you have to strain to urinate?: No Blood in urine?: No Urinary tract  infection?: No Sexually transmitted disease?: No Injury to kidneys or bladder?: No Painful intercourse?: No Weak stream?: No Currently pregnant?: No Vaginal bleeding?: No Last menstrual period?: n  Gastrointestinal Nausea?: No Vomiting?: No Indigestion/heartburn?: No Diarrhea?: No Constipation?: No  Constitutional Fever: No Night sweats?: No Weight loss?: No Fatigue?: No  Skin Skin rash/lesions?: No Itching?: No  Eyes Blurred vision?: No Double vision?: No  Ears/Nose/Throat Sore throat?: No Sinus problems?:  No  Hematologic/Lymphatic Swollen glands?: No Easy bruising?: No  Cardiovascular Leg swelling?: No Chest pain?: No  Respiratory Cough?: No Shortness of breath?: No  Endocrine Excessive thirst?: No  Musculoskeletal Back pain?: No Joint pain?: No  Neurological Headaches?: No Dizziness?: No  Psychologic Depression?: No Anxiety?: No  Physical Exam: BP 105/65   Pulse 76   Ht 5\' 5"  (1.651 m)   Wt 182 lb (82.6 kg)   BMI 30.29 kg/m   Constitutional:  Alert and oriented, No acute distress.  Accompanied by daughter today. HEENT: [place] AT, moist mucus membranes.  Trachea midline, no masses. Cardiovascular: No clubbing, cyanosis, or edema. Respiratory: Normal respiratory effort, no increased work of breathing. GI: Abdomen is soft, nontender, nondistended, no abdominal masses GU: No CVA tenderness Skin: No rashes, bruises or suspicious lesions. Neurologic: Grossly intact, no focal deficits, moving all 4 extremities. Psychiatric: Normal mood and affect.  Laboratory Data: Lab Results  Component Value Date   WBC 11.9 (H) 11/01/2017   HGB 12.4 11/01/2017   HCT 36.5 11/01/2017   MCV 89.7 11/01/2017   PLT 190 11/01/2017    Lab Results  Component Value Date   CREATININE 0.74 11/01/2017    Urinalysis Asymptomatic today- urinalysis not sent  Pertinent Imaging: CLINICAL DATA:  62 y/o F; left-sided abdominal pain with fever, nausea, and vomiting. Intermittent cramping and diarrhea. Recent left-sided kidney stone with stent placement 11/16/2017.  EXAM: CT ABDOMEN AND PELVIS WITH CONTRAST  TECHNIQUE: Multidetector CT imaging of the abdomen and pelvis was performed using the standard protocol following bolus administration of intravenous contrast.  CONTRAST:  166mL ISOVUE-300 IOPAMIDOL (ISOVUE-300) INJECTION 61%  COMPARISON:  10/31/2017 CT abdomen and pelvis.  FINDINGS: Lower chest: Stable moderate hiatal hernia.  Hepatobiliary: 7 mm enhancing focus in  liver segment 5, isointense on delayed phase, possibly a hemangioma or transient attenuation difference. Stable cysts in the right lobe of liver. No additional focal liver lesion. Normal gallbladder. No biliary ductal dilatation.  Pancreas: Unremarkable. No pancreatic ductal dilatation or surrounding inflammatory changes.  Spleen: Normal in size without focal abnormality.  Adrenals/Urinary Tract: Adrenal glands are unremarkable. Multiple nonobstructing kidney stones are present bilaterally measuring up to 10 mm in the lower pole of right kidney. Multiple bilateral renal cysts measuring up to 4.6 cm in the right interpolar kidney. No hydronephrosis or ureter stone. Normal bladder.  Stomach/Bowel: Normal appearance of stomach, small bowel, and appendix. Wall thickening, pericolonic fat stranding, and diverticulosis of the sigmoid colon. No findings of perforation or abscess. 26 mm duodenum diverticulum arising from the second segment of duodenum.  Vascular/Lymphatic: Aortic atherosclerosis. No enlarged abdominal or pelvic lymph nodes.  Reproductive: Status post hysterectomy. No adnexal masses.  Other: No abdominal wall hernia or abnormality. No abdominopelvic ascites.  Musculoskeletal: No fracture is seen. Lumbar spondylosis greatest at the L5-S1 level with there is loss of intervertebral disc space height and a prominent calcified disc bulge.  IMPRESSION: 1. Acute sigmoid diverticulitis. No evidence for perforation or abscess. 2. Stable moderate hiatal hernia. 3. Bilateral nonobstructing kidney stones. 4. Stable liver and  renal cysts. 5. Stable duodenum diverticulum. 6. Aortic Atherosclerosis (ICD10-I70.0).   Electronically Signed   By: Kristine Garbe M.D.   On: 12/06/2017 14:34  CT scan personally reviewed today.  Assessment & Plan:    1. Recurrent UTI Lengthy conversation today about that she is likely colonized with E. coli She has had  several serious infections requiring hospital admissions over the past year We discussed initiation of topical estrogen cream today, pea-sized amount per urethral meatus 3 times a week to reduce UTI frequency In addition to this, I have encouraged her to consider medical strength cranberry tablets, Ellura and probiotic She is essentially been on antibiotics almost continuously over the past year thus role for suppression somewhat limited  2. Recurrent kidney stones Test post recent left-sided ureteroscopy with significantly decreased left-sided stone burden All residual left-sided stone likely intraparenchymal Right-sided stone burden up to 1 cm but also portion of this is also parenchymal-assessable endoscopically Viewed stone diet recommendations Previously underwent metabolic work-up which was fairly unremarkable   Return in about 6 months (around 06/15/2018).  Hollice Espy, MD  [facility] [address] [place], [place] [address] [phone]

## 2017-12-26 ENCOUNTER — Ambulatory Visit
Admission: RE | Admit: 2017-12-26 | Discharge: 2017-12-26 | Disposition: A | Discharge status: Home / Self Care | Payer: 59 | Source: Ambulatory Visit | Attending: Internal Medicine | Admitting: Internal Medicine

## 2017-12-26 DIAGNOSIS — Z1231 Encounter for screening mammogram for malignant neoplasm of breast: Secondary | ICD-10-CM | POA: Diagnosis present

## 2018-09-05 ENCOUNTER — Ambulatory Visit: Payer: 59 | Admitting: Urology

## 2018-10-04 ENCOUNTER — Other Ambulatory Visit: Payer: Self-pay | Admitting: *Deleted

## 2018-10-04 DIAGNOSIS — N200 Calculus of kidney: Secondary | ICD-10-CM

## 2018-10-05 ENCOUNTER — Ambulatory Visit
Admission: RE | Admit: 2018-10-05 | Discharge: 2018-10-05 | Disposition: A | Discharge status: Home / Self Care | Payer: 59 | Source: Ambulatory Visit | Attending: Urology | Admitting: Urology

## 2018-10-05 ENCOUNTER — Ambulatory Visit (INDEPENDENT_AMBULATORY_CARE_PROVIDER_SITE_OTHER): Payer: 59 | Admitting: Urology

## 2018-10-05 ENCOUNTER — Other Ambulatory Visit: Payer: Self-pay

## 2018-10-05 ENCOUNTER — Encounter: Payer: Self-pay | Admitting: Urology

## 2018-10-05 VITALS — BP 143/87 | HR 80 | Ht 65.0 in | Wt 190.0 lb

## 2018-10-05 DIAGNOSIS — N200 Calculus of kidney: Secondary | ICD-10-CM

## 2018-10-05 DIAGNOSIS — N390 Urinary tract infection, site not specified: Secondary | ICD-10-CM | POA: Diagnosis not present

## 2018-10-05 NOTE — Progress Notes (Signed)
10/05/2018 11:43 AM   Felicia Manning 11-02-1955 [id number]  Referring provider: Glendon Axe, MD [facility] [place],  [place] [address]  Chief Complaint  Patient presents with  . 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  She was last seen on 11/2017.  Since her last visit, she is done exceptionally well.  She used topical estrogen cream for a couple months but has since abandoned this effort.  She also used Ellura for several months but is now using the Azo form of cranberry/probiotic daily.  She is not had any urinary tract infections or any other serious illness in the interim.  She denies any dysuria or gross hematuria.  No urgency or frequency.  3 pleased with her urinary symptoms.  She also has a personal history of recurrent nephrolithiasis.  She is undergone ureteroscopy procedures as well as ESWL's in the past.  Please see previous notes for details.  Her last procedure was on 06/2016 for distal obstructing ureteral calculus.  She also had several metabolic evaluations which were fairly unremarkable other than borderline low urinary volume.  Since her last visit, she is had no further stone episodes.  KUB today shows stable bilateral stone burden, right (up to 1 cm) greater than left.  A number of these are intraparenchymal.   PMH: Past Medical History:  Diagnosis Date  . Barrett's esophagus   . Dysrhythmia    stress related at work.  Marland Kitchen GERD (gastroesophageal reflux disease)   . Hepatitis C 2008  . Hyperlipidemia   . Hypertension   . Hypothyroidism   . Nephrolithiasis   . Osteoporosis   . Thyroid cancer ([place]) 2012  . Thyroid cancer [facility])     Surgical History: Past Surgical History:  Procedure Laterality Date  . ABDOMINAL HYSTERECTOMY  2006  . COLONOSCOPY WITH PROPOFOL N/A 04/25/2015   Procedure: COLONOSCOPY  WITH PROPOFOL;  Surgeon: Josefine Class, MD;  Location: [facility] ENDOSCOPY;  Service: Endoscopy;  Laterality: N/A;  . CYSTOSCOPY W/ RETROGRADES Bilateral 05/19/2015   Procedure: CYSTOSCOPY WITH RETROGRADE PYELOGRAM;  Surgeon: Hollice Espy, MD;  Location: [facility] ORS;  Service: Urology;  Laterality: Bilateral;  . CYSTOSCOPY W/ URETERAL STENT PLACEMENT Right 05/26/2015   Procedure: CYSTOSCOPY WITH STENT REPLACEMENT;  Surgeon: Hollice Espy, MD;  Location: [facility] ORS;  Service: Urology;  Laterality: Right;  . CYSTOSCOPY WITH STENT PLACEMENT Right 05/19/2015   Procedure: CYSTOSCOPY WITH STENT PLACEMENT;  Surgeon: Hollice Espy, MD;  Location: [facility] ORS;  Service: Urology;  Laterality: Right;  . CYSTOSCOPY WITH STENT PLACEMENT Right 05/26/2015   Procedure: CYSTOSCOPY WITH STENT PLACEMENT;  Surgeon: Hollice Espy, MD;  Location: [facility] ORS;  Service: Urology;  Laterality: Right;  . CYSTOSCOPY WITH STENT PLACEMENT Right 06/25/2016   Procedure: CYSTOSCOPY WITH STENT PLACEMENT;  Surgeon: Nickie Retort, MD;  Location: [facility] ORS;  Service: Urology;  Laterality: Right;  . CYSTOSCOPY WITH STENT PLACEMENT Left 10/31/2017   Procedure: CYSTOSCOPY WITH STENT PLACEMENT;  Surgeon: Hollice Espy, MD;  Location: [facility] ORS;  Service: Urology;  Laterality: Left;  . CYSTOSCOPY/URETEROSCOPY/HOLMIUM LASER/STENT PLACEMENT Left 07/23/2015   Procedure: CYSTOSCOPY/URETEROSCOPY/HOLMIUM LASER/STENT PLACEMENT/RETROGRADE PYELOGRAM;  Surgeon: Hollice Espy, MD;  Location: [facility] ORS;  Service: Urology;  Laterality: Left;  . CYSTOSCOPY/URETEROSCOPY/HOLMIUM LASER/STENT PLACEMENT Left 11/16/2017   Procedure: CYSTOSCOPY/URETEROSCOPY/HOLMIUM LASER/STENT Exchange;  Surgeon: Hollice Espy, MD;  Location: [facility] ORS;  Service: Urology;  Laterality: Left;  .  ESOPHAGOGASTRODUODENOSCOPY (EGD) WITH PROPOFOL N/A 04/25/2015   Procedure: ESOPHAGOGASTRODUODENOSCOPY (EGD) WITH PROPOFOL;  Surgeon: Josefine Class, MD;  Location: [facility] ENDOSCOPY;  Service:  Endoscopy;  Laterality: N/A;  . EYE SURGERY Bilateral 1964   lazy eye repair  . EYE SURGERY Bilateral 2001   lazy eye repair  . LITHOTRIPSY  2009  . THYROIDECTOMY  2010  . URETEROSCOPY WITH HOLMIUM LASER LITHOTRIPSY Right 05/19/2015   Procedure: URETEROSCOPY WITH HOLMIUM LASER LITHOTRIPSY;  Surgeon: Hollice Espy, MD;  Location: [facility] ORS;  Service: Urology;  Laterality: Right;  . URETEROSCOPY WITH HOLMIUM LASER LITHOTRIPSY Right 05/26/2015   Procedure: URETEROSCOPY WITH HOLMIUM LASER LITHOTRIPSY;  Surgeon: Hollice Espy, MD;  Location: [facility] ORS;  Service: Urology;  Laterality: Right;  . URETEROSCOPY WITH HOLMIUM LASER LITHOTRIPSY Right 06/25/2016   Procedure: URETEROSCOPY WITH HOLMIUM LASER LITHOTRIPSY;  Surgeon: Nickie Retort, MD;  Location: [facility] ORS;  Service: Urology;  Laterality: Right;    Home Medications:  Allergies as of 10/05/2018   No Known Allergies     Medication List       Accurate as of October 05, 2018 11:43 AM. If you have any questions, ask your nurse or doctor.        amLODipine 5 MG tablet Commonly known as: NORVASC Take 5 mg by mouth every morning.   AZO CRANBERRY PO Take 1 tablet by mouth daily.   cholecalciferol 1000 units tablet Commonly known as: VITAMIN D Take 2,000 Units by mouth daily.   conjugated estrogens vaginal cream Commonly known as: Premarin Place 1 Applicatorful vaginally daily. Use pea sized amount M-W-Fr before bedtime   levothyroxine 150 MCG tablet Commonly known as: SYNTHROID Take 150 mcg by mouth at bedtime.   losartan 100 MG tablet Commonly known as: COZAAR Take 100 mg by mouth every morning.   metoprolol succinate 25 MG 24 hr tablet Commonly known as: TOPROL-XL Take 25 mg by mouth every morning.   omeprazole 20 MG capsule Commonly known as: PRILOSEC Take 20 mg by mouth daily before breakfast.       Allergies: No Known Allergies  Family History: Family History  Problem Relation Age of Onset  . CAD Mother   .  Heart disease Mother   . Dementia Father   . Bladder Cancer Neg Hx   . Prostate cancer Neg Hx   . Kidney cancer Neg Hx   . Breast cancer Neg Hx     Social History:  reports that she has been smoking cigarettes. She has a 40.00 pack-year smoking history. She has never used smokeless tobacco. She reports current alcohol use. She reports that she does not use drugs.  ROS: UROLOGY Frequent Urination?: Yes Hard to postpone urination?: Yes Burning/pain with urination?: No Get up at night to urinate?: No Leakage of urine?: Yes Urine stream starts and stops?: No Trouble starting stream?: No Do you have to strain to urinate?: No Blood in urine?: No Urinary tract infection?: No Sexually transmitted disease?: No Injury to kidneys or bladder?: No Painful intercourse?: No Weak stream?: No Currently pregnant?: No Vaginal bleeding?: No Last menstrual period?: n  Gastrointestinal Nausea?: No Vomiting?: No Indigestion/heartburn?: No Diarrhea?: No Constipation?: No  Constitutional Fever: No Night sweats?: No Weight loss?: No Fatigue?: No  Skin Skin rash/lesions?: No Itching?: No  Eyes Blurred vision?: No Double vision?: No  Ears/Nose/Throat Sore throat?: No Sinus problems?: No  Hematologic/Lymphatic Swollen glands?: No Easy bruising?: No  Cardiovascular Leg swelling?: No Chest pain?: No  Respiratory Cough?: No Shortness of  breath?: No  Endocrine Excessive thirst?: No  Musculoskeletal Back pain?: No Joint pain?: No  Neurological Headaches?: No Dizziness?: No  Psychologic Depression?: No Anxiety?: No  Physical Exam: BP (!) 143/87   Pulse 80   Ht 5\' 5"  (1.651 m)   Wt 190 lb (86.2 kg)   BMI 31.62 kg/m   Constitutional:  Alert and oriented, No acute distress. HEENT: [place] AT, moist mucus membranes.  Trachea midline, no masses. Cardiovascular: No clubbing, cyanosis, or edema. Respiratory: Normal respiratory effort, no increased work of breathing.  Skin: No rashes, bruises or suspicious lesions. Neurologic: Grossly intact, no focal deficits, moving all 4 extremities. Psychiatric: Normal mood and affect.  Laboratory Data: Lab Results  Component Value Date   WBC 11.9 (H) 11/01/2017   HGB 12.4 11/01/2017   HCT 36.5 11/01/2017   MCV 89.7 11/01/2017   PLT 190 11/01/2017    Lab Results  Component Value Date   CREATININE 0.74 11/01/2017    Pertinent Imaging: KUB images were personally reviewed today.  They appear stable compared directly to 11/2017.  Assessment & Plan:    1. Nephrolithiasis Recurrent bilateral nephrolithiasis, right greater than left This is unchanged from 1 year which is reassuring that hers metabolic stone disease is stabilized Continue to encourage increased fluid intake and good dietary habits We will plan for KUB in 1 year unless he becomes symptomatic prior  2. 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  Hollice Espy, MD  [facility] [address] [place], [place] [address] [phone]

## 2019-01-03 ENCOUNTER — Other Ambulatory Visit: Payer: Self-pay | Admitting: Family Medicine

## 2019-01-03 DIAGNOSIS — Z1231 Encounter for screening mammogram for malignant neoplasm of breast: Secondary | ICD-10-CM

## 2019-01-21 IMAGING — MG DIGITAL SCREENING BILATERAL MAMMOGRAM WITH TOMO AND CAD
8 series · 8 of 24 positions shown · non-contrast
Comparison: Previous exam(s).

CLINICAL DATA: Screening.

EXAM:
DIGITAL SCREENING BILATERAL MAMMOGRAM WITH TOMO AND CAD

[R CC synth-2D]
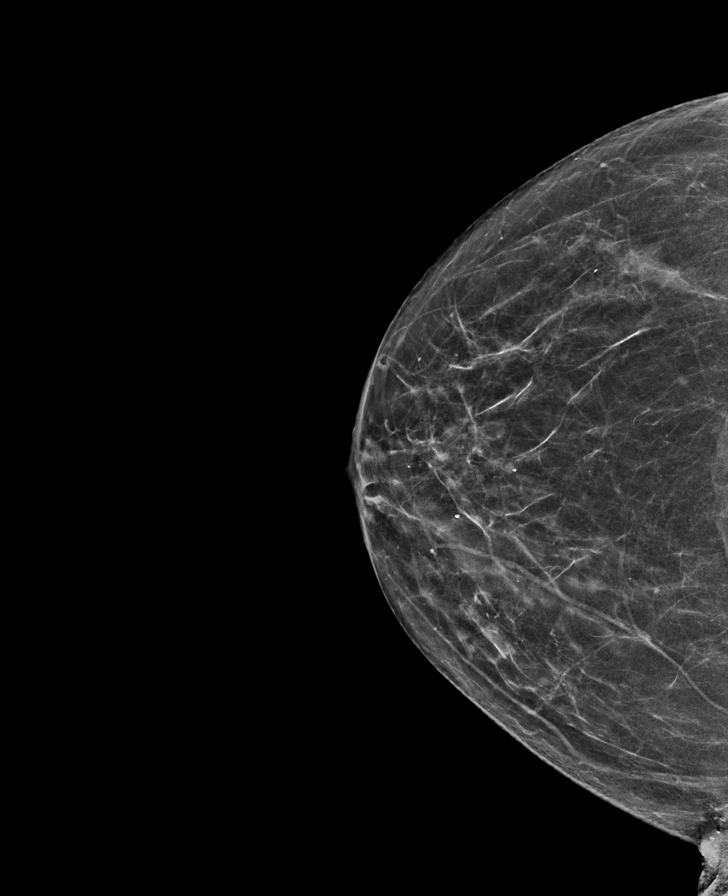

[R MLO synth-2D]
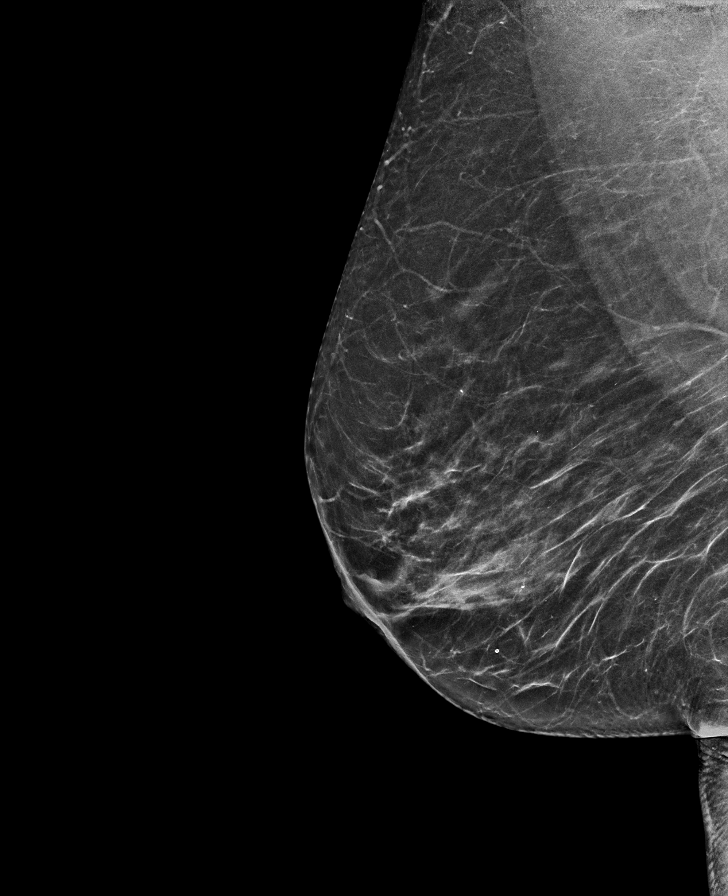

[L MLO synth-2D]
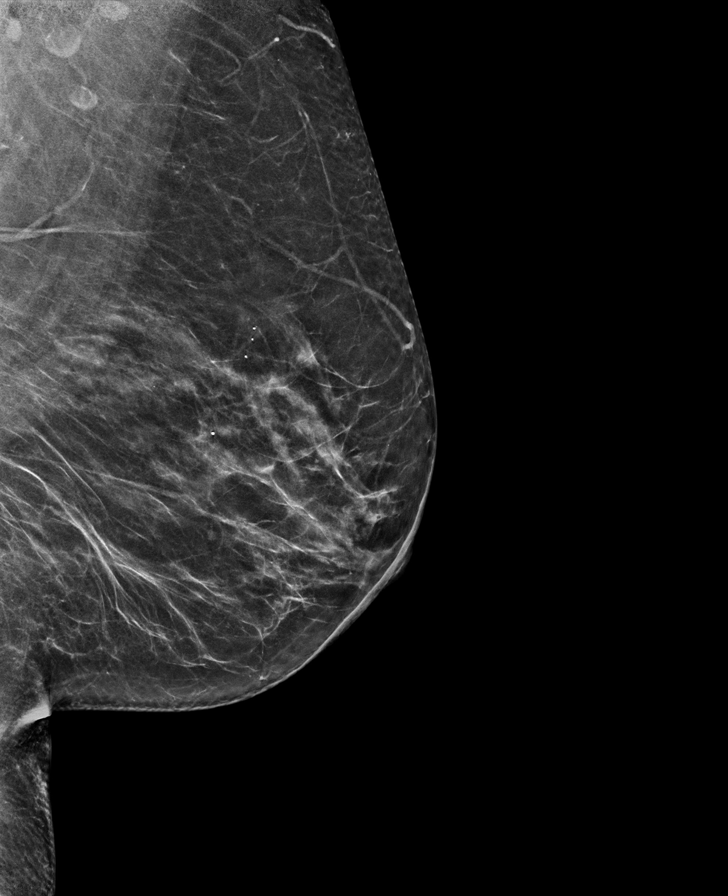

[L CC synth-2D]
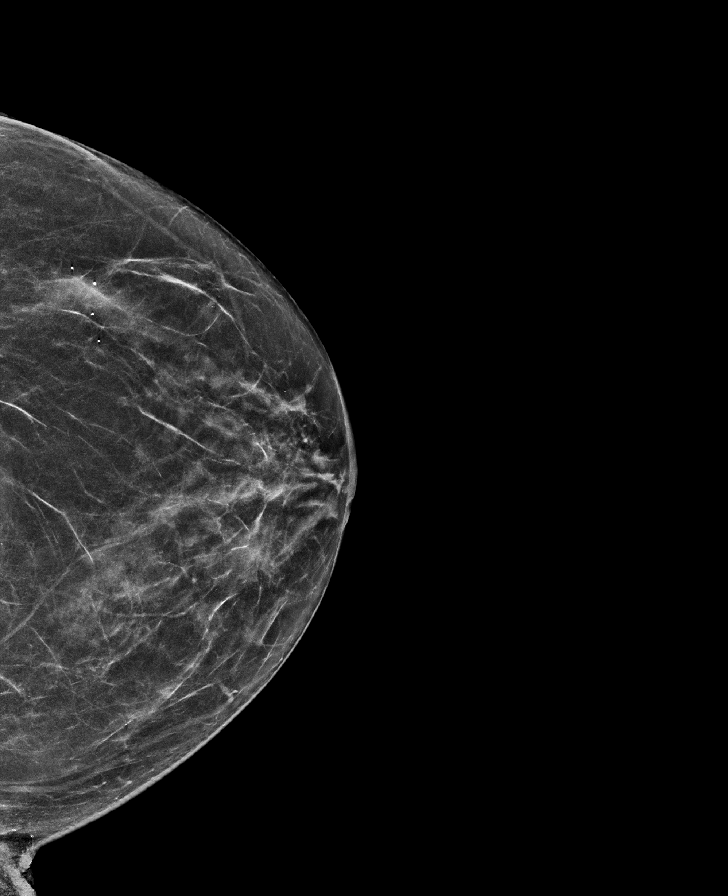

[L MLO tomo · tomo slice 33/64.0]
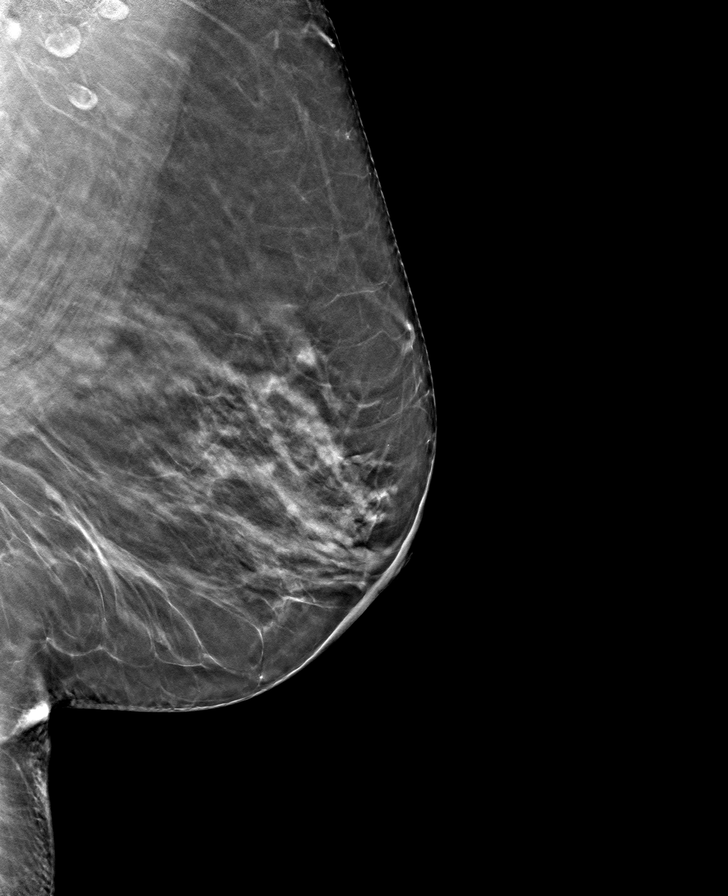

[R MLO tomo · tomo slice 33/65.0]
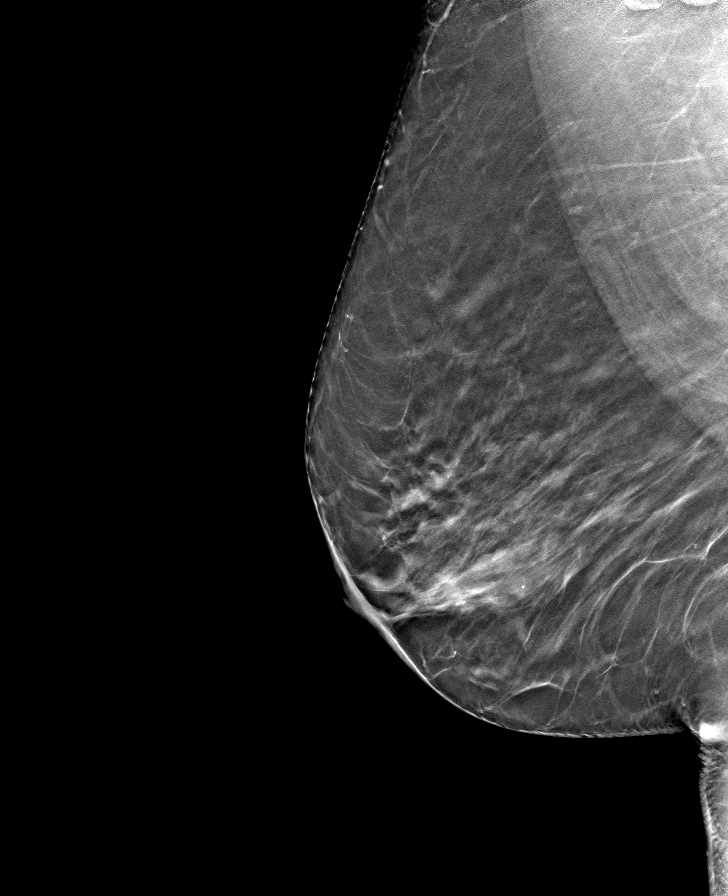

[L CC tomo · tomo slice 31/61.0]
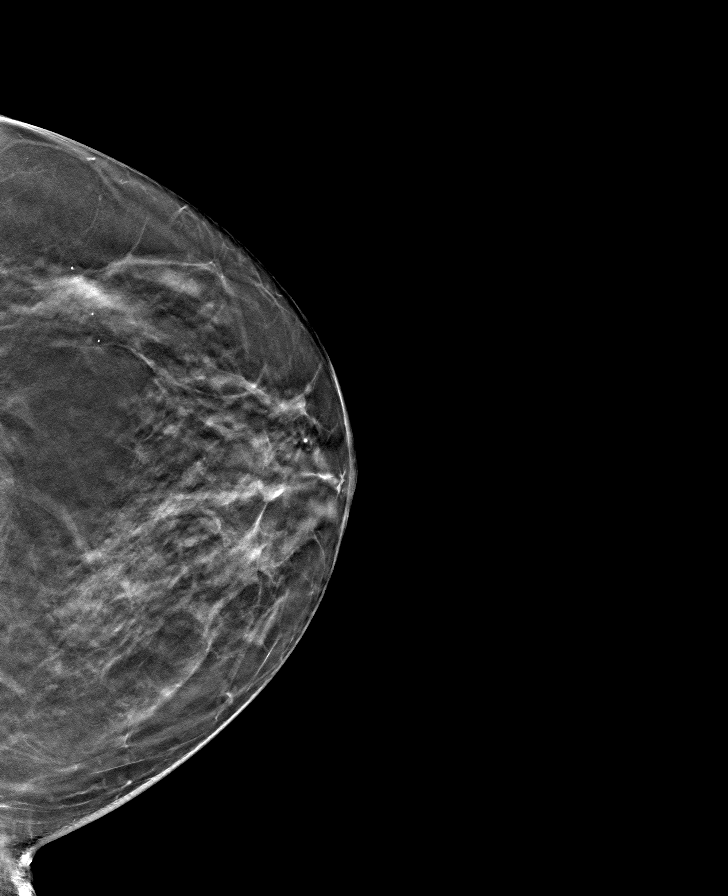

[R CC tomo · tomo slice 30/59.0]
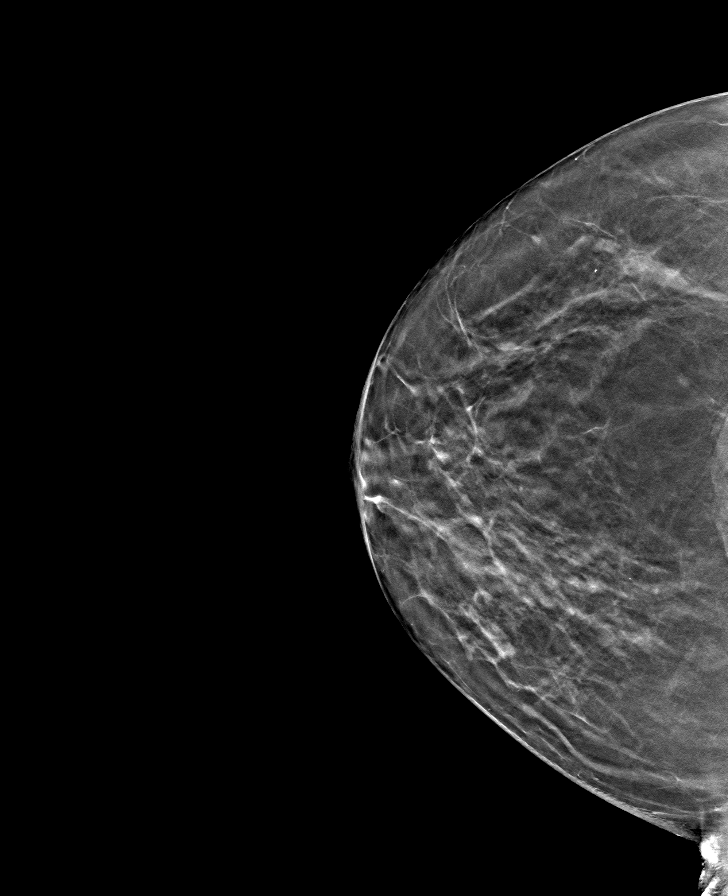

[8 of 24 positions shown; findings below may reference images not displayed]

ACR Breast Density Category b: There are scattered areas of
fibroglandular density.
FINDINGS: There are no findings suspicious for malignancy. Images were
processed with CAD.
IMPRESSION: No mammographic evidence of malignancy. A result letter of this
screening mammogram will be mailed directly to the patient.

RECOMMENDATION:
Screening mammogram in one year. (Code:[id number])

BI-RADS CATEGORY  1: Negative.

## 2019-01-24 ENCOUNTER — Ambulatory Visit
Admission: RE | Admit: 2019-01-24 | Discharge: 2019-01-24 | Disposition: A | Discharge status: Home / Self Care | Payer: 59 | Source: Ambulatory Visit | Attending: Family Medicine | Admitting: Family Medicine

## 2019-01-24 DIAGNOSIS — Z1231 Encounter for screening mammogram for malignant neoplasm of breast: Secondary | ICD-10-CM | POA: Diagnosis present

## 2019-03-26 ENCOUNTER — Other Ambulatory Visit: Payer: Self-pay

## 2019-03-26 ENCOUNTER — Encounter: Payer: Self-pay | Admitting: Emergency Medicine

## 2019-03-26 ENCOUNTER — Ambulatory Visit: Payer: 59 | Attending: Internal Medicine

## 2019-03-26 ENCOUNTER — Emergency Department: Payer: 59

## 2019-03-26 ENCOUNTER — Emergency Department
Admission: EM | Admit: 2019-03-26 | Discharge: 2019-03-26 | Disposition: A | Discharge status: Home / Self Care | Payer: 59 | Attending: Emergency Medicine | Admitting: Emergency Medicine

## 2019-03-26 DIAGNOSIS — Z79899 Other long term (current) drug therapy: Secondary | ICD-10-CM | POA: Diagnosis not present

## 2019-03-26 DIAGNOSIS — R0789 Other chest pain: Secondary | ICD-10-CM | POA: Diagnosis present

## 2019-03-26 DIAGNOSIS — E039 Hypothyroidism, unspecified: Secondary | ICD-10-CM | POA: Insufficient documentation

## 2019-03-26 DIAGNOSIS — F17210 Nicotine dependence, cigarettes, uncomplicated: Secondary | ICD-10-CM | POA: Insufficient documentation

## 2019-03-26 DIAGNOSIS — R079 Chest pain, unspecified: Secondary | ICD-10-CM

## 2019-03-26 DIAGNOSIS — I10 Essential (primary) hypertension: Secondary | ICD-10-CM | POA: Insufficient documentation

## 2019-03-26 DIAGNOSIS — Z23 Encounter for immunization: Secondary | ICD-10-CM | POA: Insufficient documentation

## 2019-03-26 LAB — CBC
HCT: 43.5 % (ref 36.0–46.0)
Hemoglobin: 14.1 g/dL (ref 12.0–15.0)
MCH: 29.6 pg (ref 26.0–34.0)
MCHC: 32.4 g/dL (ref 30.0–36.0)
MCV: 91.4 fL (ref 80.0–100.0)
Platelets: 273 10*3/uL (ref 150–400)
RBC: 4.76 MIL/uL (ref 3.87–5.11)
RDW: 14 % (ref 11.5–15.5)
WBC: 12.8 10*3/uL — ABNORMAL HIGH (ref 4.0–10.5)
nRBC: 0 % (ref 0.0–0.2)

## 2019-03-26 LAB — BASIC METABOLIC PANEL
Anion gap: 9 (ref 5–15)
BUN: 14 mg/dL (ref 8–23)
CO2: 27 mmol/L (ref 22–32)
Calcium: 9.3 mg/dL (ref 8.9–10.3)
Chloride: 101 mmol/L (ref 98–111)
Creatinine, Ser: 0.81 mg/dL (ref 0.44–1.00)
GFR calc Af Amer: >60 mL/min (ref >60)
GFR calc non Af Amer: >60 mL/min (ref >60)
Glucose, Bld: 127 mg/dL — ABNORMAL HIGH (ref 70–99)
Potassium: 3.6 mmol/L (ref 3.5–5.1)
Sodium: 137 mmol/L (ref 135–145)

## 2019-03-26 LAB — TROPONIN I (HIGH SENSITIVITY)
Troponin I (High Sensitivity): 2 ng/L (ref <18)
Troponin I (High Sensitivity): <2 ng/L (ref <18)

## 2019-03-26 MED ORDER — SODIUM CHLORIDE 0.9% FLUSH: 3.0000 mL | Freq: Once | INTRAVENOUS | Status: DC

## 2019-03-26 NOTE — ED Triage Notes (Signed)
States had first COVID vaccine shot today at around 1000.  While back at home, working, began to fell left sided chest pressure.  Arrives to ED with no current c/o chest pressure.  Patient states pressure resolved while driving to the ED.  AAOx3.  Skin warm and dry. No SOB/ DOE.  NAD

## 2019-03-26 NOTE — ED Provider Notes (Signed)
[facility] Emergency Department Provider Note   ____________________________________________    I have reviewed the triage vital signs and the nursing notes.   HISTORY  Chief Complaint Chest Pain     HPI Felicia Manning is a 64 y.o. female who presents with complaints of chest pain.  Patient reports she received first dose of coronavirus vaccine this morning at 10 AM.  She was doing well afterwards and not having any issues.  Around 2 PM she was working from home and she developed a significant pressure in her chest.  She has a history of heartburn but this felt more significant.  Denied shortness of breath.  No pleurisy.  She has never had this before.  No history of heart disease.  Symptoms resolved on the way to the emergency department.  Currently she feels well and has no complaints.  No recent travel.  No calf pain or swelling.  Past Medical History:  Diagnosis Date  . Barrett's esophagus   . Dysrhythmia    stress related at work.  Marland Kitchen GERD (gastroesophageal reflux disease)   . Hepatitis C 2008  . Hyperlipidemia   . Hypertension   . Hypothyroidism   . Nephrolithiasis   . Osteoporosis   . Thyroid cancer ([place]) 2012  . Thyroid cancer [facility])     Patient Active Problem List   Diagnosis Date Noted  . Sepsis ([place]) 08/13/2017  . Acute diverticulitis 06/18/2017  . 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 Fungal infection of toenail 06/11/2015  . 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 Barrett esophagus 05/02/2015  . Acid reflux 05/02/2015  . HCV (hepatitis C virus) 05/02/2015  . HLD (hyperlipidemia) 05/02/2015  . BP (high blood pressure) 05/02/2015  . Adult hypothyroidism 05/02/2015  . Elevated WBC count 05/02/2015  . 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[place]) 05/02/2015  . Kidney stone   . Pyelonephritis   . UTI (lower urinary tract infection) 04/08/2015  . Flank pain 04/08/2015  . Chronic hepatitis C virus infection ([place])  03/12/2015  . Essential (primary) hypertension 03/12/2015  . Gastro-esophageal reflux disease without esophagitis 03/12/2015  . Hypothyroidism, postop 03/12/2015  . H/O malignant neoplasm of thyroid 01/29/2014  . OP (osteoporosis) 10/06/2010  . Current tobacco use 10/06/2010  . Tobacco use 10/06/2010    Past Surgical History:  Procedure Laterality Date  . ABDOMINAL HYSTERECTOMY  2006  . COLONOSCOPY WITH PROPOFOL N/A 04/25/2015   Procedure: COLONOSCOPY WITH PROPOFOL;  Surgeon: Josefine Class, MD;  Location: [facility] ENDOSCOPY;  Service: Endoscopy;  Laterality: N/A;  . CYSTOSCOPY W/ RETROGRADES Bilateral 05/19/2015   Procedure: CYSTOSCOPY WITH RETROGRADE PYELOGRAM;  Surgeon: Hollice Espy, MD;  Location: [facility] ORS;  Service: Urology;  Laterality: Bilateral;  . CYSTOSCOPY W/ URETERAL STENT PLACEMENT Right 05/26/2015   Procedure: CYSTOSCOPY WITH STENT REPLACEMENT;  Surgeon: Hollice Espy, MD;  Location: [facility] ORS;  Service: Urology;  Laterality: Right;  . CYSTOSCOPY WITH STENT PLACEMENT Right 05/19/2015   Procedure: CYSTOSCOPY WITH STENT PLACEMENT;  Surgeon: Hollice Espy, MD;  Location: [facility] ORS;  Service: Urology;  Laterality: Right;  . CYSTOSCOPY WITH STENT PLACEMENT Right 05/26/2015   Procedure: CYSTOSCOPY WITH STENT PLACEMENT;  Surgeon: Hollice Espy, MD;  Location: [facility] ORS;  Service: Urology;  Laterality: Right;  . CYSTOSCOPY WITH STENT PLACEMENT Right 06/25/2016   Procedure: CYSTOSCOPY WITH STENT PLACEMENT;  Surgeon: Nickie Retort, MD;  Location: [facility] ORS;  Service: Urology;  Laterality: Right;  . CYSTOSCOPY WITH STENT PLACEMENT Left 10/31/2017   Procedure: CYSTOSCOPY WITH STENT  PLACEMENT;  Surgeon: Hollice Espy, MD;  Location: [facility] ORS;  Service: Urology;  Laterality: Left;  . CYSTOSCOPY/URETEROSCOPY/HOLMIUM LASER/STENT PLACEMENT Left 07/23/2015   Procedure: CYSTOSCOPY/URETEROSCOPY/HOLMIUM LASER/STENT PLACEMENT/RETROGRADE PYELOGRAM;  Surgeon: Hollice Espy, MD;  Location: [facility] ORS;   Service: Urology;  Laterality: Left;  . CYSTOSCOPY/URETEROSCOPY/HOLMIUM LASER/STENT PLACEMENT Left 11/16/2017   Procedure: CYSTOSCOPY/URETEROSCOPY/HOLMIUM LASER/STENT Exchange;  Surgeon: Hollice Espy, MD;  Location: [facility] ORS;  Service: Urology;  Laterality: Left;  . ESOPHAGOGASTRODUODENOSCOPY (EGD) WITH PROPOFOL N/A 04/25/2015   Procedure: ESOPHAGOGASTRODUODENOSCOPY (EGD) WITH PROPOFOL;  Surgeon: Josefine Class, MD;  Location: [facility] ENDOSCOPY;  Service: Endoscopy;  Laterality: N/A;  . EYE SURGERY Bilateral 1964   lazy eye repair  . EYE SURGERY Bilateral 2001   lazy eye repair  . LITHOTRIPSY  2009  . THYROIDECTOMY  2010  . URETEROSCOPY WITH HOLMIUM LASER LITHOTRIPSY Right 05/19/2015   Procedure: URETEROSCOPY WITH HOLMIUM LASER LITHOTRIPSY;  Surgeon: Hollice Espy, MD;  Location: [facility] ORS;  Service: Urology;  Laterality: Right;  . URETEROSCOPY WITH HOLMIUM LASER LITHOTRIPSY Right 05/26/2015   Procedure: URETEROSCOPY WITH HOLMIUM LASER LITHOTRIPSY;  Surgeon: Hollice Espy, MD;  Location: [facility] ORS;  Service: Urology;  Laterality: Right;  . URETEROSCOPY WITH HOLMIUM LASER LITHOTRIPSY Right 06/25/2016   Procedure: URETEROSCOPY WITH HOLMIUM LASER LITHOTRIPSY;  Surgeon: Nickie Retort, MD;  Location: [facility] ORS;  Service: Urology;  Laterality: Right;    Prior to Admission medications   Medication Sig Start Date End Date Taking? Authorizing Provider  amLODipine (NORVASC) 5 MG tablet Take 5 mg by mouth every morning.     [provider]  cholecalciferol (VITAMIN D) 1000 units tablet Take 2,000 Units by mouth daily.    [provider]  conjugated estrogens (PREMARIN) vaginal cream Place 1 Applicatorful vaginally daily. Use pea sized amount M-W-Fr before bedtime 12/14/17   Hollice Espy, MD  Cranberry-Vitamin C-Probiotic (AZO CRANBERRY PO) Take 1 tablet by mouth daily.    [provider]  levothyroxine (SYNTHROID, LEVOTHROID) 150 MCG tablet Take 150 mcg by mouth at  bedtime.     [provider]  losartan (COZAAR) 100 MG tablet Take 100 mg by mouth every morning.     [provider]  metoprolol succinate (TOPROL-XL) 25 MG 24 hr tablet Take 25 mg by mouth every morning.     [provider]  omeprazole (PRILOSEC) 20 MG capsule Take 20 mg by mouth daily before breakfast.     [provider]     Allergies Patient has no known allergies.  Family History  Problem Relation Age of Onset  . CAD Mother   . Heart disease Mother   . Dementia Father   . Bladder Cancer Neg Hx   . Prostate cancer Neg Hx   . Kidney cancer Neg Hx   . Breast cancer Neg Hx     Social History Social History   Tobacco Use  . Smoking status: Current Every Day Smoker    Packs/day: 1.00    Years: 40.00    Pack years: 40.00    Types: Cigarettes  . Smokeless tobacco: Never Used  Substance Use Topics  . Alcohol use: Yes    Comment: socially  . Drug use: No    Review of Systems  Constitutional: No fever/chills Eyes: No visual changes.  ENT: No sore throat. Cardiovascular: As above Respiratory: Denies shortness of breath. Gastrointestinal: No abdominal pain.  No nausea, no vomiting.   Genitourinary: Negative for dysuria. Musculoskeletal: Negative for back pain. Skin: Negative for rash. Neurological: Negative  for headaches    ____________________________________________   PHYSICAL EXAM:  VITAL SIGNS: ED Triage Vitals  Enc Vitals Group     BP 03/26/19 1512 (!) 142/71     Pulse --      Resp 03/26/19 1512 16     Temp 03/26/19 1512 (!) 97.5 F (36.4 C)     Temp Source 03/26/19 1512 Oral     SpO2 03/26/19 1512 97 %     Weight 03/26/19 1504 86.2 kg (190 lb 0.6 oz)     Height --      Head Circumference --      Peak Flow --      Pain Score 03/26/19 1504 0     Pain Loc --      Pain Edu? --      Excl. in [place]? --     Constitutional: Alert and oriented.   Nose: No congestion/rhinnorhea. Mouth/Throat: Mucous membranes are  moist.    Cardiovascular: Normal rate, regular rhythm. Grossly normal heart sounds.  Good peripheral circulation. Respiratory: Normal respiratory effort.  No retractions. Lungs CTAB. Gastrointestinal: Soft and nontender. No distention.  No CVA tenderness.  Musculoskeletal: No lower extremity tenderness nor edema.  Warm and well perfused Neurologic:  Normal speech and language. No gross focal neurologic deficits are appreciated.  Skin:  Skin is warm, dry and intact. No rash noted. Psychiatric: Mood and affect are normal. Speech and behavior are normal.  ____________________________________________   LABS (all labs ordered are listed, but only abnormal results are displayed)  Labs Reviewed  BASIC METABOLIC PANEL - Abnormal; Notable for the following components:      Result Value   Glucose, Bld 127 (*)    All other components within normal limits  CBC - Abnormal; Notable for the following components:   WBC 12.8 (*)    All other components within normal limits  TROPONIN I (HIGH SENSITIVITY)  TROPONIN I (HIGH SENSITIVITY)   ____________________________________________  EKG  ED ECG REPORT I, Lavonia Drafts, the attending physician, personally viewed and interpreted this ECG.  Date: 03/26/2019  Rhythm: normal sinus rhythm QRS Axis: normal Intervals: normal ST/T Wave abnormalities: normal Narrative Interpretation: no evidence of acute ischemia  ____________________________________________  RADIOLOGY  Chest x-ray unremarkable ____________________________________________   PROCEDURES  Procedure(s) performed: No  Procedures   Critical Care performed: No ____________________________________________   INITIAL IMPRESSION / ASSESSMENT AND PLAN / ED COURSE  Pertinent labs & imaging results that were available during my care of the patient were reviewed by me and considered in my medical decision making (see chart for details).  Patient presents after chest pressure as  described above.  Differential includes ACS although EKG and initial troponin are reassuring, doubt vaccine reaction is no other allergic type symptoms.  Heartburn is on the differential as well.  We will obtain second troponin as the patient is entirely chest pain-free at this time, EKG is reassuring.  Second troponin is normal, patient remains pain-free.  I will have the patient follow-up with cardiology, strict return precautions    ____________________________________________   FINAL CLINICAL IMPRESSION(S) / ED DIAGNOSES  Final diagnoses:  Nonspecific chest pain        Note:  This document was prepared using Dragon voice recognition software and may include unintentional dictation errors.   Lavonia Drafts, MD 03/26/19 [phone]

## 2019-03-26 NOTE — Progress Notes (Signed)
   Covid-19 Vaccination Clinic  Name:  Willye Javier    MRN: [id number] DOB: 1955-02-26  03/26/2019  Ms. Reever was observed post Covid-19 immunization for 15 minutes without incidence. She was provided with Vaccine Information Sheet and instruction to access the V-Safe system.   Ms. Sommerfeld was instructed to call 911 with any severe reactions post vaccine: Marland Kitchen Difficulty breathing  . Swelling of your face and throat  . A fast heartbeat  . A bad rash all over your body  . Dizziness and weakness    Immunizations Administered    Name Date Dose VIS Date Route   Pfizer COVID-19 Vaccine 03/26/2019  9:58 AM 0.3 mL 01/26/2019 Intramuscular   Manufacturer: [place]   Lot: [id number]   [place]: [id number]

## 2019-03-29 ENCOUNTER — Encounter: Payer: Self-pay | Admitting: Cardiovascular Disease

## 2019-03-29 ENCOUNTER — Other Ambulatory Visit: Payer: Self-pay

## 2019-03-29 ENCOUNTER — Ambulatory Visit (INDEPENDENT_AMBULATORY_CARE_PROVIDER_SITE_OTHER): Payer: 59 | Admitting: Cardiovascular Disease

## 2019-03-29 VITALS — BP 132/70 | HR 69 | Ht 66.0 in | Wt 197.5 lb

## 2019-03-29 DIAGNOSIS — R072 Precordial pain: Secondary | ICD-10-CM

## 2019-03-29 DIAGNOSIS — I700 Atherosclerosis of aorta: Secondary | ICD-10-CM

## 2019-03-29 DIAGNOSIS — I10 Essential (primary) hypertension: Secondary | ICD-10-CM | POA: Diagnosis not present

## 2019-03-29 DIAGNOSIS — E785 Hyperlipidemia, unspecified: Secondary | ICD-10-CM

## 2019-03-29 DIAGNOSIS — Z720 Tobacco use: Secondary | ICD-10-CM | POA: Diagnosis not present

## 2019-03-29 NOTE — Patient Instructions (Signed)
Medication Instructions:  Your physician recommends that you continue on your current medications as directed. Please refer to the Current Medication list given to you today.  *If you need a refill on your cardiac medications before your next appointment, please call your pharmacy*  Lab Work: You will need lab work prior to your Cardiac CTA. Please have your labs drawn at the [facility] [place] medical [facility]. You do not need an appointment. Their hours are Mon-Fri 7am-6pm.  If you have labs (blood work) drawn today and your tests are completely normal, you will receive your results only by: Marland Kitchen MyChart Message (if you have MyChart) OR . A paper copy in the mail If you have any lab test that is abnormal or we need to change your treatment, we will call you to review the results.  Testing/Procedures: Your physician has requested that you have cardiac CT. Cardiac computed tomography (CT) is a painless test that uses an x-ray machine to take clear, detailed pictures of your heart. For further information please visit [email or web address]. Please follow instruction sheet as given.     Follow-Up: At [facility], you and your health needs are our priority.  As part of our continuing mission to provide you with exceptional heart care, we have created designated Provider Care Teams.  These Care Teams include your primary Cardiologist (physician) and Advanced Practice Providers (APPs -  Physician Assistants and Nurse Practitioners) who all work together to provide you with the care you need, when you need it.  Your next appointment:   As needed   The format for your next appointment:   In Person  Provider:    You may see  Dr. Fletcher Anon or one of the following Advanced Practice Providers on your designated Care Team:    Murray Hodgkins, NP  Christell Faith, PA-C  Marrianne Mood, PA-C   Other Instructions Your cardiac CT will be scheduled at one of the below locations:   [facility] [address] [place], [place] [address] [phone]  [place] [address] [place], [place] [address] [phone]  If scheduled at [facility], please arrive at the [facility] main entrance of [facility] 30 minutes prior to test start time. Proceed to the [facility] Radiology Department (first floor) to check-in and test prep.  If scheduled at [facility], please arrive 15 mins early for check-in and test prep.  Please follow these instructions carefully (unless otherwise directed):   On the Night Before the Test: . Be sure to Drink plenty of water. . Do not consume any caffeinated/decaffeinated beverages or chocolate 12 hours prior to your test. . Do not take any antihistamines 12 hours prior to your test.  On the Day of the Test: . Drink plenty of water. Do not drink any water within one hour of the test. . Do not eat any food 4 hours prior to the test. . You may take your regular medications prior to the test.  . Take metoprolol two hours prior to test.t. . FEMALES- please wear underwire-free bra if available        After the Test: . Drink plenty of water. . After receiving IV contrast, you may experience a mild flushed feeling. This is normal. . On occasion, you may experience a mild rash up to 24 hours after the test. This is not dangerous. If this occurs, you can take Benadryl 25 mg and increase your fluid  intake. . If you experience trouble breathing, this can be serious. If it is severe call 911 IMMEDIATELY. If it is mild, please call our office. . If you take any of these medications: Glipizide/Metformin, Avandament, Glucavance, please do not take 48 hours after completing test unless otherwise instructed.   Once we have confirmed authorization from your insurance company, we will call you to set up a date and time for your test.   For non-scheduling related questions,  please contact the cardiac imaging nurse navigator should you have any questions/concerns: [name], RN Navigator Cardiac Imaging [facility] [phone] mobile

## 2019-03-29 NOTE — Progress Notes (Signed)
Cardiology Office Note   Date:  03/29/2019   ID:  Felicia Manning, DOB 1955-04-23, MRN [id number]  PCP:  Dion Body, MD  Cardiologist:   Kathlyn Sacramento, MD   Chief Complaint  Patient presents with  . New Patient (Initial Visit)    ED f/u/ chest pain/ dizziness/ SOB/ swelling in ankles and feet. Meds verbally reviewed w/ pt.      History of Present Illness: Felicia Manning is a 64 y.o. female who was referred by Dr. Corky Downs for evaluation of chest pain.  She has no prior cardiac history but does report having palpitations about 10 years ago and was treated with metoprolol with no recurrent symptoms since then.  She has multiple chronic medical conditions including essential hypertension, hyperlipidemia, tobacco use and borderline diabetes.  She smokes 1 pack/day.  She does have known history of GERD and hiatal hernia.  She had hepatitis C that was successfully treated.  She does have family history of coronary artery disease as her mother had myocardial infarction and PCI. She received COVID-19 vaccine on Monday around 10:00 in the morning.  She went home and work from home.  She was sitting behind a desk and started having left-sided chest pain described as squeezing discomfort which was intense and lasted for about 30 to 40 minutes.  She felt short of breath but no other associated symptoms.  Given intensity and duration of the pain, she went to the emergency room at [facility].  High-sensitivity troponin was normal x2.  EKG showed no acute changes.  She reports no previous similar episodes and she had no recurrent symptoms since then.    Past Medical History:  Diagnosis Date  . Barrett's esophagus   . Dysrhythmia    stress related at work.  Marland Kitchen GERD (gastroesophageal reflux disease)   . Hepatitis C 2008  . Hyperlipidemia   . Hypertension   . Hypothyroidism   . Nephrolithiasis   . Osteoporosis   . Thyroid cancer ([place]) 2012  . Thyroid cancer [facility])     Past Surgical History:   Procedure Laterality Date  . ABDOMINAL HYSTERECTOMY  2006  . COLONOSCOPY WITH PROPOFOL N/A 04/25/2015   Procedure: COLONOSCOPY WITH PROPOFOL;  Surgeon: Josefine Class, MD;  Location: [facility] ENDOSCOPY;  Service: Endoscopy;  Laterality: N/A;  . CYSTOSCOPY W/ RETROGRADES Bilateral 05/19/2015   Procedure: CYSTOSCOPY WITH RETROGRADE PYELOGRAM;  Surgeon: Hollice Espy, MD;  Location: [facility] ORS;  Service: Urology;  Laterality: Bilateral;  . CYSTOSCOPY W/ URETERAL STENT PLACEMENT Right 05/26/2015   Procedure: CYSTOSCOPY WITH STENT REPLACEMENT;  Surgeon: Hollice Espy, MD;  Location: [facility] ORS;  Service: Urology;  Laterality: Right;  . CYSTOSCOPY WITH STENT PLACEMENT Right 05/19/2015   Procedure: CYSTOSCOPY WITH STENT PLACEMENT;  Surgeon: Hollice Espy, MD;  Location: [facility] ORS;  Service: Urology;  Laterality: Right;  . CYSTOSCOPY WITH STENT PLACEMENT Right 05/26/2015   Procedure: CYSTOSCOPY WITH STENT PLACEMENT;  Surgeon: Hollice Espy, MD;  Location: [facility] ORS;  Service: Urology;  Laterality: Right;  . CYSTOSCOPY WITH STENT PLACEMENT Right 06/25/2016   Procedure: CYSTOSCOPY WITH STENT PLACEMENT;  Surgeon: Nickie Retort, MD;  Location: [facility] ORS;  Service: Urology;  Laterality: Right;  . CYSTOSCOPY WITH STENT PLACEMENT Left 10/31/2017   Procedure: CYSTOSCOPY WITH STENT PLACEMENT;  Surgeon: Hollice Espy, MD;  Location: [facility] ORS;  Service: Urology;  Laterality: Left;  . CYSTOSCOPY/URETEROSCOPY/HOLMIUM LASER/STENT PLACEMENT Left 07/23/2015   Procedure: CYSTOSCOPY/URETEROSCOPY/HOLMIUM LASER/STENT PLACEMENT/RETROGRADE PYELOGRAM;  Surgeon: Hollice Espy, MD;  Location: [facility] ORS;  Service:  Urology;  Laterality: Left;  . CYSTOSCOPY/URETEROSCOPY/HOLMIUM LASER/STENT PLACEMENT Left 11/16/2017   Procedure: CYSTOSCOPY/URETEROSCOPY/HOLMIUM LASER/STENT Exchange;  Surgeon: Hollice Espy, MD;  Location: [facility] ORS;  Service: Urology;  Laterality: Left;  . ESOPHAGOGASTRODUODENOSCOPY (EGD) WITH PROPOFOL N/A 04/25/2015    Procedure: ESOPHAGOGASTRODUODENOSCOPY (EGD) WITH PROPOFOL;  Surgeon: Josefine Class, MD;  Location: [facility] ENDOSCOPY;  Service: Endoscopy;  Laterality: N/A;  . EYE SURGERY Bilateral 1964   lazy eye repair  . EYE SURGERY Bilateral 2001   lazy eye repair  . LITHOTRIPSY  2009  . THYROIDECTOMY  2010  . URETEROSCOPY WITH HOLMIUM LASER LITHOTRIPSY Right 05/19/2015   Procedure: URETEROSCOPY WITH HOLMIUM LASER LITHOTRIPSY;  Surgeon: Hollice Espy, MD;  Location: [facility] ORS;  Service: Urology;  Laterality: Right;  . URETEROSCOPY WITH HOLMIUM LASER LITHOTRIPSY Right 05/26/2015   Procedure: URETEROSCOPY WITH HOLMIUM LASER LITHOTRIPSY;  Surgeon: Hollice Espy, MD;  Location: [facility] ORS;  Service: Urology;  Laterality: Right;  . URETEROSCOPY WITH HOLMIUM LASER LITHOTRIPSY Right 06/25/2016   Procedure: URETEROSCOPY WITH HOLMIUM LASER LITHOTRIPSY;  Surgeon: Nickie Retort, MD;  Location: [facility] ORS;  Service: Urology;  Laterality: Right;     Current Outpatient Medications  Medication Sig Dispense Refill  . amLODipine (NORVASC) 5 MG tablet Take 5 mg by mouth every morning.     Marland Kitchen buPROPion (WELLBUTRIN XL) 150 MG 24 hr tablet Take 150 mg by mouth daily.    . cholecalciferol (VITAMIN D) 1000 units tablet Take 2,000 Units by mouth daily.    Marland Kitchen conjugated estrogens (PREMARIN) vaginal cream Place 1 Applicatorful vaginally daily. Use pea sized amount M-W-Fr before bedtime 42.5 g 12  . Cranberry-Vitamin C-Probiotic (AZO CRANBERRY PO) Take 1 tablet by mouth daily.    Marland Kitchen levothyroxine (SYNTHROID, LEVOTHROID) 150 MCG tablet Take 150 mcg by mouth at bedtime.     Marland Kitchen losartan (COZAAR) 100 MG tablet Take 100 mg by mouth every morning.     . lovastatin (MEVACOR) 40 MG tablet Take 40 mg by mouth at bedtime.    . metoprolol succinate (TOPROL-XL) 25 MG 24 hr tablet Take 25 mg by mouth every morning.   1  . omeprazole (PRILOSEC) 20 MG capsule Take 20 mg by mouth daily before breakfast.      No current facility-administered  medications for this visit.    Allergies:   Patient has no known allergies.    Social History:  The patient  reports that she has been smoking cigarettes. She has a 40.00 pack-year smoking history. She has never used smokeless tobacco. She reports current alcohol use. She reports that she does not use drugs.   Family History:  The patient's family history includes CAD in her mother; Dementia in her father; Heart disease in her mother.    ROS:  Please see the history of present illness.   Otherwise, review of systems are positive for none.   All other systems are reviewed and negative.    PHYSICAL EXAM: VS:  BP 132/70 (BP Location: Right Arm, Patient Position: Sitting, Cuff Size: Normal)   Pulse 69   Ht 5\' 6"  (1.676 m)   Wt 197 lb 8 oz (89.6 kg)   SpO2 98%   BMI 31.88 kg/m  , BMI Body mass index is 31.88 kg/m. GEN: Well nourished, well developed, in no acute distress  HEENT: normal  Neck: no JVD, carotid bruits, or masses Cardiac: RRR; no murmurs, rubs, or gallops,no edema  Respiratory:  clear to auscultation bilaterally, normal work of breathing GI: soft, nontender, nondistended, +  BS MS: no deformity or atrophy  Skin: warm and dry, no rash Neuro:  Strength and sensation are intact Psych: euthymic mood, full affect   EKG:  EKG is ordered today. The ekg ordered today demonstrates normal sinus rhythm with no significant ST or T wave changes.     Recent Labs: 03/26/2019: BUN 14; Creatinine, Ser 0.81; Hemoglobin 14.1; Platelets 273; Potassium 3.6; Sodium 137    Lipid Panel No results found for: CHOL, TRIG, HDL, CHOLHDL, VLDL, LDLCALC, LDLDIRECT    Wt Readings from Last 3 Encounters:  03/29/19 197 lb 8 oz (89.6 kg)  03/26/19 190 lb 0.6 oz (86.2 kg)  10/05/18 190 lb (86.2 kg)       PAD Screen 03/29/2019  Previous PAD dx? No  Previous surgical procedure? No  Pain with walking? No  Feet/toe relief with dangling? No  Painful, non-healing ulcers? No  Extremities  discolored? No      ASSESSMENT AND PLAN:  1.  Chest pain with multiple risk factors for coronary artery disease.  Fortunately, her recent troponin was normal and EKG does not show any ischemic changes.  I discussed different options for diagnosis of possible underlying ischemic heart disease.  I think the best option is to proceed with CTA of the coronary arteries plus FFR if needed.  This does not only give [place] information about obstructive coronary artery disease but also about the burden of atherosclerosis in order to guide treatment.  2.  Essential hypertension: Blood pressures controlled on current medications.  3.  Hyperlipidemia: Currently on lovastatin.  If CTA shows significant coronary atherosclerosis, we will need to be more aggressive with this.  4.  Tobacco use: I discussed the importance of smoking cessation and she already started using Wellbutrin with the intention to quit.  5.  Aortic atherosclerosis: Noted on previous CT scan of the abdomen.  She has no symptoms of claudication.    Disposition:   FU with me as needed  Signed,  Kathlyn Sacramento, MD  03/29/2019 2:08 PM    [facility]

## 2019-04-18 ENCOUNTER — Ambulatory Visit: Payer: 59 | Attending: Internal Medicine

## 2019-04-18 DIAGNOSIS — Z23 Encounter for immunization: Secondary | ICD-10-CM | POA: Insufficient documentation

## 2019-04-18 NOTE — Progress Notes (Signed)
   Covid-19 Vaccination Clinic  Name:  Noble Bodie    MRN: [id number] DOB: 1955/03/16  04/18/2019  Ms. Caldas was observed post Covid-19 immunization for 15 minutes without incident. She was provided with Vaccine Information Sheet and instruction to access the V-Safe system.   Ms. Gorton was instructed to call 911 with any severe reactions post vaccine: Marland Kitchen Difficulty breathing  . Swelling of face and throat  . A fast heartbeat  . A bad rash all over body  . Dizziness and weakness   Immunizations Administered    Name Date Dose VIS Date Route   Pfizer COVID-19 Vaccine 04/18/2019 10:04 AM 0.3 mL 01/26/2019 Intramuscular   Manufacturer: [place]   Lot: [id number]   [place]: [id number]

## 2019-05-11 ENCOUNTER — Other Ambulatory Visit
Admission: RE | Admit: 2019-05-11 | Discharge: 2019-05-11 | Disposition: A | Discharge status: Home / Self Care | Payer: 59 | Source: Ambulatory Visit | Attending: Cardiovascular Disease | Admitting: Cardiovascular Disease

## 2019-05-11 DIAGNOSIS — R072 Precordial pain: Secondary | ICD-10-CM | POA: Diagnosis present

## 2019-05-11 LAB — BASIC METABOLIC PANEL
Anion gap: 9 (ref 5–15)
BUN: 12 mg/dL (ref 8–23)
CO2: 27 mmol/L (ref 22–32)
Calcium: 8.8 mg/dL — ABNORMAL LOW (ref 8.9–10.3)
Chloride: 107 mmol/L (ref 98–111)
Creatinine, Ser: 0.67 mg/dL (ref 0.44–1.00)
GFR calc Af Amer: >60 mL/min (ref >60)
GFR calc non Af Amer: >60 mL/min (ref >60)
Glucose, Bld: 113 mg/dL — ABNORMAL HIGH (ref 70–99)
Potassium: 3.6 mmol/L (ref 3.5–5.1)
Sodium: 143 mmol/L (ref 135–145)

## 2019-05-15 ENCOUNTER — Encounter (HOSPITAL_COMMUNITY): Payer: Self-pay

## 2019-05-16 ENCOUNTER — Telehealth (HOSPITAL_COMMUNITY): Payer: Self-pay | Admitting: Emergency Medicine

## 2019-05-16 NOTE — Telephone Encounter (Signed)
Reaching out to patient to offer assistance regarding upcoming cardiac imaging study; pt verbalizes understanding of appt date/time, parking situation and where to check in, pre-test NPO status and medications ordered, and verified current allergies; name and call back number provided for further questions should they arise Felicia Bond RN Navigator Cardiac Imaging [place] [facility] [phone] office [phone] cell   Clarified medications to take/not take prior to scan. Asked patient to take 2 metoprolol tablets and HOLD other BP meds; pt verbalized understanding Felicia Manning

## 2019-05-17 ENCOUNTER — Ambulatory Visit
Admission: RE | Admit: 2019-05-17 | Discharge: 2019-05-17 | Disposition: A | Discharge status: Home / Self Care | Payer: 59 | Source: Ambulatory Visit | Attending: Cardiovascular Disease | Admitting: Cardiovascular Disease

## 2019-05-17 ENCOUNTER — Other Ambulatory Visit: Payer: Self-pay

## 2019-05-17 ENCOUNTER — Telehealth: Payer: Self-pay | Admitting: Cardiovascular Disease

## 2019-05-17 DIAGNOSIS — R072 Precordial pain: Secondary | ICD-10-CM | POA: Diagnosis present

## 2019-05-17 MED ORDER — METOPROLOL TARTRATE 5 MG/5ML IV SOLN
10.0000 mg | Freq: Once | INTRAVENOUS | Status: AC
  Administered 2019-05-17: 10:00:00 10 mg via INTRAVENOUS

## 2019-05-17 MED ORDER — NITROGLYCERIN 0.4 MG SL SUBL
0.8000 mg | SUBLINGUAL_TABLET | Freq: Once | SUBLINGUAL | Status: AC
  Administered 2019-05-17: 10:00:00 0.8 mg via SUBLINGUAL

## 2019-05-17 MED ORDER — IOHEXOL 350 MG/ML SOLN
100.0000 mL | Freq: Once | INTRAVENOUS | Status: AC | PRN
  Administered 2019-05-17: 09:00:00 100 mL via INTRAVENOUS

## 2019-05-17 NOTE — Telephone Encounter (Signed)
Radiology calling with results of CT  Transferred to [facility]

## 2019-05-17 NOTE — Progress Notes (Signed)
Patient tolerated CT well. Drank coffee after. Ambulated steady gait to exit.

## 2019-05-17 NOTE — Telephone Encounter (Addendum)
Incoming triage call received.  Olivia Mackie from [facility] called to give the Radiologist over read from the patients Cardiac CTA performed today.  IMPRESSION: 1. 9 mm noncalcified left lung base nodule is identified and there is a 1 cm right middle lobe ground-glass attenuating nodule. Recommend further evaluation with dedicated CT of the chest with contrast material.  Reviewed with our office DOD Dr. Garen Lah who read the patients cardiology portion of the test. Per Dr. Garen Lah results can be addressed by Dr. Fletcher Anon when he returns on 05/21/19.

## 2019-05-18 ENCOUNTER — Ambulatory Visit: Payer: 59

## 2019-05-22 NOTE — Telephone Encounter (Signed)
See result note.  

## 2019-05-23 NOTE — Telephone Encounter (Signed)
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  She has calcified vessels but nothing seems to be obstructing the blood flow and thus her chest pain does not seem to be cardiac in origin.  We need to work on controlling her risk factors and stop smoking. The CT did show suspicious pulmonary nodules that the radiologist recommended a dedicated CT scan of the chest with contrast for further evaluation of those.  Please order and forward the results with my comments to her primary care physician. She should also come for a follow-up visit with me.

## 2019-05-23 NOTE — Telephone Encounter (Signed)
She should see her primary care physician within 2 weeks to discuss this.  She should not wait until May.  Please call Dr. Raylene Miyamoto nurse to make sure they received the results.

## 2019-05-23 NOTE — Telephone Encounter (Addendum)
Spoke with the patient. Patient made aware of Cardiac CTA results and Dr. Tyrell Antonio recommendation. Patient sts that she would like the results and recommendation fwd to her pcp. She is scheduled with her pcp in May 2021, she would like to defer to her pcp regarding the additional testing recommended. She will call our office back to schedule the f/u with cardiology.  Adv the patient that I will fwd the Cardiac CTA results with Dr. Tyrell Antonio recommendation as requested to her pcp and that she should f/u with their office regarding the recommended testing.  Patient verbalized understanding and voiced appreciation for the call.  Cardiac CT result sent to the pt pcp via [place] fax.

## 2019-05-24 ENCOUNTER — Other Ambulatory Visit: Payer: Self-pay | Admitting: Family Medicine

## 2019-05-24 DIAGNOSIS — R918 Other nonspecific abnormal finding of lung field: Secondary | ICD-10-CM

## 2019-05-24 NOTE — Telephone Encounter (Signed)
Called [facility] Dr. Raylene Miyamoto office and left a message for his nurse Amy. Called to confirm that they have received the copy of the patients Cardiac CTA with Dr. Tyrell Antonio recommendation and that the patient will need to be seen by Dr.Linthavong with in the next 2 weeks. Asked that Amy call back to confirm that the message was received.

## 2019-05-24 NOTE — Telephone Encounter (Signed)
Spoke with Amy Dr. Raylene Miyamoto nurse @ [facility]. They have received the copy of the patients Cardiac CTA results with the recommendations. They will schedule the recommended f/u chest CT and will move the patients appt up to be seen with in 2 weeks.

## 2019-06-06 ENCOUNTER — Other Ambulatory Visit: Payer: Self-pay

## 2019-06-06 ENCOUNTER — Ambulatory Visit
Admission: RE | Admit: 2019-06-06 | Discharge: 2019-06-06 | Disposition: A | Discharge status: Home / Self Care | Payer: 59 | Source: Ambulatory Visit | Attending: Family Medicine | Admitting: Family Medicine

## 2019-06-06 DIAGNOSIS — R918 Other nonspecific abnormal finding of lung field: Secondary | ICD-10-CM | POA: Diagnosis present

## 2019-06-06 MED ORDER — IOHEXOL 300 MG/ML  SOLN
75.0000 mL | Freq: Once | INTRAMUSCULAR | Status: AC | PRN
  Administered 2019-06-06: 75 mL via INTRAVENOUS

## 2019-08-08 ENCOUNTER — Telehealth: Payer: Self-pay | Admitting: Cardiovascular Disease

## 2019-08-08 NOTE — Telephone Encounter (Signed)
Per patient request deleting recall. She states her issue was temporary and fu not needed.

## 2019-08-09 NOTE — Telephone Encounter (Signed)
That is fine 

## 2019-10-10 ENCOUNTER — Ambulatory Visit: Payer: 59 | Admitting: Urology

## 2019-10-23 ENCOUNTER — Ambulatory Visit: Payer: 59 | Admitting: Urology

## 2020-06-10 ENCOUNTER — Other Ambulatory Visit: Payer: Self-pay | Admitting: Family Medicine

## 2020-06-10 DIAGNOSIS — Z1231 Encounter for screening mammogram for malignant neoplasm of breast: Secondary | ICD-10-CM

## 2020-06-24 ENCOUNTER — Other Ambulatory Visit: Payer: Self-pay

## 2020-06-24 ENCOUNTER — Ambulatory Visit
Admission: RE | Admit: 2020-06-24 | Discharge: 2020-06-24 | Disposition: A | Discharge status: Home / Self Care | Payer: 59 | Source: Ambulatory Visit | Attending: Family Medicine | Admitting: Family Medicine

## 2020-06-24 DIAGNOSIS — Z1231 Encounter for screening mammogram for malignant neoplasm of breast: Secondary | ICD-10-CM

## 2020-10-23 ENCOUNTER — Other Ambulatory Visit: Payer: Self-pay | Admitting: Physician Assistant

## 2020-10-23 ENCOUNTER — Ambulatory Visit
Admission: RE | Admit: 2020-10-23 | Discharge: 2020-10-23 | Disposition: A | Discharge status: Home / Self Care | Payer: 59 | Source: Ambulatory Visit | Attending: Physician Assistant | Admitting: Physician Assistant

## 2020-10-23 ENCOUNTER — Other Ambulatory Visit: Payer: Self-pay

## 2020-10-23 DIAGNOSIS — R1012 Left upper quadrant pain: Secondary | ICD-10-CM | POA: Diagnosis not present

## 2020-10-23 DIAGNOSIS — R1032 Left lower quadrant pain: Secondary | ICD-10-CM | POA: Insufficient documentation

## 2020-10-23 LAB — POCT I-STAT CREATININE: Creatinine, Ser: 0.7 mg/dL (ref 0.44–1.00)

## 2020-10-23 MED ORDER — IOHEXOL 350 MG/ML SOLN
100.0000 mL | Freq: Once | INTRAVENOUS | Status: AC | PRN
  Administered 2020-10-23: 100 mL via INTRAVENOUS

## 2020-10-24 ENCOUNTER — Other Ambulatory Visit: Payer: Self-pay | Admitting: Physician Assistant

## 2020-10-24 DIAGNOSIS — R911 Solitary pulmonary nodule: Secondary | ICD-10-CM

## 2020-11-05 ENCOUNTER — Other Ambulatory Visit: Payer: Self-pay

## 2020-11-05 ENCOUNTER — Ambulatory Visit
Admission: RE | Admit: 2020-11-05 | Discharge: 2020-11-05 | Disposition: A | Discharge status: Home / Self Care | Payer: 59 | Source: Ambulatory Visit | Attending: Physician Assistant | Admitting: Physician Assistant

## 2020-11-05 DIAGNOSIS — C3490 Malignant neoplasm of unspecified part of unspecified bronchus or lung: Secondary | ICD-10-CM | POA: Insufficient documentation

## 2020-11-05 DIAGNOSIS — R911 Solitary pulmonary nodule: Secondary | ICD-10-CM | POA: Diagnosis present

## 2020-11-05 LAB — GLUCOSE, CAPILLARY: Glucose-Capillary: 120 mg/dL — ABNORMAL HIGH (ref 70–99)

## 2020-11-05 MED ORDER — FLUDEOXYGLUCOSE F - 18 (FDG) INJECTION
10.1200 | Freq: Once | INTRAVENOUS | Status: AC | PRN
  Administered 2020-11-05: 10.12 via INTRAVENOUS

## 2020-11-11 ENCOUNTER — Other Ambulatory Visit: Payer: Self-pay | Admitting: Pulmonary Disease

## 2020-11-11 DIAGNOSIS — J948 Other specified pleural conditions: Secondary | ICD-10-CM

## 2020-11-12 ENCOUNTER — Encounter: Payer: Self-pay | Admitting: *Deleted

## 2020-11-12 NOTE — Progress Notes (Signed)
Referral received. Per Dr. Jacinto Reap okay to schedule pt to see him on Fri 11/14/20 at 9:15am for new patient visit. Appt scheduled and pt made aware of appt. Pt confirmed appt. All questions answered during call. Contact info given to pt and instructed to call with any questions or needs. Pt verbalized understanding.

## 2020-11-13 ENCOUNTER — Encounter: Payer: Self-pay | Admitting: *Deleted

## 2020-11-14 ENCOUNTER — Inpatient Hospital Stay: Payer: 59 | Attending: Internal Medicine | Admitting: Internal Medicine

## 2020-11-14 ENCOUNTER — Inpatient Hospital Stay: Payer: 59

## 2020-11-14 ENCOUNTER — Encounter: Payer: Self-pay | Admitting: *Deleted

## 2020-11-14 ENCOUNTER — Encounter: Payer: Self-pay | Admitting: Internal Medicine

## 2020-11-14 DIAGNOSIS — I10 Essential (primary) hypertension: Secondary | ICD-10-CM | POA: Diagnosis not present

## 2020-11-14 DIAGNOSIS — R918 Other nonspecific abnormal finding of lung field: Secondary | ICD-10-CM

## 2020-11-14 DIAGNOSIS — E119 Type 2 diabetes mellitus without complications: Secondary | ICD-10-CM | POA: Diagnosis not present

## 2020-11-14 DIAGNOSIS — J449 Chronic obstructive pulmonary disease, unspecified: Secondary | ICD-10-CM | POA: Diagnosis not present

## 2020-11-14 DIAGNOSIS — Z85850 Personal history of malignant neoplasm of thyroid: Secondary | ICD-10-CM | POA: Insufficient documentation

## 2020-11-14 DIAGNOSIS — Z7952 Long term (current) use of systemic steroids: Secondary | ICD-10-CM | POA: Insufficient documentation

## 2020-11-14 DIAGNOSIS — R911 Solitary pulmonary nodule: Secondary | ICD-10-CM | POA: Insufficient documentation

## 2020-11-14 DIAGNOSIS — F17210 Nicotine dependence, cigarettes, uncomplicated: Secondary | ICD-10-CM | POA: Insufficient documentation

## 2020-11-14 DIAGNOSIS — Z90710 Acquired absence of both cervix and uterus: Secondary | ICD-10-CM | POA: Diagnosis not present

## 2020-11-14 DIAGNOSIS — Z79899 Other long term (current) drug therapy: Secondary | ICD-10-CM | POA: Insufficient documentation

## 2020-11-14 DIAGNOSIS — R0789 Other chest pain: Secondary | ICD-10-CM | POA: Diagnosis not present

## 2020-11-14 DIAGNOSIS — E039 Hypothyroidism, unspecified: Secondary | ICD-10-CM | POA: Insufficient documentation

## 2020-11-14 DIAGNOSIS — R519 Headache, unspecified: Secondary | ICD-10-CM | POA: Diagnosis not present

## 2020-11-14 MED ORDER — TRAMADOL HCL 50 MG PO TABS: 50.0000 mg | ORAL_TABLET | Freq: Three times a day (TID) | ORAL | 0 refills | Status: DC | PRN

## 2020-11-14 NOTE — Progress Notes (Signed)
[place] NOTE  Patient Care Team: Dion Body, MD as PCP - General (Family Medicine) Leata Mouse (Inactive) Byrnett, Forest Gleason, MD (General Surgery) Telford Nab, RN as Oncology Nurse Navigator  CHIEF COMPLAINTS/PURPOSE OF CONSULTATION: lung nodule/mass  # MARCH 2022- incidental LLL [ 0.9 cm] s/p COVID vaccine; [Dr.Aleskerov]; July 2022- left pleural pain-   COPD-non-complaint  Oncology History Overview Note  IMPRESSION: Hypermetabolic solid pulmonary nodule of the left lower lobe, favor primary lung malignancy.   Numerous hypermetabolic nodules of the lower left pleura, compatible with pleural metastatic disease.   Hypermetabolic subcarinal and left hilar lymph nodes, compatible with metastatic disease.   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 Recommend attention on follow-up.   Mild asymmetric FDG uptake below mediastinal blood pool within a small nodule of the left parotid gland, favored to be benign. Recommend attention on follow-up.   No evidence of metastatic disease in the abdomen or pelvis.   No evidence of osseous metastatic disease.     Electronically Signed   By: Yetta Glassman M.D.   On: 11/06/2020 10:06   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[place])  05/02/2015 Initial Diagnosis   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[facility])      HISTORY OF PRESENTING ILLNESS: Ambulating independently.  Alone. Felicia Manning 65 y.o.  female history of smoking is here for further evaluation and recommendations for lung mass.    Patient noted to have a left lower lobe lung nodule approximately year ago when she had a CT scan for chest pain post COVID vaccination.  Patient unfortunately did not follow-up as recommended with surveillance  imaging.  More recently noted to have worsening left chest wall pain especially with deep breaths/eating.  Reevaluated by PCP with a CT scan that shows progressive lung lesions.  Refer to Dr. Lanney Gins for further [facility] include a PET scan as noted above.  Patient complains of continued left chest wall pain when taking deep breath/eating.  Appetite is fair.  No weight loss.  Intermittent headaches.  No vision changes.   Patient's chest wall pain has been treated with prednisone currently on 10 mg a day.  Improved on hydrocodone.  Not on any pain medication.    Review of Systems  Constitutional:  Negative for chills, diaphoresis, fever, malaise/fatigue and weight loss.  HENT:  Negative for nosebleeds and sore throat.   Eyes:  Negative for double vision.  Respiratory:  Positive for cough, sputum production and wheezing. Negative for hemoptysis and shortness of breath.   Cardiovascular:  Positive for chest pain. Negative for palpitations, orthopnea and leg swelling.  Gastrointestinal:  Negative for abdominal pain, blood in stool, constipation, diarrhea, heartburn, melena, nausea and vomiting.  Genitourinary:  Negative for dysuria, frequency and urgency.  Musculoskeletal:  Negative for back pain and joint pain.  Skin: Negative.  Negative for itching and rash.  Neurological:  Positive for headaches. Negative for dizziness, tingling, focal weakness and weakness.  Endo/Heme/Allergies:  Does not bruise/bleed easily.  Psychiatric/Behavioral:  Negative for depression. The patient is not nervous/anxious and does not have insomnia.     MEDICAL HISTORY:  Past Medical History:  Diagnosis Date   Barrett's esophagus    Dysrhythmia    stress related at work.   GERD (gastroesophageal reflux disease)    Hepatitis C 2008   Hyperlipidemia  Hypertension    Hypothyroidism    Nephrolithiasis    Osteoporosis    Pulmonary mass    Thyroid cancer ([place]) 2012   Thyroid cancer [facility])     SURGICAL  HISTORY: Past Surgical History:  Procedure Laterality Date   ABDOMINAL HYSTERECTOMY  2006   COLONOSCOPY WITH PROPOFOL N/A 04/25/2015   Procedure: COLONOSCOPY WITH PROPOFOL;  Surgeon: Josefine Class, MD;  Location: [facility] ENDOSCOPY;  Service: Endoscopy;  Laterality: N/A;   CYSTOSCOPY W/ RETROGRADES Bilateral 05/19/2015   Procedure: CYSTOSCOPY WITH RETROGRADE PYELOGRAM;  Surgeon: Hollice Espy, MD;  Location: [facility] ORS;  Service: Urology;  Laterality: Bilateral;   CYSTOSCOPY W/ URETERAL STENT PLACEMENT Right 05/26/2015   Procedure: CYSTOSCOPY WITH STENT REPLACEMENT;  Surgeon: Hollice Espy, MD;  Location: [facility] ORS;  Service: Urology;  Laterality: Right;   CYSTOSCOPY WITH STENT PLACEMENT Right 05/19/2015   Procedure: CYSTOSCOPY WITH STENT PLACEMENT;  Surgeon: Hollice Espy, MD;  Location: [facility] ORS;  Service: Urology;  Laterality: Right;   CYSTOSCOPY WITH STENT PLACEMENT Right 05/26/2015   Procedure: CYSTOSCOPY WITH STENT PLACEMENT;  Surgeon: Hollice Espy, MD;  Location: [facility] ORS;  Service: Urology;  Laterality: Right;   CYSTOSCOPY WITH STENT PLACEMENT Right 06/25/2016   Procedure: CYSTOSCOPY WITH STENT PLACEMENT;  Surgeon: Nickie Retort, MD;  Location: [facility] ORS;  Service: Urology;  Laterality: Right;   CYSTOSCOPY WITH STENT PLACEMENT Left 10/31/2017   Procedure: CYSTOSCOPY WITH STENT PLACEMENT;  Surgeon: Hollice Espy, MD;  Location: [facility] ORS;  Service: Urology;  Laterality: Left;   CYSTOSCOPY/URETEROSCOPY/HOLMIUM LASER/STENT PLACEMENT Left 07/23/2015   Procedure: CYSTOSCOPY/URETEROSCOPY/HOLMIUM LASER/STENT PLACEMENT/RETROGRADE PYELOGRAM;  Surgeon: Hollice Espy, MD;  Location: [facility] ORS;  Service: Urology;  Laterality: Left;   CYSTOSCOPY/URETEROSCOPY/HOLMIUM LASER/STENT PLACEMENT Left 11/16/2017   Procedure: CYSTOSCOPY/URETEROSCOPY/HOLMIUM LASER/STENT Exchange;  Surgeon: Hollice Espy, MD;  Location: [facility] ORS;  Service: Urology;  Laterality: Left;   ESOPHAGOGASTRODUODENOSCOPY (EGD) WITH  PROPOFOL N/A 04/25/2015   Procedure: ESOPHAGOGASTRODUODENOSCOPY (EGD) WITH PROPOFOL;  Surgeon: Josefine Class, MD;  Location: [facility] ENDOSCOPY;  Service: Endoscopy;  Laterality: N/A;   EYE SURGERY Bilateral 1964   lazy eye repair   EYE SURGERY Bilateral 2001   lazy eye repair   LITHOTRIPSY  2009   THYROIDECTOMY  2010   URETEROSCOPY WITH HOLMIUM LASER LITHOTRIPSY Right 05/19/2015   Procedure: URETEROSCOPY WITH HOLMIUM LASER LITHOTRIPSY;  Surgeon: Hollice Espy, MD;  Location: [facility] ORS;  Service: Urology;  Laterality: Right;   URETEROSCOPY WITH HOLMIUM LASER LITHOTRIPSY Right 05/26/2015   Procedure: URETEROSCOPY WITH HOLMIUM LASER LITHOTRIPSY;  Surgeon: Hollice Espy, MD;  Location: [facility] ORS;  Service: Urology;  Laterality: Right;   URETEROSCOPY WITH HOLMIUM LASER LITHOTRIPSY Right 06/25/2016   Procedure: URETEROSCOPY WITH HOLMIUM LASER LITHOTRIPSY;  Surgeon: Nickie Retort, MD;  Location: [facility] ORS;  Service: Urology;  Laterality: Right;    SOCIAL HISTORY: Social History   Socioeconomic History   Marital status: Divorced    Spouse name: Not on file   Number of children: Not on file   Years of education: Not on file   Highest education level: Not on file  Occupational History   Not on file  Tobacco Use   Smoking status: Every Day    Packs/day: 1.00    Years: 40.00    Pack years: 40.00    Types: Cigarettes   Smokeless tobacco: Never  Vaping Use   Vaping Use: Never used  Substance and Sexual Activity   Alcohol use: Yes    Comment: socially   Drug use: No  Sexual activity: Not Currently    Birth control/protection: None  Other Topics Concern   Not on file  Social History Narrative   Smoking: 1p 1-2 days since 17 years; alcohol: rare; 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     Social Determinants of Health   Financial Resource Strain: Not on file  Food Insecurity: Not on file  Transportation  Needs: Not on file  Physical Activity: Not on file  Stress: Not on file  Social Connections: Not on file  Intimate Partner Violence: Not on file    FAMILY HISTORY: Family History  Problem Relation Age of Onset   CAD Mother    Heart disease Mother    Dementia Father    Bladder Cancer Neg Hx    Prostate cancer Neg Hx    Kidney cancer Neg Hx    Breast cancer Neg Hx     ALLERGIES:  has No Known Allergies.  MEDICATIONS:  Current Outpatient Medications  Medication Sig Dispense Refill   alendronate (FOSAMAX) 70 MG tablet One tab every Sunday on empty stomach with a full glass of water. Do not lie down or eat/drink anything else for the next 30 min.     amLODipine (NORVASC) 5 MG tablet Take 5 mg by mouth every morning.      amoxicillin-clavulanate (AUGMENTIN) 875-125 MG tablet Take 1 tablet by mouth 2 (two) times daily.     atorvastatin (LIPITOR) 80 MG tablet Take 80 mg by mouth daily.     buPROPion (WELLBUTRIN SR) 150 MG 12 hr tablet Take 1 tablet by mouth 2 (two) times daily.     cholecalciferol (VITAMIN D) 1000 units tablet Take 2,000 Units by mouth daily.     Cranberry-Vitamin C-Probiotic (AZO CRANBERRY PO) Take 1 tablet by mouth daily.     HYDROcodone-acetaminophen (NORCO/VICODIN) 5-325 MG tablet Take by mouth.     levothyroxine (SYNTHROID, LEVOTHROID) 150 MCG tablet Take 150 mcg by mouth at bedtime.      losartan (COZAAR) 100 MG tablet Take 100 mg by mouth every morning.      metoprolol succinate (TOPROL-XL) 25 MG 24 hr tablet Take 25 mg by mouth every morning.   1   omeprazole (PRILOSEC) 20 MG capsule Take 20 mg by mouth daily before breakfast.      predniSONE (DELTASONE) 10 MG tablet Take 10 mg by mouth daily.     traMADol (ULTRAM) 50 MG tablet Take 1 tablet (50 mg total) by mouth every 8 (eight) hours as needed. 60 tablet 0   buPROPion (WELLBUTRIN XL) 150 MG 24 hr tablet Take 150 mg by mouth daily.     conjugated estrogens (PREMARIN) vaginal cream Place 1 Applicatorful  vaginally daily. Use pea sized amount M-W-Fr before bedtime 42.5 g 12   lovastatin (MEVACOR) 40 MG tablet Take 40 mg by mouth at bedtime.     No current facility-administered medications for this visit.      Marland Kitchen  PHYSICAL EXAMINATION: ECOG PERFORMANCE STATUS: 1 - Symptomatic but completely ambulatory  Vitals:   11/14/20 0908  BP: (!) 119/59  Pulse: 67  Resp: 20  Temp: 97.8 F (36.6 C)  SpO2: 98%   Filed Weights   11/14/20 0908  Weight: 196 lb (88.9 kg)    Physical Exam Vitals and nursing note reviewed.  HENT:     Head: Normocephalic and atraumatic.     Mouth/Throat:     Pharynx: Oropharynx is clear.  Eyes:  Extraocular Movements: Extraocular movements intact.     Pupils: Pupils are equal, round, and reactive to light.  Cardiovascular:     Rate and Rhythm: Normal rate and regular rhythm.  Pulmonary:     Comments: Decreased breath sounds bilaterally.  Abdominal:     Palpations: Abdomen is soft.  Musculoskeletal:        General: Normal range of motion.     Cervical back: Normal range of motion.  Skin:    General: Skin is warm.  Neurological:     General: No focal deficit present.     Mental Status: She is alert and oriented to person, place, and time.  Psychiatric:        Behavior: Behavior normal.        Judgment: Judgment normal.     LABORATORY DATA:  I have reviewed the data as listed Lab Results  Component Value Date   WBC 12.8 (H) 03/26/2019   HGB 14.1 03/26/2019   HCT 43.5 03/26/2019   MCV 91.4 03/26/2019   PLT 273 03/26/2019   Recent Labs    10/23/20 1224  CREATININE 0.70    RADIOGRAPHIC STUDIES: I have personally reviewed the radiological images as listed and agreed with the findings in the report. CT ABDOMEN PELVIS W CONTRAST  Result Date: 10/23/2020 CLINICAL DATA:  Left-sided abdominal pain for 1 month. EXAM: CT ABDOMEN AND PELVIS WITH CONTRAST TECHNIQUE: Multidetector CT imaging of the abdomen and pelvis was performed using the  standard protocol following bolus administration of intravenous contrast. CONTRAST:  158mL OMNIPAQUE IOHEXOL 350 MG/ML SOLN COMPARISON:  AP CT on 12/06/2017 FINDINGS: Lower Chest: A well-circumscribed solid nodule is seen in left costophrenic sulcus which measures 2.5 x 1.8 cm, increased in size from 0.9 cm on more recent chest CT of 06/06/2019. Hepatobiliary: Mild diffuse hepatic steatosis is again seen, with focal fatty sparing adjacent to the gallbladder fossa. Small cyst in the anterior right hepatic lobe is stable. No liver masses identified. Gallbladder is unremarkable. No evidence of biliary ductal dilatation. Pancreas:  No mass or inflammatory changes. Spleen: Within normal limits in size and appearance. Adrenals/Urinary Tract: Small bilateral renal calculi are again seen, largest in right kidney measuring 10 mm. No evidence ureteral calculi or hydronephrosis. Mild right renal parenchymal scarring is again noted as well as several benign-appearing bilateral renal cysts. No renal masses are identified. Stomach/Bowel: Small hiatal hernia is increased in size. No evidence of obstruction, inflammatory process or abnormal fluid collections. Diverticulosis is seen mainly involving the descending and sigmoid colon, however there is no evidence of diverticulitis. Vascular/Lymphatic: No pathologically enlarged lymph nodes. No acute vascular findings. Aortic atherosclerotic calcification noted. Reproductive: Prior hysterectomy noted. Adnexal regions are unremarkable in appearance. Other:  None. Musculoskeletal:  No suspicious bone lesions identified. IMPRESSION: Bilateral nephrolithiasis. No evidence of ureteral calculi, hydronephrosis, or other acute findings within the abdomen or pelvis. Colonic diverticulosis, without radiographic evidence of diverticulitis. Small hiatal hernia. Mild hepatic steatosis. Increased size of 2.5 cm solid pulmonary nodule in left costophrenic sulcus, highly suspicious for primary or  metastatic malignancy. Consider further evaluation with PET-CT or chest CT with contrast. Aortic Atherosclerosis (ICD10-I70.0). Electronically Signed   By: Marlaine Hind M.D.   On: 10/23/2020 13:32   NM PET Image Initial (PI) Skull Base To Thigh (F-18 FDG)  Result Date: 11/06/2020 CLINICAL DATA:  Initial treatment strategy for pulmonary nodule. EXAM: NUCLEAR MEDICINE PET SKULL BASE TO THIGH TECHNIQUE: 10.1 mCi F-18 FDG was injected intravenously. Full-ring PET imaging was performed  from the skull base to thigh after the radiotracer. CT data was obtained and used for attenuation correction and anatomic localization. Fasting blood glucose: 120 mg/dl COMPARISON:  CT abdomen and pelvis dated October 23, 2020; CT chest dated June 06, 2019 FINDINGS: Mediastinal blood pool activity: SUV max 2.3 Liver activity: SUV max 2.9 NECK: Nodule of the left parotid gland measuring 4 mm on series 3, image 38 with a SUV max of below mediastinal blood pool, favored to be benign. No hypermetabolic lymph nodes in the neck. Incidental CT findings: none CHEST: Hypermetabolic subcarinal and left hilar lymph nodes. Reference subcarinal lymph node measuring 1.4 cm in short axis series 3, image 98 with an SUV max of 12.7. Reference left hilar lymph node measuring 1.3 cm in short axis on series 3, image 102 with an SUV max of 10.5. Left lower solid lobe nodule measuring 2.3 x 1.8 cm on series 3 image 133 within SUV max of 7.3, when accounting for misregistration artifact, unchanged in size compared with most recent prior CT. Numerous hypermetabolic nodules of the lower left pleura are seen and unchanged in size compared to most recent prior CT. Reference nodule measures 4.2 x 1.4 cm on series 3, image 148 with an SUV max of 10.1. Additional bilateral subsolid pulmonary nodules are seen which are unchanged compared to June 06, 2019 CT, and too small to characterize for FDG avidity. Reference nodule of the right lower lobe on series 3, image  102 measuring 1.1 cm. Incidental CT findings: Normal heart size. No pericardial effusion. Calcifications of the LAD and RCA. Moderate hiatal hernia. Surgically absent thyroid. ABDOMEN/PELVIS: No abnormal hypermetabolic activity within the liver, pancreas, adrenal glands, or spleen. No hypermetabolic lymph nodes in the abdomen or pelvis. Incidental CT findings: Low-density lesion of the right hepatic lobe with no FDG avidity, likely a simple cyst. Cholelithiasis with no gallbladder wall thickening. No biliary ductal dilation. Pancreas spleen, and bilateral adrenal glands are unremarkable. No hydronephrosis bilaterally bilateral nonobstructing renal stones. Bilateral low-density renal lesions with no FDG avidity, likely simple cysts. Bladder is decompressed. Diverticula of the descending and sigmoid colon. No evidence of bowel wall thickening or obstruction. Normal appendix. SKELETON: No focal hypermetabolic activity to suggest skeletal metastasis. Incidental CT findings: none IMPRESSION: Hypermetabolic solid pulmonary nodule of the left lower lobe, favor primary lung malignancy. Numerous hypermetabolic nodules of the lower left pleura, compatible with pleural metastatic disease. Hypermetabolic subcarinal and left hilar lymph nodes, compatible with metastatic disease. 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 Recommend attention on follow-up. Mild asymmetric FDG uptake below mediastinal blood pool within a small nodule of the left parotid gland, favored to be benign. Recommend attention on follow-up. No evidence of metastatic disease in the abdomen or pelvis. No evidence of osseous metastatic disease. Electronically Signed   By: Yetta Glassman M.D.   On: 11/06/2020 10:06    ASSESSMENT & PLAN:   Mass of lower lobe of left  lung #September 2022 PET scan - hypermetabolic solid pulmonary nodule of the left lower lobe, favor primary lung malignancy; numerous hypermetabolic nodules left lower lobe pleura; subcarinal/left hilar lymph nodes-positive for metastatic disease.  Bilateral sub-solid nodules compared to CT April 2021.  No evidence of abdomen pelvis metastases or bone metastasis.   #I reviewed with the patient-imaging findings highly concerning for malignancy.  Proceed with  biopsy.  Awaiting/ordered by pulmonary.  #Discussed that patient will need systemic therapy-once biopsy is available.  3 to 4 days  #Ongoing headaches: Intermittent-stress versus metastases.  Order stat MRI.   # left chest wall pain:prednisone 10 mg/day.  Not well controlled.  Recommend 50 mg tramadaol every 8 hours as needed   #COPD: Noncompliant with inhalers.  Recommend follow up with pulmonary.   #Diabetes-A1c 6.9 [AUG 2022]-not on medications; diet controlled.  This will need to be monitored closely while on chemotherapy/steroids.  Patient will likely need medications.  Thank you Dr.Alsekerov  for allowing me to participate in the care of your pleasant patient. Please do not hesitate to contact me with questions or concerns in the interim.  Discussed with [facility].  DISPOSITION: # no labs today # MRI brain STAT # follow up in 3-4 days after Biopsy- Dr.B  # I reviewed the blood work- with the patient in detail; also reviewed the imaging independently [as summarized above]; and with the patient in detail.    cc; Drs.L/A      All questions were answered. The patient knows to call the clinic with any problems, questions or concerns.       Cammie Sickle, MD 11/14/2020 12:35 PM

## 2020-11-14 NOTE — Progress Notes (Signed)
Met with patient during initial consult with Dr. Rogue Bussing. All questions answered during visit. Pt given contact info and instructed to call with any questions or needs. Reviewed upcoming appts. Pt informed that will be called once biopsy and follow up appts have been scheduled. Pt verbalized understanding. Nothing further needed at this time.

## 2020-11-14 NOTE — Assessment & Plan Note (Addendum)
#  September 2022 PET scan - hypermetabolic solid pulmonary nodule of the left lower lobe, favor primary lung malignancy; numerous hypermetabolic nodules left lower lobe pleura; subcarinal/left hilar lymph nodes-positive for metastatic disease.  Bilateral sub-solid nodules compared to CT April 2021.  No evidence of abdomen pelvis metastases or bone metastasis.  #I reviewed with the patient-imaging findings highly concerning for malignancy.  Proceed with biopsy.  Awaiting/ordered by pulmonary.  #Discussed that patient will need systemic therapy-once biopsy is available.  3 to 4 days  #Ongoing headaches: Intermittent-stress versus metastases.  Order stat MRI.   # left chest wall pain:prednisone 10 mg/day.  Not well controlled.  Recommend 50 mg tramadaol every 8 hours as needed   #COPD: Noncompliant with inhalers.  Recommend follow up with pulmonary.   #Diabetes-A1c 6.9 [AUG 2022]-not on medications; diet controlled.  This will need to be monitored closely while on chemotherapy/steroids.  Patient will likely need medications.  Thank you Dr.Alsekerov  for allowing me to participate in the care of your pleasant patient. Please do not hesitate to contact me with questions or concerns in the interim.  Discussed with [facility].  DISPOSITION: # no labs today # MRI brain STAT # follow up in 3-4 days after Biopsy- Dr.B  # I reviewed the blood work- with the patient in detail; also reviewed the imaging independently [as summarized above]; and with the patient in detail.   cc; Drs.L/A

## 2020-11-16 ENCOUNTER — Telehealth: Payer: Self-pay | Admitting: Nurse Practitioner

## 2020-11-16 ENCOUNTER — Ambulatory Visit
Admission: RE | Admit: 2020-11-16 | Discharge: 2020-11-16 | Disposition: A | Discharge status: Home / Self Care | Payer: 59 | Source: Ambulatory Visit | Attending: Internal Medicine | Admitting: Internal Medicine

## 2020-11-16 DIAGNOSIS — R918 Other nonspecific abnormal finding of lung field: Secondary | ICD-10-CM | POA: Insufficient documentation

## 2020-11-16 MED ORDER — HYDROCODONE-ACETAMINOPHEN 5-325 MG PO TABS: 1.0000 | ORAL_TABLET | Freq: Three times a day (TID) | ORAL | 0 refills | Status: AC | PRN

## 2020-11-16 MED ORDER — GADOBUTROL 1 MMOL/ML IV SOLN
7.5000 mL | Freq: Once | INTRAVENOUS | Status: AC | PRN
  Administered 2020-11-16: 7.5 mL via INTRAVENOUS

## 2020-11-16 NOTE — Telephone Encounter (Signed)
Spoke to patient. She believes her pain is too severe to tolerating laying for MRI scheduled for later today. Will send norco to pharmacy. She will take one pill an hour before her imaging study and can take second pill if needed. She has a driver. Opioid precautions reviewed. Follow up with Dr. Rogue Bussing.

## 2020-11-17 ENCOUNTER — Telehealth: Payer: Self-pay | Admitting: *Deleted

## 2020-11-17 ENCOUNTER — Encounter: Payer: Self-pay | Admitting: Internal Medicine

## 2020-11-17 NOTE — Progress Notes (Signed)
I spoke to patient regarding the results of the MRI punctate lesion unlikely to be symptomatic.  Haley please have this MRI reviewed by Dr. Mickeal Skinner. I will also be happy to talk to him once he gets a chance to review the films.  Patient's pain is not well-controlled on tramadol. Hydrocodone one pill day seems to be helping. Again patient works full-time/concerned about drowsiness.  Discuss with the patient that we tryin g to get a biopsy appointment sooner for her. For now plan for 11th of October. Also discuss with Dr.Aleskerov.

## 2020-11-17 NOTE — Telephone Encounter (Signed)
Called report  IMPRESSION: Apparent punctate focus of enhancement within the right cerebellar hemisphere, appreciated on the coronal T1 weighted postcontrast sequence only. While this may reflect a focus of vascular enhancement or artifact, a punctate metastasis cannot be excluded. Short-interval 4-6 week brain MRI follow-up with contrast recommended.   Asymmetric left hippocampal volume loss and T2 FLAIR hyperintense signal abnormality. If the patient has a history of seizures, this may reflect mesial temporal sclerosis. Alternatively, this may reflect sequela of a nonspecific remote insult.   Mild chronic small-vessel ischemic changes within the cerebral white matter.   Mild generalized parenchymal atrophy.     Electronically Signed   By: Kellie Simmering D.O.   On: 11/17/2020 08:57

## 2020-11-18 ENCOUNTER — Other Ambulatory Visit: Payer: Self-pay | Admitting: Radiation Therapy

## 2020-11-18 ENCOUNTER — Encounter: Payer: Self-pay | Admitting: *Deleted

## 2020-11-20 NOTE — Progress Notes (Signed)
Patient on schedule for Lung biopsy, called and left instructions on phone for patient since no answer. Encouraged patient to call back if questions. Made aware to be here @ 0830, NPO after MN prior to procedure/and driver post procedure/recovery/discharge.

## 2020-11-24 ENCOUNTER — Inpatient Hospital Stay: Payer: 59

## 2020-11-24 ENCOUNTER — Other Ambulatory Visit: Payer: Self-pay | Admitting: Radiology

## 2020-11-25 ENCOUNTER — Ambulatory Visit
Admission: RE | Admit: 2020-11-25 | Discharge: 2020-11-25 | Disposition: A | Discharge status: Home / Self Care | Payer: 59 | Source: Ambulatory Visit | Attending: Pulmonary Disease | Admitting: Pulmonary Disease

## 2020-11-25 ENCOUNTER — Other Ambulatory Visit: Payer: Self-pay

## 2020-11-25 DIAGNOSIS — R918 Other nonspecific abnormal finding of lung field: Secondary | ICD-10-CM | POA: Diagnosis not present

## 2020-11-25 DIAGNOSIS — R079 Chest pain, unspecified: Secondary | ICD-10-CM | POA: Diagnosis not present

## 2020-11-25 DIAGNOSIS — R109 Unspecified abdominal pain: Secondary | ICD-10-CM | POA: Insufficient documentation

## 2020-11-25 DIAGNOSIS — J948 Other specified pleural conditions: Secondary | ICD-10-CM | POA: Diagnosis not present

## 2020-11-25 DIAGNOSIS — R0602 Shortness of breath: Secondary | ICD-10-CM | POA: Insufficient documentation

## 2020-11-25 DIAGNOSIS — F17210 Nicotine dependence, cigarettes, uncomplicated: Secondary | ICD-10-CM | POA: Insufficient documentation

## 2020-11-25 LAB — CBC
HCT: 37.9 % (ref 36.0–46.0)
Hemoglobin: 12.9 g/dL (ref 12.0–15.0)
MCH: 30 pg (ref 26.0–34.0)
MCHC: 34 g/dL (ref 30.0–36.0)
MCV: 88.1 fL (ref 80.0–100.0)
Platelets: 231 10*3/uL (ref 150–400)
RBC: 4.3 MIL/uL (ref 3.87–5.11)
RDW: 15.9 % — ABNORMAL HIGH (ref 11.5–15.5)
WBC: 13.9 10*3/uL — ABNORMAL HIGH (ref 4.0–10.5)
nRBC: 0 % (ref 0.0–0.2)

## 2020-11-25 LAB — PROTIME-INR
INR: 0.9 (ref 0.8–1.2)
Prothrombin Time: 12.6 seconds (ref 11.4–15.2)

## 2020-11-25 MED ORDER — SODIUM CHLORIDE 0.9 % IV SOLN: INTRAVENOUS | Status: DC

## 2020-11-25 MED ORDER — FENTANYL CITRATE (PF) 100 MCG/2ML IJ SOLN
INTRAMUSCULAR | Status: AC
  Filled 2020-11-25: qty 2

## 2020-11-25 MED ORDER — MIDAZOLAM HCL 2 MG/2ML IJ SOLN
INTRAMUSCULAR | Status: AC
  Filled 2020-11-25: qty 4

## 2020-11-25 NOTE — Procedures (Signed)
Interventional Radiology Procedure Note  Procedure: CT Guided Biopsy of left pleural mass  Complications: None  Estimated Blood Loss: < 10 mL  Findings: 18 G core biopsy of left pleural mass performed under CT guidance.  Three core samples obtained and sent to Pathology.  Venetia Night. Kathlene Cote, M.D Pager:  [phone]

## 2020-11-25 NOTE — H&P (Signed)
Chief Complaint: Patient was seen in consultation today for lung mass at the request of Aleskerov,Fuad  Referring Physician(s): Aleskerov,Fuad  Supervising Physician: Aletta Edouard  Patient Status: [facility] - Out-pt  History of Present Illness: Felicia Manning is a 65 y.o. female with history of tobacco use and shortness of breath with left sided abdomen/chest pain. Imaging done revealed hypermetabolic solid pulmonary nodule of the left lower lobe, numerous hypermetabolic nodules of the lower left pleura, hypermetabolic subcarinal and left hilar lymph nodes. Patient has been seen by Oncology and Pulmonary and request received for image guided lung biopsy. The patient has had a H&P performed within the last 30 days, all history, medications, and exam have been reviewed. The patient denies any interval changes since the H&P.  The patient denies any current chest pain, shortness of breath or palpitations. The patient denies any history of sleep apnea or chronic oxygen use. She has no known complications to sedation.    Past Medical History:  Diagnosis Date   Barrett's esophagus    Dysrhythmia    stress related at work.   GERD (gastroesophageal reflux disease)    Hepatitis C 2008   Hyperlipidemia    Hypertension    Hypothyroidism    Nephrolithiasis    Osteoporosis    Pulmonary mass    Thyroid cancer ([place]) 2012   Thyroid cancer [facility])     Past Surgical History:  Procedure Laterality Date   ABDOMINAL HYSTERECTOMY  2006   COLONOSCOPY WITH PROPOFOL N/A 04/25/2015   Procedure: COLONOSCOPY WITH PROPOFOL;  Surgeon: Josefine Class, MD;  Location: [facility] ENDOSCOPY;  Service: Endoscopy;  Laterality: N/A;   CYSTOSCOPY W/ RETROGRADES Bilateral 05/19/2015   Procedure: CYSTOSCOPY WITH RETROGRADE PYELOGRAM;  Surgeon: Hollice Espy, MD;  Location: [facility] ORS;  Service: Urology;  Laterality: Bilateral;   CYSTOSCOPY W/ URETERAL STENT PLACEMENT Right 05/26/2015   Procedure: CYSTOSCOPY WITH STENT  REPLACEMENT;  Surgeon: Hollice Espy, MD;  Location: [facility] ORS;  Service: Urology;  Laterality: Right;   CYSTOSCOPY WITH STENT PLACEMENT Right 05/19/2015   Procedure: CYSTOSCOPY WITH STENT PLACEMENT;  Surgeon: Hollice Espy, MD;  Location: [facility] ORS;  Service: Urology;  Laterality: Right;   CYSTOSCOPY WITH STENT PLACEMENT Right 05/26/2015   Procedure: CYSTOSCOPY WITH STENT PLACEMENT;  Surgeon: Hollice Espy, MD;  Location: [facility] ORS;  Service: Urology;  Laterality: Right;   CYSTOSCOPY WITH STENT PLACEMENT Right 06/25/2016   Procedure: CYSTOSCOPY WITH STENT PLACEMENT;  Surgeon: Nickie Retort, MD;  Location: [facility] ORS;  Service: Urology;  Laterality: Right;   CYSTOSCOPY WITH STENT PLACEMENT Left 10/31/2017   Procedure: CYSTOSCOPY WITH STENT PLACEMENT;  Surgeon: Hollice Espy, MD;  Location: [facility] ORS;  Service: Urology;  Laterality: Left;   CYSTOSCOPY/URETEROSCOPY/HOLMIUM LASER/STENT PLACEMENT Left 07/23/2015   Procedure: CYSTOSCOPY/URETEROSCOPY/HOLMIUM LASER/STENT PLACEMENT/RETROGRADE PYELOGRAM;  Surgeon: Hollice Espy, MD;  Location: [facility] ORS;  Service: Urology;  Laterality: Left;   CYSTOSCOPY/URETEROSCOPY/HOLMIUM LASER/STENT PLACEMENT Left 11/16/2017   Procedure: CYSTOSCOPY/URETEROSCOPY/HOLMIUM LASER/STENT Exchange;  Surgeon: Hollice Espy, MD;  Location: [facility] ORS;  Service: Urology;  Laterality: Left;   ESOPHAGOGASTRODUODENOSCOPY (EGD) WITH PROPOFOL N/A 04/25/2015   Procedure: ESOPHAGOGASTRODUODENOSCOPY (EGD) WITH PROPOFOL;  Surgeon: Josefine Class, MD;  Location: [facility] ENDOSCOPY;  Service: Endoscopy;  Laterality: N/A;   EYE SURGERY Bilateral 1964   lazy eye repair   EYE SURGERY Bilateral 2001   lazy eye repair   LITHOTRIPSY  2009   THYROIDECTOMY  2010   URETEROSCOPY WITH HOLMIUM LASER LITHOTRIPSY Right 05/19/2015   Procedure: URETEROSCOPY WITH HOLMIUM LASER LITHOTRIPSY;  Surgeon: Hollice Espy, MD;  Location: [facility] ORS;  Service: Urology;  Laterality: Right;   URETEROSCOPY WITH HOLMIUM  LASER LITHOTRIPSY Right 05/26/2015   Procedure: URETEROSCOPY WITH HOLMIUM LASER LITHOTRIPSY;  Surgeon: Hollice Espy, MD;  Location: [facility] ORS;  Service: Urology;  Laterality: Right;   URETEROSCOPY WITH HOLMIUM LASER LITHOTRIPSY Right 06/25/2016   Procedure: URETEROSCOPY WITH HOLMIUM LASER LITHOTRIPSY;  Surgeon: Nickie Retort, MD;  Location: [facility] ORS;  Service: Urology;  Laterality: Right;    Allergies: Patient has no known allergies.  Medications: Prior to Admission medications   Medication Sig Start Date End Date Taking? Authorizing Provider  alendronate (FOSAMAX) 70 MG tablet One tab every Sunday on empty stomach with a full glass of water. Do not lie down or eat/drink anything else for the next 30 min. 01/24/20  Yes [provider]  amLODipine (NORVASC) 5 MG tablet Take 5 mg by mouth every morning.    Yes [provider]  amoxicillin-clavulanate (AUGMENTIN) 875-125 MG tablet Take 1 tablet by mouth 2 (two) times daily. 10/23/20  Yes [provider]  atorvastatin (LIPITOR) 80 MG tablet Take 80 mg by mouth daily. 07/31/20  Yes [provider]  buPROPion (WELLBUTRIN XL) 150 MG 24 hr tablet Take 150 mg by mouth daily.   Yes [provider]  cholecalciferol (VITAMIN D) 1000 units tablet Take 2,000 Units by mouth daily.   Yes [provider]  Cranberry-Vitamin C-Probiotic (AZO CRANBERRY PO) Take 1 tablet by mouth daily.   Yes [provider]  levothyroxine (SYNTHROID, LEVOTHROID) 150 MCG tablet Take 150 mcg by mouth at bedtime.    Yes [provider]  losartan (COZAAR) 100 MG tablet Take 100 mg by mouth every morning.    Yes [provider]  metoprolol succinate (TOPROL-XL) 25 MG 24 hr tablet Take 25 mg by mouth every morning.    Yes [provider]  omeprazole (PRILOSEC) 20 MG capsule Take 20 mg by mouth daily before breakfast.    Yes [provider]  predniSONE (DELTASONE) 10 MG tablet Take 10 mg  by mouth daily. 11/10/20  Yes [provider]  traMADol (ULTRAM) 50 MG tablet Take 1 tablet (50 mg total) by mouth every 8 (eight) hours as needed. 11/14/20  Yes Cammie Sickle, MD  buPROPion (WELLBUTRIN SR) 150 MG 12 hr tablet Take 1 tablet by mouth 2 (two) times daily. Patient not taking: Reported on 11/25/2020 10/25/19   [provider]  conjugated estrogens (PREMARIN) vaginal cream Place 1 Applicatorful vaginally daily. Use pea sized amount M-W-Fr before bedtime Patient not taking: Reported on 11/25/2020 12/14/17   Hollice Espy, MD  lovastatin (MEVACOR) 40 MG tablet Take 40 mg by mouth at bedtime. Patient not taking: Reported on 11/25/2020    [provider]     Family History  Problem Relation Age of Onset   CAD Mother    Heart disease Mother    Dementia Father    Bladder Cancer Neg Hx    Prostate cancer Neg Hx    Kidney cancer Neg Hx    Breast cancer Neg Hx     Social History   Socioeconomic History   Marital status: Divorced    Spouse name: Not on file   Number of children: Not on file   Years of education: Not on file   Highest education level: Not on file  Occupational History   Not on file  Tobacco Use   Smoking status: Every Day    Packs/day:  1.00    Years: 40.00    Pack years: 40.00    Types: Cigarettes   Smokeless tobacco: Never  Vaping Use   Vaping Use: Never used  Substance and Sexual Activity   Alcohol use: Yes    Comment: socially   Drug use: No   Sexual activity: Not Currently    Birth control/protection: None  Other Topics Concern   Not on file  Social History Narrative   Smoking: 1p 1-2 days since 17 years; alcohol: rare; 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     Social Determinants of Health   Financial Resource Strain: Not on file  Food Insecurity: Not on file  Transportation Needs: Not on file  Physical Activity: Not on file   Stress: Not on file  Social Connections: Not on file   Review of Systems: A 12 point ROS discussed and pertinent positives are indicated in the HPI above.  All other systems are negative.  Review of Systems  Vital Signs: BP (!) 107/48   Pulse 80   Temp 98.2 F (36.8 C) (Oral)   Resp 18   Ht 5\' 6"  (1.676 m)   Wt 190 lb (86.2 kg)   SpO2 98%   BMI 30.67 kg/m   Physical Exam Constitutional:      Appearance: Normal appearance.  HENT:     Head: Normocephalic and atraumatic.  Cardiovascular:     Rate and Rhythm: Normal rate and regular rhythm.  Pulmonary:     Effort: Pulmonary effort is normal. No respiratory distress.  Skin:    General: Skin is warm and dry.  Neurological:     Mental Status: She is alert and oriented to person, place, and time.  Psychiatric:        Behavior: Behavior normal.        Thought Content: Thought content normal.    Imaging: MR Brain W Wo Contrast  Result Date: 11/17/2020 CLINICAL DATA:  Mass of lower lobe of left lung. Metastatic disease evaluation; recent headaches. EXAM: MRI HEAD WITHOUT AND WITH CONTRAST TECHNIQUE: Multiplanar, multiecho pulse sequences of the brain and surrounding structures were obtained without and with intravenous contrast. CONTRAST:  7.2mL GADAVIST GADOBUTROL 1 MMOL/ML IV SOLN COMPARISON:  Head CT 08/13/2017. PET-CT 11/05/2020. FINDINGS: Brain: Mild generalized parenchymal atrophy. Asymmetric left hippocampal volume loss and T2 FLAIR hyperintense signal abnormality (series 15, image 21) (series 17, image 14). Mild multifocal T2 FLAIR hyperintense signal abnormality within the cerebral white matter, nonspecific but compatible with chronic small vessel ischemic disease. There is no acute infarct. No extra-axial fluid collection. No midline shift. Apparent punctate focus of enhancement within the right cerebellar hemisphere, appreciated on the coronal T1 weighted postcontrast sequence only (series 19, image 9). Vascular: Maintained  flow voids within the proximal large arterial vessels. Skull and upper cervical spine: No focal suspicious marrow lesion. Sinuses/Orbits: Visualized orbits show no acute finding. Trace bilateral ethmoid sinus mucosal thickening. Impressions #1 and #2 will be called to the ordering clinician or representative by the Radiologist Assistant, and communication documented in the PACS or [employer]. IMPRESSION: Apparent punctate focus of enhancement within the right cerebellar hemisphere, appreciated on the coronal T1 weighted postcontrast sequence only. While this may reflect a focus of vascular enhancement or artifact, a punctate metastasis cannot be excluded. Short-interval 4-6 week brain MRI follow-up with contrast recommended. Asymmetric left hippocampal volume loss and T2 FLAIR hyperintense signal abnormality. If the patient has a history of seizures,  this may reflect mesial temporal sclerosis. Alternatively, this may reflect sequela of a nonspecific remote insult. Mild chronic small-vessel ischemic changes within the cerebral white matter. Mild generalized parenchymal atrophy. Electronically Signed   By: Kellie Simmering D.O.   On: 11/17/2020 08:57   NM PET Image Initial (PI) Skull Base To Thigh (F-18 FDG)  Result Date: 11/06/2020 CLINICAL DATA:  Initial treatment strategy for pulmonary nodule. EXAM: NUCLEAR MEDICINE PET SKULL BASE TO THIGH TECHNIQUE: 10.1 mCi F-18 FDG was injected intravenously. Full-ring PET imaging was performed from the skull base to thigh after the radiotracer. CT data was obtained and used for attenuation correction and anatomic localization. Fasting blood glucose: 120 mg/dl COMPARISON:  CT abdomen and pelvis dated October 23, 2020; CT chest dated June 06, 2019 FINDINGS: Mediastinal blood pool activity: SUV max 2.3 Liver activity: SUV max 2.9 NECK: Nodule of the left parotid gland measuring 4 mm on series 3, image 38 with a SUV max of below mediastinal blood pool, favored to be benign.  No hypermetabolic lymph nodes in the neck. Incidental CT findings: none CHEST: Hypermetabolic subcarinal and left hilar lymph nodes. Reference subcarinal lymph node measuring 1.4 cm in short axis series 3, image 98 with an SUV max of 12.7. Reference left hilar lymph node measuring 1.3 cm in short axis on series 3, image 102 with an SUV max of 10.5. Left lower solid lobe nodule measuring 2.3 x 1.8 cm on series 3 image 133 within SUV max of 7.3, when accounting for misregistration artifact, unchanged in size compared with most recent prior CT. Numerous hypermetabolic nodules of the lower left pleura are seen and unchanged in size compared to most recent prior CT. Reference nodule measures 4.2 x 1.4 cm on series 3, image 148 with an SUV max of 10.1. Additional bilateral subsolid pulmonary nodules are seen which are unchanged compared to June 06, 2019 CT, and too small to characterize for FDG avidity. Reference nodule of the right lower lobe on series 3, image 102 measuring 1.1 cm. Incidental CT findings: Normal heart size. No pericardial effusion. Calcifications of the LAD and RCA. Moderate hiatal hernia. Surgically absent thyroid. ABDOMEN/PELVIS: No abnormal hypermetabolic activity within the liver, pancreas, adrenal glands, or spleen. No hypermetabolic lymph nodes in the abdomen or pelvis. Incidental CT findings: Low-density lesion of the right hepatic lobe with no FDG avidity, likely a simple cyst. Cholelithiasis with no gallbladder wall thickening. No biliary ductal dilation. Pancreas spleen, and bilateral adrenal glands are unremarkable. No hydronephrosis bilaterally bilateral nonobstructing renal stones. Bilateral low-density renal lesions with no FDG avidity, likely simple cysts. Bladder is decompressed. Diverticula of the descending and sigmoid colon. No evidence of bowel wall thickening or obstruction. Normal appendix. SKELETON: No focal hypermetabolic activity to suggest skeletal metastasis. Incidental CT  findings: none IMPRESSION: Hypermetabolic solid pulmonary nodule of the left lower lobe, favor primary lung malignancy. Numerous hypermetabolic nodules of the lower left pleura, compatible with pleural metastatic disease. Hypermetabolic subcarinal and left hilar lymph nodes, compatible with metastatic disease. 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 Recommend attention on follow-up. Mild asymmetric FDG uptake below mediastinal blood pool within a small nodule of the left parotid gland, favored to be benign. Recommend attention on follow-up. No evidence of metastatic disease in the abdomen or pelvis. No evidence of osseous metastatic disease. Electronically Signed  By: Yetta Glassman M.D.   On: 11/06/2020 10:06    Labs:  CBC: Recent Labs    11/25/20 0902  WBC 13.9*  HGB 12.9  HCT 37.9  PLT 231    COAGS: Recent Labs    11/25/20 0902  INR 0.9    BMP: Recent Labs    10/23/20 1224  CREATININE 0.70    Assessment and Plan: 65 year old female with history of tobacco use and shortness of breath with left sided abdomen/chest pain.  Imaging done revealed hypermetabolic solid pulmonary nodule of the left lower lobe, numerous hypermetabolic nodules of the lower left pleura, hypermetabolic subcarinal and left hilar lymph nodes. Patient has been seen by Oncology and Pulmonary and request received for image guided lung biopsy.   The patient has been NPO, no blood thinners taken, imaging, labs and vitals have been reviewed.  Risks and benefits of CT guided left pleural lung nodule biopsy was discussed with the patient including, but not limited to bleeding, hemoptysis, respiratory failure requiring intubation, infection, pneumothorax requiring chest tube placement.  All of the patient's questions were  answered and the patient is agreeable to proceed.  Consent signed and in chart.   Thank you for this interesting consult.  I greatly enjoyed meeting Felicia Manning and look forward to participating in their care.  A copy of this report was sent to the requesting provider on this date.  Electronically Signed: Hedy Jacob, PA-C 11/25/2020, 10:50 AM   I spent a total of 15 Minutes in face to face in clinical consultation, greater than 50% of which was counseling/coordinating care for left pleural mass.

## 2020-11-27 ENCOUNTER — Telehealth: Payer: Self-pay | Admitting: *Deleted

## 2020-11-27 ENCOUNTER — Encounter: Payer: Self-pay | Admitting: *Deleted

## 2020-11-27 LAB — SURGICAL PATHOLOGY

## 2020-11-27 NOTE — Telephone Encounter (Signed)
Foundation one testing submitted per v/o Dr. Rogue Bussing.

## 2020-12-01 ENCOUNTER — Inpatient Hospital Stay: Payer: 59 | Attending: Internal Medicine

## 2020-12-02 ENCOUNTER — Inpatient Hospital Stay: Payer: 59 | Attending: Internal Medicine | Admitting: Internal Medicine

## 2020-12-02 ENCOUNTER — Encounter: Payer: Self-pay | Admitting: *Deleted

## 2020-12-02 ENCOUNTER — Other Ambulatory Visit: Payer: Self-pay

## 2020-12-02 DIAGNOSIS — C3432 Malignant neoplasm of lower lobe, left bronchus or lung: Secondary | ICD-10-CM | POA: Diagnosis not present

## 2020-12-02 DIAGNOSIS — Z85850 Personal history of malignant neoplasm of thyroid: Secondary | ICD-10-CM | POA: Diagnosis not present

## 2020-12-02 DIAGNOSIS — C771 Secondary and unspecified malignant neoplasm of intrathoracic lymph nodes: Secondary | ICD-10-CM | POA: Diagnosis not present

## 2020-12-02 DIAGNOSIS — J449 Chronic obstructive pulmonary disease, unspecified: Secondary | ICD-10-CM | POA: Insufficient documentation

## 2020-12-02 DIAGNOSIS — E119 Type 2 diabetes mellitus without complications: Secondary | ICD-10-CM | POA: Insufficient documentation

## 2020-12-02 DIAGNOSIS — Z5111 Encounter for antineoplastic chemotherapy: Secondary | ICD-10-CM | POA: Diagnosis present

## 2020-12-02 DIAGNOSIS — R0789 Other chest pain: Secondary | ICD-10-CM | POA: Insufficient documentation

## 2020-12-02 DIAGNOSIS — Z79899 Other long term (current) drug therapy: Secondary | ICD-10-CM | POA: Insufficient documentation

## 2020-12-02 MED ORDER — LIDOCAINE-PRILOCAINE 2.5-2.5 % EX CREA: 1.0000 "application " | TOPICAL_CREAM | CUTANEOUS | 0 refills | Status: DC | PRN

## 2020-12-02 MED ORDER — DEXAMETHASONE 4 MG PO TABS: ORAL_TABLET | ORAL | 0 refills | Status: DC

## 2020-12-02 MED ORDER — HYDROCODONE-ACETAMINOPHEN 5-325 MG PO TABS: 1.0000 | ORAL_TABLET | Freq: Three times a day (TID) | ORAL | 0 refills | Status: DC | PRN

## 2020-12-02 MED ORDER — FOLIC ACID 1 MG PO TABS: 1.0000 mg | ORAL_TABLET | Freq: Every day | ORAL | 2 refills | Status: DC

## 2020-12-02 MED ORDER — ONDANSETRON HCL 8 MG PO TABS
8.0000 mg | ORAL_TABLET | Freq: Three times a day (TID) | ORAL | 2 refills | Status: DC | PRN
Start: 2020-12-02 — End: 2021-04-28

## 2020-12-02 MED ORDER — PROCHLORPERAZINE MALEATE 10 MG PO TABS
10.0000 mg | ORAL_TABLET | Freq: Four times a day (QID) | ORAL | 2 refills | Status: DC | PRN
Start: 2020-12-02 — End: 2021-04-28

## 2020-12-02 NOTE — Progress Notes (Signed)
Patient states that she is having lots of pain in her left ribcage and that the tramadol doesn't really help. It keeps her awake a night.

## 2020-12-02 NOTE — Assessment & Plan Note (Addendum)
#  Stage IV -non-small cell favor adenocarcinoma; September 2022 PET scan - hypermetabolic solid pulmonary nodule of the left lower lobe, favor primary lung malignancy; numerous hypermetabolic nodules left lower lobe pleura; subcarinal/left hilar lymph nodes-positive for metastatic disease.  Bilateral sub-solid nodules compared to CT April 2021.  No evidence of abdomen pelvis metastases or bone metastasis.  NGS pending.  # I reviewed the stage as stage-IV pathology as non-small cell /adenocarcinoma with patient. Also reviewed the images in detail.  #Recommend palliative chemotherapy-carbo Alimta plus [employer] insurance approval]. Discussed the potential side effects including but not limited to-increasing fatigue, nausea vomiting, diarrhea, hair loss, sores in the mouth, increase risk of infection and also neuropathy.    I discussed the mechanism of action; The goal of therapy is palliative; and length of treatments are likely ongoing/based upon the results of the scans. Discussed the potential side effects of immunotherapy including but not limited to diarrhea; skin rash; elevated LFTs/endocrine abnormalities etc.  #MRI brain-punctate lesion-asymptomatic no evidence of any gross disease.  Discussed with Dr.Vaslow; will repeat short-term imaging in 2 months or so.  # left chest wall pain:prednisone 10 mg/day.  Not well controlled.  Recommend 50 mg tramadaol every 8 hours as needed   #COPD: Noncompliant with inhalers.  Recommend follow up with pulmonary.   #Diabetes-A1c 6.9 [AUG 2022]-not on medications; diet controlled. Patient will likely need medications.  DISPOSITION: # chemo ed ASAP; b12 injection # port referral  # 1 week- MDL labs- cbc/cmp; Carbo-Alimta-Keytruda [new]- Dr.B  # 40 minutes face-to-face with the patient discussing the above plan of care; more than 50% of time spent on prognosis/ natural history; counseling and coordination.   Cc:

## 2020-12-02 NOTE — Progress Notes (Addendum)
[facility] CONSULT NOTE  Patient Care Team: Dion Body, MD as PCP - General (Family Medicine) Leata Mouse (Inactive) Byrnett, Forest Gleason, MD (General Surgery) Telford Nab, RN as Oncology Nurse Navigator  CHIEF COMPLAINTS/PURPOSE OF CONSULTATION: lung cancer  Oncology History Overview Note  IMPRESSION: Hypermetabolic solid pulmonary nodule of the left lower lobe, favor primary lung malignancy.   Numerous hypermetabolic nodules of the lower left pleura, compatible with pleural metastatic disease.   Hypermetabolic subcarinal and left hilar lymph nodes, compatible with metastatic disease.   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 Recommend attention on follow-up.   Mild asymmetric FDG uptake below mediastinal blood pool within a small nodule of the left parotid gland, favored to be benign. Recommend attention on follow-up.   No evidence of metastatic disease in the abdomen or pelvis.   No evidence of osseous metastatic disease.   ---------------------  # MARCH 2022- incidental LLL [ 0.9 cm] s/p COVID vaccine; [Dr.Aleskerov]; July 2022- left pleural pain-   COPD-non-complaint    ------------------   DIAGNOSIS:  A. PLEURAL MASS, LEFT CHEST; BIOPSY:  - DIAGNOSTIC OF MALIGNANCY.  - NON-SMALL CELL CARCINOMA, FAVOR ADENOCARCINOMA.   Comment:  The tumor cells are positive for CK7 and TTF1 (patchy).  They are  negative for p40.    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[place])  05/02/2015 Initial Diagnosis   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HCC)   Cancer of lower lobe of left lung ([place])  12/02/2020 Initial Diagnosis   Cancer of lower lobe of left lung ([place])   12/10/2020 Cancer Staging   Staging form: Lung, AJCC 8th Edition - Clinical: Stage IVA  (cT4, cN3, cM1a) - Signed by Cammie Sickle, MD on 12/10/2020    12/12/2020 -  Chemotherapy   Patient is on Treatment Plan : LUNG CARBOplatin / Pemetrexed / Pembrolizumab q21d Induction x 4 cycles / Maintenance Pemetrexed + Pembrolizumab        HISTORY OF PRESENTING ILLNESS: Ambulating independently.  Alone. Felicia Manning 65 y.o.  female history of smoking is here today with results of her biopsy/PET scan/MRI brain..  Patient is currently on prednisone 10 mg a day for pain; also taking Tylenol to every 4-6 hours.  Patient complains of difficulty sleeping because of pain.  Otherwise denies any nausea vomiting headaches.  Review of Systems  Constitutional:  Negative for chills, diaphoresis, fever, malaise/fatigue and weight loss.  HENT:  Negative for nosebleeds and sore throat.   Eyes:  Negative for double vision.  Respiratory:  Positive for cough, sputum production and wheezing. Negative for hemoptysis and shortness of breath.   Cardiovascular:  Positive for chest pain. Negative for palpitations, orthopnea and leg swelling.  Gastrointestinal:  Negative for abdominal pain, blood in stool, constipation, diarrhea, heartburn, melena, nausea and vomiting.  Genitourinary:  Negative for dysuria, frequency and urgency.  Musculoskeletal:  Negative for back pain and joint pain.  Skin: Negative.  Negative for itching and rash.  Neurological:  Positive for headaches. Negative for dizziness, tingling, focal weakness and weakness.  Endo/Heme/Allergies:  Does not bruise/bleed easily.  Psychiatric/Behavioral:  Negative for depression. The patient is not nervous/anxious and does not have insomnia.     MEDICAL HISTORY:  Past Medical History:  Diagnosis Date   Barrett's esophagus    Dysrhythmia    stress related at work.  GERD (gastroesophageal reflux disease)    Hepatitis C 2008   Hyperlipidemia    Hypertension    Hypothyroidism    Nephrolithiasis    Non-small cell carcinoma of left  lung (HCC)    Osteoporosis    Pulmonary mass    Thyroid cancer ([place]) 2012   Thyroid cancer ([place])     SURGICAL HISTORY: Past Surgical History:  Procedure Laterality Date   ABDOMINAL HYSTERECTOMY  2006   COLONOSCOPY WITH PROPOFOL N/A 04/25/2015   Procedure: COLONOSCOPY WITH PROPOFOL;  Surgeon: Josefine Class, MD;  Location: [facility] ENDOSCOPY;  Service: Endoscopy;  Laterality: N/A;   CYSTOSCOPY W/ RETROGRADES Bilateral 05/19/2015   Procedure: CYSTOSCOPY WITH RETROGRADE PYELOGRAM;  Surgeon: Hollice Espy, MD;  Location: [facility] ORS;  Service: Urology;  Laterality: Bilateral;   CYSTOSCOPY W/ URETERAL STENT PLACEMENT Right 05/26/2015   Procedure: CYSTOSCOPY WITH STENT REPLACEMENT;  Surgeon: Hollice Espy, MD;  Location: [facility] ORS;  Service: Urology;  Laterality: Right;   CYSTOSCOPY WITH STENT PLACEMENT Right 05/19/2015   Procedure: CYSTOSCOPY WITH STENT PLACEMENT;  Surgeon: Hollice Espy, MD;  Location: [facility] ORS;  Service: Urology;  Laterality: Right;   CYSTOSCOPY WITH STENT PLACEMENT Right 05/26/2015   Procedure: CYSTOSCOPY WITH STENT PLACEMENT;  Surgeon: Hollice Espy, MD;  Location: [facility] ORS;  Service: Urology;  Laterality: Right;   CYSTOSCOPY WITH STENT PLACEMENT Right 06/25/2016   Procedure: CYSTOSCOPY WITH STENT PLACEMENT;  Surgeon: Nickie Retort, MD;  Location: [facility] ORS;  Service: Urology;  Laterality: Right;   CYSTOSCOPY WITH STENT PLACEMENT Left 10/31/2017   Procedure: CYSTOSCOPY WITH STENT PLACEMENT;  Surgeon: Hollice Espy, MD;  Location: [facility] ORS;  Service: Urology;  Laterality: Left;   CYSTOSCOPY/URETEROSCOPY/HOLMIUM LASER/STENT PLACEMENT Left 07/23/2015   Procedure: CYSTOSCOPY/URETEROSCOPY/HOLMIUM LASER/STENT PLACEMENT/RETROGRADE PYELOGRAM;  Surgeon: Hollice Espy, MD;  Location: [facility] ORS;  Service: Urology;  Laterality: Left;   CYSTOSCOPY/URETEROSCOPY/HOLMIUM LASER/STENT PLACEMENT Left 11/16/2017   Procedure: CYSTOSCOPY/URETEROSCOPY/HOLMIUM LASER/STENT Exchange;  Surgeon: Hollice Espy, MD;  Location: [facility] ORS;  Service: Urology;  Laterality: Left;   ESOPHAGOGASTRODUODENOSCOPY (EGD) WITH PROPOFOL N/A 04/25/2015   Procedure: ESOPHAGOGASTRODUODENOSCOPY (EGD) WITH PROPOFOL;  Surgeon: Josefine Class, MD;  Location[facility] ENDOSCOPY;  Service: Endoscopy;  Laterality: N/A;   EYE SURGERY Bilateral 1964   lazy eye repair   EYE SURGERY Bilateral 2001   lazy eye repair   IR IMAGING GUIDED PORT INSERTION  12/08/2020   LITHOTRIPSY  2009   THYROIDECTOMY  2010   URETEROSCOPY WITH HOLMIUM LASER LITHOTRIPSY Right 05/19/2015   Procedure: URETEROSCOPY WITH HOLMIUM LASER LITHOTRIPSY;  Surgeon: Hollice Espy, MD;  Location: [facility] ORS;  Service: Urology;  Laterality: Right;   URETEROSCOPY WITH HOLMIUM LASER LITHOTRIPSY Right 05/26/2015   Procedure: URETEROSCOPY WITH HOLMIUM LASER LITHOTRIPSY;  Surgeon: Hollice Espy, MD;  Location: [facility] ORS;  Service: Urology;  Laterality: Right;   URETEROSCOPY WITH HOLMIUM LASER LITHOTRIPSY Right 06/25/2016   Procedure: URETEROSCOPY WITH HOLMIUM LASER LITHOTRIPSY;  Surgeon: Nickie Retort, MD;  Location: [facility] ORS;  Service: Urology;  Laterality: Right;    SOCIAL HISTORY: Social History   Socioeconomic History   Marital status: Divorced    Spouse name: Not on file   Number of children: Not on file   Years of education: Not on file   Highest education level: Not on file  Occupational History   Not on file  Tobacco Use   Smoking status: Every Day    Packs/day: 1.00    Years: 40.00    Pack years: 40.00    Types: Cigarettes  Smokeless tobacco: Never  Vaping Use   Vaping Use: Never used  Substance and Sexual Activity   Alcohol use: Yes    Comment: socially   Drug use: No   Sexual activity: Not Currently    Birth control/protection: None  Other Topics Concern   Not on file  Social History Narrative   Smoking: 1p 1-2 days since 17 years; alcohol: rare; 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     Social Determinants of Health   Financial Resource Strain: Not on file  Food Insecurity: Not on file  Transportation Needs: Not on file  Physical Activity: Not on file  Stress: Not on file  Social Connections: Not on file  Intimate Partner Violence: Not on file    FAMILY HISTORY: Family History  Problem Relation Age of Onset   CAD Mother    Heart disease Mother    Dementia Father    Bladder Cancer Neg Hx    Prostate cancer Neg Hx    Kidney cancer Neg Hx    Breast cancer Neg Hx     ALLERGIES:  has No Known Allergies.  MEDICATIONS:  Current Outpatient Medications  Medication Sig Dispense Refill   alendronate (FOSAMAX) 70 MG tablet One tab every Sunday on empty stomach with a full glass of water. Do not lie down or eat/drink anything else for the next 30 min.     amLODipine (NORVASC) 5 MG tablet Take 5 mg by mouth every morning.      amoxicillin-clavulanate (AUGMENTIN) 875-125 MG tablet Take 1 tablet by mouth 2 (two) times daily.     atorvastatin (LIPITOR) 80 MG tablet Take 80 mg by mouth daily.     cholecalciferol (VITAMIN D) 1000 units tablet Take 2,000 Units by mouth daily.     Cranberry-Vitamin C-Probiotic (AZO CRANBERRY PO) Take 1 tablet by mouth daily.     dexamethasone (DECADRON) 4 MG tablet Take 1 tablet by mouth twice a day the day before chemotherapy 30 tablet 0   folic acid (FOLVITE) 1 MG tablet Take 1 tablet (1 mg total) by mouth daily. 30 tablet 2   HYDROcodone-acetaminophen (NORCO/VICODIN) 5-325 MG tablet Take 1 tablet by mouth every 8 (eight) hours as needed for moderate pain. 60 tablet 0   levothyroxine (SYNTHROID, LEVOTHROID) 150 MCG tablet Take 150 mcg by mouth at bedtime.      lidocaine-prilocaine (EMLA) cream Apply 1 application topically as needed. 30 g 0   losartan (COZAAR) 100 MG tablet Take 100 mg by mouth every morning.      metoprolol succinate (TOPROL-XL) 25 MG 24 hr tablet Take 25 mg by mouth every morning.    1   omeprazole (PRILOSEC) 20 MG capsule Take 20 mg by mouth daily before breakfast.      ondansetron (ZOFRAN) 8 MG tablet Take 1 tablet (8 mg total) by mouth every 8 (eight) hours as needed for nausea or vomiting. Start on the third day after chemotherapy 30 tablet 2   predniSONE (DELTASONE) 10 MG tablet Take 10 mg by mouth daily.     prochlorperazine (COMPAZINE) 10 MG tablet Take 1 tablet (10 mg total) by mouth every 6 (six) hours as needed for nausea or vomiting. 30 tablet 2   traMADol (ULTRAM) 50 MG tablet Take 1 tablet (50 mg total) by mouth every 8 (eight) hours as needed. 60 tablet 0   No current facility-administered medications for this visit.      Marland Kitchen  PHYSICAL EXAMINATION: ECOG PERFORMANCE STATUS: 1 - Symptomatic but completely ambulatory  Vitals:   12/02/20 0838  BP: 130/76  Pulse: 63  Resp: 18  Temp: 97.7 F (36.5 C)  SpO2: 97%   Filed Weights   12/02/20 0835  Weight: 195 lb (88.5 kg)    Physical Exam Vitals and nursing note reviewed.  HENT:     Head: Normocephalic and atraumatic.     Mouth/Throat:     Pharynx: Oropharynx is clear.  Eyes:     Extraocular Movements: Extraocular movements intact.     Pupils: Pupils are equal, round, and reactive to light.  Cardiovascular:     Rate and Rhythm: Normal rate and regular rhythm.  Pulmonary:     Comments: Decreased breath sounds bilaterally.  Abdominal:     Palpations: Abdomen is soft.  Musculoskeletal:        General: Normal range of motion.     Cervical back: Normal range of motion.  Skin:    General: Skin is warm.  Neurological:     General: No focal deficit present.     Mental Status: She is alert and oriented to person, place, and time.  Psychiatric:        Behavior: Behavior normal.        Judgment: Judgment normal.     LABORATORY DATA:  I have reviewed the data as listed Lab Results  Component Value Date   WBC 17.5 (H) 12/10/2020   HGB 13.3 12/10/2020   HCT 40.5 12/10/2020   MCV 90.0  12/10/2020   PLT 278 12/10/2020   Recent Labs    10/23/20 1224  CREATININE 0.70    RADIOGRAPHIC STUDIES: I have personally reviewed the radiological images as listed and agreed with the findings in the report. MR Brain W Wo Contrast  Result Date: 11/17/2020 CLINICAL DATA:  Mass of lower lobe of left lung. Metastatic disease evaluation; recent headaches. EXAM: MRI HEAD WITHOUT AND WITH CONTRAST TECHNIQUE: Multiplanar, multiecho pulse sequences of the brain and surrounding structures were obtained without and with intravenous contrast. CONTRAST:  7.56mL GADAVIST GADOBUTROL 1 MMOL/ML IV SOLN COMPARISON:  Head CT 08/13/2017. PET-CT 11/05/2020. FINDINGS: Brain: Mild generalized parenchymal atrophy. Asymmetric left hippocampal volume loss and T2 FLAIR hyperintense signal abnormality (series 15, image 21) (series 17, image 14). Mild multifocal T2 FLAIR hyperintense signal abnormality within the cerebral white matter, nonspecific but compatible with chronic small vessel ischemic disease. There is no acute infarct. No extra-axial fluid collection. No midline shift. Apparent punctate focus of enhancement within the right cerebellar hemisphere, appreciated on the coronal T1 weighted postcontrast sequence only (series 19, image 9). Vascular: Maintained flow voids within the proximal large arterial vessels. Skull and upper cervical spine: No focal suspicious marrow lesion. Sinuses/Orbits: Visualized orbits show no acute finding. Trace bilateral ethmoid sinus mucosal thickening. Impressions #1 and #2 will be called to the ordering clinician or representative by the Radiologist Assistant, and communication documented in the PACS or [employer]. IMPRESSION: Apparent punctate focus of enhancement within the right cerebellar hemisphere, appreciated on the coronal T1 weighted postcontrast sequence only. While this may reflect a focus of vascular enhancement or artifact, a punctate metastasis cannot be excluded.  Short-interval 4-6 week brain MRI follow-up with contrast recommended. Asymmetric left hippocampal volume loss and T2 FLAIR hyperintense signal abnormality. If the patient has a history of seizures, this may reflect mesial temporal sclerosis. Alternatively, this may reflect sequela of a nonspecific remote insult. Mild chronic small-vessel ischemic changes within the cerebral white  matter. Mild generalized parenchymal atrophy. Electronically Signed   By: Kellie Simmering D.O.   On: 11/17/2020 08:57   CT BIOPSY  Result Date: 11/25/2020 CLINICAL DATA:  Left lower lobe lung mass, left-sided pleural masses and metastatic lymphadenopathy in the left hilum and mediastinum by prior imaging. Pleural masses including inferolateral masses abutting left-sided ribs are hypermetabolic by PET and the patient presents for pleural mass biopsy. EXAM: CT GUIDED CORE BIOPSY OF LEFT PLEURAL MASS ANESTHESIA/SEDATION: Moderate (conscious) sedation was employed during this procedure. A total of Versed 1.0 mg and Fentanyl 50 mcg was administered intravenously by radiology nursing under my direct supervision. Moderate Sedation Time: 19 minutes. The patient's level of consciousness and vital signs were monitored continuously by radiology nursing throughout the procedure under my direct supervision. PROCEDURE: The procedure risks, benefits, and alternatives were explained to the patient. Questions regarding the procedure were encouraged and answered. The patient understands and consents to the procedure. A time-out was performed prior to initiating the procedure. CT was performed in a supine position with the left side rolled up slightly through the lower chest and upper abdomen. The left lower lateral chest wall and contiguous upper abdominal wall was prepped with chlorhexidine in a sterile fashion, and a sterile drape was applied covering the operative field. A sterile gown and sterile gloves were used for the procedure. Local anesthesia  was provided with 1% Lidocaine. Left inferolateral pleural mass was targeted. Under CT guidance, a 17 gauge trocar needle was advanced to the level of a mass. After confirming needle tip position, coaxial 18 gauge core biopsy samples were obtained. Three separate core biopsy samples were obtained and placed in formalin. Additional CT imaging was performed after outer needle removal. COMPLICATIONS: None FINDINGS: Based on the PET scan, the plaque like pleural mass in the far inferolateral left pleural space abutting the distal aspect of the left ninth rib was targeted in an area soft tissue thickening demonstrating abnormal hypermetabolism on the PET scan. The area of pleural mass measures roughly 1.5 x 4.2 cm in transverse dimensions on axial CT. Solid tissue was obtained. There were no complications evident on immediate postprocedural CT. IMPRESSION: CT-guided core biopsy of left pleural mass in the far inferolateral pleural space abutting the distal aspect of the left ninth rib. Solid tissue was obtained. Electronically Signed   By: Aletta Edouard M.D.   On: 11/25/2020 14:08   IR IMAGING GUIDED PORT INSERTION  Result Date: 12/08/2020 INDICATION: IV access for chemotherapy EXAM: Ultrasound-guided puncture of the right internal jugular vein Placement of a right-sided chest port using fluoroscopic guidance MEDICATIONS: None ANESTHESIA/SEDATION: Moderate (conscious) sedation was employed during this procedure. A total of Versed 2 mg and Fentanyl 100 mcg was administered intravenously. Moderate Sedation Time: 20 minutes. The patient's level of consciousness and vital signs were monitored continuously by radiology nursing throughout the procedure under my direct supervision. FLUOROSCOPY TIME:  Fluoroscopy Time: 36 seconds with 2 exposures COMPLICATIONS: None immediate. PROCEDURE: Informed written consent was obtained from the patient after a thorough discussion of the procedural risks, benefits and alternatives.  All questions were addressed. Maximal Sterile Barrier Technique was utilized including caps, mask, sterile gowns, sterile gloves, sterile drape, hand hygiene and skin antiseptic. A timeout was performed prior to the initiation of the procedure. The patient was placed supine on the exam table. The right neck and chest was prepped and draped in the standard sterile fashion. A preliminary ultrasound of the right neck was performed and demonstrates a patent right  internal jugular vein. A permanent ultrasound image was stored in the electronic medical record. The overlying skin was anesthetized with 1% Lidocaine. Using ultrasound guidance, access was obtained into the right internal jugular vein using a 21 gauge micropuncture set. A wire was advanced into the SVC, a short incision was made at the puncture site, and serial dilatation performed. Next, in an ipsilateral infraclavicular location, an incision was made at the site of the subcutaneous reservoir. Blunt dissection was used to open a pocket to contain the reservoir. A subcutaneous tunnel was then created from the port site to the puncture site. A(n) 8 Fr single lumen catheter was advanced through the tunnel. The catheter was attached to the port and this was placed in the subcutaneous pocket. Under fluoroscopic guidance, a peel away sheath was placed, and the catheter was trimmed to the appropriate length and was advanced into the central veins. The catheter length is 23 cm. The tip of the catheter lies near the superior cavoatrial junction. The port flushes and aspirates appropriately. The port was flushed and locked with heparinized saline. The port pocket was closed in 2 layers using 3-0 and 4-0 Vicryl/absorbable suture. Dermabond was also applied to both incisions. The patient tolerated the procedure well and was transferred to recovery in stable condition. IMPRESSION: Successful placement of a right-sided chest port using the right internal jugular vein. The  port is ready for immediate use. Electronically Signed   By: Albin Felling M.D.   On: 12/08/2020 10:37    ASSESSMENT & PLAN:   Cancer of lower lobe of left lung ([place]) #Stage IV -non-small cell favor adenocarcinoma; September 2022 PET scan - hypermetabolic solid pulmonary nodule of the left lower lobe, favor primary lung malignancy; numerous hypermetabolic nodules left lower lobe pleura; subcarinal/left hilar lymph nodes-positive for metastatic disease.  Bilateral sub-solid nodules compared to CT April 2021.  No evidence of abdomen pelvis metastases or bone metastasis.  NGS pending.  # I reviewed the stage as stage-IV pathology as non-small cell /adenocarcinoma with patient. Also reviewed the images in detail.  #Recommend palliative chemotherapy-carbo Alimta plus [employer] insurance approval]. Discussed the potential side effects including but not limited to-increasing fatigue, nausea vomiting, diarrhea, hair loss, sores in the mouth, increase risk of infection and also neuropathy.    I discussed the mechanism of action; The goal of therapy is palliative; and length of treatments are likely ongoing/based upon the results of the scans. Discussed the potential side effects of immunotherapy including but not limited to diarrhea; skin rash; elevated LFTs/endocrine abnormalities etc.  #MRI brain-punctate lesion-asymptomatic no evidence of any gross disease.  Discussed with Dr.Vaslow; will repeat short-term imaging in 2 months or so.  # left chest wall pain:prednisone 10 mg/day.  Not well controlled.  Recommend 50 mg tramadaol every 8 hours as needed   #COPD: Noncompliant with inhalers.  Recommend follow up with pulmonary.   #Diabetes-A1c 6.9 [AUG 2022]-not on medications; diet controlled. Patient will likely need medications.  DISPOSITION: # chemo ed ASAP; b12 injection # port referral  # 1 week- MDL labs- cbc/cmp; Carbo-Alimta-Keytruda [new]- Dr.B  # 40 minutes face-to-face with the  patient discussing the above plan of care; more than 50% of time spent on prognosis/ natural history; counseling and coordination.   Cc:       All questions were answered. The patient knows to call the clinic with any problems, questions or concerns.       Cammie Sickle, MD 12/10/2020 8:29 AM

## 2020-12-02 NOTE — Progress Notes (Signed)
Met with patient during follow up visit with Dr. B to review recent biopsy results and discuss treatment options. All questions answered during visit. Reviewed upcoming appts and new prescriptions with pt. Informed that will be called with her appt for port placement once scheduled. Informed that will complete FMLA forms today and fax them in to fax number provided. Instructed pt to call with any further questions or needs. Pt verbalized understanding.

## 2020-12-03 ENCOUNTER — Encounter: Payer: Self-pay | Admitting: Internal Medicine

## 2020-12-03 NOTE — Progress Notes (Signed)
Patient on schedule for port placement 12/08/2020, called and spoke with patient with pre procedure instructions given. Made aware to be here @ 0800, NPO after MN prior to procedure and driver post procedure/recovery/discharge. Stated understanding.

## 2020-12-03 NOTE — Progress Notes (Signed)
Pharmacist Chemotherapy Monitoring - Initial Assessment    Anticipated start date: 12/09/20   The following has been reviewed per standard work regarding the patient's treatment regimen: The patient's diagnosis, treatment plan and drug doses, and organ/hematologic function Lab orders and baseline tests specific to treatment regimen  The treatment plan start date, drug sequencing, and pre-medications Prior authorization status  Patient's documented medication list, including drug-drug interaction screen and prescriptions for anti-emetics and supportive care specific to the treatment regimen The drug concentrations, fluid compatibility, administration routes, and timing of the medications to be used The patient's access for treatment and lifetime cumulative dose history, if applicable  The patient's medication allergies and previous infusion related reactions, if applicable   Changes made to treatment plan:  treatment plan date  Follow up needed:  Pending authorization for treatment  and dose adjustment of carbo due to   and signing treatment plan   Adelina Mings, [place], 12/03/2020  11:58 AM

## 2020-12-03 NOTE — Progress Notes (Signed)
START ON PATHWAY REGIMEN - Non-Small Cell Lung     A cycle is every 21 days:     Pemetrexed      Carboplatin   **Always confirm dose/schedule in your pharmacy ordering system**  Patient Characteristics: Stage IV Metastatic, Nonsquamous, Awaiting Molecular Test Results and Need to Start Chemotherapy, PS = 0, 1 Therapeutic Status: Stage IV Metastatic Histology: Nonsquamous Cell Broad Molecular Profiling Status: Awaiting Molecular Test Results and Need to Start Chemotherapy ECOG Performance Status: 1 Intent of Therapy: Non-Curative / Palliative Intent, Discussed with Patient

## 2020-12-04 ENCOUNTER — Inpatient Hospital Stay: Payer: 59

## 2020-12-04 ENCOUNTER — Other Ambulatory Visit: Payer: Self-pay

## 2020-12-04 ENCOUNTER — Other Ambulatory Visit: Payer: Self-pay | Admitting: Internal Medicine

## 2020-12-04 ENCOUNTER — Encounter: Payer: Self-pay | Admitting: Internal Medicine

## 2020-12-04 ENCOUNTER — Other Ambulatory Visit: Payer: Self-pay | Admitting: *Deleted

## 2020-12-04 DIAGNOSIS — C3432 Malignant neoplasm of lower lobe, left bronchus or lung: Secondary | ICD-10-CM

## 2020-12-04 DIAGNOSIS — Z5111 Encounter for antineoplastic chemotherapy: Secondary | ICD-10-CM | POA: Diagnosis not present

## 2020-12-04 MED ORDER — CYANOCOBALAMIN 1000 MCG/ML IJ SOLN
1000.0000 ug | Freq: Once | INTRAMUSCULAR | Status: AC
  Administered 2020-12-04: 1000 ug via INTRAMUSCULAR
  Filled 2020-12-04: qty 1

## 2020-12-05 ENCOUNTER — Telehealth: Payer: Self-pay | Admitting: *Deleted

## 2020-12-05 ENCOUNTER — Encounter: Payer: Self-pay | Admitting: Internal Medicine

## 2020-12-05 NOTE — Telephone Encounter (Signed)
-----   Message from Ventura Sellers, MD sent at 12/02/2020 11:04 AM EDT ----- Regarding: RE: Brain MRI - recommendations Yes, thanks for reminding me...  Repeat MRI in 2-3 weeks (ie 4-6 weeks after initial study)  We will review the updated study again in our tumor board meeting once it's done.  Thedore Mins ----- Message ----- From: Telford Nab, RN Sent: 12/02/2020  10:57 AM EDT To: Cammie Sickle, MD, Ventura Sellers, MD Subject: Brain MRI - recommendations                    Hi Dr. Mickeal Skinner,   Just wanted to follow up with you to see if you have any further recommendations on this patient and her brain MRI after discussing at tumor board?   Thank you,  Salathiel Ferrara

## 2020-12-08 ENCOUNTER — Encounter: Payer: Self-pay | Admitting: Radiology

## 2020-12-08 ENCOUNTER — Ambulatory Visit
Admission: RE | Admit: 2020-12-08 | Discharge: 2020-12-08 | Disposition: A | Discharge status: Home / Self Care | Payer: 59 | Source: Ambulatory Visit | Attending: Internal Medicine | Admitting: Internal Medicine

## 2020-12-08 ENCOUNTER — Other Ambulatory Visit: Payer: Self-pay

## 2020-12-08 DIAGNOSIS — Z452 Encounter for adjustment and management of vascular access device: Secondary | ICD-10-CM | POA: Insufficient documentation

## 2020-12-08 DIAGNOSIS — C3432 Malignant neoplasm of lower lobe, left bronchus or lung: Secondary | ICD-10-CM | POA: Diagnosis not present

## 2020-12-08 HISTORY — PX: IR IMAGING GUIDED PORT INSERTION: IMG5740

## 2020-12-08 MED ORDER — MIDAZOLAM HCL 2 MG/2ML IJ SOLN
INTRAMUSCULAR | Status: AC
  Filled 2020-12-08: qty 2

## 2020-12-08 MED ORDER — SODIUM CHLORIDE 0.9 % IV SOLN
INTRAVENOUS | Status: DC
  Filled 2020-12-08: qty 1000

## 2020-12-08 MED ORDER — FENTANYL CITRATE (PF) 100 MCG/2ML IJ SOLN
INTRAMUSCULAR | Status: AC | PRN
  Administered 2020-12-08 (×2): 50 ug via INTRAVENOUS

## 2020-12-08 MED ORDER — FENTANYL CITRATE (PF) 100 MCG/2ML IJ SOLN
INTRAMUSCULAR | Status: AC
  Filled 2020-12-08: qty 2

## 2020-12-08 MED ORDER — HEPARIN SOD (PORK) LOCK FLUSH 100 UNIT/ML IV SOLN
INTRAVENOUS | Status: AC
  Administered 2020-12-08: 500 [IU]
  Filled 2020-12-08: qty 5

## 2020-12-08 MED ORDER — MIDAZOLAM HCL 2 MG/2ML IJ SOLN
INTRAMUSCULAR | Status: AC | PRN
  Administered 2020-12-08 (×2): 1 mg via INTRAVENOUS

## 2020-12-08 MED ORDER — LIDOCAINE-EPINEPHRINE 1 %-1:100000 IJ SOLN
INTRAMUSCULAR | Status: AC
  Administered 2020-12-08: 10 mL
  Filled 2020-12-08: qty 1

## 2020-12-08 NOTE — H&P (Signed)
Chief Complaint: Patient was seen in consultation today for port-a-catheter placement.   Referring Physician(s): Cammie Sickle  Supervising Physician: Juliet Rude  Patient Status: [facility] - Out-pt  History of Present Illness: Felicia Manning is a 65 y.o. 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 She is familiar to IR from a lung nodule biopsy on 11/25/20. Her oncology team is preparing her for chemotherapy and has requested that Interventional Radiology evaluate her for port-a-catheter placement.   Past Medical History:  Diagnosis Date   Barrett's esophagus    Dysrhythmia    stress related at work.   GERD (gastroesophageal reflux disease)    Hepatitis C 2008   Hyperlipidemia    Hypertension    Hypothyroidism    Nephrolithiasis    Non-small cell carcinoma of left lung (HCC)    Osteoporosis    Pulmonary mass    Thyroid cancer ([place]) 2012   Thyroid cancer [facility])     Past Surgical History:  Procedure Laterality Date   ABDOMINAL HYSTERECTOMY  2006   COLONOSCOPY WITH PROPOFOL N/A 04/25/2015   Procedure: COLONOSCOPY WITH PROPOFOL;  Surgeon: Josefine Class, MD;  Location: [facility] ENDOSCOPY;  Service: Endoscopy;  Laterality: N/A;   CYSTOSCOPY W/ RETROGRADES Bilateral 05/19/2015   Procedure: CYSTOSCOPY WITH RETROGRADE PYELOGRAM;  Surgeon: Hollice Espy, MD;  Location: [facility] ORS;  Service: Urology;  Laterality: Bilateral;   CYSTOSCOPY W/ URETERAL STENT PLACEMENT Right 05/26/2015   Procedure: CYSTOSCOPY WITH STENT REPLACEMENT;  Surgeon: Hollice Espy, MD;  Location: [facility] ORS;  Service: Urology;  Laterality: Right;   CYSTOSCOPY WITH STENT PLACEMENT Right 05/19/2015   Procedure: CYSTOSCOPY WITH STENT PLACEMENT;  Surgeon: Hollice Espy, MD;  Location: [facility] ORS;  Service: Urology;  Laterality: Right;   CYSTOSCOPY WITH STENT PLACEMENT Right 05/26/2015   Procedure: CYSTOSCOPY  WITH STENT PLACEMENT;  Surgeon: Hollice Espy, MD;  Location: [facility] ORS;  Service: Urology;  Laterality: Right;   CYSTOSCOPY WITH STENT PLACEMENT Right 06/25/2016   Procedure: CYSTOSCOPY WITH STENT PLACEMENT;  Surgeon: Nickie Retort, MD;  Location: [facility] ORS;  Service: Urology;  Laterality: Right;   CYSTOSCOPY WITH STENT PLACEMENT Left 10/31/2017   Procedure: CYSTOSCOPY WITH STENT PLACEMENT;  Surgeon: Hollice Espy, MD;  Location: [facility] ORS;  Service: Urology;  Laterality: Left;   CYSTOSCOPY/URETEROSCOPY/HOLMIUM LASER/STENT PLACEMENT Left 07/23/2015   Procedure: CYSTOSCOPY/URETEROSCOPY/HOLMIUM LASER/STENT PLACEMENT/RETROGRADE PYELOGRAM;  Surgeon: Hollice Espy, MD;  Location: [facility] ORS;  Service: Urology;  Laterality: Left;   CYSTOSCOPY/URETEROSCOPY/HOLMIUM LASER/STENT PLACEMENT Left 11/16/2017   Procedure: CYSTOSCOPY/URETEROSCOPY/HOLMIUM LASER/STENT Exchange;  Surgeon: Hollice Espy, MD;  Location: [facility] ORS;  Service: Urology;  Laterality: Left;   ESOPHAGOGASTRODUODENOSCOPY (EGD) WITH PROPOFOL N/A 04/25/2015   Procedure: ESOPHAGOGASTRODUODENOSCOPY (EGD) WITH PROPOFOL;  Surgeon: Josefine Class, MD;  Location: [facility] ENDOSCOPY;  Service: Endoscopy;  Laterality: N/A;   EYE SURGERY Bilateral 1964   lazy eye repair   EYE SURGERY Bilateral 2001   lazy eye repair   LITHOTRIPSY  2009   THYROIDECTOMY  2010   URETEROSCOPY WITH HOLMIUM LASER LITHOTRIPSY Right 05/19/2015   Procedure: URETEROSCOPY WITH HOLMIUM LASER LITHOTRIPSY;  Surgeon: Hollice Espy, MD;  Location: [facility] ORS;  Service: Urology;  Laterality: Right;   URETEROSCOPY WITH HOLMIUM LASER LITHOTRIPSY Right 05/26/2015   Procedure: URETEROSCOPY WITH HOLMIUM LASER LITHOTRIPSY;  Surgeon: Hollice Espy, MD;  Location: [facility] ORS;  Service: Urology;  Laterality: Right;   URETEROSCOPY WITH HOLMIUM LASER LITHOTRIPSY Right 06/25/2016   Procedure: URETEROSCOPY  WITH HOLMIUM LASER LITHOTRIPSY;  Surgeon: Nickie Retort, MD;  Location: [facility] ORS;   Service: Urology;  Laterality: Right;    Allergies: Patient has no known allergies.  Medications: Prior to Admission medications   Medication Sig Start Date End Date Taking? Authorizing Provider  alendronate (FOSAMAX) 70 MG tablet One tab every Sunday on empty stomach with a full glass of water. Do not lie down or eat/drink anything else for the next 30 min. 01/24/20   [provider]  amLODipine (NORVASC) 5 MG tablet Take 5 mg by mouth every morning.     [provider]  amoxicillin-clavulanate (AUGMENTIN) 875-125 MG tablet Take 1 tablet by mouth 2 (two) times daily. 10/23/20   [provider]  atorvastatin (LIPITOR) 80 MG tablet Take 80 mg by mouth daily. 07/31/20   [provider]  cholecalciferol (VITAMIN D) 1000 units tablet Take 2,000 Units by mouth daily.    [provider]  Cranberry-Vitamin C-Probiotic (AZO CRANBERRY PO) Take 1 tablet by mouth daily.    [provider]  dexamethasone (DECADRON) 4 MG tablet Take 1 tablet by mouth twice a day the day before chemotherapy 12/02/20   Cammie Sickle, MD  folic acid (FOLVITE) 1 MG tablet Take 1 tablet (1 mg total) by mouth daily. 12/02/20   Cammie Sickle, MD  HYDROcodone-acetaminophen (NORCO/VICODIN) 5-325 MG tablet Take 1 tablet by mouth every 8 (eight) hours as needed for moderate pain. 12/02/20   Cammie Sickle, MD  levothyroxine (SYNTHROID, LEVOTHROID) 150 MCG tablet Take 150 mcg by mouth at bedtime.     [provider]  lidocaine-prilocaine (EMLA) cream Apply 1 application topically as needed. 12/02/20   Cammie Sickle, MD  losartan (COZAAR) 100 MG tablet Take 100 mg by mouth every morning.     [provider]  metoprolol succinate (TOPROL-XL) 25 MG 24 hr tablet Take 25 mg by mouth every morning.     [provider]  omeprazole (PRILOSEC) 20 MG capsule Take 20 mg by mouth daily before breakfast.     [provider]   ondansetron (ZOFRAN) 8 MG tablet Take 1 tablet (8 mg total) by mouth every 8 (eight) hours as needed for nausea or vomiting. Start on the third day after chemotherapy 12/02/20   Cammie Sickle, MD  predniSONE (DELTASONE) 10 MG tablet Take 10 mg by mouth daily. 11/10/20   [provider]  prochlorperazine (COMPAZINE) 10 MG tablet Take 1 tablet (10 mg total) by mouth every 6 (six) hours as needed for nausea or vomiting. 12/02/20   Cammie Sickle, MD  traMADol (ULTRAM) 50 MG tablet Take 1 tablet (50 mg total) by mouth every 8 (eight) hours as needed. 11/14/20   Cammie Sickle, MD     Family History  Problem Relation Age of Onset   CAD Mother    Heart disease Mother    Dementia Father    Bladder Cancer Neg Hx    Prostate cancer Neg Hx    Kidney cancer Neg Hx    Breast cancer Neg Hx     Social History   Socioeconomic History   Marital status: Divorced    Spouse name: Not on file   Number of children: Not on file   Years of education: Not on file   Highest education level: Not on file  Occupational History   Not on file  Tobacco Use   Smoking status: Every Day    Packs/day: 1.00  Years: 40.00    Pack years: 40.00    Types: Cigarettes   Smokeless tobacco: Never  Vaping Use   Vaping Use: Never used  Substance and Sexual Activity   Alcohol use: Yes    Comment: socially   Drug use: No   Sexual activity: Not Currently    Birth control/protection: None  Other Topics Concern   Not on file  Social History Narrative   Smoking: 1p 1-2 days since 17 years; alcohol: rare; 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     Social Determinants of Health   Financial Resource Strain: Not on file  Food Insecurity: Not on file  Transportation Needs: Not on file  Physical Activity: Not on file  Stress: Not on file  Social Connections: Not on file    Review of Systems: A 12 point ROS  discussed and pertinent positives are indicated in the HPI above.  All other systems are negative.  Review of Systems  Constitutional:  Positive for fatigue. Negative for appetite change.  Respiratory:  Negative for cough and shortness of breath.   Cardiovascular:  Negative for chest pain and leg swelling.  Gastrointestinal:  Negative for abdominal pain, diarrhea, nausea and vomiting.  Musculoskeletal:        Left-sided chest/muscle pain secondary to lung cancer.   Neurological:  Negative for headaches.   Vital Signs: BP 107/63   Pulse 65   Temp 97.8 F (36.6 C) (Oral)   Resp 18   Ht 5\' 5"  (1.651 m)   Wt 195 lb (88.5 kg)   SpO2 95%   BMI 32.45 kg/m   Physical Exam Constitutional:      General: She is not in acute distress. HENT:     Mouth/Throat:     Mouth: Mucous membranes are moist.     Pharynx: Oropharynx is clear.  Cardiovascular:     Rate and Rhythm: Normal rate and regular rhythm.  Pulmonary:     Effort: Pulmonary effort is normal.     Breath sounds: Normal breath sounds.  Abdominal:     General: Bowel sounds are normal.     Palpations: Abdomen is soft.     Tenderness: There is no abdominal tenderness.  Musculoskeletal:        General: Normal range of motion.     Right lower leg: No edema.     Left lower leg: No edema.  Skin:    General: Skin is warm and dry.  Neurological:     Mental Status: She is alert and oriented to person, place, and time.  Psychiatric:        Mood and Affect: Mood normal.        Behavior: Behavior normal.        Thought Content: Thought content normal.        Judgment: Judgment normal.    Imaging: MR Brain W Wo Contrast  Result Date: 11/17/2020 CLINICAL DATA:  Mass of lower lobe of left lung. Metastatic disease evaluation; recent headaches. EXAM: MRI HEAD WITHOUT AND WITH CONTRAST TECHNIQUE: Multiplanar, multiecho pulse sequences of the brain and surrounding structures were obtained without and with intravenous contrast. CONTRAST:   7.93mL GADAVIST GADOBUTROL 1 MMOL/ML IV SOLN COMPARISON:  Head CT 08/13/2017. PET-CT 11/05/2020. FINDINGS: Brain: Mild generalized parenchymal atrophy. Asymmetric left hippocampal volume loss and T2 FLAIR hyperintense signal abnormality (series 15, image 21) (series 17, image 14). Mild multifocal T2 FLAIR hyperintense signal abnormality within the cerebral white matter,  nonspecific but compatible with chronic small vessel ischemic disease. There is no acute infarct. No extra-axial fluid collection. No midline shift. Apparent punctate focus of enhancement within the right cerebellar hemisphere, appreciated on the coronal T1 weighted postcontrast sequence only (series 19, image 9). Vascular: Maintained flow voids within the proximal large arterial vessels. Skull and upper cervical spine: No focal suspicious marrow lesion. Sinuses/Orbits: Visualized orbits show no acute finding. Trace bilateral ethmoid sinus mucosal thickening. Impressions #1 and #2 will be called to the ordering clinician or representative by the Radiologist Assistant, and communication documented in the PACS or [employer]. IMPRESSION: Apparent punctate focus of enhancement within the right cerebellar hemisphere, appreciated on the coronal T1 weighted postcontrast sequence only. While this may reflect a focus of vascular enhancement or artifact, a punctate metastasis cannot be excluded. Short-interval 4-6 week brain MRI follow-up with contrast recommended. Asymmetric left hippocampal volume loss and T2 FLAIR hyperintense signal abnormality. If the patient has a history of seizures, this may reflect mesial temporal sclerosis. Alternatively, this may reflect sequela of a nonspecific remote insult. Mild chronic small-vessel ischemic changes within the cerebral white matter. Mild generalized parenchymal atrophy. Electronically Signed   By: Kellie Simmering D.O.   On: 11/17/2020 08:57   CT BIOPSY  Result Date: 11/25/2020 CLINICAL DATA:  Left lower  lobe lung mass, left-sided pleural masses and metastatic lymphadenopathy in the left hilum and mediastinum by prior imaging. Pleural masses including inferolateral masses abutting left-sided ribs are hypermetabolic by PET and the patient presents for pleural mass biopsy. EXAM: CT GUIDED CORE BIOPSY OF LEFT PLEURAL MASS ANESTHESIA/SEDATION: Moderate (conscious) sedation was employed during this procedure. A total of Versed 1.0 mg and Fentanyl 50 mcg was administered intravenously by radiology nursing under my direct supervision. Moderate Sedation Time: 19 minutes. The patient's level of consciousness and vital signs were monitored continuously by radiology nursing throughout the procedure under my direct supervision. PROCEDURE: The procedure risks, benefits, and alternatives were explained to the patient. Questions regarding the procedure were encouraged and answered. The patient understands and consents to the procedure. A time-out was performed prior to initiating the procedure. CT was performed in a supine position with the left side rolled up slightly through the lower chest and upper abdomen. The left lower lateral chest wall and contiguous upper abdominal wall was prepped with chlorhexidine in a sterile fashion, and a sterile drape was applied covering the operative field. A sterile gown and sterile gloves were used for the procedure. Local anesthesia was provided with 1% Lidocaine. Left inferolateral pleural mass was targeted. Under CT guidance, a 17 gauge trocar needle was advanced to the level of a mass. After confirming needle tip position, coaxial 18 gauge core biopsy samples were obtained. Three separate core biopsy samples were obtained and placed in formalin. Additional CT imaging was performed after outer needle removal. COMPLICATIONS: None FINDINGS: Based on the PET scan, the plaque like pleural mass in the far inferolateral left pleural space abutting the distal aspect of the left ninth rib was  targeted in an area soft tissue thickening demonstrating abnormal hypermetabolism on the PET scan. The area of pleural mass measures roughly 1.5 x 4.2 cm in transverse dimensions on axial CT. Solid tissue was obtained. There were no complications evident on immediate postprocedural CT. IMPRESSION: CT-guided core biopsy of left pleural mass in the far inferolateral pleural space abutting the distal aspect of the left ninth rib. Solid tissue was obtained. Electronically Signed   By: Jenness Corner.D.  On: 11/25/2020 14:08    Labs:  CBC: Recent Labs    11/25/20 0902  WBC 13.9*  HGB 12.9  HCT 37.9  PLT 231    COAGS: Recent Labs    11/25/20 0902  INR 0.9    BMP: Recent Labs    10/23/20 1224  CREATININE 0.70    LIVER FUNCTION TESTS: No results for input(s): BILITOT, AST, ALT, ALKPHOS, PROT, ALBUMIN in the last 8760 hours.  TUMOR MARKERS: No results for input(s): AFPTM, CEA, CA199, CHROMGRNA in the last 8760 hours.  Assessment and Plan:  Non-small cell carcinoma of the left lung: Selinda Orion, 65 year old female, presents today to the [facility] Interventional Radiology department for an image-guided port-a-catheter placement.  Risks and benefits of image-guided port-a-catheter placement were discussed with the patient including, but not limited to bleeding, infection, pneumothorax, or fibrin sheath development and need for additional procedures.  All of the patient's questions were answered, patient is agreeable to proceed. She has been NPO.   Consent signed and in chart.  Thank you for this interesting consult.  I greatly enjoyed meeting Rane Dumm and look forward to participating in their care.  A copy of this report was sent to the requesting provider on this date.  Electronically Signed: Soyla Dryer, AGACNP-BC [phone] 12/08/2020, 8:47 AM   I spent a total of  30 Minutes   in face to face in clinical consultation, greater  than 50% of which was counseling/coordinating care for port-a-catheter placement.

## 2020-12-08 NOTE — Procedures (Signed)
Interventional Radiology Procedure Note  Date of Procedure: 12/08/2020  Procedure: Port placement   Findings:  1. Placement of right chest port via  right IJ    Complications: No immediate complications noted.   Estimated Blood Loss: minimal  Follow-up and Recommendations: 1. Ready for use    Albin Felling, MD  Vascular & Interventional Radiology  12/08/2020 9:56 AM

## 2020-12-08 NOTE — Progress Notes (Signed)
Patient clinically stable post Port placement per Dr Denna Haggard, tolerated well. Vitals stable pre and post procedure. Denies complaints presently. Received Versed 2 mg along with Fentanyl 100 mcg IV for procedure. Report given to Grier Rocher in specials post procedure/recovery.

## 2020-12-10 ENCOUNTER — Inpatient Hospital Stay: Payer: 59

## 2020-12-10 ENCOUNTER — Encounter: Payer: Self-pay | Admitting: Internal Medicine

## 2020-12-10 ENCOUNTER — Other Ambulatory Visit: Payer: Self-pay | Admitting: *Deleted

## 2020-12-10 ENCOUNTER — Inpatient Hospital Stay (HOSPITAL_BASED_OUTPATIENT_CLINIC_OR_DEPARTMENT_OTHER): Payer: 59 | Admitting: Internal Medicine

## 2020-12-10 ENCOUNTER — Encounter: Payer: Self-pay | Admitting: *Deleted

## 2020-12-10 ENCOUNTER — Other Ambulatory Visit: Payer: Self-pay

## 2020-12-10 VITALS — BP 130/70 | HR 79

## 2020-12-10 DIAGNOSIS — C3432 Malignant neoplasm of lower lobe, left bronchus or lung: Secondary | ICD-10-CM

## 2020-12-10 DIAGNOSIS — R918 Other nonspecific abnormal finding of lung field: Secondary | ICD-10-CM

## 2020-12-10 DIAGNOSIS — Z95828 Presence of other vascular implants and grafts: Secondary | ICD-10-CM

## 2020-12-10 DIAGNOSIS — Z5111 Encounter for antineoplastic chemotherapy: Secondary | ICD-10-CM | POA: Diagnosis not present

## 2020-12-10 DIAGNOSIS — R739 Hyperglycemia, unspecified: Secondary | ICD-10-CM

## 2020-12-10 LAB — CBC WITH DIFFERENTIAL/PLATELET
Abs Immature Granulocytes: 0.14 10*3/uL — ABNORMAL HIGH (ref 0.00–0.07)
Basophils Absolute: 0 10*3/uL (ref 0.0–0.1)
Basophils Relative: 0 %
Eosinophils Absolute: 0 10*3/uL (ref 0.0–0.5)
Eosinophils Relative: 0 %
HCT: 40.5 % (ref 36.0–46.0)
Hemoglobin: 13.3 g/dL (ref 12.0–15.0)
Immature Granulocytes: 1 %
Lymphocytes Relative: 9 %
Lymphs Abs: 1.5 10*3/uL (ref 0.7–4.0)
MCH: 29.6 pg (ref 26.0–34.0)
MCHC: 32.8 g/dL (ref 30.0–36.0)
MCV: 90 fL (ref 80.0–100.0)
Monocytes Absolute: 0.7 10*3/uL (ref 0.1–1.0)
Monocytes Relative: 4 %
Neutro Abs: 15 10*3/uL — ABNORMAL HIGH (ref 1.7–7.7)
Neutrophils Relative %: 86 %
Platelets: 278 10*3/uL (ref 150–400)
RBC: 4.5 MIL/uL (ref 3.87–5.11)
RDW: 15.9 % — ABNORMAL HIGH (ref 11.5–15.5)
WBC: 17.5 10*3/uL — ABNORMAL HIGH (ref 4.0–10.5)
nRBC: 0 % (ref 0.0–0.2)

## 2020-12-10 LAB — COMPREHENSIVE METABOLIC PANEL
ALT: 22 U/L (ref 0–44)
AST: 15 U/L (ref 15–41)
Albumin: 4 g/dL (ref 3.5–5.0)
Alkaline Phosphatase: 68 U/L (ref 38–126)
Anion gap: 10 (ref 5–15)
BUN: 16 mg/dL (ref 8–23)
CO2: 25 mmol/L (ref 22–32)
Calcium: 9.1 mg/dL (ref 8.9–10.3)
Chloride: 102 mmol/L (ref 98–111)
Creatinine, Ser: 0.73 mg/dL (ref 0.44–1.00)
GFR, Estimated: >60 mL/min (ref >60)
Glucose, Bld: 133 mg/dL — ABNORMAL HIGH (ref 70–99)
Potassium: 4.3 mmol/L (ref 3.5–5.1)
Sodium: 137 mmol/L (ref 135–145)
Total Bilirubin: 0.6 mg/dL (ref 0.3–1.2)
Total Protein: 7.5 g/dL (ref 6.5–8.1)

## 2020-12-10 MED ORDER — BLOOD GLUCOSE MONITOR KIT: PACK | 0 refills | Status: DC

## 2020-12-10 MED ORDER — PALONOSETRON HCL INJECTION 0.25 MG/5ML
0.2500 mg | Freq: Once | INTRAVENOUS | Status: AC
  Administered 2020-12-10: 0.25 mg via INTRAVENOUS
  Filled 2020-12-10: qty 5

## 2020-12-10 MED ORDER — HEPARIN SOD (PORK) LOCK FLUSH 100 UNIT/ML IV SOLN
500.0000 [IU] | Freq: Once | INTRAVENOUS | Status: AC | PRN
  Filled 2020-12-10: qty 5

## 2020-12-10 MED ORDER — SODIUM CHLORIDE 0.9 % IV SOLN
628.0000 mg | Freq: Once | INTRAVENOUS | Status: AC
  Administered 2020-12-10: 630 mg via INTRAVENOUS
  Filled 2020-12-10: qty 63

## 2020-12-10 MED ORDER — HEPARIN SOD (PORK) LOCK FLUSH 100 UNIT/ML IV SOLN
INTRAVENOUS | Status: AC
  Administered 2020-12-10: 500 [IU]
  Filled 2020-12-10: qty 5

## 2020-12-10 MED ORDER — SODIUM CHLORIDE 0.9% FLUSH
10.0000 mL | Freq: Once | INTRAVENOUS | Status: AC
  Administered 2020-12-10: 10 mL via INTRAVENOUS
  Filled 2020-12-10: qty 10

## 2020-12-10 MED ORDER — SODIUM CHLORIDE 0.9 % IV SOLN
150.0000 mg | Freq: Once | INTRAVENOUS | Status: AC
  Administered 2020-12-10: 150 mg via INTRAVENOUS
  Filled 2020-12-10: qty 150

## 2020-12-10 MED ORDER — SODIUM CHLORIDE 0.9 % IV SOLN
Freq: Once | INTRAVENOUS | Status: AC
  Filled 2020-12-10: qty 250

## 2020-12-10 MED ORDER — SODIUM CHLORIDE 0.9 % IV SOLN
500.0000 mg/m2 | Freq: Once | INTRAVENOUS | Status: AC
  Administered 2020-12-10: 1000 mg via INTRAVENOUS
  Filled 2020-12-10: qty 40

## 2020-12-10 MED ORDER — SODIUM CHLORIDE 0.9 % IV SOLN
10.0000 mg | Freq: Once | INTRAVENOUS | Status: AC
  Administered 2020-12-10: 10 mg via INTRAVENOUS
  Filled 2020-12-10: qty 10

## 2020-12-10 NOTE — Progress Notes (Signed)
[facility] CONSULT NOTE  Patient Care Team: Dion Body, MD as PCP - General (Family Medicine) Leata Mouse (Inactive) Byrnett, Forest Gleason, MD (General Surgery) Telford Nab, RN as Oncology Nurse Navigator  CHIEF COMPLAINTS/PURPOSE OF CONSULTATION: lung cancer  Oncology History Overview Note  IMPRESSION: Hypermetabolic solid pulmonary nodule of the left lower lobe, favor primary lung malignancy.   Numerous hypermetabolic nodules of the lower left pleura, compatible with pleural metastatic disease.   Hypermetabolic subcarinal and left hilar lymph nodes, compatible with metastatic disease.   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 Recommend attention on follow-up.   Mild asymmetric FDG uptake below mediastinal blood pool within a small nodule of the left parotid gland, favored to be benign. Recommend attention on follow-up.   No evidence of metastatic disease in the abdomen or pelvis.   No evidence of osseous metastatic disease.   ---------------------  # MARCH 2022- incidental LLL [ 0.9 cm] s/p COVID vaccine; [Dr.Aleskerov]; July 2022- left pleural pain-   COPD-non-complaint    ------------------   DIAGNOSIS:  A. PLEURAL MASS, LEFT CHEST; BIOPSY:  - DIAGNOSTIC OF MALIGNANCY.  - NON-SMALL CELL CARCINOMA, FAVOR ADENOCARCINOMA.   Comment:  The tumor cells are positive for CK7 and TTF1 (patchy).  They are  negative for p40.   # 12/10/2020-carbo Alimta; cycle #1 [Keytruda pending insurance]  #NGS PD-L1 TPS 1%   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[place])  05/02/2015 Initial Diagnosis   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[facility])   Cancer of lower lobe of left lung ([place])  12/02/2020 Initial Diagnosis   Cancer of lower lobe of left lung ([place])    12/10/2020 Cancer Staging   Staging form: Lung, AJCC 8th Edition - Clinical: Stage IVA (cT4, cN3, cM1a) - Signed by Cammie Sickle, MD on 12/10/2020    12/10/2020 -  Chemotherapy   Patient is on Treatment Plan : LUNG CARBOplatin / Pemetrexed / Pembrolizumab q21d Induction x 4 cycles / Maintenance Pemetrexed + Pembrolizumab        HISTORY OF PRESENTING ILLNESS: Ambulating independently.  Alone. Felicia Manning 65 y.o.  female history of smoking-with stage IV non-small cell lung cancer favor adenocarcinoma is here to proceed with chemotherapy.  In the interim patient had a port placement.   Patient's chest wall pain has been treated with prednisone currently on 10 mg a day; also on hydrocodone.  No headaches.  Review of Systems  Constitutional:  Negative for chills, diaphoresis, fever, malaise/fatigue and weight loss.  HENT:  Negative for nosebleeds and sore throat.   Eyes:  Negative for double vision.  Respiratory:  Positive for cough, sputum production and wheezing. Negative for hemoptysis and shortness of breath.   Cardiovascular:  Positive for chest pain. Negative for palpitations, orthopnea and leg swelling.  Gastrointestinal:  Negative for abdominal pain, blood in stool, constipation, diarrhea, heartburn, melena, nausea and vomiting.  Genitourinary:  Negative for dysuria, frequency and urgency.  Musculoskeletal:  Negative for back pain and joint pain.  Skin: Negative.  Negative for itching and rash.  Neurological:  Positive for headaches. Negative for dizziness, tingling, focal weakness and weakness.  Endo/Heme/Allergies:  Does not bruise/bleed easily.  Psychiatric/Behavioral:  Negative for depression. The patient is not nervous/anxious and does not have insomnia.     MEDICAL HISTORY:  Past Medical History:  Diagnosis Date  .  Barrett's esophagus   . Dysrhythmia    stress related at work.  Marland Kitchen GERD (gastroesophageal reflux disease)   . Hepatitis C 2008  .  Hyperlipidemia   . Hypertension   . Hypothyroidism   . Nephrolithiasis   . Non-small cell carcinoma of left lung ([place])   . Osteoporosis   . Pulmonary mass   . Thyroid cancer ([place]) 2012  . Thyroid cancer [facility])     SURGICAL HISTORY: Past Surgical History:  Procedure Laterality Date  . ABDOMINAL HYSTERECTOMY  2006  . COLONOSCOPY WITH PROPOFOL N/A 04/25/2015   Procedure: COLONOSCOPY WITH PROPOFOL;  Surgeon: Josefine Class, MD;  Location: [facility] ENDOSCOPY;  Service: Endoscopy;  Laterality: N/A;  . CYSTOSCOPY W/ RETROGRADES Bilateral 05/19/2015   Procedure: CYSTOSCOPY WITH RETROGRADE PYELOGRAM;  Surgeon: Hollice Espy, MD;  Location: [facility] ORS;  Service: Urology;  Laterality: Bilateral;  . CYSTOSCOPY W/ URETERAL STENT PLACEMENT Right 05/26/2015   Procedure: CYSTOSCOPY WITH STENT REPLACEMENT;  Surgeon: Hollice Espy, MD;  Location: [facility] ORS;  Service: Urology;  Laterality: Right;  . CYSTOSCOPY WITH STENT PLACEMENT Right 05/19/2015   Procedure: CYSTOSCOPY WITH STENT PLACEMENT;  Surgeon: Hollice Espy, MD;  Location: [facility] ORS;  Service: Urology;  Laterality: Right;  . CYSTOSCOPY WITH STENT PLACEMENT Right 05/26/2015   Procedure: CYSTOSCOPY WITH STENT PLACEMENT;  Surgeon: Hollice Espy, MD;  Location: [facility] ORS;  Service: Urology;  Laterality: Right;  . CYSTOSCOPY WITH STENT PLACEMENT Right 06/25/2016   Procedure: CYSTOSCOPY WITH STENT PLACEMENT;  Surgeon: Nickie Retort, MD;  Location: [facility] ORS;  Service: Urology;  Laterality: Right;  . CYSTOSCOPY WITH STENT PLACEMENT Left 10/31/2017   Procedure: CYSTOSCOPY WITH STENT PLACEMENT;  Surgeon: Hollice Espy, MD;  Location: [facility] ORS;  Service: Urology;  Laterality: Left;  . CYSTOSCOPY/URETEROSCOPY/HOLMIUM LASER/STENT PLACEMENT Left 07/23/2015   Procedure: CYSTOSCOPY/URETEROSCOPY/HOLMIUM LASER/STENT PLACEMENT/RETROGRADE PYELOGRAM;  Surgeon: Hollice Espy, MD;  Location: [facility] ORS;  Service: Urology;  Laterality: Left;  .  CYSTOSCOPY/URETEROSCOPY/HOLMIUM LASER/STENT PLACEMENT Left 11/16/2017   Procedure: CYSTOSCOPY/URETEROSCOPY/HOLMIUM LASER/STENT Exchange;  Surgeon: Hollice Espy, MD;  Location: [facility] ORS;  Service: Urology;  Laterality: Left;  . ESOPHAGOGASTRODUODENOSCOPY (EGD) WITH PROPOFOL N/A 04/25/2015   Procedure: ESOPHAGOGASTRODUODENOSCOPY (EGD) WITH PROPOFOL;  Surgeon: Josefine Class, MD;  Location: [facility] ENDOSCOPY;  Service: Endoscopy;  Laterality: N/A;  . EYE SURGERY Bilateral 1964   lazy eye repair  . EYE SURGERY Bilateral 2001   lazy eye repair  . IR IMAGING GUIDED PORT INSERTION  12/08/2020  . LITHOTRIPSY  2009  . THYROIDECTOMY  2010  . URETEROSCOPY WITH HOLMIUM LASER LITHOTRIPSY Right 05/19/2015   Procedure: URETEROSCOPY WITH HOLMIUM LASER LITHOTRIPSY;  Surgeon: Hollice Espy, MD;  Location: [facility] ORS;  Service: Urology;  Laterality: Right;  . URETEROSCOPY WITH HOLMIUM LASER LITHOTRIPSY Right 05/26/2015   Procedure: URETEROSCOPY WITH HOLMIUM LASER LITHOTRIPSY;  Surgeon: Hollice Espy, MD;  Location: [facility] ORS;  Service: Urology;  Laterality: Right;  . URETEROSCOPY WITH HOLMIUM LASER LITHOTRIPSY Right 06/25/2016   Procedure: URETEROSCOPY WITH HOLMIUM LASER LITHOTRIPSY;  Surgeon: Nickie Retort, MD;  Location: [facility] ORS;  Service: Urology;  Laterality: Right;    SOCIAL HISTORY: Social History   Socioeconomic History  . Marital status: Divorced    Spouse name: Not on file  . Number of children: Not on file  . Years of education: Not on file  . Highest education level: Not on file  Occupational History  . Not on file  Tobacco Use  . Smoking status: Every Day    Packs/day: 1.00  Years: 40.00    Pack years: 40.00    Types: Cigarettes  . Smokeless tobacco: Never  Vaping Use  . Vaping Use: Never used  Substance and Sexual Activity  . Alcohol use: Yes    Comment: socially  . Drug use: No  . Sexual activity: Not Currently    Birth control/protection: None  Other Topics Concern  .  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     Social Determinants of Health   Financial Resource Strain: Not on file  Food Insecurity: Not on file  Transportation Needs: Not on file  Physical Activity: Not on file  Stress: Not on file  Social Connections: Not on file  Intimate Partner Violence: Not on file    FAMILY HISTORY: Family History  Problem Relation Age of Onset  . CAD Mother   . Heart disease Mother   . Dementia Father   . Bladder Cancer Neg Hx   . Prostate cancer Neg Hx   . Kidney cancer Neg Hx   . Breast cancer Neg Hx     ALLERGIES:  has No Known Allergies.  MEDICATIONS:  Current Outpatient Medications  Medication Sig Dispense Refill  . alendronate (FOSAMAX) 70 MG tablet One tab every Sunday on empty stomach with a full glass of water. Do not lie down or eat/drink anything else for the next 30 min.    Marland Kitchen atorvastatin (LIPITOR) 80 MG tablet Take 80 mg by mouth daily.    . cholecalciferol (VITAMIN D) 1000 units tablet Take 2,000 Units by mouth daily.    Marland Kitchen dexamethasone (DECADRON) 4 MG tablet Take 1 tablet by mouth twice a day the day before chemotherapy 30 tablet 0  . folic acid (FOLVITE) 1 MG tablet Take 1 tablet (1 mg total) by mouth daily. 30 tablet 2  . HYDROcodone-acetaminophen (NORCO/VICODIN) 5-325 MG tablet Take 1 tablet by mouth every 8 (eight) hours as needed for moderate pain. 60 tablet 0  . levothyroxine (SYNTHROID, LEVOTHROID) 150 MCG tablet Take 150 mcg by mouth at bedtime.     . lidocaine-prilocaine (EMLA) cream Apply 1 application topically as needed. 30 g 0  . losartan (COZAAR) 100 MG tablet Take 100 mg by mouth every morning.     . metoprolol succinate (TOPROL-XL) 25 MG 24 hr tablet Take 25 mg by mouth every morning.   1  . omeprazole (PRILOSEC) 20 MG capsule Take 20 mg by mouth daily  before breakfast.     . ondansetron (ZOFRAN) 8 MG tablet Take 1 tablet (8 mg total) by mouth every 8 (eight) hours as needed for nausea or vomiting. Start on the third day after chemotherapy 30 tablet 2  . predniSONE (DELTASONE) 10 MG tablet Take 10 mg by mouth daily.    . prochlorperazine (COMPAZINE) 10 MG tablet Take 1 tablet (10 mg total) by mouth every 6 (six) hours as needed for nausea or vomiting. 30 tablet 2  . traMADol (ULTRAM) 50 MG tablet Take 1 tablet (50 mg total) by mouth every 8 (eight) hours as needed. 60 tablet 0  . amLODipine (NORVASC) 5 MG tablet Take 5 mg by mouth every morning.     Marland Kitchen amoxicillin-clavulanate (AUGMENTIN) 875-125 MG tablet Take 1 tablet by mouth 2 (two) times daily. (Patient not taking: Reported on 12/10/2020)    . Cranberry-Vitamin C-Probiotic (AZO CRANBERRY PO) Take 1 tablet  by mouth daily. (Patient not taking: Reported on 12/10/2020)     No current facility-administered medications for this visit.   Facility-Administered Medications Ordered in Other Visits  Medication Dose Route Frequency Provider Last Rate Last Admin  . CARBOplatin (PARAPLATIN) 630 mg in sodium chloride 0.9 % 250 mL chemo infusion  630 mg Intravenous Once Charlaine Dalton R, MD      . dexamethasone (DECADRON) 10 mg in sodium chloride 0.9 % 50 mL IVPB  10 mg Intravenous Once Cammie Sickle, MD 204 mL/hr at 12/10/20 0928 10 mg at 12/10/20 0928  . fosaprepitant (EMEND) 150 mg in sodium chloride 0.9 % 145 mL IVPB  150 mg Intravenous Once Charlaine Dalton R, MD      . heparin lock flush 100 UNIT/ML injection           . heparin lock flush 100 unit/mL  500 Units Intracatheter Once PRN Charlaine Dalton R, MD      . PEMEtrexed (ALIMTA) 1,000 mg in sodium chloride 0.9 % 100 mL chemo infusion  500 mg/m2 (Treatment Plan Recorded) Intravenous Once Cammie Sickle, MD          .  PHYSICAL EXAMINATION: ECOG PERFORMANCE STATUS: 1 - Symptomatic but completely ambulatory  Vitals:    12/10/20 0831  BP: 130/90  Pulse: 81  Resp: 18  Temp: 98 F (36.7 C)  SpO2: 96%    Filed Weights   12/10/20 0831  Weight: 195 lb 9.6 oz (88.7 kg)     Physical Exam Vitals and nursing note reviewed.  HENT:     Head: Normocephalic and atraumatic.     Mouth/Throat:     Pharynx: Oropharynx is clear.  Eyes:     Extraocular Movements: Extraocular movements intact.     Pupils: Pupils are equal, round, and reactive to light.  Cardiovascular:     Rate and Rhythm: Normal rate and regular rhythm.  Pulmonary:     Comments: Decreased breath sounds bilaterally.  Abdominal:     Palpations: Abdomen is soft.  Musculoskeletal:        General: Normal range of motion.     Cervical back: Normal range of motion.  Skin:    General: Skin is warm.  Neurological:     General: No focal deficit present.     Mental Status: She is alert and oriented to person, place, and time.  Psychiatric:        Behavior: Behavior normal.        Judgment: Judgment normal.     LABORATORY DATA:  I have reviewed the data as listed Lab Results  Component Value Date   WBC 17.5 (H) 12/10/2020   HGB 13.3 12/10/2020   HCT 40.5 12/10/2020   MCV 90.0 12/10/2020   PLT 278 12/10/2020   Recent Labs    10/23/20 1224 12/10/20 0811  NA  --  137  K  --  4.3  CL  --  102  CO2  --  25  GLUCOSE  --  133*  BUN  --  16  CREATININE 0.70 0.73  CALCIUM  --  9.1  GFRNONAA  --  >60  PROT  --  7.5  ALBUMIN  --  4.0  AST  --  15  ALT  --  22  ALKPHOS  --  68  BILITOT  --  0.6    RADIOGRAPHIC STUDIES: I have personally reviewed the radiological images as listed and agreed with the findings in the report. MR Brain W  Wo Contrast  Result Date: 11/17/2020 CLINICAL DATA:  Mass of lower lobe of left lung. Metastatic disease evaluation; recent headaches. EXAM: MRI HEAD WITHOUT AND WITH CONTRAST TECHNIQUE: Multiplanar, multiecho pulse sequences of the brain and surrounding structures were obtained without and with  intravenous contrast. CONTRAST:  7.38m GADAVIST GADOBUTROL 1 MMOL/ML IV SOLN COMPARISON:  Head CT 08/13/2017. PET-CT 11/05/2020. FINDINGS: Brain: Mild generalized parenchymal atrophy. Asymmetric left hippocampal volume loss and T2 FLAIR hyperintense signal abnormality (series 15, image 21) (series 17, image 14). Mild multifocal T2 FLAIR hyperintense signal abnormality within the cerebral white matter, nonspecific but compatible with chronic small vessel ischemic disease. There is no acute infarct. No extra-axial fluid collection. No midline shift. Apparent punctate focus of enhancement within the right cerebellar hemisphere, appreciated on the coronal T1 weighted postcontrast sequence only (series 19, image 9). Vascular: Maintained flow voids within the proximal large arterial vessels. Skull and upper cervical spine: No focal suspicious marrow lesion. Sinuses/Orbits: Visualized orbits show no acute finding. Trace bilateral ethmoid sinus mucosal thickening. Impressions #1 and #2 will be called to the ordering clinician or representative by the Radiologist Assistant, and communication documented in the PACS or [employer] IMPRESSION: Apparent punctate focus of enhancement within the right cerebellar hemisphere, appreciated on the coronal T1 weighted postcontrast sequence only. While this may reflect a focus of vascular enhancement or artifact, a punctate metastasis cannot be excluded. Short-interval 4-6 week brain MRI follow-up with contrast recommended. Asymmetric left hippocampal volume loss and T2 FLAIR hyperintense signal abnormality. If the patient has a history of seizures, this may reflect mesial temporal sclerosis. Alternatively, this may reflect sequela of a nonspecific remote insult. Mild chronic small-vessel ischemic changes within the cerebral white matter. Mild generalized parenchymal atrophy. Electronically Signed   By: KKellie SimmeringD.O.   On: 11/17/2020 08:57   CT BIOPSY  Result Date:  11/25/2020 CLINICAL DATA:  Left lower lobe lung mass, left-sided pleural masses and metastatic lymphadenopathy in the left hilum and mediastinum by prior imaging. Pleural masses including inferolateral masses abutting left-sided ribs are hypermetabolic by PET and the patient presents for pleural mass biopsy. EXAM: CT GUIDED CORE BIOPSY OF LEFT PLEURAL MASS ANESTHESIA/SEDATION: Moderate (conscious) sedation was employed during this procedure. A total of Versed 1.0 mg and Fentanyl 50 mcg was administered intravenously by radiology nursing under my direct supervision. Moderate Sedation Time: 19 minutes. The patient's level of consciousness and vital signs were monitored continuously by radiology nursing throughout the procedure under my direct supervision. PROCEDURE: The procedure risks, benefits, and alternatives were explained to the patient. Questions regarding the procedure were encouraged and answered. The patient understands and consents to the procedure. A time-out was performed prior to initiating the procedure. CT was performed in a supine position with the left side rolled up slightly through the lower chest and upper abdomen. The left lower lateral chest wall and contiguous upper abdominal wall was prepped with chlorhexidine in a sterile fashion, and a sterile drape was applied covering the operative field. A sterile gown and sterile gloves were used for the procedure. Local anesthesia was provided with 1% Lidocaine. Left inferolateral pleural mass was targeted. Under CT guidance, a 17 gauge trocar needle was advanced to the level of a mass. After confirming needle tip position, coaxial 18 gauge core biopsy samples were obtained. Three separate core biopsy samples were obtained and placed in formalin. Additional CT imaging was performed after outer needle removal. COMPLICATIONS: None FINDINGS: Based on the PET scan, the plaque like pleural  mass in the far inferolateral left pleural space abutting the distal  aspect of the left ninth rib was targeted in an area soft tissue thickening demonstrating abnormal hypermetabolism on the PET scan. The area of pleural mass measures roughly 1.5 x 4.2 cm in transverse dimensions on axial CT. Solid tissue was obtained. There were no complications evident on immediate postprocedural CT. IMPRESSION: CT-guided core biopsy of left pleural mass in the far inferolateral pleural space abutting the distal aspect of the left ninth rib. Solid tissue was obtained. Electronically Signed   By: Aletta Edouard M.D.   On: 11/25/2020 14:08   IR IMAGING GUIDED PORT INSERTION  Result Date: 12/08/2020 INDICATION: IV access for chemotherapy EXAM: Ultrasound-guided puncture of the right internal jugular vein Placement of a right-sided chest port using fluoroscopic guidance MEDICATIONS: None ANESTHESIA/SEDATION: Moderate (conscious) sedation was employed during this procedure. A total of Versed 2 mg and Fentanyl 100 mcg was administered intravenously. Moderate Sedation Time: 20 minutes. The patient's level of consciousness and vital signs were monitored continuously by radiology nursing throughout the procedure under my direct supervision. FLUOROSCOPY TIME:  Fluoroscopy Time: 36 seconds with 2 exposures COMPLICATIONS: None immediate. PROCEDURE: Informed written consent was obtained from the patient after a thorough discussion of the procedural risks, benefits and alternatives. All questions were addressed. Maximal Sterile Barrier Technique was utilized including caps, mask, sterile gowns, sterile gloves, sterile drape, hand hygiene and skin antiseptic. A timeout was performed prior to the initiation of the procedure. The patient was placed supine on the exam table. The right neck and chest was prepped and draped in the standard sterile fashion. A preliminary ultrasound of the right neck was performed and demonstrates a patent right internal jugular vein. A permanent ultrasound image was stored in the  electronic medical record. The overlying skin was anesthetized with 1% Lidocaine. Using ultrasound guidance, access was obtained into the right internal jugular vein using a 21 gauge micropuncture set. A wire was advanced into the SVC, a short incision was made at the puncture site, and serial dilatation performed. Next, in an ipsilateral infraclavicular location, an incision was made at the site of the subcutaneous reservoir. Blunt dissection was used to open a pocket to contain the reservoir. A subcutaneous tunnel was then created from the port site to the puncture site. A(n) 8 Fr single lumen catheter was advanced through the tunnel. The catheter was attached to the port and this was placed in the subcutaneous pocket. Under fluoroscopic guidance, a peel away sheath was placed, and the catheter was trimmed to the appropriate length and was advanced into the central veins. The catheter length is 23 cm. The tip of the catheter lies near the superior cavoatrial junction. The port flushes and aspirates appropriately. The port was flushed and locked with heparinized saline. The port pocket was closed in 2 layers using 3-0 and 4-0 Vicryl/absorbable suture. Dermabond was also applied to both incisions. The patient tolerated the procedure well and was transferred to recovery in stable condition. IMPRESSION: Successful placement of a right-sided chest port using the right internal jugular vein. The port is ready for immediate use. Electronically Signed   By: Albin Felling M.D.   On: 12/08/2020 10:37    ASSESSMENT & PLAN:   Cancer of lower lobe of left lung ([place]) #Stage IV -non-small cell favor adenocarcinoma; September 2022 PET scan - hypermetabolic solid pulmonary nodule of the left lower lobe, favor primary lung malignancy; numerous hypermetabolic nodules left lower lobe pleura; subcarinal/left hilar lymph  nodes-positive for metastatic disease.  Bilateral sub-solid nodules compared to CT April 2021.  No evidence of  abdomen pelvis metastases or bone metastasis.  NGS pending.  #Proceed with chemotherapy-carbo Alimta [Keytruda not approved by insurance; awaiting NGS]; s/p Q25 on folic acid/dexamethasone treatment  # Again reviewed with the patient the potential side effects of chemotherapy; and goal of treatment being palliative.  # MRI brain-punctate lesion-asymptomatic [D/w- Dr.Vaslow]; will repeat short-term imaging in 2 months [SRS protocol] or so.  # Left chest wall pain: STOP  Prednisone.  Not well controlled. hydrocodonel every 6 hours as needed.  #COPD: Noncompliant with inhalers.  Recommend follow up with pulmonary.   #Diabetes-A1c 6.9 [AUG 2022]-not on medications; diet controlled; BG-133; recommend glucose monitor/test strips  # Mediport placement-stable  #DISPOSITION: # Carbo Alimta- today # 3 week- MDL labs- cbc/cmp; Carbo-Alimta-keytruda Dr.B         All questions were answered. The patient knows to call the clinic with any problems, questions or concerns.       Cammie Sickle, MD 12/10/2020 9:29 AM

## 2020-12-10 NOTE — Progress Notes (Signed)
Met with patient during follow up visit with Dr. Rogue Bussing to start chemotherapy treatments. All questions answered during visit. Informed pt that will be given print out of scheduling appts prior to leaving today. Instructed pt to call with any questions or needs. Pt verbalized understanding.

## 2020-12-10 NOTE — Patient Instructions (Signed)
[facility]  Discharge Instructions: Thank you for choosing [facility] to provide your oncology and hematology care.  If you have a lab appointment with the [facility], please go directly to the [facility] and check in at the registration area.  Wear comfortable clothing and clothing appropriate for easy access to any Portacath or PICC line.   We strive to give you quality time with your provider. You may need to reschedule your appointment if you arrive late (15 or more minutes).  Arriving late affects you and other patients whose appointments are after yours.  Also, if you miss three or more appointments without notifying the office, you may be dismissed from the clinic at the provider's discretion.      For prescription refill requests, have your pharmacy contact our office and allow 72 hours for refills to be completed.    Today you received the following chemotherapy and/or immunotherapy agents Alimta & Carboplatin   To help prevent nausea and vomiting after your treatment, we encourage you to take your nausea medication as directed.  BELOW ARE SYMPTOMS THAT SHOULD BE REPORTED IMMEDIATELY: *FEVER GREATER THAN 100.4 F (38 C) OR HIGHER *CHILLS OR SWEATING *NAUSEA AND VOMITING THAT IS NOT CONTROLLED WITH YOUR NAUSEA MEDICATION *UNUSUAL SHORTNESS OF BREATH *UNUSUAL BRUISING OR BLEEDING *URINARY PROBLEMS (pain or burning when urinating, or frequent urination) *BOWEL PROBLEMS (unusual diarrhea, constipation, pain near the anus) TENDERNESS IN MOUTH AND THROAT WITH OR WITHOUT PRESENCE OF ULCERS (sore throat, sores in mouth, or a toothache) UNUSUAL RASH, SWELLING OR PAIN  UNUSUAL VAGINAL DISCHARGE OR ITCHING   Items with * indicate a potential emergency and should be followed up as soon as possible or go to the Emergency Department if any problems should occur.  Please show the CHEMOTHERAPY ALERT CARD or IMMUNOTHERAPY ALERT CARD at  check-in to the Emergency Department and triage nurse.  Should you have questions after your visit or need to cancel or reschedule your appointment, please contact [facility]  [phone] and follow the prompts.  Office hours are 8:00 a.m. to 4:30 p.m. Monday - Friday. Please note that voicemails left after 4:00 p.m. may not be returned until the following business day.  We are closed weekends and major holidays. You have access to a nurse at all times for urgent questions. Please call the main number to the clinic [phone] and follow the prompts.  For any non-urgent questions, you may also contact your provider using MyChart. We now offer e-Visits for anyone 73 and older to request care online for non-urgent symptoms. For details visit [email or web address].   Also download the MyChart app! Go to the app store, search "MyChart", open the app, select [facility], and log in with your MyChart username and password.  Due to Covid, a mask is required upon entering the hospital/clinic. If you do not have a mask, one will be given to you upon arrival. For doctor visits, patients may have 1 support person aged 24 or older with them. For treatment visits, patients cannot have anyone with them due to current Covid guidelines and our immunocompromised population.

## 2020-12-10 NOTE — Assessment & Plan Note (Addendum)
#  Stage IV -non-small cell favor adenocarcinoma; September 2022 PET scan - hypermetabolic solid pulmonary nodule of the left lower lobe, favor primary lung malignancy; numerous hypermetabolic nodules left lower lobe pleura; subcarinal/left hilar lymph nodes-positive for metastatic disease.  Bilateral sub-solid nodules compared to CT April 2021.  No evidence of abdomen pelvis metastases or bone metastasis.  NGS pending.  #Proceed with chemotherapy-carbo Alimta [Keytruda not approved by insurance; awaiting NGS]; s/p B20 on folic acid/dexamethasone treatment  # Again reviewed with the patient the potential side effects of chemotherapy; and goal of treatment being palliative.  # MRI brain-punctate lesion-asymptomatic [D/w- Dr.Vaslow]; will repeat short-term imaging in 2 months [SRS protocol] or so.  # Left chest wall pain: STOP  Prednisone.  Not well controlled. hydrocodonel every 6 hours as needed.  #COPD: Noncompliant with inhalers.  Recommend follow up with pulmonary.   #Diabetes-A1c 6.9 [AUG 2022]-not on medications; diet controlled; BG-133; recommend glucose monitor/test strips  # Mediport placement-stable  #DISPOSITION: # Carbo Alimta- today # 3 week- [facility] labs- cbc/cmp; Carbo-Alimta-keytruda Dr.B

## 2020-12-10 NOTE — Patient Instructions (Signed)
#  Check blood sugars 2-3 times a day; fasting; after lunch; after dinner.  Call us if blood sugars greater than 250-300 consistently.cut down carbs and sugars.

## 2020-12-10 NOTE — Addendum Note (Signed)
Addended by: Telford Nab on: 12/10/2020 09:53 AM   Modules accepted: Orders

## 2020-12-11 ENCOUNTER — Encounter: Payer: Self-pay | Admitting: Internal Medicine

## 2020-12-11 ENCOUNTER — Telehealth: Payer: Self-pay

## 2020-12-11 NOTE — Telephone Encounter (Signed)
Telephone call to patient for follow up after receiving first infusion.   Patient states infusion went great.  States eating good and drinking plenty of fluids.   Denies any nausea or vomiting.  Encouraged patient to call for any concerns or questions. 

## 2020-12-15 ENCOUNTER — Telehealth: Payer: Self-pay | Admitting: *Deleted

## 2020-12-15 NOTE — Telephone Encounter (Signed)
Pt asking how often she should be checking her blood sugar after receiving chemo. Every day or only the days she is receiving chemo?  Please advise.

## 2020-12-15 NOTE — Telephone Encounter (Signed)
Felicia Manning- pt needs frequent daily monitoring of blood glucose levels. Pls inform patient.

## 2020-12-15 NOTE — Telephone Encounter (Signed)
Pt made aware of MD recommendations.

## 2020-12-16 ENCOUNTER — Other Ambulatory Visit: Payer: Self-pay

## 2020-12-16 ENCOUNTER — Inpatient Hospital Stay (HOSPITAL_BASED_OUTPATIENT_CLINIC_OR_DEPARTMENT_OTHER): Payer: 59 | Admitting: Nurse Practitioner

## 2020-12-16 ENCOUNTER — Inpatient Hospital Stay: Payer: 59

## 2020-12-16 ENCOUNTER — Telehealth: Payer: Self-pay | Admitting: *Deleted

## 2020-12-16 ENCOUNTER — Inpatient Hospital Stay: Payer: 59 | Attending: Nurse Practitioner

## 2020-12-16 VITALS — BP 93/70 | HR 69 | Temp 98.9°F | Resp 18 | Wt 191.0 lb

## 2020-12-16 DIAGNOSIS — R918 Other nonspecific abnormal finding of lung field: Secondary | ICD-10-CM

## 2020-12-16 DIAGNOSIS — Z5111 Encounter for antineoplastic chemotherapy: Secondary | ICD-10-CM | POA: Insufficient documentation

## 2020-12-16 DIAGNOSIS — Z95828 Presence of other vascular implants and grafts: Secondary | ICD-10-CM

## 2020-12-16 DIAGNOSIS — E119 Type 2 diabetes mellitus without complications: Secondary | ICD-10-CM | POA: Insufficient documentation

## 2020-12-16 DIAGNOSIS — C3432 Malignant neoplasm of lower lobe, left bronchus or lung: Secondary | ICD-10-CM | POA: Insufficient documentation

## 2020-12-16 DIAGNOSIS — J449 Chronic obstructive pulmonary disease, unspecified: Secondary | ICD-10-CM | POA: Insufficient documentation

## 2020-12-16 DIAGNOSIS — B370 Candidal stomatitis: Secondary | ICD-10-CM | POA: Diagnosis not present

## 2020-12-16 DIAGNOSIS — Z79899 Other long term (current) drug therapy: Secondary | ICD-10-CM | POA: Insufficient documentation

## 2020-12-16 DIAGNOSIS — C782 Secondary malignant neoplasm of pleura: Secondary | ICD-10-CM | POA: Diagnosis not present

## 2020-12-16 DIAGNOSIS — Z87891 Personal history of nicotine dependence: Secondary | ICD-10-CM | POA: Diagnosis not present

## 2020-12-16 DIAGNOSIS — R197 Diarrhea, unspecified: Secondary | ICD-10-CM

## 2020-12-16 LAB — COMPREHENSIVE METABOLIC PANEL
ALT: 34 U/L (ref 0–44)
AST: 20 U/L (ref 15–41)
Albumin: 3.4 g/dL — ABNORMAL LOW (ref 3.5–5.0)
Alkaline Phosphatase: 60 U/L (ref 38–126)
Anion gap: 6 (ref 5–15)
BUN: 16 mg/dL (ref 8–23)
CO2: 28 mmol/L (ref 22–32)
Calcium: 8.3 mg/dL — ABNORMAL LOW (ref 8.9–10.3)
Chloride: 102 mmol/L (ref 98–111)
Creatinine, Ser: 0.74 mg/dL (ref 0.44–1.00)
GFR, Estimated: >60 mL/min (ref >60)
Glucose, Bld: 149 mg/dL — ABNORMAL HIGH (ref 70–99)
Potassium: 3.8 mmol/L (ref 3.5–5.1)
Sodium: 136 mmol/L (ref 135–145)
Total Bilirubin: 0.6 mg/dL (ref 0.3–1.2)
Total Protein: 6.3 g/dL — ABNORMAL LOW (ref 6.5–8.1)

## 2020-12-16 LAB — CBC WITH DIFFERENTIAL/PLATELET
Abs Immature Granulocytes: 0.03 10*3/uL (ref 0.00–0.07)
Basophils Absolute: 0 10*3/uL (ref 0.0–0.1)
Basophils Relative: 0 %
Eosinophils Absolute: 0.5 10*3/uL (ref 0.0–0.5)
Eosinophils Relative: 7 %
HCT: 37.1 % (ref 36.0–46.0)
Hemoglobin: 12 g/dL (ref 12.0–15.0)
Immature Granulocytes: 0 %
Lymphocytes Relative: 26 %
Lymphs Abs: 1.9 10*3/uL (ref 0.7–4.0)
MCH: 29.4 pg (ref 26.0–34.0)
MCHC: 32.3 g/dL (ref 30.0–36.0)
MCV: 90.9 fL (ref 80.0–100.0)
Monocytes Absolute: 0.1 10*3/uL (ref 0.1–1.0)
Monocytes Relative: 2 %
Neutro Abs: 4.7 10*3/uL (ref 1.7–7.7)
Neutrophils Relative %: 65 %
Platelets: 150 10*3/uL (ref 150–400)
RBC: 4.08 MIL/uL (ref 3.87–5.11)
RDW: 15.5 % (ref 11.5–15.5)
WBC: 7.3 10*3/uL (ref 4.0–10.5)
nRBC: 0 % (ref 0.0–0.2)

## 2020-12-16 LAB — MAGNESIUM: Magnesium: 1.7 mg/dL (ref 1.7–2.4)

## 2020-12-16 MED ORDER — HEPARIN SOD (PORK) LOCK FLUSH 100 UNIT/ML IV SOLN
500.0000 [IU] | Freq: Once | INTRAVENOUS | Status: AC
  Administered 2020-12-16: 500 [IU] via INTRAVENOUS
  Filled 2020-12-16: qty 5

## 2020-12-16 MED ORDER — SODIUM CHLORIDE 0.9% FLUSH
10.0000 mL | Freq: Once | INTRAVENOUS | Status: DC
  Filled 2020-12-16: qty 10

## 2020-12-16 MED ORDER — FLUCONAZOLE 200 MG PO TABS: 200.0000 mg | ORAL_TABLET | Freq: Every day | ORAL | 0 refills | Status: AC

## 2020-12-16 MED ORDER — NYSTATIN 100000 UNIT/ML MT SUSP
OROMUCOSAL | 0 refills | Status: DC
Start: 2020-12-16 — End: 2022-01-26

## 2020-12-16 MED ORDER — SODIUM CHLORIDE 0.9 % IV SOLN
INTRAVENOUS | Status: DC
  Filled 2020-12-16 (×2): qty 250

## 2020-12-16 NOTE — Progress Notes (Deleted)
Pt presents to [facility] with complaints of diarrhea and mouth tenderness x4 days. Also noted, open area to corner of mouth. Denies open lesions inside of mouth.

## 2020-12-16 NOTE — Telephone Encounter (Signed)
Pt needs to see Np in [facility]

## 2020-12-16 NOTE — Progress Notes (Signed)
Pt presents to [facility] with complaints of diarrhea and mouth tenderness x4 days. Also noted, open area to corner of mouth. Denies open lesions inside of mouth.

## 2020-12-16 NOTE — Telephone Encounter (Signed)
Patient called reporting that she is having mouth sores for several days and she is using Biotene, but she only realized yesterday that she is supposed to reduce the wear time of her dentures and she has not noticed that the biotene is helping, in fact she has now developed a crack in the corner of her mouth. She is also reporting the she has diarrhea since last week for which she has only tried dietary modifications to control with no success and her bottom is now raw and bleeding. She states that she is able to eat soft foods and drinking well. Please advise

## 2020-12-16 NOTE — Telephone Encounter (Signed)
Pt contacted and agrees to [facility] visit this afternoon. Appointments scheduled.

## 2020-12-16 NOTE — Progress Notes (Signed)
Symptom Management [place]  Telephone:([phone] [phone]  Patient Care Team: Dion Body, MD as PCP - General (Family Medicine) Leata Mouse (Inactive) Byrnett, Forest Gleason, MD (General Surgery) Telford Nab, RN as Oncology Nurse Navigator   Name of the patient: Felicia Manning  [id number]  [birth year]   Date of visit: 12/16/20  Diagnosis-lung cancer  Chief complaint/ Reason for visit-mouth sores  Heme/Onc history:  Oncology History Overview Note  IMPRESSION: Hypermetabolic solid pulmonary nodule of the left lower lobe, favor primary lung malignancy.   Numerous hypermetabolic nodules of the lower left pleura, compatible with pleural metastatic disease.   Hypermetabolic subcarinal and left hilar lymph nodes, compatible with metastatic disease.   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 Recommend attention on follow-up.   Mild asymmetric FDG uptake below mediastinal blood pool within a small nodule of the left parotid gland, favored to be benign. Recommend attention on follow-up.   No evidence of metastatic disease in the abdomen or pelvis.   No evidence of osseous metastatic disease.   ---------------------  # MARCH 2022- incidental LLL [ 0.9 cm] s/p COVID vaccine; [Dr.Aleskerov]; July 2022- left pleural pain-   COPD-non-complaint    ------------------   DIAGNOSIS:  A. PLEURAL MASS, LEFT CHEST; BIOPSY:  - DIAGNOSTIC OF MALIGNANCY.  - NON-SMALL CELL CARCINOMA, FAVOR ADENOCARCINOMA.   Comment:  The tumor cells are positive for CK7 and TTF1 (patchy).  They are  negative for p40.   # 12/10/2020-carbo Alimta; cycle #1 [Keytruda pending insurance]  #NGS PD-L1 TPS 1%   Malignant neoplasm of thyroid gland  ([place])  05/02/2015 Initial Diagnosis   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HCC)   Cancer of lower lobe of left lung ([place])  12/02/2020 Initial Diagnosis   Cancer of lower lobe of left lung ([place])   12/10/2020 Cancer Staging   Staging form: Lung, AJCC 8th Edition - Clinical: Stage IVA (cT4, cN3, cM1a) - Signed by Cammie Sickle, MD on 12/10/2020    12/10/2020 -  Chemotherapy   Patient is on Treatment Plan : LUNG CARBOplatin / Pemetrexed / Pembrolizumab q21d Induction x 4 cycles / Maintenance Pemetrexed + Pembrolizumab       Interval history-patient is 65 year old female currently undergoing chemotherapy with carboplatin and Alimta.  Keytruda currently on hold awaiting insurance approval.  Recently on Decadron and prednisone as part of her treatments.  History of diabetes.  Which dentures.  She presents to symptom management clinic for complaints of mouth sores and cracking and redness at the corners of her mouth.  Symptoms impairing her appetite and oral intake.  She was recently diagnosed with diabetes and has received a glucose monitor but is unsure how to use it.  Has not yet been checking her blood sugar.  Additionally, complaints of diarrhea with associated malodorous flatulence and abdominal pressure.  Review of systems- Review of Systems  Constitutional:  Positive for malaise/fatigue. Negative for chills, fever and weight loss.  HENT:  Positive for sore throat. Negative for hearing loss, nosebleeds and tinnitus.   Eyes:  Negative for blurred vision and double vision.  Respiratory:  Negative for cough, hemoptysis, shortness of breath and wheezing.   Cardiovascular:  Negative for chest pain, palpitations and leg swelling.  Gastrointestinal:  Negative for abdominal pain, blood in stool, constipation, diarrhea, melena, nausea and  vomiting.  Genitourinary:  Negative for dysuria and urgency.  Musculoskeletal:  Negative for back pain, falls, joint pain and myalgias.  Skin:  Negative  for itching and rash.  Neurological:  Negative for dizziness, tingling, sensory change, loss of consciousness, weakness and headaches.  Endo/Heme/Allergies:  Negative for environmental allergies. Does not bruise/bleed easily.  Psychiatric/Behavioral:  Negative for depression. The patient is not nervous/anxious and does not have insomnia.     No Known Allergies  Past Medical History:  Diagnosis Date   Barrett's esophagus    Dysrhythmia    stress related at work.   GERD (gastroesophageal reflux disease)    Hepatitis C 2008   Hyperlipidemia    Hypertension    Hypothyroidism    Nephrolithiasis    Non-small cell carcinoma of left lung (HCC)    Osteoporosis    Pulmonary mass    Thyroid cancer ([place]) 2012   Thyroid cancer [facility])     Past Surgical History:  Procedure Laterality Date   ABDOMINAL HYSTERECTOMY  2006   COLONOSCOPY WITH PROPOFOL N/A 04/25/2015   Procedure: COLONOSCOPY WITH PROPOFOL;  Surgeon: Josefine Class, MD;  Location: [facility] ENDOSCOPY;  Service: Endoscopy;  Laterality: N/A;   CYSTOSCOPY W/ RETROGRADES Bilateral 05/19/2015   Procedure: CYSTOSCOPY WITH RETROGRADE PYELOGRAM;  Surgeon: Hollice Espy, MD;  Location: [facility] ORS;  Service: Urology;  Laterality: Bilateral;   CYSTOSCOPY W/ URETERAL STENT PLACEMENT Right 05/26/2015   Procedure: CYSTOSCOPY WITH STENT REPLACEMENT;  Surgeon: Hollice Espy, MD;  Location: [facility] ORS;  Service: Urology;  Laterality: Right;   CYSTOSCOPY WITH STENT PLACEMENT Right 05/19/2015   Procedure: CYSTOSCOPY WITH STENT PLACEMENT;  Surgeon: Hollice Espy, MD;  Location: [facility] ORS;  Service: Urology;  Laterality: Right;   CYSTOSCOPY WITH STENT PLACEMENT Right 05/26/2015   Procedure: CYSTOSCOPY WITH STENT PLACEMENT;  Surgeon: Hollice Espy, MD;  Location: [facility] ORS;  Service: Urology;  Laterality: Right;   CYSTOSCOPY WITH STENT PLACEMENT Right 06/25/2016   Procedure: CYSTOSCOPY WITH STENT PLACEMENT;  Surgeon: Nickie Retort, MD;  Location: [facility] ORS;   Service: Urology;  Laterality: Right;   CYSTOSCOPY WITH STENT PLACEMENT Left 10/31/2017   Procedure: CYSTOSCOPY WITH STENT PLACEMENT;  Surgeon: Hollice Espy, MD;  Location: [facility] ORS;  Service: Urology;  Laterality: Left;   CYSTOSCOPY/URETEROSCOPY/HOLMIUM LASER/STENT PLACEMENT Left 07/23/2015   Procedure: CYSTOSCOPY/URETEROSCOPY/HOLMIUM LASER/STENT PLACEMENT/RETROGRADE PYELOGRAM;  Surgeon: Hollice Espy, MD;  Location: [facility] ORS;  Service: Urology;  Laterality: Left;   CYSTOSCOPY/URETEROSCOPY/HOLMIUM LASER/STENT PLACEMENT Left 11/16/2017   Procedure: CYSTOSCOPY/URETEROSCOPY/HOLMIUM LASER/STENT Exchange;  Surgeon: Hollice Espy, MD;  Location: [facility] ORS;  Service: Urology;  Laterality: Left;   ESOPHAGOGASTRODUODENOSCOPY (EGD) WITH PROPOFOL N/A 04/25/2015   Procedure: ESOPHAGOGASTRODUODENOSCOPY (EGD) WITH PROPOFOL;  Surgeon: Josefine Class, MD;  Location: [facility] ENDOSCOPY;  Service: Endoscopy;  Laterality: N/A;   EYE SURGERY Bilateral 1964   lazy eye repair   EYE SURGERY Bilateral 2001   lazy eye repair   IR IMAGING GUIDED PORT INSERTION  12/08/2020   LITHOTRIPSY  2009   THYROIDECTOMY  2010   URETEROSCOPY WITH HOLMIUM LASER LITHOTRIPSY Right 05/19/2015   Procedure: URETEROSCOPY WITH HOLMIUM LASER LITHOTRIPSY;  Surgeon: Hollice Espy, MD;  Location: [facility] ORS;  Service: Urology;  Laterality: Right;   URETEROSCOPY WITH HOLMIUM LASER LITHOTRIPSY Right 05/26/2015   Procedure: URETEROSCOPY WITH HOLMIUM LASER LITHOTRIPSY;  Surgeon: Hollice Espy, MD;  Location: [facility] ORS;  Service: Urology;  Laterality: Right;   URETEROSCOPY WITH HOLMIUM LASER LITHOTRIPSY Right 06/25/2016   Procedure: URETEROSCOPY WITH HOLMIUM LASER LITHOTRIPSY;  Surgeon: Pilar Jarvis,  Horald Pollen, MD;  Location: [facility] ORS;  Service: Urology;  Laterality: Right;    Social History   Socioeconomic History   Marital status: Divorced    Spouse name: Not on file   Number of children: Not on file   Years of education: Not on file   Highest  education level: Not on file  Occupational History   Not on file  Tobacco Use   Smoking status: Every Day    Packs/day: 1.00    Years: 40.00    Pack years: 40.00    Types: Cigarettes   Smokeless tobacco: Never  Vaping Use   Vaping Use: Never used  Substance and Sexual Activity   Alcohol use: Yes    Comment: socially   Drug use: No   Sexual activity: Not Currently    Birth control/protection: None  Other Topics Concern   Not on file  Social History Narrative   Smoking: 1p 1-2 days since 17 years; alcohol: rare; 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     Social Determinants of Health   Financial Resource Strain: Not on file  Food Insecurity: Not on file  Transportation Needs: Not on file  Physical Activity: Not on file  Stress: Not on file  Social Connections: Not on file  Intimate Partner Violence: Not on file    Family History  Problem Relation Age of Onset   CAD Mother    Heart disease Mother    Dementia Father    Bladder Cancer Neg Hx    Prostate cancer Neg Hx    Kidney cancer Neg Hx    Breast cancer Neg Hx      Current Outpatient Medications:    alendronate (FOSAMAX) 70 MG tablet, One tab every Sunday on empty stomach with a full glass of water. Do not lie down or eat/drink anything else for the next 30 min., Disp: , Rfl:    amLODipine (NORVASC) 5 MG tablet, Take 5 mg by mouth every morning. , Disp: , Rfl:    amoxicillin-clavulanate (AUGMENTIN) 875-125 MG tablet, Take 1 tablet by mouth 2 (two) times daily. (Patient not taking: Reported on 12/10/2020), Disp: , Rfl:    atorvastatin (LIPITOR) 80 MG tablet, Take 80 mg by mouth daily., Disp: , Rfl:    blood glucose meter kit and supplies KIT, Dispense based on patient and insurance preference. Use up to three times daily as directed., Disp: 1 each, Rfl: 0   cholecalciferol (VITAMIN D) 1000 units tablet, Take 2,000 Units by mouth daily., Disp: ,  Rfl:    Cranberry-Vitamin C-Probiotic (AZO CRANBERRY PO), Take 1 tablet by mouth daily. (Patient not taking: Reported on 12/10/2020), Disp: , Rfl:    dexamethasone (DECADRON) 4 MG tablet, Take 1 tablet by mouth twice a day the day before chemotherapy, Disp: 30 tablet, Rfl: 0   folic acid (FOLVITE) 1 MG tablet, Take 1 tablet (1 mg total) by mouth daily., Disp: 30 tablet, Rfl: 2   HYDROcodone-acetaminophen (NORCO/VICODIN) 5-325 MG tablet, Take 1 tablet by mouth every 8 (eight) hours as needed for moderate pain., Disp: 60 tablet, Rfl: 0   levothyroxine (SYNTHROID, LEVOTHROID) 150 MCG tablet, Take 150 mcg by mouth at bedtime. , Disp: , Rfl:    lidocaine-prilocaine (EMLA) cream, Apply 1 application topically as needed., Disp: 30 g, Rfl: 0   losartan (COZAAR) 100 MG tablet, Take 100 mg by mouth every morning. , Disp: , Rfl:    metoprolol succinate (TOPROL-XL) 25  MG 24 hr tablet, Take 25 mg by mouth every morning. , Disp: , Rfl: 1   omeprazole (PRILOSEC) 20 MG capsule, Take 20 mg by mouth daily before breakfast. , Disp: , Rfl:    ondansetron (ZOFRAN) 8 MG tablet, Take 1 tablet (8 mg total) by mouth every 8 (eight) hours as needed for nausea or vomiting. Start on the third day after chemotherapy, Disp: 30 tablet, Rfl: 2   predniSONE (DELTASONE) 10 MG tablet, Take 10 mg by mouth daily., Disp: , Rfl:    prochlorperazine (COMPAZINE) 10 MG tablet, Take 1 tablet (10 mg total) by mouth every 6 (six) hours as needed for nausea or vomiting., Disp: 30 tablet, Rfl: 2   traMADol (ULTRAM) 50 MG tablet, Take 1 tablet (50 mg total) by mouth every 8 (eight) hours as needed., Disp: 60 tablet, Rfl: 0 No current facility-administered medications for this visit.  Facility-Administered Medications Ordered in Other Visits:    0.9 %  sodium chloride infusion, , Intravenous, Continuous, Verlon Au, NP, Last Rate: 999 mL/hr at 12/16/20 1326, New Bag at 12/16/20 1326   heparin lock flush 100 unit/mL, 500 Units, Intravenous,  Once, Verlon Au, NP   sodium chloride flush (NS) 0.9 % injection 10 mL, 10 mL, Intravenous, Once, Verlon Au, NP  Physical exam:  Vitals:   12/16/20 1339  BP: 93/70  Pulse: 69  Resp: 18  Temp: 98.9 F (37.2 C)  TempSrc: Tympanic  SpO2: 98%  Weight: 191 lb (86.6 kg)   Physical Exam Constitutional:      Appearance: She is not ill-appearing.  HENT:     Mouth/Throat:     Comments: White patches and surrounding erythema across all buccal surfaces. Fissures at angles of mouth.  Cardiovascular:     Rate and Rhythm: Normal rate and regular rhythm.  Pulmonary:     Effort: Pulmonary effort is normal. No respiratory distress.  Skin:    General: Skin is warm and dry.  Neurological:     Mental Status: She is alert and oriented to person, place, and time.  Psychiatric:        Mood and Affect: Mood normal.        Behavior: Behavior normal.     CMP Latest Ref Rng & Units 12/10/2020  Glucose 70 - 99 mg/dL 133(H)  BUN 8 - 23 mg/dL 16  Creatinine 0.44 - 1.00 mg/dL 0.73  Sodium 135 - 145 mmol/L 137  Potassium 3.5 - 5.1 mmol/L 4.3  Chloride 98 - 111 mmol/L 102  CO2 22 - 32 mmol/L 25  Calcium 8.9 - 10.3 mg/dL 9.1  Total Protein 6.5 - 8.1 g/dL 7.5  Total Bilirubin 0.3 - 1.2 mg/dL 0.6  Alkaline Phos 38 - 126 U/L 68  AST 15 - 41 U/L 15  ALT 0 - 44 U/L 22   CBC Latest Ref Rng & Units 12/10/2020  WBC 4.0 - 10.5 K/uL 17.5(H)  Hemoglobin 12.0 - 15.0 g/dL 13.3  Hematocrit 36.0 - 46.0 % 40.5  Platelets 150 - 400 K/uL 278    No images are attached to the encounter.  MR Brain W Wo Contrast  Result Date: 11/17/2020 CLINICAL DATA:  Mass of lower lobe of left lung. Metastatic disease evaluation; recent headaches. EXAM: MRI HEAD WITHOUT AND WITH CONTRAST TECHNIQUE: Multiplanar, multiecho pulse sequences of the brain and surrounding structures were obtained without and with intravenous contrast. CONTRAST:  7.25m GADAVIST GADOBUTROL 1 MMOL/ML IV SOLN COMPARISON:  Head CT 08/13/2017.  PET-CT 11/05/2020.  FINDINGS: Brain: Mild generalized parenchymal atrophy. Asymmetric left hippocampal volume loss and T2 FLAIR hyperintense signal abnormality (series 15, image 21) (series 17, image 14). Mild multifocal T2 FLAIR hyperintense signal abnormality within the cerebral white matter, nonspecific but compatible with chronic small vessel ischemic disease. There is no acute infarct. No extra-axial fluid collection. No midline shift. Apparent punctate focus of enhancement within the right cerebellar hemisphere, appreciated on the coronal T1 weighted postcontrast sequence only (series 19, image 9). Vascular: Maintained flow voids within the proximal large arterial vessels. Skull and upper cervical spine: No focal suspicious marrow lesion. Sinuses/Orbits: Visualized orbits show no acute finding. Trace bilateral ethmoid sinus mucosal thickening. Impressions #1 and #2 will be called to the ordering clinician or representative by the Radiologist Assistant, and communication documented in the PACS or [employer]. IMPRESSION: Apparent punctate focus of enhancement within the right cerebellar hemisphere, appreciated on the coronal T1 weighted postcontrast sequence only. While this may reflect a focus of vascular enhancement or artifact, a punctate metastasis cannot be excluded. Short-interval 4-6 week brain MRI follow-up with contrast recommended. Asymmetric left hippocampal volume loss and T2 FLAIR hyperintense signal abnormality. If the patient has a history of seizures, this may reflect mesial temporal sclerosis. Alternatively, this may reflect sequela of a nonspecific remote insult. Mild chronic small-vessel ischemic changes within the cerebral white matter. Mild generalized parenchymal atrophy. Electronically Signed   By: Kellie Simmering D.O.   On: 11/17/2020 08:57   CT BIOPSY  Result Date: 11/25/2020 CLINICAL DATA:  Left lower lobe lung mass, left-sided pleural masses and metastatic lymphadenopathy in the  left hilum and mediastinum by prior imaging. Pleural masses including inferolateral masses abutting left-sided ribs are hypermetabolic by PET and the patient presents for pleural mass biopsy. EXAM: CT GUIDED CORE BIOPSY OF LEFT PLEURAL MASS ANESTHESIA/SEDATION: Moderate (conscious) sedation was employed during this procedure. A total of Versed 1.0 mg and Fentanyl 50 mcg was administered intravenously by radiology nursing under my direct supervision. Moderate Sedation Time: 19 minutes. The patient's level of consciousness and vital signs were monitored continuously by radiology nursing throughout the procedure under my direct supervision. PROCEDURE: The procedure risks, benefits, and alternatives were explained to the patient. Questions regarding the procedure were encouraged and answered. The patient understands and consents to the procedure. A time-out was performed prior to initiating the procedure. CT was performed in a supine position with the left side rolled up slightly through the lower chest and upper abdomen. The left lower lateral chest wall and contiguous upper abdominal wall was prepped with chlorhexidine in a sterile fashion, and a sterile drape was applied covering the operative field. A sterile gown and sterile gloves were used for the procedure. Local anesthesia was provided with 1% Lidocaine. Left inferolateral pleural mass was targeted. Under CT guidance, a 17 gauge trocar needle was advanced to the level of a mass. After confirming needle tip position, coaxial 18 gauge core biopsy samples were obtained. Three separate core biopsy samples were obtained and placed in formalin. Additional CT imaging was performed after outer needle removal. COMPLICATIONS: None FINDINGS: Based on the PET scan, the plaque like pleural mass in the far inferolateral left pleural space abutting the distal aspect of the left ninth rib was targeted in an area soft tissue thickening demonstrating abnormal hypermetabolism on  the PET scan. The area of pleural mass measures roughly 1.5 x 4.2 cm in transverse dimensions on axial CT. Solid tissue was obtained. There were no complications evident on immediate postprocedural CT.  IMPRESSION: CT-guided core biopsy of left pleural mass in the far inferolateral pleural space abutting the distal aspect of the left ninth rib. Solid tissue was obtained. Electronically Signed   By: Aletta Edouard M.D.   On: 11/25/2020 14:08   IR IMAGING GUIDED PORT INSERTION  Result Date: 12/08/2020 INDICATION: IV access for chemotherapy EXAM: Ultrasound-guided puncture of the right internal jugular vein Placement of a right-sided chest port using fluoroscopic guidance MEDICATIONS: None ANESTHESIA/SEDATION: Moderate (conscious) sedation was employed during this procedure. A total of Versed 2 mg and Fentanyl 100 mcg was administered intravenously. Moderate Sedation Time: 20 minutes. The patient's level of consciousness and vital signs were monitored continuously by radiology nursing throughout the procedure under my direct supervision. FLUOROSCOPY TIME:  Fluoroscopy Time: 36 seconds with 2 exposures COMPLICATIONS: None immediate. PROCEDURE: Informed written consent was obtained from the patient after a thorough discussion of the procedural risks, benefits and alternatives. All questions were addressed. Maximal Sterile Barrier Technique was utilized including caps, mask, sterile gowns, sterile gloves, sterile drape, hand hygiene and skin antiseptic. A timeout was performed prior to the initiation of the procedure. The patient was placed supine on the exam table. The right neck and chest was prepped and draped in the standard sterile fashion. A preliminary ultrasound of the right neck was performed and demonstrates a patent right internal jugular vein. A permanent ultrasound image was stored in the electronic medical record. The overlying skin was anesthetized with 1% Lidocaine. Using ultrasound guidance, access  was obtained into the right internal jugular vein using a 21 gauge micropuncture set. A wire was advanced into the SVC, a short incision was made at the puncture site, and serial dilatation performed. Next, in an ipsilateral infraclavicular location, an incision was made at the site of the subcutaneous reservoir. Blunt dissection was used to open a pocket to contain the reservoir. A subcutaneous tunnel was then created from the port site to the puncture site. A(n) 8 Fr single lumen catheter was advanced through the tunnel. The catheter was attached to the port and this was placed in the subcutaneous pocket. Under fluoroscopic guidance, a peel away sheath was placed, and the catheter was trimmed to the appropriate length and was advanced into the central veins. The catheter length is 23 cm. The tip of the catheter lies near the superior cavoatrial junction. The port flushes and aspirates appropriately. The port was flushed and locked with heparinized saline. The port pocket was closed in 2 layers using 3-0 and 4-0 Vicryl/absorbable suture. Dermabond was also applied to both incisions. The patient tolerated the procedure well and was transferred to recovery in stable condition. IMPRESSION: Successful placement of a right-sided chest port using the right internal jugular vein. The port is ready for immediate use. Electronically Signed   By: Albin Felling M.D.   On: 12/08/2020 10:37    Assessment and plan- Patient is a 65 y.o. 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  She is currently undergoing chemotherapy with [place] and Alimta.  Keytruda currently on hold awaiting insurance approval.    Oropharyngeal candidiasis-patient has multiple risk factors contributing to her being high risk for oropharyngeal candidiasis including immunosuppression from chemotherapy, frequent high doses of steroids, underlying diabetes, denture  wearing.  Recommend combination of topical and systemic treatment and she may need suppressive therapy while on treatment.  Start Diflucan 200 mg daily for 14 days along with nystatin suspension 5 mils swish  and swallow 4 times a day.  She can follow up with Dr. Rogue Bussing to see if she needs to continue prophylaxis, either diflucan 100 mg daily or topical nystatin. Encouraged blood sugar control (see below), good hygiene.  Diabetes- will bring patient back for nurse visit tomorrow to learn to use her glucose monitor, how to check her blood sugars, and brief diabetes nutrition teaching.  Diarrhea- etiology unclear. Will check for c diff and gi studies. If negative, likely chemotherapy induced and she can use imodium.  Poor oral intake & increased GI losses- IV fluids in clinic today.   Follow up as above or sooner if symptoms don't improve or worsen in the interim.    Visit Diagnosis 1. Oropharyngeal candidiasis   2. Diarrhea, unspecified type   3. Type 2 diabetes mellitus without complication, without long-term current use of insulin ([place])    Patient expressed understanding and was in agreement with this plan. She also understands that She can call clinic at any time with any questions, concerns, or complaints.   Thank you for allowing me to participate in the care of this very pleasant patient.   Beckey Rutter, DNP, AGNP-[facility]  CC: Dr. Rogue Bussing

## 2020-12-17 ENCOUNTER — Encounter: Payer: Self-pay | Admitting: Internal Medicine

## 2020-12-17 ENCOUNTER — Telehealth: Payer: Self-pay

## 2020-12-17 ENCOUNTER — Other Ambulatory Visit: Payer: Self-pay

## 2020-12-17 ENCOUNTER — Inpatient Hospital Stay: Payer: 59

## 2020-12-17 DIAGNOSIS — R197 Diarrhea, unspecified: Secondary | ICD-10-CM

## 2020-12-17 DIAGNOSIS — Z5111 Encounter for antineoplastic chemotherapy: Secondary | ICD-10-CM | POA: Diagnosis not present

## 2020-12-17 LAB — GASTROINTESTINAL PANEL BY PCR, STOOL (REPLACES STOOL CULTURE)

## 2020-12-17 LAB — C DIFFICILE QUICK SCREEN W PCR REFLEX
C Diff antigen: NEGATIVE
C Diff interpretation: NOT DETECTED
C Diff toxin: NEGATIVE

## 2020-12-17 NOTE — Progress Notes (Signed)
Pt here for diabetic education. Reviewed use of diabetic supplies including how to properly obtain a blood sugar reading. Pt was able to demonstrate and successfully verbalize steps. Diabetic diet and food choices also reviewed. Literature provided. All questions answered. Appointment to be scheduled with dietician for further teaching.

## 2020-12-17 NOTE — Telephone Encounter (Signed)
Per Beckey Rutter, stool studies negative. Phoned patient with results and advised her that per Beckey Rutter, she may try imodium OTC, but if does not improve, call us back and we will send Rx. Pt verbalized understanding.

## 2020-12-31 ENCOUNTER — Other Ambulatory Visit: Payer: Self-pay

## 2020-12-31 ENCOUNTER — Encounter: Payer: Self-pay | Admitting: *Deleted

## 2020-12-31 ENCOUNTER — Inpatient Hospital Stay (HOSPITAL_BASED_OUTPATIENT_CLINIC_OR_DEPARTMENT_OTHER): Payer: 59 | Admitting: Internal Medicine

## 2020-12-31 ENCOUNTER — Inpatient Hospital Stay: Payer: 59

## 2020-12-31 ENCOUNTER — Encounter: Payer: Self-pay | Admitting: Internal Medicine

## 2020-12-31 DIAGNOSIS — Z5111 Encounter for antineoplastic chemotherapy: Secondary | ICD-10-CM | POA: Diagnosis not present

## 2020-12-31 DIAGNOSIS — C3432 Malignant neoplasm of lower lobe, left bronchus or lung: Secondary | ICD-10-CM

## 2020-12-31 DIAGNOSIS — E039 Hypothyroidism, unspecified: Secondary | ICD-10-CM

## 2020-12-31 DIAGNOSIS — R918 Other nonspecific abnormal finding of lung field: Secondary | ICD-10-CM

## 2020-12-31 LAB — COMPREHENSIVE METABOLIC PANEL
ALT: 23 U/L (ref 0–44)
AST: 20 U/L (ref 15–41)
Albumin: 3.7 g/dL (ref 3.5–5.0)
Alkaline Phosphatase: 71 U/L (ref 38–126)
Anion gap: 15 (ref 5–15)
BUN: 15 mg/dL (ref 8–23)
CO2: 22 mmol/L (ref 22–32)
Calcium: 8.9 mg/dL (ref 8.9–10.3)
Chloride: 103 mmol/L (ref 98–111)
Creatinine, Ser: 0.68 mg/dL (ref 0.44–1.00)
GFR, Estimated: >60 mL/min (ref >60)
Glucose, Bld: 159 mg/dL — ABNORMAL HIGH (ref 70–99)
Potassium: 3.6 mmol/L (ref 3.5–5.1)
Sodium: 140 mmol/L (ref 135–145)
Total Bilirubin: 0.2 mg/dL — ABNORMAL LOW (ref 0.3–1.2)
Total Protein: 7.4 g/dL (ref 6.5–8.1)

## 2020-12-31 LAB — CBC WITH DIFFERENTIAL/PLATELET
Abs Immature Granulocytes: 0.35 10*3/uL — ABNORMAL HIGH (ref 0.00–0.07)
Basophils Absolute: 0.1 10*3/uL (ref 0.0–0.1)
Basophils Relative: 0 %
Eosinophils Absolute: 0 10*3/uL (ref 0.0–0.5)
Eosinophils Relative: 0 %
HCT: 37.5 % (ref 36.0–46.0)
Hemoglobin: 12.1 g/dL (ref 12.0–15.0)
Immature Granulocytes: 2 %
Lymphocytes Relative: 9 %
Lymphs Abs: 1.5 10*3/uL (ref 0.7–4.0)
MCH: 29.4 pg (ref 26.0–34.0)
MCHC: 32.3 g/dL (ref 30.0–36.0)
MCV: 91 fL (ref 80.0–100.0)
Monocytes Absolute: 0.9 10*3/uL (ref 0.1–1.0)
Monocytes Relative: 5 %
Neutro Abs: 14.4 10*3/uL — ABNORMAL HIGH (ref 1.7–7.7)
Neutrophils Relative %: 84 %
Platelets: 321 10*3/uL (ref 150–400)
RBC: 4.12 MIL/uL (ref 3.87–5.11)
RDW: 16.3 % — ABNORMAL HIGH (ref 11.5–15.5)
WBC: 17.1 10*3/uL — ABNORMAL HIGH (ref 4.0–10.5)
nRBC: 0 % (ref 0.0–0.2)

## 2020-12-31 LAB — TSH: TSH: 0.81 u[IU]/mL (ref 0.350–4.500)

## 2020-12-31 MED ORDER — SODIUM CHLORIDE 0.9 % IV SOLN
200.0000 mg | Freq: Once | INTRAVENOUS | Status: AC
  Administered 2020-12-31: 200 mg via INTRAVENOUS
  Filled 2020-12-31: qty 8

## 2020-12-31 MED ORDER — SODIUM CHLORIDE 0.9 % IV SOLN
500.0000 mg/m2 | Freq: Once | INTRAVENOUS | Status: AC
  Administered 2020-12-31: 1000 mg via INTRAVENOUS
  Filled 2020-12-31: qty 40

## 2020-12-31 MED ORDER — SODIUM CHLORIDE 0.9% FLUSH
10.0000 mL | Freq: Once | INTRAVENOUS | Status: AC
  Administered 2020-12-31: 10 mL via INTRAVENOUS
  Filled 2020-12-31: qty 10

## 2020-12-31 MED ORDER — SODIUM CHLORIDE 0.9 % IV SOLN
Freq: Once | INTRAVENOUS | Status: AC
  Filled 2020-12-31: qty 250

## 2020-12-31 MED ORDER — SODIUM CHLORIDE 0.9 % IV SOLN
150.0000 mg | Freq: Once | INTRAVENOUS | Status: AC
  Administered 2020-12-31: 150 mg via INTRAVENOUS
  Filled 2020-12-31: qty 150

## 2020-12-31 MED ORDER — SODIUM CHLORIDE 0.9 % IV SOLN
10.0000 mg | Freq: Once | INTRAVENOUS | Status: AC
  Administered 2020-12-31: 10 mg via INTRAVENOUS
  Filled 2020-12-31: qty 10

## 2020-12-31 MED ORDER — PALONOSETRON HCL INJECTION 0.25 MG/5ML
0.2500 mg | Freq: Once | INTRAVENOUS | Status: AC
  Administered 2020-12-31: 0.25 mg via INTRAVENOUS
  Filled 2020-12-31: qty 5

## 2020-12-31 MED ORDER — HEPARIN SOD (PORK) LOCK FLUSH 100 UNIT/ML IV SOLN
500.0000 [IU] | Freq: Once | INTRAVENOUS | Status: AC | PRN
  Administered 2020-12-31: 500 [IU]
  Filled 2020-12-31: qty 5

## 2020-12-31 MED ORDER — SODIUM CHLORIDE 0.9 % IV SOLN
628.0000 mg | Freq: Once | INTRAVENOUS | Status: AC
  Administered 2020-12-31: 630 mg via INTRAVENOUS
  Filled 2020-12-31: qty 63

## 2020-12-31 MED ORDER — HEPARIN SOD (PORK) LOCK FLUSH 100 UNIT/ML IV SOLN
INTRAVENOUS | Status: AC
  Filled 2020-12-31: qty 5

## 2020-12-31 NOTE — Progress Notes (Signed)
[place] CONSULT NOTE  Patient Care Team: Dion Body, MD as PCP - General (Family Medicine) Leata Mouse (Inactive) Byrnett, Forest Gleason, MD (General Surgery) Telford Nab, RN as Oncology Nurse Navigator  CHIEF COMPLAINTS/PURPOSE OF CONSULTATION: lung cancer  Oncology History Overview Note  IMPRESSION: Hypermetabolic solid pulmonary nodule of the left lower lobe, favor primary lung malignancy.   Numerous hypermetabolic nodules of the lower left pleura, compatible with pleural metastatic disease.   Hypermetabolic subcarinal and left hilar lymph nodes, compatible with metastatic disease.   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 Recommend attention on follow-up.   Mild asymmetric FDG uptake below mediastinal blood pool within a small nodule of the left parotid gland, favored to be benign. Recommend attention on follow-up.   No evidence of metastatic disease in the abdomen or pelvis.   No evidence of osseous metastatic disease.   ---------------------  # MARCH 2022- incidental LLL [ 0.9 cm] s/p COVID vaccine; [Dr.Aleskerov]; July 2022- left pleural pain-   COPD-non-complaint    ------------------   DIAGNOSIS:  A. PLEURAL MASS, LEFT CHEST; BIOPSY:  - DIAGNOSTIC OF MALIGNANCY.  - NON-SMALL CELL CARCINOMA, FAVOR ADENOCARCINOMA.   Comment:  The tumor cells are positive for CK7 and TTF1 (patchy).  They are  negative for p40.   # 12/10/2020-carbo Alimta; cycle #1 [Keytruda pending insurance]  #NGS PD-L1 TPS 1%   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[place])  05/02/2015 Initial Diagnosis   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HCC)   Cancer of lower lobe of left lung ([place])  12/02/2020 Initial Diagnosis   Cancer of lower lobe of left lung ([place])    12/10/2020 Cancer Staging   Staging form: Lung, AJCC 8th Edition - Clinical: Stage IVA (cT4, cN3, cM1a) - Signed by Cammie Sickle, MD on 12/10/2020    12/10/2020 -  Chemotherapy   Patient is on Treatment Plan : LUNG CARBOplatin / Pemetrexed / Pembrolizumab q21d Induction x 4 cycles / Maintenance Pemetrexed + Pembrolizumab        HISTORY OF PRESENTING ILLNESS: Ambulating independently.  Accompanied by her son.  Felicia Manning 65 y.o.  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  Patient received carboplatin Alimta chemotherapy approximately 3 weeks ago kidney results of NGS.  Patient complains of fatigue 2 to 3 days postchemotherapy.  Also had episode of chills then resolved.   Otherwise no nausea no vomiting.  No fever.  States her left-sided chest wall pain is improved.  Denies any headaches.  Review of Systems  Constitutional:  Positive for malaise/fatigue. Negative for chills, diaphoresis, fever and weight loss.  HENT:  Negative for nosebleeds and sore throat.   Eyes:  Negative for double vision.  Respiratory:  Positive for cough and sputum production. Negative for hemoptysis and shortness of breath.   Cardiovascular:  Negative for palpitations, orthopnea and leg swelling.  Gastrointestinal:  Negative for abdominal pain, blood in stool, constipation, diarrhea, heartburn, melena, nausea and vomiting.  Genitourinary:  Negative for dysuria, frequency and urgency.  Musculoskeletal:  Negative for back pain and joint pain.  Skin: Negative.  Negative for itching and rash.  Neurological:  Negative for dizziness, tingling, focal weakness and weakness.  Endo/Heme/Allergies:  Does not bruise/bleed easily.  Psychiatric/Behavioral:  Negative for depression. The patient is not nervous/anxious and does not have insomnia.     MEDICAL HISTORY:  Past Medical History:   Diagnosis Date   Barrett's esophagus    Dysrhythmia    stress related at work.   GERD (gastroesophageal reflux disease)    Hepatitis C 2008   Hyperlipidemia    Hypertension    Hypothyroidism    Nephrolithiasis    Non-small cell carcinoma of left lung (HCC)    Osteoporosis    Pulmonary mass    Thyroid cancer ([place]) 2012   Thyroid cancer ([place])     SURGICAL HISTORY: Past Surgical History:  Procedure Laterality Date   ABDOMINAL HYSTERECTOMY  2006   COLONOSCOPY WITH PROPOFOL N/A 04/25/2015   Procedure: COLONOSCOPY WITH PROPOFOL;  Surgeon: Josefine Class, MD;  Location: [facility] ENDOSCOPY;  Service: Endoscopy;  Laterality: N/A;   CYSTOSCOPY W/ RETROGRADES Bilateral 05/19/2015   Procedure: CYSTOSCOPY WITH RETROGRADE PYELOGRAM;  Surgeon: Hollice Espy, MD;  Location: [facility] ORS;  Service: Urology;  Laterality: Bilateral;   CYSTOSCOPY W/ URETERAL STENT PLACEMENT Right 05/26/2015   Procedure: CYSTOSCOPY WITH STENT REPLACEMENT;  Surgeon: Hollice Espy, MD;  Location: [facility] ORS;  Service: Urology;  Laterality: Right;   CYSTOSCOPY WITH STENT PLACEMENT Right 05/19/2015   Procedure: CYSTOSCOPY WITH STENT PLACEMENT;  Surgeon: Hollice Espy, MD;  Location: [facility] ORS;  Service: Urology;  Laterality: Right;   CYSTOSCOPY WITH STENT PLACEMENT Right 05/26/2015   Procedure: CYSTOSCOPY WITH STENT PLACEMENT;  Surgeon: Hollice Espy, MD;  Location: [facility] ORS;  Service: Urology;  Laterality: Right;   CYSTOSCOPY WITH STENT PLACEMENT Right 06/25/2016   Procedure: CYSTOSCOPY WITH STENT PLACEMENT;  Surgeon: Nickie Retort, MD;  Location: [facility] ORS;  Service: Urology;  Laterality: Right;   CYSTOSCOPY WITH STENT PLACEMENT Left 10/31/2017   Procedure: CYSTOSCOPY WITH STENT PLACEMENT;  Surgeon: Hollice Espy, MD;  Location: [facility] ORS;  Service: Urology;  Laterality: Left;   CYSTOSCOPY/URETEROSCOPY/HOLMIUM LASER/STENT PLACEMENT Left 07/23/2015   Procedure: CYSTOSCOPY/URETEROSCOPY/HOLMIUM LASER/STENT PLACEMENT/RETROGRADE  PYELOGRAM;  Surgeon: Hollice Espy, MD;  Location: [facility] ORS;  Service: Urology;  Laterality: Left;   CYSTOSCOPY/URETEROSCOPY/HOLMIUM LASER/STENT PLACEMENT Left 11/16/2017   Procedure: CYSTOSCOPY/URETEROSCOPY/HOLMIUM LASER/STENT Exchange;  Surgeon: Hollice Espy, MD;  Location: [facility] ORS;  Service: Urology;  Laterality: Left;   ESOPHAGOGASTRODUODENOSCOPY (EGD) WITH PROPOFOL N/A 04/25/2015   Procedure: ESOPHAGOGASTRODUODENOSCOPY (EGD) WITH PROPOFOL;  Surgeon: Josefine Class, MD;  Location: [facility] ENDOSCOPY;  Service: Endoscopy;  Laterality: N/A;   EYE SURGERY Bilateral 1964   lazy eye repair   EYE SURGERY Bilateral 2001   lazy eye repair   IR IMAGING GUIDED PORT INSERTION  12/08/2020   LITHOTRIPSY  2009   THYROIDECTOMY  2010   URETEROSCOPY WITH HOLMIUM LASER LITHOTRIPSY Right 05/19/2015   Procedure: URETEROSCOPY WITH HOLMIUM LASER LITHOTRIPSY;  Surgeon: Hollice Espy, MD;  Location: [facility] ORS;  Service: Urology;  Laterality: Right;   URETEROSCOPY WITH HOLMIUM LASER LITHOTRIPSY Right 05/26/2015   Procedure: URETEROSCOPY WITH HOLMIUM LASER LITHOTRIPSY;  Surgeon: Hollice Espy, MD;  Location: [facility] ORS;  Service: Urology;  Laterality: Right;   URETEROSCOPY WITH HOLMIUM LASER LITHOTRIPSY Right 06/25/2016   Procedure: URETEROSCOPY WITH HOLMIUM LASER LITHOTRIPSY;  Surgeon: Nickie Retort, MD;  Location: [facility] ORS;  Service: Urology;  Laterality: Right;    SOCIAL HISTORY: Social History   Socioeconomic History   Marital status: Divorced    Spouse name: Not on file   Number of children: Not on file   Years of education: Not on file   Highest education  level: Not on file  Occupational History   Not on file  Tobacco Use   Smoking status: Every Day    Packs/day: 1.00    Years: 40.00    Pack years: 40.00    Types: Cigarettes   Smokeless tobacco: Never  Vaping Use   Vaping Use: Never used  Substance and Sexual Activity   Alcohol use: Yes    Comment: socially   Drug use: No   Sexual  activity: Not Currently    Birth control/protection: None  Other Topics Concern   Not on file  Social History Narrative   Smoking: 1p 1-2 days since 17 years; alcohol: rare; 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     Social Determinants of Health   Financial Resource Strain: Not on file  Food Insecurity: Not on file  Transportation Needs: Not on file  Physical Activity: Not on file  Stress: Not on file  Social Connections: Not on file  Intimate Partner Violence: Not on file    FAMILY HISTORY: Family History  Problem Relation Age of Onset   CAD Mother    Heart disease Mother    Dementia Father    Bladder Cancer Neg Hx    Prostate cancer Neg Hx    Kidney cancer Neg Hx    Breast cancer Neg Hx     ALLERGIES:  has No Known Allergies.  MEDICATIONS:  Current Outpatient Medications  Medication Sig Dispense Refill   alendronate (FOSAMAX) 70 MG tablet One tab every Sunday on empty stomach with a full glass of water. Do not lie down or eat/drink anything else for the next 30 min.     amLODipine (NORVASC) 5 MG tablet Take 5 mg by mouth every morning.      atorvastatin (LIPITOR) 80 MG tablet Take 80 mg by mouth daily.     cholecalciferol (VITAMIN D) 1000 units tablet Take 2,000 Units by mouth daily.     dexamethasone (DECADRON) 4 MG tablet Take 1 tablet by mouth twice a day the day before chemotherapy 30 tablet 0   folic acid (FOLVITE) 1 MG tablet Take 1 tablet (1 mg total) by mouth daily. 30 tablet 2   HYDROcodone-acetaminophen (NORCO/VICODIN) 5-325 MG tablet Take 1 tablet by mouth every 8 (eight) hours as needed for moderate pain. 60 tablet 0   levothyroxine (SYNTHROID) 175 MCG tablet Sat and Sun ON AN EMPTY STOMACH WITH A GLASS OF WATER AT LEAST 30-60 MINUTES BEFORE BREAKFAST     levothyroxine (SYNTHROID, LEVOTHROID) 150 MCG tablet Take 150 mcg by mouth at bedtime.      lidocaine (LIDODERM) 5 % 1 patch daily.      lidocaine-prilocaine (EMLA) cream Apply 1 application topically as needed. 30 g 0   losartan (COZAAR) 100 MG tablet Take 100 mg by mouth every morning.      metoprolol succinate (TOPROL-XL) 25 MG 24 hr tablet Take 25 mg by mouth every morning.   1   nystatin (MYCOSTATIN) 100000 UNIT/ML suspension Swish and swallow. Don't drink fluids immediately following administration. 400 mL 0   omeprazole (PRILOSEC) 20 MG capsule Take 20 mg by mouth daily before breakfast.      amoxicillin-clavulanate (AUGMENTIN) 875-125 MG tablet Take 1 tablet by mouth 2 (two) times daily. (Patient not taking: No sig reported)     blood glucose meter kit and supplies KIT Dispense based on patient and insurance preference. Use up to three times daily as directed. (Patient not  taking: Reported on 12/31/2020) 1 each 0   Cranberry-Vitamin C-Probiotic (AZO CRANBERRY PO) Take 1 tablet by mouth daily. (Patient not taking: No sig reported)     ondansetron (ZOFRAN) 8 MG tablet Take 1 tablet (8 mg total) by mouth every 8 (eight) hours as needed for nausea or vomiting. Start on the third day after chemotherapy (Patient not taking: Reported on 12/31/2020) 30 tablet 2   predniSONE (DELTASONE) 10 MG tablet Take 10 mg by mouth daily. (Patient not taking: Reported on 12/31/2020)     prochlorperazine (COMPAZINE) 10 MG tablet Take 1 tablet (10 mg total) by mouth every 6 (six) hours as needed for nausea or vomiting. (Patient not taking: Reported on 12/31/2020) 30 tablet 2   traMADol (ULTRAM) 50 MG tablet Take 1 tablet (50 mg total) by mouth every 8 (eight) hours as needed. (Patient not taking: Reported on 12/31/2020) 60 tablet 0   No current facility-administered medications for this visit.   Facility-Administered Medications Ordered in Other Visits  Medication Dose Route Frequency Provider Last Rate Last Admin   heparin lock flush 100 UNIT/ML injection               .  PHYSICAL EXAMINATION: ECOG PERFORMANCE STATUS: 1 - Symptomatic but  completely ambulatory  Vitals:   12/31/20 0921  BP: 111/74  Pulse: 81  Resp: 18  Temp: 98 F (36.7 C)  SpO2: 97%    Filed Weights   12/31/20 0921  Weight: 189 lb (85.7 kg)     Physical Exam Vitals and nursing note reviewed.  HENT:     Head: Normocephalic and atraumatic.     Mouth/Throat:     Pharynx: Oropharynx is clear.  Eyes:     Extraocular Movements: Extraocular movements intact.     Pupils: Pupils are equal, round, and reactive to light.  Cardiovascular:     Rate and Rhythm: Normal rate and regular rhythm.  Pulmonary:     Comments: Decreased breath sounds bilaterally.  Abdominal:     Palpations: Abdomen is soft.  Musculoskeletal:        General: Normal range of motion.     Cervical back: Normal range of motion.  Skin:    General: Skin is warm.  Neurological:     General: No focal deficit present.     Mental Status: She is alert and oriented to person, place, and time.  Psychiatric:        Behavior: Behavior normal.        Judgment: Judgment normal.     LABORATORY DATA:  I have reviewed the data as listed Lab Results  Component Value Date   WBC 17.1 (H) 12/31/2020   HGB 12.1 12/31/2020   HCT 37.5 12/31/2020   MCV 91.0 12/31/2020   PLT 321 12/31/2020   Recent Labs    12/10/20 0811 12/16/20 1311 12/31/20 0835  NA 137 136 140  K 4.3 3.8 3.6  CL 102 102 103  CO2 25 28 22   GLUCOSE 133* 149* 159*  BUN 16 16 15   CREATININE 0.73 0.74 0.68  CALCIUM 9.1 8.3* 8.9  GFRNONAA >60 >60 >60  PROT 7.5 6.3* 7.4  ALBUMIN 4.0 3.4* 3.7  AST 15 20 20   ALT 22 34 23  ALKPHOS 68 60 71  BILITOT 0.6 0.6 0.2*    RADIOGRAPHIC STUDIES: I have personally reviewed the radiological images as listed and agreed with the findings in the report. IR IMAGING GUIDED PORT INSERTION  Result Date: 12/08/2020 INDICATION: IV access for  chemotherapy EXAM: Ultrasound-guided puncture of the right internal jugular vein Placement of a right-sided chest port using fluoroscopic  guidance MEDICATIONS: None ANESTHESIA/SEDATION: Moderate (conscious) sedation was employed during this procedure. A total of Versed 2 mg and Fentanyl 100 mcg was administered intravenously. Moderate Sedation Time: 20 minutes. The patient's level of consciousness and vital signs were monitored continuously by radiology nursing throughout the procedure under my direct supervision. FLUOROSCOPY TIME:  Fluoroscopy Time: 36 seconds with 2 exposures COMPLICATIONS: None immediate. PROCEDURE: Informed written consent was obtained from the patient after a thorough discussion of the procedural risks, benefits and alternatives. All questions were addressed. Maximal Sterile Barrier Technique was utilized including caps, mask, sterile gowns, sterile gloves, sterile drape, hand hygiene and skin antiseptic. A timeout was performed prior to the initiation of the procedure. The patient was placed supine on the exam table. The right neck and chest was prepped and draped in the standard sterile fashion. A preliminary ultrasound of the right neck was performed and demonstrates a patent right internal jugular vein. A permanent ultrasound image was stored in the electronic medical record. The overlying skin was anesthetized with 1% Lidocaine. Using ultrasound guidance, access was obtained into the right internal jugular vein using a 21 gauge micropuncture set. A wire was advanced into the SVC, a short incision was made at the puncture site, and serial dilatation performed. Next, in an ipsilateral infraclavicular location, an incision was made at the site of the subcutaneous reservoir. Blunt dissection was used to open a pocket to contain the reservoir. A subcutaneous tunnel was then created from the port site to the puncture site. A(n) 8 Fr single lumen catheter was advanced through the tunnel. The catheter was attached to the port and this was placed in the subcutaneous pocket. Under fluoroscopic guidance, a peel away sheath was placed,  and the catheter was trimmed to the appropriate length and was advanced into the central veins. The catheter length is 23 cm. The tip of the catheter lies near the superior cavoatrial junction. The port flushes and aspirates appropriately. The port was flushed and locked with heparinized saline. The port pocket was closed in 2 layers using 3-0 and 4-0 Vicryl/absorbable suture. Dermabond was also applied to both incisions. The patient tolerated the procedure well and was transferred to recovery in stable condition. IMPRESSION: Successful placement of a right-sided chest port using the right internal jugular vein. The port is ready for immediate use. Electronically Signed   By: Albin Felling M.D.   On: 12/08/2020 10:37    ASSESSMENT & PLAN:   Cancer of lower lobe of left lung ([place]) #Stage IV -non-small cell favor adenocarcinoma; September 2022 PET scan - hypermetabolic solid pulmonary nodule of the left lower lobe, favor primary lung malignancy; numerous hypermetabolic nodules left lower lobe pleura; subcarinal/left hilar lymph nodes-positive for metastatic disease.  No evidence of abdomen pelvis metastases or bone metastasis.  NGS- PDL-1; NO targets  #Proceed with chemotherapy-carbo Alimta' add Keytruda with cycle #2 today. Labs today reviewed;  acceptable for treatment today.   I discussed the mechanism of action; Discussed the potential side effects of immunotherapy including but not limited to diarrhea; skin rash; elevated LFTs/endocrine abnormalities etc. however risk of serious life-threatening side effects is less than 10%.  # MRI brain-punctate lesion-asymptomatic [D/w- Dr.Vaslow]; will repeat short-term imaging in 2 months [SRS protocol] or so. Will order at next visit.   # Left chest wall pain:-improved; using hydrocodone  #COPD: Recommend continued inhalers.  #Diabetes-A1c 6.9 [AUG 2022]-preprandial  blood sugars mostly 120s 130s.;  Recommend postprandial blood sugars-if elevated would  recommend oral hypoglycemic drugs.  # Mediport placement-stable  # Prognosis: I reviewed the NGS with the patient and son in detail.  Discussed that no targetable mutations noted.  Understands that radiation could be offered based upon the brain MRI follow-up.  No role for any radiation to the lungs at this time.  And also no role for surgery.Discussed that the median survival is about 16 to 18 months. However, patient understands that these numbers do not belong to her specific situation.  Understands treatments are palliative and not curative.  #DISPOSITION: # Chemo today # 3 week- MD- labs- cbc/cmp; Carbo-Alimta-keytruda; CT chest prior-  Dr.B         All questions were answered. The patient knows to call the clinic with any problems, questions or concerns.       Cammie Sickle, MD 12/31/2020 1:19 PM

## 2020-12-31 NOTE — Progress Notes (Signed)
Met with patient during infusion. All questions answered during visit. No barriers or needs at this time. Instructed pt to call with any further questions or needs. Pt verbalized understanding.

## 2020-12-31 NOTE — Assessment & Plan Note (Addendum)
#  Stage IV -non-small cell favor adenocarcinoma; September 2022 PET scan - hypermetabolic solid pulmonary nodule of the left lower lobe, favor primary lung malignancy; numerous hypermetabolic nodules left lower lobe pleura; subcarinal/left hilar lymph nodes-positive for metastatic disease.  No evidence of abdomen pelvis metastases or bone metastasis.  NGS- PDL-1; NO targets  #Proceed with chemotherapy-carbo Alimta' add Keytruda with cycle #2 today. Labs today reviewed;  acceptable for treatment today.   I discussed the mechanism of action; Discussed the potential side effects of immunotherapy including but not limited to diarrhea; skin rash; elevated LFTs/endocrine abnormalities etc. however risk of serious life-threatening side effects is less than 10%.  # MRI brain-punctate lesion-asymptomatic [D/w- Dr.Vaslow]; will repeat short-term imaging in 2 months [SRS protocol] or so. Will order at next visit.   # Left chest wall pain:-improved; using hydrocodone  #COPD: Recommend continued inhalers.  #Diabetes-A1c 6.9 [AUG 2022]-preprandial blood sugars mostly 120s 130s.;  Recommend postprandial blood sugars-if elevated would recommend oral hypoglycemic drugs.  # Mediport placement-stable  # Prognosis: I reviewed the NGS with the patient and son in detail.  Discussed that no targetable mutations noted.  Understands that radiation could be offered based upon the brain MRI follow-up.  No role for any radiation to the lungs at this time.  And also no role for surgery.Discussed that the median survival is about 16 to 18 months. However, patient understands that these numbers do not belong to her specific situation.  Understands treatments are palliative and not curative.  #DISPOSITION: # Chemo today # 3 week- MD- labs- cbc/cmp; Carbo-Alimta-keytruda; CT chest prior-  Dr.B

## 2020-12-31 NOTE — Progress Notes (Signed)
Patient has been keeping a blood sugar log. She reports a dry cough, disturbed sleep pattern and after her last chemo she had mouth sores and diarrhea.

## 2020-12-31 NOTE — Progress Notes (Signed)
Nutrition Assessment   Reason for Assessment:  Diet education   ASSESSMENT:  65 year old female with lung cancer, stage IV.  Past medical history of GERD, hep C, HLD, DM, HTN, thyroid cancer.  Patient receiving carbo/alimta, [name] on hold due to insurance.  Met with patient and son in clinic.  Patient reports better appetite after improvement in mouth sores.  Reports that typically drinks coffee with sweeten creamer.  Then eats grits for breakfast, sandwich for lunch and dinner is usually meat, potato/rice and vegetable.     Medications: Vit D, decadron before chemo, nystatin, zofran, compazine, prilosec   Labs: glucose 159   Anthropometrics:   Height: 65 inches Weight: 189 lb today 195 lb 9.6 oz on 10/26 BMI: 31 3% weight loss in the last month  Estimated Energy Needs  Kcals: 2150-2580 Protein: 108-129 g Fluid: 2.1 L   NUTRITION DIAGNOSIS: Food and beverage related knowledge deficit related to new monitoring of blood glucose as evidenced by consult for education.    INTERVENTION:  RD concerned about placing increased diet restrictions on patient going through cancer treatment as patient with issues already with mouth sores, weight loss.   Educated patient on foods that contain carbohydrate, reading food label, sugar sweeten beverages.  Written material from AND provided to patient.  Encouraged protein food at every meal.   Encouraged patient to continue monitoring blood glucose.  Discussed that dexamethasone will increase blood glucose.  No longer taking predinsone.   Contact information provided   MONITORING, EVALUATION, GOAL: weight trends, intake, glucose   Next Visit: to be determined with next treatment  [name] B. Zenia Resides, [place], [place] Registered Dietitian [phone] (mobile)

## 2020-12-31 NOTE — Patient Instructions (Signed)
[facility]  Discharge Instructions: Thank you for choosing [facility] to provide your oncology and hematology care.  If you have a lab appointment with the [facility], please go directly to the [facility] and check in at the registration area.  Wear comfortable clothing and clothing appropriate for easy access to any Portacath or PICC line.   We strive to give you quality time with your provider. You may need to reschedule your appointment if you arrive late (15 or more minutes).  Arriving late affects you and other patients whose appointments are after yours.  Also, if you miss three or more appointments without notifying the office, you may be dismissed from the clinic at the provider's discretion.      For prescription refill requests, have your pharmacy contact our office and allow 72 hours for refills to be completed.    Today you received the following chemotherapy and/or immunotherapy agents: Keytruda, Alimta, Carboplatin      To help prevent nausea and vomiting after your treatment, we encourage you to take your nausea medication as directed.  BELOW ARE SYMPTOMS THAT SHOULD BE REPORTED IMMEDIATELY: *FEVER GREATER THAN 100.4 F (38 C) OR HIGHER *CHILLS OR SWEATING *NAUSEA AND VOMITING THAT IS NOT CONTROLLED WITH YOUR NAUSEA MEDICATION *UNUSUAL SHORTNESS OF BREATH *UNUSUAL BRUISING OR BLEEDING *URINARY PROBLEMS (pain or burning when urinating, or frequent urination) *BOWEL PROBLEMS (unusual diarrhea, constipation, pain near the anus) TENDERNESS IN MOUTH AND THROAT WITH OR WITHOUT PRESENCE OF ULCERS (sore throat, sores in mouth, or a toothache) UNUSUAL RASH, SWELLING OR PAIN  UNUSUAL VAGINAL DISCHARGE OR ITCHING   Items with * indicate a potential emergency and should be followed up as soon as possible or go to the Emergency Department if any problems should occur.  Please show the CHEMOTHERAPY ALERT CARD or IMMUNOTHERAPY  ALERT CARD at check-in to the Emergency Department and triage nurse.  Should you have questions after your visit or need to cancel or reschedule your appointment, please contact [facility]  [phone] and follow the prompts.  Office hours are 8:00 a.m. to 4:30 p.m. Monday - Friday. Please note that voicemails left after 4:00 p.m. may not be returned until the following business day.  We are closed weekends and major holidays. You have access to a nurse at all times for urgent questions. Please call the main number to the clinic [phone] and follow the prompts.  For any non-urgent questions, you may also contact your provider using MyChart. We now offer e-Visits for anyone 6 and older to request care online for non-urgent symptoms. For details visit [email or web address].   Also download the MyChart app! Go to the app store, search "MyChart", open the app, select [facility], and log in with your MyChart username and password.  Due to Covid, a mask is required upon entering the hospital/clinic. If you do not have a mask, one will be given to you upon arrival. For doctor visits, patients may have 1 support person aged 41 or older with them. For treatment visits, patients cannot have anyone with them due to current Covid guidelines and our immunocompromised population. Pemetrexed injection What is this medication? PEMETREXED (PEM e TREX ed) is a chemotherapy drug used to treat lung cancers like non-small cell lung cancer and mesothelioma. It may also be used to treat other cancers. This medicine may be used for other purposes; ask your health care provider or pharmacist if you have questions.  COMMON BRAND NAME(S): Alimta, PEMFEXY What should I tell my care team before I take this medication? They need to know if you have any of these conditions: infection (especially a virus infection such as chickenpox, cold sores, or herpes) kidney disease low blood  counts, like low white cell, platelet, or red cell counts lung or breathing disease, like asthma radiation therapy an unusual or allergic reaction to pemetrexed, other medicines, foods, dyes, or preservative pregnant or trying to get pregnant breast-feeding How should I use this medication? This drug is given as an infusion into a vein. It is administered in a hospital or clinic by a specially trained health care professional. Talk to your pediatrician regarding the use of this medicine in children. Special care may be needed. Overdosage: If you think you have taken too much of this medicine contact a poison control center or emergency room at once. NOTE: This medicine is only for you. Do not share this medicine with others. What if I miss a dose? It is important not to miss your dose. Call your doctor or health care professional if you are unable to keep an appointment. What may interact with this medication? This medicine may interact with the following medications: Ibuprofen This list may not describe all possible interactions. Give your health care provider a list of all the medicines, herbs, non-prescription drugs, or dietary supplements you use. Also tell them if you smoke, drink alcohol, or use illegal drugs. Some items may interact with your medicine. What should I watch for while using this medication? Visit your doctor for checks on your progress. This drug may make you feel generally unwell. This is not uncommon, as chemotherapy can affect healthy cells as well as cancer cells. Report any side effects. Continue your course of treatment even though you feel ill unless your doctor tells you to stop. In some cases, you may be given additional medicines to help with side effects. Follow all directions for their use. Call your doctor or health care professional for advice if you get a fever, chills or sore throat, or other symptoms of a cold or flu. Do not treat yourself. This drug  decreases your body's ability to fight infections. Try to avoid being around people who are sick. This medicine may increase your risk to bruise or bleed. Call your doctor or health care professional if you notice any unusual bleeding. Be careful brushing and flossing your teeth or using a toothpick because you may get an infection or bleed more easily. If you have any dental work done, tell your dentist you are receiving this medicine. Avoid taking products that contain aspirin, acetaminophen, ibuprofen, naproxen, or ketoprofen unless instructed by your doctor. These medicines may hide a fever. Call your doctor or health care professional if you get diarrhea or mouth sores. Do not treat yourself. To protect your kidneys, drink water or other fluids as directed while you are taking this medicine. Do not become pregnant while taking this medicine or for 6 months after stopping it. Women should inform their doctor if they wish to become pregnant or think they might be pregnant. Men should not father a child while taking this medicine and for 3 months after stopping it. This may interfere with the ability to father a child. You should talk to your doctor or health care professional if you are concerned about your fertility. There is a potential for serious side effects to an unborn child. Talk to your health care professional or pharmacist for  more information. Do not breast-feed an infant while taking this medicine or for 1 week after stopping it. What side effects may I notice from receiving this medication? Side effects that you should report to your doctor or health care professional as soon as possible: allergic reactions like skin rash, itching or hives, swelling of the face, lips, or tongue breathing problems redness, blistering, peeling or loosening of the skin, including inside the mouth signs and symptoms of bleeding such as bloody or black, tarry stools; red or dark-brown urine; spitting up blood  or brown material that looks like coffee grounds; red spots on the skin; unusual bruising or bleeding from the eye, gums, or nose signs and symptoms of infection like fever or chills; cough; sore throat; pain or trouble passing urine signs and symptoms of kidney injury like trouble passing urine or change in the amount of urine signs and symptoms of liver injury like dark yellow or brown urine; general ill feeling or flu-like symptoms; light-colored stools; loss of appetite; nausea; right upper belly pain; unusually weak or tired; yellowing of the eyes or skin Side effects that usually do not require medical attention (report to your doctor or health care professional if they continue or are bothersome): constipation mouth sores nausea, vomiting unusually weak or tired This list may not describe all possible side effects. Call your doctor for medical advice about side effects. You may report side effects to FDA at 1-800-FDA-1088. Where should I keep my medication? This drug is given in a hospital or clinic and will not be stored at home. NOTE: This sheet is a summary. It may not cover all possible information. If you have questions about this medicine, talk to your doctor, pharmacist, or health care provider.  2022 Elsevier/Gold Standard (2017-03-29 00:00:00) Carboplatin injection What is this medication? CARBOPLATIN (KAR boe pla tin) is a chemotherapy drug. It targets fast dividing cells, like cancer cells, and causes these cells to die. This medicine is used to treat ovarian cancer and many other cancers. This medicine may be used for other purposes; ask your health care provider or pharmacist if you have questions. COMMON BRAND NAME(S): Paraplatin What should I tell my care team before I take this medication? They need to know if you have any of these conditions: blood disorders hearing problems kidney disease recent or ongoing radiation therapy an unusual or allergic reaction to  carboplatin, cisplatin, other chemotherapy, other medicines, foods, dyes, or preservatives pregnant or trying to get pregnant breast-feeding How should I use this medication? This drug is usually given as an infusion into a vein. It is administered in a hospital or clinic by a specially trained health care professional. Talk to your pediatrician regarding the use of this medicine in children. Special care may be needed. Overdosage: If you think you have taken too much of this medicine contact a poison control center or emergency room at once. NOTE: This medicine is only for you. Do not share this medicine with others. What if I miss a dose? It is important not to miss a dose. Call your doctor or health care professional if you are unable to keep an appointment. What may interact with this medication? medicines for seizures medicines to increase blood counts like filgrastim, pegfilgrastim, sargramostim some antibiotics like amikacin, gentamicin, neomycin, streptomycin, tobramycin vaccines Talk to your doctor or health care professional before taking any of these medicines: acetaminophen aspirin ibuprofen ketoprofen naproxen This list may not describe all possible interactions. Give your health care provider  a list of all the medicines, herbs, non-prescription drugs, or dietary supplements you use. Also tell them if you smoke, drink alcohol, or use illegal drugs. Some items may interact with your medicine. What should I watch for while using this medication? Your condition will be monitored carefully while you are receiving this medicine. You will need important blood work done while you are taking this medicine. This drug may make you feel generally unwell. This is not uncommon, as chemotherapy can affect healthy cells as well as cancer cells. Report any side effects. Continue your course of treatment even though you feel ill unless your doctor tells you to stop. In some cases, you may be  given additional medicines to help with side effects. Follow all directions for their use. Call your doctor or health care professional for advice if you get a fever, chills or sore throat, or other symptoms of a cold or flu. Do not treat yourself. This drug decreases your body's ability to fight infections. Try to avoid being around people who are sick. This medicine may increase your risk to bruise or bleed. Call your doctor or health care professional if you notice any unusual bleeding. Be careful brushing and flossing your teeth or using a toothpick because you may get an infection or bleed more easily. If you have any dental work done, tell your dentist you are receiving this medicine. Avoid taking products that contain aspirin, acetaminophen, ibuprofen, naproxen, or ketoprofen unless instructed by your doctor. These medicines may hide a fever. Do not become pregnant while taking this medicine. Women should inform their doctor if they wish to become pregnant or think they might be pregnant. There is a potential for serious side effects to an unborn child. Talk to your health care professional or pharmacist for more information. Do not breast-feed an infant while taking this medicine. What side effects may I notice from receiving this medication? Side effects that you should report to your doctor or health care professional as soon as possible: allergic reactions like skin rash, itching or hives, swelling of the face, lips, or tongue signs of infection - fever or chills, cough, sore throat, pain or difficulty passing urine signs of decreased platelets or bleeding - bruising, pinpoint red spots on the skin, black, tarry stools, nosebleeds signs of decreased red blood cells - unusually weak or tired, fainting spells, lightheadedness breathing problems changes in hearing changes in vision chest pain high blood pressure low blood counts - This drug may decrease the number of white blood cells, red  blood cells and platelets. You may be at increased risk for infections and bleeding. nausea and vomiting pain, swelling, redness or irritation at the injection site pain, tingling, numbness in the hands or feet problems with balance, talking, walking trouble passing urine or change in the amount of urine Side effects that usually do not require medical attention (report to your doctor or health care professional if they continue or are bothersome): hair loss loss of appetite metallic taste in the mouth or changes in taste This list may not describe all possible side effects. Call your doctor for medical advice about side effects. You may report side effects to FDA at 1-800-FDA-1088. Where should I keep my medication? This drug is given in a hospital or clinic and will not be stored at home. NOTE: This sheet is a summary. It may not cover all possible information. If you have questions about this medicine, talk to your doctor, pharmacist, or health care provider.  2022 Elsevier/Gold Standard (2007-07-12 00:00:00) Pembrolizumab injection What is this medication? PEMBROLIZUMAB (pem broe liz ue mab) is a monoclonal antibody. It is used to treat certain types of cancer. This medicine may be used for other purposes; ask your health care provider or pharmacist if you have questions. COMMON BRAND NAME(S): Keytruda What should I tell my care team before I take this medication? They need to know if you have any of these conditions: autoimmune diseases like Crohn's disease, ulcerative colitis, or lupus have had or planning to have an allogeneic stem cell transplant (uses someone else's stem cells) history of organ transplant history of chest radiation nervous system problems like myasthenia gravis or Guillain-Barre syndrome an unusual or allergic reaction to pembrolizumab, other medicines, foods, dyes, or preservatives pregnant or trying to get pregnant breast-feeding How should I use this  medication? This medicine is for infusion into a vein. It is given by a health care professional in a hospital or clinic setting. A special MedGuide will be given to you before each treatment. Be sure to read this information carefully each time. Talk to your pediatrician regarding the use of this medicine in children. While this drug may be prescribed for children as young as 6 months for selected conditions, precautions do apply. Overdosage: If you think you have taken too much of this medicine contact a poison control center or emergency room at once. NOTE: This medicine is only for you. Do not share this medicine with others. What if I miss a dose? It is important not to miss your dose. Call your doctor or health care professional if you are unable to keep an appointment. What may interact with this medication? Interactions have not been studied. This list may not describe all possible interactions. Give your health care provider a list of all the medicines, herbs, non-prescription drugs, or dietary supplements you use. Also tell them if you smoke, drink alcohol, or use illegal drugs. Some items may interact with your medicine. What should I watch for while using this medication? Your condition will be monitored carefully while you are receiving this medicine. You may need blood work done while you are taking this medicine. Do not become pregnant while taking this medicine or for 4 months after stopping it. Women should inform their doctor if they wish to become pregnant or think they might be pregnant. There is a potential for serious side effects to an unborn child. Talk to your health care professional or pharmacist for more information. Do not breast-feed an infant while taking this medicine or for 4 months after the last dose. What side effects may I notice from receiving this medication? Side effects that you should report to your doctor or health care professional as soon as  possible: allergic reactions like skin rash, itching or hives, swelling of the face, lips, or tongue bloody or black, tarry breathing problems changes in vision chest pain chills confusion constipation cough diarrhea dizziness or feeling faint or lightheaded fast or irregular heartbeat fever flushing joint pain low blood counts - this medicine may decrease the number of white blood cells, red blood cells and platelets. You may be at increased risk for infections and bleeding. muscle pain muscle weakness pain, tingling, numbness in the hands or feet persistent headache redness, blistering, peeling or loosening of the skin, including inside the mouth signs and symptoms of high blood sugar such as dizziness; dry mouth; dry skin; fruity breath; nausea; stomach pain; increased hunger or thirst; increased urination signs and  symptoms of kidney injury like trouble passing urine or change in the amount of urine signs and symptoms of liver injury like dark urine, light-colored stools, loss of appetite, nausea, right upper belly pain, yellowing of the eyes or skin sweating swollen lymph nodes weight loss Side effects that usually do not require medical attention (report to your doctor or health care professional if they continue or are bothersome): decreased appetite hair loss tiredness This list may not describe all possible side effects. Call your doctor for medical advice about side effects. You may report side effects to FDA at 1-800-FDA-1088. Where should I keep my medication? This drug is given in a hospital or clinic and will not be stored at home. NOTE: This sheet is a summary. It may not cover all possible information. If you have questions about this medicine, talk to your doctor, pharmacist, or health care provider.  2022 Elsevier/Gold Standard (2020-10-21 00:00:00)

## 2021-01-12 ENCOUNTER — Other Ambulatory Visit: Payer: Self-pay | Admitting: *Deleted

## 2021-01-12 MED ORDER — HYDROCODONE-ACETAMINOPHEN 5-325 MG PO TABS: 1.0000 | ORAL_TABLET | Freq: Three times a day (TID) | ORAL | 0 refills | Status: DC | PRN

## 2021-01-16 ENCOUNTER — Encounter: Payer: Self-pay | Admitting: Internal Medicine

## 2021-01-16 ENCOUNTER — Other Ambulatory Visit: Payer: Self-pay

## 2021-01-16 ENCOUNTER — Ambulatory Visit
Admission: RE | Admit: 2021-01-16 | Discharge: 2021-01-16 | Disposition: A | Discharge status: Home / Self Care | Payer: 59 | Source: Ambulatory Visit | Attending: Internal Medicine | Admitting: Internal Medicine

## 2021-01-16 DIAGNOSIS — C3432 Malignant neoplasm of lower lobe, left bronchus or lung: Secondary | ICD-10-CM | POA: Diagnosis not present

## 2021-01-16 MED ORDER — IOHEXOL 300 MG/ML  SOLN
75.0000 mL | Freq: Once | INTRAMUSCULAR | Status: AC | PRN
  Administered 2021-01-16: 75 mL via INTRAVENOUS

## 2021-01-21 ENCOUNTER — Inpatient Hospital Stay: Payer: 59 | Attending: Internal Medicine

## 2021-01-21 ENCOUNTER — Encounter: Payer: Self-pay | Admitting: Internal Medicine

## 2021-01-21 ENCOUNTER — Inpatient Hospital Stay (HOSPITAL_BASED_OUTPATIENT_CLINIC_OR_DEPARTMENT_OTHER): Payer: 59 | Admitting: Internal Medicine

## 2021-01-21 ENCOUNTER — Other Ambulatory Visit: Payer: Self-pay

## 2021-01-21 ENCOUNTER — Telehealth: Payer: Self-pay | Admitting: *Deleted

## 2021-01-21 ENCOUNTER — Encounter: Payer: Self-pay | Admitting: *Deleted

## 2021-01-21 ENCOUNTER — Inpatient Hospital Stay: Payer: 59

## 2021-01-21 VITALS — BP 139/79 | HR 77 | Temp 98.2°F | Resp 16 | Wt 191.1 lb

## 2021-01-21 DIAGNOSIS — E119 Type 2 diabetes mellitus without complications: Secondary | ICD-10-CM | POA: Insufficient documentation

## 2021-01-21 DIAGNOSIS — F17210 Nicotine dependence, cigarettes, uncomplicated: Secondary | ICD-10-CM | POA: Diagnosis not present

## 2021-01-21 DIAGNOSIS — C7931 Secondary malignant neoplasm of brain: Secondary | ICD-10-CM | POA: Diagnosis not present

## 2021-01-21 DIAGNOSIS — J449 Chronic obstructive pulmonary disease, unspecified: Secondary | ICD-10-CM | POA: Diagnosis not present

## 2021-01-21 DIAGNOSIS — Z5111 Encounter for antineoplastic chemotherapy: Secondary | ICD-10-CM | POA: Insufficient documentation

## 2021-01-21 DIAGNOSIS — K219 Gastro-esophageal reflux disease without esophagitis: Secondary | ICD-10-CM | POA: Insufficient documentation

## 2021-01-21 DIAGNOSIS — C3432 Malignant neoplasm of lower lobe, left bronchus or lung: Secondary | ICD-10-CM

## 2021-01-21 DIAGNOSIS — Z79899 Other long term (current) drug therapy: Secondary | ICD-10-CM | POA: Diagnosis not present

## 2021-01-21 DIAGNOSIS — E785 Hyperlipidemia, unspecified: Secondary | ICD-10-CM | POA: Diagnosis not present

## 2021-01-21 DIAGNOSIS — Z85850 Personal history of malignant neoplasm of thyroid: Secondary | ICD-10-CM | POA: Diagnosis not present

## 2021-01-21 DIAGNOSIS — K760 Fatty (change of) liver, not elsewhere classified: Secondary | ICD-10-CM | POA: Insufficient documentation

## 2021-01-21 DIAGNOSIS — I10 Essential (primary) hypertension: Secondary | ICD-10-CM | POA: Diagnosis not present

## 2021-01-21 LAB — CBC WITH DIFFERENTIAL/PLATELET
Abs Immature Granulocytes: 0.22 10*3/uL — ABNORMAL HIGH (ref 0.00–0.07)
Basophils Absolute: 0 10*3/uL (ref 0.0–0.1)
Basophils Relative: 0 %
Eosinophils Absolute: 0 10*3/uL (ref 0.0–0.5)
Eosinophils Relative: 0 %
HCT: 34.4 % — ABNORMAL LOW (ref 36.0–46.0)
Hemoglobin: 10.9 g/dL — ABNORMAL LOW (ref 12.0–15.0)
Immature Granulocytes: 2 %
Lymphocytes Relative: 9 %
Lymphs Abs: 1.1 10*3/uL (ref 0.7–4.0)
MCH: 30 pg (ref 26.0–34.0)
MCHC: 31.7 g/dL (ref 30.0–36.0)
MCV: 94.8 fL (ref 80.0–100.0)
Monocytes Absolute: 1.1 10*3/uL — ABNORMAL HIGH (ref 0.1–1.0)
Monocytes Relative: 8 %
Neutro Abs: 10.7 10*3/uL — ABNORMAL HIGH (ref 1.7–7.7)
Neutrophils Relative %: 81 %
Platelets: 250 10*3/uL (ref 150–400)
RBC: 3.63 MIL/uL — ABNORMAL LOW (ref 3.87–5.11)
RDW: 18.9 % — ABNORMAL HIGH (ref 11.5–15.5)
WBC: 13.1 10*3/uL — ABNORMAL HIGH (ref 4.0–10.5)
nRBC: 0.2 % (ref 0.0–0.2)

## 2021-01-21 LAB — COMPREHENSIVE METABOLIC PANEL
ALT: 27 U/L (ref 0–44)
AST: 19 U/L (ref 15–41)
Albumin: 3.6 g/dL (ref 3.5–5.0)
Alkaline Phosphatase: 65 U/L (ref 38–126)
Anion gap: 11 (ref 5–15)
BUN: 13 mg/dL (ref 8–23)
CO2: 24 mmol/L (ref 22–32)
Calcium: 8.2 mg/dL — ABNORMAL LOW (ref 8.9–10.3)
Chloride: 107 mmol/L (ref 98–111)
Creatinine, Ser: 0.63 mg/dL (ref 0.44–1.00)
GFR, Estimated: >60 mL/min (ref >60)
Glucose, Bld: 141 mg/dL — ABNORMAL HIGH (ref 70–99)
Potassium: 3.8 mmol/L (ref 3.5–5.1)
Sodium: 142 mmol/L (ref 135–145)
Total Bilirubin: 0.3 mg/dL (ref 0.3–1.2)
Total Protein: 6.7 g/dL (ref 6.5–8.1)

## 2021-01-21 MED ORDER — SODIUM CHLORIDE 0.9 % IV SOLN
10.0000 mg | Freq: Once | INTRAVENOUS | Status: AC
  Administered 2021-01-21: 10 mg via INTRAVENOUS
  Filled 2021-01-21: qty 10

## 2021-01-21 MED ORDER — SODIUM CHLORIDE 0.9 % IV SOLN
150.0000 mg | Freq: Once | INTRAVENOUS | Status: AC
  Administered 2021-01-21: 150 mg via INTRAVENOUS
  Filled 2021-01-21: qty 150

## 2021-01-21 MED ORDER — HEPARIN SOD (PORK) LOCK FLUSH 100 UNIT/ML IV SOLN
500.0000 [IU] | Freq: Once | INTRAVENOUS | Status: AC
  Administered 2021-01-21: 500 [IU] via INTRAVENOUS
  Filled 2021-01-21: qty 5

## 2021-01-21 MED ORDER — SODIUM CHLORIDE 0.9 % IV SOLN
628.0000 mg | Freq: Once | INTRAVENOUS | Status: AC
  Administered 2021-01-21: 630 mg via INTRAVENOUS
  Filled 2021-01-21: qty 63

## 2021-01-21 MED ORDER — SODIUM CHLORIDE 0.9 % IV SOLN
200.0000 mg | Freq: Once | INTRAVENOUS | Status: AC
  Administered 2021-01-21: 200 mg via INTRAVENOUS
  Filled 2021-01-21: qty 8

## 2021-01-21 MED ORDER — SODIUM CHLORIDE 0.9 % IV SOLN
Freq: Once | INTRAVENOUS | Status: AC
  Filled 2021-01-21: qty 250

## 2021-01-21 MED ORDER — SODIUM CHLORIDE 0.9 % IV SOLN
500.0000 mg/m2 | Freq: Once | INTRAVENOUS | Status: AC
  Administered 2021-01-21: 1000 mg via INTRAVENOUS
  Filled 2021-01-21: qty 40

## 2021-01-21 MED ORDER — PALONOSETRON HCL INJECTION 0.25 MG/5ML
0.2500 mg | Freq: Once | INTRAVENOUS | Status: AC
  Administered 2021-01-21: 0.25 mg via INTRAVENOUS
  Filled 2021-01-21: qty 5

## 2021-01-21 MED ORDER — GLIPIZIDE 5 MG PO TABS: 5.0000 mg | ORAL_TABLET | Freq: Two times a day (BID) | ORAL | 1 refills | Status: DC

## 2021-01-21 MED ORDER — SODIUM CHLORIDE 0.9% FLUSH
10.0000 mL | INTRAVENOUS | Status: DC | PRN
  Administered 2021-01-21: 10 mL via INTRAVENOUS
  Filled 2021-01-21: qty 10

## 2021-01-21 MED ORDER — HEPARIN SOD (PORK) LOCK FLUSH 100 UNIT/ML IV SOLN
500.0000 [IU] | Freq: Once | INTRAVENOUS | Status: DC | PRN
  Filled 2021-01-21: qty 5

## 2021-01-21 MED ORDER — CYANOCOBALAMIN 1000 MCG/ML IJ SOLN
1000.0000 ug | Freq: Once | INTRAMUSCULAR | Status: AC
  Administered 2021-01-21: 1000 ug via INTRAMUSCULAR
  Filled 2021-01-21: qty 1

## 2021-01-21 NOTE — Telephone Encounter (Signed)
Per Dr. 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 Patient is requesting up to 8 weeks off during her chemotherapy.  Hayley,RN will let me know any updates on this patient.

## 2021-01-21 NOTE — Patient Instructions (Signed)
[facility]CER [facility] AT [place]  Discharge Instructions: Thank you for choosing [facility] to provide your oncology and hematology care.  If you have a lab appointment with the [facility], please go directly to the [facility] and check in at the registration area.  Wear comfortable clothing and clothing appropriate for easy access to any Portacath or PICC line.   We strive to give you quality time with your provider. You may need to reschedule your appointment if you arrive late (15 or more minutes).  Arriving late affects you and other patients whose appointments are after yours.  Also, if you miss three or more appointments without notifying the office, you may be dismissed from the clinic at the provider's discretion.      For prescription refill requests, have your pharmacy contact our office and allow 72 hours for refills to be completed.    Today you received the following chemotherapy and/or immunotherapy agents: Keytruda, Alimta, Carboplatin        To help prevent nausea and vomiting after your treatment, we encourage you to take your nausea medication as directed.  BELOW ARE SYMPTOMS THAT SHOULD BE REPORTED IMMEDIATELY: *FEVER GREATER THAN 100.4 F (38 C) OR HIGHER *CHILLS OR SWEATING *NAUSEA AND VOMITING THAT IS NOT CONTROLLED WITH YOUR NAUSEA MEDICATION *UNUSUAL SHORTNESS OF BREATH *UNUSUAL BRUISING OR BLEEDING *URINARY PROBLEMS (pain or burning when urinating, or frequent urination) *BOWEL PROBLEMS (unusual diarrhea, constipation, pain near the anus) TENDERNESS IN MOUTH AND THROAT WITH OR WITHOUT PRESENCE OF ULCERS (sore throat, sores in mouth, or a toothache) UNUSUAL RASH, SWELLING OR PAIN  UNUSUAL VAGINAL DISCHARGE OR ITCHING   Items with * indicate a potential emergency and should be followed up as soon as possible or go to the Emergency Department if any problems should occur.  Please show the CHEMOTHERAPY ALERT CARD or IMMUNOTHERAPY  ALERT CARD at check-in to the Emergency Department and triage nurse.  Should you have questions after your visit or need to cancel or reschedule your appointment, please contact [facility] CANCER [place] AT [place]  [phone] and follow the prompts.  Office hours are 8:00 a.m. to 4:30 p.m. Monday - Friday. Please note that voicemails left after 4:00 p.m. may not be returned until the following business day.  We are closed weekends and major holidays. You have access to a nurse at all times for urgent questions. Please call the main number to the clinic [phone] and follow the prompts.  For any non-urgent questions, you may also contact your provider using MyChart. We now offer e-Visits for anyone 76 and older to request care online for non-urgent symptoms. For details visit [email or web address].   Also download the MyChart app! Go to the app store, search "MyChart", open the app, select [facility], and log in with your MyChart username and password.  Due to Covid, a mask is required upon entering the hospital/clinic. If you do not have a mask, one will be given to you upon arrival. For doctor visits, patients may have 1 support person aged 26 or older with them. For treatment visits, patients cannot have anyone with them due to current Covid guidelines and our immunocompromised population. Carboplatin injection What is this medication? CARBOPLATIN (KAR boe pla tin) is a chemotherapy drug. It targets fast dividing cells, like cancer cells, and causes these cells to die. This medicine is used to treat ovarian cancer and many other cancers. This medicine may be used for other purposes; ask your health care  provider or pharmacist if you have questions. COMMON BRAND NAME(S): Paraplatin What should I tell my care team before I take this medication? They need to know if you have any of these conditions: blood disorders hearing problems kidney disease recent or ongoing radiation  therapy an unusual or allergic reaction to carboplatin, cisplatin, other chemotherapy, other medicines, foods, dyes, or preservatives pregnant or trying to get pregnant breast-feeding How should I use this medication? This drug is usually given as an infusion into a vein. It is administered in a hospital or clinic by a specially trained health care professional. Talk to your pediatrician regarding the use of this medicine in children. Special care may be needed. Overdosage: If you think you have taken too much of this medicine contact a poison control center or emergency room at once. NOTE: This medicine is only for you. Do not share this medicine with others. What if I miss a dose? It is important not to miss a dose. Call your doctor or health care professional if you are unable to keep an appointment. What may interact with this medication? medicines for seizures medicines to increase blood counts like filgrastim, pegfilgrastim, sargramostim some antibiotics like amikacin, gentamicin, neomycin, streptomycin, tobramycin vaccines Talk to your doctor or health care professional before taking any of these medicines: acetaminophen aspirin ibuprofen ketoprofen naproxen This list may not describe all possible interactions. Give your health care provider a list of all the medicines, herbs, non-prescription drugs, or dietary supplements you use. Also tell them if you smoke, drink alcohol, or use illegal drugs. Some items may interact with your medicine. What should I watch for while using this medication? Your condition will be monitored carefully while you are receiving this medicine. You will need important blood work done while you are taking this medicine. This drug may make you feel generally unwell. This is not uncommon, as chemotherapy can affect healthy cells as well as cancer cells. Report any side effects. Continue your course of treatment even though you feel ill unless your doctor tells  you to stop. In some cases, you may be given additional medicines to help with side effects. Follow all directions for their use. Call your doctor or health care professional for advice if you get a fever, chills or sore throat, or other symptoms of a cold or flu. Do not treat yourself. This drug decreases your body's ability to fight infections. Try to avoid being around people who are sick. This medicine may increase your risk to bruise or bleed. Call your doctor or health care professional if you notice any unusual bleeding. Be careful brushing and flossing your teeth or using a toothpick because you may get an infection or bleed more easily. If you have any dental work done, tell your dentist you are receiving this medicine. Avoid taking products that contain aspirin, acetaminophen, ibuprofen, naproxen, or ketoprofen unless instructed by your doctor. These medicines may hide a fever. Do not become pregnant while taking this medicine. Women should inform their doctor if they wish to become pregnant or think they might be pregnant. There is a potential for serious side effects to an unborn child. Talk to your health care professional or pharmacist for more information. Do not breast-feed an infant while taking this medicine. What side effects may I notice from receiving this medication? Side effects that you should report to your doctor or health care professional as soon as possible: allergic reactions like skin rash, itching or hives, swelling of the face, lips,  or tongue signs of infection - fever or chills, cough, sore throat, pain or difficulty passing urine signs of decreased platelets or bleeding - bruising, pinpoint red spots on the skin, black, tarry stools, nosebleeds signs of decreased red blood cells - unusually weak or tired, fainting spells, lightheadedness breathing problems changes in hearing changes in vision chest pain high blood pressure low blood counts - This drug may  decrease the number of white blood cells, red blood cells and platelets. You may be at increased risk for infections and bleeding. nausea and vomiting pain, swelling, redness or irritation at the injection site pain, tingling, numbness in the hands or feet problems with balance, talking, walking trouble passing urine or change in the amount of urine Side effects that usually do not require medical attention (report to your doctor or health care professional if they continue or are bothersome): hair loss loss of appetite metallic taste in the mouth or changes in taste This list may not describe all possible side effects. Call your doctor for medical advice about side effects. You may report side effects to FDA at 1-800-FDA-1088. Where should I keep my medication? This drug is given in a hospital or clinic and will not be stored at home. NOTE: This sheet is a summary. It may not cover all possible information. If you have questions about this medicine, talk to your doctor, pharmacist, or health care provider.  2022 Elsevier/Gold Standard (2007-07-12 00:00:00) Pemetrexed injection What is this medication? PEMETREXED (PEM e TREX ed) is a chemotherapy drug used to treat lung cancers like non-small cell lung cancer and mesothelioma. It may also be used to treat other cancers. This medicine may be used for other purposes; ask your health care provider or pharmacist if you have questions. COMMON BRAND NAME(S): Alimta, PEMFEXY What should I tell my care team before I take this medication? They need to know if you have any of these conditions: infection (especially a virus infection such as chickenpox, cold sores, or herpes) kidney disease low blood counts, like low white cell, platelet, or red cell counts lung or breathing disease, like asthma radiation therapy an unusual or allergic reaction to pemetrexed, other medicines, foods, dyes, or preservative pregnant or trying to get  pregnant breast-feeding How should I use this medication? This drug is given as an infusion into a vein. It is administered in a hospital or clinic by a specially trained health care professional. Talk to your pediatrician regarding the use of this medicine in children. Special care may be needed. Overdosage: If you think you have taken too much of this medicine contact a poison control center or emergency room at once. NOTE: This medicine is only for you. Do not share this medicine with others. What if I miss a dose? It is important not to miss your dose. Call your doctor or health care professional if you are unable to keep an appointment. What may interact with this medication? This medicine may interact with the following medications: Ibuprofen This list may not describe all possible interactions. Give your health care provider a list of all the medicines, herbs, non-prescription drugs, or dietary supplements you use. Also tell them if you smoke, drink alcohol, or use illegal drugs. Some items may interact with your medicine. What should I watch for while using this medication? Visit your doctor for checks on your progress. This drug may make you feel generally unwell. This is not uncommon, as chemotherapy can affect healthy cells as well as cancer cells.  Report any side effects. Continue your course of treatment even though you feel ill unless your doctor tells you to stop. In some cases, you may be given additional medicines to help with side effects. Follow all directions for their use. Call your doctor or health care professional for advice if you get a fever, chills or sore throat, or other symptoms of a cold or flu. Do not treat yourself. This drug decreases your body's ability to fight infections. Try to avoid being around people who are sick. This medicine may increase your risk to bruise or bleed. Call your doctor or health care professional if you notice any unusual bleeding. Be  careful brushing and flossing your teeth or using a toothpick because you may get an infection or bleed more easily. If you have any dental work done, tell your dentist you are receiving this medicine. Avoid taking products that contain aspirin, acetaminophen, ibuprofen, naproxen, or ketoprofen unless instructed by your doctor. These medicines may hide a fever. Call your doctor or health care professional if you get diarrhea or mouth sores. Do not treat yourself. To protect your kidneys, drink water or other fluids as directed while you are taking this medicine. Do not become pregnant while taking this medicine or for 6 months after stopping it. Women should inform their doctor if they wish to become pregnant or think they might be pregnant. Men should not father a child while taking this medicine and for 3 months after stopping it. This may interfere with the ability to father a child. You should talk to your doctor or health care professional if you are concerned about your fertility. There is a potential for serious side effects to an unborn child. Talk to your health care professional or pharmacist for more information. Do not breast-feed an infant while taking this medicine or for 1 week after stopping it. What side effects may I notice from receiving this medication? Side effects that you should report to your doctor or health care professional as soon as possible: allergic reactions like skin rash, itching or hives, swelling of the face, lips, or tongue breathing problems redness, blistering, peeling or loosening of the skin, including inside the mouth signs and symptoms of bleeding such as bloody or black, tarry stools; red or dark-brown urine; spitting up blood or brown material that looks like coffee grounds; red spots on the skin; unusual bruising or bleeding from the eye, gums, or nose signs and symptoms of infection like fever or chills; cough; sore throat; pain or trouble passing  urine signs and symptoms of kidney injury like trouble passing urine or change in the amount of urine signs and symptoms of liver injury like dark yellow or brown urine; general ill feeling or flu-like symptoms; light-colored stools; loss of appetite; nausea; right upper belly pain; unusually weak or tired; yellowing of the eyes or skin Side effects that usually do not require medical attention (report to your doctor or health care professional if they continue or are bothersome): constipation mouth sores nausea, vomiting unusually weak or tired This list may not describe all possible side effects. Call your doctor for medical advice about side effects. You may report side effects to FDA at 1-800-FDA-1088. Where should I keep my medication? This drug is given in a hospital or clinic and will not be stored at home. NOTE: This sheet is a summary. It may not cover all possible information. If you have questions about this medicine, talk to your doctor, pharmacist, or health  care provider.  2022 Elsevier/Gold Standard (2017-03-29 00:00:00) Pembrolizumab injection What is this medication? PEMBROLIZUMAB (pem broe liz ue mab) is a monoclonal antibody. It is used to treat certain types of cancer. This medicine may be used for other purposes; ask your health care provider or pharmacist if you have questions. COMMON BRAND NAME(S): Keytruda What should I tell my care team before I take this medication? They need to know if you have any of these conditions: autoimmune diseases like Crohn's disease, ulcerative colitis, or lupus have had or planning to have an allogeneic stem cell transplant (uses someone else's stem cells) history of organ transplant history of chest radiation nervous system problems like myasthenia gravis or Guillain-Barre syndrome an unusual or allergic reaction to pembrolizumab, other medicines, foods, dyes, or preservatives pregnant or trying to get pregnant breast-feeding How  should I use this medication? This medicine is for infusion into a vein. It is given by a health care professional in a hospital or clinic setting. A special MedGuide will be given to you before each treatment. Be sure to read this information carefully each time. Talk to your pediatrician regarding the use of this medicine in children. While this drug may be prescribed for children as young as 6 months for selected conditions, precautions do apply. Overdosage: If you think you have taken too much of this medicine contact a poison control center or emergency room at once. NOTE: This medicine is only for you. Do not share this medicine with others. What if I miss a dose? It is important not to miss your dose. Call your doctor or health care professional if you are unable to keep an appointment. What may interact with this medication? Interactions have not been studied. This list may not describe all possible interactions. Give your health care provider a list of all the medicines, herbs, non-prescription drugs, or dietary supplements you use. Also tell them if you smoke, drink alcohol, or use illegal drugs. Some items may interact with your medicine. What should I watch for while using this medication? Your condition will be monitored carefully while you are receiving this medicine. You may need blood work done while you are taking this medicine. Do not become pregnant while taking this medicine or for 4 months after stopping it. Women should inform their doctor if they wish to become pregnant or think they might be pregnant. There is a potential for serious side effects to an unborn child. Talk to your health care professional or pharmacist for more information. Do not breast-feed an infant while taking this medicine or for 4 months after the last dose. What side effects may I notice from receiving this medication? Side effects that you should report to your doctor or health care professional as soon  as possible: allergic reactions like skin rash, itching or hives, swelling of the face, lips, or tongue bloody or black, tarry breathing problems changes in vision chest pain chills confusion constipation cough diarrhea dizziness or feeling faint or lightheaded fast or irregular heartbeat fever flushing joint pain low blood counts - this medicine may decrease the number of white blood cells, red blood cells and platelets. You may be at increased risk for infections and bleeding. muscle pain muscle weakness pain, tingling, numbness in the hands or feet persistent headache redness, blistering, peeling or loosening of the skin, including inside the mouth signs and symptoms of high blood sugar such as dizziness; dry mouth; dry skin; fruity breath; nausea; stomach pain; increased hunger or thirst; increased  urination signs and symptoms of kidney injury like trouble passing urine or change in the amount of urine signs and symptoms of liver injury like dark urine, light-colored stools, loss of appetite, nausea, right upper belly pain, yellowing of the eyes or skin sweating swollen lymph nodes weight loss Side effects that usually do not require medical attention (report to your doctor or health care professional if they continue or are bothersome): decreased appetite hair loss tiredness This list may not describe all possible side effects. Call your doctor for medical advice about side effects. You may report side effects to FDA at 1-800-FDA-1088. Where should I keep my medication? This drug is given in a hospital or clinic and will not be stored at home. NOTE: This sheet is a summary. It may not cover all possible information. If you have questions about this medicine, talk to your doctor, pharmacist, or health care provider.  2022 Elsevier/Gold Standard (2020-10-21 00:00:00)

## 2021-01-21 NOTE — Assessment & Plan Note (Addendum)
#  Stage IV -non-small cell favor adenocarcinoma; on carbo-alita-keytruda #2- DEC 2nd, 2022- September 2022 CT scan- Similar appearance of metastatic bronchogenic neoplasm with signs of pleural involvement; stable bilateral multifocal groundglass opacities; New periosteal reaction about the LEFT lateral ninth rib-likely sclerosis from ongoing therapy.  #Proceed with carbo Alimta Keytruda No. 3. Labs today reviewed;  acceptable for treatment today.   # MRI brain-punctate lesion-asymptomatic [D/w- Dr.Vaslow]; will repeat short-term imaging in 2 months [SRS protocol] or so. Will order MRI today.   # Left chest wall pain:-improved; using hydrocodone prn.   #COPD [Dr.A]: Worse again recommend continued inhalers; CT- negative for pneumonia.    #Diabetes-A1c 6.9 [AUG 2022]-preprandial blood sugars mostly 160-180s.  Reviewed the home glucose readings.  Recommend glipizide 5 mg BID. Script sent.   # Mediport placement- STABLE.   #Incidental findings on 12/02 Imaging CT: Hepatic steatosis atherosclerosis I reviewed/discussed/counseled the patient.   # Palliative care: : Introduced palliative care philosophy and services. I discussed the need for palliative care evaluation/symptom management to help quality of life in the context of incurable disease.  Patient is interested; will make referral.  #DISPOSITION: # Chemo today # referral to [facility] re: palliative care discussion # 3 week- MD- labs- cbc/cmp; Carbo-Alimta-keytruda; MRI brain prior-  Dr.B  # I reviewed the blood work- with the patient in detail; also reviewed the imaging independently [as summarized above]; and with the patient in detail.

## 2021-01-21 NOTE — Progress Notes (Signed)
Nutrition Follow-up:  Patient with lung cancer, stage IV.  Patient receiving carbo/alimta, keytruda  Met with patient during infusion.  Reports good appetite.  Reports imodium has helped diarrhea.  Noticed mouth sores/irration and started swish and spit and cleared up.  Son has moved in with her and grand-daughter.  Typically eats around 9-10am, maybe grits, toast, cereal and milk.  Noon she will eat yogurt and banana or salad or [place] and cheese sandwich.  Dinner is meat, vegetable and starch. Drinking lower sugar beverages  Medications: reviewed  Labs: reviewed  Anthropometrics:   Weight 191 lb 1.6 oz today  189 lb on 11/16 195 lb 9.6 oz on 10/26   NUTRITION DIAGNOSIS: Food and beverage related knowledge deficit improved   INTERVENTION:  Patient being mindful about higher sugar foods.   Encouraged protein food at every meal    MONITORING, EVALUATION, GOAL: weight trends, intake   NEXT VISIT: Wednesday, Dec 28 during infusion  Felicia Manning B. Zenia Resides, [place], [place] Registered Dietitian [phone] (mobile)

## 2021-01-21 NOTE — Progress Notes (Signed)
[facility] CONSULT NOTE  Patient Care Team: Dion Body, MD as PCP - General (Family Medicine) Leata Mouse (Inactive) Byrnett, Forest Gleason, MD (General Surgery) Telford Nab, RN as Oncology Nurse Navigator  CHIEF COMPLAINTS/PURPOSE OF CONSULTATION: lung cancer  Oncology History Overview Note  IMPRESSION: Hypermetabolic solid pulmonary nodule of the left lower lobe, favor primary lung malignancy.   Numerous hypermetabolic nodules of the lower left pleura, compatible with pleural metastatic disease.   Hypermetabolic subcarinal and left hilar lymph nodes, compatible with metastatic disease.   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 Recommend attention on follow-up.   Mild asymmetric FDG uptake below mediastinal blood pool within a small nodule of the left parotid gland, favored to be benign. Recommend attention on follow-up.   No evidence of metastatic disease in the abdomen or pelvis.   No evidence of osseous metastatic disease.   ---------------------  # MARCH 2022- incidental LLL [ 0.9 cm] s/p COVID vaccine; [Dr.Aleskerov]; July 2022- left pleural pain-   COPD-non-complaint    ------------------   DIAGNOSIS:  A. PLEURAL MASS, LEFT CHEST; BIOPSY:  - DIAGNOSTIC OF MALIGNANCY.  - NON-SMALL CELL CARCINOMA, FAVOR ADENOCARCINOMA.   Comment:  The tumor cells are positive for CK7 and TTF1 (patchy).  They are  negative for p40.   # 12/10/2020-carbo Alimta; cycle #1 [Keytruda pending insurance]  #NGS PD-L1 TPS 1%   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[place])  05/02/2015 Initial Diagnosis   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H[facility])   Cancer of lower lobe of left lung ([place])  12/02/2020 Initial Diagnosis   Cancer of lower lobe of left lung ([place])    12/10/2020 Cancer Staging   Staging form: Lung, AJCC 8th Edition - Clinical: Stage IVA (cT4, cN3, cM1a) - Signed by Cammie Sickle, MD on 12/10/2020    12/10/2020 -  Chemotherapy   Patient is on Treatment Plan : LUNG CARBOplatin / Pemetrexed / Pembrolizumab q21d Induction x 4 cycles / Maintenance Pemetrexed + Pembrolizumab        HISTORY OF PRESENTING ILLNESS: Ambulating independently.  Accompanied by her son.  Felicia Manning 65 y.o.  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  No headaches.  No nausea no vomiting.  No fever no chills.  Mild shortness of breath on exertion.  Patient not using her inhalers.   Review of Systems  Constitutional:  Positive for malaise/fatigue. Negative for chills, diaphoresis, fever and weight loss.  HENT:  Negative for nosebleeds and sore throat.   Eyes:  Negative for double vision.  Respiratory:  Positive for cough and sputum production. Negative for hemoptysis and shortness of breath.   Cardiovascular:  Negative for palpitations, orthopnea and leg swelling.  Gastrointestinal:  Negative for abdominal pain, blood in stool, constipation, diarrhea, heartburn, melena, nausea and vomiting.  Genitourinary:  Negative for dysuria, frequency and urgency.  Musculoskeletal:  Negative for back pain and joint pain.  Skin: Negative.  Negative for itching and rash.  Neurological:  Negative for dizziness, tingling, focal weakness and weakness.  Endo/Heme/Allergies:  Does not bruise/bleed easily.  Psychiatric/Behavioral:  Negative for depression. The patient is not nervous/anxious and does not have insomnia.  MEDICAL HISTORY:  Past Medical History:  Diagnosis Date   Barrett's esophagus    Dysrhythmia    stress related at work.   GERD  (gastroesophageal reflux disease)    Hepatitis C 2008   Hyperlipidemia    Hypertension    Hypothyroidism    Nephrolithiasis    Non-small cell carcinoma of left lung (HCC)    Osteoporosis    Pulmonary mass    Thyroid cancer ([place]) 2012   Thyroid cancer ([place])     SURGICAL HISTORY: Past Surgical History:  Procedure Laterality Date   ABDOMINAL HYSTERECTOMY  2006   COLONOSCOPY WITH PROPOFOL N/A 04/25/2015   Procedure: COLONOSCOPY WITH PROPOFOL;  Surgeon: Josefine Class, MD;  Location: [facility] ENDOSCOPY;  Service: Endoscopy;  Laterality: N/A;   CYSTOSCOPY W/ RETROGRADES Bilateral 05/19/2015   Procedure: CYSTOSCOPY WITH RETROGRADE PYELOGRAM;  Surgeon: Hollice Espy, MD;  Location: [facility] ORS;  Service: Urology;  Laterality: Bilateral;   CYSTOSCOPY W/ URETERAL STENT PLACEMENT Right 05/26/2015   Procedure: CYSTOSCOPY WITH STENT REPLACEMENT;  Surgeon: Hollice Espy, MD;  Location: [facility] ORS;  Service: Urology;  Laterality: Right;   CYSTOSCOPY WITH STENT PLACEMENT Right 05/19/2015   Procedure: CYSTOSCOPY WITH STENT PLACEMENT;  Surgeon: Hollice Espy, MD;  Location: [facility] ORS;  Service: Urology;  Laterality: Right;   CYSTOSCOPY WITH STENT PLACEMENT Right 05/26/2015   Procedure: CYSTOSCOPY WITH STENT PLACEMENT;  Surgeon: Hollice Espy, MD;  Location: [facility] ORS;  Service: Urology;  Laterality: Right;   CYSTOSCOPY WITH STENT PLACEMENT Right 06/25/2016   Procedure: CYSTOSCOPY WITH STENT PLACEMENT;  Surgeon: Nickie Retort, MD;  Location: [facility] ORS;  Service: Urology;  Laterality: Right;   CYSTOSCOPY WITH STENT PLACEMENT Left 10/31/2017   Procedure: CYSTOSCOPY WITH STENT PLACEMENT;  Surgeon: Hollice Espy, MD;  Location: [facility] ORS;  Service: Urology;  Laterality: Left;   CYSTOSCOPY/URETEROSCOPY/HOLMIUM LASER/STENT PLACEMENT Left 07/23/2015   Procedure: CYSTOSCOPY/URETEROSCOPY/HOLMIUM LASER/STENT PLACEMENT/RETROGRADE PYELOGRAM;  Surgeon: Hollice Espy, MD;  Location: [facility] ORS;  Service: Urology;   Laterality: Left;   CYSTOSCOPY/URETEROSCOPY/HOLMIUM LASER/STENT PLACEMENT Left 11/16/2017   Procedure: CYSTOSCOPY/URETEROSCOPY/HOLMIUM LASER/STENT Exchange;  Surgeon: Hollice Espy, MD;  Location: [facility] ORS;  Service: Urology;  Laterality: Left;   ESOPHAGOGASTRODUODENOSCOPY (EGD) WITH PROPOFOL N/A 04/25/2015   Procedure: ESOPHAGOGASTRODUODENOSCOPY (EGD) WITH PROPOFOL;  Surgeon: Josefine Class, MD;  Location: [facility] ENDOSCOPY;  Service: Endoscopy;  Laterality: N/A;   EYE SURGERY Bilateral 1964   lazy eye repair   EYE SURGERY Bilateral 2001   lazy eye repair   IR IMAGING GUIDED PORT INSERTION  12/08/2020   LITHOTRIPSY  2009   THYROIDECTOMY  2010   URETEROSCOPY WITH HOLMIUM LASER LITHOTRIPSY Right 05/19/2015   Procedure: URETEROSCOPY WITH HOLMIUM LASER LITHOTRIPSY;  Surgeon: Hollice Espy, MD;  Location: [facility] ORS;  Service: Urology;  Laterality: Right;   URETEROSCOPY WITH HOLMIUM LASER LITHOTRIPSY Right 05/26/2015   Procedure: URETEROSCOPY WITH HOLMIUM LASER LITHOTRIPSY;  Surgeon: Hollice Espy, MD;  Location: [facility] ORS;  Service: Urology;  Laterality: Right;   URETEROSCOPY WITH HOLMIUM LASER LITHOTRIPSY Right 06/25/2016   Procedure: URETEROSCOPY WITH HOLMIUM LASER LITHOTRIPSY;  Surgeon: Nickie Retort, MD;  Location: [facility] ORS;  Service: Urology;  Laterality: Right;    SOCIAL HISTORY: Social History   Socioeconomic History   Marital status: Divorced    Spouse name: Not on file   Number of children: Not on file   Years of education: Not on file   Highest education level: Not on file  Occupational History   Not on file  Tobacco Use  Smoking status: Every Day    Packs/day: 1.00    Years: 40.00    Pack years: 40.00    Types: Cigarettes   Smokeless tobacco: Never  Vaping Use   Vaping Use: Never used  Substance and Sexual Activity   Alcohol use: Yes    Comment: socially   Drug use: No   Sexual activity: Not Currently    Birth control/protection: None  Other Topics Concern    Not on file  Social History Narrative   Smoking: 1p 1-2 days since 17 years; alcohol: rare; 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     Social Determinants of Health   Financial Resource Strain: Not on file  Food Insecurity: Not on file  Transportation Needs: Not on file  Physical Activity: Not on file  Stress: Not on file  Social Connections: Not on file  Intimate Partner Violence: Not on file    FAMILY HISTORY: Family History  Problem Relation Age of Onset   CAD Mother    Heart disease Mother    Dementia Father    Bladder Cancer Neg Hx    Prostate cancer Neg Hx    Kidney cancer Neg Hx    Breast cancer Neg Hx     ALLERGIES:  has No Known Allergies.  MEDICATIONS:  Current Outpatient Medications  Medication Sig Dispense Refill   alendronate (FOSAMAX) 70 MG tablet One tab every Sunday on empty stomach with a full glass of water. Do not lie down or eat/drink anything else for the next 30 min.     amLODipine (NORVASC) 5 MG tablet Take 5 mg by mouth every morning.      atorvastatin (LIPITOR) 80 MG tablet Take 80 mg by mouth daily.     cholecalciferol (VITAMIN D) 1000 units tablet Take 2,000 Units by mouth daily.     dexamethasone (DECADRON) 4 MG tablet Take 1 tablet by mouth twice a day the day before chemotherapy 30 tablet 0   folic acid (FOLVITE) 1 MG tablet Take 1 tablet (1 mg total) by mouth daily. 30 tablet 2   glipiZIDE (GLUCOTROL) 5 MG tablet Take 1 tablet (5 mg total) by mouth 2 (two) times daily before a meal. 60 tablet 1   HYDROcodone-acetaminophen (NORCO/VICODIN) 5-325 MG tablet Take 1 tablet by mouth every 8 (eight) hours as needed for moderate pain. 60 tablet 0   levothyroxine (SYNTHROID) 175 MCG tablet Sat and Sun ON AN EMPTY STOMACH WITH A GLASS OF WATER AT LEAST 30-60 MINUTES BEFORE BREAKFAST     levothyroxine (SYNTHROID, LEVOTHROID) 150 MCG tablet Take 150 mcg by mouth at bedtime.       lidocaine (LIDODERM) 5 % 1 patch daily.     lidocaine-prilocaine (EMLA) cream Apply 1 application topically as needed. 30 g 0   losartan (COZAAR) 100 MG tablet Take 100 mg by mouth every morning.      metoprolol succinate (TOPROL-XL) 25 MG 24 hr tablet Take 25 mg by mouth every morning.   1   nystatin (MYCOSTATIN) 100000 UNIT/ML suspension Swish and swallow. Don't drink fluids immediately following administration. 400 mL 0   omeprazole (PRILOSEC) 20 MG capsule Take 20 mg by mouth daily before breakfast.      amoxicillin-clavulanate (AUGMENTIN) 875-125 MG tablet Take 1 tablet by mouth 2 (two) times daily. (Patient not taking: No sig reported)     blood glucose meter kit and supplies KIT Dispense based on patient and insurance preference. Use up  to three times daily as directed. (Patient not taking: Reported on 01/21/2021) 1 each 0   Cranberry-Vitamin C-Probiotic (AZO CRANBERRY PO) Take 1 tablet by mouth daily. (Patient not taking: Reported on 12/10/2020)     ondansetron (ZOFRAN) 8 MG tablet Take 1 tablet (8 mg total) by mouth every 8 (eight) hours as needed for nausea or vomiting. Start on the third day after chemotherapy (Patient not taking: Reported on 12/31/2020) 30 tablet 2   predniSONE (DELTASONE) 10 MG tablet Take 10 mg by mouth daily. (Patient not taking: Reported on 12/31/2020)     prochlorperazine (COMPAZINE) 10 MG tablet Take 1 tablet (10 mg total) by mouth every 6 (six) hours as needed for nausea or vomiting. (Patient not taking: Reported on 12/31/2020) 30 tablet 2   traMADol (ULTRAM) 50 MG tablet Take 1 tablet (50 mg total) by mouth every 8 (eight) hours as needed. (Patient not taking: Reported on 12/31/2020) 60 tablet 0   No current facility-administered medications for this visit.   Facility-Administered Medications Ordered in Other Visits  Medication Dose Route Frequency Provider Last Rate Last Admin   CARBOplatin (PARAPLATIN) 630 mg in sodium chloride 0.9 % 250 mL chemo infusion  630  mg Intravenous Once Cammie Sickle, MD       cyanocobalamin ((VITAMIN B-12)) injection 1,000 mcg  1,000 mcg Intramuscular Once Cammie Sickle, MD       dexamethasone (DECADRON) 10 mg in sodium chloride 0.9 % 50 mL IVPB  10 mg Intravenous Once Cammie Sickle, MD       fosaprepitant (EMEND) 150 mg in sodium chloride 0.9 % 145 mL IVPB  150 mg Intravenous Once Charlaine Dalton R, MD       heparin lock flush 100 unit/mL  500 Units Intravenous Once Charlaine Dalton R, MD       heparin lock flush 100 unit/mL  500 Units Intracatheter Once PRN Cammie Sickle, MD       palonosetron (ALOXI) injection 0.25 mg  0.25 mg Intravenous Once Cammie Sickle, MD       pembrolizumab [facility]) 200 mg in sodium chloride 0.9 % 50 mL chemo infusion  200 mg Intravenous Once Charlaine Dalton R, MD       PEMEtrexed (ALIMTA) 1,000 mg in sodium chloride 0.9 % 100 mL chemo infusion  500 mg/m2 (Treatment Plan Recorded) Intravenous Once Charlaine Dalton R, MD       sodium chloride flush (NS) 0.9 % injection 10 mL  10 mL Intravenous PRN Cammie Sickle, MD   10 mL at 01/21/21 0833      .  PHYSICAL EXAMINATION: ECOG PERFORMANCE STATUS: 1 - Symptomatic but completely ambulatory  Vitals:   01/21/21 0850  BP: 139/79  Pulse: 77  Resp: 16  Temp: 98.2 F (36.8 C)    Filed Weights   01/21/21 0850  Weight: 191 lb 1.6 oz (86.7 kg)     Physical Exam Vitals and nursing note reviewed.  HENT:     Head: Normocephalic and atraumatic.     Mouth/Throat:     Pharynx: Oropharynx is clear.  Eyes:     Extraocular Movements: Extraocular movements intact.     Pupils: Pupils are equal, round, and reactive to light.  Cardiovascular:     Rate and Rhythm: Normal rate and regular rhythm.  Pulmonary:     Comments: Decreased breath sounds bilaterally.  Abdominal:     Palpations: Abdomen is soft.  Musculoskeletal:        General: Normal  range of motion.     Cervical back: Normal  range of motion.  Skin:    General: Skin is warm.  Neurological:     General: No focal deficit present.     Mental Status: She is alert and oriented to person, place, and time.  Psychiatric:        Behavior: Behavior normal.        Judgment: Judgment normal.     LABORATORY DATA:  I have reviewed the data as listed Lab Results  Component Value Date   WBC 13.1 (H) 01/21/2021   HGB 10.9 (L) 01/21/2021   HCT 34.4 (L) 01/21/2021   MCV 94.8 01/21/2021   PLT 250 01/21/2021   Recent Labs    12/16/20 1311 12/31/20 0835 01/21/21 0833  NA 136 140 142  K 3.8 3.6 3.8  CL 102 103 107  CO2 28 22 24   GLUCOSE 149* 159* 141*  BUN 16 15 13   CREATININE 0.74 0.68 0.63  CALCIUM 8.3* 8.9 8.2*  GFRNONAA >60 >60 >60  PROT 6.3* 7.4 6.7  ALBUMIN 3.4* 3.7 3.6  AST 20 20 19   ALT 34 23 27  ALKPHOS 60 71 65  BILITOT 0.6 0.2* 0.3    RADIOGRAPHIC STUDIES: I have personally reviewed the radiological images as listed and agreed with the findings in the report. CT CHEST W CONTRAST  Result Date: 01/18/2021 CLINICAL DATA:  A 65 year old female presents for evaluation of non-small cell lung cancer, follow-up assessment. EXAM: CT CHEST WITH CONTRAST TECHNIQUE: Multidetector CT imaging of the chest was performed during intravenous contrast administration. CONTRAST:  34m OMNIPAQUE IOHEXOL 300 MG/ML  SOLN COMPARISON:  Comparison is made with June 06, 2019. 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 Calcified and noncalcified atheromatous plaque of the thoracic aorta without aneurysmal dilation. Normal heart size without substantial pericardial effusion. Normal caliber of the central pulmonary vessels. RIGHT-sided Port-A-Cath terminates in the distal superior vena cava. Mediastinum/Nodes: Post thyroidectomy. Diminished size of subcarinal lymphadenopathy since prior imaging currently approximately 13-14 mm, previously as much  as 18 mm greatest size. Stable appearance of LEFT juxta hilar nodal tissue, approximately 1 cm (image 83/2) Scattered small lymph nodes elsewhere throughout the chest. Lungs/Pleura: Dominant nodule in the LEFT lower lobe (image 128/3) 2.2 x 1.8 cm as compared to 2.3 x 1.8 cm on the previous exam. Pleural nodularity at the LEFT lung base dominant area (image 109/3) approximally 5-6 mm greatest thickness similar to the prior study as is a smaller area of pleural base nodularity in the medial RIGHT lung base (image 125/3) approximately 3-4 mm. Nodular areas in the posterior costodiaphragmatic sulcus similarly stable compared to previous imaging in the LEFT chest. Numerous areas of ground-glass nodularity throughout the chest without substantial change. Small LEFT upper lobe pulmonary nodule (image 92/3) 5 mm, unchanged. Most discrete and well-defined ground-glass nodule in the chest in the RIGHT lower lobe (image 92/3) within 1 mm of prior measurements at 11 x 10 mm. Airways are patent. No consolidation or pleural effusion. Upper Abdomen: Hepatic steatosis. Stable low-attenuation in the RIGHT hepatic lobe. Lobular hepatic contours. No acute findings relative to visualized pancreas, spleen, adrenal glands or kidneys. No acute findings related to the visualized gastrointestinal tract. Small hiatal hernia. Musculoskeletal: No acute bone finding. Subtle periosteal changes about pleural and potential chest wall involvement along the LEFT lateral chest affecting the LEFT lateral ninth rib, these periosteal changes are new. Spinal degenerative changes.  IMPRESSION: Similar appearance of metastatic bronchogenic neoplasm with signs of pleural involvement. Perhaps mild decreased size of dominant lymph node in the chest in the subcarinal region. Numerous areas of ground-glass nodularity throughout the chest without substantial change this remains concerning for multifocal bronchogenic neoplasm, attention on follow-up. New  periosteal reaction about the LEFT lateral ninth rib (image 140/2) this likely reflects early rib involvement from pleural disease now extending into the chest wall, attention on follow-up. Hepatic steatosis. Aortic atherosclerosis. Aortic Atherosclerosis (ICD10-I70.0). Electronically Signed   By: Zetta Bills M.D.   On: 01/18/2021 21:57    ASSESSMENT & PLAN:   Cancer of lower lobe of left lung ([place]) #Stage IV -non-small cell favor adenocarcinoma; on carbo-alita-keytruda #2- DEC 2nd, 2022- September 2022 CT scan- Similar appearance of metastatic bronchogenic neoplasm with signs of pleural involvement; stable bilateral multifocal groundglass opacities; New periosteal reaction about the LEFT lateral ninth rib-likely sclerosis from ongoing therapy.  #Proceed with carbo Alimta Keytruda No. 3. Labs today reviewed;  acceptable for treatment today.   # MRI brain-punctate lesion-asymptomatic [D/w- Dr.Vaslow]; will repeat short-term imaging in 2 months [SRS protocol] or so. Will order MRI today.   # Left chest wall pain:-improved; using hydrocodone prn.   #COPD [Dr.A]: Worse again recommend continued inhalers; CT- negative for pneumonia.    #Diabetes-A1c 6.9 [AUG 2022]-preprandial blood sugars mostly 160-180s.  Reviewed the home glucose readings.  Recommend glipizide 5 mg BID. Script sent.   # Mediport placement- STABLE.   #Incidental findings on 12/02 Imaging CT: Hepatic steatosis atherosclerosis I reviewed/discussed/counseled the patient.   # Palliative care: : Introduced palliative care philosophy and services. I discussed the need for palliative care evaluation/symptom management to help quality of life in the context of incurable disease.  Patient is interested; will make referral.  #DISPOSITION: # Chemo today # referral to [facility] re: palliative care discussion # 3 week- MD- labs- cbc/cmp; Carbo-Alimta-keytruda; MRI brain prior-  Dr.B  # I reviewed the blood work- with the patient in detail;  also reviewed the imaging independently [as summarized above]; and with the patient in detail.          All questions were answered. The patient knows to call the clinic with any problems, questions or concerns.       Cammie Sickle, MD 01/21/2021 9:55 AM

## 2021-01-21 NOTE — Progress Notes (Signed)
Patient has noticed SOBr on exertion.

## 2021-01-21 NOTE — Telephone Encounter (Signed)
Per [facility]- pt requests that we make an addendum to her previous fmla form to reflect continuous leave effective today 01/21/21- 03/18/21 (8 weeks).  Fmla reprocessed and faxed back to her claims [place]. Copy of form will be scanned into pt's chart.

## 2021-01-22 ENCOUNTER — Telehealth: Payer: Self-pay | Admitting: *Deleted

## 2021-01-22 NOTE — Telephone Encounter (Signed)
Patient requires an additional form to be completed for her continuous fmla. Pt requesting 8 weeks cont. Leave for FMLA. Will complete these forms and have provider sign.  FMLA form received on 01/22/21

## 2021-01-23 ENCOUNTER — Other Ambulatory Visit: Payer: Self-pay | Admitting: *Deleted

## 2021-01-23 ENCOUNTER — Encounter: Payer: Self-pay | Admitting: Internal Medicine

## 2021-01-23 DIAGNOSIS — C3432 Malignant neoplasm of lower lobe, left bronchus or lung: Secondary | ICD-10-CM

## 2021-01-23 NOTE — Telephone Encounter (Signed)
Completed Fmla forms faxed

## 2021-01-23 NOTE — Telephone Encounter (Signed)
1037 am - Forms for patient's fmla/disability x 3 completed for continuous leave- pending Dr. Sharmaine Base signature. Will fax to [employer].

## 2021-01-26 ENCOUNTER — Encounter: Payer: Self-pay | Admitting: Internal Medicine

## 2021-01-26 MED ORDER — GLIPIZIDE 5 MG PO TABS: 5.0000 mg | ORAL_TABLET | Freq: Two times a day (BID) | ORAL | 1 refills | Status: DC

## 2021-01-26 MED ORDER — FOLIC ACID 1 MG PO TABS: 1.0000 mg | ORAL_TABLET | Freq: Every day | ORAL | 1 refills | Status: DC

## 2021-01-26 MED ORDER — ACCU-CHEK SOFTCLIX LANCETS MISC: 12 refills | Status: DC

## 2021-01-28 ENCOUNTER — Inpatient Hospital Stay (HOSPITAL_BASED_OUTPATIENT_CLINIC_OR_DEPARTMENT_OTHER): Payer: 59 | Admitting: Hospice and Palliative Medicine

## 2021-01-28 ENCOUNTER — Other Ambulatory Visit: Payer: Self-pay

## 2021-01-28 VITALS — BP 130/80 | HR 71 | Temp 98.2°F

## 2021-01-28 DIAGNOSIS — C3432 Malignant neoplasm of lower lobe, left bronchus or lung: Secondary | ICD-10-CM

## 2021-01-28 DIAGNOSIS — J449 Chronic obstructive pulmonary disease, unspecified: Secondary | ICD-10-CM

## 2021-01-28 DIAGNOSIS — Z515 Encounter for palliative care: Secondary | ICD-10-CM | POA: Diagnosis not present

## 2021-01-28 DIAGNOSIS — Z5111 Encounter for antineoplastic chemotherapy: Secondary | ICD-10-CM | POA: Diagnosis not present

## 2021-01-28 MED ORDER — ALBUTEROL SULFATE HFA 108 (90 BASE) MCG/ACT IN AERS: 2.0000 | INHALATION_SPRAY | Freq: Four times a day (QID) | RESPIRATORY_TRACT | 2 refills | Status: DC | PRN

## 2021-01-28 MED ORDER — ADVAIR HFA 45-21 MCG/ACT IN AERO: 2.0000 | INHALATION_SPRAY | Freq: Two times a day (BID) | RESPIRATORY_TRACT | 12 refills | Status: DC

## 2021-01-28 NOTE — Progress Notes (Signed)
[facility] [facility] [phone] [phone]   Name: Atiyana Welte Date: 01/28/2021 MRN: [id number]  DOB: 05-25-1955  Patient Care Team: Dion Body, MD as PCP - General (Family Medicine) Leata Mouse (Inactive) Byrnett, Forest Gleason, MD (General Surgery) Telford Nab, RN as Oncology Nurse Navigator    REASON FOR CONSULTATION: Irmgard Rampersaud is a 65 y.o. female with multiple medical problems including including COPD, diabetes, and stage IV non-small cell lung cancer, initially diagnosed October 2022, on chemotherapy/immunotherapy.  Patient was referred to palliative care to discuss goals and provide ongoing support for symptoms.  SOCIAL HISTORY:     reports that she has been smoking cigarettes. She has a 40.00 pack-year smoking history. She has never used smokeless tobacco. She reports current alcohol use. She reports that she does not use drugs.  Patient is divorced.  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  Patient is a guardian of her 58 year old granddaughter.  Patient's son recently moved here from [place] to live with her.  Patient also has a son in [place], a daughter in [place], and another daughter in [place].  ADVANCE DIRECTIVES:  Does not have  CODE STATUS: DNR/DNI (MOST form completed on 01/28/21)  PAST MEDICAL HISTORY: Past Medical History:  Diagnosis Date   Barrett's esophagus    Dysrhythmia    stress related at work.   GERD (gastroesophageal reflux disease)    Hepatitis C 2008   Hyperlipidemia    Hypertension    Hypothyroidism    Nephrolithiasis    Non-small cell carcinoma of left lung (HCC)    Osteoporosis    Pulmonary mass    Thyroid cancer ([place]) 2012   Thyroid cancer ([place])     PAST SURGICAL HISTORY:  Past Surgical History:  Procedure Laterality Date   ABDOMINAL HYSTERECTOMY  2006   COLONOSCOPY WITH PROPOFOL N/A 04/25/2015   Procedure:  COLONOSCOPY WITH PROPOFOL;  Surgeon: Josefine Class, MD;  Location: [facility] ENDOSCOPY;  Service: Endoscopy;  Laterality: N/A;   CYSTOSCOPY W/ RETROGRADES Bilateral 05/19/2015   Procedure: CYSTOSCOPY WITH RETROGRADE PYELOGRAM;  Surgeon: Hollice Espy, MD;  Location: [facility] ORS;  Service: Urology;  Laterality: Bilateral;   CYSTOSCOPY W/ URETERAL STENT PLACEMENT Right 05/26/2015   Procedure: CYSTOSCOPY WITH STENT REPLACEMENT;  Surgeon: Hollice Espy, MD;  Location: [facility] ORS;  Service: Urology;  Laterality: Right;   CYSTOSCOPY WITH STENT PLACEMENT Right 05/19/2015   Procedure: CYSTOSCOPY WITH STENT PLACEMENT;  Surgeon: Hollice Espy, MD;  Location: [facility] ORS;  Service: Urology;  Laterality: Right;   CYSTOSCOPY WITH STENT PLACEMENT Right 05/26/2015   Procedure: CYSTOSCOPY WITH STENT PLACEMENT;  Surgeon: Hollice Espy, MD;  Location: [facility] ORS;  Service: Urology;  Laterality: Right;   CYSTOSCOPY WITH STENT PLACEMENT Right 06/25/2016   Procedure: CYSTOSCOPY WITH STENT PLACEMENT;  Surgeon: Nickie Retort, MD;  Location: [facility] ORS;  Service: Urology;  Laterality: Right;   CYSTOSCOPY WITH STENT PLACEMENT Left 10/31/2017   Procedure: CYSTOSCOPY WITH STENT PLACEMENT;  Surgeon: Hollice Espy, MD;  Location: [facility] ORS;  Service: Urology;  Laterality: Left;   CYSTOSCOPY/URETEROSCOPY/HOLMIUM LASER/STENT PLACEMENT Left 07/23/2015   Procedure: CYSTOSCOPY/URETEROSCOPY/HOLMIUM LASER/STENT PLACEMENT/RETROGRADE PYELOGRAM;  Surgeon: Hollice Espy, MD;  Location: [facility] ORS;  Service: Urology;  Laterality: Left;   CYSTOSCOPY/URETEROSCOPY/HOLMIUM LASER/STENT PLACEMENT Left 11/16/2017   Procedure: CYSTOSCOPY/URETEROSCOPY/HOLMIUM LASER/STENT Exchange;  Surgeon: Hollice Espy, MD;  Location: [facility] ORS;  Service: Urology;  Laterality: Left;   ESOPHAGOGASTRODUODENOSCOPY (EGD) WITH PROPOFOL N/A 04/25/2015  Procedure: ESOPHAGOGASTRODUODENOSCOPY (EGD) WITH PROPOFOL;  Surgeon: Josefine Class, MD;  Location: [facility] ENDOSCOPY;   Service: Endoscopy;  Laterality: N/A;   EYE SURGERY Bilateral 1964   lazy eye repair   EYE SURGERY Bilateral 2001   lazy eye repair   IR IMAGING GUIDED PORT INSERTION  12/08/2020   LITHOTRIPSY  2009   THYROIDECTOMY  2010   URETEROSCOPY WITH HOLMIUM LASER LITHOTRIPSY Right 05/19/2015   Procedure: URETEROSCOPY WITH HOLMIUM LASER LITHOTRIPSY;  Surgeon: Hollice Espy, MD;  Location: [facility] ORS;  Service: Urology;  Laterality: Right;   URETEROSCOPY WITH HOLMIUM LASER LITHOTRIPSY Right 05/26/2015   Procedure: URETEROSCOPY WITH HOLMIUM LASER LITHOTRIPSY;  Surgeon: Hollice Espy, MD;  Location: [facility] ORS;  Service: Urology;  Laterality: Right;   URETEROSCOPY WITH HOLMIUM LASER LITHOTRIPSY Right 06/25/2016   Procedure: URETEROSCOPY WITH HOLMIUM LASER LITHOTRIPSY;  Surgeon: Nickie Retort, MD;  Location: [facility] ORS;  Service: Urology;  Laterality: Right;    HEMATOLOGY/ONCOLOGY HISTORY:  Oncology History Overview Note  IMPRESSION: Hypermetabolic solid pulmonary nodule of the left lower lobe, favor primary lung malignancy.   Numerous hypermetabolic nodules of the lower left pleura, compatible with pleural metastatic disease.   Hypermetabolic subcarinal and left hilar lymph nodes, compatible with metastatic disease.   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 Recommend attention on follow-up.   Mild asymmetric FDG uptake below mediastinal blood pool within a small nodule of the left parotid gland, favored to be benign. Recommend attention on follow-up.   No evidence of metastatic disease in the abdomen or pelvis.   No evidence of osseous metastatic disease.   ---------------------  # MARCH 2022- incidental LLL [ 0.9 cm] s/p COVID vaccine; [Dr.Aleskerov]; July 2022- left pleural pain-    COPD-non-complaint    ------------------   DIAGNOSIS:  A. PLEURAL MASS, LEFT CHEST; BIOPSY:  - DIAGNOSTIC OF MALIGNANCY.  - NON-SMALL CELL CARCINOMA, FAVOR ADENOCARCINOMA.   Comment:  The tumor cells are positive for CK7 and TTF1 (patchy).  They are  negative for p40.   # 12/10/2020-carbo Alimta; cycle #1 [Keytruda pending insurance]  #NGS PD-L1 TPS 1%   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[place])  05/02/2015 Initial Diagnosis   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HCC)   Cancer of lower lobe of left lung ([place])  12/02/2020 Initial Diagnosis   Cancer of lower lobe of left lung ([place])   12/10/2020 Cancer Staging   Staging form: Lung, AJCC 8th Edition - Clinical: Stage IVA (cT4, cN3, cM1a) - Signed by Cammie Sickle, MD on 12/10/2020    12/10/2020 -  Chemotherapy   Patient is on Treatment Plan : LUNG CARBOplatin / Pemetrexed / Pembrolizumab q21d Induction x 4 cycles / Maintenance Pemetrexed + Pembrolizumab       ALLERGIES:  has No Known Allergies.  MEDICATIONS:  Current Outpatient Medications  Medication Sig Dispense Refill   Accu-Chek Softclix Lancets lancets Use as instructed 100 each 12   alendronate (FOSAMAX) 70 MG tablet One tab every Sunday on empty stomach with a full glass of water. Do not lie down or eat/drink anything else for the next 30 min.     amLODipine (NORVASC) 5 MG tablet Take 5 mg by mouth every morning.      amoxicillin-clavulanate (AUGMENTIN) 875-125 MG tablet Take 1 tablet by mouth 2 (two) times daily. (Patient not taking: No sig  reported)     atorvastatin (LIPITOR) 80 MG tablet Take 80 mg by mouth daily.     blood glucose meter kit and supplies KIT Dispense based on patient and insurance preference. Use up to three times daily as directed. (Patient not taking: Reported on 01/21/2021) 1 each 0   cholecalciferol (VITAMIN D) 1000 units tablet Take 2,000 Units by mouth daily.     Cranberry-Vitamin C-Probiotic (AZO CRANBERRY PO) Take 1 tablet by  mouth daily. (Patient not taking: Reported on 12/10/2020)     dexamethasone (DECADRON) 4 MG tablet Take 1 tablet by mouth twice a day the day before chemotherapy 30 tablet 0   folic acid (FOLVITE) 1 MG tablet Take 1 tablet (1 mg total) by mouth daily. 90 tablet 1   glipiZIDE (GLUCOTROL) 5 MG tablet Take 1 tablet (5 mg total) by mouth 2 (two) times daily before a meal. 180 tablet 1   HYDROcodone-acetaminophen (NORCO/VICODIN) 5-325 MG tablet Take 1 tablet by mouth every 8 (eight) hours as needed for moderate pain. 60 tablet 0   levothyroxine (SYNTHROID) 175 MCG tablet Sat and Sun ON AN EMPTY STOMACH WITH A GLASS OF WATER AT LEAST 30-60 MINUTES BEFORE BREAKFAST     levothyroxine (SYNTHROID, LEVOTHROID) 150 MCG tablet Take 150 mcg by mouth at bedtime.      lidocaine (LIDODERM) 5 % 1 patch daily.     lidocaine-prilocaine (EMLA) cream Apply 1 application topically as needed. 30 g 0   losartan (COZAAR) 100 MG tablet Take 100 mg by mouth every morning.      metoprolol succinate (TOPROL-XL) 25 MG 24 hr tablet Take 25 mg by mouth every morning.   1   nystatin (MYCOSTATIN) 100000 UNIT/ML suspension Swish and swallow. Don't drink fluids immediately following administration. 400 mL 0   omeprazole (PRILOSEC) 20 MG capsule Take 20 mg by mouth daily before breakfast.      ondansetron (ZOFRAN) 8 MG tablet Take 1 tablet (8 mg total) by mouth every 8 (eight) hours as needed for nausea or vomiting. Start on the third day after chemotherapy (Patient not taking: Reported on 12/31/2020) 30 tablet 2   predniSONE (DELTASONE) 10 MG tablet Take 10 mg by mouth daily. (Patient not taking: Reported on 12/31/2020)     prochlorperazine (COMPAZINE) 10 MG tablet Take 1 tablet (10 mg total) by mouth every 6 (six) hours as needed for nausea or vomiting. (Patient not taking: Reported on 12/31/2020) 30 tablet 2   traMADol (ULTRAM) 50 MG tablet Take 1 tablet (50 mg total) by mouth every 8 (eight) hours as needed. (Patient not taking:  Reported on 12/31/2020) 60 tablet 0   No current facility-administered medications for this visit.    VITAL SIGNS: There were no vitals taken for this visit. There were no vitals filed for this visit.  Estimated body mass index is 31.8 kg/m as calculated from the following:   Height as of 12/08/20: 5' 5"  (1.651 m).   Weight as of 01/21/21: 191 lb 1.6 oz (86.7 kg).  LABS: CBC:    Component Value Date/Time   WBC 13.1 (H) 01/21/2021 0833   HGB 10.9 (L) 01/21/2021 0833   HCT 34.4 (L) 01/21/2021 0833   PLT 250 01/21/2021 0833   MCV 94.8 01/21/2021 0833   NEUTROABS 10.7 (H) 01/21/2021 0833   LYMPHSABS 1.1 01/21/2021 0833   MONOABS 1.1 (H) 01/21/2021 0833   EOSABS 0.0 01/21/2021 0833   BASOSABS 0.0 01/21/2021 0833   Comprehensive Metabolic Panel:    Component Value  Date/Time   NA 142 01/21/2021 0833   K 3.8 01/21/2021 0833   CL 107 01/21/2021 0833   CO2 24 01/21/2021 0833   BUN 13 01/21/2021 0833   CREATININE 0.63 01/21/2021 0833   GLUCOSE 141 (H) 01/21/2021 0833   CALCIUM 8.2 (L) 01/21/2021 0833   AST 19 01/21/2021 0833   ALT 27 01/21/2021 0833   ALKPHOS 65 01/21/2021 0833   BILITOT 0.3 01/21/2021 0833   PROT 6.7 01/21/2021 0833   ALBUMIN 3.6 01/21/2021 0833    RADIOGRAPHIC STUDIES: CT CHEST W CONTRAST  Result Date: 01/18/2021 CLINICAL DATA:  A 66 year old female presents for evaluation of non-small cell lung cancer, follow-up assessment. EXAM: CT CHEST WITH CONTRAST TECHNIQUE: Multidetector CT imaging of the chest was performed during intravenous contrast administration. CONTRAST:  96m OMNIPAQUE IOHEXOL 300 MG/ML  SOLN COMPARISON:  Comparison is made with June 06, 2019. 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 Calcified and noncalcified atheromatous plaque of the thoracic aorta without aneurysmal dilation. Normal heart size without substantial pericardial effusion. Normal caliber of  the central pulmonary vessels. RIGHT-sided Port-A-Cath terminates in the distal superior vena cava. Mediastinum/Nodes: Post thyroidectomy. Diminished size of subcarinal lymphadenopathy since prior imaging currently approximately 13-14 mm, previously as much as 18 mm greatest size. Stable appearance of LEFT juxta hilar nodal tissue, approximately 1 cm (image 83/2) Scattered small lymph nodes elsewhere throughout the chest. Lungs/Pleura: Dominant nodule in the LEFT lower lobe (image 128/3) 2.2 x 1.8 cm as compared to 2.3 x 1.8 cm on the previous exam. Pleural nodularity at the LEFT lung base dominant area (image 109/3) approximally 5-6 mm greatest thickness similar to the prior study as is a smaller area of pleural base nodularity in the medial RIGHT lung base (image 125/3) approximately 3-4 mm. Nodular areas in the posterior costodiaphragmatic sulcus similarly stable compared to previous imaging in the LEFT chest. Numerous areas of ground-glass nodularity throughout the chest without substantial change. Small LEFT upper lobe pulmonary nodule (image 92/3) 5 mm, unchanged. Most discrete and well-defined ground-glass nodule in the chest in the RIGHT lower lobe (image 92/3) within 1 mm of prior measurements at 11 x 10 mm. Airways are patent. No consolidation or pleural effusion. Upper Abdomen: Hepatic steatosis. Stable low-attenuation in the RIGHT hepatic lobe. Lobular hepatic contours. No acute findings relative to visualized pancreas, spleen, adrenal glands or kidneys. No acute findings related to the visualized gastrointestinal tract. Small hiatal hernia. Musculoskeletal: No acute bone finding. Subtle periosteal changes about pleural and potential chest wall involvement along the LEFT lateral chest affecting the LEFT lateral ninth rib, these periosteal changes are new. Spinal degenerative changes. IMPRESSION: Similar appearance of metastatic bronchogenic neoplasm with signs of pleural involvement. Perhaps mild  decreased size of dominant lymph node in the chest in the subcarinal region. Numerous areas of ground-glass nodularity throughout the chest without substantial change this remains concerning for multifocal bronchogenic neoplasm, attention on follow-up. New periosteal reaction about the LEFT lateral ninth rib (image 140/2) this likely reflects early rib involvement from pleural disease now extending into the chest wall, attention on follow-up. Hepatic steatosis. Aortic atherosclerosis. Aortic Atherosclerosis (ICD10-I70.0). Electronically Signed   By: GZetta BillsM.D.   On: 01/18/2021 21:57    PERFORMANCE STATUS (ECOG) : 1 - Symptomatic but completely ambulatory  Review of Systems Unless otherwise noted, a complete review of systems is negative.  Physical Exam General: NAD Cardiovascular: regular rate and rhythm Pulmonary: clear  ant fields Extremities: no edema, no joint deformities Skin: no rashes Neurological: Grossly nonfocal  IMPRESSION: I met with patient and her son Ernie.    Overall, patient appears to be doing well.  She has improved pain but still has some residual shortness of breath and occasional wheezing, particularly with exertion such as ambulating up steps.  Patient would like to restart Advair and asked me to refill her albuterol inhaler.  She is taking Norco as needed, primarily for cough.  She reports that she is sleeping well and emotionally coping with her cancer/treatment.  We had a lengthy conversation regarding advance directives.  Patient plans to go by her bank today to sign ACP documents.  Patient states clearly that she would not want to be resuscitated or have her life prolonged artificially machines.  She would be interested in short-term hospitalization if necessary but would not want to go to a nursing facility.  We discussed expectations with both prognosis and possible future symptoms.  Patient is aware that her cancer is incurable.  She is in agreement with  the current scope of treatment.  Patient is currently on FMLA for work.  She plans to apply for disability.  She would be interested in speaking with social work for advice/coordination.  Patient reports good social support from her family.  She is currently the legal guardian of her 36 year old granddaughter.  We discussed [facility] program.    I completed a MOST form today. The patient and family outlined their wishes for the following treatment decisions:  Cardiopulmonary Resuscitation: Do Not Attempt Resuscitation (DNR/No CPR)  Medical Interventions: Limited Additional Interventions: Use medical treatment, IV fluids and cardiac monitoring as indicated, DO NOT USE intubation or mechanical ventilation. May consider use of less invasive airway support such as BiPAP or CPAP. Also provide comfort measures. Transfer to the hospital if indicated. Avoid intensive care.   Antibiotics: Antibiotics if indicated  IV Fluids: IV fluids if indicated  Feeding Tube: No feeding tube     PLAN: -Continue current scope of treatment -Norco as needed for pain -Restart Advair inhaler -Albuterol inhaler as needed for wheezing -May use Magic mouthwash/nystatin elixir if needed -ACP documents reviewed -MOST Form completed -DNR/DNI -Referral to social work for questions about Medicaid/disability process -RTC follow-up telephone visit 1 month   Patient expressed understanding and was in agreement with this plan. She also understands that She can call the clinic at any time with any questions, concerns, or complaints.     Time Total: 45 minutes  Visit consisted of counseling and education dealing with the complex and emotionally intense issues of symptom management and palliative care in the setting of serious and potentially life-threatening illness.Greater than 50%  of this time was spent counseling and coordinating care related to the above assessment and plan.  Signed by: Altha Harm, PhD,  NP-C

## 2021-01-28 NOTE — Progress Notes (Signed)
Initial palliative consult. Pt would like to review her advanced directives and discuss what resources may be available to her during treatment.

## 2021-01-29 ENCOUNTER — Telehealth: Payer: Self-pay | Admitting: *Deleted

## 2021-01-29 ENCOUNTER — Other Ambulatory Visit: Payer: Self-pay

## 2021-01-29 MED ORDER — ACCU-CHEK SOFTCLIX LANCETS MISC: 12 refills | Status: DC

## 2021-01-29 NOTE — Telephone Encounter (Signed)
Rx resent with directions.

## 2021-01-29 NOTE — Telephone Encounter (Signed)
[employer] called stating they need to clarify the frequency of Prescription sent yesterday for The Accu cheks. Please resend prescription with this information

## 2021-01-29 NOTE — Telephone Encounter (Signed)
Fax from pharmacy that Ventolin Inhaler is not covered by patient insurance and asking for an alternative prescription to be sent. Recommends Albuterol HFA Inhaler

## 2021-01-29 NOTE — Telephone Encounter (Signed)
My apologies, looks like you ordered Albuterol Inhaler yesterday

## 2021-01-30 ENCOUNTER — Encounter: Payer: Self-pay | Admitting: Licensed Clinical Social Worker

## 2021-01-30 NOTE — Progress Notes (Signed)
[facility]  Initial Assessment   Felicia Manning is a 65 y.o. year old female contacted by phone. Clinical Social Work was referred by [facility]  Initial Assessment   Felicia Manning is a 65 y.o. year old female contacted by phone. Clinical Social Work was referred by [facility] RN for assessment of psychosocial needs.   SDOH (Social Determinants of Health) assessments performed: No   Distress Screen completed: No ONCBCN DISTRESS SCREENING 11/14/2020  Screening Type Initial Screening  Distress experienced in past week (1-10) 4      Family/Social Information:  Housing Arrangement: patient lives with 76 y/o granddaughter and son   Family members/support persons in your life? Family Transportation concerns: no  Employment: Out on work excuse/FMLA. Income source: Employment Financial concerns: Yes, due to illness and/or loss of work during treatment. Patient has spoken with social security office and has applied for disability. Type of concern: Medical bills Food access concerns: no Religious or spiritual practice: did not discuss Medication Concerns: no  Services Currently in place:  Yes  Coping/ Adjustment to diagnosis: Patient understands treatment plan and what happens next? yes Concerns about diagnosis and/or treatment: How I will care for other members of my family, Losing my job, and How I will pay for the services I need Patient reported stressors: Work/ school, Finances, and Adjusting to my illness Hopes and priorities: patient stated her main priority now is to have her disability and Medicaid in place before her FMLA runs out, she has eight weeks of FMLA left   Current coping skills/ strengths: Capable of independent living  and Supportive family/friends     SUMMARY: Current SDOH Barriers:  Financial constraints related to running out of her FMLA, before she gets disability and Medicaid and concerns over her 63 y/o granddaughter of whom  she has custody.   Clinical Social Work Clinical Goal(s):  patient will work with Psychologist, prison and probation services and servant center to address needs related to disability and Medicaid  approval  Interventions: Discussed common feeling and emotions when being diagnosed with cancer, and the importance of support during treatment Informed patient of the support team roles and support services at [facility] Provided [place] contact information and encouraged patient to call with any questions or concerns Provided patient with information about Medicaid and disability criteria, encouraged patient to contact social security office to discuss the amount she qualifies for and whether that will affect her getting medicaid.     Follow Up Plan: Patient will contact CSW with any support or resource needs and CSW will follow-up with patient by phone  Patient verbalizes understanding of plan: Yes    Adelene Amas , LCSW  for assessment of psychosocial needs.

## 2021-02-01 ENCOUNTER — Other Ambulatory Visit: Payer: Self-pay

## 2021-02-02 ENCOUNTER — Encounter: Payer: Self-pay | Admitting: Internal Medicine

## 2021-02-02 NOTE — Progress Notes (Signed)
[facility] CSW Progress Note  Clinical Social Worker  spoke with Lily Peer, Hotel manager for [employer]  to discuss conversation with the patient in reference to disability and Medicaid qualification.  Felicia Manning stated she would contact patient to discuss Medicaid application.   Adelene Amas, [place]

## 2021-02-03 ENCOUNTER — Other Ambulatory Visit: Payer: Self-pay

## 2021-02-03 ENCOUNTER — Ambulatory Visit
Admission: RE | Admit: 2021-02-03 | Discharge: 2021-02-03 | Disposition: A | Discharge status: Home / Self Care | Payer: 59 | Source: Ambulatory Visit | Attending: Internal Medicine | Admitting: Internal Medicine

## 2021-02-03 DIAGNOSIS — C3432 Malignant neoplasm of lower lobe, left bronchus or lung: Secondary | ICD-10-CM | POA: Diagnosis not present

## 2021-02-03 MED ORDER — GADOBUTROL 1 MMOL/ML IV SOLN
8.0000 mL | Freq: Once | INTRAVENOUS | Status: AC | PRN
  Administered 2021-02-03: 13:00:00 8 mL via INTRAVENOUS

## 2021-02-11 ENCOUNTER — Encounter: Payer: Self-pay | Admitting: Internal Medicine

## 2021-02-11 ENCOUNTER — Inpatient Hospital Stay: Payer: 59

## 2021-02-11 ENCOUNTER — Inpatient Hospital Stay (HOSPITAL_BASED_OUTPATIENT_CLINIC_OR_DEPARTMENT_OTHER): Payer: 59 | Admitting: Internal Medicine

## 2021-02-11 ENCOUNTER — Other Ambulatory Visit: Payer: Self-pay

## 2021-02-11 DIAGNOSIS — C3432 Malignant neoplasm of lower lobe, left bronchus or lung: Secondary | ICD-10-CM

## 2021-02-11 DIAGNOSIS — Z5111 Encounter for antineoplastic chemotherapy: Secondary | ICD-10-CM | POA: Diagnosis not present

## 2021-02-11 LAB — COMPREHENSIVE METABOLIC PANEL
ALT: 28 U/L (ref 0–44)
AST: 25 U/L (ref 15–41)
Albumin: 3.7 g/dL (ref 3.5–5.0)
Alkaline Phosphatase: 62 U/L (ref 38–126)
Anion gap: 10 (ref 5–15)
BUN: 13 mg/dL (ref 8–23)
CO2: 24 mmol/L (ref 22–32)
Calcium: 8.7 mg/dL — ABNORMAL LOW (ref 8.9–10.3)
Chloride: 104 mmol/L (ref 98–111)
Creatinine, Ser: 0.76 mg/dL (ref 0.44–1.00)
GFR, Estimated: >60 mL/min (ref >60)
Glucose, Bld: 144 mg/dL — ABNORMAL HIGH (ref 70–99)
Potassium: 3.5 mmol/L (ref 3.5–5.1)
Sodium: 138 mmol/L (ref 135–145)
Total Bilirubin: 0.3 mg/dL (ref 0.3–1.2)
Total Protein: 7.1 g/dL (ref 6.5–8.1)

## 2021-02-11 LAB — CBC WITH DIFFERENTIAL/PLATELET
Abs Immature Granulocytes: 0.08 10*3/uL — ABNORMAL HIGH (ref 0.00–0.07)
Basophils Absolute: 0 10*3/uL (ref 0.0–0.1)
Basophils Relative: 0 %
Eosinophils Absolute: 0 10*3/uL (ref 0.0–0.5)
Eosinophils Relative: 0 %
HCT: 34.8 % — ABNORMAL LOW (ref 36.0–46.0)
Hemoglobin: 11 g/dL — ABNORMAL LOW (ref 12.0–15.0)
Immature Granulocytes: 1 %
Lymphocytes Relative: 12 %
Lymphs Abs: 1.2 10*3/uL (ref 0.7–4.0)
MCH: 30.6 pg (ref 26.0–34.0)
MCHC: 31.6 g/dL (ref 30.0–36.0)
MCV: 96.9 fL (ref 80.0–100.0)
Monocytes Absolute: 0.5 10*3/uL (ref 0.1–1.0)
Monocytes Relative: 5 %
Neutro Abs: 8 10*3/uL — ABNORMAL HIGH (ref 1.7–7.7)
Neutrophils Relative %: 82 %
Platelets: 215 10*3/uL (ref 150–400)
RBC: 3.59 MIL/uL — ABNORMAL LOW (ref 3.87–5.11)
RDW: 20.8 % — ABNORMAL HIGH (ref 11.5–15.5)
WBC: 9.7 10*3/uL (ref 4.0–10.5)
nRBC: 0 % (ref 0.0–0.2)

## 2021-02-11 MED ORDER — SODIUM CHLORIDE 0.9 % IV SOLN
Freq: Once | INTRAVENOUS | Status: AC
  Filled 2021-02-11: qty 250

## 2021-02-11 MED ORDER — SODIUM CHLORIDE 0.9 % IV SOLN
500.0000 mg/m2 | Freq: Once | INTRAVENOUS | Status: AC
  Administered 2021-02-11: 12:00:00 1000 mg via INTRAVENOUS
  Filled 2021-02-11: qty 40

## 2021-02-11 MED ORDER — SODIUM CHLORIDE 0.9 % IV SOLN
150.0000 mg | Freq: Once | INTRAVENOUS | Status: AC
  Administered 2021-02-11: 11:00:00 150 mg via INTRAVENOUS
  Filled 2021-02-11: qty 5

## 2021-02-11 MED ORDER — HEPARIN SOD (PORK) LOCK FLUSH 100 UNIT/ML IV SOLN
500.0000 [IU] | Freq: Once | INTRAVENOUS | Status: AC | PRN
  Filled 2021-02-11: qty 5

## 2021-02-11 MED ORDER — SODIUM CHLORIDE 0.9 % IV SOLN
200.0000 mg | Freq: Once | INTRAVENOUS | Status: AC
  Administered 2021-02-11: 11:00:00 200 mg via INTRAVENOUS
  Filled 2021-02-11: qty 8

## 2021-02-11 MED ORDER — SODIUM CHLORIDE 0.9 % IV SOLN
10.0000 mg | Freq: Once | INTRAVENOUS | Status: AC
  Administered 2021-02-11: 10:00:00 10 mg via INTRAVENOUS
  Filled 2021-02-11: qty 1

## 2021-02-11 MED ORDER — HEPARIN SOD (PORK) LOCK FLUSH 100 UNIT/ML IV SOLN
INTRAVENOUS | Status: AC
  Administered 2021-02-11: 13:00:00 500 [IU]
  Filled 2021-02-11: qty 5

## 2021-02-11 MED ORDER — PALONOSETRON HCL INJECTION 0.25 MG/5ML
0.2500 mg | Freq: Once | INTRAVENOUS | Status: AC
  Administered 2021-02-11: 10:00:00 0.25 mg via INTRAVENOUS
  Filled 2021-02-11: qty 5

## 2021-02-11 MED ORDER — SODIUM CHLORIDE 0.9 % IV SOLN
628.0000 mg | Freq: Once | INTRAVENOUS | Status: AC
  Administered 2021-02-11: 12:00:00 630 mg via INTRAVENOUS
  Filled 2021-02-11: qty 63

## 2021-02-11 NOTE — Progress Notes (Signed)
Pt states she is getting SOB with walking stairs, hip pain and weakness when walking. She saw her eye dr, was referred to a retinal specialist due to a scar on the rt eye.

## 2021-02-11 NOTE — Progress Notes (Signed)
[place] CONSULT NOTE  Patient Care Team: Dion Body, MD as PCP - General (Family Medicine) Leata Mouse (Inactive) Byrnett, Forest Gleason, MD (General Surgery) Telford Nab, RN as Oncology Nurse Navigator  CHIEF COMPLAINTS/PURPOSE OF CONSULTATION: lung cancer  Oncology History Overview Note  IMPRESSION: Hypermetabolic solid pulmonary nodule of the left lower lobe, favor primary lung malignancy.   Numerous hypermetabolic nodules of the lower left pleura, compatible with pleural metastatic disease.   Hypermetabolic subcarinal and left hilar lymph nodes, compatible with metastatic disease.   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 Recommend attention on follow-up.   Mild asymmetric FDG uptake below mediastinal blood pool within a small nodule of the left parotid gland, favored to be benign. Recommend attention on follow-up.   No evidence of metastatic disease in the abdomen or pelvis.   No evidence of osseous metastatic disease.   ---------------------  # MARCH 2022- incidental LLL [ 0.9 cm] s/p COVID vaccine; [Dr.Aleskerov]; July 2022- left pleural pain-   COPD-non-complaint    ------------------   DIAGNOSIS:  A. PLEURAL MASS, LEFT CHEST; BIOPSY:  - DIAGNOSTIC OF MALIGNANCY.  - NON-SMALL CELL CARCINOMA, FAVOR ADENOCARCINOMA.   Comment:  The tumor cells are positive for CK7 and TTF1 (patchy).  They are  negative for p40.   # 12/10/2020-carbo Alimta; cycle #1 [Keytruda pending insurance]  #NGS PD-L1 TPS 1%   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[place])  05/02/2015 Initial Diagnosis   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H[facility])   Cancer of lower lobe of left lung ([place])  12/02/2020 Initial Diagnosis   Cancer of lower lobe of left lung ([place])    12/10/2020 Cancer Staging   Staging form: Lung, AJCC 8th Edition - Clinical: Stage IVA (cT4, cN3, cM1a) - Signed by Cammie Sickle, MD on 12/10/2020    12/10/2020 -  Chemotherapy   Patient is on Treatment Plan : LUNG CARBOplatin / Pemetrexed / Pembrolizumab q21d Induction x 4 cycles / Maintenance Pemetrexed + Pembrolizumab        HISTORY OF PRESENTING ILLNESS: Ambulating independently.  Accompanied by her son.  Felicia Manning 65 y.o.  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  Patient continues to have improvement of her left chest wall pain.  Denies any headaches or nausea or vomiting.  Complains of mild to moderate fatigue/diarrhea postchemotherapy.  Currently improved.  Continues have shortness of breath-refilled Advair not using it.  She has been using albuterol as needed.  Of note patient had a fall few weeks ago which she attributes to weakness in the legs.  Review of Systems  Constitutional:  Positive for malaise/fatigue. Negative for chills, diaphoresis, fever and weight loss.  HENT:  Negative for nosebleeds and sore throat.   Eyes:  Negative for double vision.  Respiratory:  Positive for cough and sputum production. Negative for hemoptysis and shortness of breath.   Cardiovascular:  Negative for palpitations, orthopnea and leg swelling.  Gastrointestinal:  Negative for abdominal pain, blood in stool, constipation, diarrhea, heartburn, melena, nausea and vomiting.  Genitourinary:  Negative for dysuria, frequency and urgency.  Musculoskeletal:  Negative for back pain and joint pain.  Skin: Negative.  Negative for itching and rash.  Neurological:  Negative for  dizziness, tingling, focal weakness and weakness.  Endo/Heme/Allergies:  Does not bruise/bleed easily.  Psychiatric/Behavioral:  Negative for  depression. The patient is not nervous/anxious and does not have insomnia.     MEDICAL HISTORY:  Past Medical History:  Diagnosis Date   Barrett's esophagus    Dysrhythmia    stress related at work.   GERD (gastroesophageal reflux disease)    Hepatitis C 2008   Hyperlipidemia    Hypertension    Hypothyroidism    Nephrolithiasis    Non-small cell carcinoma of left lung (HCC)    Osteoporosis    Pulmonary mass    Thyroid cancer ([place]) 2012   Thyroid cancer ([place])     SURGICAL HISTORY: Past Surgical History:  Procedure Laterality Date   ABDOMINAL HYSTERECTOMY  2006   COLONOSCOPY WITH PROPOFOL N/A 04/25/2015   Procedure: COLONOSCOPY WITH PROPOFOL;  Surgeon: Josefine Class, MD;  Location: [facility] ENDOSCOPY;  Service: Endoscopy;  Laterality: N/A;   CYSTOSCOPY W/ RETROGRADES Bilateral 05/19/2015   Procedure: CYSTOSCOPY WITH RETROGRADE PYELOGRAM;  Surgeon: Hollice Espy, MD;  Location: [facility] ORS;  Service: Urology;  Laterality: Bilateral;   CYSTOSCOPY W/ URETERAL STENT PLACEMENT Right 05/26/2015   Procedure: CYSTOSCOPY WITH STENT REPLACEMENT;  Surgeon: Hollice Espy, MD;  Location: [facility] ORS;  Service: Urology;  Laterality: Right;   CYSTOSCOPY WITH STENT PLACEMENT Right 05/19/2015   Procedure: CYSTOSCOPY WITH STENT PLACEMENT;  Surgeon: Hollice Espy, MD;  Location: [facility] ORS;  Service: Urology;  Laterality: Right;   CYSTOSCOPY WITH STENT PLACEMENT Right 05/26/2015   Procedure: CYSTOSCOPY WITH STENT PLACEMENT;  Surgeon: Hollice Espy, MD;  Location: [facility] ORS;  Service: Urology;  Laterality: Right;   CYSTOSCOPY WITH STENT PLACEMENT Right 06/25/2016   Procedure: CYSTOSCOPY WITH STENT PLACEMENT;  Surgeon: Nickie Retort, MD;  Location: [facility] ORS;  Service: Urology;  Laterality: Right;   CYSTOSCOPY WITH STENT PLACEMENT Left 10/31/2017   Procedure: CYSTOSCOPY WITH STENT PLACEMENT;  Surgeon: Hollice Espy, MD;  Location: [facility] ORS;  Service: Urology;  Laterality: Left;    CYSTOSCOPY/URETEROSCOPY/HOLMIUM LASER/STENT PLACEMENT Left 07/23/2015   Procedure: CYSTOSCOPY/URETEROSCOPY/HOLMIUM LASER/STENT PLACEMENT/RETROGRADE PYELOGRAM;  Surgeon: Hollice Espy, MD;  Location: [facility] ORS;  Service: Urology;  Laterality: Left;   CYSTOSCOPY/URETEROSCOPY/HOLMIUM LASER/STENT PLACEMENT Left 11/16/2017   Procedure: CYSTOSCOPY/URETEROSCOPY/HOLMIUM LASER/STENT Exchange;  Surgeon: Hollice Espy, MD;  Location: [facility] ORS;  Service: Urology;  Laterality: Left;   ESOPHAGOGASTRODUODENOSCOPY (EGD) WITH PROPOFOL N/A 04/25/2015   Procedure: ESOPHAGOGASTRODUODENOSCOPY (EGD) WITH PROPOFOL;  Surgeon: Josefine Class, MD;  Location: [facility] ENDOSCOPY;  Service: Endoscopy;  Laterality: N/A;   EYE SURGERY Bilateral 1964   lazy eye repair   EYE SURGERY Bilateral 2001   lazy eye repair   IR IMAGING GUIDED PORT INSERTION  12/08/2020   LITHOTRIPSY  2009   THYROIDECTOMY  2010   URETEROSCOPY WITH HOLMIUM LASER LITHOTRIPSY Right 05/19/2015   Procedure: URETEROSCOPY WITH HOLMIUM LASER LITHOTRIPSY;  Surgeon: Hollice Espy, MD;  Location: [facility] ORS;  Service: Urology;  Laterality: Right;   URETEROSCOPY WITH HOLMIUM LASER LITHOTRIPSY Right 05/26/2015   Procedure: URETEROSCOPY WITH HOLMIUM LASER LITHOTRIPSY;  Surgeon: Hollice Espy, MD;  Location: [facility] ORS;  Service: Urology;  Laterality: Right;   URETEROSCOPY WITH HOLMIUM LASER LITHOTRIPSY Right 06/25/2016   Procedure: URETEROSCOPY WITH HOLMIUM LASER LITHOTRIPSY;  Surgeon: Nickie Retort, MD;  Location: [facility] ORS;  Service: Urology;  Laterality: Right;    SOCIAL HISTORY: Social History   Socioeconomic History   Marital status: Divorced    Spouse name: Not on file   Number of  children: Not on file   Years of education: Not on file   Highest education level: Not on file  Occupational History   Not on file  Tobacco Use   Smoking status: Every Day    Packs/day: 1.00    Years: 40.00    Pack years: 40.00    Types: Cigarettes   Smokeless tobacco:  Never  Vaping Use   Vaping Use: Every day  Substance and Sexual Activity   Alcohol use: Not Currently    Comment: socially   Drug use: No   Sexual activity: Not Currently    Birth control/protection: None  Other Topics Concern   Not on file  Social History Narrative   Smoking: 1p 1-2 days since 17 years; alcohol: rare; 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     Social Determinants of Health   Financial Resource Strain: Not on file  Food Insecurity: Not on file  Transportation Needs: Not on file  Physical Activity: Not on file  Stress: Not on file  Social Connections: Not on file  Intimate Partner Violence: Not on file    FAMILY HISTORY: Family History  Problem Relation Age of Onset   CAD Mother    Heart disease Mother    Dementia Father    Bladder Cancer Neg Hx    Prostate cancer Neg Hx    Kidney cancer Neg Hx    Breast cancer Neg Hx     ALLERGIES:  has No Known Allergies.  MEDICATIONS:  Current Outpatient Medications  Medication Sig Dispense Refill   Accu-Chek Softclix Lancets lancets Check glucose once a day 100 each 12   albuterol (VENTOLIN HFA) 108 (90 Base) MCG/ACT inhaler Inhale 2 puffs into the lungs every 6 (six) hours as needed for wheezing or shortness of breath. 8 g 2   alendronate (FOSAMAX) 70 MG tablet One tab every Sunday on empty stomach with a full glass of water. Do not lie down or eat/drink anything else for the next 30 min.     amLODipine (NORVASC) 5 MG tablet Take 5 mg by mouth every morning.      atorvastatin (LIPITOR) 80 MG tablet Take 80 mg by mouth daily.     blood glucose meter kit and supplies KIT Dispense based on patient and insurance preference. Use up to three times daily as directed. 1 each 0   cholecalciferol (VITAMIN D) 1000 units tablet Take 2,000 Units by mouth daily.     Cranberry-Vitamin C-Probiotic (AZO CRANBERRY PO) Take 1 tablet by mouth daily.      dexamethasone (DECADRON) 4 MG tablet Take 1 tablet by mouth twice a day the day before chemotherapy 30 tablet 0   fluticasone-salmeterol (ADVAIR HFA) 45-21 MCG/ACT inhaler Inhale 2 puffs into the lungs 2 (two) times daily. 1 each 12   folic acid (FOLVITE) 1 MG tablet Take 1 tablet (1 mg total) by mouth daily. 90 tablet 1   glipiZIDE (GLUCOTROL) 5 MG tablet Take 1 tablet (5 mg total) by mouth 2 (two) times daily before a meal. 180 tablet 1   HYDROcodone-acetaminophen (NORCO/VICODIN) 5-325 MG tablet Take 1 tablet by mouth every 8 (eight) hours as needed for moderate pain. 60 tablet 0   levothyroxine (SYNTHROID) 175 MCG tablet Sat and Sun ON AN EMPTY STOMACH WITH A GLASS OF WATER AT LEAST 30-60 MINUTES BEFORE BREAKFAST     levothyroxine (SYNTHROID, LEVOTHROID) 150 MCG tablet Take 150 mcg by mouth at bedtime.  lidocaine (LIDODERM) 5 % 1 patch daily.     lidocaine-prilocaine (EMLA) cream Apply 1 application topically as needed. 30 g 0   losartan (COZAAR) 100 MG tablet Take 100 mg by mouth every morning.      metoprolol succinate (TOPROL-XL) 25 MG 24 hr tablet Take 25 mg by mouth every morning.   1   nystatin (MYCOSTATIN) 100000 UNIT/ML suspension Swish and swallow. Don't drink fluids immediately following administration. 400 mL 0   omeprazole (PRILOSEC) 20 MG capsule Take 20 mg by mouth daily before breakfast.      ondansetron (ZOFRAN) 8 MG tablet Take 1 tablet (8 mg total) by mouth every 8 (eight) hours as needed for nausea or vomiting. Start on the third day after chemotherapy 30 tablet 2   prochlorperazine (COMPAZINE) 10 MG tablet Take 1 tablet (10 mg total) by mouth every 6 (six) hours as needed for nausea or vomiting. 30 tablet 2   amoxicillin-clavulanate (AUGMENTIN) 875-125 MG tablet Take 1 tablet by mouth 2 (two) times daily. (Patient not taking: No sig reported)     predniSONE (DELTASONE) 10 MG tablet Take 10 mg by mouth daily. (Patient not taking: Reported on 12/31/2020)     traMADol  (ULTRAM) 50 MG tablet Take 1 tablet (50 mg total) by mouth every 8 (eight) hours as needed. (Patient not taking: Reported on 12/31/2020) 60 tablet 0   No current facility-administered medications for this visit.   Facility-Administered Medications Ordered in Other Visits  Medication Dose Route Frequency Provider Last Rate Last Admin   CARBOplatin (PARAPLATIN) 630 mg in sodium chloride 0.9 % 250 mL chemo infusion  630 mg Intravenous Once Cammie Sickle, MD 626 mL/hr at 02/11/21 1218 630 mg at 02/11/21 1218   heparin lock flush 100 unit/mL  500 Units Intracatheter Once PRN Cammie Sickle, MD          .  PHYSICAL EXAMINATION: ECOG PERFORMANCE STATUS: 1 - Symptomatic but completely ambulatory  Vitals:   02/11/21 0926  BP: 137/72  Pulse: 70  Temp: 98.1 F (36.7 C)  SpO2: 99%    Filed Weights   02/11/21 0926  Weight: 190 lb (86.2 kg)     Physical Exam Vitals and nursing note reviewed.  HENT:     Head: Normocephalic and atraumatic.     Mouth/Throat:     Pharynx: Oropharynx is clear.  Eyes:     Extraocular Movements: Extraocular movements intact.     Pupils: Pupils are equal, round, and reactive to light.  Cardiovascular:     Rate and Rhythm: Normal rate and regular rhythm.  Pulmonary:     Comments: Decreased breath sounds bilaterally.  Abdominal:     Palpations: Abdomen is soft.  Musculoskeletal:        General: Normal range of motion.     Cervical back: Normal range of motion.  Skin:    General: Skin is warm.  Neurological:     General: No focal deficit present.     Mental Status: She is alert and oriented to person, place, and time.  Psychiatric:        Behavior: Behavior normal.        Judgment: Judgment normal.     LABORATORY DATA:  I have reviewed the data as listed Lab Results  Component Value Date   WBC 9.7 02/11/2021   HGB 11.0 (L) 02/11/2021   HCT 34.8 (L) 02/11/2021   MCV 96.9 02/11/2021   PLT 215 02/11/2021   Recent Labs  12/31/20 0835 01/21/21 0833 02/11/21 0858  NA 140 142 138  K 3.6 3.8 3.5  CL 103 107 104  CO2 _0 GLUCOSE 159* 141* 144*  BUN _1 CREATININE 0.68 0.63 0.76  CALCIUM 8.9 8.2* 8.7*  GFRNONAA >60 >60 >60  PROT 7.4 6.7 7.1  ALBUMIN 3.7 3.6 3.7  AST _2 ALT _3 ALKPHOS 71 65 62  BILITOT 0.2* 0.3 0.3    RADIOGRAPHIC STUDIES: I have personally reviewed the radiological images as listed and agreed with the findings in the report. CT CHEST W CONTRAST  Result Date: 01/18/2021 CLINICAL DATA:  A 65 year old female presents for evaluation of non-small cell lung cancer, follow-up assessment. EXAM: CT CHEST WITH CONTRAST TECHNIQUE: Multidetector CT imaging of the chest was performed during intravenous contrast administration. CONTRAST:  8m OMNIPAQUE IOHEXOL 300 MG/ML  SOLN COMPARISON:  Comparison is made with June 06, 2019. 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 Calcified and noncalcified atheromatous plaque of the thoracic aorta without aneurysmal dilation. Normal heart size without substantial pericardial effusion. Normal caliber of the central pulmonary vessels. RIGHT-sided Port-A-Cath terminates in the distal superior vena cava. Mediastinum/Nodes: Post thyroidectomy. Diminished size of subcarinal lymphadenopathy since prior imaging currently approximately 13-14 mm, previously as much as 18 mm greatest size. Stable appearance of LEFT juxta hilar nodal tissue, approximately 1 cm (image 83/2) Scattered small lymph nodes elsewhere throughout the chest. Lungs/Pleura: Dominant nodule in the LEFT lower lobe (image 128/3) 2.2 x 1.8 cm as compared to 2.3 x 1.8 cm on the previous exam. Pleural nodularity at the LEFT lung base dominant area (image 109/3) approximally 5-6 mm greatest thickness similar to the prior study as is a smaller area of pleural base nodularity in the medial RIGHT lung base  (image 125/3) approximately 3-4 mm. Nodular areas in the posterior costodiaphragmatic sulcus similarly stable compared to previous imaging in the LEFT chest. Numerous areas of ground-glass nodularity throughout the chest without substantial change. Small LEFT upper lobe pulmonary nodule (image 92/3) 5 mm, unchanged. Most discrete and well-defined ground-glass nodule in the chest in the RIGHT lower lobe (image 92/3) within 1 mm of prior measurements at 11 x 10 mm. Airways are patent. No consolidation or pleural effusion. Upper Abdomen: Hepatic steatosis. Stable low-attenuation in the RIGHT hepatic lobe. Lobular hepatic contours. No acute findings relative to visualized pancreas, spleen, adrenal glands or kidneys. No acute findings related to the visualized gastrointestinal tract. Small hiatal hernia. Musculoskeletal: No acute bone finding. Subtle periosteal changes about pleural and potential chest wall involvement along the LEFT lateral chest affecting the LEFT lateral ninth rib, these periosteal changes are new. Spinal degenerative changes. IMPRESSION: Similar appearance of metastatic bronchogenic neoplasm with signs of pleural involvement. Perhaps mild decreased size of dominant lymph node in the chest in the subcarinal region. Numerous areas of ground-glass nodularity throughout the chest without substantial change this remains concerning for multifocal bronchogenic neoplasm, attention on follow-up. New periosteal reaction about the LEFT lateral ninth rib (image 140/2) this likely reflects early rib involvement from pleural disease now extending into the chest wall, attention on follow-up. Hepatic steatosis. Aortic atherosclerosis. Aortic Atherosclerosis (ICD10-I70.0). Electronically Signed   By: GZetta BillsM.D.   On: 01/18/2021 21:57   MR BRAIN W WO CONTRAST  Result Date: 02/04/2021 CLINICAL DATA:  headaches/dizzyness/cancer EXAM: MRI HEAD WITHOUT AND WITH CONTRAST TECHNIQUE: Multiplanar, multiecho  pulse sequences of  the brain and surrounding structures were obtained without and with intravenous contrast. CONTRAST:  16m GADAVIST GADOBUTROL 1 MMOL/ML IV SOLN COMPARISON:  MRI November 16, 2020. FINDINGS: Brain: No evidence of acute infarct. No hydrocephalus, abnormal mass effect, acute hemorrhage, or extra-axial fluid collection. Mild patchy T2/FLAIR hyperintensities in the white matter, nonspecific but compatible with chronic microvascular ischemic disease. No abnormal enhancement. Previously seen area of enhancement in the right cerebellum on the prior is no longer appreciable and likely was artifactual. Similar asymmetric atrophy and T2/FLAIR hyperintensity of the left hippocampus (for example series 15, image 20). Vascular: Major arterial flow voids are maintained at the skull base. Skull and upper cervical spine: Normal marrow signal. Sinuses/Orbits: Mild paranasal sinus mucosal thickening. Other: No mastoid effusions. IMPRESSION: 1. No evidence of acute intracranial abnormality or metastatic disease. Previously seen enhancement in the right cerebellum on the prior is no longer appreciable and likely was artifactual. 2. Similar asymmetric left hippocampal volume loss and T2/FLAIR hyperintense signal. As discussed on prior, this could be secondary to mesial temporal sclerosis if the patient has a history of seizures. Alternatively, this may reflect the sequela of nonspecific remote insult. Electronically Signed   By: FMargaretha SheffieldM.D.   On: 02/04/2021 10:12    ASSESSMENT & PLAN:   [place] lobe of [place] lung [place] #Stage IV -non-small cell favor adenocarcinoma; on carbo-alita-keytruda #2- DEC 2nd, 2022- September 2022 CT scan- Similar appearance of metastatic bronchogenic neoplasm with signs of pleural involvement; stable bilateral multifocal groundglass opacities; New periosteal reaction about the LEFT lateral ninth rib-likely sclerosis from ongoing therapy.  #Proceed with cHollie Salk Keytruda #4 today Labs today reviewed;  acceptable for treatment today.  We will continue Alimta-Keytruda maintenance.  Also get imaging at next visit.  #October 2022 MRI brain-punctate lesion-asymptomatic [D/w- Dr.Vaslow]; December 2022 MRI negative for any acute process; chronic sclerosis of hippocampus-clinically insignificant.  # Left chest wall pain:-improved/resolved-using hydrocodone prn.   #COPD [Dr.A]: continue adviar/ abulterol prn. STABLE.     #Diabetes-A1c 6.9 [AUG 2022]-preprandial blood sugars mostly 144.  Continue glipizide 5 mg BID.   # Diarrhea-G-1-on immoiudm/ Oral mucositis- G-1- STABLE.   # MSK- bil hip joint pain/weakness- ? STOP fosomax; ST[facility] dex [facility]. [facility]- STABLE.   #DISPOSITION: # refer to MGwenette Greetre: thigh muscle weakness/fals # Chemo today # 3 week- MD- labs- cbc/cmp; TSH- Alimta-keytruda; Dr.B          All questions were answered. The patient knows to call the clinic with any problems, questions or concerns.       GCammie Sickle MD 02/11/2021 12:23 PM

## 2021-02-11 NOTE — Assessment & Plan Note (Addendum)
#  Stage IV -non-small cell favor adenocarcinoma; on carbo-alita-keytruda #2- DEC 2nd, 2022- September 2022 CT scan- Similar appearance of metastatic bronchogenic neoplasm with signs of pleural involvement; stable bilateral multifocal groundglass opacities; New periosteal reaction about the LEFT lateral ninth rib-likely sclerosis from ongoing therapy.  #Proceed with Hollie Salk Keytruda #4 today Labs today reviewed;  acceptable for treatment today.  We will continue Alimta-Keytruda maintenance.  Also get imaging at next visit.  #October 2022 MRI brain-punctate lesion-asymptomatic [D/w- Dr.Vaslow]; December 2022 MRI negative for any acute process; chronic sclerosis of hippocampus-clinically insignificant.  # Left chest wall pain:-improved/resolved-using hydrocodone prn.   #COPD [Dr.A]: continue adviar/ abulterol prn. STABLE.     #Diabetes-A1c 6.9 [AUG 2022]-preprandial blood sugars mostly 144.  Continue glipizide 5 mg BID.   # Diarrhea-G-1-on immoiudm/ Oral mucositis- G-1- STABLE.   # MSK- bil hip joint pain/weakness- ? STOP fosomax; STOP dex pre-med. Refer to [facility]  # Mediport placement- STABLE.   #DISPOSITION: # refer to Gwenette Greet re: thigh muscle weakness/fals # Chemo today # 3 week- MD- labs- cbc/cmp; TSH- Alimta-keytruda; Dr.B

## 2021-02-11 NOTE — Progress Notes (Signed)
Nutrition Follow-up:  Patient with lung cancer, stage IV.  Patient receiving carbo/alimta, [name].    Met with patient during infusion.  Patient with good appetite.  Has been including protein foods with meals.  Blood glucose running in 140s.  Sometimes has issues with diarrhea but imodium helps.  Says that she tries not to wear dentures for few days after chemo and it has helped with mouth sores.  Nystatin also has helped with mouth sores.      Medications: reviewed  Labs: reviewed  Anthropometrics:   Weight 190 lb today  191 lb 1.6 oz on 12/7 189 lb 11/16 195 lb 9.6 oz on 10/26   NUTRITION DIAGNOSIS: Food and beverage related knowledge deficit improved    INTERVENTION:  Continue protein food at every meal Continue check on blood glucose.      MONITORING, EVALUATION, GOAL: weight trends, intake   NEXT VISIT: Wednesday, Jan 18 during infusion  Felicia Manning B. Zenia Resides, [place], [place] Registered Dietitian [phone] (mobile)

## 2021-02-19 ENCOUNTER — Encounter: Payer: Self-pay | Admitting: *Deleted

## 2021-02-19 ENCOUNTER — Other Ambulatory Visit: Payer: Self-pay | Admitting: *Deleted

## 2021-02-26 ENCOUNTER — Other Ambulatory Visit: Payer: Self-pay

## 2021-02-26 ENCOUNTER — Inpatient Hospital Stay: Payer: 59 | Attending: Hospice and Palliative Medicine | Admitting: Hospice and Palliative Medicine

## 2021-02-26 DIAGNOSIS — G893 Neoplasm related pain (acute) (chronic): Secondary | ICD-10-CM

## 2021-02-26 DIAGNOSIS — E119 Type 2 diabetes mellitus without complications: Secondary | ICD-10-CM | POA: Insufficient documentation

## 2021-02-26 DIAGNOSIS — Z79899 Other long term (current) drug therapy: Secondary | ICD-10-CM | POA: Insufficient documentation

## 2021-02-26 DIAGNOSIS — C3432 Malignant neoplasm of lower lobe, left bronchus or lung: Secondary | ICD-10-CM | POA: Insufficient documentation

## 2021-02-26 DIAGNOSIS — Z5111 Encounter for antineoplastic chemotherapy: Secondary | ICD-10-CM | POA: Insufficient documentation

## 2021-02-26 DIAGNOSIS — R1011 Right upper quadrant pain: Secondary | ICD-10-CM | POA: Insufficient documentation

## 2021-02-26 DIAGNOSIS — J449 Chronic obstructive pulmonary disease, unspecified: Secondary | ICD-10-CM | POA: Insufficient documentation

## 2021-02-26 DIAGNOSIS — R0789 Other chest pain: Secondary | ICD-10-CM | POA: Insufficient documentation

## 2021-02-26 DIAGNOSIS — R531 Weakness: Secondary | ICD-10-CM

## 2021-02-26 MED ORDER — HYDROCODONE-ACETAMINOPHEN 5-325 MG PO TABS: 1.0000 | ORAL_TABLET | Freq: Three times a day (TID) | ORAL | 0 refills | Status: DC | PRN

## 2021-02-26 NOTE — Progress Notes (Signed)
Virtual Visit via Telephone Note  I connected with Felicia Manning on 02/26/21 at  1:30 PM EST by telephone and verified that I am speaking with the correct person using two identifiers.  Location: Patient: Home Provider: Clinic   I discussed the limitations, risks, security and privacy concerns of performing an evaluation and management service by telephone and the availability of in person appointments. I also discussed with the patient that there may be a patient responsible charge related to this service. The patient expressed understanding and agreed to proceed.   History of Present Illness: Felicia Manning is a 66 y.o. female with multiple medical problems including including COPD, diabetes, and stage IV non-small cell lung cancer, initially diagnosed October 2022, on chemotherapy/immunotherapy.  Patient was referred to palliative care to discuss goals and provide ongoing support for symptoms.   Observations/Objective: Patient reports that she is doing reasonably well. She denies significant changes or concerns today. No significant symptomatic complaints at present. She does continue to have shortness of breath/hip pain with exertion. She is using handicap placcard and has been referred to [facility] for evaluation. She does request walker so will place order.   Patient is sparingly using Norco for occasional pain. Last refilled in November. Patient requests refill today.   She has upcoming trip planned to [place] to visit daughter. Her granndaughter will be born soon. Patient's mother is also celebrating 83rd birthday in [place], [place].   Patient is still trying to complete ACP documents. Her bank would not notarize/witness the living will. I suggested that she try to bring those in during a treatment day and we can try to get volunteers/chaplain to assist.   Assessment and Plan: Stage IV NSCLC - on treatment with carbo/pemetrexed/Keytruda.   Weakness - pending eval with Ma[facility]. Order  walker with seat  Neoplasm related pain - refill Norco #60. PDMP reviewed.   Follow Up Instructions: Telephone visit 1-2 months   I discussed the assessment and treatment plan with the patient. The patient was provided an opportunity to ask questions and all were answered. The patient agreed with the plan and demonstrated an understanding of the instructions.   The patient was advised to call back or seek an in-person evaluation if the symptoms worsen or if the condition fails to improve as anticipated.  I provided 10 minutes of non-face-to-face time during this encounter.   Irean Hong, NP

## 2021-03-04 ENCOUNTER — Other Ambulatory Visit: Payer: Self-pay

## 2021-03-04 ENCOUNTER — Inpatient Hospital Stay: Payer: 59

## 2021-03-04 ENCOUNTER — Inpatient Hospital Stay (HOSPITAL_BASED_OUTPATIENT_CLINIC_OR_DEPARTMENT_OTHER): Payer: 59 | Admitting: Internal Medicine

## 2021-03-04 ENCOUNTER — Encounter: Payer: Self-pay | Admitting: Internal Medicine

## 2021-03-04 ENCOUNTER — Inpatient Hospital Stay: Payer: 59 | Admitting: Occupational Therapy

## 2021-03-04 VITALS — BP 161/88 | HR 89 | Temp 98.2°F | Resp 16 | Wt 190.8 lb

## 2021-03-04 DIAGNOSIS — Z95828 Presence of other vascular implants and grafts: Secondary | ICD-10-CM | POA: Diagnosis not present

## 2021-03-04 DIAGNOSIS — R531 Weakness: Secondary | ICD-10-CM

## 2021-03-04 DIAGNOSIS — C3432 Malignant neoplasm of lower lobe, left bronchus or lung: Secondary | ICD-10-CM | POA: Diagnosis present

## 2021-03-04 DIAGNOSIS — J449 Chronic obstructive pulmonary disease, unspecified: Secondary | ICD-10-CM | POA: Diagnosis not present

## 2021-03-04 DIAGNOSIS — E119 Type 2 diabetes mellitus without complications: Secondary | ICD-10-CM | POA: Diagnosis not present

## 2021-03-04 DIAGNOSIS — Z5111 Encounter for antineoplastic chemotherapy: Secondary | ICD-10-CM | POA: Diagnosis present

## 2021-03-04 DIAGNOSIS — R1011 Right upper quadrant pain: Secondary | ICD-10-CM | POA: Diagnosis not present

## 2021-03-04 DIAGNOSIS — R0789 Other chest pain: Secondary | ICD-10-CM | POA: Diagnosis not present

## 2021-03-04 DIAGNOSIS — Z79899 Other long term (current) drug therapy: Secondary | ICD-10-CM | POA: Diagnosis not present

## 2021-03-04 LAB — CBC WITH DIFFERENTIAL/PLATELET
Abs Immature Granulocytes: 0.03 10*3/uL (ref 0.00–0.07)
Basophils Absolute: 0 10*3/uL (ref 0.0–0.1)
Basophils Relative: 1 %
Eosinophils Absolute: 0.1 10*3/uL (ref 0.0–0.5)
Eosinophils Relative: 2 %
HCT: 32.6 % — ABNORMAL LOW (ref 36.0–46.0)
Hemoglobin: 10.3 g/dL — ABNORMAL LOW (ref 12.0–15.0)
Immature Granulocytes: 1 %
Lymphocytes Relative: 30 %
Lymphs Abs: 1.7 10*3/uL (ref 0.7–4.0)
MCH: 32.1 pg (ref 26.0–34.0)
MCHC: 31.6 g/dL (ref 30.0–36.0)
MCV: 101.6 fL — ABNORMAL HIGH (ref 80.0–100.0)
Monocytes Absolute: 0.8 10*3/uL (ref 0.1–1.0)
Monocytes Relative: 14 %
Neutro Abs: 2.9 10*3/uL (ref 1.7–7.7)
Neutrophils Relative %: 52 %
Platelets: 152 10*3/uL (ref 150–400)
RBC: 3.21 MIL/uL — ABNORMAL LOW (ref 3.87–5.11)
RDW: 21.2 % — ABNORMAL HIGH (ref 11.5–15.5)
WBC: 5.5 10*3/uL (ref 4.0–10.5)
nRBC: 0 % (ref 0.0–0.2)

## 2021-03-04 LAB — COMPREHENSIVE METABOLIC PANEL
ALT: 23 U/L (ref 0–44)
AST: 21 U/L (ref 15–41)
Albumin: 3.8 g/dL (ref 3.5–5.0)
Alkaline Phosphatase: 71 U/L (ref 38–126)
Anion gap: 10 (ref 5–15)
BUN: 12 mg/dL (ref 8–23)
CO2: 26 mmol/L (ref 22–32)
Calcium: 8.7 mg/dL — ABNORMAL LOW (ref 8.9–10.3)
Chloride: 105 mmol/L (ref 98–111)
Creatinine, Ser: 0.62 mg/dL (ref 0.44–1.00)
GFR, Estimated: >60 mL/min (ref >60)
Glucose, Bld: 125 mg/dL — ABNORMAL HIGH (ref 70–99)
Potassium: 4 mmol/L (ref 3.5–5.1)
Sodium: 141 mmol/L (ref 135–145)
Total Bilirubin: 0.5 mg/dL (ref 0.3–1.2)
Total Protein: 7.1 g/dL (ref 6.5–8.1)

## 2021-03-04 LAB — TSH: TSH: 1.421 u[IU]/mL (ref 0.350–4.500)

## 2021-03-04 MED ORDER — SODIUM CHLORIDE 0.9 % IV SOLN
200.0000 mg | Freq: Once | INTRAVENOUS | Status: AC
  Administered 2021-03-04: 200 mg via INTRAVENOUS
  Filled 2021-03-04: qty 200

## 2021-03-04 MED ORDER — SODIUM CHLORIDE 0.9 % IV SOLN
500.0000 mg/m2 | Freq: Once | INTRAVENOUS | Status: AC
  Administered 2021-03-04: 1000 mg via INTRAVENOUS
  Filled 2021-03-04: qty 40

## 2021-03-04 MED ORDER — SODIUM CHLORIDE 0.9 % IV SOLN
Freq: Once | INTRAVENOUS | Status: AC
  Filled 2021-03-04: qty 250

## 2021-03-04 MED ORDER — ALTEPLASE 2 MG IJ SOLR
2.0000 mg | Freq: Once | INTRAMUSCULAR | Status: AC | PRN
  Administered 2021-03-04: 2 mg
  Filled 2021-03-04: qty 2

## 2021-03-04 MED ORDER — HEPARIN SOD (PORK) LOCK FLUSH 100 UNIT/ML IV SOLN
INTRAVENOUS | Status: AC
  Filled 2021-03-04: qty 5

## 2021-03-04 MED ORDER — PROCHLORPERAZINE MALEATE 10 MG PO TABS
10.0000 mg | ORAL_TABLET | Freq: Once | ORAL | Status: AC
  Administered 2021-03-04: 10 mg via ORAL
  Filled 2021-03-04: qty 1

## 2021-03-04 MED ORDER — HEPARIN SOD (PORK) LOCK FLUSH 100 UNIT/ML IV SOLN
500.0000 [IU] | Freq: Once | INTRAVENOUS | Status: AC | PRN
  Administered 2021-03-04: 500 [IU]
  Filled 2021-03-04: qty 5

## 2021-03-04 NOTE — Progress Notes (Signed)
Patient had 2 episodes of new right side sharp pain.  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  Went to eye doctor and has been referred to retinal specialists on 2/10 for evaluation of "scar tissue" seen on right eye.

## 2021-03-04 NOTE — Patient Instructions (Signed)
[facility] [place]  Discharge Instructions: Thank you for choosing [facility] to provide your oncology and hematology care.   If you have a lab appointment with the [facility], please go directly to the [facility] and check in at the registration area.   Wear comfortable clothing and clothing appropriate for easy access to any Portacath or PICC line.   We strive to give you quality time with your provider. You may need to reschedule your appointment if you arrive late (15 or more minutes).  Arriving late affects you and other patients whose appointments are after yours.  Also, if you miss three or more appointments without notifying the office, you may be dismissed from the clinic at the providers discretion.      For prescription refill requests, have your pharmacy contact our office and allow 72 hours for refills to be completed.    Today you received the following chemotherapy and/or immunotherapy agents keytruda, alimta      To help prevent nausea and vomiting after your treatment, we encourage you to take your nausea medication as directed.  BELOW ARE SYMPTOMS THAT SHOULD BE REPORTED IMMEDIATELY: *FEVER GREATER THAN 100.4 F (38 C) OR HIGHER *CHILLS OR SWEATING *NAUSEA AND VOMITING THAT IS NOT CONTROLLED WITH YOUR NAUSEA MEDICATION *UNUSUAL SHORTNESS OF BREATH *UNUSUAL BRUISING OR BLEEDING *URINARY PROBLEMS (pain or burning when urinating, or frequent urination) *BOWEL PROBLEMS (unusual diarrhea, constipation, pain near the anus) TENDERNESS IN MOUTH AND THROAT WITH OR WITHOUT PRESENCE OF ULCERS (sore throat, sores in mouth, or a toothache) UNUSUAL RASH, SWELLING OR PAIN  UNUSUAL VAGINAL DISCHARGE OR ITCHING   Items with * indicate a potential emergency and should be followed up as soon as possible or go to the Emergency Department if any problems should occur.  Please show the CHEMOTHERAPY ALERT CARD or IMMUNOTHERAPY ALERT CARD at  check-in to the Emergency Department and triage nurse.  Should you have questions after your visit or need to cancel or reschedule your appointment, please contact [facility]  Dept: [phone]  and follow the prompts.  Office hours are 8:00 a.m. to 4:30 p.m. Monday - Friday. Please note that voicemails left after 4:00 p.m. may not be returned until the following business day.  We are closed weekends and major holidays. You have access to a nurse at all times for urgent questions. Please call the main number to the clinic Dept: [phone] and follow the prompts.   For any non-urgent questions, you may also contact your provider using MyChart. We now offer e-Visits for anyone 32 and older to request care online for non-urgent symptoms. For details visit [email or web address].   Also download the MyChart app! Go to the app store, search "MyChart", open the app, select [facility], and log in with your MyChart username and password.  Due to Covid, a mask is required upon entering the hospital/clinic. If you do not have a mask, one will be given to you upon arrival. For doctor visits, patients may have 1 support person aged 24 or older with them. For treatment visits, patients cannot have anyone with them due to current Covid guidelines and our immunocompromised population.   Pemetrexed injection What is this medication? PEMETREXED (PEM e TREX ed) is a chemotherapy drug used to treat lung cancers like non-small cell lung cancer and mesothelioma. It may also be used to treat other cancers. This medicine may be used for other purposes; ask your health care  provider or pharmacist if you have questions. COMMON BRAND NAME(S): Alimta, PEMFEXY What should I tell my care team before I take this medication? They need to know if you have any of these conditions: infection (especially a virus infection such as chickenpox, cold sores, or herpes) kidney disease low blood  counts, like low white cell, platelet, or red cell counts lung or breathing disease, like asthma radiation therapy an unusual or allergic reaction to pemetrexed, other medicines, foods, dyes, or preservative pregnant or trying to get pregnant breast-feeding How should I use this medication? This drug is given as an infusion into a vein. It is administered in a hospital or clinic by a specially trained health care professional. Talk to your pediatrician regarding the use of this medicine in children. Special care may be needed. Overdosage: If you think you have taken too much of this medicine contact a poison control center or emergency room at once. NOTE: This medicine is only for you. Do not share this medicine with others. What if I miss a dose? It is important not to miss your dose. Call your doctor or health care professional if you are unable to keep an appointment. What may interact with this medication? This medicine may interact with the following medications: Ibuprofen This list may not describe all possible interactions. Give your health care provider a list of all the medicines, herbs, non-prescription drugs, or dietary supplements you use. Also tell them if you smoke, drink alcohol, or use illegal drugs. Some items may interact with your medicine. What should I watch for while using this medication? Visit your doctor for checks on your progress. This drug may make you feel generally unwell. This is not uncommon, as chemotherapy can affect healthy cells as well as cancer cells. Report any side effects. Continue your course of treatment even though you feel ill unless your doctor tells you to stop. In some cases, you may be given additional medicines to help with side effects. Follow all directions for their use. Call your doctor or health care professional for advice if you get a fever, chills or sore throat, or other symptoms of a cold or flu. Do not treat yourself. This drug  decreases your body's ability to fight infections. Try to avoid being around people who are sick. This medicine may increase your risk to bruise or bleed. Call your doctor or health care professional if you notice any unusual bleeding. Be careful brushing and flossing your teeth or using a toothpick because you may get an infection or bleed more easily. If you have any dental work done, tell your dentist you are receiving this medicine. Avoid taking products that contain aspirin, acetaminophen, ibuprofen, naproxen, or ketoprofen unless instructed by your doctor. These medicines may hide a fever. Call your doctor or health care professional if you get diarrhea or mouth sores. Do not treat yourself. To protect your kidneys, drink water or other fluids as directed while you are taking this medicine. Do not become pregnant while taking this medicine or for 6 months after stopping it. Women should inform their doctor if they wish to become pregnant or think they might be pregnant. Men should not father a child while taking this medicine and for 3 months after stopping it. This may interfere with the ability to father a child. You should talk to your doctor or health care professional if you are concerned about your fertility. There is a potential for serious side effects to an unborn child. Talk to  your health care professional or pharmacist for more information. Do not breast-feed an infant while taking this medicine or for 1 week after stopping it. What side effects may I notice from receiving this medication? Side effects that you should report to your doctor or health care professional as soon as possible: allergic reactions like skin rash, itching or hives, swelling of the face, lips, or tongue breathing problems redness, blistering, peeling or loosening of the skin, including inside the mouth signs and symptoms of bleeding such as bloody or black, tarry stools; red or dark-brown urine; spitting up blood  or brown material that looks like coffee grounds; red spots on the skin; unusual bruising or bleeding from the eye, gums, or nose signs and symptoms of infection like fever or chills; cough; sore throat; pain or trouble passing urine signs and symptoms of kidney injury like trouble passing urine or change in the amount of urine signs and symptoms of liver injury like dark yellow or brown urine; general ill feeling or flu-like symptoms; light-colored stools; loss of appetite; nausea; right upper belly pain; unusually weak or tired; yellowing of the eyes or skin Side effects that usually do not require medical attention (report to your doctor or health care professional if they continue or are bothersome): constipation mouth sores nausea, vomiting unusually weak or tired This list may not describe all possible side effects. Call your doctor for medical advice about side effects. You may report side effects to FDA at 1-800-FDA-1088. Where should I keep my medication? This drug is given in a hospital or clinic and will not be stored at home. NOTE: This sheet is a summary. It may not cover all possible information. If you have questions about this medicine, talk to your doctor, pharmacist, or health care provider.  2022 Elsevier/Gold Standard (2017-03-29 00:00:00)   Pembrolizumab injection What is this medication? PEMBROLIZUMAB (pem broe liz ue mab) is a monoclonal antibody. It is used to treat certain types of cancer. This medicine may be used for other purposes; ask your health care provider or pharmacist if you have questions. COMMON BRAND NAME(S): Keytruda What should I tell my care team before I take this medication? They need to know if you have any of these conditions: autoimmune diseases like Crohn's disease, ulcerative colitis, or lupus have had or planning to have an allogeneic stem cell transplant (uses someone else's stem cells) history of organ transplant history of chest  radiation nervous system problems like myasthenia gravis or Guillain-Barre syndrome an unusual or allergic reaction to pembrolizumab, other medicines, foods, dyes, or preservatives pregnant or trying to get pregnant breast-feeding How should I use this medication? This medicine is for infusion into a vein. It is given by a health care professional in a hospital or clinic setting. A special MedGuide will be given to you before each treatment. Be sure to read this information carefully each time. Talk to your pediatrician regarding the use of this medicine in children. While this drug may be prescribed for children as young as 6 months for selected conditions, precautions do apply. Overdosage: If you think you have taken too much of this medicine contact a poison control center or emergency room at once. NOTE: This medicine is only for you. Do not share this medicine with others. What if I miss a dose? It is important not to miss your dose. Call your doctor or health care professional if you are unable to keep an appointment. What may interact with this medication? Interactions have  not been studied. This list may not describe all possible interactions. Give your health care provider a list of all the medicines, herbs, non-prescription drugs, or dietary supplements you use. Also tell them if you smoke, drink alcohol, or use illegal drugs. Some items may interact with your medicine. What should I watch for while using this medication? Your condition will be monitored carefully while you are receiving this medicine. You may need blood work done while you are taking this medicine. Do not become pregnant while taking this medicine or for 4 months after stopping it. Women should inform their doctor if they wish to become pregnant or think they might be pregnant. There is a potential for serious side effects to an unborn child. Talk to your health care professional or pharmacist for more information. Do  not breast-feed an infant while taking this medicine or for 4 months after the last dose. What side effects may I notice from receiving this medication? Side effects that you should report to your doctor or health care professional as soon as possible: allergic reactions like skin rash, itching or hives, swelling of the face, lips, or tongue bloody or black, tarry breathing problems changes in vision chest pain chills confusion constipation cough diarrhea dizziness or feeling faint or lightheaded fast or irregular heartbeat fever flushing joint pain low blood counts - this medicine may decrease the number of white blood cells, red blood cells and platelets. You may be at increased risk for infections and bleeding. muscle pain muscle weakness pain, tingling, numbness in the hands or feet persistent headache redness, blistering, peeling or loosening of the skin, including inside the mouth signs and symptoms of high blood sugar such as dizziness; dry mouth; dry skin; fruity breath; nausea; stomach pain; increased hunger or thirst; increased urination signs and symptoms of kidney injury like trouble passing urine or change in the amount of urine signs and symptoms of liver injury like dark urine, light-colored stools, loss of appetite, nausea, right upper belly pain, yellowing of the eyes or skin sweating swollen lymph nodes weight loss Side effects that usually do not require medical attention (report to your doctor or health care professional if they continue or are bothersome): decreased appetite hair loss tiredness This list may not describe all possible side effects. Call your doctor for medical advice about side effects. You may report side effects to FDA at 1-800-FDA-1088. Where should I keep my medication? This drug is given in a hospital or clinic and will not be stored at home. NOTE: This sheet is a summary. It may not cover all possible information. If you have questions  about this medicine, talk to your doctor, pharmacist, or health care provider.  2022 Elsevier/Gold Standard (2020-10-21 00:00:00)

## 2021-03-04 NOTE — Progress Notes (Signed)
[place] CONSULT NOTE  Patient Care Team: Dion Body, MD as PCP - General (Family Medicine) Leata Mouse (Inactive) Byrnett, Forest Gleason, MD (General Surgery) Telford Nab, RN as Oncology Nurse Navigator  CHIEF COMPLAINTS/PURPOSE OF CONSULTATION: lung cancer  Oncology History Overview Note  IMPRESSION: Hypermetabolic solid pulmonary nodule of the left lower lobe, favor primary lung malignancy.   Numerous hypermetabolic nodules of the lower left pleura, compatible with pleural metastatic disease.   Hypermetabolic subcarinal and left hilar lymph nodes, compatible with metastatic disease.   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 Recommend attention on follow-up.   Mild asymmetric FDG uptake below mediastinal blood pool within a small nodule of the left parotid gland, favored to be benign. Recommend attention on follow-up.   No evidence of metastatic disease in the abdomen or pelvis.   No evidence of osseous metastatic disease.   ---------------------  # MARCH 2022- incidental LLL [ 0.9 cm] s/p COVID vaccine; [Dr.Aleskerov]; July 2022- left pleural pain-   #  #October 2022 MRI brain-punctate lesion-asymptomatic [D/w- Dr.Vaslow]; December 2022 MRI negative for any acute process; chronic sclerosis of hippocampus-clinically insignificant.  COPD-non-complaint    ------------------   DIAGNOSIS:  A. PLEURAL MASS, LEFT CHEST; BIOPSY:  - DIAGNOSTIC OF MALIGNANCY.  - NON-SMALL CELL CARCINOMA, FAVOR ADENOCARCINOMA.   Comment:  The tumor cells are positive for CK7 and TTF1 (patchy).  They are  negative for p40.   # 12/10/2020-carbo Alimta; cycle #1 [Keytruda pending insurance]  #NGS PD-L1 TPS 1%   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[place])   05/02/2015 Initial Diagnosis   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HCC)   Cancer of lower lobe of left lung ([place])  12/02/2020 Initial Diagnosis   Cancer of lower lobe of left lung ([place])   12/10/2020 Cancer Staging   Staging form: Lung, AJCC 8th Edition - Clinical: Stage IVA (cT4, cN3, cM1a) - Signed by Cammie Sickle, MD on 12/10/2020    12/10/2020 -  Chemotherapy   Patient is on Treatment Plan : LUNG CARBOplatin / Pemetrexed / Pembrolizumab q21d Induction x 4 cycles / Maintenance Pemetrexed + Pembrolizumab        HISTORY OF PRESENTING ILLNESS: Ambulating independently.  Accompanied by her son.   Chamia Neilan 66 y.o.  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  Patient admits to mild right upper quadrant abdominal pain-worse with movement/deep breaths.  Last about 15 to 20 seconds couple of episodes of the last couple of weeks.  No dietary changes.  Consider diarrhea on 1 day resolved with Imodium.  No further falls..  Review of Systems  Constitutional:  Positive for malaise/fatigue. Negative for chills, diaphoresis, fever and weight loss.  HENT:  Negative for nosebleeds and sore throat.   Eyes:  Negative for double vision.  Respiratory:  Positive for cough and sputum production. Negative for hemoptysis and shortness of breath.   Cardiovascular:  Negative for palpitations, orthopnea and leg swelling.  Gastrointestinal:  Negative for abdominal pain, blood in stool, constipation, diarrhea, heartburn, melena, nausea and vomiting.  Genitourinary:  Negative for dysuria, frequency and urgency.  Musculoskeletal:  Negative for back pain and joint pain.  Skin: Negative.  Negative for itching and rash.  Neurological:  Negative for dizziness, tingling,  focal weakness and weakness.  Endo/Heme/Allergies:  Does not bruise/bleed easily.   Psychiatric/Behavioral:  Negative for depression. The patient is not nervous/anxious and does not have insomnia.     MEDICAL HISTORY:  Past Medical History:  Diagnosis Date   Barrett's esophagus    Dysrhythmia    stress related at work.   GERD (gastroesophageal reflux disease)    Hepatitis C 2008   Hyperlipidemia    Hypertension    Hypothyroidism    Nephrolithiasis    Non-small cell carcinoma of left lung (HCC)    Osteoporosis    Pulmonary mass    Thyroid cancer ([place]) 2012   Thyroid cancer ([place])     SURGICAL HISTORY: Past Surgical History:  Procedure Laterality Date   ABDOMINAL HYSTERECTOMY  2006   COLONOSCOPY WITH PROPOFOL N/A 04/25/2015   Procedure: COLONOSCOPY WITH PROPOFOL;  Surgeon: Josefine Class, MD;  Location[facility] END[facility]COPY;  Service: Endoscopy;  Laterality: N/A;   CYSTOSCOPY W/ RETROGRADES Bilateral 05/19/2015   Procedure: CYSTOSCOPY WITH RETROGRADE PYELOGRAM;  Surgeon: Hollice Espy, MD;  Location: [facility] ORS;  Service: Urology;  Laterality: Bilateral;   CYSTOSCOPY W/ URETERAL STENT PLACEMENT Right 05/26/2015   Procedure: CYSTOSCOPY WITH STENT REPLACEMENT;  Surgeon: Hollice Espy, MD;  Location: [facility] ORS;  Service: Urology;  Laterality: Right;   CYSTOSCOPY WITH STENT PLACEMENT Right 05/19/2015   Procedure: CYSTOSCOPY WITH STENT PLACEMENT;  Surgeon: Hollice Espy, MD;  Location: [facility] ORS;  Service: Urology;  Laterality: Right;   CYSTOSCOPY WITH STENT PLACEMENT Right 05/26/2015   Procedure: CYSTOSCOPY WITH STENT PLACEMENT;  Surgeon: Hollice Espy, MD;  Location: [facility] ORS;  Service: Urology;  Laterality: Right;   CYSTOSCOPY WITH STENT PLACEMENT Right 06/25/2016   Procedure: CYSTOSCOPY WITH STENT PLACEMENT;  Surgeon: Nickie Retort, MD;  Location: [facility] ORS;  Service: Urology;  Laterality: Right;   CYSTOSCOPY WITH STENT PLACEMENT Left 10/31/2017   Procedure: CYSTOSCOPY WITH STENT PLACEMENT;  Surgeon: Hollice Espy, MD;  Location: [facility] ORS;  Service: Urology;   Laterality: Left;   CYSTOSCOPY/URETEROSCOPY/HOLMIUM LASER/STENT PLACEMENT Left 07/23/2015   Procedure: CYSTOSCOPY/URETEROSCOPY/HOLMIUM LASER/STENT PLACEMENT/RETROGRADE PYELOGRAM;  Surgeon: Hollice Espy, MD;  Location: [facility] ORS;  Service: Urology;  Laterality: Left;   CYSTOSCOPY/URETEROSCOPY/HOLMIUM LASER/STENT PLACEMENT Left 11/16/2017   Procedure: CYSTOSCOPY/URETEROSCOPY/HOLMIUM LASER/STENT Exchange;  Surgeon: Hollice Espy, MD;  Location: [facility] ORS;  Service: Urology;  Laterality: Left;   ESOPHAGOGASTRODUODENOSCOPY (EGD) WITH PROPOFOL N/A 04/25/2015   Procedure: ESOPHAGOGASTRODUODENOSCOPY (EGD) WITH PROPOFOL;  Surgeon: Josefine Class, MD;  Location: [facility] ENDOSCOPY;  Service: Endoscopy;  Laterality: N/A;   EYE SURGERY Bilateral 1964   lazy eye repair   EYE SURGERY Bilateral 2001   lazy eye repair   IR IMAGING GUIDED PORT INSERTION  12/08/2020   LITHOTRIPSY  2009   THYROIDECTOMY  2010   URETEROSCOPY WITH HOLMIUM LASER LITHOTRIPSY Right 05/19/2015   Procedure: URETEROSCOPY WITH HOLMIUM LASER LITHOTRIPSY;  Surgeon: Hollice Espy, MD;  Location: [facility] ORS;  Service: Urology;  Laterality: Right;   URETEROSCOPY WITH HOLMIUM LASER LITHOTRIPSY Right 05/26/2015   Procedure: URETEROSCOPY WITH HOLMIUM LASER LITHOTRIPSY;  Surgeon: Hollice Espy, MD;  Location: [facility] ORS;  Service: Urology;  Laterality: Right;   URETEROSCOPY WITH HOLMIUM LASER LITHOTRIPSY Right 06/25/2016   Procedure: URETEROSCOPY WITH HOLMIUM LASER LITHOTRIPSY;  Surgeon: Nickie Retort, MD;  Location: [facility] ORS;  Service: Urology;  Laterality: Right;    SOCIAL HISTORY: Social History   Socioeconomic History   Marital status: Divorced    Spouse name: Not on file   Number of children: Not  on file   Years of education: Not on file   Highest education level: Not on file  Occupational History   Not on file  Tobacco Use   Smoking status: Every Day    Packs/day: 1.00    Years: 40.00    Pack years: 40.00    Types: Cigarettes    Smokeless tobacco: Never  Vaping Use   Vaping Use: Every day  Substance and Sexual Activity   Alcohol use: Not Currently    Comment: socially   Drug use: No   Sexual activity: Not Currently    Birth control/protection: None  Other Topics Concern   Not on file  Social History Narrative   Smoking: 1p 1-2 days since 17 years; alcohol: rare; 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     Social Determinants of Health   Financial Resource Strain: Not on file  Food Insecurity: Not on file  Transportation Needs: Not on file  Physical Activity: Not on file  Stress: Not on file  Social Connections: Not on file  Intimate Partner Violence: Not on file    FAMILY HISTORY: Family History  Problem Relation Age of Onset   CAD Mother    Heart disease Mother    Dementia Father    Bladder Cancer Neg Hx    Prostate cancer Neg Hx    Kidney cancer Neg Hx    Breast cancer Neg Hx     ALLERGIES:  has No Known Allergies.  MEDICATIONS:  Current Outpatient Medications  Medication Sig Dispense Refill   Accu-Chek Softclix Lancets lancets Check glucose once a day 100 each 12   albuterol (VENTOLIN HFA) 108 (90 Base) MCG/ACT inhaler Inhale 2 puffs into the lungs every 6 (six) hours as needed for wheezing or shortness of breath. 8 g 2   amLODipine (NORVASC) 5 MG tablet Take 5 mg by mouth every morning.      atorvastatin (LIPITOR) 80 MG tablet Take 80 mg by mouth daily.     blood glucose meter kit and supplies KIT Dispense based on patient and insurance preference. Use up to three times daily as directed. 1 each 0   cholecalciferol (VITAMIN D) 1000 units tablet Take 2,000 Units by mouth daily.     Cranberry-Vitamin C-Probiotic (AZO CRANBERRY PO) Take 1 tablet by mouth daily.     fluticasone-salmeterol (ADVAIR HFA) 45-21 MCG/ACT inhaler Inhale 2 puffs into the lungs 2 (two) times daily. 1 each 12   folic acid (FOLVITE) 1 MG tablet  Take 1 tablet (1 mg total) by mouth daily. 90 tablet 1   glipiZIDE (GLUCOTROL) 5 MG tablet Take 1 tablet (5 mg total) by mouth 2 (two) times daily before a meal. 180 tablet 1   HYDROcodone-acetaminophen (NORCO/VICODIN) 5-325 MG tablet Take 1 tablet by mouth every 8 (eight) hours as needed for moderate pain. 60 tablet 0   levothyroxine (SYNTHROID) 175 MCG tablet Sat and Sun ON AN EMPTY STOMACH WITH A GLASS OF WATER AT LEAST 30-60 MINUTES BEFORE BREAKFAST     levothyroxine (SYNTHROID, LEVOTHROID) 150 MCG tablet Take 150 mcg by mouth at bedtime.      lidocaine (LIDODERM) 5 % 1 patch daily.     lidocaine-prilocaine (EMLA) cream Apply 1 application topically as needed. 30 g 0   losartan (COZAAR) 100 MG tablet Take 100 mg by mouth every morning.      metoprolol succinate (TOPROL-XL) 25 MG 24 hr tablet Take 25 mg by mouth every  morning.   1   nystatin (MYCOSTATIN) 100000 UNIT/ML suspension Swish and swallow. Don't drink fluids immediately following administration. 400 mL 0   omeprazole (PRILOSEC) 20 MG capsule Take 20 mg by mouth daily before breakfast.      ondansetron (ZOFRAN) 8 MG tablet Take 1 tablet (8 mg total) by mouth every 8 (eight) hours as needed for nausea or vomiting. Start on the third day after chemotherapy 30 tablet 2   prochlorperazine (COMPAZINE) 10 MG tablet Take 1 tablet (10 mg total) by mouth every 6 (six) hours as needed for nausea or vomiting. 30 tablet 2   alendronate (FOSAMAX) 70 MG tablet One tab every Sunday on empty stomach with a full glass of water. Do not lie down or eat/drink anything else for the next 30 min. (Patient not taking: Reported on 03/04/2021)     amoxicillin-clavulanate (AUGMENTIN) 875-125 MG tablet Take 1 tablet by mouth 2 (two) times daily. (Patient not taking: No sig reported)     dexamethasone (DECADRON) 4 MG tablet Take 1 tablet by mouth twice a day the day before chemotherapy (Patient not taking: Reported on 03/04/2021) 30 tablet 0   predniSONE (DELTASONE) 10  MG tablet Take 10 mg by mouth daily. (Patient not taking: Reported on 12/31/2020)     No current facility-administered medications for this visit.      Marland Kitchen  PHYSICAL EXAMINATION: ECOG PERFORMANCE STATUS: 1 - Symptomatic but completely ambulatory  Vitals:   03/04/21 0903  BP: (!) 161/88  Pulse: 89  Resp: 16  Temp: 98.2 F (36.8 C)    Filed Weights   03/04/21 0903  Weight: 190 lb 12.8 oz (86.5 kg)   No significant right upper quadrant tenderness noted.  Physical Exam Vitals and nursing note reviewed.  HENT:     Head: Normocephalic and atraumatic.     Mouth/Throat:     Pharynx: Oropharynx is clear.  Eyes:     Extraocular Movements: Extraocular movements intact.     Pupils: Pupils are equal, round, and reactive to light.  Cardiovascular:     Rate and Rhythm: Normal rate and regular rhythm.  Pulmonary:     Comments: Decreased breath sounds bilaterally.  Abdominal:     Palpations: Abdomen is soft.  Musculoskeletal:        General: Normal range of motion.     Cervical back: Normal range of motion.  Skin:    General: Skin is warm.  Neurological:     General: No focal deficit present.     Mental Status: She is alert and oriented to person, place, and time.  Psychiatric:        Behavior: Behavior normal.        Judgment: Judgment normal.     LABORATORY DATA:  I have reviewed the data as listed Lab Results  Component Value Date   WBC 5.5 03/04/2021   HGB 10.3 (L) 03/04/2021   HCT 32.6 (L) 03/04/2021   MCV 101.6 (H) 03/04/2021   PLT 152 03/04/2021   Recent Labs    01/21/21 0833 02/11/21 0858 03/04/21 0833  NA 142 138 141  K 3.8 3.5 4.0  CL 107 104 105  CO2 _0 GLUCOSE 141* 144* 125*  BUN _1 CREATININE 0.63 0.76 0.62  CALCIUM 8.2* 8.7* 8.7*  GFRNONAA >60 >60 >60  PROT 6.7 7.1 7.1  ALBUMIN 3.6 3.7 3.8  AST _2 ALT _3 ALKPHOS 65 62 71  BILITOT 0.3  0.3 0.5    RADIOGRAPHIC STUDIES: I have personally reviewed the  radiological images as listed and agreed with the findings in the report. MR BRAIN W WO CONTRAST  Result Date: 02/04/2021 CLINICAL DATA:  headaches/dizzyness/cancer EXAM: MRI HEAD WITHOUT AND WITH CONTRAST TECHNIQUE: Multiplanar, multiecho pulse sequences of the brain and surrounding structures were obtained without and with intravenous contrast. CONTRAST:  1m GADAVIST GADOBUTROL 1 MMOL/ML IV SOLN COMPARISON:  MRI November 16, 2020. FINDINGS: Brain: No evidence of acute infarct. No hydrocephalus, abnormal mass effect, acute hemorrhage, or extra-axial fluid collection. Mild patchy T2/FLAIR hyperintensities in the white matter, nonspecific but compatible with chronic microvascular ischemic disease. No abnormal enhancement. Previously seen area of enhancement in the right cerebellum on the prior is no longer appreciable and likely was artifactual. Similar asymmetric atrophy and T2/FLAIR hyperintensity of the left hippocampus (for example series 15, image 20). Vascular: Major arterial flow voids are maintained at the skull base. Skull and upper cervical spine: Normal marrow signal. Sinuses/Orbits: Mild paranasal sinus mucosal thickening. Other: No mastoid effusions. IMPRESSION: 1. No evidence of acute intracranial abnormality or metastatic disease. Previously seen enhancement in the right cerebellum on the prior is no longer appreciable and likely was artifactual. 2. Similar asymmetric left hippocampal volume loss and T2/FLAIR hyperintense signal. As discussed on prior, this could be secondary to mesial temporal sclerosis if the patient has a history of seizures. Alternatively, this may reflect the sequela of nonspecific remote insult. Electronically Signed   By: FMargaretha SheffieldM.D.   On: 02/04/2021 10:12    ASSESSMENT & PLAN:   Cancer of lower lobe of left lung ([place] #Stage IV -non-small cell favor adenocarcinoma; on carbo-alita-keytruda #2- DEC 2nd, 2022- September 2022 CT scan- Similar appearance of  metastatic bronchogenic neoplasm with signs of pleural involvement; stable bilateral multifocal groundglass opacities; New periosteal reaction about the LEFT lateral ninth rib-likely sclerosis from ongoing therapy.  #Proceed with  maintaintnance  Alimta Keytruda #1 today Labs today reviewed;mild anemia- Hb 10;   acceptable for treatment today.  will order scans today.   # RUQ pain- [2 weeks; 2 episodes 15-20 sec]- [facility]- recommend NSAIDs prn for now; do not suspect gall bladder .   # Left chest wall pain:-improved/resolved-using hydrocodone qhs.   # COPD [Dr.A]: continue adviar/ abulterol prn. STABLE.     #Diabetes-A1c 6.9 [AUG 2022]-preprandial blood sugars mostly 125.  Continue glipizide 5 mg BID.   # Diarrhea-G-1-on immoiudm/ Oral mucositis- G-1- STABLE.   # [facility]- bil hip joint pain/weakness- ? STOP fosomax; STOP dex pre-med.s/p evaluation with MGwenette Greet will monitor for now.   # Mediport placement-dysfunction- recommend port study-   * [place]- 24th-4th./ B12 inj?  #DISPOSITION: # dye study- ordered.  # Chemo today # 3 week- MD- labs- cbc/cmp; - Alimta-keytruda; CT CAP- Dr.B          All questions were answered. The patient knows to call the clinic with any problems, questions or concerns.       GCammie Sickle MD 03/04/2021 10:38 AM

## 2021-03-04 NOTE — Progress Notes (Signed)
PAC with no blood return noted on initial access.  TPA instilled per MD order per protocol with brisk blood return noted after 30 minutes.  Pt tolerated all infusions well today and left the chemo suite stable and ambulatory.

## 2021-03-04 NOTE — Assessment & Plan Note (Addendum)
#  Stage IV -non-small cell favor adenocarcinoma; on carbo-alita-keytruda #2- DEC 2nd, 2022- September 2022 CT scan- Similar appearance of metastatic bronchogenic neoplasm with signs of pleural involvement; stable bilateral multifocal groundglass opacities; New periosteal reaction about the LEFT lateral ninth rib-likely sclerosis from ongoing therapy.  #Proceed with  maintaintnance  Alimta Keytruda #1 today Labs today reviewed;mild anemia- Hb 10;   acceptable for treatment today.  will order scans today.   # RUQ pain- [2 weeks; 2 episodes 15-20 sec]- [facility]- recommend NSAIDs prn for now; do not suspect gall bladder .   # Left chest wall pain:-improved/resolved-using hydrocodone qhs.   # COPD [Dr.A]: continue adviar/ abulterol prn. STABLE.     #Diabetes-A1c 6.9 [AUG 2022]-preprandial blood sugars mostly 125.  Continue glipizide 5 mg BID.   # Diarrhea-G-1-on immoiudm/ Oral mucositis- G-1- STABLE.   # [facility]- bil hip joint pain/weakness- ? STOP fosomax; STOP dex pre-med.s/p evaluation with Gwenette Greet- will monitor for now.   # Mediport placement-dysfunction- recommend port study-   * [place]- 24th-4th./ B12 inj?  #DISPOSITION: # dye study- ordered.  # Chemo today # 3 week- MD- labs- cbc/cmp; - Alimta-keytruda; CT CAP- Dr.B

## 2021-03-04 NOTE — Therapy (Signed)
[place] [facility] at [facility], [place], [place], [address] Phone: [phone]   Fax:  [phone]  Occupational Therapy Screen:  Patient Details  Name: Felicia Manning MRN: [id number] Date of Birth: April 14, 1955 No data recorded  Encounter Date: 03/04/2021   OT End of Session - 03/04/21 1940     Visit Number 0             Past Medical History:  Diagnosis Date   Barrett's esophagus    Dysrhythmia    stress related at work.   GERD (gastroesophageal reflux disease)    Hepatitis C 2008   Hyperlipidemia    Hypertension    Hypothyroidism    Nephrolithiasis    Non-small cell carcinoma of left lung (HCC)    Osteoporosis    Pulmonary mass    Thyroid cancer ([place]) 2012   Thyroid cancer [facility])     Past Surgical History:  Procedure Laterality Date   ABDOMINAL HYSTERECTOMY  2006   COLONOSCOPY WITH PROPOFOL N/A 04/25/2015   Procedure: COLONOSCOPY WITH PROPOFOL;  Surgeon: Josefine Class, MD;  Location: [facility] ENDOSCOPY;  Service: Endoscopy;  Laterality: N/A;   CYSTOSCOPY W/ RETROGRADES Bilateral 05/19/2015   Procedure: CYSTOSCOPY WITH RETROGRADE PYELOGRAM;  Surgeon: Hollice Espy, MD;  Location: [facility] ORS;  Service: Urology;  Laterality: Bilateral;   CYSTOSCOPY W/ URETERAL STENT PLACEMENT Right 05/26/2015   Procedure: CYSTOSCOPY WITH STENT REPLACEMENT;  Surgeon: Hollice Espy, MD;  Location: [facility] ORS;  Service: Urology;  Laterality: Right;   CYSTOSCOPY WITH STENT PLACEMENT Right 05/19/2015   Procedure: CYSTOSCOPY WITH STENT PLACEMENT;  Surgeon: Hollice Espy, MD;  Location: [facility] ORS;  Service: Urology;  Laterality: Right;   CYSTOSCOPY WITH STENT PLACEMENT Right 05/26/2015   Procedure: CYSTOSCOPY WITH STENT PLACEMENT;  Surgeon: Hollice Espy, MD;  Location: [facility] ORS;  Service: Urology;  Laterality: Right;   CYSTOSCOPY WITH STENT PLACEMENT Right 06/25/2016   Procedure: CYSTOSCOPY WITH STENT PLACEMENT;  Surgeon: Nickie Retort, MD;  Location: [facility] ORS;  Service: Urology;  Laterality: Right;   CYSTOSCOPY WITH STENT PLACEMENT Left 10/31/2017   Procedure: CYSTOSCOPY WITH STENT PLACEMENT;  Surgeon: Hollice Espy, MD;  Location: [facility] ORS;  Service: Urology;  Laterality: Left;   CYSTOSCOPY/URETEROSCOPY/HOLMIUM LASER/STENT PLACEMENT Left 07/23/2015   Procedure: CYSTOSCOPY/URETEROSCOPY/HOLMIUM LASER/STENT PLACEMENT/RETROGRADE PYELOGRAM;  Surgeon: Hollice Espy, MD;  Location: [facility] ORS;  Service: Urology;  Laterality: Left;   CYSTOSCOPY/URETEROSCOPY/HOLMIUM LASER/STENT PLACEMENT Left 11/16/2017   Procedure: CYSTOSCOPY/URETEROSCOPY/HOLMIUM LASER/STENT Exchange;  Surgeon: Hollice Espy, MD;  Location: [facility] ORS;  Service: Urology;  Laterality: Left;   ESOPHAGOGASTRODUODENOSCOPY (EGD) WITH PROPOFOL N/A 04/25/2015   Procedure: ESOPHAGOGASTRODUODENOSCOPY (EGD) WITH PROPOFOL;  Surgeon: Josefine Class, MD;  Location: [facility] ENDOSCOPY;  Service: Endoscopy;  Laterality: N/A;   EYE SURGERY Bilateral 1964   lazy eye repair   EYE SURGERY Bilateral 2001   lazy eye repair   IR IMAGING GUIDED PORT INSERTION  12/08/2020   LITHOTRIPSY  2009   THYROIDECTOMY  2010   URETEROSCOPY WITH HOLMIUM LASER LITHOTRIPSY Right 05/19/2015   Procedure: URETEROSCOPY WITH HOLMIUM LASER LITHOTRIPSY;  Surgeon: Hollice Espy, MD;  Location: [facility] ORS;  Service: Urology;  Laterality: Right;   URETEROSCOPY WITH HOLMIUM LASER LITHOTRIPSY Right 05/26/2015   Procedure: URETEROSCOPY WITH HOLMIUM LASER LITHOTRIPSY;  Surgeon: Hollice Espy, MD;  Location: [facility] ORS;  Service: Urology;  Laterality: Right;   URETEROSCOPY WITH HOLMIUM LASER LITHOTRIPSY Right 06/25/2016   Procedure: URETEROSCOPY WITH HOLMIUM LASER LITHOTRIPSY;  Surgeon: Nickie Retort, MD;  Location: [facility] ORS;  Service: Urology;  Laterality: Right;    There were no vitals filed for this visit.   Subjective Assessment - 03/04/21 1939     Subjective  I fell in December - I have 2 story house but  moving my room down stairs this week -my son with me  at the moment -then I am leaving next week to visit my daughter and new grandbaby in [place] - and then visiting me mom in [place], MD 03/04/2021 10:38 AM ASSESSMENT & PLAN:    Cancer of lower lobe of left lung ([place]) #Stage IV -non-small cell favor adenocarcinoma; on carbo-alita-keytruda #2- DEC 2nd, 2022- September 2022 CT scan- Similar appearance of metastatic bronchogenic neoplasm with signs of pleural involvement; stable bilateral multifocal groundglass opacities; New periosteal reaction about the LEFT lateral ninth rib-likely sclerosis from ongoing therapy.   #Proceed with  maintaintnance  Alimta Keytruda #1 today Labs today reviewed;mild anemia- Hb 10;   acceptable for treatment today.  will order scans today.    # RUQ pain- [2 weeks; 2 episodes 15-20 sec]- MSK- recommend NSAIDs prn for now; do not suspect gall bladder .    # Left chest wall pain:-improved/resolved-using hydrocodone qhs.    # COPD [Dr.A]: continue adviar/ abulterol prn. STABLE.      #Diabetes-A1c 6.9 [AUG 2022]-preprandial blood sugars mostly 125.  Continue glipizide 5 mg BID.    # Diarrhea-G-1-on immoiudm/ Oral mucositis- G-1- STABLE.    # MSK- bil hip joint pain/weakness- ? STOP fosomax; STOP dex pre-med.s/p evaluation with Gwenette Greet- will monitor for now.    # Mediport placement-dysfunction- recommend port study-    * [place]- 24th-4th./ B12 inj?  #DISPOSITION: # dye study- ordered.  # Chemo today # 3 week- MD- labs- cbc/cmp; - Alimta-keytruda; CT CAP- Dr.B        OT SCREEN 03/04/21: Pt refer to OT for screen for balance and weakness. She wanted order for Rollator to use at times -getting SOB. In session today pt was O2  96 and HR 97 Pt show strength in hips and knees 4/5 BERG balance test she scored 53/56 - low risk for falling 10 meter walking test was 7 sec,- WNL  O2 sats increase to 98% in  session. Discuss with pt in session that her balance and strength is [facility] - and low risk for falling. And that she benefit from some work out to increase O2. Son with pt this date - and recommend for pt to do daily about 2x 12 min or 4 x 6-8 min of exercises. HEP : Sit <> stand not using hands Walking sideways at counter top  Walk 1-2 min at time - increase to days that she feels better -to mailbox and back - if even surface Walk back wards -only if family member with her to guard her Pt to cont with HEP while visiting family Also recommend tub shower bench to use at home to decrease risk for falling in bathroom - has [place] shower  Moving her room downstairs -and teenager granddaugher lives with her Pt to follow up with me if needed -after she comes back from visiting family out of town  Visit Diagnosis: Weakness    Problem List Patient Active Problem List   Diagnosis Date Noted   Brain metastasis ([place]) 01/21/2021   Cancer of lower lobe of left lung ([place]) 12/02/2020   Sepsis ([place]) 08/13/2017   Acute diverticulitis 06/18/2017   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   Fungal infection of toenail 06/11/2015   Osteoporosis, post-menopausal 06/11/2015   Barrett esophagus 05/02/2015   Acid reflux 05/02/2015   HCV (hepatitis C virus) 05/02/2015   HLD (hyperlipidemia) 05/02/2015   BP (high blood pressure) 05/02/2015   Adult hypothyroidism 05/02/2015   Elevated WBC count 05/02/2015   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[place]) 05/02/2015   Kidney stone    Pyelonephritis    UTI (lower urinary tract infection) 04/08/2015   Flank pain 04/08/2015   Chronic hepatitis C virus infection ([place]) 03/12/2015   Essential (primary) hypertension 03/12/2015   Gastro-esophageal reflux disease without esophagitis 03/12/2015   Hypothyroidism, postop 03/12/2015   H/O malignant neoplasm of thyroid 01/29/2014    OP (osteoporosis) 10/06/2010   Current tobacco use 10/06/2010   Tobacco use 10/06/2010    Felicia Manning, OTR/L,CLT 03/04/2021, 7:40 PM  [place] at [facility] [address], [place], [place], [address] Phone: [phone]   Fax:  [phone]  Name: Felicia Manning MRN: [id number] Date of Birth: 06-21-1955

## 2021-03-04 NOTE — Progress Notes (Signed)
Nutrition Follow-up:  Patient with lung cancer, stage IV.  Patient receiving carbo/alimta, [name].  Met with patient during infusion.  Patient reports that appetite is good.  She has been including foods high in protein.  Planning to travel to [place] and ID to see grandbaby and mother's birthday.      Medications: reviewed  Labs: glucose 125  Anthropometrics:   Weight 190 lb 12.8 oz, stable  190 lb on 12/28 191 lb on 12/7 189 lb on 11/16 195 lb on 10/26  NUTRITION DIAGNOSIS: Food and beverage related knowledge deficit improved   INTERVENTION:  Discussed nutrition during travel. Continue good sources of protein.     MONITORING, EVALUATION, GOAL: weight trends, intake   NEXT VISIT: Wednesday, Feb 8 during infusion  Ulyana Pitones B. Zenia Resides, [place], [place] Registered Dietitian [phone] (mobile)

## 2021-03-10 ENCOUNTER — Encounter: Payer: Self-pay | Admitting: Internal Medicine

## 2021-03-16 ENCOUNTER — Encounter: Payer: Self-pay | Admitting: Internal Medicine

## 2021-03-23 ENCOUNTER — Other Ambulatory Visit: Payer: Self-pay

## 2021-03-23 ENCOUNTER — Ambulatory Visit
Admission: RE | Admit: 2021-03-23 | Discharge: 2021-03-23 | Disposition: A | Discharge status: Home / Self Care | Payer: 59 | Source: Ambulatory Visit | Attending: Internal Medicine | Admitting: Internal Medicine

## 2021-03-23 DIAGNOSIS — C3432 Malignant neoplasm of lower lobe, left bronchus or lung: Secondary | ICD-10-CM | POA: Diagnosis not present

## 2021-03-23 MED ORDER — IOHEXOL 300 MG/ML  SOLN
100.0000 mL | Freq: Once | INTRAMUSCULAR | Status: AC | PRN
  Administered 2021-03-23: 100 mL via INTRAVENOUS

## 2021-03-25 ENCOUNTER — Inpatient Hospital Stay: Payer: 59

## 2021-03-25 ENCOUNTER — Inpatient Hospital Stay: Payer: 59 | Attending: Internal Medicine

## 2021-03-25 ENCOUNTER — Inpatient Hospital Stay (HOSPITAL_BASED_OUTPATIENT_CLINIC_OR_DEPARTMENT_OTHER): Payer: 59 | Admitting: Internal Medicine

## 2021-03-25 ENCOUNTER — Other Ambulatory Visit: Payer: Self-pay

## 2021-03-25 ENCOUNTER — Encounter: Payer: Self-pay | Admitting: Internal Medicine

## 2021-03-25 DIAGNOSIS — N200 Calculus of kidney: Secondary | ICD-10-CM | POA: Insufficient documentation

## 2021-03-25 DIAGNOSIS — N281 Cyst of kidney, acquired: Secondary | ICD-10-CM | POA: Insufficient documentation

## 2021-03-25 DIAGNOSIS — J449 Chronic obstructive pulmonary disease, unspecified: Secondary | ICD-10-CM | POA: Diagnosis not present

## 2021-03-25 DIAGNOSIS — I2510 Atherosclerotic heart disease of native coronary artery without angina pectoris: Secondary | ICD-10-CM | POA: Diagnosis not present

## 2021-03-25 DIAGNOSIS — Z79899 Other long term (current) drug therapy: Secondary | ICD-10-CM | POA: Diagnosis not present

## 2021-03-25 DIAGNOSIS — E119 Type 2 diabetes mellitus without complications: Secondary | ICD-10-CM | POA: Insufficient documentation

## 2021-03-25 DIAGNOSIS — C3432 Malignant neoplasm of lower lobe, left bronchus or lung: Secondary | ICD-10-CM

## 2021-03-25 DIAGNOSIS — Z87891 Personal history of nicotine dependence: Secondary | ICD-10-CM | POA: Diagnosis not present

## 2021-03-25 DIAGNOSIS — Z7984 Long term (current) use of oral hypoglycemic drugs: Secondary | ICD-10-CM | POA: Diagnosis not present

## 2021-03-25 DIAGNOSIS — Z5111 Encounter for antineoplastic chemotherapy: Secondary | ICD-10-CM | POA: Insufficient documentation

## 2021-03-25 DIAGNOSIS — K8020 Calculus of gallbladder without cholecystitis without obstruction: Secondary | ICD-10-CM | POA: Diagnosis not present

## 2021-03-25 LAB — COMPREHENSIVE METABOLIC PANEL
ALT: 25 U/L (ref 0–44)
AST: 23 U/L (ref 15–41)
Albumin: 3.5 g/dL (ref 3.5–5.0)
Alkaline Phosphatase: 62 U/L (ref 38–126)
Anion gap: 8 (ref 5–15)
BUN: 15 mg/dL (ref 8–23)
CO2: 25 mmol/L (ref 22–32)
Calcium: 8.7 mg/dL — ABNORMAL LOW (ref 8.9–10.3)
Chloride: 106 mmol/L (ref 98–111)
Creatinine, Ser: 0.68 mg/dL (ref 0.44–1.00)
GFR, Estimated: >60 mL/min (ref >60)
Glucose, Bld: 132 mg/dL — ABNORMAL HIGH (ref 70–99)
Potassium: 3.5 mmol/L (ref 3.5–5.1)
Sodium: 139 mmol/L (ref 135–145)
Total Bilirubin: 0.2 mg/dL — ABNORMAL LOW (ref 0.3–1.2)
Total Protein: 6.6 g/dL (ref 6.5–8.1)

## 2021-03-25 LAB — CBC WITH DIFFERENTIAL/PLATELET
Abs Immature Granulocytes: 0.04 10*3/uL (ref 0.00–0.07)
Basophils Absolute: 0.1 10*3/uL (ref 0.0–0.1)
Basophils Relative: 1 %
Eosinophils Absolute: 0.3 10*3/uL (ref 0.0–0.5)
Eosinophils Relative: 3 %
HCT: 32.6 % — ABNORMAL LOW (ref 36.0–46.0)
Hemoglobin: 10.3 g/dL — ABNORMAL LOW (ref 12.0–15.0)
Immature Granulocytes: 1 %
Lymphocytes Relative: 24 %
Lymphs Abs: 2 10*3/uL (ref 0.7–4.0)
MCH: 33.8 pg (ref 26.0–34.0)
MCHC: 31.6 g/dL (ref 30.0–36.0)
MCV: 106.9 fL — ABNORMAL HIGH (ref 80.0–100.0)
Monocytes Absolute: 0.9 10*3/uL (ref 0.1–1.0)
Monocytes Relative: 11 %
Neutro Abs: 4.9 10*3/uL (ref 1.7–7.7)
Neutrophils Relative %: 60 %
Platelets: 237 10*3/uL (ref 150–400)
RBC: 3.05 MIL/uL — ABNORMAL LOW (ref 3.87–5.11)
RDW: 19.9 % — ABNORMAL HIGH (ref 11.5–15.5)
WBC: 8.1 10*3/uL (ref 4.0–10.5)
nRBC: 0 % (ref 0.0–0.2)

## 2021-03-25 MED ORDER — SODIUM CHLORIDE 0.9 % IV SOLN
Freq: Once | INTRAVENOUS | Status: AC
  Filled 2021-03-25: qty 250

## 2021-03-25 MED ORDER — SODIUM CHLORIDE 0.9 % IV SOLN: 8.0000 mg | Freq: Once | INTRAVENOUS | Status: DC

## 2021-03-25 MED ORDER — ONDANSETRON HCL 4 MG/2ML IJ SOLN
8.0000 mg | Freq: Once | INTRAMUSCULAR | Status: AC
  Administered 2021-03-25: 8 mg via INTRAVENOUS
  Filled 2021-03-25: qty 4

## 2021-03-25 MED ORDER — SODIUM CHLORIDE 0.9 % IV SOLN
200.0000 mg | Freq: Once | INTRAVENOUS | Status: AC
  Administered 2021-03-25: 200 mg via INTRAVENOUS
  Filled 2021-03-25: qty 200

## 2021-03-25 MED ORDER — SODIUM CHLORIDE 0.9 % IV SOLN
500.0000 mg/m2 | Freq: Once | INTRAVENOUS | Status: AC
  Administered 2021-03-25: 1000 mg via INTRAVENOUS
  Filled 2021-03-25: qty 40

## 2021-03-25 MED ORDER — HEPARIN SOD (PORK) LOCK FLUSH 100 UNIT/ML IV SOLN
500.0000 [IU] | Freq: Once | INTRAVENOUS | Status: AC | PRN
  Administered 2021-03-25: 500 [IU]
  Filled 2021-03-25: qty 5

## 2021-03-25 MED ORDER — PROCHLORPERAZINE MALEATE 10 MG PO TABS
10.0000 mg | ORAL_TABLET | Freq: Once | ORAL | Status: AC
  Administered 2021-03-25: 10 mg via ORAL
  Filled 2021-03-25: qty 1

## 2021-03-25 MED ORDER — CYANOCOBALAMIN 1000 MCG/ML IJ SOLN
1000.0000 ug | Freq: Once | INTRAMUSCULAR | Status: AC
  Administered 2021-03-25: 1000 ug via INTRAMUSCULAR
  Filled 2021-03-25: qty 1

## 2021-03-25 NOTE — Progress Notes (Signed)
After we finished infusion  pt developed "wave of nausea" and "Belly cramping".  Did have compazine 10 mg PO at 1004, ate crackers and drank sprite without issues.  BP ok. DR Rogue Bussing aware.  Ordered zofran 8 mg IV. Ok to release pt if she feels better

## 2021-03-25 NOTE — Assessment & Plan Note (Addendum)
#  Stage IV -non-small cell favor adenocarcinoma; on carbo-alita-keytruda #2-FEB 3rd, 2023-continued improved left lower lobe lung nodule/pleural-based nodules/hilar and subcarinal lymphadenopathy.   #Proceed with  maintaintnance  Alimta Keytruda #2 today Labs today reviewed;mild anemia- Hb 10;   acceptable for treatment today.   # Left chest wall pain:-improved/resolved-using hydrocodone qhs- STABLE.   # COPD [Dr.A]: continue adviar/ abulterol prn. STABLE.     # Diabetes-A1c 6.9 [AUG 2022]-preprandial blood sugars mostly 132.  Continue glipizide 5 mg BID. STABLE.   # Diarrhea-G-1-on immoiudm/ Oral mucositis- G-1- STABLE.   # Nausea- G-1 [id number] dex]-we will add Zofran with subsequent visits.  # MSK- bil hip joint pain/weakness- ? STOP fosomax; STOP dex pre-med.s/p evaluation with Gwenette Greet- will monitor for now.  Stable.  # Mediport placement-s/p dye study c tPA-functioning.  #Incidental findings on CT CAP Imaging dated: Feb 2nd, 2023- gall stones; cholelithiasis; hepatic and kidney cysts;athersclerosis; coronary calcification; nephrolithiasis: I reviewed/discussed/counseled the patient.    B12 inj-1/18   #DISPOSITION: # Chemo today # 3 week- MD- labs- cbc/cmp; - Alimta-keytruda; Dr.B   # I reviewed the blood work- with the patient in detail; also reviewed the imaging independently [as summarized above]; and with the patient in detail.

## 2021-03-25 NOTE — Progress Notes (Signed)
[facility] CONSULT NOTE  Patient Care Team: Dion Body, MD as PCP - General (Family Medicine) Leata Mouse (Inactive) Byrnett, Forest Gleason, MD (General Surgery) Telford Nab, RN as Oncology Nurse Navigator  CHIEF COMPLAINTS/PURPOSE OF CONSULTATION: lung cancer  Oncology History Overview Note  IMPRESSION: Hypermetabolic solid pulmonary nodule of the left lower lobe, favor primary lung malignancy.   Numerous hypermetabolic nodules of the lower left pleura, compatible with pleural metastatic disease.   Hypermetabolic subcarinal and left hilar lymph nodes, compatible with metastatic disease.   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 Recommend attention on follow-up.   Mild asymmetric FDG uptake below mediastinal blood pool within a small nodule of the left parotid gland, favored to be benign. Recommend attention on follow-up.   No evidence of metastatic disease in the abdomen or pelvis.   No evidence of osseous metastatic disease.   ---------------------  # MARCH 2022- incidental LLL [ 0.9 cm] s/p COVID vaccine; [Dr.Aleskerov]; July 2022- left pleural pain-   #  #October 2022 MRI brain-punctate lesion-asymptomatic [D/w- Dr.Vaslow]; December 2022 MRI negative for any acute process; chronic sclerosis of hippocampus-clinically insignificant.  COPD-non-complaint    ------------------   DIAGNOSIS:  A. PLEURAL MASS, LEFT CHEST; BIOPSY:  - DIAGNOSTIC OF MALIGNANCY.  - NON-SMALL CELL CARCINOMA, FAVOR ADENOCARCINOMA.   Comment:  The tumor cells are positive for CK7 and TTF1 (patchy).  They are  negative for p40.   # 12/10/2020-carbo Alimta; cycle #1 [Keytruda pending insurance]  #NGS PD-L1 TPS 1%   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[place])   05/02/2015 Initial Diagnosis   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[facility])   Cancer of lower lobe of left lung ([place])  12/02/2020 Initial Diagnosis   Cancer of lower lobe of left lung ([place])   12/10/2020 Cancer Staging   Staging form: Lung, AJCC 8th Edition - Clinical: Stage IVA (cT4, cN3, cM1a) - Signed by Cammie Sickle, MD on 12/10/2020    12/10/2020 -  Chemotherapy   Patient is on Treatment Plan : LUNG CARBOplatin / Pemetrexed / Pembrolizumab q21d Induction x 4 cycles / Maintenance Pemetrexed + Pembrolizumab        HISTORY OF PRESENTING ILLNESS: Ambulating independently.  Alone.  Felicia Manning 66 y.o.  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   In the interim patient had a uneventful trip to [place].  Patient denies any worsening pain.  Appetite is good.  No weight loss.  Patient had mild to moderate nausea after the last round of chemotherapy [taken on dexamethasone]-improved with Zofran.  Intermittent loose stools resolved with Imodium.  No further falls..  Review of Systems  Constitutional:  Positive for malaise/fatigue. Negative for chills, diaphoresis, fever and weight loss.  HENT:  Negative for nosebleeds and sore throat.   Eyes:  Negative for double vision.  Respiratory:  Positive for cough and sputum production. Negative for hemoptysis and shortness of breath.   Cardiovascular:  Negative for palpitations, orthopnea and leg swelling.  Gastrointestinal:  Negative for abdominal pain, blood in stool, constipation, diarrhea, heartburn, melena, nausea and vomiting.  Genitourinary:  Negative for dysuria, frequency and urgency.  Musculoskeletal:  Negative for back pain and joint pain.  Skin: Negative.  Negative for  itching and rash.  Neurological:  Negative for dizziness, tingling, focal weakness and  weakness.  Endo/Heme/Allergies:  Does not bruise/bleed easily.  Psychiatric/Behavioral:  Negative for depression. The patient is not nervous/anxious and does not have insomnia.     MEDICAL HISTORY:  Past Medical History:  Diagnosis Date   Barrett's esophagus    Dysrhythmia    stress related at work.   GERD (gastroesophageal reflux disease)    Hepatitis C 2008   Hyperlipidemia    Hypertension    Hypothyroidism    Nephrolithiasis    Non-small cell carcinoma of left lung ([facility])    Osteoporosis    Pulmonary mass    Thyroid cancer ([place]) 2012   Thyroid cancer ([place])     SURGICAL HISTORY: Past Surgical History:  Procedure Laterality Date   ABDOMINAL HYSTERECTOMY  2006   COLONOSCOPY WITH PROPOFOL N/A 04/25/2015   Procedure: COLONOSCOPY WITH PROPOFOL;  Surgeon: Josefine Class, MD;  Location: [facility] ENDOSCOPY;  Service: Endoscopy;  Laterality: N/A;   CYSTOSCOPY W/ RETROGRADES Bilateral 05/19/2015   Procedure: CYSTOSCOPY WITH RETROGRADE PYELOGRAM;  Surgeon: Hollice Espy, MD;  Location: [facility] ORS;  Service: Urology;  Laterality: Bilateral;   CYSTOSCOPY W/ URETERAL STENT PLACEMENT Right 05/26/2015   Procedure: CYSTOSCOPY WITH STENT REPLACEMENT;  Surgeon: Hollice Espy, MD;  Location: [facility] ORS;  Service: Urology;  Laterality: Right;   CYSTOSCOPY WITH STENT PLACEMENT Right 05/19/2015   Procedure: CYSTOSCOPY WITH STENT PLACEMENT;  Surgeon: Hollice Espy, MD;  Location: [facility] ORS;  Service: Urology;  Laterality: Right;   CYSTOSCOPY WITH STENT PLACEMENT Right 05/26/2015   Procedure: CYSTOSCOPY WITH STENT PLACEMENT;  Surgeon: Hollice Espy, MD;  Location: [facility] ORS;  Service: Urology;  Laterality: Right;   CYSTOSCOPY WITH STENT PLACEMENT Right 06/25/2016   Procedure: CYSTOSCOPY WITH STENT PLACEMENT;  Surgeon: Nickie Retort, MD;  Location: [facility] ORS;  Service: Urology;  Laterality: Right;   CYSTOSCOPY WITH STENT PLACEMENT Left 10/31/2017   Procedure: CYSTOSCOPY WITH STENT PLACEMENT;  Surgeon:  Hollice Espy, MD;  Location: [facility] ORS;  Service: Urology;  Laterality: Left;   CYSTOSCOPY/URETEROSCOPY/HOLMIUM LASER/STENT PLACEMENT Left 07/23/2015   Procedure: CYSTOSCOPY/URETEROSCOPY/HOLMIUM LASER/STENT PLACEMENT/RETROGRADE PYELOGRAM;  Surgeon: Hollice Espy, MD;  Location: [facility] ORS;  Service: Urology;  Laterality: Left;   CYSTOSCOPY/URETEROSCOPY/HOLMIUM LASER/STENT PLACEMENT Left 11/16/2017   Procedure: CYSTOSCOPY/URETEROSCOPY/HOLMIUM LASER/STENT Exchange;  Surgeon: Hollice Espy, MD;  Location: [facility] ORS;  Service: Urology;  Laterality: Left;   ESOPHAGOGASTRODUODENOSCOPY (EGD) WITH PROPOFOL N/A 04/25/2015   Procedure: ESOPHAGOGASTRODUODENOSCOPY (EGD) WITH PROPOFOL;  Surgeon: Josefine Class, MD;  Location: [facility] ENDOSCOPY;  Service: Endoscopy;  Laterality: N/A;   EYE SURGERY Bilateral 1964   lazy eye repair   EYE SURGERY Bilateral 2001   lazy eye repair   IR IMAGING GUIDED PORT INSERTION  12/08/2020   LITHOTRIPSY  2009   THYROIDECTOMY  2010   URETEROSCOPY WITH HOLMIUM LASER LITHOTRIPSY Right 05/19/2015   Procedure: URETEROSCOPY WITH HOLMIUM LASER LITHOTRIPSY;  Surgeon: Hollice Espy, MD;  Location: [facility] ORS;  Service: Urology;  Laterality: Right;   URETEROSCOPY WITH HOLMIUM LASER LITHOTRIPSY Right 05/26/2015   Procedure: URETEROSCOPY WITH HOLMIUM LASER LITHOTRIPSY;  Surgeon: Hollice Espy, MD;  Location: [facility] ORS;  Service: Urology;  Laterality: Right;   URETEROSCOPY WITH HOLMIUM LASER LITHOTRIPSY Right 06/25/2016   Procedure: URETEROSCOPY WITH HOLMIUM LASER LITHOTRIPSY;  Surgeon: Nickie Retort, MD;  Location: [facility] ORS;  Service: Urology;  Laterality: Right;    SOCIAL HISTORY: Social History   Socioeconomic History   Marital status: Divorced    Spouse  name: Not on file   Number of children: Not on file   Years of education: Not on file   Highest education level: Not on file  Occupational History   Not on file  Tobacco Use   Smoking status: Every Day    Packs/day: 1.00     Years: 40.00    Pack years: 40.00    Types: Cigarettes   Smokeless tobacco: Never  Vaping Use   Vaping Use: Every day  Substance and Sexual Activity   Alcohol use: Not Currently    Comment: socially   Drug use: No   Sexual activity: Not Currently    Birth control/protection: None  Other Topics Concern   Not on file  Social History Narrative   Smoking: 1p 1-2 days since 17 years; alcohol: rare; 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     Social Determinants of Health   Financial Resource Strain: Not on file  Food Insecurity: Not on file  Transportation Needs: Not on file  Physical Activity: Not on file  Stress: Not on file  Social Connections: Not on file  Intimate Partner Violence: Not on file    FAMILY HISTORY: Family History  Problem Relation Age of Onset   CAD Mother    Heart disease Mother    Dementia Father    Bladder Cancer Neg Hx    Prostate cancer Neg Hx    Kidney cancer Neg Hx    Breast cancer Neg Hx     ALLERGIES:  has No Known Allergies.  MEDICATIONS:  Current Outpatient Medications  Medication Sig Dispense Refill   Accu-Chek Softclix Lancets lancets Check glucose once a day 100 each 12   albuterol (VENTOLIN HFA) 108 (90 Base) MCG/ACT inhaler Inhale 2 puffs into the lungs every 6 (six) hours as needed for wheezing or shortness of breath. 8 g 2   amLODipine (NORVASC) 5 MG tablet Take 5 mg by mouth every morning.      blood glucose meter kit and supplies KIT Dispense based on patient and insurance preference. Use up to three times daily as directed. 1 each 0   cholecalciferol (VITAMIN D) 1000 units tablet Take 2,000 Units by mouth daily.     Cranberry-Vitamin C-Probiotic (AZO CRANBERRY PO) Take 1 tablet by mouth daily.     fluticasone-salmeterol (ADVAIR HFA) 45-21 MCG/ACT inhaler Inhale 2 puffs into the lungs 2 (two) times daily. 1 each 12   folic acid (FOLVITE) 1 MG tablet Take 1  tablet (1 mg total) by mouth daily. 90 tablet 1   glipiZIDE (GLUCOTROL) 5 MG tablet Take 1 tablet (5 mg total) by mouth 2 (two) times daily before a meal. 180 tablet 1   HYDROcodone-acetaminophen (NORCO/VICODIN) 5-325 MG tablet Take 1 tablet by mouth every 8 (eight) hours as needed for moderate pain. 60 tablet 0   levothyroxine (SYNTHROID) 175 MCG tablet Sat and Sun ON AN EMPTY STOMACH WITH A GLASS OF WATER AT LEAST 30-60 MINUTES BEFORE BREAKFAST     levothyroxine (SYNTHROID, LEVOTHROID) 150 MCG tablet Take 150 mcg by mouth at bedtime.      lidocaine-prilocaine (EMLA) cream Apply 1 application topically as needed. 30 g 0   losartan (COZAAR) 100 MG tablet Take 100 mg by mouth every morning.      metoprolol succinate (TOPROL-XL) 25 MG 24 hr tablet Take 25 mg by mouth every morning.   1   nystatin (MYCOSTATIN) 100000 UNIT/ML suspension Swish and swallow. Don't drink  fluids immediately following administration. 400 mL 0   omeprazole (PRILOSEC) 20 MG capsule Take 20 mg by mouth daily before breakfast.      ondansetron (ZOFRAN) 8 MG tablet Take 1 tablet (8 mg total) by mouth every 8 (eight) hours as needed for nausea or vomiting. Start on the third day after chemotherapy 30 tablet 2   prochlorperazine (COMPAZINE) 10 MG tablet Take 1 tablet (10 mg total) by mouth every 6 (six) hours as needed for nausea or vomiting. 30 tablet 2   amoxicillin-clavulanate (AUGMENTIN) 875-125 MG tablet Take 1 tablet by mouth 2 (two) times daily. (Patient not taking: No sig reported)     atorvastatin (LIPITOR) 80 MG tablet Take 80 mg by mouth daily. (Patient not taking: Reported on 03/25/2021)     dexamethasone (DECADRON) 4 MG tablet Take 1 tablet by mouth twice a day the day before chemotherapy (Patient not taking: Reported on 03/04/2021) 30 tablet 0   lidocaine (LIDODERM) 5 % 1 patch daily. (Patient not taking: Reported on 03/25/2021)     predniSONE (DELTASONE) 10 MG tablet Take 10 mg by mouth daily. (Patient not taking: Reported  on 12/31/2020)     No current facility-administered medications for this visit.      Marland Kitchen  PHYSICAL EXAMINATION: ECOG PERFORMANCE STATUS: 1 - Symptomatic but completely ambulatory  Vitals:   03/25/21 0855  BP: 131/71  Pulse: 72  Temp: 97.7 F (36.5 C)  SpO2: 98%    Filed Weights   03/25/21 0855  Weight: 189 lb 12.8 oz (86.1 kg)   No significant right upper quadrant tenderness noted.  Physical Exam Vitals and nursing note reviewed.  HENT:     Head: Normocephalic and atraumatic.     Mouth/Throat:     Pharynx: Oropharynx is clear.  Eyes:     Extraocular Movements: Extraocular movements intact.     Pupils: Pupils are equal, round, and reactive to light.  Cardiovascular:     Rate and Rhythm: Normal rate and regular rhythm.  Pulmonary:     Comments: Decreased breath sounds bilaterally.  Abdominal:     Palpations: Abdomen is soft.  Musculoskeletal:        General: Normal range of motion.     Cervical back: Normal range of motion.  Skin:    General: Skin is warm.  Neurological:     General: No focal deficit present.     Mental Status: She is alert and oriented to person, place, and time.  Psychiatric:        Behavior: Behavior normal.        Judgment: Judgment normal.     LABORATORY DATA:  I have reviewed the data as listed Lab Results  Component Value Date   WBC 8.1 03/25/2021   HGB 10.3 (L) 03/25/2021   HCT 32.6 (L) 03/25/2021   MCV 106.9 (H) 03/25/2021   PLT 237 03/25/2021   Recent Labs    02/11/21 0858 03/04/21 0833 03/25/21 0838  NA 138 141 139  K 3.5 4.0 3.5  CL 104 105 106  CO2 24 26 25   GLUCOSE 144* 125* 132*  BUN 13 12 15   CREATININE 0.76 0.62 0.68  CALCIUM 8.7* 8.7* 8.7*  GFRNONAA >60 >60 >60  PROT 7.1 7.1 6.6  ALBUMIN 3.7 3.8 3.5  AST 25 21 23   ALT 28 23 25   ALKPHOS 62 71 62  BILITOT 0.3 0.5 0.2*    RADIOGRAPHIC STUDIES: I have personally reviewed the radiological images as listed and agreed with the  findings in the report. CT  CHEST ABDOMEN PELVIS W CONTRAST  Result Date: 03/23/2021 CLINICAL DATA:  Metastatic left lower lobe non-small cell lung cancer restaging, ongoing chemotherapy and immunotherapy EXAM: CT CHEST, ABDOMEN, AND PELVIS WITH CONTRAST TECHNIQUE: Multidetector CT imaging of the chest, abdomen and pelvis was performed following the standard protocol during bolus administration of intravenous contrast. RADIATION DOSE REDUCTION: This exam was performed according to the departmental dose-optimization program which includes automated exposure control, adjustment of the mA and/or kV according to patient size and/or use of iterative reconstruction technique. CONTRAST:  142m OMNIPAQUE IOHEXOL 300 MG/ML SOLN, additional oral enteric contrast COMPARISON:  CT chest, 01/16/2021, PET-CT, 11/06/2020, CT abdomen pelvis, 10/23/2020, 12/06/2017 FINDINGS: CT CHEST FINDINGS Cardiovascular: Right chest port catheter. Aortic atherosclerosis. 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 Normal heart size. Scattered left and right coronary artery calcifications. No pericardial effusion. Mediastinum/Nodes: Interval decrease in size of enlarged left hilar and subcarinal nodes, left hilar nodes no longer discretely appreciated (series 2, image 30), subcarinal node measuring 1.9 x 0.9 cm, previously 2.4 x 1.3 cm when measured similarly (series 2, image 26). Small hiatal hernia. Thyroid gland, trachea, and esophagus demonstrate no significant findings. Lungs/Pleura: Diffuse bilateral bronchial wall thickening. Interval decrease in size of a spiculated nodule of left lower lobe, in the inferior costophrenic recess, measuring 1.6 x 1.2 cm, previously 2.2 x 1.6 cm (series 3, image 122). Previously hypermetabolic pleural nodules about the left lower lobe are significantly diminished, now measuring no greater than 1-2 mm in thickness (series 2, image 48, 40). Numerous scattered, generally amorphous ground-glass  opacities throughout the lungs are similar to prior examination (series 3, image 34, 41, 55, 79). Musculoskeletal: No chest wall mass or suspicious osseous lesions identified. There is a subacute, partially callused fracture of the lateral left ninth rib (series 3, image 132). CT ABDOMEN PELVIS FINDINGS Hepatobiliary: Hepatomegaly, maximum coronal span 20.8 cm, and hepatic steatosis. Unchanged hyperenhancing focus adjacent to the gallbladder fossa, hepatic segment V, measuring 1.0 cm and stable on examinations dating back to at least 2019, consistent with a benign flash filling hemangioma or perhaps a small focal nodular hyperplasia (series 2, image 62). Benign cyst of the right lobe of the liver, hepatic segment V (series 2, image 56). Faintly calcified gallstones in the gallbladder. No gallbladder wall thickening, or biliary dilatation. Pancreas: Unremarkable. No pancreatic ductal dilatation or surrounding inflammatory changes. Spleen: Normal in size without significant abnormality. Adrenals/Urinary Tract: Adrenal glands are unremarkable. Multiple small bilateral nonobstructive renal calculi. No ureteral calculi or hydronephrosis. Multiple bilateral benign simple exophytic cysts. Bladder is unremarkable. Stomach/Bowel: Stomach is within normal limits. Incidental diverticulum of the descending duodenum. Appendix appears normal. No evidence of bowel wall thickening, distention, or inflammatory changes. Descending and sigmoid diverticulosis. Vascular/Lymphatic: Aortic atherosclerosis. No enlarged abdominal or pelvic lymph nodes. Reproductive: Status post hysterectomy. Unchanged, benign simple cyst measuring 3.3 x 2.5 cm overlying the right psoas musculature, likely a postoperative seroma or lymphocele following hysterectomy and oophorectomy, as normal ovarian tissue is not seen. Other: No abdominal wall hernia or abnormality. No ascites. Musculoskeletal: No acute osseous findings. IMPRESSION: 1. Interval decrease in  size of a previously hypermetabolic spiculated nodule of left lower lobe. 2. Previously hypermetabolic pleural nodules about the left lower lobe are significantly diminished, now measuring no greater than 1-2 mm in thickness. 3. Interval decrease in size of enlarged left hilar and subcarinal nodes. 4. Constellation of findings is consistent with treatment response of primary lung malignancy  with pleural and nodal metastatic disease. 5. Numerous scattered, generally amorphous ground-glass opacities throughout the lungs are similar to prior examination, these remain nonspecific and likely infectious or inflammatory in nature. 6. Diffuse bilateral bronchial wall thickening, consistent with nonspecific infectious or inflammatory bronchitis. 7. Hepatomegaly and hepatic steatosis. 8. Cholelithiasis. 9. Nonobstructive bilateral nephrolithiasis. 10. Coronary artery disease. Aortic Atherosclerosis (ICD10-I70.0). Electronically Signed   By: Delanna Ahmadi M.D.   On: 03/23/2021 14:54    ASSESSMENT & PLAN:   Cancer of lower lobe of left lung ([place]) #Stage IV -non-small cell favor adenocarcinoma; on carbo-alita-keytruda #2-FEB 3rd, 2023-continued improved left lower lobe lung nodule/pleural-based nodules/hilar and subcarinal lymphadenopathy.   #Proceed with  maintaintnance  Alimta Keytruda #2 today Labs today reviewed;mild anemia- Hb 10;   acceptable for treatment today.   # Left chest wall pain:-improved/resolved-using hydrocodone qhs- STABLE.   # COPD [Dr.A]: continue adviar/ abulterol prn. STABLE.     # Diabetes-A1c 6.9 [AUG 2022]-preprandial blood sugars mostly 132.  Continue glipizide 5 mg BID. STABLE.   # Diarrhea-G-1-on immoiudm/ Oral mucositis- G-1- STABLE.   # Nausea- G-1 [id number] dex]-we will add Zofran with subsequent visits.  # MSK- bil hip joint pain/weakness- ? STOP fosomax; STOP dex pre-med.s/p evaluation with Gwenette Greet- will monitor for now.  Stable.  # Mediport placement-s/p dye study c  tPA-functioning.  #Incidental findings on CT CAP Imaging dated: Feb 2nd, 2023- gall stones; cholelithiasis; hepatic and kidney cysts;athersclerosis; coronary calcification; nephrolithiasis: I reviewed/discussed/counseled the patient.    B12 inj-1/18   #DISPOSITION: # Chemo today # 3 week- MD- labs- cbc/cmp; - Alimta-keytruda; Dr.B   # I reviewed the blood work- with the patient in detail; also reviewed the imaging independently [as summarized above]; and with the patient in detail.            All questions were answered. The patient knows to call the clinic with any problems, questions or concerns.       Cammie Sickle, MD 03/25/2021 1:17 PM

## 2021-03-25 NOTE — Patient Instructions (Signed)
[facility] AT [place]  Discharge Instructions: Thank you for choosing [facility] to provide your oncology and hematology care.  If you have a lab appointment with the [facility], please go directly to the [facility] and check in at the registration area.  Wear comfortable clothing and clothing appropriate for easy access to any Portacath or PICC line.   We strive to give you quality time with your provider. You may need to reschedule your appointment if you arrive late (15 or more minutes).  Arriving late affects you and other patients whose appointments are after yours.  Also, if you miss three or more appointments without notifying the office, you may be dismissed from the clinic at the providers discretion.      For prescription refill requests, have your pharmacy contact our office and allow 72 hours for refills to be completed.    Today you received the following chemotherapy and/or immunotherapy agents KEYTRUDA, ALIMTA, VIT B12      To help prevent nausea and vomiting after your treatment, we encourage you to take your nausea medication as directed.  BELOW ARE SYMPTOMS THAT SHOULD BE REPORTED IMMEDIATELY: *FEVER GREATER THAN 100.4 F (38 C) OR HIGHER *CHILLS OR SWEATING *NAUSEA AND VOMITING THAT IS NOT CONTROLLED WITH YOUR NAUSEA MEDICATION *UNUSUAL SHORTNESS OF BREATH *UNUSUAL BRUISING OR BLEEDING *URINARY PROBLEMS (pain or burning when urinating, or frequent urination) *BOWEL PROBLEMS (unusual diarrhea, constipation, pain near the anus) TENDERNESS IN MOUTH AND THROAT WITH OR WITHOUT PRESENCE OF ULCERS (sore throat, sores in mouth, or a toothache) UNUSUAL RASH, SWELLING OR PAIN  UNUSUAL VAGINAL DISCHARGE OR ITCHING   Items with * indicate a potential emergency and should be followed up as soon as possible or go to the Emergency Department if any problems should occur.  Please show the CHEMOTHERAPY ALERT CARD or IMMUNOTHERAPY ALERT CARD  at check-in to the Emergency Department and triage nurse.  Should you have questions after your visit or need to cancel or reschedule your appointment, please contact [facility] [place] [facility] [place]  [phone] and follow the prompts.  Office hours are 8:00 a.m. to 4:30 p.m. Monday - Friday. Please note that voicemails left after 4:00 p.m. may not be returned until the following business day.  We are closed weekends and major holidays. You have access to a nurse at all times for urgent questions. Please call the main number to the clinic [phone] and follow the prompts.  For any non-urgent questions, you may also contact your provider using MyChart. We now offer e-Visits for anyone 14 and older to request care online for non-urgent symptoms. For details visit [email or web address].   Also download the MyChart app! Go to the app store, search "MyChart", open the app, select [facility], and log in with your MyChart username and password.  Due to Covid, a mask is required upon entering the hospital/clinic. If you do not have a mask, one will be given to you upon arrival. For doctor visits, patients may have 1 support person aged 91 or older with them. For treatment visits, patients cannot have anyone with them due to current Covid guidelines and our immunocompromised population.   Pembrolizumab injection What is this medication? PEMBROLIZUMAB (pem broe liz ue mab) is a monoclonal antibody. It is used to treat certain types of cancer. This medicine may be used for other purposes; ask your health care provider or pharmacist if you have questions. COMMON BRAND NAME(S): Keytruda What should I tell  my care team before I take this medication? They need to know if you have any of these conditions: autoimmune diseases like Crohn's disease, ulcerative colitis, or lupus have had or planning to have an allogeneic stem cell transplant (uses someone else's stem cells) history of organ  transplant history of chest radiation nervous system problems like myasthenia gravis or Guillain-Barre syndrome an unusual or allergic reaction to pembrolizumab, other medicines, foods, dyes, or preservatives pregnant or trying to get pregnant breast-feeding How should I use this medication? This medicine is for infusion into a vein. It is given by a health care professional in a hospital or clinic setting. A special MedGuide will be given to you before each treatment. Be sure to read this information carefully each time. Talk to your pediatrician regarding the use of this medicine in children. While this drug may be prescribed for children as young as 6 months for selected conditions, precautions do apply. Overdosage: If you think you have taken too much of this medicine contact a poison control center or emergency room at once. NOTE: This medicine is only for you. Do not share this medicine with others. What if I miss a dose? It is important not to miss your dose. Call your doctor or health care professional if you are unable to keep an appointment. What may interact with this medication? Interactions have not been studied. This list may not describe all possible interactions. Give your health care provider a list of all the medicines, herbs, non-prescription drugs, or dietary supplements you use. Also tell them if you smoke, drink alcohol, or use illegal drugs. Some items may interact with your medicine. What should I watch for while using this medication? Your condition will be monitored carefully while you are receiving this medicine. You may need blood work done while you are taking this medicine. Do not become pregnant while taking this medicine or for 4 months after stopping it. Women should inform their doctor if they wish to become pregnant or think they might be pregnant. There is a potential for serious side effects to an unborn child. Talk to your health care professional or  pharmacist for more information. Do not breast-feed an infant while taking this medicine or for 4 months after the last dose. What side effects may I notice from receiving this medication? Side effects that you should report to your doctor or health care professional as soon as possible: allergic reactions like skin rash, itching or hives, swelling of the face, lips, or tongue bloody or black, tarry breathing problems changes in vision chest pain chills confusion constipation cough diarrhea dizziness or feeling faint or lightheaded fast or irregular heartbeat fever flushing joint pain low blood counts - this medicine may decrease the number of white blood cells, red blood cells and platelets. You may be at increased risk for infections and bleeding. muscle pain muscle weakness pain, tingling, numbness in the hands or feet persistent headache redness, blistering, peeling or loosening of the skin, including inside the mouth signs and symptoms of high blood sugar such as dizziness; dry mouth; dry skin; fruity breath; nausea; stomach pain; increased hunger or thirst; increased urination signs and symptoms of kidney injury like trouble passing urine or change in the amount of urine signs and symptoms of liver injury like dark urine, light-colored stools, loss of appetite, nausea, right upper belly pain, yellowing of the eyes or skin sweating swollen lymph nodes weight loss Side effects that usually do not require medical attention (report to  your doctor or health care professional if they continue or are bothersome): decreased appetite hair loss tiredness This list may not describe all possible side effects. Call your doctor for medical advice about side effects. You may report side effects to FDA at 1-800-FDA-1088. Where should I keep my medication? This drug is given in a hospital or clinic and will not be stored at home. NOTE: This sheet is a summary. It may not cover all possible  information. If you have questions about this medicine, talk to your doctor, pharmacist, or health care provider.  2022 Elsevier/Gold Standard (2020-10-21 00:00:00)   Pemetrexed injection What is this medication? PEMETREXED (PEM e TREX ed) is a chemotherapy drug used to treat lung cancers like non-small cell lung cancer and mesothelioma. It may also be used to treat other cancers. This medicine may be used for other purposes; ask your health care provider or pharmacist if you have questions. COMMON BRAND NAME(S): Alimta, PEMFEXY What should I tell my care team before I take this medication? They need to know if you have any of these conditions: infection (especially a virus infection such as chickenpox, cold sores, or herpes) kidney disease low blood counts, like low white cell, platelet, or red cell counts lung or breathing disease, like asthma radiation therapy an unusual or allergic reaction to pemetrexed, other medicines, foods, dyes, or preservative pregnant or trying to get pregnant breast-feeding How should I use this medication? This drug is given as an infusion into a vein. It is administered in a hospital or clinic by a specially trained health care professional. Talk to your pediatrician regarding the use of this medicine in children. Special care may be needed. Overdosage: If you think you have taken too much of this medicine contact a poison control center or emergency room at once. NOTE: This medicine is only for you. Do not share this medicine with others. What if I miss a dose? It is important not to miss your dose. Call your doctor or health care professional if you are unable to keep an appointment. What may interact with this medication? This medicine may interact with the following medications: Ibuprofen This list may not describe all possible interactions. Give your health care provider a list of all the medicines, herbs, non-prescription drugs, or dietary  supplements you use. Also tell them if you smoke, drink alcohol, or use illegal drugs. Some items may interact with your medicine. What should I watch for while using this medication? Visit your doctor for checks on your progress. This drug may make you feel generally unwell. This is not uncommon, as chemotherapy can affect healthy cells as well as cancer cells. Report any side effects. Continue your course of treatment even though you feel ill unless your doctor tells you to stop. In some cases, you may be given additional medicines to help with side effects. Follow all directions for their use. Call your doctor or health care professional for advice if you get a fever, chills or sore throat, or other symptoms of a cold or flu. Do not treat yourself. This drug decreases your body's ability to fight infections. Try to avoid being around people who are sick. This medicine may increase your risk to bruise or bleed. Call your doctor or health care professional if you notice any unusual bleeding. Be careful brushing and flossing your teeth or using a toothpick because you may get an infection or bleed more easily. If you have any dental work done, tell your dentist you  are receiving this medicine. Avoid taking products that contain aspirin, acetaminophen, ibuprofen, naproxen, or ketoprofen unless instructed by your doctor. These medicines may hide a fever. Call your doctor or health care professional if you get diarrhea or mouth sores. Do not treat yourself. To protect your kidneys, drink water or other fluids as directed while you are taking this medicine. Do not become pregnant while taking this medicine or for 6 months after stopping it. Women should inform their doctor if they wish to become pregnant or think they might be pregnant. Men should not father a child while taking this medicine and for 3 months after stopping it. This may interfere with the ability to father a child. You should talk to your  doctor or health care professional if you are concerned about your fertility. There is a potential for serious side effects to an unborn child. Talk to your health care professional or pharmacist for more information. Do not breast-feed an infant while taking this medicine or for 1 week after stopping it. What side effects may I notice from receiving this medication? Side effects that you should report to your doctor or health care professional as soon as possible: allergic reactions like skin rash, itching or hives, swelling of the face, lips, or tongue breathing problems redness, blistering, peeling or loosening of the skin, including inside the mouth signs and symptoms of bleeding such as bloody or black, tarry stools; red or dark-brown urine; spitting up blood or brown material that looks like coffee grounds; red spots on the skin; unusual bruising or bleeding from the eye, gums, or nose signs and symptoms of infection like fever or chills; cough; sore throat; pain or trouble passing urine signs and symptoms of kidney injury like trouble passing urine or change in the amount of urine signs and symptoms of liver injury like dark yellow or brown urine; general ill feeling or flu-like symptoms; light-colored stools; loss of appetite; nausea; right upper belly pain; unusually weak or tired; yellowing of the eyes or skin Side effects that usually do not require medical attention (report to your doctor or health care professional if they continue or are bothersome): constipation mouth sores nausea, vomiting unusually weak or tired This list may not describe all possible side effects. Call your doctor for medical advice about side effects. You may report side effects to FDA at 1-800-FDA-1088. Where should I keep my medication? This drug is given in a hospital or clinic and will not be stored at home. NOTE: This sheet is a summary. It may not cover all possible information. If you have questions about  this medicine, talk to your doctor, pharmacist, or health care provider.  2022 Elsevier/Gold Standard (2017-03-29 00:00:00)  Vitamin B12 Injection What is this medication? Vitamin B12 (VAHY tuh min B12) prevents and treats low vitamin B12 levels in your body. It is used in people who do not get enough vitamin B12 from their diet or when their digestive tract does not absorb enough. Vitamin B12 plays an important role in maintaining the health of your nervous system and red blood cells. This medicine may be used for other purposes; ask your health care provider or pharmacist if you have questions. COMMON BRAND NAME(S): B-12 Compliance Kit, B-12 Injection Kit, Cyomin, Dodex, LA-12, Nutri-Twelve, Physicians EZ Use B-12, Primabalt What should I tell my care team before I take this medication? They need to know if you have any of these conditions: Kidney disease Leber's disease Megaloblastic anemia An unusual or allergic  reaction to cyanocobalamin, cobalt, other medications, foods, dyes, or preservatives Pregnant or trying to get pregnant Breast-feeding How should I use this medication? This medication is injected into a muscle or deeply under the skin. It is usually given in a clinic or care team's office. However, your care team may teach you how to inject yourself. Follow all instructions. Talk to your care team about the use of this medication in children. Special care may be needed. Overdosage: If you think you have taken too much of this medicine contact a poison control center or emergency room at once. NOTE: This medicine is only for you. Do not share this medicine with others. What if I miss a dose? If you are given your dose at a clinic or care team's office, call to reschedule your appointment. If you give your own injections, and you miss a dose, take it as soon as you can. If it is almost time for your next dose, take only that dose. Do not take double or extra doses. What may  interact with this medication? Colchicine Heavy alcohol intake This list may not describe all possible interactions. Give your health care provider a list of all the medicines, herbs, non-prescription drugs, or dietary supplements you use. Also tell them if you smoke, drink alcohol, or use illegal drugs. Some items may interact with your medicine. What should I watch for while using this medication? Visit your care team regularly. You may need blood work done while you are taking this medication. You may need to follow a special diet. Talk to your care team. Limit your alcohol intake and avoid smoking to get the best benefit. What side effects may I notice from receiving this medication? Side effects that you should report to your care team as soon as possible: Allergic reactions--skin rash, itching, hives, swelling of the face, lips, tongue, or throat Swelling of the ankles, hands, or feet Trouble breathing Side effects that usually do not require medical attention (report to your care team if they continue or are bothersome): Diarrhea This list may not describe all possible side effects. Call your doctor for medical advice about side effects. You may report side effects to FDA at 1-800-FDA-1088. Where should I keep my medication? Keep out of the reach of children. Store at room temperature between 15 and 30 degrees C (59 and 85 degrees F). Protect from light. Throw away any unused medication after the expiration date. NOTE: This sheet is a summary. It may not cover all possible information. If you have questions about this medicine, talk to your doctor, pharmacist, or health care provider.  2022 Elsevier/Gold Standard (2020-04-16 00:00:00)

## 2021-03-25 NOTE — Progress Notes (Signed)
Nutrition Follow-up:  Patient with lung cancer, stage IV.  Patient received [place] and alimta today.   Met with patient during infusion.  Reports that she had a good time on her trip to [place] and [place] to see grandbaby and celebrate mother's birthday.  Diet was off a bit due to traveling but overall appetite has been good.  Has had more nausea this time due to stopping decadron with premeds.  Has been controlled with other nausea medications.  Does report diarrhea but controlled with imodium.      Medications: reviewed  Labs: reviewed  Anthropometrics:   Weight 189 lb 12.8 oz on 2/8  190 lb 12.8 on 1/18 190 lb on 12/7 189 lb on 11/16 195 lb on 10/26   NUTRITION DIAGNOSIS: Food and beverage related knowledge deficit improved    INTERVENTION:  Continue well balanced diet including good sources of protein    MONITORING, EVALUATION, GOAL: weight trends, intake   NEXT VISIT: ~6 weeks  Jonas Goh B. Zenia Resides, [place], [place] Registered Dietitian [phone] (mobile)

## 2021-04-02 ENCOUNTER — Telehealth: Payer: Self-pay | Admitting: *Deleted

## 2021-04-02 NOTE — Telephone Encounter (Signed)
Pt called in to report that she has been having worsening vision since December and recently saw her eye doctor who performed an ultrasound of right eye which found a 3.75mm mass. Pt has been referred to eye specialist at [facility] for further evaluation with possible biopsy. Pt wanted to make Dr. B aware of the recent findings.   Also, pt stated that she will be bringing by forms that need to be filled out for disability through her employer. States that her FMLA will expire on 04/09/21 then will transition to disability benefits.

## 2021-04-06 ENCOUNTER — Encounter: Payer: Self-pay | Admitting: *Deleted

## 2021-04-10 ENCOUNTER — Ambulatory Visit: Admit: 2021-04-10 | Payer: 59 | Admitting: Gastroenterology

## 2021-04-10 SURGERY — COLONOSCOPY WITH PROPOFOL
Anesthesia: General

## 2021-04-13 ENCOUNTER — Encounter: Payer: Self-pay | Admitting: Internal Medicine

## 2021-04-15 ENCOUNTER — Inpatient Hospital Stay (HOSPITAL_BASED_OUTPATIENT_CLINIC_OR_DEPARTMENT_OTHER): Payer: 59 | Admitting: Internal Medicine

## 2021-04-15 ENCOUNTER — Encounter: Payer: Self-pay | Admitting: Internal Medicine

## 2021-04-15 ENCOUNTER — Other Ambulatory Visit: Payer: Self-pay

## 2021-04-15 ENCOUNTER — Inpatient Hospital Stay: Payer: 59

## 2021-04-15 ENCOUNTER — Inpatient Hospital Stay: Payer: 59 | Attending: Internal Medicine

## 2021-04-15 VITALS — BP 97/58 | HR 60

## 2021-04-15 DIAGNOSIS — C3432 Malignant neoplasm of lower lobe, left bronchus or lung: Secondary | ICD-10-CM

## 2021-04-15 DIAGNOSIS — I10 Essential (primary) hypertension: Secondary | ICD-10-CM | POA: Diagnosis not present

## 2021-04-15 DIAGNOSIS — R110 Nausea: Secondary | ICD-10-CM | POA: Diagnosis not present

## 2021-04-15 DIAGNOSIS — E785 Hyperlipidemia, unspecified: Secondary | ICD-10-CM | POA: Insufficient documentation

## 2021-04-15 DIAGNOSIS — E119 Type 2 diabetes mellitus without complications: Secondary | ICD-10-CM | POA: Diagnosis not present

## 2021-04-15 DIAGNOSIS — Z5111 Encounter for antineoplastic chemotherapy: Secondary | ICD-10-CM | POA: Insufficient documentation

## 2021-04-15 DIAGNOSIS — Z85850 Personal history of malignant neoplasm of thyroid: Secondary | ICD-10-CM | POA: Diagnosis not present

## 2021-04-15 DIAGNOSIS — Z7984 Long term (current) use of oral hypoglycemic drugs: Secondary | ICD-10-CM | POA: Insufficient documentation

## 2021-04-15 DIAGNOSIS — Z87891 Personal history of nicotine dependence: Secondary | ICD-10-CM | POA: Diagnosis not present

## 2021-04-15 DIAGNOSIS — K219 Gastro-esophageal reflux disease without esophagitis: Secondary | ICD-10-CM | POA: Diagnosis not present

## 2021-04-15 DIAGNOSIS — E039 Hypothyroidism, unspecified: Secondary | ICD-10-CM | POA: Insufficient documentation

## 2021-04-15 DIAGNOSIS — Z79899 Other long term (current) drug therapy: Secondary | ICD-10-CM | POA: Insufficient documentation

## 2021-04-15 DIAGNOSIS — J449 Chronic obstructive pulmonary disease, unspecified: Secondary | ICD-10-CM | POA: Diagnosis not present

## 2021-04-15 DIAGNOSIS — R0789 Other chest pain: Secondary | ICD-10-CM | POA: Diagnosis not present

## 2021-04-15 LAB — CBC WITH DIFFERENTIAL/PLATELET
Abs Immature Granulocytes: 0.02 10*3/uL (ref 0.00–0.07)
Basophils Absolute: 0.1 10*3/uL (ref 0.0–0.1)
Basophils Relative: 1 %
Eosinophils Absolute: 0.2 10*3/uL (ref 0.0–0.5)
Eosinophils Relative: 2 %
HCT: 36.6 % (ref 36.0–46.0)
Hemoglobin: 11.4 g/dL — ABNORMAL LOW (ref 12.0–15.0)
Immature Granulocytes: 0 %
Lymphocytes Relative: 18 %
Lymphs Abs: 1.6 10*3/uL (ref 0.7–4.0)
MCH: 33.8 pg (ref 26.0–34.0)
MCHC: 31.1 g/dL (ref 30.0–36.0)
MCV: 108.6 fL — ABNORMAL HIGH (ref 80.0–100.0)
Monocytes Absolute: 0.8 10*3/uL (ref 0.1–1.0)
Monocytes Relative: 9 %
Neutro Abs: 6.2 10*3/uL (ref 1.7–7.7)
Neutrophils Relative %: 70 %
Platelets: 228 10*3/uL (ref 150–400)
RBC: 3.37 MIL/uL — ABNORMAL LOW (ref 3.87–5.11)
RDW: 16.5 % — ABNORMAL HIGH (ref 11.5–15.5)
WBC: 8.9 10*3/uL (ref 4.0–10.5)
nRBC: 0 % (ref 0.0–0.2)

## 2021-04-15 LAB — COMPREHENSIVE METABOLIC PANEL
ALT: 23 U/L (ref 0–44)
AST: 21 U/L (ref 15–41)
Albumin: 3.5 g/dL (ref 3.5–5.0)
Alkaline Phosphatase: 63 U/L (ref 38–126)
Anion gap: 9 (ref 5–15)
BUN: 16 mg/dL (ref 8–23)
CO2: 26 mmol/L (ref 22–32)
Calcium: 8.8 mg/dL — ABNORMAL LOW (ref 8.9–10.3)
Chloride: 105 mmol/L (ref 98–111)
Creatinine, Ser: 0.74 mg/dL (ref 0.44–1.00)
GFR, Estimated: >60 mL/min (ref >60)
Glucose, Bld: 95 mg/dL (ref 70–99)
Potassium: 3.6 mmol/L (ref 3.5–5.1)
Sodium: 140 mmol/L (ref 135–145)
Total Bilirubin: 0.4 mg/dL (ref 0.3–1.2)
Total Protein: 6.7 g/dL (ref 6.5–8.1)

## 2021-04-15 MED ORDER — SODIUM CHLORIDE 0.9 % IV SOLN
Freq: Once | INTRAVENOUS | Status: AC
  Filled 2021-04-15: qty 250

## 2021-04-15 MED ORDER — SODIUM CHLORIDE 0.9 % IV SOLN: 8.0000 mg | Freq: Once | INTRAVENOUS | Status: DC

## 2021-04-15 MED ORDER — PROCHLORPERAZINE MALEATE 10 MG PO TABS
10.0000 mg | ORAL_TABLET | Freq: Once | ORAL | Status: AC
  Administered 2021-04-15: 10 mg via ORAL
  Filled 2021-04-15: qty 1

## 2021-04-15 MED ORDER — CYANOCOBALAMIN 1000 MCG/ML IJ SOLN
1000.0000 ug | Freq: Once | INTRAMUSCULAR | Status: AC
  Administered 2021-04-15: 1000 ug via INTRAMUSCULAR
  Filled 2021-04-15: qty 1

## 2021-04-15 MED ORDER — HEPARIN SOD (PORK) LOCK FLUSH 100 UNIT/ML IV SOLN
500.0000 [IU] | Freq: Once | INTRAVENOUS | Status: AC | PRN
  Administered 2021-04-15: 500 [IU]
  Filled 2021-04-15: qty 5

## 2021-04-15 MED ORDER — ONDANSETRON HCL 4 MG/2ML IJ SOLN
8.0000 mg | Freq: Once | INTRAMUSCULAR | Status: AC
  Administered 2021-04-15: 8 mg via INTRAVENOUS
  Filled 2021-04-15: qty 4

## 2021-04-15 MED ORDER — SODIUM CHLORIDE 0.9 % IV SOLN
200.0000 mg | Freq: Once | INTRAVENOUS | Status: AC
  Administered 2021-04-15: 200 mg via INTRAVENOUS
  Filled 2021-04-15: qty 200

## 2021-04-15 MED ORDER — SODIUM CHLORIDE 0.9 % IV SOLN
500.0000 mg/m2 | Freq: Once | INTRAVENOUS | Status: AC
  Administered 2021-04-15: 1000 mg via INTRAVENOUS
  Filled 2021-04-15: qty 40

## 2021-04-15 NOTE — Progress Notes (Signed)
[place] CONSULT NOTE  Patient Care Team: Dion Body, MD as PCP - General (Family Medicine) Leata Mouse (Inactive) Byrnett, Forest Gleason, MD (General Surgery) Telford Nab, RN as Oncology Nurse Navigator  CHIEF COMPLAINTS/PURPOSE OF CONSULTATION: lung cancer  Oncology History Overview Note  IMPRESSION: Hypermetabolic solid pulmonary nodule of the left lower lobe, favor primary lung malignancy.   Numerous hypermetabolic nodules of the lower left pleura, compatible with pleural metastatic disease.   Hypermetabolic subcarinal and left hilar lymph nodes, compatible with metastatic disease.   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 Recommend attention on follow-up.   Mild asymmetric FDG uptake below mediastinal blood pool within a small nodule of the left parotid gland, favored to be benign. Recommend attention on follow-up.   No evidence of metastatic disease in the abdomen or pelvis.   No evidence of osseous metastatic disease.   ---------------------  # MARCH 2022- incidental LLL [ 0.9 cm] s/p COVID vaccine; [Dr.Aleskerov]; July 2022- left pleural pain-   #  #October 2022 MRI brain-punctate lesion-asymptomatic [D/w- Dr.Vaslow]; December 2022 MRI negative for any acute process; chronic sclerosis of hippocampus-clinically insignificant.  COPD-non-complaint    ------------------   DIAGNOSIS:  A. PLEURAL MASS, LEFT CHEST; BIOPSY:  - DIAGNOSTIC OF MALIGNANCY.  - NON-SMALL CELL CARCINOMA, FAVOR ADENOCARCINOMA.   Comment:  The tumor cells are positive for CK7 and TTF1 (patchy).  They are  negative for p40.   # 12/10/2020-carbo Alimta; cycle #1 [Keytruda pending insurance]  #NGS PD-L1 TPS 1%   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[place])   05/02/2015 Initial Diagnosis   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HCC)   Cancer of lower lobe of left lung ([place])  12/02/2020 Initial Diagnosis   Cancer of lower lobe of left lung ([place])   12/10/2020 Cancer Staging   Staging form: Lung, AJCC 8th Edition - Clinical: Stage IVA (cT4, cN3, cM1a) - Signed by Cammie Sickle, MD on 12/10/2020    12/10/2020 -  Chemotherapy   Patient is on Treatment Plan : LUNG CARBOplatin / Pemetrexed / Pembrolizumab q21d Induction x 4 cycles / Maintenance Pemetrexed + Pembrolizumab        HISTORY OF PRESENTING ILLNESS: Ambulating independently.  Alone.  Felicia Manning 66 y.o.  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  S/p evaluation with ophthalmologist- worsening vision since December and recently saw her eye doctor who performed an ultrasound of right eye which found a 3.63m mass.   Patient denies any worsening pain.  Appetite is good.  No weight loss.  Patient had mild to moderate nausea after the last round of chemotherapy improved with Zofran.  Intermittent loose stools resolved with Imodium.  No further falls.  Review of Systems  Constitutional:  Positive for malaise/fatigue. Negative for chills, diaphoresis, fever and weight loss.  HENT:  Negative for nosebleeds and sore throat.   Eyes:  Negative for double vision.  Respiratory:  Positive for cough and sputum production. Negative for hemoptysis and shortness of breath.   Cardiovascular:  Negative for palpitations, orthopnea and leg swelling.  Gastrointestinal:  Negative for abdominal pain, blood in stool, constipation, diarrhea, heartburn, melena, nausea and vomiting.  Genitourinary:  Negative for dysuria, frequency and urgency.  Musculoskeletal:  Negative for back pain  and joint pain.  Skin: Negative.  Negative for itching and rash.  Neurological:   Negative for dizziness, tingling, focal weakness and weakness.  Endo/Heme/Allergies:  Does not bruise/bleed easily.  Psychiatric/Behavioral:  Negative for depression. The patient is not nervous/anxious and does not have insomnia.     MEDICAL HISTORY:  Past Medical History:  Diagnosis Date   Barrett's esophagus    Dysrhythmia    stress related at work.   GERD (gastroesophageal reflux disease)    Hepatitis C 2008   Hyperlipidemia    Hypertension    Hypothyroidism    Nephrolithiasis    Non-small cell carcinoma of left lung (HCC)    Osteoporosis    Pulmonary mass    Thyroid cancer ([place]) 2012   Thyroid cancer ([place])     SURGICAL HISTORY: Past Surgical History:  Procedure Laterality Date   ABDOMINAL HYSTERECTOMY  2006   COLONOSCOPY WITH PROPOFOL N/A 04/25/2015   Procedure: COLONOSCOPY WITH PROPOFOL;  Surgeon: Josefine Class, MD;  Location: [facility] ENDOSCOPY;  Service: Endoscopy;  Laterality: N/A;   CYSTOSCOPY W/ RETROGRADES Bilateral 05/19/2015   Procedure: CYSTOSCOPY WITH RETROGRADE PYELOGRAM;  Surgeon: Hollice Espy, MD;  Location: [facility] ORS;  Service: Urology;  Laterality: Bilateral;   CYSTOSCOPY W/ URETERAL STENT PLACEMENT Right 05/26/2015   Procedure: CYSTOSCOPY WITH STENT REPLACEMENT;  Surgeon: Hollice Espy, MD;  Location: [facility] ORS;  Service: Urology;  Laterality: Right;   CYSTOSCOPY WITH STENT PLACEMENT Right 05/19/2015   Procedure: CYSTOSCOPY WITH STENT PLACEMENT;  Surgeon: Hollice Espy, MD;  Location: [facility] ORS;  Service: Urology;  Laterality: Right;   CYSTOSCOPY WITH STENT PLACEMENT Right 05/26/2015   Procedure: CYSTOSCOPY WITH STENT PLACEMENT;  Surgeon: Hollice Espy, MD;  Location: [facility] ORS;  Service: Urology;  Laterality: Right;   CYSTOSCOPY WITH STENT PLACEMENT Right 06/25/2016   Procedure: CYSTOSCOPY WITH STENT PLACEMENT;  Surgeon: Nickie Retort, MD;  Location: [facility] ORS;  Service: Urology;  Laterality: Right;   CYSTOSCOPY WITH STENT PLACEMENT  Left 10/31/2017   Procedure: CYSTOSCOPY WITH STENT PLACEMENT;  Surgeon: Hollice Espy, MD;  Location: [facility] ORS;  Service: Urology;  Laterality: Left;   CYSTOSCOPY/URETEROSCOPY/HOLMIUM LASER/STENT PLACEMENT Left 07/23/2015   Procedure: CYSTOSCOPY/URETEROSCOPY/HOLMIUM LASER/STENT PLACEMENT/RETROGRADE PYELOGRAM;  Surgeon: Hollice Espy, MD;  Location: [facility] ORS;  Service: Urology;  Laterality: Left;   CYSTOSCOPY/URETEROSCOPY/HOLMIUM LASER/STENT PLACEMENT Left 11/16/2017   Procedure: CYSTOSCOPY/URETEROSCOPY/HOLMIUM LASER/STENT Exchange;  Surgeon: Hollice Espy, MD;  Location: [facility] ORS;  Service: Urology;  Laterality: Left;   ESOPHAGOGASTRODUODENOSCOPY (EGD) WITH PROPOFOL N/A 04/25/2015   Procedure: ESOPHAGOGASTRODUODENOSCOPY (EGD) WITH PROPOFOL;  Surgeon: Josefine Class, MD;  Location: [facility] ENDOSCOPY;  Service: Endoscopy;  Laterality: N/A;   EYE SURGERY Bilateral 1964   lazy eye repair   EYE SURGERY Bilateral 2001   lazy eye repair   IR IMAGING GUIDED PORT INSERTION  12/08/2020   LITHOTRIPSY  2009   THYROIDECTOMY  2010   URETEROSCOPY WITH HOLMIUM LASER LITHOTRIPSY Right 05/19/2015   Procedure: URETEROSCOPY WITH HOLMIUM LASER LITHOTRIPSY;  Surgeon: Hollice Espy, MD;  Location: [facility] ORS;  Service: Urology;  Laterality: Right;   URETEROSCOPY WITH HOLMIUM LASER LITHOTRIPSY Right 05/26/2015   Procedure: URETEROSCOPY WITH HOLMIUM LASER LITHOTRIPSY;  Surgeon: Hollice Espy, MD;  Location: [facility] ORS;  Service: Urology;  Laterality: Right;   URETEROSCOPY WITH HOLMIUM LASER LITHOTRIPSY Right 06/25/2016   Procedure: URETEROSCOPY WITH HOLMIUM LASER LITHOTRIPSY;  Surgeon: Nickie Retort, MD;  Location: [facility] ORS;  Service: Urology;  Laterality: Right;    SOCIAL HISTORY: Social History   Socioeconomic History  Marital status: Divorced    Spouse name: Not on file   Number of children: Not on file   Years of education: Not on file   Highest education level: Not on file  Occupational  History   Not on file  Tobacco Use   Smoking status: Every Day    Packs/day: 1.00    Years: 40.00    Pack years: 40.00    Types: Cigarettes   Smokeless tobacco: Never  Vaping Use   Vaping Use: Every day  Substance and Sexual Activity   Alcohol use: Not Currently    Comment: socially   Drug use: No   Sexual activity: Not Currently    Birth control/protection: None  Other Topics Concern   Not on file  Social History Narrative   Smoking: 1p 1-2 days since 17 years; alcohol: rare; 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     Social Determinants of Health   Financial Resource Strain: Not on file  Food Insecurity: Not on file  Transportation Needs: Not on file  Physical Activity: Not on file  Stress: Not on file  Social Connections: Not on file  Intimate Partner Violence: Not on file    FAMILY HISTORY: Family History  Problem Relation Age of Onset   CAD Mother    Heart disease Mother    Dementia Father    Bladder Cancer Neg Hx    Prostate cancer Neg Hx    Kidney cancer Neg Hx    Breast cancer Neg Hx     ALLERGIES:  has No Known Allergies.  MEDICATIONS:  Current Outpatient Medications  Medication Sig Dispense Refill   Accu-Chek Softclix Lancets lancets Check glucose once a day 100 each 12   albuterol (VENTOLIN HFA) 108 (90 Base) MCG/ACT inhaler Inhale 2 puffs into the lungs every 6 (six) hours as needed for wheezing or shortness of breath. 8 g 2   amLODipine (NORVASC) 5 MG tablet Take 5 mg by mouth every morning.      blood glucose meter kit and supplies KIT Dispense based on patient and insurance preference. Use up to three times daily as directed. 1 each 0   cholecalciferol (VITAMIN D) 1000 units tablet Take 2,000 Units by mouth daily.     Cranberry-Vitamin C-Probiotic (AZO CRANBERRY PO) Take 1 tablet by mouth daily.     fluticasone-salmeterol (ADVAIR HFA) 45-21 MCG/ACT  inhaler Inhale 2 puffs into the lungs 2 (two) times daily. 1 each 12   folic acid (FOLVITE) 1 MG tablet Take 1 tablet (1 mg total) by mouth daily. 90 tablet 1   glipiZIDE (GLUCOTROL) 5 MG tablet Take 1 tablet (5 mg total) by mouth 2 (two) times daily before a meal. 180 tablet 1   HYDROcodone-acetaminophen (NORCO/VICODIN) 5-325 MG tablet Take 1 tablet by mouth every 8 (eight) hours as needed for moderate pain. 60 tablet 0   levothyroxine (SYNTHROID) 175 MCG tablet Sat and Sun ON AN EMPTY STOMACH WITH A GLASS OF WATER AT LEAST 30-60 MINUTES BEFORE BREAKFAST     levothyroxine (SYNTHROID, LEVOTHROID) 150 MCG tablet Take 150 mcg by mouth at bedtime.      lidocaine-prilocaine (EMLA) cream Apply 1 application topically as needed. 30 g 0   losartan (COZAAR) 100 MG tablet Take 100 mg by mouth every morning.      metoprolol succinate (TOPROL-XL) 25 MG 24 hr tablet Take 25 mg by mouth every morning.   1   nystatin (MYCOSTATIN) 100000  UNIT/ML suspension Swish and swallow. Don't drink fluids immediately following administration. 400 mL 0   omeprazole (PRILOSEC) 20 MG capsule Take 20 mg by mouth daily before breakfast.      ondansetron (ZOFRAN) 8 MG tablet Take 1 tablet (8 mg total) by mouth every 8 (eight) hours as needed for nausea or vomiting. Start on the third day after chemotherapy 30 tablet 2   prochlorperazine (COMPAZINE) 10 MG tablet Take 1 tablet (10 mg total) by mouth every 6 (six) hours as needed for nausea or vomiting. 30 tablet 2   amoxicillin-clavulanate (AUGMENTIN) 875-125 MG tablet Take 1 tablet by mouth 2 (two) times daily. (Patient not taking: No sig reported)     atorvastatin (LIPITOR) 80 MG tablet Take 80 mg by mouth daily. (Patient not taking: Reported on 03/25/2021)     dexamethasone (DECADRON) 4 MG tablet Take 1 tablet by mouth twice a day the day before chemotherapy (Patient not taking: Reported on 03/04/2021) 30 tablet 0   lidocaine (LIDODERM) 5 % 1 patch daily. (Patient not  taking: Reported on 03/25/2021)     predniSONE (DELTASONE) 10 MG tablet Take 10 mg by mouth daily. (Patient not taking: Reported on 12/31/2020)     No current facility-administered medications for this visit.   Facility-Administered Medications Ordered in Other Visits  Medication Dose Route Frequency Provider Last Rate Last Admin   heparin lock flush 100 unit/mL  500 Units Intracatheter Once PRN Cammie Sickle, MD       pembrolizumab [facility]) 200 mg in sodium chloride 0.9 % 50 mL chemo infusion  200 mg Intravenous Once Charlaine Dalton R, MD       PEMEtrexed (ALIMTA) 1,000 mg in sodium chloride 0.9 % 100 mL chemo infusion  500 mg/m2 (Treatment Plan Recorded) Intravenous Once Cammie Sickle, MD          .  PHYSICAL EXAMINATION: ECOG PERFORMANCE STATUS: 1 - Symptomatic but completely ambulatory  Vitals:   04/15/21 0854  BP: 114/74  Pulse: 76  Temp: (!) 97.3 F (36.3 C)  SpO2: 97%    Filed Weights   04/15/21 0854  Weight: 190 lb (86.2 kg)   No significant right upper quadrant tenderness noted.  Physical Exam Vitals and nursing note reviewed.  HENT:     Head: Normocephalic and atraumatic.     Mouth/Throat:     Pharynx: Oropharynx is clear.  Eyes:     Extraocular Movements: Extraocular movements intact.     Pupils: Pupils are equal, round, and reactive to light.  Cardiovascular:     Rate and Rhythm: Normal rate and regular rhythm.  Pulmonary:     Comments: Decreased breath sounds bilaterally.  Abdominal:     Palpations: Abdomen is soft.  Musculoskeletal:        General: Normal range of motion.     Cervical back: Normal range of motion.  Skin:    General: Skin is warm.  Neurological:     General: No focal deficit present.     Mental Status: She is alert and oriented to person, place, and time.  Psychiatric:        Behavior: Behavior normal.        Judgment: Judgment normal.     LABORATORY DATA:  I have reviewed the data as listed Lab  Results  Component Value Date   WBC 8.9 04/15/2021   HGB 11.4 (L) 04/15/2021   HCT 36.6 04/15/2021   MCV 108.6 (H) 04/15/2021   PLT 228 04/15/2021  Recent Labs    03/04/21 0833 03/25/21 0838 04/15/21 0837  NA 141 139 140  K 4.0 3.5 3.6  CL 105 106 105  CO2 _0 GLUCOSE 125* 132* 95  BUN _1 CREATININE 0.62 0.68 0.74  CALCIUM 8.7* 8.7* 8.8*  GFRNONAA >60 >60 >60  PROT 7.1 6.6 6.7  ALBUMIN 3.8 3.5 3.5  AST _2 ALT _3 ALKPHOS 71 62 63  BILITOT 0.5 0.2* 0.4    RADIOGRAPHIC STUDIES: I have personally reviewed the radiological images as listed and agreed with the findings in the report. CT CHEST ABDOMEN PELVIS W CONTRAST  Result Date: 03/23/2021 CLINICAL DATA:  Metastatic left lower lobe non-small cell lung cancer restaging, ongoing chemotherapy and immunotherapy EXAM: CT CHEST, ABDOMEN, AND PELVIS WITH CONTRAST TECHNIQUE: Multidetector CT imaging of the chest, abdomen and pelvis was performed following the standard protocol during bolus administration of intravenous contrast. RADIATION DOSE REDUCTION: This exam was performed according to the departmental dose-optimization program which includes automated exposure control, adjustment of the mA and/or kV according to patient size and/or use of iterative reconstruction technique. CONTRAST:  114m OMNIPAQUE IOHEXOL 300 MG/ML SOLN, additional oral enteric contrast COMPARISON:  CT chest, 01/16/2021, PET-CT, 11/06/2020, CT abdomen pelvis, 10/23/2020, 12/06/2017 FINDINGS: CT CHEST FINDINGS Cardiovascular: Right chest port catheter. Aortic atherosclerosis. 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 Normal heart size. Scattered left and right coronary artery calcifications. No pericardial effusion. Mediastinum/Nodes: Interval decrease in size of enlarged left hilar and subcarinal nodes, left hilar nodes no longer discretely appreciated (series 2, image 30), subcarinal node measuring  1.9 x 0.9 cm, previously 2.4 x 1.3 cm when measured similarly (series 2, image 26). Small hiatal hernia. Thyroid gland, trachea, and esophagus demonstrate no significant findings. Lungs/Pleura: Diffuse bilateral bronchial wall thickening. Interval decrease in size of a spiculated nodule of left lower lobe, in the inferior costophrenic recess, measuring 1.6 x 1.2 cm, previously 2.2 x 1.6 cm (series 3, image 122). Previously hypermetabolic pleural nodules about the left lower lobe are significantly diminished, now measuring no greater than 1-2 mm in thickness (series 2, image 48, 40). Numerous scattered, generally amorphous ground-glass opacities throughout the lungs are similar to prior examination (series 3, image 34, 41, 55, 79). Musculoskeletal: No chest wall mass or suspicious osseous lesions identified. There is a subacute, partially callused fracture of the lateral left ninth rib (series 3, image 132). CT ABDOMEN PELVIS FINDINGS Hepatobiliary: Hepatomegaly, maximum coronal span 20.8 cm, and hepatic steatosis. Unchanged hyperenhancing focus adjacent to the gallbladder fossa, hepatic segment V, measuring 1.0 cm and stable on examinations dating back to at least 2019, consistent with a benign flash filling hemangioma or perhaps a small focal nodular hyperplasia (series 2, image 62). Benign cyst of the right lobe of the liver, hepatic segment V (series 2, image 56). Faintly calcified gallstones in the gallbladder. No gallbladder wall thickening, or biliary dilatation. Pancreas: Unremarkable. No pancreatic ductal dilatation or surrounding inflammatory changes. Spleen: Normal in size without significant abnormality. Adrenals/Urinary Tract: Adrenal glands are unremarkable. Multiple small bilateral nonobstructive renal calculi. No ureteral calculi or hydronephrosis. Multiple bilateral benign simple exophytic cysts. Bladder is unremarkable. Stomach/Bowel: Stomach is within normal limits. Incidental diverticulum of the  descending duodenum. Appendix appears normal. No evidence of bowel wall thickening, distention, or inflammatory changes. Descending and sigmoid diverticulosis. Vascular/Lymphatic: Aortic atherosclerosis. No enlarged abdominal or pelvic lymph nodes. Reproductive: Status post hysterectomy. Unchanged, benign simple cyst measuring  3.3 x 2.5 cm overlying the right psoas musculature, likely a postoperative seroma or lymphocele following hysterectomy and oophorectomy, as normal ovarian tissue is not seen. Other: No abdominal wall hernia or abnormality. No ascites. Musculoskeletal: No acute osseous findings. IMPRESSION: 1. Interval decrease in size of a previously hypermetabolic spiculated nodule of left lower lobe. 2. Previously hypermetabolic pleural nodules about the left lower lobe are significantly diminished, now measuring no greater than 1-2 mm in thickness. 3. Interval decrease in size of enlarged left hilar and subcarinal nodes. 4. Constellation of findings is consistent with treatment response of primary lung malignancy with pleural and nodal metastatic disease. 5. Numerous scattered, generally amorphous ground-glass opacities throughout the lungs are similar to prior examination, these remain nonspecific and likely infectious or inflammatory in nature. 6. Diffuse bilateral bronchial wall thickening, consistent with nonspecific infectious or inflammatory bronchitis. 7. Hepatomegaly and hepatic steatosis. 8. Cholelithiasis. 9. Nonobstructive bilateral nephrolithiasis. 10. Coronary artery disease. Aortic Atherosclerosis (ICD10-I70.0). Electronically Signed   By: Delanna Ahmadi M.D.   On: 03/23/2021 14:54    ASSESSMENT & PLAN:   Cancer of lower lobe of left lung ([place]) #Stage IV -non-small cell favor adenocarcinoma; on carbo-alita-keytruda #2-FEB 3rd, 2023-continued improved left lower lobe lung nodule/pleural-based nodules/hilar and subcarinal lymphadenopathy.   #Proceed with  maintaintnance  Alimta Keytruda  today Labs today reviewed;mild anemia- Hb 10-11;   acceptable for treatment today.   # Left chest wall pain:-improved/resolved-using hydrocodone qhs- STABLE.   #Right eye vision changes-3.6 mm lesion noted in the right eye; awaiting ophthalmology evaluation at [facility].   # COPD [Dr.A]: continue adviar/ abulterol prn. STABLE.    # Diabetes-A1c 6.9 [AUG 2022]-Fatsing-95.  Continue glipizide 5 mg BID. STABLE.   # Diarrhea-G-1-on immoiudm/ Oral mucositis- G-1-STABLE.   # Nausea- G-1 [id number] dex]-we will add Zofran with subsequent visits- STABLE. .  # Mediport placement-s/p dye study c tPA-functioning.   B12 inj-2/08   #DISPOSITION: # Chemo today # 3 week- MD- labs- cbc/cmp; - Alimta-keytruda; Dr.B            All questions were answered. The patient knows to call the clinic with any problems, questions or concerns.       Cammie Sickle, MD 04/15/2021 10:22 AM

## 2021-04-15 NOTE — Patient Instructions (Signed)
[facility]  Discharge Instructions: °Thank you for choosing [facility] to provide your oncology and hematology care.  ° °If you have a lab appointment with the Cancer Center, please go directly to the Cancer Center and check in at the registration area. °  °Wear comfortable clothing and clothing appropriate for easy access to any Portacath or PICC line.  ° °We strive to give you quality time with your provider. You may need to reschedule your appointment if you arrive late (15 or more minutes).  Arriving late affects you and other patients whose appointments are after yours.  Also, if you miss three or more appointments without notifying the office, you may be dismissed from the clinic at the provider’s discretion.    °  °For prescription refill requests, have your pharmacy contact our office and allow 72 hours for refills to be completed.   ° °Today you received the following chemotherapy and/or immunotherapy agents     °  °To help prevent nausea and vomiting after your treatment, we encourage you to take your nausea medication as directed. ° °BELOW ARE SYMPTOMS THAT SHOULD BE REPORTED IMMEDIATELY: °*FEVER GREATER THAN 100.4 F (38 °C) OR HIGHER °*CHILLS OR SWEATING °*NAUSEA AND VOMITING THAT IS NOT CONTROLLED WITH YOUR NAUSEA MEDICATION °*UNUSUAL SHORTNESS OF BREATH °*UNUSUAL BRUISING OR BLEEDING °*URINARY PROBLEMS (pain or burning when urinating, or frequent urination) °*BOWEL PROBLEMS (unusual diarrhea, constipation, pain near the anus) °TENDERNESS IN MOUTH AND THROAT WITH OR WITHOUT PRESENCE OF ULCERS (sore throat, sores in mouth, or a toothache) °UNUSUAL RASH, SWELLING OR PAIN  °UNUSUAL VAGINAL DISCHARGE OR ITCHING  ° °Items with * indicate a potential emergency and should be followed up as soon as possible or go to the Emergency Department if any problems should occur. ° °Please show the CHEMOTHERAPY ALERT CARD or IMMUNOTHERAPY ALERT CARD at check-in to the  Emergency Department and triage nurse. ° °Should you have questions after your visit or need to cancel or reschedule your appointment, please contact [facility]  Dept: [phone]  and follow the prompts.  Office hours are 8:00 a.m. to 4:30 p.m. Monday - Friday. Please note that voicemails left after 4:00 p.m. may not be returned until the following business day.  We are closed weekends and major holidays. You have access to a nurse at all times for urgent questions. Please call the main number to the clinic Dept: [phone] and follow the prompts. ° ° °For any non-urgent questions, you may also contact your provider using MyChart. We now offer e-Visits for anyone 18 and older to request care online for non-urgent symptoms. For details visit [email or web address]. °  °Also download the MyChart app! Go to the app store, search "MyChart", open the app, select [place], and log in with your MyChart username and password. ° °Due to Covid, a mask is required upon entering the hospital/clinic. If you do not have a mask, one will be given to you upon arrival. For doctor visits, patients may have 1 support person aged 18 or older with them. For treatment visits, patients cannot have anyone with them due to current Covid guidelines and our immunocompromised population.  ° °

## 2021-04-15 NOTE — Progress Notes (Signed)
Pt sees an ocular oncologist next week for a mass behind her right eye. ?

## 2021-04-15 NOTE — Assessment & Plan Note (Addendum)
#  Stage IV -non-small cell favor adenocarcinoma; on carbo-alita-keytruda #2-FEB 3rd, 2023-continued improved left lower lobe lung nodule/pleural-based nodules/hilar and subcarinal lymphadenopathy.  ? ?#Proceed with  maintaintnance  Alimta Keytruda today Labs today reviewed;mild anemia- Hb 10-11;   acceptable for treatment today.  ? ?# Left chest wall pain:-improved/resolved-using hydrocodone qhs- STABLE.  ? ?#Right eye vision changes-3.6 mm lesion noted in the right eye; awaiting ophthalmology evaluation at [facility].  ? ?# COPD [Dr.A]: continue adviar/ abulterol prn. STABLE.   ? ?# Diabetes-A1c 6.9 [AUG 2022]-Fatsing-95.  Continue glipizide 5 mg BID. STABLE.  ? ?# Diarrhea-G-1-on immoiudm/ Oral mucositis- G-1-STABLE.  ? ?# Nausea- G-1 [off dex]-we will add Zofran with subsequent visits- STABLE. . ? ?# Mediport placement-s/p dye study c tPA-functioning. ? ? B12 inj-2/08  ? ?#DISPOSITION: ?# Chemo today ?# 3 week- MD- labs- cbc/cmp; - Alimta-keytruda; Dr.B ? ? ? ? ? ? ?? ?

## 2021-04-21 ENCOUNTER — Telehealth: Payer: Self-pay | Admitting: *Deleted

## 2021-04-21 DIAGNOSIS — R29898 Other symptoms and signs involving the musculoskeletal system: Secondary | ICD-10-CM

## 2021-04-21 DIAGNOSIS — C3432 Malignant neoplasm of lower lobe, left bronchus or lung: Secondary | ICD-10-CM

## 2021-04-21 NOTE — Telephone Encounter (Signed)
Email received from patient requesting order be sent for rolling walker with seat as well as cane to help treat weakness of both lower extremities. Orders placed and [employer] rep notified with request.  ?

## 2021-04-21 NOTE — Telephone Encounter (Signed)
04/20/21-Received incoming [place] form- [employer]. Form completed- pending Dr. Sharmaine Base signature. ?

## 2021-04-22 ENCOUNTER — Encounter: Payer: Self-pay | Admitting: Internal Medicine

## 2021-04-22 NOTE — Telephone Encounter (Signed)
Dr. Rogue Bussing signed the form for [employer] I faxed the form - fax confirmation [email or web address]. ?

## 2021-04-27 ENCOUNTER — Telehealth: Payer: 59 | Admitting: Hospice and Palliative Medicine

## 2021-04-28 ENCOUNTER — Other Ambulatory Visit: Payer: Self-pay

## 2021-04-28 ENCOUNTER — Inpatient Hospital Stay (HOSPITAL_BASED_OUTPATIENT_CLINIC_OR_DEPARTMENT_OTHER): Payer: 59 | Admitting: Hospice and Palliative Medicine

## 2021-04-28 DIAGNOSIS — R531 Weakness: Secondary | ICD-10-CM | POA: Diagnosis not present

## 2021-04-28 DIAGNOSIS — C3432 Malignant neoplasm of lower lobe, left bronchus or lung: Secondary | ICD-10-CM

## 2021-04-28 MED ORDER — ONDANSETRON HCL 8 MG PO TABS: 8.0000 mg | ORAL_TABLET | Freq: Three times a day (TID) | ORAL | 2 refills | Status: DC | PRN

## 2021-04-28 MED ORDER — PROCHLORPERAZINE MALEATE 10 MG PO TABS: 10.0000 mg | ORAL_TABLET | Freq: Four times a day (QID) | ORAL | 2 refills | Status: DC | PRN

## 2021-04-28 NOTE — Progress Notes (Signed)
Virtual Visit via Telephone Note ? ?I connected with Felicia Manning on 04/28/21 at 11:30 AM EDT by telephone and verified that I am speaking with the correct person using two identifiers. ? ?Location: ?Patient: Home ?Provider: Clinic ?  ?I discussed the limitations, risks, security and privacy concerns of performing an evaluation and management service by telephone and the availability of in person appointments. I also discussed with the patient that there may be a patient responsible charge related to this service. The patient expressed understanding and agreed to proceed. ? ? ?History of Present Illness: ?Felicia Manning is a 66 y.o. female with multiple medical problems including including COPD, diabetes, and stage IV non-small cell lung cancer, initially diagnosed October 2022, on chemotherapy/immunotherapy.  Patient was referred to palliative care to discuss goals and provide ongoing support for symptoms. ?  ?Observations/Objective: ?I called and spoke with patient by phone.  She reports she is doing reasonably well.  She denies significant symptomatic concerns at present.  She was seen recently by [facility] ophthalmology due to recently discovered eye tumor.  Unclear what the treatment plan is at the present time. ? ?Patient reports that she has completed ACP documents.  She has a signed MOST form at home. ? ?Patient does remain weak and is requesting a shower chair.  Order sent to [employer]. ? ?Assessment and Plan: ?Stage IV NSCLC - on systemic treatment ? ?Weakness - Shower chair ? ?Neoplasm related pain -significantly improved.  Patient sparingly using Norco ? ? ? ?Follow Up Instructions: ?Telephone visit 2-3 months ?  ?I discussed the assessment and treatment plan with the patient. The patient was provided an opportunity to ask questions and all were answered. The patient agreed with the plan and demonstrated an understanding of the instructions. ?  ?The patient was advised to call back or seek an  in-person evaluation if the symptoms worsen or if the condition fails to improve as anticipated. ? ?I provided 10 minutes of non-face-to-face time during this encounter. ? ? ?Irean Hong, NP ? ? ?

## 2021-05-03 ENCOUNTER — Other Ambulatory Visit: Payer: Self-pay | Admitting: Internal Medicine

## 2021-05-06 ENCOUNTER — Encounter: Payer: Self-pay | Admitting: Nurse Practitioner

## 2021-05-06 ENCOUNTER — Inpatient Hospital Stay: Payer: 59

## 2021-05-06 ENCOUNTER — Other Ambulatory Visit: Payer: Self-pay

## 2021-05-06 ENCOUNTER — Inpatient Hospital Stay (HOSPITAL_BASED_OUTPATIENT_CLINIC_OR_DEPARTMENT_OTHER): Payer: 59 | Admitting: Nurse Practitioner

## 2021-05-06 VITALS — BP 124/70 | HR 69 | Temp 98.7°F | Resp 20 | Wt 191.0 lb

## 2021-05-06 DIAGNOSIS — E538 Deficiency of other specified B group vitamins: Secondary | ICD-10-CM

## 2021-05-06 DIAGNOSIS — T451X5A Adverse effect of antineoplastic and immunosuppressive drugs, initial encounter: Secondary | ICD-10-CM | POA: Diagnosis not present

## 2021-05-06 DIAGNOSIS — C3432 Malignant neoplasm of lower lobe, left bronchus or lung: Secondary | ICD-10-CM

## 2021-05-06 DIAGNOSIS — Z5112 Encounter for antineoplastic immunotherapy: Secondary | ICD-10-CM

## 2021-05-06 DIAGNOSIS — R110 Nausea: Secondary | ICD-10-CM | POA: Diagnosis not present

## 2021-05-06 DIAGNOSIS — Z5111 Encounter for antineoplastic chemotherapy: Secondary | ICD-10-CM | POA: Diagnosis not present

## 2021-05-06 LAB — COMPREHENSIVE METABOLIC PANEL
ALT: 25 U/L (ref 0–44)
AST: 25 U/L (ref 15–41)
Albumin: 3.6 g/dL (ref 3.5–5.0)
Alkaline Phosphatase: 63 U/L (ref 38–126)
Anion gap: 9 (ref 5–15)
BUN: 14 mg/dL (ref 8–23)
CO2: 27 mmol/L (ref 22–32)
Calcium: 8.6 mg/dL — ABNORMAL LOW (ref 8.9–10.3)
Chloride: 103 mmol/L (ref 98–111)
Creatinine, Ser: 0.9 mg/dL (ref 0.44–1.00)
GFR, Estimated: >60 mL/min (ref >60)
Glucose, Bld: 127 mg/dL — ABNORMAL HIGH (ref 70–99)
Potassium: 3.6 mmol/L (ref 3.5–5.1)
Sodium: 139 mmol/L (ref 135–145)
Total Bilirubin: <0.1 mg/dL — ABNORMAL LOW (ref 0.3–1.2)
Total Protein: 6.7 g/dL (ref 6.5–8.1)

## 2021-05-06 LAB — CBC WITH DIFFERENTIAL/PLATELET
Abs Immature Granulocytes: 0.02 10*3/uL (ref 0.00–0.07)
Basophils Absolute: 0.1 10*3/uL (ref 0.0–0.1)
Basophils Relative: 1 %
Eosinophils Absolute: 0.2 10*3/uL (ref 0.0–0.5)
Eosinophils Relative: 3 %
HCT: 36.3 % (ref 36.0–46.0)
Hemoglobin: 11.2 g/dL — ABNORMAL LOW (ref 12.0–15.0)
Immature Granulocytes: 0 %
Lymphocytes Relative: 26 %
Lymphs Abs: 2.1 10*3/uL (ref 0.7–4.0)
MCH: 32.9 pg (ref 26.0–34.0)
MCHC: 30.9 g/dL (ref 30.0–36.0)
MCV: 106.8 fL — ABNORMAL HIGH (ref 80.0–100.0)
Monocytes Absolute: 0.9 10*3/uL (ref 0.1–1.0)
Monocytes Relative: 12 %
Neutro Abs: 4.6 10*3/uL (ref 1.7–7.7)
Neutrophils Relative %: 58 %
Platelets: 218 10*3/uL (ref 150–400)
RBC: 3.4 MIL/uL — ABNORMAL LOW (ref 3.87–5.11)
RDW: 15.2 % (ref 11.5–15.5)
WBC: 7.9 10*3/uL (ref 4.0–10.5)
nRBC: 0 % (ref 0.0–0.2)

## 2021-05-06 MED ORDER — SODIUM CHLORIDE 0.9% FLUSH
10.0000 mL | Freq: Once | INTRAVENOUS | Status: AC
  Administered 2021-05-06: 10 mL via INTRAVENOUS
  Filled 2021-05-06: qty 10

## 2021-05-06 MED ORDER — CYANOCOBALAMIN 1000 MCG/ML IJ SOLN
1000.0000 ug | Freq: Once | INTRAMUSCULAR | Status: AC
  Administered 2021-05-06: 1000 ug via INTRAMUSCULAR
  Filled 2021-05-06: qty 1

## 2021-05-06 MED ORDER — SODIUM CHLORIDE 0.9 % IV SOLN: 8.0000 mg | Freq: Once | INTRAVENOUS | Status: DC

## 2021-05-06 MED ORDER — HEPARIN SOD (PORK) LOCK FLUSH 100 UNIT/ML IV SOLN
500.0000 [IU] | Freq: Once | INTRAVENOUS | Status: AC
  Administered 2021-05-06: 500 [IU] via INTRAVENOUS
  Filled 2021-05-06: qty 5

## 2021-05-06 MED ORDER — SODIUM CHLORIDE 0.9 % IV SOLN
200.0000 mg | Freq: Once | INTRAVENOUS | Status: AC
  Administered 2021-05-06: 200 mg via INTRAVENOUS
  Filled 2021-05-06: qty 200

## 2021-05-06 MED ORDER — ONDANSETRON HCL 4 MG/2ML IJ SOLN
8.0000 mg | Freq: Once | INTRAMUSCULAR | Status: AC
  Administered 2021-05-06: 8 mg via INTRAVENOUS
  Filled 2021-05-06: qty 4

## 2021-05-06 MED ORDER — HEPARIN SOD (PORK) LOCK FLUSH 100 UNIT/ML IV SOLN
500.0000 [IU] | Freq: Once | INTRAVENOUS | Status: DC | PRN
  Filled 2021-05-06: qty 5

## 2021-05-06 MED ORDER — PROCHLORPERAZINE MALEATE 10 MG PO TABS
10.0000 mg | ORAL_TABLET | Freq: Once | ORAL | Status: AC
  Administered 2021-05-06: 10 mg via ORAL
  Filled 2021-05-06: qty 1

## 2021-05-06 MED ORDER — SODIUM CHLORIDE 0.9 % IV SOLN
500.0000 mg/m2 | Freq: Once | INTRAVENOUS | Status: AC
  Administered 2021-05-06: 1000 mg via INTRAVENOUS
  Filled 2021-05-06: qty 40

## 2021-05-06 MED ORDER — SODIUM CHLORIDE 0.9 % IV SOLN
10.0000 mg | Freq: Once | INTRAVENOUS | Status: AC
  Administered 2021-05-06: 10 mg via INTRAVENOUS
  Filled 2021-05-06: qty 10

## 2021-05-06 MED ORDER — SODIUM CHLORIDE 0.9 % IV SOLN
Freq: Once | INTRAVENOUS | Status: AC
  Filled 2021-05-06: qty 250

## 2021-05-06 MED ORDER — HEPARIN SOD (PORK) LOCK FLUSH 100 UNIT/ML IV SOLN
INTRAVENOUS | Status: AC
  Filled 2021-05-06: qty 5

## 2021-05-06 NOTE — Patient Instructions (Signed)
[facility] CANCER CTR AT [place]  Discharge Instructions: ?Thank you for choosing [facility] to provide your oncology and hematology care.  ?If you have a lab appointment with the [facility], please go directly to the [facility] and check in at the registration area. ? ?Wear comfortable clothing and clothing appropriate for easy access to any Portacath or PICC line.  ? ?We strive to give you quality time with your provider. You may need to reschedule your appointment if you arrive late (15 or more minutes).  Arriving late affects you and other patients whose appointments are after yours.  Also, if you miss three or more appointments without notifying the office, you may be dismissed from the clinic at the provider?s discretion.    ?  ?For prescription refill requests, have your pharmacy contact our office and allow 72 hours for refills to be completed.   ? ?Today you received the following chemotherapy and/or immunotherapy agents Alimta and Keytruda  ?    ?  ?To help prevent nausea and vomiting after your treatment, we encourage you to take your nausea medication as directed. ? ?BELOW ARE SYMPTOMS THAT SHOULD BE REPORTED IMMEDIATELY: ?*FEVER GREATER THAN 100.4 F (38 ?C) OR HIGHER ?*CHILLS OR SWEATING ?*NAUSEA AND VOMITING THAT IS NOT CONTROLLED WITH YOUR NAUSEA MEDICATION ?*UNUSUAL SHORTNESS OF BREATH ?*UNUSUAL BRUISING OR BLEEDING ?*URINARY PROBLEMS (pain or burning when urinating, or frequent urination) ?*BOWEL PROBLEMS (unusual diarrhea, constipation, pain near the anus) ?TENDERNESS IN MOUTH AND THROAT WITH OR WITHOUT PRESENCE OF ULCERS (sore throat, sores in mouth, or a toothache) ?UNUSUAL RASH, SWELLING OR PAIN  ?UNUSUAL VAGINAL DISCHARGE OR ITCHING  ? ?Items with * indicate a potential emergency and should be followed up as soon as possible or go to the Emergency Department if any problems should occur. ? ?Please show the CHEMOTHERAPY ALERT CARD or IMMUNOTHERAPY ALERT CARD at  check-in to the Emergency Department and triage nurse. ? ?Should you have questions after your visit or need to cancel or reschedule your appointment, please contact [facility] CANCER [place] AT [place]  [phone] and follow the prompts.  Office hours are 8:00 a.m. to 4:30 p.m. Monday - Friday. Please note that voicemails left after 4:00 p.m. may not be returned until the following business day.  We are closed weekends and major holidays. You have access to a nurse at all times for urgent questions. Please call the main number to the clinic [phone] and follow the prompts. ? ?For any non-urgent questions, you may also contact your provider using MyChart. We now offer e-Visits for anyone 64 and older to request care online for non-urgent symptoms. For details visit [email or web address]. ?  ?Also download the MyChart app! Go to the app store, search "MyChart", open the app, select [facility], and log in with your MyChart username and password. ? ?Due to Covid, a mask is required upon entering the hospital/clinic. If you do not have a mask, one will be given to you upon arrival. For doctor visits, patients may have 1 support person aged 64 or older with them. For treatment visits, patients cannot have anyone with them due to current Covid guidelines and our immunocompromised population.  ?

## 2021-05-06 NOTE — Progress Notes (Signed)
Nutrition Follow-up: ? ?Patient with lung cancer, stage IV.  Patient receiving keytruda and alimta. ? ?Met with patient during infusion.  Patient reports that her appetite is good.  Usually has nausea the week of treatment.  Hoping this will be improved with adding dexamethasone back to treatment today.   ? ? ? ?Medications: reviewed ? ?Labs: reviewed ? ?Anthropometrics:  ? ?Weight 191 lb today ? ?189 lb 12.8 oz on 2/8 ?190 lb 12/8 oz on 1/18 ?190 lb on 12/7 ?189 lb on 11/16 ?195 lb on 10/26 ? ? ? ?NUTRITION DIAGNOSIS: Food and beverage knowledge improved ? ? ?INTERVENTION:  ?Continue well balanced diet including good sources of protein ?  ? ?MONITORING, EVALUATION, GOAL: weigh trends, intake ? ? ?NEXT VISIT: as needed ? ?Gregoire Bennis B. Zenia Resides, RD, LDN ?Registered Dietitian ?[phone] ? ? ?

## 2021-05-06 NOTE — Progress Notes (Signed)
[facility] [place] ?CONSULT NOTE ? ?Patient Care Team: ?Dion Body, MD as PCP - General (Family Medicine) ?Dallie Dad K (Inactive) ?Robert Bellow, MD (General Surgery) ?Telford Nab, RN as Sales executive ? ?CHIEF COMPLAINTS/PURPOSE OF CONSULTATION: lung cancer ? ?Oncology History Overview Note  ?IMPRESSION: ?Hypermetabolic solid pulmonary nodule of the left lower lobe, favor ?primary lung malignancy. ?  ?Numerous hypermetabolic nodules of the lower left pleura, compatible ?with pleural metastatic disease. ?  ?Hypermetabolic subcarinal and left hilar lymph nodes, compatible ?with metastatic disease. ?  ?Additional bilateral subsolid pulmonary nodules are seen which are ?unchanged in size compared to prior chest CT dated June 06, 2019 ?and too small to characterize for FDG avidity, concerning for ?multifocal indolent lung adenocarcinoma. Recommend attention on ?follow-up. ?  ?Mild asymmetric FDG uptake below mediastinal blood pool within a ?small nodule of the left parotid gland, favored to be benign. ?Recommend attention on follow-up. ?  ?No evidence of metastatic disease in the abdomen or pelvis. ?  ?No evidence of osseous metastatic disease. ?  ?--------------------- ? ?# MARCH 2022- incidental LLL [ 0.9 cm] s/p COVID vaccine; [Dr.Aleskerov]; July 2022- left pleural pain-  ? ?#  ?#October 2022 MRI brain-punctate lesion-asymptomatic [D/w- Dr.Vaslow]; December 2022 MRI negative for any acute process; chronic sclerosis of hippocampus-clinically insignificant. ? ?COPD-non-complaint  ?  ?------------------  ? ?DIAGNOSIS:  ?A. PLEURAL MASS, LEFT CHEST; BIOPSY:  ?- DIAGNOSTIC OF MALIGNANCY.  ?- NON-SMALL CELL CARCINOMA, FAVOR ADENOCARCINOMA.  ? ?Comment:  ?The tumor cells are positive for CK7 and TTF1 (patchy).  They are  ?negative for p40.  ? ?# 12/10/2020-carbo Alimta; cycle #1 [Keytruda pending insurance] ? ?#NGS PD-L1 TPS 1% ?  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 Cancer of lower lobe of left lung [facility]) ?  ?12/10/2020 Cancer Staging  ? Staging form: Lung, AJCC 8th Edition ?- Clinical: Stage IVA (cT4, cN3, cM1a) - Signed by Cammie Sickle, MD on 12/10/2020 ?  ?12/10/2020 -  Chemotherapy  ? Patient is on Treatment Plan : LUNG CARBOplatin / Pemetrexed / Pembrolizumab q21d Induction x 4 cycles / Maintenance Pemetrexed + Pembrolizumab  ?   ? ? ?HISTORY OF PRESENTING ILLNESS: Ambulating independently.  Alone. ? ?Felicia Manning 66 y.o.  female history of smoking-with stage IV non-small cell lung cancer, favor adenocarcinoma, currently on chemotherapy-immunotherapy [carbo Alimta Keytruda] is here for follow-up, consideration of alimta-keytruda.  ? ?She complains of nausea, worse with treatments. Requesting to receive decadron with her treatments. Not well controlled with zofran and compazine. Feels like nausea is constant for a week. No additional falls but does complain of weakness for week after treatment.  ? ?Review of Systems  ?Constitutional:  Positive for malaise/fatigue. Negative for chills, diaphoresis, fever and weight loss.  ?HENT:  Negative for nosebleeds and sore throat.   ?Eyes:  Negative for double vision.  ?Respiratory:  Positive for cough and sputum production. Negative for hemoptysis and shortness of breath.   ?Cardiovascular:  Negative for palpitations, orthopnea and leg swelling.  ?Gastrointestinal:  Negative for abdominal pain, blood in stool, constipation, diarrhea, heartburn, melena, nausea and vomiting.  ?Genitourinary:  Negative for dysuria, frequency and urgency.  ?Musculoskeletal:  Negative for back pain and joint pain.  ?Skin: Negative.  Negative for itching and rash.  ?Neurological:  Negative for dizziness, tingling, focal weakness and weakness.  ?  Endo/Heme/Allergies:  Does not bruise/bleed  easily.  ?Psychiatric/Behavioral:  Negative for depression. The patient is not nervous/anxious and does not have insomnia.    ? ?MEDICAL HISTORY:  ?Past Medical History:  ?Diagnosis Date  ? Barrett's esophagus   ? Dysrhythmia   ? stress related at work.  ? GERD (gastroesophageal reflux disease)   ? Hepatitis C 2008  ? Hyperlipidemia   ? Hypertension   ? Hypothyroidism   ? Nephrolithiasis   ? Non-small cell carcinoma of left lung ([place])   ? Osteoporosis   ? Pulmonary mass   ? Thyroid cancer ([place]) 2012  ? Thyroid cancer ([place])   ? ? ?SURGICAL HISTORY: ?Past Surgical History:  ?Procedure Laterality Date  ? ABDOMINAL HYSTERECTOMY  2006  ? COLONOSCOPY WITH PROPOFOL N/A 04/25/2015  ? Procedure: COLONOSCOPY WITH PROPOFOL;  Surgeon: Josefine Class, MD;  Location: [facility] ENDOSCOPY;  Service: Endoscopy;  Laterality: N/A;  ? CYSTOSCOPY W/ RETROGRADES Bilateral 05/19/2015  ? Procedure: CYSTOSCOPY WITH RETROGRADE PYELOGRAM;  Surgeon: Hollice Espy, MD;  Location: [facility] ORS;  Service: Urology;  Laterality: Bilateral;  ? CYSTOSCOPY W/ URETERAL STENT PLACEMENT Right 05/26/2015  ? Procedure: CYSTOSCOPY WITH STENT REPLACEMENT;  Surgeon: Hollice Espy, MD;  Location: [facility] ORS;  Service: Urology;  Laterality: Right;  ? CYSTOSCOPY WITH STENT PLACEMENT Right 05/19/2015  ? Procedure: CYSTOSCOPY WITH STENT PLACEMENT;  Surgeon: Hollice Espy, MD;  Location: [facility] ORS;  Service: Urology;  Laterality: Right;  ? CYSTOSCOPY WITH STENT PLACEMENT Right 05/26/2015  ? Procedure: CYSTOSCOPY WITH STENT PLACEMENT;  Surgeon: Hollice Espy, MD;  Location: [facility] ORS;  Service: Urology;  Laterality: Right;  ? CYSTOSCOPY WITH STENT PLACEMENT Right 06/25/2016  ? Procedure: CYSTOSCOPY WITH STENT PLACEMENT;  Surgeon: Nickie Retort, MD;  Location: [facility] ORS;  Service: Urology;  Laterality: Right;  ? CYSTOSCOPY WITH STENT PLACEMENT Left 10/31/2017  ? Procedure: CYSTOSCOPY WITH STENT PLACEMENT;  Surgeon: Hollice Espy, MD;  Location: [facility] ORS;  Service: Urology;   Laterality: Left;  ? CYSTOSCOPY/URETEROSCOPY/HOLMIUM LASER/STENT PLACEMENT Left 07/23/2015  ? Procedure: CYSTOSCOPY/URETEROSCOPY/HOLMIUM LASER/STENT PLACEMENT/RETROGRADE PYELOGRAM;  Surgeon: Hollice Espy, MD;  Location: [facility] ORS;  Service: Urology;  Laterality: Left;  ? CYSTOSCOPY/URETEROSCOPY/HOLMIUM LASER/STENT PLACEMENT Left 11/16/2017  ? Procedure: CYSTOSCOPY/URETEROSCOPY/HOLMIUM LASER/STENT Exchange;  Surgeon: Hollice Espy, MD;  Location: [facility] ORS;  Service: Urology;  Laterality: Left;  ? ESOPHAGOGASTRODUODENOSCOPY (EGD) WITH PROPOFOL N/A 04/25/2015  ? Procedure: ESOPHAGOGASTRODUODENOSCOPY (EGD) WITH PROPOFOL;  Surgeon: Josefine Class, MD;  Location: [facility] ENDOSCOPY;  Service: Endoscopy;  Laterality: N/A;  ? EYE SURGERY Bilateral 1964  ? lazy eye repair  ? EYE SURGERY Bilateral 2001  ? lazy eye repair  ? IR IMAGING GUIDED PORT INSERTION  12/08/2020  ? LITHOTRIPSY  2009  ? THYROIDECTOMY  2010  ? URETEROSCOPY WITH HOLMIUM LASER LITHOTRIPSY Right 05/19/2015  ? Procedure: URETEROSCOPY WITH HOLMIUM LASER LITHOTRIPSY;  Surgeon: Hollice Espy, MD;  Location: [facility] ORS;  Service: Urology;  Laterality: Right;  ? URETEROSCOPY WITH HOLMIUM LASER LITHOTRIPSY Right 05/26/2015  ? Procedure: URETEROSCOPY WITH HOLMIUM LASER LITHOTRIPSY;  Surgeon: Hollice Espy, MD;  Location: [facility] ORS;  Service: Urology;  Laterality: Right;  ? URETEROSCOPY WITH HOLMIUM LASER LITHOTRIPSY Right 06/25/2016  ? Procedure: URETEROSCOPY WITH HOLMIUM LASER LITHOTRIPSY;  Surgeon: Nickie Retort, MD;  Location: [facility] ORS;  Service: Urology;  Laterality: Right;  ? ? ?SOCIAL HISTORY: ?Social History  ? ?Socioeconomic History  ? Marital status: Divorced  ?  Spouse name: Not on file  ? Number of children: Not on file  ? Years  of education: Not on file  ? Highest education level: Not on file  ?Occupational History  ? Not on file  ?Tobacco Use  ? Smoking status: Every Day  ?  Packs/day: 1.00  ?  Years: 40.00  ?  Pack years: 40.00  ?  Types: Cigarettes   ? Smokeless tobacco: Never  ?Vaping Use  ? Vaping Use: Every day  ?Substance and Sexual Activity  ? Alcohol use: Not Currently  ?  Comment: socially  ? Drug use: No  ? Sexual activity: Not Currently  ?  Birth cont

## 2021-05-12 ENCOUNTER — Other Ambulatory Visit: Payer: Self-pay | Admitting: *Deleted

## 2021-05-12 MED ORDER — ALBUTEROL SULFATE HFA 108 (90 BASE) MCG/ACT IN AERS: 2.0000 | INHALATION_SPRAY | Freq: Four times a day (QID) | RESPIRATORY_TRACT | 2 refills | Status: DC | PRN

## 2021-05-14 ENCOUNTER — Other Ambulatory Visit: Payer: Self-pay | Admitting: *Deleted

## 2021-05-14 MED ORDER — HYDROCODONE-ACETAMINOPHEN 5-325 MG PO TABS: 1.0000 | ORAL_TABLET | Freq: Three times a day (TID) | ORAL | 0 refills | Status: DC | PRN

## 2021-05-14 NOTE — Telephone Encounter (Signed)
Pt requesting refill of hydrocodone be sent into [facility] on [address]  ?

## 2021-06-03 ENCOUNTER — Inpatient Hospital Stay: Payer: 59 | Attending: Internal Medicine

## 2021-06-03 ENCOUNTER — Inpatient Hospital Stay: Payer: 59

## 2021-06-03 ENCOUNTER — Encounter: Payer: Self-pay | Admitting: Internal Medicine

## 2021-06-03 ENCOUNTER — Inpatient Hospital Stay (HOSPITAL_BASED_OUTPATIENT_CLINIC_OR_DEPARTMENT_OTHER): Payer: 59 | Admitting: Internal Medicine

## 2021-06-03 DIAGNOSIS — Z87891 Personal history of nicotine dependence: Secondary | ICD-10-CM | POA: Diagnosis not present

## 2021-06-03 DIAGNOSIS — J449 Chronic obstructive pulmonary disease, unspecified: Secondary | ICD-10-CM | POA: Diagnosis not present

## 2021-06-03 DIAGNOSIS — C3432 Malignant neoplasm of lower lobe, left bronchus or lung: Secondary | ICD-10-CM | POA: Diagnosis present

## 2021-06-03 DIAGNOSIS — Z79899 Other long term (current) drug therapy: Secondary | ICD-10-CM | POA: Diagnosis not present

## 2021-06-03 DIAGNOSIS — Z5111 Encounter for antineoplastic chemotherapy: Secondary | ICD-10-CM | POA: Insufficient documentation

## 2021-06-03 DIAGNOSIS — R197 Diarrhea, unspecified: Secondary | ICD-10-CM | POA: Insufficient documentation

## 2021-06-03 DIAGNOSIS — R590 Localized enlarged lymph nodes: Secondary | ICD-10-CM | POA: Diagnosis not present

## 2021-06-03 DIAGNOSIS — R110 Nausea: Secondary | ICD-10-CM | POA: Diagnosis not present

## 2021-06-03 DIAGNOSIS — E1136 Type 2 diabetes mellitus with diabetic cataract: Secondary | ICD-10-CM | POA: Insufficient documentation

## 2021-06-03 LAB — COMPREHENSIVE METABOLIC PANEL
ALT: 18 U/L (ref 0–44)
AST: 18 U/L (ref 15–41)
Albumin: 3.5 g/dL (ref 3.5–5.0)
Alkaline Phosphatase: 57 U/L (ref 38–126)
Anion gap: 6 (ref 5–15)
BUN: 14 mg/dL (ref 8–23)
CO2: 25 mmol/L (ref 22–32)
Calcium: 8.7 mg/dL — ABNORMAL LOW (ref 8.9–10.3)
Chloride: 105 mmol/L (ref 98–111)
Creatinine, Ser: 0.87 mg/dL (ref 0.44–1.00)
GFR, Estimated: >60 mL/min (ref >60)
Glucose, Bld: 123 mg/dL — ABNORMAL HIGH (ref 70–99)
Potassium: 3.6 mmol/L (ref 3.5–5.1)
Sodium: 136 mmol/L (ref 135–145)
Total Bilirubin: 0.7 mg/dL (ref 0.3–1.2)
Total Protein: 7.1 g/dL (ref 6.5–8.1)

## 2021-06-03 LAB — CBC WITH DIFFERENTIAL/PLATELET
Abs Immature Granulocytes: 0.03 10*3/uL (ref 0.00–0.07)
Basophils Absolute: 0.1 10*3/uL (ref 0.0–0.1)
Basophils Relative: 1 %
Eosinophils Absolute: 0.2 10*3/uL (ref 0.0–0.5)
Eosinophils Relative: 2 %
HCT: 36.5 % (ref 36.0–46.0)
Hemoglobin: 11.6 g/dL — ABNORMAL LOW (ref 12.0–15.0)
Immature Granulocytes: 0 %
Lymphocytes Relative: 18 %
Lymphs Abs: 2.2 10*3/uL (ref 0.7–4.0)
MCH: 33.1 pg (ref 26.0–34.0)
MCHC: 31.8 g/dL (ref 30.0–36.0)
MCV: 104.3 fL — ABNORMAL HIGH (ref 80.0–100.0)
Monocytes Absolute: 1 10*3/uL (ref 0.1–1.0)
Monocytes Relative: 8 %
Neutro Abs: 8.4 10*3/uL — ABNORMAL HIGH (ref 1.7–7.7)
Neutrophils Relative %: 71 %
Platelets: 208 10*3/uL (ref 150–400)
RBC: 3.5 MIL/uL — ABNORMAL LOW (ref 3.87–5.11)
RDW: 14.1 % (ref 11.5–15.5)
WBC: 11.9 10*3/uL — ABNORMAL HIGH (ref 4.0–10.5)
nRBC: 0 % (ref 0.0–0.2)

## 2021-06-03 LAB — TSH: TSH: 6.341 u[IU]/mL — ABNORMAL HIGH (ref 0.350–4.500)

## 2021-06-03 LAB — VITAMIN B12: Vitamin B-12: 436 pg/mL (ref 180–914)

## 2021-06-03 MED ORDER — SODIUM CHLORIDE 0.9 % IV SOLN
10.0000 mg | Freq: Once | INTRAVENOUS | Status: AC
  Administered 2021-06-03: 10 mg via INTRAVENOUS
  Filled 2021-06-03: qty 10

## 2021-06-03 MED ORDER — SODIUM CHLORIDE 0.9 % IV SOLN
200.0000 mg | Freq: Once | INTRAVENOUS | Status: AC
  Administered 2021-06-03: 200 mg via INTRAVENOUS
  Filled 2021-06-03: qty 8

## 2021-06-03 MED ORDER — SODIUM CHLORIDE 0.9 % IV SOLN
500.0000 mg/m2 | Freq: Once | INTRAVENOUS | Status: AC
  Administered 2021-06-03: 1000 mg via INTRAVENOUS
  Filled 2021-06-03: qty 40

## 2021-06-03 MED ORDER — SODIUM CHLORIDE 0.9 % IV SOLN: 8.0000 mg | Freq: Once | INTRAVENOUS | Status: DC

## 2021-06-03 MED ORDER — ONDANSETRON HCL 4 MG/2ML IJ SOLN
8.0000 mg | Freq: Once | INTRAMUSCULAR | Status: AC
  Administered 2021-06-03: 8 mg via INTRAVENOUS
  Filled 2021-06-03: qty 4

## 2021-06-03 MED ORDER — HEPARIN SOD (PORK) LOCK FLUSH 100 UNIT/ML IV SOLN
500.0000 [IU] | Freq: Once | INTRAVENOUS | Status: AC | PRN
  Administered 2021-06-03: 500 [IU]
  Filled 2021-06-03: qty 5

## 2021-06-03 MED ORDER — SODIUM CHLORIDE 0.9 % IV SOLN
Freq: Once | INTRAVENOUS | Status: AC
  Filled 2021-06-03: qty 250

## 2021-06-03 MED ORDER — PROCHLORPERAZINE MALEATE 10 MG PO TABS
10.0000 mg | ORAL_TABLET | Freq: Once | ORAL | Status: AC
  Administered 2021-06-03: 10 mg via ORAL
  Filled 2021-06-03: qty 1

## 2021-06-03 NOTE — Progress Notes (Signed)
[place] ?CONSULT NOTE ? ?Patient Care Team: ?Dion Body, MD as PCP - General (Family Medicine) ?Dallie Dad K (Inactive) ?Robert Bellow, MD (General Surgery) ?Telford Nab, RN as Sales executive ? ?CHIEF COMPLAINTS/PURPOSE OF CONSULTATION: lung cancer ? ?Oncology History Overview Note  ?IMPRESSION: ?Hypermetabolic solid pulmonary nodule of the left lower lobe, favor ?primary lung malignancy. ?  ?Numerous hypermetabolic nodules of the lower left pleura, compatible ?with pleural metastatic disease. ?  ?Hypermetabolic subcarinal and left hilar lymph nodes, compatible ?with metastatic disease. ?  ?Additional bilateral subsolid pulmonary nodules are seen which are ?unchanged in size compared to prior chest CT dated June 06, 2019 ?and too small to characterize for FDG avidity, concerning for ?multifocal indolent lung adenocarcinoma. Recommend attention on ?follow-up. ?  ?Mild asymmetric FDG uptake below mediastinal blood pool within a ?small nodule of the left parotid gland, favored to be benign. ?Recommend attention on follow-up. ?  ?No evidence of metastatic disease in the abdomen or pelvis. ?  ?No evidence of osseous metastatic disease. ?  ?--------------------- ? ?# MARCH 2022- incidental LLL [ 0.9 cm] s/p COVID vaccine; [Dr.Aleskerov]; July 2022- left pleural pain-  ? ?#  ?#October 2022 MRI brain-punctate lesion-asymptomatic [D/w- Dr.Vaslow]; December 2022 MRI negative for any acute process; chronic sclerosis of hippocampus-clinically insignificant. ? ?COPD-non-complaint  ?  ?------------------  ? ?DIAGNOSIS:  ?A. PLEURAL MASS, LEFT CHEST; BIOPSY:  ?- DIAGNOSTIC OF MALIGNANCY.  ?- NON-SMALL CELL CARCINOMA, FAVOR ADENOCARCINOMA.  ? ?Comment:  ?The tumor cells are positive for CK7 and TTF1 (patchy).  They are  ?negative for p40.  ? ?# 12/10/2020-carbo Alimta; cycle #1 [Keytruda pending insurance] ? ?#NGS PD-L1 TPS 1% ?  ?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[place])   ?05/02/2015 Initial Diagnosis  ? 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[place]) ?  ?Cancer of lower lobe of left lung ([place])  ?12/02/2020 Initial Diagnosis  ? Cancer of lower lobe of left lung [facility]) ?  ?12/10/2020 Cancer Staging  ? Staging form: Lung, AJCC 8th Edition ?- Clinical: Stage IVA (cT4, cN3, cM1a) - Signed by Cammie Sickle, MD on 12/10/2020 ? ?  ?12/10/2020 -  Chemotherapy  ? Patient is on Treatment Plan : LUNG CARBOplatin / Pemetrexed / Pembrolizumab q21d Induction x 4 cycles / Maintenance Pemetrexed + Pembrolizumab  ? ?  ?  ? ? ? ?HISTORY OF PRESENTING ILLNESS: Ambulating independently.  Alone. ? ?Felicia Manning 66 y.o.  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 ? ?In the interim patient was evaluated at [facility] for a nonmelanotic lesion noted in the eye.  Complains of blurry vision however attributed to cataract. ? ?Mild diarrhea mild nausea currently improved with antiemetics. ? ?Patient denies any worsening pain.  Appetite is good.  No weight loss. ? ?Review of Systems  ?Constitutional:  Positive for malaise/fatigue. Negative for chills, diaphoresis, fever and weight loss.  ?HENT:  Negative for nosebleeds and sore throat.   ?Eyes:  Negative for double vision.  ?Respiratory:  Positive for cough and sputum production. Negative for hemoptysis and shortness of breath.   ?Cardiovascular:  Negative for palpitations, orthopnea and leg swelling.  ?Gastrointestinal:  Negative for abdominal pain, blood in stool, constipation, diarrhea, heartburn, melena, nausea and vomiting.  ?Genitourinary:  Negative for dysuria, frequency and urgency.  ?Musculoskeletal:  Negative for back pain and joint pain.  ?Skin: Negative.  Negative for itching and rash.  ?Neurological:  Negative for dizziness,  tingling, focal weakness and weakness.  ?Endo/Heme/Allergies:  Does not bruise/bleed easily.   ?Psychiatric/Behavioral:  Negative for depression. The patient is not nervous/anxious and does not have insomnia.    ? ?MEDICAL HISTORY:  ?Past Medical History:  ?Diagnosis Date  ? Barrett's esophagus   ? Dysrhythmia   ? stress related at work.  ? GERD (gastroesophageal reflux disease)   ? Hepatitis C 2008  ? Hyperlipidemia   ? Hypertension   ? Hypothyroidism   ? Nephrolithiasis   ? Non-small cell carcinoma of left lung ([place])   ? Osteoporosis   ? Pulmonary mass   ? Thyroid cancer ([place]) 2012  ? Thyroid cancer ([place])   ? Tumor   ? tumor per pt back of rt eye  ? ? ?SURGICAL HISTORY: ?Past Surgical History:  ?Procedure Laterality Date  ? ABDOMINAL HYSTERECTOMY  2006  ? COLONOSCOPY WITH PROPOFOL N/A 04/25/2015  ? Procedure: COLONOSCOPY WITH PROPOFOL;  Surgeon: Josefine Class, MD;  Location: [facility] ENDOSCOPY;  Service: Endoscopy;  Laterality: N/A;  ? CYSTOSCOPY W/ RETROGRADES Bilateral 05/19/2015  ? Procedure: CYSTOSCOPY WITH RETROGRADE PYELOGRAM;  Surgeon: Hollice Espy, MD;  Location: [facility] ORS;  Service: Urology;  Laterality: Bilateral;  ? CYSTOSCOPY W/ URETERAL STENT PLACEMENT Right 05/26/2015  ? Procedure: CYSTOSCOPY WITH STENT REPLACEMENT;  Surgeon: Hollice Espy, MD;  Location: [facility] ORS;  Service: Urology;  Laterality: Right;  ? CYSTOSCOPY WITH STENT PLACEMENT Right 05/19/2015  ? Procedure: CYSTOSCOPY WITH STENT PLACEMENT;  Surgeon: Hollice Espy, MD;  Location: [facility] ORS;  Service: Urology;  Laterality: Right;  ? CYSTOSCOPY WITH STENT PLACEMENT Right 05/26/2015  ? Procedure: CYSTOSCOPY WITH STENT PLACEMENT;  Surgeon: Hollice Espy, MD;  Location: [facility] ORS;  Service: Urology;  Laterality: Right;  ? CYSTOSCOPY WITH STENT PLACEMENT Right 06/25/2016  ? Procedure: CYSTOSCOPY WITH STENT PLACEMENT;  Surgeon: Nickie Retort, MD;  Location: [facility] ORS;  Service: Urology;  Laterality: Right;  ? CYSTOSCOPY WITH STENT PLACEMENT Left 10/31/2017  ? Procedure: CYSTOSCOPY WITH STENT PLACEMENT;  Surgeon: Hollice Espy, MD;   Location: [facility] ORS;  Service: Urology;  Laterality: Left;  ? CYSTOSCOPY/URETEROSCOPY/HOLMIUM LASER/STENT PLACEMENT Left 07/23/2015  ? Procedure: CYSTOSCOPY/URETEROSCOPY/HOLMIUM LASER/STENT PLACEMENT/RETROGRADE PYELOGRAM;  Surgeon: Hollice Espy, MD;  Location: [facility] ORS;  Service: Urology;  Laterality: Left;  ? CYSTOSCOPY/URETEROSCOPY/HOLMIUM LASER/STENT PLACEMENT Left 11/16/2017  ? Procedure: CYSTOSCOPY/URETEROSCOPY/HOLMIUM LASER/STENT Exchange;  Surgeon: Hollice Espy, MD;  Location: [facility] ORS;  Service: Urology;  Laterality: Left;  ? ESOPHAGOGASTRODUODENOSCOPY (EGD) WITH PROPOFOL N/A 04/25/2015  ? Procedure: ESOPHAGOGASTRODUODENOSCOPY (EGD) WITH PROPOFOL;  Surgeon: Josefine Class, MD;  Location[facility] ENDOSCOPY;  Service: Endoscopy;  Laterality: N/A;  ? EYE SURGERY Bilateral 1964  ? lazy eye repair  ? EYE SURGERY Bilateral 2001  ? lazy eye repair  ? IR IMAGING GUIDED PORT INSERTION  12/08/2020  ? LITHOTRIPSY  2009  ? THYROIDECTOMY  2010  ? URETEROSCOPY WITH HOLMIUM LASER LITHOTRIPSY Right 05/19/2015  ? Procedure: URETEROSCOPY WITH HOLMIUM LASER LITHOTRIPSY;  Surgeon: Hollice Espy, MD;  Location: [facility] ORS;  Service: Urology;  Laterality: Right;  ? URETEROSCOPY WITH HOLMIUM LASER LITHOTRIPSY Right 05/26/2015  ? Procedure: URETEROSCOPY WITH HOLMIUM LASER LITHOTRIPSY;  Surgeon: Hollice Espy, MD;  Location: [facility] ORS;  Service: Urology;  Laterality: Right;  ? URETEROSCOPY WITH HOLMIUM LASER LITHOTRIPSY Right 06/25/2016  ? Procedure: URETEROSCOPY WITH HOLMIUM LASER LITHOTRIPSY;  Surgeon: Nickie Retort, MD;  Location: [facility] ORS;  Service: Urology;  Laterality: Right;  ? ? ?SOCIAL HISTORY: ?Social History  ? ?Socioeconomic History  ? Marital status: Divorced  ?  Spouse name: Not on file  ? Number of children: Not on file  ? Years of education: Not on file  ? Highest education level: Not on file  ?Occupational History  ? Not on file  ?Tobacco Use  ? Smoking status: Every Day  ?  Packs/day: 1.00  ?  Years: 40.00  ?   Pack years: 40.00  ?  Types: Cigarettes  ? Smokeless tobacco: Never  ?Vaping Use  ? Vaping Use: Every day  ?Substance and Sexual Activity  ? Alcohol use: Not Currently  ?  Comment: socially  ? Drug use: No  ? Sexual act

## 2021-06-03 NOTE — Patient Instructions (Signed)
[facility] CANCER CTR AT [place]  Discharge Instructions: ?Thank you for choosing [facility] to provide your oncology and hematology care.  ?If you have a lab appointment with the [facility], please go directly to the [facility] and check in at the registration area. ? ?Wear comfortable clothing and clothing appropriate for easy access to any Portacath or PICC line.  ? ?We strive to give you quality time with your provider. You may need to reschedule your appointment if you arrive late (15 or more minutes).  Arriving late affects you and other patients whose appointments are after yours.  Also, if you miss three or more appointments without notifying the office, you may be dismissed from the clinic at the provider?s discretion.    ?  ?For prescription refill requests, have your pharmacy contact our office and allow 72 hours for refills to be completed.   ? ? ?Today you received the following chemotherapy and/or immunotherapy agents: PEMEtrexed & pembrolizumab  ?  ?To help prevent nausea and vomiting after your treatment, we encourage you to take your nausea medication as directed. ? ?BELOW ARE SYMPTOMS THAT SHOULD BE REPORTED IMMEDIATELY: ?*FEVER GREATER THAN 100.4 F (38 ?C) OR HIGHER ?*CHILLS OR SWEATING ?*NAUSEA AND VOMITING THAT IS NOT CONTROLLED WITH YOUR NAUSEA MEDICATION ?*UNUSUAL SHORTNESS OF BREATH ?*UNUSUAL BRUISING OR BLEEDING ?*URINARY PROBLEMS (pain or burning when urinating, or frequent urination) ?*BOWEL PROBLEMS (unusual diarrhea, constipation, pain near the anus) ?TENDERNESS IN MOUTH AND THROAT WITH OR WITHOUT PRESENCE OF ULCERS (sore throat, sores in mouth, or a toothache) ?UNUSUAL RASH, SWELLING OR PAIN  ?UNUSUAL VAGINAL DISCHARGE OR ITCHING  ? ?Items with * indicate a potential emergency and should be followed up as soon as possible or go to the Emergency Department if any problems should occur. ? ?Please show the CHEMOTHERAPY ALERT CARD or IMMUNOTHERAPY ALERT  CARD at check-in to the Emergency Department and triage nurse. ? ?Should you have questions after your visit or need to cancel or reschedule your appointment, please contact [facility] CANCER [place] AT [place]  [phone] and follow the prompts.  Office hours are 8:00 a.m. to 4:30 p.m. Monday - Friday. Please note that voicemails left after 4:00 p.m. may not be returned until the following business day.  We are closed weekends and major holidays. You have access to a nurse at all times for urgent questions. Please call the main number to the clinic [phone] and follow the prompts. ? ?For any non-urgent questions, you may also contact your provider using MyChart. We now offer e-Visits for anyone 90 and older to request care online for non-urgent symptoms. For details visit [email or web address]. ?  ?Also download the MyChart app! Go to the app store, search "MyChart", open the app, select [facility], and log in with your MyChart username and password. ? ?Due to Covid, a mask is required upon entering the hospital/clinic. If you do not have a mask, one will be given to you upon arrival. For doctor visits, patients may have 1 support person aged 82 or older with them. For treatment visits, patients cannot have anyone with them due to current Covid guidelines and our immunocompromised population.  ?

## 2021-06-03 NOTE — Assessment & Plan Note (Addendum)
#  Stage IV -non-small cell favor adenocarcinoma; on carbo-alita-keytruda #2-FEB 3rd, 2023-continued improved left lower lobe lung nodule/pleural-based nodules/hilar and subcarinal lymphadenopathy.  ? ?#Proceed with  maintaintnance  Alimta Keytruda today Labs today reviewed;mild anemia- Hb 11;   acceptable for treatment today.  Will order imaging at next visit. ? ?# Left chest wall pain:-improved/resolved-using hydrocodone qhs- STABLE.  ? ?# Right eye vision changes-3.6 mm lesion noted in the right eye;s/p ophthalmology evaluation at [facility]. 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 ? ?# COPD [Dr.A]: continue adviar/ abulterol prn. STABLE.   ? ?# Diabetes-A1c 6.9 [AUG 2022]-Fatsing-95.  Continue glipizide 5 mg BID. STABLE.  ? ?# Diarrhea-G-1-on immoiudm/ Oral mucositis- G-1-STABLE.   ? ?# Nausea- G-1 -Zofran Dex Compazine Zofran- STABLE; if worse would consider Aloxi. ? ?# Mediport placement-s/p dye study c tPA-functioning. ? ?b12-3/22 ? ?#DISPOSITION: ?# Chemo today ?# 3 week- MD- labs- cbc/cmp; - Alimta-keytruda; Dr.B ? ? ? ? ? ? ?? ?

## 2021-06-03 NOTE — Progress Notes (Signed)
Patient tolerated PEMEtrexed/ pembrolizumab infusion well, no concerns/questions voiced. Patient stable at discharge. Refused AVS.   ?

## 2021-06-03 NOTE — Progress Notes (Signed)
Pt states nausea and diarrhea started  few days before chemo last time. Wanted to make you aware in case of dehydration. ?

## 2021-06-10 ENCOUNTER — Other Ambulatory Visit: Payer: Self-pay | Admitting: Internal Medicine

## 2021-06-10 DIAGNOSIS — C3432 Malignant neoplasm of lower lobe, left bronchus or lung: Secondary | ICD-10-CM

## 2021-06-22 ENCOUNTER — Other Ambulatory Visit: Payer: Self-pay | Admitting: *Deleted

## 2021-06-22 MED ORDER — HYDROCODONE-ACETAMINOPHEN 5-325 MG PO TABS: 1.0000 | ORAL_TABLET | Freq: Three times a day (TID) | ORAL | 0 refills | Status: DC | PRN

## 2021-06-22 NOTE — Telephone Encounter (Signed)
Pt requesting refill of hydrocodone be sent into [facility] on [address].  ?

## 2021-06-24 ENCOUNTER — Inpatient Hospital Stay: Payer: 59

## 2021-06-24 ENCOUNTER — Inpatient Hospital Stay: Payer: 59 | Attending: Internal Medicine | Admitting: Internal Medicine

## 2021-06-24 ENCOUNTER — Encounter: Payer: Self-pay | Admitting: Internal Medicine

## 2021-06-24 DIAGNOSIS — C3432 Malignant neoplasm of lower lobe, left bronchus or lung: Secondary | ICD-10-CM | POA: Insufficient documentation

## 2021-06-24 DIAGNOSIS — J449 Chronic obstructive pulmonary disease, unspecified: Secondary | ICD-10-CM | POA: Insufficient documentation

## 2021-06-24 DIAGNOSIS — E119 Type 2 diabetes mellitus without complications: Secondary | ICD-10-CM | POA: Diagnosis not present

## 2021-06-24 DIAGNOSIS — Z87891 Personal history of nicotine dependence: Secondary | ICD-10-CM | POA: Insufficient documentation

## 2021-06-24 DIAGNOSIS — Z79899 Other long term (current) drug therapy: Secondary | ICD-10-CM | POA: Diagnosis not present

## 2021-06-24 DIAGNOSIS — Z5111 Encounter for antineoplastic chemotherapy: Secondary | ICD-10-CM | POA: Insufficient documentation

## 2021-06-24 LAB — CBC WITH DIFFERENTIAL/PLATELET
Abs Immature Granulocytes: 0.03 10*3/uL (ref 0.00–0.07)
Basophils Absolute: 0.1 10*3/uL (ref 0.0–0.1)
Basophils Relative: 1 %
Eosinophils Absolute: 0.2 10*3/uL (ref 0.0–0.5)
Eosinophils Relative: 2 %
HCT: 36.4 % (ref 36.0–46.0)
Hemoglobin: 11.4 g/dL — ABNORMAL LOW (ref 12.0–15.0)
Immature Granulocytes: 0 %
Lymphocytes Relative: 27 %
Lymphs Abs: 2.2 10*3/uL (ref 0.7–4.0)
MCH: 32.3 pg (ref 26.0–34.0)
MCHC: 31.3 g/dL (ref 30.0–36.0)
MCV: 103.1 fL — ABNORMAL HIGH (ref 80.0–100.0)
Monocytes Absolute: 0.4 10*3/uL (ref 0.1–1.0)
Monocytes Relative: 5 %
Neutro Abs: 5.3 10*3/uL (ref 1.7–7.7)
Neutrophils Relative %: 65 %
Platelets: 284 10*3/uL (ref 150–400)
RBC: 3.53 MIL/uL — ABNORMAL LOW (ref 3.87–5.11)
RDW: 15.4 % (ref 11.5–15.5)
WBC: 8.2 10*3/uL (ref 4.0–10.5)
nRBC: 0 % (ref 0.0–0.2)

## 2021-06-24 LAB — COMPREHENSIVE METABOLIC PANEL
ALT: 19 U/L (ref 0–44)
AST: 22 U/L (ref 15–41)
Albumin: 3.5 g/dL (ref 3.5–5.0)
Alkaline Phosphatase: 69 U/L (ref 38–126)
Anion gap: 5 (ref 5–15)
BUN: 9 mg/dL (ref 8–23)
CO2: 26 mmol/L (ref 22–32)
Calcium: 8.4 mg/dL — ABNORMAL LOW (ref 8.9–10.3)
Chloride: 107 mmol/L (ref 98–111)
Creatinine, Ser: 0.78 mg/dL (ref 0.44–1.00)
GFR, Estimated: >60 mL/min (ref >60)
Glucose, Bld: 108 mg/dL — ABNORMAL HIGH (ref 70–99)
Potassium: 3.3 mmol/L — ABNORMAL LOW (ref 3.5–5.1)
Sodium: 138 mmol/L (ref 135–145)
Total Bilirubin: 0.7 mg/dL (ref 0.3–1.2)
Total Protein: 6.8 g/dL (ref 6.5–8.1)

## 2021-06-24 MED ORDER — PROCHLORPERAZINE MALEATE 10 MG PO TABS
10.0000 mg | ORAL_TABLET | Freq: Once | ORAL | Status: AC
  Administered 2021-06-24: 10 mg via ORAL

## 2021-06-24 MED ORDER — SODIUM CHLORIDE 0.9 % IV SOLN
500.0000 mg/m2 | Freq: Once | INTRAVENOUS | Status: AC
  Administered 2021-06-24: 1000 mg via INTRAVENOUS
  Filled 2021-06-24: qty 40

## 2021-06-24 MED ORDER — SODIUM CHLORIDE 0.9 % IV SOLN
Freq: Once | INTRAVENOUS | Status: AC
  Filled 2021-06-24: qty 250

## 2021-06-24 MED ORDER — ONDANSETRON HCL 4 MG/2ML IJ SOLN
8.0000 mg | Freq: Once | INTRAMUSCULAR | Status: AC
  Administered 2021-06-24: 8 mg via INTRAVENOUS

## 2021-06-24 MED ORDER — SODIUM CHLORIDE 0.9 % IV SOLN
200.0000 mg | Freq: Once | INTRAVENOUS | Status: AC
  Administered 2021-06-24: 200 mg via INTRAVENOUS
  Filled 2021-06-24: qty 200

## 2021-06-24 MED ORDER — CYANOCOBALAMIN 1000 MCG/ML IJ SOLN
1000.0000 ug | Freq: Once | INTRAMUSCULAR | Status: AC
  Administered 2021-06-24: 1000 ug via INTRAMUSCULAR

## 2021-06-24 MED ORDER — HEPARIN SOD (PORK) LOCK FLUSH 100 UNIT/ML IV SOLN
500.0000 [IU] | Freq: Once | INTRAVENOUS | Status: AC | PRN
  Administered 2021-06-24: 500 [IU]
  Filled 2021-06-24: qty 5

## 2021-06-24 MED ORDER — MONTELUKAST SODIUM 10 MG PO TABS: 10.0000 mg | ORAL_TABLET | Freq: Every day | ORAL | 1 refills | Status: DC

## 2021-06-24 MED ORDER — SODIUM CHLORIDE 0.9 % IV SOLN: 8.0000 mg | Freq: Once | INTRAVENOUS | Status: DC

## 2021-06-24 MED ORDER — SODIUM CHLORIDE 0.9 % IV SOLN
10.0000 mg | Freq: Once | INTRAVENOUS | Status: AC
  Administered 2021-06-24: 10 mg via INTRAVENOUS
  Filled 2021-06-24: qty 10

## 2021-06-24 NOTE — Progress Notes (Signed)
Patient has productive cough that is worse during night.   Realized the Hydrocodone prescribed for rib pain helped relieve the cough.  Tried OTC cough syrup that officered relief for only 2 hours. Rib pain has improved since starting treatment and wold like to discuss prescription cough syrup. ? ?Does have occasional nausea that is relieved with medication.  Also occasional episodes of diarrhea that is relieved with OTC Imodium.   ? ?Worsening exertional SOBr. ?

## 2021-06-24 NOTE — Patient Instructions (Signed)
[facility] CANCER CTR AT [place]  Discharge Instructions: ?Thank you for choosing [facility] to provide your oncology and hematology care.  ?If you have a lab appointment with the [facility], please go directly to the [facility] and check in at the registration area. ? ?Wear comfortable clothing and clothing appropriate for easy access to any Portacath or PICC line.  ? ?We strive to give you quality time with your provider. You may need to reschedule your appointment if you arrive late (15 or more minutes).  Arriving late affects you and other patients whose appointments are after yours.  Also, if you miss three or more appointments without notifying the office, you may be dismissed from the clinic at the provider?s discretion.    ?  ?For prescription refill requests, have your pharmacy contact our office and allow 72 hours for refills to be completed.   ? ?Today you received the following chemotherapy and/or immunotherapy agents: Keytruda, Alimta    ?  ?To help prevent nausea and vomiting after your treatment, we encourage you to take your nausea medication as directed. ? ?BELOW ARE SYMPTOMS THAT SHOULD BE REPORTED IMMEDIATELY: ?*FEVER GREATER THAN 100.4 F (38 ?C) OR HIGHER ?*CHILLS OR SWEATING ?*NAUSEA AND VOMITING THAT IS NOT CONTROLLED WITH YOUR NAUSEA MEDICATION ?*UNUSUAL SHORTNESS OF BREATH ?*UNUSUAL BRUISING OR BLEEDING ?*URINARY PROBLEMS (pain or burning when urinating, or frequent urination) ?*BOWEL PROBLEMS (unusual diarrhea, constipation, pain near the anus) ?TENDERNESS IN MOUTH AND THROAT WITH OR WITHOUT PRESENCE OF ULCERS (sore throat, sores in mouth, or a toothache) ?UNUSUAL RASH, SWELLING OR PAIN  ?UNUSUAL VAGINAL DISCHARGE OR ITCHING  ? ?Items with * indicate a potential emergency and should be followed up as soon as possible or go to the Emergency Department if any problems should occur. ? ?Please show the CHEMOTHERAPY ALERT CARD or IMMUNOTHERAPY ALERT CARD at  check-in to the Emergency Department and triage nurse. ? ?Should you have questions after your visit or need to cancel or reschedule your appointment, please contact [facility] CANCER [place] AT [place]  [phone] and follow the prompts.  Office hours are 8:00 a.m. to 4:30 p.m. Monday - Friday. Please note that voicemails left after 4:00 p.m. may not be returned until the following business day.  We are closed weekends and major holidays. You have access to a nurse at all times for urgent questions. Please call the main number to the clinic [phone] and follow the prompts. ? ?For any non-urgent questions, you may also contact your provider using MyChart. We now offer e-Visits for anyone 65 and older to request care online for non-urgent symptoms. For details visit [email or web address]. ?  ?Also download the MyChart app! Go to the app store, search "MyChart", open the app, select [place], and log in with your MyChart username and password. ? ?Due to Covid, a mask is required upon entering the hospital/clinic. If you do not have a mask, one will be given to you upon arrival. For doctor visits, patients may have 1 support person aged 59 or older with them. For treatment visits, patients cannot have anyone with them due to current Covid guidelines and our immunocompromised population.  ?

## 2021-06-24 NOTE — Assessment & Plan Note (Addendum)
#  Stage IV -non-small cell favor adenocarcinoma; on carbo-alita-keytruda #2-FEB 3rd, 2023-continued improved left lower lobe lung nodule/pleural-based nodules/hilar and subcarinal lymphadenopathy.  ? ?#Proceed with  maintaintnance  Alimta Keytruda today Labs today reviewed;mild anemia- Hb 11;   acceptable for treatment today.  Will order imaging  Today.  See discussion below regarding cough- ? ?# COPD [Dr.A]/allergies likely secondary to poorly controlled COPD.  Recommend using albuterol 3-4 times a day and also compliance with Advair.  Recommend Singulair.  Ambulatory heart rate/pulse ox-80s; greater than 95.  No concern for immunotherapy induced pneumonitis. ? ?# Left chest wall pain:-improved/resolved-using hydrocodone qhs- STABLE.  ? ?# Right eye vision changes-3.6 mm lesion noted in the right eye;s/p ophthalmology evaluation at [facility]. 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 STABLE.  ? ?# Diabetes-A1c 6.9 [AUG 2022]-Fatsing-95.  Continue glipizide 5 mg BID. STABLE.  ? ?# Diarrhea-G-1-on immoiudm/ Oral mucositis- G-1-STABLE.   ? ?# Nausea- G-1 -Zofran Dex Compazine Zofran- [if worse would consider Aloxi] STABLE;  ? ?# Mediport placement-s/p dye study c tPA-functioning.STABLE ? ?b12-3/22 ? ?#DISPOSITION: ?# Chemo today ?# 3 week- MD- labs- cbc/cmp; - Alimta-keytruda;B12 injection CT CAP- Dr.B ? ? ? ? ? ? ?? ?

## 2021-06-24 NOTE — Progress Notes (Signed)
[place] ?CONSULT NOTE ? ?Patient Care Team: ?Dion Body, MD as PCP - General (Family Medicine) ?Dallie Dad K (Inactive) ?Robert Bellow, MD (General Surgery) ?Telford Nab, RN as Sales executive ? ?CHIEF COMPLAINTS/PURPOSE OF CONSULTATION: lung cancer ? ?Oncology History Overview Note  ?IMPRESSION: ?Hypermetabolic solid pulmonary nodule of the left lower lobe, favor ?primary lung malignancy. ?  ?Numerous hypermetabolic nodules of the lower left pleura, compatible ?with pleural metastatic disease. ?  ?Hypermetabolic subcarinal and left hilar lymph nodes, compatible ?with metastatic disease. ?  ?Additional bilateral subsolid pulmonary nodules are seen which are ?unchanged in size compared to prior chest CT dated June 06, 2019 ?and too small to characterize for FDG avidity, concerning for ?multifocal indolent lung adenocarcinoma. Recommend attention on ?follow-up. ?  ?Mild asymmetric FDG uptake below mediastinal blood pool within a ?small nodule of the left parotid gland, favored to be benign. ?Recommend attention on follow-up. ?  ?No evidence of metastatic disease in the abdomen or pelvis. ?  ?No evidence of osseous metastatic disease. ?  ?--------------------- ? ?# MARCH 2022- incidental LLL [ 0.9 cm] s/p COVID vaccine; [Dr.Aleskerov]; July 2022- left pleural pain-  ? ?#  ?#October 2022 MRI brain-punctate lesion-asymptomatic [D/w- Dr.Vaslow]; December 2022 MRI negative for any acute process; chronic sclerosis of hippocampus-clinically insignificant. ? ?COPD-non-complaint  ?  ?------------------  ? ?DIAGNOSIS:  ?A. PLEURAL MASS, LEFT CHEST; BIOPSY:  ?- DIAGNOSTIC OF MALIGNANCY.  ?- NON-SMALL CELL CARCINOMA, FAVOR ADENOCARCINOMA.  ? ?Comment:  ?The tumor cells are positive for CK7 and TTF1 (patchy).  They are  ?negative for p40.  ? ?# 12/10/2020-carbo Alimta; cycle #1 [Keytruda pending insurance] ? ?#NGS PD-L1 TPS 1% ?  ?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[place])   ?05/02/2015 Initial Diagnosis  ? 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[place]) ?  ?Cancer of lower lobe of left lung ([place])  ?12/02/2020 Initial Diagnosis  ? Cancer of lower lobe of left lung [facility]) ?  ?12/10/2020 Cancer Staging  ? Staging form: Lung, AJCC 8th Edition ?- Clinical: Stage IVA (cT4, cN3, cM1a) - Signed by Cammie Sickle, MD on 12/10/2020 ? ?  ?12/10/2020 -  Chemotherapy  ? Patient is on Treatment Plan : LUNG CARBOplatin / Pemetrexed / Pembrolizumab q21d Induction x 4 cycles / Maintenance Pemetrexed + Pembrolizumab  ? ?  ?  ? ? ? ?HISTORY OF PRESENTING ILLNESS: Ambulating independently.  Alone. ? ?Felicia Manning 66 y.o.  female history of smoking-with stage IV non-small cell lung cancer favor adenocarcinoma currently on chemotherapy-immunotherapy [Alimta Keytruda] is here for follow-up. ? ?Patient complains of moderate cough especially at nighttime with wheezing.  Patient has tried hydrocodone for cough suppression.  Complains of shortness of breath on exertion. ? ?Mild diarrhea mild nausea currently improved with antiemetics. ? ?Patient denies any worsening pain.  Appetite is good.  No weight loss. ? ?Review of Systems  ?Constitutional:  Positive for malaise/fatigue. Negative for chills, diaphoresis, fever and weight loss.  ?HENT:  Negative for nosebleeds and sore throat.   ?Eyes:  Negative for double vision.  ?Respiratory:  Positive for cough and sputum production. Negative for hemoptysis and shortness of breath.   ?Cardiovascular:  Negative for palpitations, orthopnea and leg swelling.  ?Gastrointestinal:  Negative for abdominal pain, blood in stool, constipation, diarrhea, heartburn, melena, nausea and vomiting.  ?Genitourinary:  Negative for dysuria, frequency and urgency.  ?Musculoskeletal:  Negative for back pain and joint pain.  ?Skin: Negative.  Negative for itching and rash.  ?Neurological:  Negative for dizziness,  tingling, focal weakness and weakness.  ?Endo/Heme/Allergies:  Does not  bruise/bleed easily.  ?Psychiatric/Behavioral:  Negative for depression. The patient is not nervous/anxious and does not have insomnia.    ? ?MEDICAL HISTORY:  ?Past Medical History:  ?Diagnosis Date  ? Barrett's esophagus   ? Dysrhythmia   ? stress related at work.  ? GERD (gastroesophageal reflux disease)   ? Hepatitis C 2008  ? Hyperlipidemia   ? Hypertension   ? Hypothyroidism   ? Nephrolithiasis   ? Non-small cell carcinoma of left lung ([place])   ? Osteoporosis   ? Pulmonary mass   ? Thyroid cancer ([place]) 2012  ? Thyroid cancer ([place])   ? Tumor   ? tumor per pt back of rt eye  ? ? ?SURGICAL HISTORY: ?Past Surgical History:  ?Procedure Laterality Date  ? ABDOMINAL HYSTERECTOMY  2006  ? COLONOSCOPY WITH PROPOFOL N/A 04/25/2015  ? Procedure: COLONOSCOPY WITH PROPOFOL;  Surgeon: Josefine Class, MD;  Location: [facility] ENDOSCOPY;  Service: Endoscopy;  Laterality: N/A;  ? CYSTOSCOPY W/ RETROGRADES Bilateral 05/19/2015  ? Procedure: CYSTOSCOPY WITH RETROGRADE PYELOGRAM;  Surgeon: Hollice Espy, MD;  Location: [facility] ORS;  Service: Urology;  Laterality: Bilateral;  ? CYSTOSCOPY W/ URETERAL STENT PLACEMENT Right 05/26/2015  ? Procedure: CYSTOSCOPY WITH STENT REPLACEMENT;  Surgeon: Hollice Espy, MD;  Location: [facility] ORS;  Service: Urology;  Laterality: Right;  ? CYSTOSCOPY WITH STENT PLACEMENT Right 05/19/2015  ? Procedure: CYSTOSCOPY WITH STENT PLACEMENT;  Surgeon: Hollice Espy, MD;  Location: [facility] ORS;  Service: Urology;  Laterality: Right;  ? CYSTOSCOPY WITH STENT PLACEMENT Right 05/26/2015  ? Procedure: CYSTOSCOPY WITH STENT PLACEMENT;  Surgeon: Hollice Espy, MD;  Location: [facility] ORS;  Service: Urology;  Laterality: Right;  ? CYSTOSCOPY WITH STENT PLACEMENT Right 06/25/2016  ? Procedure: CYSTOSCOPY WITH STENT PLACEMENT;  Surgeon: Nickie Retort, MD;  Location: [facility] ORS;  Service: Urology;  Laterality: Right;  ? CYSTOSCOPY WITH STENT PLACEMENT Left 10/31/2017  ? Procedure: CYSTOSCOPY WITH STENT PLACEMENT;  Surgeon:  Hollice Espy, MD;  Location: [facility] ORS;  Service: Urology;  Laterality: Left;  ? CYSTOSCOPY/URETEROSCOPY/HOLMIUM LASER/STENT PLACEMENT Left 07/23/2015  ? Procedure: CYSTOSCOPY/URETEROSCOPY/HOLMIUM LASER/STENT PLACEMENT/RETROGRADE PYELOGRAM;  Surgeon: Hollice Espy, MD;  Location: [facility] ORS;  Service: Urology;  Laterality: Left;  ? CYSTOSCOPY/URETEROSCOPY/HOLMIUM LASER/STENT PLACEMENT Left 11/16/2017  ? Procedure: CYSTOSCOPY/URETEROSCOPY/HOLMIUM LASER/STENT Exchange;  Surgeon: Hollice Espy, MD;  Location: [facility] ORS;  Service: Urology;  Laterality: Left;  ? ESOPHAGOGASTRODUODENOSCOPY (EGD) WITH PROPOFOL N/A 04/25/2015  ? Procedure: ESOPHAGOGASTRODUODENOSCOPY (EGD) WITH PROPOFOL;  Surgeon: Josefine Class, MD;  Location: [facility] ENDOSCOPY;  Service: Endoscopy;  Laterality: N/A;  ? EYE SURGERY Bilateral 1964  ? lazy eye repair  ? EYE SURGERY Bilateral 2001  ? lazy eye repair  ? IR IMAGING GUIDED PORT INSERTION  12/08/2020  ? LITHOTRIPSY  2009  ? THYROIDECTOMY  2010  ? URETEROSCOPY WITH HOLMIUM LASER LITHOTRIPSY Right 05/19/2015  ? Procedure: URETEROSCOPY WITH HOLMIUM LASER LITHOTRIPSY;  Surgeon: Hollice Espy, MD;  Location: [facility] ORS;  Service: Urology;  Laterality: Right;  ? URETEROSCOPY WITH HOLMIUM LASER LITHOTRIPSY Right 05/26/2015  ? Procedure: URETEROSCOPY WITH HOLMIUM LASER LITHOTRIPSY;  Surgeon: Hollice Espy, MD;  Location: [facility] ORS;  Service: Urology;  Laterality: Right;  ? URETEROSCOPY WITH HOLMIUM LASER LITHOTRIPSY Right 06/25/2016  ? Procedure: URETEROSCOPY WITH HOLMIUM LASER LITHOTRIPSY;  Surgeon: Nickie Retort, MD;  Location: [facility] ORS;  Service: Urology;  Laterality: Right;  ? ? ?SOCIAL HISTORY: ?Social History  ? ?Socioeconomic History  ? Marital status: Divorced  ?  Spouse name: Not on file  ? Number of children: Not on file  ? Years of education: Not on file  ? Highest education level: Not on file  ?Occupational History  ? Not on file  ?Tobacco Use  ? Smoking status: Every Day  ?  Packs/day: 1.00   ?  Years: 40.00  ?  Pack years: 40.00  ?  Types: Cigarettes  ? Smokeless tobacco: Never  ?Vaping Use  ? Vaping Use: Every day  ?Substance and Sexual Activity  ? Alcohol use: Not Currently  ?  Comment: socially  ? Drug

## 2021-06-25 LAB — THYROID PANEL WITH TSH
Free Thyroxine Index: 3.2 (ref 1.2–4.9)
T3 Uptake Ratio: 29 % (ref 24–39)
T4, Total: 11.1 ug/dL (ref 4.5–12.0)
TSH: 2.99 u[IU]/mL (ref 0.450–4.500)

## 2021-06-29 ENCOUNTER — Other Ambulatory Visit: Payer: Self-pay | Admitting: Family Medicine

## 2021-06-29 DIAGNOSIS — Z1231 Encounter for screening mammogram for malignant neoplasm of breast: Secondary | ICD-10-CM

## 2021-07-07 ENCOUNTER — Encounter: Payer: Self-pay | Admitting: Internal Medicine

## 2021-07-10 ENCOUNTER — Ambulatory Visit: Payer: 59

## 2021-07-14 ENCOUNTER — Ambulatory Visit
Admission: RE | Admit: 2021-07-14 | Discharge: 2021-07-14 | Disposition: A | Discharge status: Home / Self Care | Payer: 59 | Source: Ambulatory Visit | Attending: Family Medicine | Admitting: Family Medicine

## 2021-07-14 ENCOUNTER — Telehealth: Payer: Self-pay | Admitting: *Deleted

## 2021-07-14 DIAGNOSIS — C3432 Malignant neoplasm of lower lobe, left bronchus or lung: Secondary | ICD-10-CM | POA: Insufficient documentation

## 2021-07-14 MED ORDER — IOHEXOL 300 MG/ML  SOLN
100.0000 mL | Freq: Once | INTRAMUSCULAR | Status: AC | PRN
  Administered 2021-07-14: 100 mL via INTRAVENOUS

## 2021-07-14 MED FILL — Dexamethasone Sodium Phosphate Inj 100 MG/10ML: INTRAMUSCULAR | Qty: 1 | Status: AC

## 2021-07-14 NOTE — Telephone Encounter (Signed)
Pt has appt tomorrow

## 2021-07-14 NOTE — Telephone Encounter (Signed)
Called report  IMPRESSION: 1. Interval increase in size of the 2.5 cm spiculated left lower lobe pulmonary nodule with a new solid 5 mm nodule adjacent to the spiculated mass as well as increased size of the subcarinal and hilar lymph nodes, findings which are concerning for disease progression. 2. No significant interval change in the numerous scattered generally amorphous bilateral upper lobe predominant ground-glass opacities, which are again favored to reflect an infectious or inflammatory etiology. Continued attention on follow-up imaging suggested. 3. No evidence of metastatic disease within the abdomen or pelvis. 4. Hepatomegaly and hepatic steatosis. 5.  Aortic Atherosclerosis (ICD10-I70.0).     Electronically Signed   By: Dahlia Bailiff M.D.   On: 07/14/2021 11:31

## 2021-07-15 ENCOUNTER — Inpatient Hospital Stay: Payer: 59

## 2021-07-15 ENCOUNTER — Inpatient Hospital Stay (HOSPITAL_BASED_OUTPATIENT_CLINIC_OR_DEPARTMENT_OTHER): Payer: 59 | Admitting: Internal Medicine

## 2021-07-15 ENCOUNTER — Encounter: Payer: Self-pay | Admitting: Internal Medicine

## 2021-07-15 VITALS — BP 129/57 | HR 67 | Resp 16

## 2021-07-15 VITALS — BP 118/73 | HR 73 | Temp 98.1°F | Ht 65.0 in | Wt 191.0 lb

## 2021-07-15 DIAGNOSIS — C3432 Malignant neoplasm of lower lobe, left bronchus or lung: Secondary | ICD-10-CM

## 2021-07-15 DIAGNOSIS — Z5111 Encounter for antineoplastic chemotherapy: Secondary | ICD-10-CM | POA: Diagnosis not present

## 2021-07-15 LAB — CBC WITH DIFFERENTIAL/PLATELET
Abs Immature Granulocytes: 0.03 10*3/uL (ref 0.00–0.07)
Basophils Absolute: 0.1 10*3/uL (ref 0.0–0.1)
Basophils Relative: 1 %
Eosinophils Absolute: 0.2 10*3/uL (ref 0.0–0.5)
Eosinophils Relative: 2 %
HCT: 37.5 % (ref 36.0–46.0)
Hemoglobin: 11.8 g/dL — ABNORMAL LOW (ref 12.0–15.0)
Immature Granulocytes: 0 %
Lymphocytes Relative: 19 %
Lymphs Abs: 1.8 10*3/uL (ref 0.7–4.0)
MCH: 32.2 pg (ref 26.0–34.0)
MCHC: 31.5 g/dL (ref 30.0–36.0)
MCV: 102.2 fL — ABNORMAL HIGH (ref 80.0–100.0)
Monocytes Absolute: 1 10*3/uL (ref 0.1–1.0)
Monocytes Relative: 10 %
Neutro Abs: 6.4 10*3/uL (ref 1.7–7.7)
Neutrophils Relative %: 68 %
Platelets: 236 10*3/uL (ref 150–400)
RBC: 3.67 MIL/uL — ABNORMAL LOW (ref 3.87–5.11)
RDW: 15.9 % — ABNORMAL HIGH (ref 11.5–15.5)
WBC: 9.4 10*3/uL (ref 4.0–10.5)
nRBC: 0 % (ref 0.0–0.2)

## 2021-07-15 LAB — COMPREHENSIVE METABOLIC PANEL
ALT: 23 U/L (ref 0–44)
AST: 24 U/L (ref 15–41)
Albumin: 3.5 g/dL (ref 3.5–5.0)
Alkaline Phosphatase: 67 U/L (ref 38–126)
Anion gap: 8 (ref 5–15)
BUN: 10 mg/dL (ref 8–23)
CO2: 26 mmol/L (ref 22–32)
Calcium: 8.5 mg/dL — ABNORMAL LOW (ref 8.9–10.3)
Chloride: 106 mmol/L (ref 98–111)
Creatinine, Ser: 0.82 mg/dL (ref 0.44–1.00)
GFR, Estimated: >60 mL/min (ref >60)
Glucose, Bld: 120 mg/dL — ABNORMAL HIGH (ref 70–99)
Potassium: 3.6 mmol/L (ref 3.5–5.1)
Sodium: 140 mmol/L (ref 135–145)
Total Bilirubin: 0.3 mg/dL (ref 0.3–1.2)
Total Protein: 7 g/dL (ref 6.5–8.1)

## 2021-07-15 MED ORDER — HEPARIN SOD (PORK) LOCK FLUSH 100 UNIT/ML IV SOLN
500.0000 [IU] | Freq: Once | INTRAVENOUS | Status: AC | PRN
  Administered 2021-07-15: 500 [IU]
  Filled 2021-07-15: qty 5

## 2021-07-15 MED ORDER — SODIUM CHLORIDE 0.9 % IV SOLN
200.0000 mg | Freq: Once | INTRAVENOUS | Status: AC
  Administered 2021-07-15: 200 mg via INTRAVENOUS
  Filled 2021-07-15: qty 200

## 2021-07-15 MED ORDER — ONDANSETRON HCL 4 MG/2ML IJ SOLN
8.0000 mg | Freq: Once | INTRAMUSCULAR | Status: AC
  Administered 2021-07-15: 8 mg via INTRAVENOUS
  Filled 2021-07-15: qty 4

## 2021-07-15 MED ORDER — PROCHLORPERAZINE MALEATE 10 MG PO TABS
10.0000 mg | ORAL_TABLET | Freq: Once | ORAL | Status: AC
  Administered 2021-07-15: 10 mg via ORAL
  Filled 2021-07-15: qty 1

## 2021-07-15 MED ORDER — SODIUM CHLORIDE 0.9 % IV SOLN
10.0000 mg | Freq: Once | INTRAVENOUS | Status: AC
  Administered 2021-07-15: 10 mg via INTRAVENOUS
  Filled 2021-07-15: qty 10

## 2021-07-15 MED ORDER — SODIUM CHLORIDE 0.9% FLUSH
10.0000 mL | INTRAVENOUS | Status: DC | PRN
  Administered 2021-07-15: 10 mL
  Filled 2021-07-15: qty 10

## 2021-07-15 MED ORDER — SODIUM CHLORIDE 0.9 % IV SOLN
Freq: Once | INTRAVENOUS | Status: AC
  Filled 2021-07-15: qty 250

## 2021-07-15 MED ORDER — SODIUM CHLORIDE 0.9 % IV SOLN
500.0000 mg/m2 | Freq: Once | INTRAVENOUS | Status: AC
  Administered 2021-07-15: 1000 mg via INTRAVENOUS
  Filled 2021-07-15: qty 40

## 2021-07-15 MED ORDER — SODIUM CHLORIDE 0.9 % IV SOLN: 8.0000 mg | Freq: Once | INTRAVENOUS | Status: DC

## 2021-07-15 NOTE — Patient Instructions (Signed)
[facility]CER CTR AT [place]  Discharge Instructions: Thank you for choosing [facility] to provide your oncology and hematology care.  If you have a lab appointment with the [facility], please go directly to the [facility] and check in at the registration area.  Wear comfortable clothing and clothing appropriate for easy access to any Portacath or PICC line.   We strive to give you quality time with your provider. You may need to reschedule your appointment if you arrive late (15 or more minutes).  Arriving late affects you and other patients whose appointments are after yours.  Also, if you miss three or more appointments without notifying the office, you may be dismissed from the clinic at the provider's discretion.      For prescription refill requests, have your pharmacy contact our office and allow 72 hours for refills to be completed.    Today you received the following chemotherapy and/or immunotherapy agents Keytruda & Alimta      To help prevent nausea and vomiting after your treatment, we encourage you to take your nausea medication as directed.  BELOW ARE SYMPTOMS THAT SHOULD BE REPORTED IMMEDIATELY: *FEVER GREATER THAN 100.4 F (38 C) OR HIGHER *CHILLS OR SWEATING *NAUSEA AND VOMITING THAT IS NOT CONTROLLED WITH YOUR NAUSEA MEDICATION *UNUSUAL SHORTNESS OF BREATH *UNUSUAL BRUISING OR BLEEDING *URINARY PROBLEMS (pain or burning when urinating, or frequent urination) *BOWEL PROBLEMS (unusual diarrhea, constipation, pain near the anus) TENDERNESS IN MOUTH AND THROAT WITH OR WITHOUT PRESENCE OF ULCERS (sore throat, sores in mouth, or a toothache) UNUSUAL RASH, SWELLING OR PAIN  UNUSUAL VAGINAL DISCHARGE OR ITCHING   Items with * indicate a potential emergency and should be followed up as soon as possible or go to the Emergency Department if any problems should occur.  Please show the CHEMOTHERAPY ALERT CARD or IMMUNOTHERAPY ALERT CARD at  check-in to the Emergency Department and triage nurse.  Should you have questions after your visit or need to cancel or reschedule your appointment, please contact [facility] CANCER [place] AT [place]  [phone] and follow the prompts.  Office hours are 8:00 a.m. to 4:30 p.m. Monday - Friday. Please note that voicemails left after 4:00 p.m. may not be returned until the following business day.  We are closed weekends and major holidays. You have access to a nurse at all times for urgent questions. Please call the main number to the clinic [phone] and follow the prompts.  For any non-urgent questions, you may also contact your provider using MyChart. We now offer e-Visits for anyone 68 and older to request care online for non-urgent symptoms. For details visit [email or web address].   Also download the MyChart app! Go to the app store, search "MyChart", open the app, select [facility], and log in with your MyChart username and password.  Due to Covid, a mask is required upon entering the hospital/clinic. If you do not have a mask, one will be given to you upon arrival. For doctor visits, patients may have 1 support person aged 45 or older with them. For treatment visits, patients cannot have anyone with them due to current Covid guidelines and our immunocompromised population.

## 2021-07-15 NOTE — Assessment & Plan Note (Addendum)
#  Stage IV -non-small cell favor adenocarcinoma; s/p carbo-alimta-keytruda. CUrrently on Alimta-keytruda maintrence MAY 30th, 2023- Interval increase in size of the 2.5 cm spiculated left lower lobe pulmonary nodule with a new solid 5 mm nodule adjacent to the spiculated mass as well as increased size of the subcarinal and hilar lymph nodes, findings which are concerning for disease progression. Will order PET scan for further evaluation. Will refer to [place] re: for consideration of SBRT   #Proceed with  maintaintnance  Alimta Keytruda today Labs today reviewed;mild anemia- Hb 11;   acceptable for treatment today.  See above.   # COPD [Dr.A]/allergies likely secondary to poorly controlled COPD.  Recommend using albuterol 3-4 times a day and also compliance with Advair.  Recommend Singulair.  Ambulatory heart rate/pulse ox-80s; greater than 95.  No concern for immunotherapy induced pneumonitis.  # Left chest wall pain:-improved/resolved-using hydrocodone qhs-STABLE;   # Right eye vision changes-3.6 mm lesion noted in the right eye;s/p ophthalmology evaluation at [facility]. 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 STABLE;   # Diabetes-A1c 6.9 [AUG 2022]-Fatsing-95.  Continue glipizide 5 mg BID.STABLE;   # Diarrhea-G-1-on immoiudm/ Oral mucositis- G-1-STABLE;    # Nausea- G-1 -Zofran Dex Compazine Zofran- [if worse would consider Aloxi] STABLE;   # Hypocalcemia: on Vit D BID [8.5]. check VitD levels.   #Incidental findings on Imaging  CT scan 2023: I reviewed/discussed/counseled the patient.   # Mediport placement-s/p dye study c tPA-functioning.STABLE  b12-5/31  #DISPOSITION: # PET scan ASAP # refer to Dr.Chrystal re: lung cancer # Alimta-keytruda today; b12  # 3 week- MD- labs- cbc/cmp; 25-OH Vit D levels - Alimta-keytruda; Dr.B  # I reviewed the blood work- with the patient in detail;  also reviewed the imaging independently [as summarized above]; and with the patient in detail.

## 2021-07-15 NOTE — Progress Notes (Signed)
Per schedule, patient to have B12 injection today. Last B12 injection was 06/24/21. Per Dr. Rogue Bussing, no B12 injection needed today.

## 2021-07-15 NOTE — Progress Notes (Signed)
[facility] CONSULT NOTE  Patient Care Team: Dion Body, MD as PCP - General (Family Medicine) Leata Mouse (Inactive) Byrnett, Forest Gleason, MD (General Surgery) Telford Nab, RN as Oncology Nurse Navigator Cammie Sickle, MD as Consulting Physician (Oncology)  CHIEF COMPLAINTS/PURPOSE OF CONSULTATION: lung cancer  Oncology History Overview Note  IMPRESSION: Hypermetabolic solid pulmonary nodule of the left lower lobe, favor primary lung malignancy.   Numerous hypermetabolic nodules of the lower left pleura, compatible with pleural metastatic disease.   Hypermetabolic subcarinal and left hilar lymph nodes, compatible with metastatic disease.   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 Recommend attention on follow-up.   Mild asymmetric FDG uptake below mediastinal blood pool within a small nodule of the left parotid gland, favored to be benign. Recommend attention on follow-up.   No evidence of metastatic disease in the abdomen or pelvis.   No evidence of osseous metastatic disease.   ---------------------  # MARCH 2022- incidental LLL [ 0.9 cm] s/p COVID vaccine; [Dr.Aleskerov]; July 2022- left pleural pain-   #  #October 2022 MRI brain-punctate lesion-asymptomatic [D/w- Dr.Vaslow]; December 2022 MRI negative for any acute process; chronic sclerosis of hippocampus-clinically insignificant.  COPD-non-complaint    ------------------   DIAGNOSIS:  A. PLEURAL MASS, LEFT CHEST; BIOPSY:  - DIAGNOSTIC OF MALIGNANCY.  - NON-SMALL CELL CARCINOMA, FAVOR ADENOCARCINOMA.   Comment:  The tumor cells are positive for CK7 and TTF1 (patchy).  They are  negative for p40.   # 12/10/2020-carbo Alimta; cycle #1 [Keytruda pending insurance]  #NGS PD-L1  TPS 1%   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[place])  05/02/2015 Initial Diagnosis   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H[facility])   Cancer of lower lobe of left lung ([place])  12/02/2020 Initial Diagnosis   Cancer of lower lobe of left lung ([place])   12/10/2020 Cancer Staging   Staging form: Lung, AJCC 8th Edition - Clinical: Stage IVA (cT4, cN3, cM1a) - Signed by Cammie Sickle, MD on 12/10/2020    12/10/2020 -  Chemotherapy   Patient is on Treatment Plan : LUNG CARBOplatin / Pemetrexed / Pembrolizumab q21d Induction x 4 cycles / Maintenance Pemetrexed + Pembrolizumab         HISTORY OF PRESENTING ILLNESS: Ambulating independently.  Alone.  Felicia Manning 66 y.o.  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   Patient noted to have intermittent left chest wall pain mild.  She has mild cough however she has been taking antitussives prescribed by PCP.  Mild diarrhea- mild nausea currently improved with antiemetics.  Appetite is good.  No weight loss.  Review of Systems  Constitutional:  Positive for malaise/fatigue. Negative for chills, diaphoresis, fever and weight loss.  HENT:  Negative for nosebleeds and sore throat.   Eyes:  Negative for double vision.  Respiratory:  Positive for cough and sputum production. Negative for hemoptysis and shortness of breath.   Cardiovascular:  Negative for palpitations, orthopnea and leg swelling.  Gastrointestinal:  Negative for abdominal pain, blood in stool, constipation, diarrhea, heartburn, melena, nausea and vomiting.  Genitourinary:  Negative for dysuria, frequency and urgency.  Musculoskeletal:  Negative for back pain and joint pain.  Skin: Negative.  Negative for itching and rash.  Neurological:  Negative for dizziness, tingling, focal weakness and weakness.  Endo/Heme/Allergies:  Does not  bruise/bleed easily.  Psychiatric/Behavioral:  Negative for depression. The patient is not nervous/anxious and does not have insomnia.     MEDICAL HISTORY:  Past Medical History:  Diagnosis Date   Barrett's esophagus    Dysrhythmia    stress related at work.   GERD (gastroesophageal reflux disease)    Hepatitis C 2008   Hyperlipidemia    Hypertension    Hypothyroidism    Nephrolithiasis    Non-small cell carcinoma of left lung (HCC)    Osteoporosis    Pulmonary mass    Thyroid cancer ([place]) 2012   Thyroid cancer ([place])    Tumor    tumor per pt back of rt eye    SURGICAL HISTORY: Past Surgical History:  Procedure Laterality Date   ABDOMINAL HYSTERECTOMY  2006   COLONOSCOPY WITH PROPOFOL N/A 04/25/2015   Procedure: COLONOSCOPY WITH PROPOFOL;  Surgeon: Josefine Class, MD;  Location: [facility]COPY;  Service: Endoscopy;  Laterality: N/A;   CYSTOSCOPY W/ RETROGRADES Bilateral 05/19/2015   Procedure: CYSTOSCOPY WITH RETROGRADE PYELOGRAM;  Surgeon: Hollice Espy, MD;  Location: [facility] ORS;  Service: Urology;  Laterality: Bilateral;   CYSTOSCOPY W/ URETERAL STENT PLACEMENT Right 05/26/2015   Procedure: CYSTOSCOPY WITH STENT REPLACEMENT;  Surgeon: Hollice Espy, MD;  Location: [facility] ORS;  Service: Urology;  Laterality: Right;   CYSTOSCOPY WITH STENT PLACEMENT Right 05/19/2015   Procedure: CYSTOSCOPY WITH STENT PLACEMENT;  Surgeon: Hollice Espy, MD;  Location: [facility] ORS;  Service: Urology;  Laterality: Right;   CYSTOSCOPY WITH STENT PLACEMENT Right 05/26/2015   Procedure: CYSTOSCOPY WITH STENT PLACEMENT;  Surgeon: Hollice Espy, MD;  Location: [facility] ORS;  Service: Urology;  Laterality: Right;   CYSTOSCOPY WITH STENT PLACEMENT Right 06/25/2016   Procedure: CYSTOSCOPY WITH STENT PLACEMENT;  Surgeon: Nickie Retort, MD;  Location: [facility] ORS;  Service: Urology;  Laterality: Right;   CYSTOSCOPY WITH STENT PLACEMENT Left 10/31/2017   Procedure: CYSTOSCOPY WITH STENT PLACEMENT;  Surgeon:  Hollice Espy, MD;  Location: [facility] ORS;  Service: Urology;  Laterality: Left;   CYSTOSCOPY/URETEROSCOPY/HOLMIUM LASER/STENT PLACEMENT Left 07/23/2015   Procedure: CYSTOSCOPY/URETEROSCOPY/HOLMIUM LASER/STENT PLACEMENT/RETROGRADE PYELOGRAM;  Surgeon: Hollice Espy, MD;  Location: [facility] ORS;  Service: Urology;  Laterality: Left;   CYSTOSCOPY/URETEROSCOPY/HOLMIUM LASER/STENT PLACEMENT Left 11/16/2017   Procedure: CYSTOSCOPY/URETEROSCOPY/HOLMIUM LASER/STENT Exchange;  Surgeon: Hollice Espy, MD;  Location: [facility] ORS;  Service: Urology;  Laterality: Left;   ESOPHAGOGASTRODUODENOSCOPY (EGD) WITH PROPOFOL N/A 04/25/2015   Procedure: ESOPHAGOGASTRODUODENOSCOPY (EGD) WITH PROPOFOL;  Surgeon: Josefine Class, MD;  Location: [facility] ENDOSCOPY;  Service: Endoscopy;  Laterality: N/A;   EYE SURGERY Bilateral 1964   lazy eye repair   EYE SURGERY Bilateral 2001   lazy eye repair   IR IMAGING GUIDED PORT INSERTION  12/08/2020   LITHOTRIPSY  2009   THYROIDECTOMY  2010   URETEROSCOPY WITH HOLMIUM LASER LITHOTRIPSY Right 05/19/2015   Procedure: URETEROSCOPY WITH HOLMIUM LASER LITHOTRIPSY;  Surgeon: Hollice Espy, MD;  Location: [facility] ORS;  Service: Urology;  Laterality: Right;   URETEROSCOPY WITH HOLMIUM LASER LITHOTRIPSY Right 05/26/2015   Procedure: URETEROSCOPY WITH HOLMIUM LASER LITHOTRIPSY;  Surgeon: Hollice Espy, MD;  Location: [facility] ORS;  Service: Urology;  Laterality: Right;   URETEROSCOPY WITH HOLMIUM LASER LITHOTRIPSY Right 06/25/2016   Procedure: URETEROSCOPY WITH HOLMIUM LASER LITHOTRIPSY;  Surgeon: Nickie Retort, MD;  Location: [facility] ORS;  Service: Urology;  Laterality: Right;    SOCIAL HISTORY: Social History   Socioeconomic History  Marital status: Divorced    Spouse name: Not on file   Number of children: Not on file   Years of education: Not on file   Highest education level: Not on file  Occupational History   Not on file  Tobacco Use   Smoking status: Every Day    Packs/day: 1.00     Years: 40.00    Pack years: 40.00    Types: Cigarettes   Smokeless tobacco: Never  Vaping Use   Vaping Use: Every day  Substance and Sexual Activity   Alcohol use: Not Currently    Comment: socially   Drug use: No   Sexual activity: Not Currently    Birth control/protection: None  Other Topics Concern   Not on file  Social History Narrative   Smoking: 1p 1-2 days since 17 years; alcohol: rare; 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     Social Determinants of Health   Financial Resource Strain: Not on file  Food Insecurity: Not on file  Transportation Needs: Not on file  Physical Activity: Not on file  Stress: Not on file  Social Connections: Not on file  Intimate Partner Violence: Not on file    FAMILY HISTORY: Family History  Problem Relation Age of Onset   CAD Mother    Heart disease Mother    Dementia Father    Bladder Cancer Neg Hx    Prostate cancer Neg Hx    Kidney cancer Neg Hx    Breast cancer Neg Hx     ALLERGIES:  has No Known Allergies.  MEDICATIONS:  Current Outpatient Medications  Medication Sig Dispense Refill   Accu-Chek Softclix Lancets lancets Check glucose once a day 100 each 12   albuterol (VENTOLIN HFA) 108 (90 Base) MCG/ACT inhaler Inhale 2 puffs into the lungs every 6 (six) hours as needed for wheezing or shortness of breath. 8 g 2   amLODipine (NORVASC) 5 MG tablet Take 5 mg by mouth every morning.      blood glucose meter kit and supplies KIT Dispense based on patient and insurance preference. Use up to three times daily as directed. 1 each 0   cholecalciferol (VITAMIN D) 1000 units tablet Take 2,000 Units by mouth daily.     Cranberry-Vitamin C-Probiotic (AZO CRANBERRY PO) Take 1 tablet by mouth daily.     fluticasone-salmeterol (ADVAIR HFA) 45-21 MCG/ACT inhaler Inhale 2 puffs into the lungs 2 (two) times daily. 1 each 12   folic acid (FOLVITE) 1 MG tablet Take 1  tablet (1 mg total) by mouth daily. 90 tablet 1   glipiZIDE (GLUCOTROL) 5 MG tablet Take 1 tablet (5 mg total) by mouth 2 (two) times daily before a meal. 180 tablet 1   HYDROcodone-acetaminophen (NORCO/VICODIN) 5-325 MG tablet Take 1 tablet by mouth every 8 (eight) hours as needed for moderate pain. 60 tablet 0   levothyroxine (SYNTHROID) 175 MCG tablet Sat and Sun ON AN EMPTY STOMACH WITH A GLASS OF WATER AT LEAST 30-60 MINUTES BEFORE BREAKFAST     levothyroxine (SYNTHROID, LEVOTHROID) 150 MCG tablet Take 150 mcg by mouth at bedtime.      lidocaine-prilocaine (EMLA) cream Apply 1 application topically as needed. 30 g 0   loperamide (IMODIUM) 2 MG capsule Take by mouth as needed for diarrhea or loose stools.     losartan (COZAAR) 100 MG tablet Take 100 mg by mouth every morning.      metoprolol succinate (TOPROL-XL) 25  MG 24 hr tablet Take 25 mg by mouth every morning.   1   montelukast (SINGULAIR) 10 MG tablet Take 1 tablet (10 mg total) by mouth at bedtime. 30 tablet 1   omeprazole (PRILOSEC) 20 MG capsule Take 20 mg by mouth daily before breakfast.      ondansetron (ZOFRAN) 8 MG tablet Take 1 tablet (8 mg total) by mouth every 8 (eight) hours as needed for nausea or vomiting. Start on the third day after chemotherapy 30 tablet 2   prochlorperazine (COMPAZINE) 10 MG tablet Take 1 tablet (10 mg total) by mouth every 6 (six) hours as needed for nausea or vomiting. 30 tablet 2   nystatin (MYCOSTATIN) 100000 UNIT/ML suspension Swish and swallow. Don't drink fluids immediately following administration. (Patient not taking: Reported on 06/24/2021) 400 mL 0   No current facility-administered medications for this visit.      Marland Kitchen  PHYSICAL EXAMINATION: ECOG PERFORMANCE STATUS: 1 - Symptomatic but completely ambulatory  Vitals:   07/15/21 0851  BP: 118/73  Pulse: 73  Temp: 98.1 F (36.7 C)  SpO2: 99%    Filed Weights   07/15/21 0851  Weight: 191 lb (86.6 kg)   No significant right upper  quadrant tenderness noted.  Physical Exam Vitals and nursing note reviewed.  HENT:     Head: Normocephalic and atraumatic.     Mouth/Throat:     Pharynx: Oropharynx is clear.  Eyes:     Extraocular Movements: Extraocular movements intact.     Pupils: Pupils are equal, round, and reactive to light.  Cardiovascular:     Rate and Rhythm: Normal rate and regular rhythm.  Pulmonary:     Comments: Decreased breath sounds bilaterally.  Abdominal:     Palpations: Abdomen is soft.  Musculoskeletal:        General: Normal range of motion.     Cervical back: Normal range of motion.  Skin:    General: Skin is warm.  Neurological:     General: No focal deficit present.     Mental Status: She is alert and oriented to person, place, and time.  Psychiatric:        Behavior: Behavior normal.        Judgment: Judgment normal.     LABORATORY DATA:  I have reviewed the data as listed Lab Results  Component Value Date   WBC 9.4 07/15/2021   HGB 11.8 (L) 07/15/2021   HCT 37.5 07/15/2021   MCV 102.2 (H) 07/15/2021   PLT 236 07/15/2021   Recent Labs    06/03/21 0846 06/24/21 1306 07/15/21 0858  NA 136 138 140  K 3.6 3.3* 3.6  CL 105 107 106  CO2 25 26 26   GLUCOSE 123* 108* 120*  BUN 14 9 10   CREATININE 0.87 0.78 0.82  CALCIUM 8.7* 8.4* 8.5*  GFRNONAA >60 >60 >60  PROT 7.1 6.8 7.0  ALBUMIN 3.5 3.5 3.5  AST 18 22 24   ALT 18 19 23   ALKPHOS 57 69 67  BILITOT 0.7 0.7 0.3    RADIOGRAPHIC STUDIES: I have personally reviewed the radiological images as listed and agreed with the findings in the report. CT CHEST ABDOMEN PELVIS W CONTRAST  Result Date: 07/14/2021 CLINICAL DATA:  Metastatic left lower lobe non-small cell lung cancer, restaging. Ongoing chemo therapy and immunotherapy. Patient complains of chest pain and dry cough with shortness of breath x3 weeks. * Tracking Code: BO * EXAM: CT CHEST, ABDOMEN, AND PELVIS WITH CONTRAST TECHNIQUE: Multidetector CT imaging  of the chest,  abdomen and pelvis was performed following the standard protocol during bolus administration of intravenous contrast. RADIATION DOSE REDUCTION: This exam was performed according to the departmental dose-optimization program which includes automated exposure control, adjustment of the mA and/or kV according to patient size and/or use of iterative reconstruction technique. CONTRAST:  127m OMNIPAQUE IOHEXOL 300 MG/ML  SOLN COMPARISON:  Multiple priors including most recent CT March 23, 2021. FINDINGS: CT CHEST FINDINGS Cardiovascular: Right chest wall Port-A-Cath with tip in the right atrium. Aortic and branch vessel atherosclerosis without abdominal aortic aneurysm. No central pulmonary embolus on this nondedicated study. Normal size heart. No significant pericardial effusion/thickening. Mediastinum/Nodes: Interval increase in size of the subcarinal and left hilar lymph nodes. Previously indexed left hilar lymph node now measure 14 mm in short axis on image 27/2, previously 9 mm. Left hilar lymph node had become too small to accurately characterize on most recent prior imaging but was enlarged on imaging studies prior to that including January 16, 2021 now measures 11 mm in short axis on image 30/2. Additional prominent mediastinal lymph nodes not pathologically enlarged by size criteria are not significantly changed from prior studies. No right hilar or supraclavicular adenopathy. No axillary adenopathy. Small hiatal hernia. Lungs/Pleura: Interval increase in size in the spiculated left lower lobe pulmonary nodule in the inferior costophrenic recess which now measures 2.5 x 1.9 cm on image 124/3 previously 1.6 x 1.2 cm. New solid left lower lobe pulmonary nodule adjacent to the spiculated lesion best seen on coronal image 115/4. Similar appearance of the previously hypermetabolic pleural nodules about the left lower lobe measuring no greater than 1-2 mm in thickness for instance on image 49/2 and image 43/2. No  significant interval change in the numerous scattered generally amorphous bilateral upper lobe predominant ground-glass opacities for instance on image 42/3, 34/3, and 20/3. No pleural effusion.  No pneumothorax. Musculoskeletal: No aggressive lytic or blastic lesion of bone. Subacute left ninth rib fracture on image 135/3. Multilevel degenerative changes spine and degenerative changes shoulders. CT ABDOMEN PELVIS FINDINGS Hepatobiliary: Hepatomegaly measuring 21 cm in maximum coronal span. Hepatic steatosis. Stable hyperenhancing segment V 11 mm focus adjacent to the gallbladder fossa on image 62/2 with associated transient hepatic attenuation difference on image 65/2, unchanged dating back to at least 2019, consistent with a benign finding such as a flash filling hemangioma. Benign cyst hepatic cysts in segment V in the caudate lobe. Cholelithiasis without findings of acute cholecystitis. No gallbladder wall thickening, or biliary dilatation. Pancreas: No biliary ductal dilation or evidence of acute inflammation. Soft tissue nodule along the tail of the pancreas measuring 18 mm on image 59/2 is stable dating back to Jun 18, 2017 and most consistent with a splenule. Spleen: No splenomegaly or focal splenic lesion. Adrenals/Urinary Tract: Bilateral adrenal glands are within normal limits. No hydronephrosis. Bilateral renal cysts requiring no follow-up. Nonobstructive bilateral renal stones. Calcified gonadal vein phleboliths on the course of the left ureter. Stomach/Bowel: Radiopaque enteric contrast material traverses the hepatic flexure. Stomach is minimally distended without overt abnormal wall thickening. No pathologic dilation of small or large bowel. The appendix and terminal ileum appear normal. Colonic diverticulosis without findings of acute diverticulitis. Vascular/Lymphatic: Aortic and branch vessel atherosclerosis without abdominal aortic aneurysm. Pathologically enlarged abdominal or pelvic lymph nodes.  Reproductive: Status post hysterectomy.  No suspicious adnexal mass. Other: Stable benign simple appearing cyst overlying the right psoas muscle measures 3 cm and most likely reflects a lymphocele or postoperative seroma. No discrete  peritoneal or omental nodularity. No significant abdominopelvic free fluid. Musculoskeletal: Multilevel degenerative changes spine. No aggressive lytic or blastic lesion of bone. IMPRESSION: 1. Interval increase in size of the 2.5 cm spiculated left lower lobe pulmonary nodule with a new solid 5 mm nodule adjacent to the spiculated mass as well as increased size of the subcarinal and hilar lymph nodes, findings which are concerning for disease progression. 2. No significant interval change in the numerous scattered generally amorphous bilateral upper lobe predominant ground-glass opacities, which are again favored to reflect an infectious or inflammatory etiology. Continued attention on follow-up imaging suggested. 3. No evidence of metastatic disease within the abdomen or pelvis. 4. Hepatomegaly and hepatic steatosis. 5.  Aortic Atherosclerosis (ICD10-I70.0). Electronically Signed   By: Dahlia Bailiff M.D.   On: 07/14/2021 11:31    ASSESSMENT & PLAN:   Cancer of lower lobe of left lung ([place]) #Stage IV -non-small cell favor adenocarcinoma; s/p carbo-alimta-keytruda. CUrrently on Alimta-keytruda maintrence MAY 30th, 2023- Interval increase in size of the 2.5 cm spiculated left lower lobe pulmonary nodule with a new solid 5 mm nodule adjacent to the spiculated mass as well as increased size of the subcarinal and hilar lymph nodes, findings which are concerning for disease progression. Will order PET scan for further evaluation. Will refer to [place] re: for consideration of SBRT   #Proceed with  maintaintnance  Alimta Keytruda today Labs today reviewed;mild anemia- Hb 11;   acceptable for treatment today.  See above.   # COPD [Dr.A]/allergies likely secondary to poorly  controlled COPD.  Recommend using albuterol 3-4 times a day and also compliance with Advair.  Recommend Singulair.  Ambulatory heart rate/pulse ox-80s; greater than 95.  No concern for immunotherapy induced pneumonitis.  # Left chest wall pain:-improved/resolved-using hydrocodone qhs-STABLE;   # Right eye vision changes-3.6 mm lesion noted in the right eye;s/p ophthalmology evaluation [facility]. 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 STABLE;   # Diabetes-A1c 6.9 [AUG 2022]-Fatsing-95.  Continue glipizide 5 mg BID.STABLE;   # Diarrhea-G-1-on immoiudm/ Oral mucositis- G-1-STABLE;    # Nausea- G-1 -Zofran Dex Compazine Zofran- [if worse would consider Aloxi] STABLE;   # Hypocalcemia: on Vit D BID [8.5]. check VitD levels.   #Incidental findings on Imaging  CT scan 2023: I reviewed/discussed/counseled the patient.   # Mediport placement-s/p dye study c tPA-functioning.STABLE  b12-5/31  #DISPOSITION: # PET scan ASAP # refer to Dr.Chrystal re: lung cancer # Alimta-keytruda today; b12  # 3 week- MD- labs- cbc/cmp; 25-OH Vit D levels - Alimta-keytruda; Dr.B  # I reviewed the blood work- with the patient in detail; also reviewed the imaging independently [as summarized above]; and with the patient in detail.              All questions were answered. The patient knows to call the clinic with any problems, questions or concerns.       Cammie Sickle, MD 07/15/2021 9:57 AM

## 2021-07-15 NOTE — Progress Notes (Signed)
CT results.  Having some pain left chest unsure if reflux.

## 2021-07-15 NOTE — Addendum Note (Signed)
Addended by: Leeann Must on: 07/15/2021 10:06 AM   Modules accepted: Orders

## 2021-07-21 ENCOUNTER — Encounter: Payer: Self-pay | Admitting: Internal Medicine

## 2021-07-21 ENCOUNTER — Inpatient Hospital Stay: Payer: 59 | Attending: Internal Medicine | Admitting: Hospice and Palliative Medicine

## 2021-07-21 DIAGNOSIS — C3432 Malignant neoplasm of lower lobe, left bronchus or lung: Secondary | ICD-10-CM

## 2021-07-21 NOTE — Progress Notes (Signed)
Virtual Visit via Telephone Note  I connected with [name] on 07/21/21 at  1:20 PM EDT by telephone and verified that I am speaking with the correct person using two identifiers.  Location: Patient: Home Provider: Clinic   I discussed the limitations, risks, security and privacy concerns of performing an evaluation and management service by telephone and the availability of in person appointments. I also discussed with the patient that there may be a patient responsible charge related to this service. The patient expressed understanding and agreed to proceed.   History of Present Illness: Felicia Manning is a 66 y.o. female with multiple medical problems including including COPD, diabetes, and stage IV non-small cell lung cancer, initially diagnosed October 2022, on chemotherapy/immunotherapy.  Patient was referred to palliative care to discuss goals and provide ongoing support for symptoms.   Observations/Objective: I called and spoke with patient by phone.    Patient reports that she is doing reasonably well.  Unfortunately, recent CT revealed disease progression.  Patient has been referred to radiation oncology.  Patient said that she was somewhat apprehensive to start radiation but is interested in speaking with Dr. Baruch Gouty regarding options.  Patient denies significant symptomatic burden at present but is fearful of pain.  Encouraged her to call if she starts having poorly controlled symptoms and we can discuss management.  Patient's granddaughter was staying with her but has moved back in with her mother.  Patient says that she has moved her bedroom downstairs in anticipation of needing to provide more independent care.  We will consult community palliative care to be involved.  Patient is also interested in speaking with social work to explore resources and Medicaid.  Assessment and Plan: Stage IV NSCLC -disease progression on Alimta Keytruda maintenance.  Patient pending  PET scan.  Was referred for radiation oncology.  Referrals to SW and community PC.   Follow Up Instructions: Telephone visit 1-2 months   I discussed the assessment and treatment plan with the patient. The patient was provided an opportunity to ask questions and all were answered. The patient agreed with the plan and demonstrated an understanding of the instructions.   The patient was advised to call back or seek an in-person evaluation if the symptoms worsen or if the condition fails to improve as anticipated.  I provided 10 minutes of non-face-to-face time during this encounter.   Irean Hong, NP

## 2021-07-22 ENCOUNTER — Other Ambulatory Visit: Payer: Self-pay | Admitting: Nurse Practitioner

## 2021-07-22 DIAGNOSIS — C3432 Malignant neoplasm of lower lobe, left bronchus or lung: Secondary | ICD-10-CM

## 2021-07-23 ENCOUNTER — Ambulatory Visit
Admission: RE | Admit: 2021-07-23 | Discharge: 2021-07-23 | Disposition: A | Discharge status: Home / Self Care | Payer: 59 | Source: Ambulatory Visit | Attending: Radiation Oncology | Admitting: Radiation Oncology

## 2021-07-23 ENCOUNTER — Encounter
Admission: RE | Admit: 2021-07-23 | Discharge: 2021-07-23 | Disposition: A | Discharge status: Home / Self Care | Payer: 59 | Source: Ambulatory Visit | Attending: Internal Medicine | Admitting: Internal Medicine

## 2021-07-23 ENCOUNTER — Inpatient Hospital Stay: Payer: 59 | Admitting: Licensed Clinical Social Worker

## 2021-07-23 VITALS — BP 118/69 | HR 75 | Temp 98.4°F | Resp 18 | Ht 66.0 in | Wt 187.0 lb

## 2021-07-23 DIAGNOSIS — E039 Hypothyroidism, unspecified: Secondary | ICD-10-CM | POA: Insufficient documentation

## 2021-07-23 DIAGNOSIS — C3432 Malignant neoplasm of lower lobe, left bronchus or lung: Secondary | ICD-10-CM | POA: Diagnosis present

## 2021-07-23 DIAGNOSIS — E785 Hyperlipidemia, unspecified: Secondary | ICD-10-CM | POA: Insufficient documentation

## 2021-07-23 DIAGNOSIS — Z7984 Long term (current) use of oral hypoglycemic drugs: Secondary | ICD-10-CM | POA: Diagnosis not present

## 2021-07-23 DIAGNOSIS — Z8719 Personal history of other diseases of the digestive system: Secondary | ICD-10-CM | POA: Insufficient documentation

## 2021-07-23 DIAGNOSIS — Z85850 Personal history of malignant neoplasm of thyroid: Secondary | ICD-10-CM | POA: Diagnosis not present

## 2021-07-23 DIAGNOSIS — F17210 Nicotine dependence, cigarettes, uncomplicated: Secondary | ICD-10-CM | POA: Insufficient documentation

## 2021-07-23 DIAGNOSIS — I10 Essential (primary) hypertension: Secondary | ICD-10-CM | POA: Diagnosis not present

## 2021-07-23 DIAGNOSIS — Z7951 Long term (current) use of inhaled steroids: Secondary | ICD-10-CM | POA: Diagnosis not present

## 2021-07-23 DIAGNOSIS — K219 Gastro-esophageal reflux disease without esophagitis: Secondary | ICD-10-CM | POA: Diagnosis not present

## 2021-07-23 DIAGNOSIS — Z79899 Other long term (current) drug therapy: Secondary | ICD-10-CM | POA: Diagnosis not present

## 2021-07-23 MED ORDER — FLUDEOXYGLUCOSE F - 18 (FDG) INJECTION
9.9000 | Freq: Once | INTRAVENOUS | Status: AC | PRN
  Administered 2021-07-23: 10.82 via INTRAVENOUS

## 2021-07-23 NOTE — Progress Notes (Signed)
Ali Chuk CSW Progress Note  Clinical Education officer, museum contacted patient by phone to discuss Medicaid criteria.  CSW received referral from Palliative Care NP, with concerns about patient's insurance coverage will be ending August 2023.  CSW spoke with patient and discussed the criteria for Medicaid approval, and with the current disability amount the patient is receiving she will not meet Medicaid criteria.  CSW updated patient about [employer] and financial assistance from outside organizations.  Patient stated she wasn't certain how much it would help her if there was a cap on assistance but agreed to [employer] referral. CSW verified patient spoke with financial navigator who in December who updated patient she did not meet criteria for Medicaid.  Patient inquired about meeting criteria for Medicare due to terminal diagnosis.  CSW stated I would research criteria and suggested the patient contact social security disability case manager and inquire as well.  CSW recommended patient go to [employer] for possible insurance coverage.  Patient verbalized understanding and stated she would look.  CSW informed patient about counseling available to patient and patient stated she would think about it and contact CSW if she wanted to schedule a counseling appointment. Patient was driving when speaking with CSW, CSW will email patient contact information.    Adelene Amas, LCSW

## 2021-07-23 NOTE — Consult Note (Signed)
NEW PATIENT EVALUATION  Name: Felicia Manning  MRN: [id number]  Date:   07/23/2021     DOB: 1955/02/21   This 66 y.o. female patient presents to the clinic for initial evaluation of adenocarcinoma of the lung and patient treated with immunotherapy Alimta and Keytruda..  REFERRING PHYSICIAN: Dion Body, MD  CHIEF COMPLAINT: No chief complaint on file.   DIAGNOSIS: The encounter diagnosis was Cancer of lower lobe of left lung ([place]).   PREVIOUS INVESTIGATIONS:  CT scans PET CT scans reviewed Pathology reports reviewed Clinical notes reviewed  HPI: Patient is a 66 year old female originally presented with hypermetabolic polyp pulmonary nodule of the left lower lobe favoring primary lung cancer.  She also had numerous hypermetabolic nodules of the left lower pleura compatible with pleural metastatic disease as well as subcarinal and left hilar nodes.  Biopsy was positive for non-small cell lung cancer favoring adenocarcinoma.  She has been on Alimta Keytruda with excellent response in her chest.Most recent CT scan shows interval increase in a 2.5 spiculated left lower lobe nodule with an adjacent 5 mm nodule.  She no significant interval change in numerous scattered amorphous bilateral upper lobe groundglass opacities thought to reflect infectious etiology over malignancy.  No evidence of metastatic disease within the abdomen or pelvis.  Based on the excellent response I been asked to evaluate the left lower lobe nodule for consideration of SBRT.  He has a PET CT scan scheduled for today clinically she is doing well she still has some left rib pain.  No significant cough hemoptysis or chest tightness.  PLANNED TREATMENT REGIMEN: SBRT to her left lower lobe  PAST MEDICAL HISTORY:  has a past medical history of Barrett's esophagus, Dysrhythmia, GERD (gastroesophageal reflux disease), Hepatitis C (2008), Hyperlipidemia, Hypertension, Hypothyroidism, Nephrolithiasis, Non-small cell carcinoma  of left lung ([place]), Osteoporosis, Pulmonary mass, Thyroid cancer ([place]) (2012), Thyroid cancer ([place]), and Tumor.    PAST SURGICAL HISTORY:  Past Surgical History:  Procedure Laterality Date   ABDOMINAL HYSTERECTOMY  2006   COLONOSCOPY WITH PROPOFOL N/A 04/25/2015   Procedure: COLONOSCOPY WITH PROPOFOL;  Surgeon: Josefine Class, MD;  Location: [facility] ENDOSCOPY;  Service: Endoscopy;  Laterality: N/A;   CYSTOSCOPY W/ RETROGRADES Bilateral 05/19/2015   Procedure: CYSTOSCOPY WITH RETROGRADE PYELOGRAM;  Surgeon: Hollice Espy, MD;  Location: [facility] ORS;  Service: Urology;  Laterality: Bilateral;   CYSTOSCOPY W/ URETERAL STENT PLACEMENT Right 05/26/2015   Procedure: CYSTOSCOPY WITH STENT REPLACEMENT;  Surgeon: Hollice Espy, MD;  Location: [facility] ORS;  Service: Urology;  Laterality: Right;   CYSTOSCOPY WITH STENT PLACEMENT Right 05/19/2015   Procedure: CYSTOSCOPY WITH STENT PLACEMENT;  Surgeon: Hollice Espy, MD;  Location: [facility] ORS;  Service: Urology;  Laterality: Right;   CYSTOSCOPY WITH STENT PLACEMENT Right 05/26/2015   Procedure: CYSTOSCOPY WITH STENT PLACEMENT;  Surgeon: Hollice Espy, MD;  Location: [facility] ORS;  Service: Urology;  Laterality: Right;   CYSTOSCOPY WITH STENT PLACEMENT Right 06/25/2016   Procedure: CYSTOSCOPY WITH STENT PLACEMENT;  Surgeon: Nickie Retort, MD;  Location: [facility] ORS;  Service: Urology;  Laterality: Right;   CYSTOSCOPY WITH STENT PLACEMENT Left 10/31/2017   Procedure: CYSTOSCOPY WITH STENT PLACEMENT;  Surgeon: Hollice Espy, MD;  Location: [facility] ORS;  Service: Urology;  Laterality: Left;   CYSTOSCOPY/URETEROSCOPY/HOLMIUM LASER/STENT PLACEMENT Left 07/23/2015   Procedure: CYSTOSCOPY/URETEROSCOPY/HOLMIUM LASER/STENT PLACEMENT/RETROGRADE PYELOGRAM;  Surgeon: Hollice Espy, MD;  Location: [facility] ORS;  Service: Urology;  Laterality: Left;   CYSTOSCOPY/URETEROSCOPY/HOLMIUM LASER/STENT PLACEMENT Left 11/16/2017   Procedure: CYSTOSCOPY/URETEROSCOPY/HOLMIUM LASER/STENT Exchange;   Surgeon: Erlene Quan,  Caryl Pina, MD;  Location: [facility] ORS;  Service: Urology;  Laterality: Left;   ESOPHAGOGASTRODUODENOSCOPY (EGD) WITH PROPOFOL N/A 04/25/2015   Procedure: ESOPHAGOGASTRODUODENOSCOPY (EGD) WITH PROPOFOL;  Surgeon: Josefine Class, MD;  Location: [facility] ENDOSCOPY;  Service: Endoscopy;  Laterality: N/A;   EYE SURGERY Bilateral 1964   lazy eye repair   EYE SURGERY Bilateral 2001   lazy eye repair   IR IMAGING GUIDED PORT INSERTION  12/08/2020   LITHOTRIPSY  2009   THYROIDECTOMY  2010   URETEROSCOPY WITH HOLMIUM LASER LITHOTRIPSY Right 05/19/2015   Procedure: URETEROSCOPY WITH HOLMIUM LASER LITHOTRIPSY;  Surgeon: Hollice Espy, MD;  Location: [facility] ORS;  Service: Urology;  Laterality: Right;   URETEROSCOPY WITH HOLMIUM LASER LITHOTRIPSY Right 05/26/2015   Procedure: URETEROSCOPY WITH HOLMIUM LASER LITHOTRIPSY;  Surgeon: Hollice Espy, MD;  Location: [facility] ORS;  Service: Urology;  Laterality: Right;   URETEROSCOPY WITH HOLMIUM LASER LITHOTRIPSY Right 06/25/2016   Procedure: URETEROSCOPY WITH HOLMIUM LASER LITHOTRIPSY;  Surgeon: Nickie Retort, MD;  Location: [facility] ORS;  Service: Urology;  Laterality: Right;    FAMILY HISTORY: family history includes CAD in her mother; Dementia in her father; Heart disease in her mother.  SOCIAL HISTORY:  reports that she has been smoking cigarettes. She has a 40.00 pack-year smoking history. She has never used smokeless tobacco. She reports that she does not currently use alcohol. She reports that she does not use drugs.  ALLERGIES: Patient has no known allergies.  MEDICATIONS:  Current Outpatient Medications  Medication Sig Dispense Refill   Accu-Chek Softclix Lancets lancets Check glucose once a day 100 each 12   albuterol (VENTOLIN HFA) 108 (90 Base) MCG/ACT inhaler Inhale 2 puffs into the lungs every 6 (six) hours as needed for wheezing or shortness of breath. 8 g 2   amLODipine (NORVASC) 5 MG tablet Take 5 mg by mouth every morning.       blood glucose meter kit and supplies KIT Dispense based on patient and insurance preference. Use up to three times daily as directed. 1 each 0   cholecalciferol (VITAMIN D) 1000 units tablet Take 2,000 Units by mouth daily.     Cranberry-Vitamin C-Probiotic (AZO CRANBERRY PO) Take 1 tablet by mouth daily.     fluticasone-salmeterol (ADVAIR HFA) 45-21 MCG/ACT inhaler Inhale 2 puffs into the lungs 2 (two) times daily. 1 each 12   folic acid (FOLVITE) 1 MG tablet Take 1 tablet (1 mg total) by mouth daily. 90 tablet 1   glipiZIDE (GLUCOTROL) 5 MG tablet Take 1 tablet (5 mg total) by mouth 2 (two) times daily before a meal. 180 tablet 1   HYDROcodone-acetaminophen (NORCO/VICODIN) 5-325 MG tablet Take 1 tablet by mouth every 8 (eight) hours as needed for moderate pain. 60 tablet 0   levothyroxine (SYNTHROID) 175 MCG tablet Sat and Sun ON AN EMPTY STOMACH WITH A GLASS OF WATER AT LEAST 30-60 MINUTES BEFORE BREAKFAST     levothyroxine (SYNTHROID, LEVOTHROID) 150 MCG tablet Take 150 mcg by mouth at bedtime.      lidocaine-prilocaine (EMLA) cream Apply 1 application topically as needed. 30 g 0   loperamide (IMODIUM) 2 MG capsule Take by mouth as needed for diarrhea or loose stools.     losartan (COZAAR) 100 MG tablet Take 100 mg by mouth every morning.      metoprolol succinate (TOPROL-XL) 25 MG 24 hr tablet Take 25 mg by mouth every morning.   1   montelukast (SINGULAIR) 10 MG tablet Take 1 tablet (10 mg total)  by mouth at bedtime. 30 tablet 1   nystatin (MYCOSTATIN) 100000 UNIT/ML suspension Swish and swallow. Don't drink fluids immediately following administration. (Patient not taking: Reported on 06/24/2021) 400 mL 0   omeprazole (PRILOSEC) 20 MG capsule Take 20 mg by mouth daily before breakfast.      ondansetron (ZOFRAN) 8 MG tablet Take 1 tablet (8 mg total) by mouth every 8 (eight) hours as needed for nausea or vomiting. Start on the third day after chemotherapy 30 tablet 2   prochlorperazine  (COMPAZINE) 10 MG tablet Take 1 tablet (10 mg total) by mouth every 6 (six) hours as needed for nausea or vomiting. 30 tablet 2   No current facility-administered medications for this encounter.    ECOG PERFORMANCE STATUS:  1 - Symptomatic but completely ambulatory  REVIEW OF SYSTEMS: Patient denies any weight loss, fatigue, weakness, fever, chills or night sweats. Patient denies any loss of vision, blurred vision. Patient denies any ringing  of the ears or hearing loss. No irregular heartbeat. Patient denies heart murmur or history of fainting. Patient denies any chest pain or pain radiating to her upper extremities. Patient denies any shortness of breath, difficulty breathing at night, cough or hemoptysis. Patient denies any swelling in the lower legs. Patient denies any nausea vomiting, vomiting of blood, or coffee ground material in the vomitus. Patient denies any stomach pain. Patient states has had normal bowel movements no significant constipation or diarrhea. Patient denies any dysuria, hematuria or significant nocturia. Patient denies any problems walking, swelling in the joints or loss of balance. Patient denies any skin changes, loss of hair or loss of weight. Patient denies any excessive worrying or anxiety or significant depression. Patient denies any problems with insomnia. Patient denies excessive thirst, polyuria, polydipsia. Patient denies any swollen glands, patient denies easy bruising or easy bleeding. Patient denies any recent infections, allergies or URI. Patient "s visual fields have not changed significantly in recent time.   PHYSICAL EXAM: BP 118/69   Pulse 75   Temp 98.4 F (36.9 C)   Resp 18   Ht _0  (1.676 m)   Wt 187 lb (84.8 kg)   BMI 30.18 kg/m  Well-developed well-nourished patient in NAD. HEENT reveals PERLA, EOMI, discs not visualized.  Oral cavity is clear. No oral mucosal lesions are identified. Neck is clear without evidence of cervical or supraclavicular  adenopathy. Lungs are clear to A&P. Cardiac examination is essentially unremarkable with regular rate and rhythm without murmur rub or thrill. Abdomen is benign with no organomegaly or masses noted. Motor sensory and DTR levels are equal and symmetric in the upper and lower extremities. Cranial nerves II through XII are grossly intact. Proprioception is intact. No peripheral adenopathy or edema is identified. No motor or sensory levels are noted. Crude visual fields are within normal range.  LABORATORY DATA: Pathology reports reviewed    RADIOLOGY RESULTS: Serial CT scans reviewed PET CT scan from today will be reviewed when available   IMPRESSION: Excellent response for stage IV adenocarcinoma the lung with only area of progression is a left lower lobe lung nodule in patient on Alimta Keytruda in 66 year old female  PLAN: At this time of offered to go ahead with SBRT to her left lower lobe should her PET scan showed no other evidence of disease at this time believe it is prudent to go ahead based on the progressive nature of this lesion.  I will plan on delivering 60 Gray in 5 fractions using for dimensional treatment  planning as well as motion restriction.  Risks and benefits of treatment including the low side effect profile possibly developing a cough and some fatigue all were discussed in detail with the patient.  I have personally set up and ordered CT simulation.  Certainly we will be reviewing her PET scan prior to initiating treatment.  I would like to take this opportunity to thank you for allowing me to participate in the care of your patient.Noreene Filbert, MD

## 2021-07-24 ENCOUNTER — Telehealth: Payer: Self-pay | Admitting: Nurse Practitioner

## 2021-07-24 LAB — GLUCOSE, CAPILLARY: Glucose-Capillary: 58 mg/dL — ABNORMAL LOW (ref 70–99)

## 2021-07-24 NOTE — Telephone Encounter (Signed)
Rec'd call from patient and we discussed the Palliative referral/services and all questions were answered and patient was in agreement with scheduling visit.  I have scheduled a MyChart Consult for 07/30/21 @ 2:30 PM

## 2021-07-28 ENCOUNTER — Other Ambulatory Visit: Payer: 59

## 2021-07-28 ENCOUNTER — Ambulatory Visit
Admission: RE | Admit: 2021-07-28 | Discharge: 2021-07-28 | Disposition: A | Discharge status: Home / Self Care | Payer: 59 | Source: Ambulatory Visit | Attending: Family Medicine | Admitting: Family Medicine

## 2021-07-28 DIAGNOSIS — Z1231 Encounter for screening mammogram for malignant neoplasm of breast: Secondary | ICD-10-CM | POA: Insufficient documentation

## 2021-07-30 ENCOUNTER — Telehealth: Payer: 59 | Admitting: Nurse Practitioner

## 2021-07-30 ENCOUNTER — Encounter: Payer: Self-pay | Admitting: Internal Medicine

## 2021-07-30 ENCOUNTER — Encounter: Payer: Self-pay | Admitting: Nurse Practitioner

## 2021-07-30 DIAGNOSIS — G4701 Insomnia due to medical condition: Secondary | ICD-10-CM

## 2021-07-30 DIAGNOSIS — R053 Chronic cough: Secondary | ICD-10-CM

## 2021-07-30 DIAGNOSIS — Z515 Encounter for palliative care: Secondary | ICD-10-CM

## 2021-07-30 NOTE — Progress Notes (Signed)
[facility] Consult Note Telephone: [phone]  Fax: [phone]   Date of encounter: 07/30/21 4:54 PM PATIENT NAME: Felicia [address] [id number]   [phone] (home)  DOB: [birth year] MRN: [id number] PRIMARY CARE PROVIDER:    Dion Body, MD,  [facility] [place] [address] [phone]  REFERRING PROVIDER:   Dion Body, MD [facility] [place],  [place] [address] [phone]  RESPONSIBLE PARTY:    Contact Information     Name Relation Home Work Mobile   Hern,Amber Daughter   [phone]      Due to the COVID-19 crisis, this visit was done via telemedicine from my office and it was initiated and consent by this patient and or family.  I connected with  Metzli Demas OR PROXY on 07/30/21 by a video enabled telemedicine application and verified that I am speaking with the correct person using two identifiers.   I discussed the limitations of evaluation and management by telemedicine. The patient expressed understanding and agreed to proceed. Palliative Care was asked to follow this patient by consultation request of  Dion Body, MD to address advance care planning and complex medical decision making. This is the initial visit.                                ASSESSMENT AND PLAN / RECOMMENDATIONS:  Symptom Management/Plan: 1. Advance Care Planning; DNR; MOST form in vynca with wishes for limited treatment options, short term hospitalization, wishes are for antibiotics, IVF, no feeding tube; will need further LTC planning with family for goc with disease progression.   2. Goals of Care: Goals include to maximize quality of life and symptom management. Our advance care planning conversation included a discussion about:    The value and importance of advance care planning  Exploration of personal, cultural or spiritual  beliefs that might influence medical decisions  Exploration of goals of care in the event of a sudden injury or illness  Identification and preparation of a healthcare agent  Review and updating or creation of an advance directive document.  3. Chronic intermit cough, discussed at length with tessalon pearls helping, no refill needed  4. Insomnia, discussed sleep patterns, sleep hygiene, continue current regimen as with coughing controlled, makes sleeping much easier as she is not awoke by coughing episodes.   5. Palliative care encounter; Palliative care encounter; Palliative medicine team will continue to support patient, patient's family, and medical team. Visit consisted of counseling and education dealing with the complex and emotionally intense issues of symptom management and palliative care in the setting of serious and potentially life-threatening illness  Follow up Palliative Care Visit: Palliative care will continue to follow for complex medical decision making, advance care planning, and clarification of goals. Return 8 weeks or prn.  I spent 62 minutes providing this consultation. More than 50% of the time in this consultation was spent in counseling and care coordination. PPS: 60% Chief Complaint: Follow up palliative consult for complex medical decision making, address goals, manage ongoing symptoms  HISTORY OF PRESENT ILLNESS:  Felicia Manning is a 66 y.o. 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 I connected with Ms. Vales by video for initial PC visit. We talked about purpose of pc visit, past medical history,  dx of cancer and how it was found with ED visit. We talked about social/family history. We talked about symptoms including intermit cough, insomnia, including frequency, severity. We talked about appetite, weight. We talked about functional abilities. We talked about medical goals, quality of life. We talked about adl's including shower chair in the shower, fall precautions. We talked about f/u appointment at [facility] for tumor behind right eye and cataract which is what is impeding quality of life at this time, difficulty seeing out of her right eye. Ms. Cui endorses she is no longer able to drive at night, local during the day. Ms. Openshaw endorses she will be talking to someone tomorrow about helping with housekeeping, she is able to make her own meals. We talked about her granddaughter has been living with her 3 yrs old, but recently moved back to [place] to live with her mom. Now Ms. Talamantez resides independently. We talked about chemotherapy with no noted side effects, schedule and further planning with radiation coming up. We talked about expectations. We talked about pain managed. We talked about f/u pc visit following [facility]. Appointment then Ms. Soohoo has family visiting so wished to push it to August but understands she can call if needs arise. Ms. Mello also follows J.Borders NP PC at [facility] in collaboration. F/u PC visit scheduled. Therapeutic listening, emotional support provided. Questions answered.   Oncology History Overview Note   IMPRESSION: Hypermetabolic solid pulmonary nodule of the left lower lobe, favor primary lung malignancy.   Numerous hypermetabolic nodules of the lower left pleura, compatible with pleural metastatic disease.   Hypermetabolic subcarinal and left hilar lymph nodes, compatible with metastatic disease.   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 Recommend attention on follow-up.   Mild asymmetric FDG uptake below mediastinal blood pool within a small nodule of the left parotid gland, favored to be benign. Recommend attention on follow-up.   No evidence of metastatic disease in the abdomen or pelvis.   No evidence of osseous metastatic disease.   ---------------------   # MARCH 2022- incidental LLL [ 0.9 cm] s/p COVID vaccine; [Dr.Aleskerov]; July 2022- left pleural pain-    #  #October 2022 MRI brain-punctate lesion-asymptomatic [D/w- Dr.Vaslow]; December 2022 MRI negative for any acute process; chronic sclerosis of hippocampus-clinically insignificant.   COPD-non-complaint    ------------------    DIAGNOSIS:  A. PLEURAL MASS, LEFT CHEST; BIOPSY:  - DIAGNOSTIC OF MALIGNANCY.  - NON-SMALL CELL CARCINOMA, FAVOR ADENOCARCINOMA.   Comment:  The tumor cells are positive for CK7 and TTF1 (patchy).  They are  negative for p40.    # 12/10/2020-carbo Alimta; cycle #1 [Keytruda pending insurance]   #NGS PD-L1 TPS 1%    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[place])   05/02/2015 Initial Diagnosis     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[place])     Cancer of lower lobe of left lung ([place])   12/02/2020 Initial Diagnosis     Cancer of lower lobe of left lung ([place])     12/10/2020 Cancer Staging     Staging form: Lung, AJCC 8th Edition - Clinical: Stage IVA (cT4, cN3, cM1a) -  Signed by Cammie Sickle, MD on 12/10/2020       12/10/2020 -  Chemotherapy     Patient is on Treatment Plan : LUNG CARBOplatin / Pemetrexed / Pembrolizumab q21d Induction x 4 cycles / Maintenance Pemetrexed + Pembrolizumab         History obtained from review of EMR, discussion with Ms. Fluegge.  I reviewed available labs, medications, imaging, studies and related documents from the EMR.  Records reviewed and summarized above.   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   Fungal infection of toenail 06/11/2015   Osteoporosis, post-menopausal 06/11/2015   Barrett esophagus 05/02/2015   Acid reflux 05/02/2015   HCV (hepatitis C virus) 05/02/2015   HLD (hyperlipidemia) 05/02/2015   BP (high blood pressure) 05/02/2015   Adult hypothyroidism 05/02/2015   Elevated WBC count 05/02/2015   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[place]) 05/02/2015   Kidney stone    Pyelonephritis    UTI (lower urinary tract infection) 04/08/2015   Flank pain 04/08/2015   Chronic hepatitis C virus infection ([place]) 03/12/2015   Essential (primary) hypertension 03/12/2015   Gastro-esophageal reflux disease without esophagitis 03/12/2015   Hypothyroidism, postop 03/12/2015   H/O malignant neoplasm of thyroid 01/29/2014   OP (osteoporosis) 10/06/2010   Current tobacco use 10/06/2010   Tobacco use 10/06/2010   PAST MEDICAL HISTORY:  Active Ambulatory Problems    Diagnosis Date Noted   UTI (lower urinary tract infection) 04/08/2015   Flank pain 04/08/2015   Kidney stone    Pyelonephritis    Barrett esophagus 05/02/2015   Chronic hepatitis C virus infection ([place]) 03/12/2015   Essential (primary) hypertension 03/12/2015   Gastro-esophageal reflux disease without esophagitis 03/12/2015   Acid reflux 05/02/2015   HCV (hepatitis C virus) 05/02/2015   H/O malignant neoplasm of thyroid 01/29/2014   HLD (hyperlipidemia) 05/02/2015   BP (high blood pressure) 05/02/2015   Adult hypothyroidism 05/02/2015   Elevated WBC count 05/02/2015   OP (osteoporosis) 10/06/2010   Hypothyroidism, postop 03/12/2015   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[place])  05/02/2015   Current tobacco use 10/06/2010   Fungal infection of toenail 06/11/2015   Osteoporosis, post-menopausal 06/11/2015   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   Tobacco use 10/06/2010   Acute diverticulitis 06/18/2017   Sepsis ([place]) 08/13/2017   Cancer of lower lobe of left lung ([place]) 12/02/2020   Brain metastasis 01/21/2021   Resolved Ambulatory Problems    Diagnosis Date Noted   No Resolved Ambulatory Problems   Past Medical History:  Diagnosis Date   Barrett's esophagus    Dysrhythmia    GERD (gastroesophageal reflux disease)    Hepatitis C 2008   Hyperlipidemia    Hypertension    Hypothyroidism    Nephrolithiasis    Non-small cell carcinoma of left lung (HCC)    Osteoporosis    Pulmonary mass    Thyroid cancer ([place]) 2012   Thyroid cancer ([place])    Tumor    SOCIAL HX:  Social History   Tobacco Use   Smoking status: Every Day    Packs/day: 1.00    Years: 40.00    Total pack years: 40.00    Types: Cigarettes   Smokeless  tobacco: Never  Substance Use Topics   Alcohol use: Not Currently    Comment: socially   FAMILY HX:  Family History  Problem Relation Age of Onset   CAD Mother    Heart disease Mother    Dementia Father    Bladder Cancer Neg Hx    Prostate cancer Neg Hx    Kidney cancer Neg Hx    Breast cancer Neg Hx       ALLERGIES: No Known Allergies   PERTINENT MEDICATIONS:  Outpatient Encounter Medications as of 07/30/2021  Medication Sig   Accu-Chek Softclix Lancets lancets Check glucose once a day   albuterol (VENTOLIN HFA) 108 (90 Base) MCG/ACT inhaler Inhale 2 puffs into the lungs every 6 (six) hours as needed for wheezing or shortness of breath.   amLODipine (NORVASC) 5 MG tablet Take 5 mg by mouth every morning.    blood glucose meter kit and supplies KIT Dispense based on patient and insurance preference. Use up to three times daily as directed.   cholecalciferol (VITAMIN D) 1000 units tablet Take 2,000 Units by mouth daily.    Cranberry-Vitamin C-Probiotic (AZO CRANBERRY PO) Take 1 tablet by mouth daily.   fluticasone-salmeterol (ADVAIR HFA) 45-21 MCG/ACT inhaler Inhale 2 puffs into the lungs 2 (two) times daily.   folic acid (FOLVITE) 1 MG tablet Take 1 tablet (1 mg total) by mouth daily.   glipiZIDE (GLUCOTROL) 5 MG tablet Take 1 tablet (5 mg total) by mouth 2 (two) times daily before a meal.   HYDROcodone-acetaminophen (NORCO/VICODIN) 5-325 MG tablet Take 1 tablet by mouth every 8 (eight) hours as needed for moderate pain.   levothyroxine (SYNTHROID) 175 MCG tablet Sat and Sun ON AN EMPTY STOMACH WITH A GLASS OF WATER AT LEAST 30-60 MINUTES BEFORE BREAKFAST   levothyroxine (SYNTHROID, LEVOTHROID) 150 MCG tablet Take 150 mcg by mouth at bedtime.    lidocaine-prilocaine (EMLA) cream Apply 1 application topically as needed.   loperamide (IMODIUM) 2 MG capsule Take by mouth as needed for diarrhea or loose stools.   losartan (COZAAR) 100 MG tablet Take 100 mg by mouth every morning.    metoprolol succinate (TOPROL-XL) 25 MG 24 hr tablet Take 25 mg by mouth every morning.    montelukast (SINGULAIR) 10 MG tablet Take 1 tablet (10 mg total) by mouth at bedtime.   nystatin (MYCOSTATIN) 100000 UNIT/ML suspension Swish and swallow. Don't drink fluids immediately following administration. (Patient not taking: Reported on 06/24/2021)   omeprazole (PRILOSEC) 20 MG capsule Take 20 mg by mouth daily before breakfast.    ondansetron (ZOFRAN) 8 MG tablet Take 1 tablet (8 mg total) by mouth every 8 (eight) hours as needed for nausea or vomiting. Start on the third day after chemotherapy   prochlorperazine (COMPAZINE) 10 MG tablet Take 1 tablet (10 mg total) by mouth every 6 (six) hours as needed for nausea or vomiting.   No facility-administered encounter medications on file as of 07/30/2021.   Thank you for the opportunity to participate in the care of Ms. Martino.  The palliative care team will continue to follow. Please call our  office at [phone] if we can be of additional assistance.   Analicia Skibinski Z Desean Heemstra, NP ,

## 2021-08-03 ENCOUNTER — Encounter: Payer: Self-pay | Admitting: Internal Medicine

## 2021-08-04 ENCOUNTER — Ambulatory Visit: Payer: 59

## 2021-08-04 MED FILL — Dexamethasone Sodium Phosphate Inj 100 MG/10ML: INTRAMUSCULAR | Qty: 1 | Status: AC

## 2021-08-05 ENCOUNTER — Ambulatory Visit
Admission: RE | Admit: 2021-08-05 | Discharge: 2021-08-05 | Disposition: A | Discharge status: Home / Self Care | Payer: 59 | Source: Ambulatory Visit | Attending: Radiation Oncology | Admitting: Radiation Oncology

## 2021-08-05 ENCOUNTER — Encounter: Payer: Self-pay | Admitting: Internal Medicine

## 2021-08-05 ENCOUNTER — Inpatient Hospital Stay: Payer: 59

## 2021-08-05 ENCOUNTER — Inpatient Hospital Stay (HOSPITAL_BASED_OUTPATIENT_CLINIC_OR_DEPARTMENT_OTHER): Payer: 59 | Admitting: Internal Medicine

## 2021-08-05 DIAGNOSIS — J449 Chronic obstructive pulmonary disease, unspecified: Secondary | ICD-10-CM | POA: Diagnosis not present

## 2021-08-05 DIAGNOSIS — C3432 Malignant neoplasm of lower lobe, left bronchus or lung: Secondary | ICD-10-CM

## 2021-08-05 DIAGNOSIS — Z5111 Encounter for antineoplastic chemotherapy: Secondary | ICD-10-CM | POA: Insufficient documentation

## 2021-08-05 DIAGNOSIS — F17210 Nicotine dependence, cigarettes, uncomplicated: Secondary | ICD-10-CM | POA: Diagnosis not present

## 2021-08-05 DIAGNOSIS — E119 Type 2 diabetes mellitus without complications: Secondary | ICD-10-CM | POA: Insufficient documentation

## 2021-08-05 DIAGNOSIS — Z85850 Personal history of malignant neoplasm of thyroid: Secondary | ICD-10-CM | POA: Insufficient documentation

## 2021-08-05 DIAGNOSIS — C778 Secondary and unspecified malignant neoplasm of lymph nodes of multiple regions: Secondary | ICD-10-CM | POA: Diagnosis not present

## 2021-08-05 DIAGNOSIS — Z79899 Other long term (current) drug therapy: Secondary | ICD-10-CM | POA: Insufficient documentation

## 2021-08-05 LAB — CBC WITH DIFFERENTIAL/PLATELET
Abs Immature Granulocytes: 0.03 10*3/uL (ref 0.00–0.07)
Basophils Absolute: 0.1 10*3/uL (ref 0.0–0.1)
Basophils Relative: 1 %
Eosinophils Absolute: 0.3 10*3/uL (ref 0.0–0.5)
Eosinophils Relative: 3 %
HCT: 37.3 % (ref 36.0–46.0)
Hemoglobin: 11.8 g/dL — ABNORMAL LOW (ref 12.0–15.0)
Immature Granulocytes: 0 %
Lymphocytes Relative: 23 %
Lymphs Abs: 1.7 10*3/uL (ref 0.7–4.0)
MCH: 31.9 pg (ref 26.0–34.0)
MCHC: 31.6 g/dL (ref 30.0–36.0)
MCV: 100.8 fL — ABNORMAL HIGH (ref 80.0–100.0)
Monocytes Absolute: 1 10*3/uL (ref 0.1–1.0)
Monocytes Relative: 13 %
Neutro Abs: 4.6 10*3/uL (ref 1.7–7.7)
Neutrophils Relative %: 60 %
Platelets: 294 10*3/uL (ref 150–400)
RBC: 3.7 MIL/uL — ABNORMAL LOW (ref 3.87–5.11)
RDW: 15.9 % — ABNORMAL HIGH (ref 11.5–15.5)
WBC: 7.6 10*3/uL (ref 4.0–10.5)
nRBC: 0 % (ref 0.0–0.2)

## 2021-08-05 LAB — COMPREHENSIVE METABOLIC PANEL
ALT: 25 U/L (ref 0–44)
AST: 26 U/L (ref 15–41)
Albumin: 3.5 g/dL (ref 3.5–5.0)
Alkaline Phosphatase: 71 U/L (ref 38–126)
Anion gap: 7 (ref 5–15)
BUN: 14 mg/dL (ref 8–23)
CO2: 26 mmol/L (ref 22–32)
Calcium: 8.5 mg/dL — ABNORMAL LOW (ref 8.9–10.3)
Chloride: 105 mmol/L (ref 98–111)
Creatinine, Ser: 0.82 mg/dL (ref 0.44–1.00)
GFR, Estimated: >60 mL/min (ref >60)
Glucose, Bld: 136 mg/dL — ABNORMAL HIGH (ref 70–99)
Potassium: 3.6 mmol/L (ref 3.5–5.1)
Sodium: 138 mmol/L (ref 135–145)
Total Bilirubin: 0.5 mg/dL (ref 0.3–1.2)
Total Protein: 7 g/dL (ref 6.5–8.1)

## 2021-08-05 LAB — VITAMIN D 25 HYDROXY (VIT D DEFICIENCY, FRACTURES): Vit D, 25-Hydroxy: 48.33 ng/mL (ref 30–100)

## 2021-08-05 MED ORDER — SODIUM CHLORIDE 0.9 % IV SOLN
500.0000 mg/m2 | Freq: Once | INTRAVENOUS | Status: AC
  Administered 2021-08-05: 1000 mg via INTRAVENOUS
  Filled 2021-08-05: qty 40

## 2021-08-05 MED ORDER — HEPARIN SOD (PORK) LOCK FLUSH 100 UNIT/ML IV SOLN
500.0000 [IU] | Freq: Once | INTRAVENOUS | Status: AC | PRN
  Administered 2021-08-05: 500 [IU]
  Filled 2021-08-05: qty 5

## 2021-08-05 MED ORDER — SODIUM CHLORIDE 0.9% FLUSH
10.0000 mL | Freq: Once | INTRAVENOUS | Status: AC
  Administered 2021-08-05: 10 mL via INTRAVENOUS
  Filled 2021-08-05: qty 10

## 2021-08-05 MED ORDER — SODIUM CHLORIDE 0.9 % IV SOLN: 8.0000 mg | Freq: Once | INTRAVENOUS | Status: DC

## 2021-08-05 MED ORDER — CYANOCOBALAMIN 1000 MCG/ML IJ SOLN
1000.0000 ug | Freq: Once | INTRAMUSCULAR | Status: AC
  Administered 2021-08-05: 1000 ug via INTRAMUSCULAR
  Filled 2021-08-05: qty 1

## 2021-08-05 MED ORDER — SODIUM CHLORIDE 0.9 % IV SOLN
Freq: Once | INTRAVENOUS | Status: AC
  Filled 2021-08-05: qty 250

## 2021-08-05 MED ORDER — SODIUM CHLORIDE 0.9 % IV SOLN
10.0000 mg | Freq: Once | INTRAVENOUS | Status: AC
  Administered 2021-08-05: 10 mg via INTRAVENOUS
  Filled 2021-08-05: qty 10

## 2021-08-05 MED ORDER — SODIUM CHLORIDE 0.9% FLUSH
10.0000 mL | INTRAVENOUS | Status: DC | PRN
  Administered 2021-08-05: 10 mL
  Filled 2021-08-05: qty 10

## 2021-08-05 MED ORDER — ONDANSETRON HCL 4 MG/2ML IJ SOLN
8.0000 mg | Freq: Once | INTRAMUSCULAR | Status: AC
  Administered 2021-08-05: 8 mg via INTRAVENOUS
  Filled 2021-08-05: qty 4

## 2021-08-05 MED ORDER — PROCHLORPERAZINE MALEATE 10 MG PO TABS
10.0000 mg | ORAL_TABLET | Freq: Once | ORAL | Status: AC
  Administered 2021-08-05: 10 mg via ORAL
  Filled 2021-08-05: qty 1

## 2021-08-05 NOTE — Assessment & Plan Note (Addendum)
#  Stage IV -non-small cell favor adenocarcinoma; s/p carbo-alimta-keytruda. CUrrently on Alimta-keytruda maintrence MAY 30th, 2023- Interval increase in size of the 2.5 cm spiculated left lower lobe pulmonary nodule with a new solid 5 mm nodule adjacent to the spiculated mass as well as increased size of the subcarinal and hilar lymph nodes, findings which are concerning for disease progression.JUNE 8th, 2023-  Enlarging left lower lobe pulmonary nodule seen on recent CT scan is markedly hypermetabolic, consistent with neoplasm; Hypermetabolic nodal metastatic disease in the left hilum and central mediastinum.   #Proceed with  maintaintnance  Alimta ONLY today Labs today reviewed;mild anemia- Hb 11;   acceptable for treatment today.  Hold Keytruda for the next 2 cycles given the upcoming radiation.  Discussed with Dr. Donella Stade plans radiation 20 fractions to the hilar/mediastinal lymph nodes/and the left lower lobe nodule.  #Left parotid incidental uptake - PET June 8th, 2023-stable tiny left parotid nodule with increased hypermetabolism in the interval. Metastatic disease not excluded although this may represent a tiny parotid neoplasm. HOLD off ENT referral- discussed with pt..   Monitor for now  # COPD [Dr.A]/allergies likely secondary to poorly controlled COPD.  Recommend using albuterol 3-4 times a day and also compliance with Advair.  Recommend Singulair.  Ambulatory heart rate/pulse ox-80s; greater than 95.  No concern for immunotherapy induced pneumonitis.  # Left chest wall pain:-improved/resolved-using hydrocodone qhs-STABLE;   # Right eye vision changes-3.6 mm lesion noted in the right eye;s/p ophthalmology evaluation at [facility]. 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 STABLE;   # Diabetes-A1c 6.9 [AUG 2022]-Fatsing-95.  Continue glipizide 5 mg BID STABLE;   # Diarrhea-G-1-on immoiudm/ Oral  mucositis- G-1- STABLE;    # Nausea- G-1 -Zofran Dex Compazine Zofran- [if worse would consider Aloxi] STABLE;   # Hypocalcemia: on Vit D BID [8.5].  Awaiting- VitD levels.  STABLE;    # Mediport placement-s/p dye study c tPA-functioning. STABLE;   * B12 - on 6/21;  HOLD -keytruda 6/21& 7/12  #DISPOSITION: # Alimta ONLY; HOLD-keytruda today- # 3 week- MD- labs- cbc/cmp; ONLY Alimta Dr.B  # I reviewed the blood work- with the patient in detail; also reviewed the imaging independently [as summarized above]; and with the patient in detail.

## 2021-08-05 NOTE — Patient Instructions (Signed)
[facility]  Discharge Instructions: Thank you for choosing [facility] to provide your oncology and hematology care.  If you have a lab appointment with the Cancer Center, please go directly to the Cancer Center and check in at the registration area.  Wear comfortable clothing and clothing appropriate for easy access to any Portacath or PICC line.   We strive to give you quality time with your provider. You may need to reschedule your appointment if you arrive late (15 or more minutes).  Arriving late affects you and other patients whose appointments are after yours.  Also, if you miss three or more appointments without notifying the office, you may be dismissed from the clinic at the provider's discretion.      For prescription refill requests, have your pharmacy contact our office and allow 72 hours for refills to be completed.    Today you received the following chemotherapy and/or immunotherapy agents: Alimta      To help prevent nausea and vomiting after your treatment, we encourage you to take your nausea medication as directed.  BELOW ARE SYMPTOMS THAT SHOULD BE REPORTED IMMEDIATELY: *FEVER GREATER THAN 100.4 F (38 C) OR HIGHER *CHILLS OR SWEATING *NAUSEA AND VOMITING THAT IS NOT CONTROLLED WITH YOUR NAUSEA MEDICATION *UNUSUAL SHORTNESS OF BREATH *UNUSUAL BRUISING OR BLEEDING *URINARY PROBLEMS (pain or burning when urinating, or frequent urination) *BOWEL PROBLEMS (unusual diarrhea, constipation, pain near the anus) TENDERNESS IN MOUTH AND THROAT WITH OR WITHOUT PRESENCE OF ULCERS (sore throat, sores in mouth, or a toothache) UNUSUAL RASH, SWELLING OR PAIN  UNUSUAL VAGINAL DISCHARGE OR ITCHING   Items with * indicate a potential emergency and should be followed up as soon as possible or go to the Emergency Department if any problems should occur.  Please show the CHEMOTHERAPY ALERT CARD or IMMUNOTHERAPY ALERT CARD at check-in to the  Emergency Department and triage nurse.  Should you have questions after your visit or need to cancel or reschedule your appointment, please contact [facility]  [phone] and follow the prompts.  Office hours are 8:00 a.m. to 4:30 p.m. Monday - Friday. Please note that voicemails left after 4:00 p.m. may not be returned until the following business day.  We are closed weekends and major holidays. You have access to a nurse at all times for urgent questions. Please call the main number to the clinic [phone] and follow the prompts.  For any non-urgent questions, you may also contact your provider using MyChart. We now offer e-Visits for anyone 18 and older to request care online for non-urgent symptoms. For details visit [email or web address].   Also download the MyChart app! Go to the app store, search "MyChart", open the app, select [facility], and log in with your MyChart username and password.  Masks are optional in the cancer centers. If you would like for your care team to wear a mask while they are taking care of you, please let them know. For doctor visits, patients may have with them one support person who is at least 64 years old. At this time, visitors are not allowed in the infusion area.   

## 2021-08-05 NOTE — Progress Notes (Signed)
[facility] CONSULT NOTE  Patient Care Team: Dion Body, MD as PCP - General (Family Medicine) Leata Mouse (Inactive) Byrnett, Forest Gleason, MD (General Surgery) Telford Nab, RN as Oncology Nurse Navigator Cammie Sickle, MD as Consulting Physician (Oncology)  CHIEF COMPLAINTS/PURPOSE OF CONSULTATION: lung cancer  Oncology History Overview Note  IMPRESSION: Hypermetabolic solid pulmonary nodule of the left lower lobe, favor primary lung malignancy.   Numerous hypermetabolic nodules of the lower left pleura, compatible with pleural metastatic disease.   Hypermetabolic subcarinal and left hilar lymph nodes, compatible with metastatic disease.   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 Recommend attention on follow-up.   Mild asymmetric FDG uptake below mediastinal blood pool within a small nodule of the left parotid gland, favored to be benign. Recommend attention on follow-up.   No evidence of metastatic disease in the abdomen or pelvis.   No evidence of osseous metastatic disease.   ---------------------  # MARCH 2022- incidental LLL [ 0.9 cm] s/p COVID vaccine; [Dr.Aleskerov]; July 2022- left pleural pain-   #  #October 2022 MRI brain-punctate lesion-asymptomatic [D/w- Dr.Vaslow]; December 2022 MRI negative for any acute process; chronic sclerosis of hippocampus-clinically insignificant.  COPD-non-complaint    ------------------   DIAGNOSIS:  A. PLEURAL MASS, LEFT CHEST; BIOPSY:  - DIAGNOSTIC OF MALIGNANCY.  - NON-SMALL CELL CARCINOMA, FAVOR ADENOCARCINOMA.   Comment:  The tumor cells are positive for CK7 and TTF1 (patchy).  They are  negative for p40.   # 12/10/2020-carbo Alimta; cycle #1.    # JUNE 8th, 2023-  Enlarging left  lower lobe pulmonary nodule seen on recent CT scan is markedly hypermetabolic, consistent with neoplasm; Hypermetabolic nodal metastatic disease in the left hilum and central mediastinum. JUNE- 2023- RT 20 Fractions.   #NGS PD-L1 TPS 1%   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[place])  05/02/2015 Initial Diagnosis   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[facility])   Cancer of lower lobe of left lung ([place])  12/02/2020 Initial Diagnosis   Cancer of lower lobe of left lung ([place])   12/10/2020 Cancer Staging   Staging form: Lung, AJCC 8th Edition - Clinical: Stage IVA (cT4, cN3, cM1a) - Signed by Cammie Sickle, MD on 12/10/2020   12/10/2020 -  Chemotherapy   Patient is on Treatment Plan : LUNG CARBOplatin / Pemetrexed / Pembrolizumab q21d Induction x 4 cycles / Maintenance Pemetrexed + Pembrolizumab        HISTORY OF PRESENTING ILLNESS: Ambulating independently.  Alone.  Felicia Manning 66 y.o.  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  Patient also status post evaluation with radiation oncology given increasing size of the left lower lobe pulm nodule; right hilar/mediastinal lymph nodes.   Patient noted to have intermittent left chest wall pain mild.  She has mild cough however she has been taking antitussives prescribed by PCP.  Mild diarrhea- mild nausea currently improved with antiemetics.  Appetite is good.  No weight loss.  Review of Systems  Constitutional:  Positive for malaise/fatigue. Negative for chills, diaphoresis, fever and weight loss.  HENT:  Negative for nosebleeds and sore throat.   Eyes:  Negative for double vision.  Respiratory:  Positive for cough and sputum production. Negative for hemoptysis and shortness of  breath.   Cardiovascular:  Negative for palpitations, orthopnea and leg swelling.  Gastrointestinal:  Negative for  abdominal pain, blood in stool, constipation, diarrhea, heartburn, melena, nausea and vomiting.  Genitourinary:  Negative for dysuria, frequency and urgency.  Musculoskeletal:  Negative for back pain and joint pain.  Skin: Negative.  Negative for itching and rash.  Neurological:  Negative for dizziness, tingling, focal weakness and weakness.  Endo/Heme/Allergies:  Does not bruise/bleed easily.  Psychiatric/Behavioral:  Negative for depression. The patient is not nervous/anxious and does not have insomnia.      MEDICAL HISTORY:  Past Medical History:  Diagnosis Date   Barrett's esophagus    Dysrhythmia    stress related at work.   GERD (gastroesophageal reflux disease)    Hepatitis C 2008   Hyperlipidemia    Hypertension    Hypothyroidism    Nephrolithiasis    Non-small cell carcinoma of left lung (HCC)    Osteoporosis    Pulmonary mass    Thyroid cancer ([place]) 2012   Thyroid cancer ([place])    Tumor    tumor per pt back of rt eye    SURGICAL HISTORY: Past Surgical History:  Procedure Laterality Date   ABDOMINAL HYSTERECTOMY  2006   COLONOSCOPY WITH PROPOFOL N/A 04/25/2015   Procedure: COLONOSCOPY WITH PROPOFOL;  Surgeon: Josefine Class, MD;  Location: [facility] ENDOSCOPY;  Service: Endoscopy;  Laterality: N/A;   CYSTOSCOPY W/ RETROGRADES Bilateral 05/19/2015   Procedure: CYSTOSCOPY WITH RETROGRADE PYELOGRAM;  Surgeon: Hollice Espy, MD;  Location: [facility] ORS;  Service: Urology;  Laterality: Bilateral;   CYSTOSCOPY W/ URETERAL STENT PLACEMENT Right 05/26/2015   Procedure: CYSTOSCOPY WITH STENT REPLACEMENT;  Surgeon: Hollice Espy, MD;  Location: [facility] ORS;  Service: Urology;  Laterality: Right;   CYSTOSCOPY WITH STENT PLACEMENT Right 05/19/2015   Procedure: CYSTOSCOPY WITH STENT PLACEMENT;  Surgeon: Hollice Espy, MD;  Location: [facility] ORS;  Service: Urology;  Laterality: Right;   CYSTOSCOPY WITH STENT PLACEMENT Right 05/26/2015   Procedure: CYSTOSCOPY WITH STENT PLACEMENT;  Surgeon:  Hollice Espy, MD;  Location: [facility] ORS;  Service: Urology;  Laterality: Right;   CYSTOSCOPY WITH STENT PLACEMENT Right 06/25/2016   Procedure: CYSTOSCOPY WITH STENT PLACEMENT;  Surgeon: Nickie Retort, MD;  Location: [facility] ORS;  Service: Urology;  Laterality: Right;   CYSTOSCOPY WITH STENT PLACEMENT Left 10/31/2017   Procedure: CYSTOSCOPY WITH STENT PLACEMENT;  Surgeon: Hollice Espy, MD;  Location: [facility] ORS;  Service: Urology;  Laterality: Left;   CYSTOSCOPY/URETEROSCOPY/HOLMIUM LASER/STENT PLACEMENT Left 07/23/2015   Procedure: CYSTOSCOPY/URETEROSCOPY/HOLMIUM LASER/STENT PLACEMENT/RETROGRADE PYELOGRAM;  Surgeon: Hollice Espy, MD;  Location: [facility] ORS;  Service: Urology;  Laterality: Left;   CYSTOSCOPY/URETEROSCOPY/HOLMIUM LASER/STENT PLACEMENT Left 11/16/2017   Procedure: CYSTOSCOPY/URETEROSCOPY/HOLMIUM LASER/STENT Exchange;  Surgeon: Hollice Espy, MD;  Location: [facility] ORS;  Service: Urology;  Laterality: Left;   ESOPHAGOGASTRODUODENOSCOPY (EGD) WITH PROPOFOL N/A 04/25/2015   Procedure: ESOPHAGOGASTRODUODENOSCOPY (EGD) WITH PROPOFOL;  Surgeon: Josefine Class, MD;  Location: [facility] ENDOSCOPY;  Service: Endoscopy;  Laterality: N/A;   EYE SURGERY Bilateral 1964   lazy eye repair   EYE SURGERY Bilateral 2001   lazy eye repair   IR IMAGING GUIDED PORT INSERTION  12/08/2020   LITHOTRIPSY  2009   THYROIDECTOMY  2010   URETEROSCOPY WITH HOLMIUM LASER LITHOTRIPSY Right 05/19/2015   Procedure: URETEROSCOPY WITH HOLMIUM LASER LITHOTRIPSY;  Surgeon: Hollice Espy, MD;  Location: [facility] ORS;  Service: Urology;  Laterality: Right;   URETEROSCOPY WITH HOLMIUM LASER LITHOTRIPSY Right 05/26/2015   Procedure: URETEROSCOPY WITH HOLMIUM LASER LITHOTRIPSY;  Surgeon: Hollice Espy, MD;  Location: [facility] ORS;  Service: Urology;  Laterality: Right;   URETEROSCOPY WITH HOLMIUM LASER LITHOTRIPSY Right 06/25/2016   Procedure: URETEROSCOPY WITH HOLMIUM LASER LITHOTRIPSY;  Surgeon: Nickie Retort, MD;  Location:  [facility] ORS;  Service: Urology;  Laterality: Right;    SOCIAL HISTORY: Social History   Socioeconomic History   Marital status: Divorced    Spouse name: Not on file   Number of children: Not on file   Years of education: Not on file   Highest education level: Not on file  Occupational History   Not on file  Tobacco Use   Smoking status: Every Day    Packs/day: 1.00    Years: 40.00    Total pack years: 40.00    Types: Cigarettes   Smokeless tobacco: Never  Vaping Use   Vaping Use: Every day  Substance and Sexual Activity   Alcohol use: Not Currently    Comment: socially   Drug use: No   Sexual activity: Not Currently    Birth control/protection: None  Other Topics Concern   Not on file  Social History Narrative   Smoking: 1p 1-2 days since 17 years; alcohol: rare; 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     Social Determinants of Health   Financial Resource Strain: Not on file  Food Insecurity: Not on file  Transportation Needs: Not on file  Physical Activity: Not on file  Stress: Not on file  Social Connections: Not on file  Intimate Partner Violence: Not on file    FAMILY HISTORY: Family History  Problem Relation Age of Onset   CAD Mother    Heart disease Mother    Dementia Father    Bladder Cancer Neg Hx    Prostate cancer Neg Hx    Kidney cancer Neg Hx    Breast cancer Neg Hx     ALLERGIES:  has No Known Allergies.  MEDICATIONS:  Current Outpatient Medications  Medication Sig Dispense Refill   Accu-Chek Softclix Lancets lancets Check glucose once a day 100 each 12   albuterol (VENTOLIN HFA) 108 (90 Base) MCG/ACT inhaler Inhale 2 puffs into the lungs every 6 (six) hours as needed for wheezing or shortness of breath. 8 g 2   amLODipine (NORVASC) 5 MG tablet Take 5 mg by mouth every morning.      benzonatate (TESSALON PERLES) 100 MG capsule Take by mouth 3 (three) times daily as  needed for cough.     blood glucose meter kit and supplies KIT Dispense based on patient and insurance preference. Use up to three times daily as directed. 1 each 0   cholecalciferol (VITAMIN D) 1000 units tablet Take 2,000 Units by mouth daily.     Cranberry-Vitamin C-Probiotic (AZO CRANBERRY PO) Take 1 tablet by mouth daily.     fluticasone-salmeterol (ADVAIR HFA) 45-21 MCG/ACT inhaler Inhale 2 puffs into the lungs 2 (two) times daily. 1 each 12   folic acid (FOLVITE) 1 MG tablet Take 1 tablet (1 mg total) by mouth daily. 90 tablet 1   glipiZIDE (GLUCOTROL) 5 MG tablet Take 1 tablet (5 mg total) by mouth 2 (two) times daily before a meal. 180 tablet 1   HYDROcodone-acetaminophen (NORCO/VICODIN) 5-325 MG tablet Take 1 tablet by mouth every 8 (eight) hours as needed for moderate pain. 60 tablet 0   levothyroxine (SYNTHROID) 175 MCG tablet Sat and Sun ON AN EMPTY STOMACH WITH A GLASS OF WATER  AT LEAST 30-60 MINUTES BEFORE BREAKFAST     levothyroxine (SYNTHROID, LEVOTHROID) 150 MCG tablet Take 150 mcg by mouth at bedtime.      lidocaine-prilocaine (EMLA) cream Apply 1 application topically as needed. 30 g 0   loperamide (IMODIUM) 2 MG capsule Take by mouth as needed for diarrhea or loose stools.     losartan (COZAAR) 100 MG tablet Take 100 mg by mouth every morning.      metoprolol succinate (TOPROL-XL) 25 MG 24 hr tablet Take 25 mg by mouth every morning.   1   montelukast (SINGULAIR) 10 MG tablet Take 1 tablet (10 mg total) by mouth at bedtime. 30 tablet 1   omeprazole (PRILOSEC) 20 MG capsule Take 20 mg by mouth daily before breakfast.      ondansetron (ZOFRAN) 8 MG tablet Take 1 tablet (8 mg total) by mouth every 8 (eight) hours as needed for nausea or vomiting. Start on the third day after chemotherapy 30 tablet 2   prochlorperazine (COMPAZINE) 10 MG tablet Take 1 tablet (10 mg total) by mouth every 6 (six) hours as needed for nausea or vomiting. 30 tablet 2   nystatin (MYCOSTATIN) 100000  UNIT/ML suspension Swish and swallow. Don't drink fluids immediately following administration. (Patient not taking: Reported on 06/24/2021) 400 mL 0   No current facility-administered medications for this visit.   Facility-Administered Medications Ordered in Other Visits  Medication Dose Route Frequency Provider Last Rate Last Admin   sodium chloride flush (NS) 0.9 % injection 10 mL  10 mL Intracatheter PRN Cammie Sickle, MD   10 mL at 08/05/21 1009      .  PHYSICAL EXAMINATION: ECOG PERFORMANCE STATUS: 1 - Symptomatic but completely ambulatory  Vitals:   08/05/21 0913  BP: 109/75  Pulse: 72  Temp: 97.9 F (36.6 C)  SpO2: 99%    Filed Weights   08/05/21 0913  Weight: 187 lb (84.8 kg)   No significant right upper quadrant tenderness noted.  Physical Exam Vitals and nursing note reviewed.  HENT:     Head: Normocephalic and atraumatic.     Mouth/Throat:     Pharynx: Oropharynx is clear.  Eyes:     Extraocular Movements: Extraocular movements intact.     Pupils: Pupils are equal, round, and reactive to light.  Cardiovascular:     Rate and Rhythm: Normal rate and regular rhythm.  Pulmonary:     Comments: Decreased breath sounds bilaterally.  Abdominal:     Palpations: Abdomen is soft.  Musculoskeletal:        General: Normal range of motion.     Cervical back: Normal range of motion.  Skin:    General: Skin is warm.  Neurological:     General: No focal deficit present.     Mental Status: She is alert and oriented to person, place, and time.  Psychiatric:        Behavior: Behavior normal.        Judgment: Judgment normal.      LABORATORY DATA:  I have reviewed the data as listed Lab Results  Component Value Date   WBC 7.6 08/05/2021   HGB 11.8 (L) 08/05/2021   HCT 37.3 08/05/2021   MCV 100.8 (H) 08/05/2021   PLT 294 08/05/2021   Recent Labs    06/24/21 1306 07/15/21 0858 08/05/21 0903  NA 138 140 138  K 3.3* 3.6 3.6  CL 107 106 105  CO2  $Re'26 26 26  'gMj$ GLUCOSE 108* 120* 136*  BUN '9 10 14  '$ CREATININE 0.78 0.82 0.82  CALCIUM 8.4* 8.5* 8.5*  GFRNONAA >60 >60 >60  PROT 6.8 7.0 7.0  ALBUMIN 3.5 3.5 3.5  AST $Re'22 24 26  'gbC$ ALT $R'19 23 25  'CA$ ALKPHOS 69 67 71  BILITOT 0.7 0.3 0.5    RADIOGRAPHIC STUDIES: I have personally reviewed the radiological images as listed and agreed with the findings in the report. MM 3D SCREEN BREAST BILATERAL  Result Date: 07/29/2021 CLINICAL DATA:  Screening. EXAM: DIGITAL SCREENING BILATERAL MAMMOGRAM WITH TOMOSYNTHESIS AND CAD TECHNIQUE: Bilateral screening digital craniocaudal and mediolateral oblique mammograms were obtained. Bilateral screening digital breast tomosynthesis was performed. The images were evaluated with computer-aided detection. COMPARISON:  Previous exam(s). ACR Breast Density Category b: There are scattered areas of fibroglandular density. FINDINGS: There are no findings suspicious for malignancy. IMPRESSION: No mammographic evidence of malignancy. A result letter of this screening mammogram will be mailed directly to the patient. RECOMMENDATION: Screening mammogram in one year. (Code:SM-B-01Y) BI-RADS CATEGORY  1: Negative. Electronically Signed   By: Lillia Mountain M.D.   On: 07/29/2021 10:21   NM PET Image Restage (PS) Skull Base to Thigh (F-18 FDG)  Result Date: 07/26/2021 CLINICAL DATA:  Subsequent treatment strategy for non-small cell lung cancer. EXAM: NUCLEAR MEDICINE PET SKULL BASE TO THIGH TECHNIQUE: 10.8 mCi F-18 FDG was injected intravenously. Full-ring PET imaging was performed from the skull base to thigh after the radiotracer. CT data was obtained and used for attenuation correction and anatomic localization. Fasting blood glucose: 58 mg/dl COMPARISON:  Chest abdomen pelvis CT 07/14/2021.  PET-CT 11/06/2019. FINDINGS: Mediastinal blood pool activity: SUV max 1.7 Liver activity: SUV max NA NECK: 5 mm left parotid nodule is not substantially changed in the interval although does show  hypermetabolism today with SUV max = 5.3. Incidental CT findings: none CHEST: Knee enlarging left lower lobe pulmonary nodule seen on recent chest CT is markedly hypermetabolic with SUV max = 29.4. No definite hypermetabolism in the described adjacent left lower lobe nodule measuring 5 mm on image 121/2. Subcarinal lymphadenopathy is hypermetabolic with SUV max = 76.5. Hypermetabolic lymphadenopathy in left hilum demonstrates SUV max = 7.4. Scattered tiny ground-glass and solid pulmonary nodules bilaterally are below size threshold for reliable resolution on PET imaging. Incidental CT findings: Right Port-A-Cath tip is positioned in the distal SVC. Mild atherosclerotic calcification is noted in the wall of the thoracic aorta. Aberrant right subclavian artery noted. ABDOMEN/PELVIS: No abnormal hypermetabolic activity within the liver, pancreas, adrenal glands, or spleen. No hypermetabolic lymph nodes in the abdomen or pelvis. Incidental CT findings: Nonobstructing stones are seen in the kidneys bilaterally. Bilateral renal cysts evident. Diverticular changes noted left colon without diverticulitis. There is moderate atherosclerotic calcification of the abdominal aorta without aneurysm. Calcified gallstones evident. SKELETON: No focal hypermetabolic activity to suggest skeletal metastasis. Incidental CT findings: No worrisome lytic or sclerotic osseous abnormality. IMPRESSION: 1. Enlarging left lower lobe pulmonary nodule seen on recent CT scan is markedly hypermetabolic, consistent with neoplasm. 2. Hypermetabolic nodal metastatic disease in the left hilum and central mediastinum. 3. Stable tiny left parotid nodule with increased hypermetabolism in the interval. Metastatic disease not excluded although this may represent a tiny parotid neoplasm. 4. No evidence for hypermetabolic metastatic disease in the abdomen or pelvis. 5. Cholelithiasis. 6. Bilateral nonobstructing renal stones with bilateral renal cysts. 7.   Aortic Atherosclerois (ICD10-170.0) Electronically Signed   By: Misty Stanley M.D.   On: 07/26/2021 13:32   CT CHEST ABDOMEN PELVIS W  CONTRAST  Result Date: 07/14/2021 CLINICAL DATA:  Metastatic left lower lobe non-small cell lung cancer, restaging. Ongoing chemo therapy and immunotherapy. Patient complains of chest pain and dry cough with shortness of breath x3 weeks. * Tracking Code: BO * EXAM: CT CHEST, ABDOMEN, AND PELVIS WITH CONTRAST TECHNIQUE: Multidetector CT imaging of the chest, abdomen and pelvis was performed following the standard protocol during bolus administration of intravenous contrast. RADIATION DOSE REDUCTION: This exam was performed according to the departmental dose-optimization program which includes automated exposure control, adjustment of the mA and/or kV according to patient size and/or use of iterative reconstruction technique. CONTRAST:  180mL OMNIPAQUE IOHEXOL 300 MG/ML  SOLN COMPARISON:  Multiple priors including most recent CT March 23, 2021. FINDINGS: CT CHEST FINDINGS Cardiovascular: Right chest wall Port-A-Cath with tip in the right atrium. Aortic and branch vessel atherosclerosis without abdominal aortic aneurysm. No central pulmonary embolus on this nondedicated study. Normal size heart. No significant pericardial effusion/thickening. Mediastinum/Nodes: Interval increase in size of the subcarinal and left hilar lymph nodes. Previously indexed left hilar lymph node now measure 14 mm in short axis on image 27/2, previously 9 mm. Left hilar lymph node had become too small to accurately characterize on most recent prior imaging but was enlarged on imaging studies prior to that including January 16, 2021 now measures 11 mm in short axis on image 30/2. Additional prominent mediastinal lymph nodes not pathologically enlarged by size criteria are not significantly changed from prior studies. No right hilar or supraclavicular adenopathy. No axillary adenopathy. Small hiatal hernia.  Lungs/Pleura: Interval increase in size in the spiculated left lower lobe pulmonary nodule in the inferior costophrenic recess which now measures 2.5 x 1.9 cm on image 124/3 previously 1.6 x 1.2 cm. New solid left lower lobe pulmonary nodule adjacent to the spiculated lesion best seen on coronal image 115/4. Similar appearance of the previously hypermetabolic pleural nodules about the left lower lobe measuring no greater than 1-2 mm in thickness for instance on image 49/2 and image 43/2. No significant interval change in the numerous scattered generally amorphous bilateral upper lobe predominant ground-glass opacities for instance on image 42/3, 34/3, and 20/3. No pleural effusion.  No pneumothorax. Musculoskeletal: No aggressive lytic or blastic lesion of bone. Subacute left ninth rib fracture on image 135/3. Multilevel degenerative changes spine and degenerative changes shoulders. CT ABDOMEN PELVIS FINDINGS Hepatobiliary: Hepatomegaly measuring 21 cm in maximum coronal span. Hepatic steatosis. Stable hyperenhancing segment V 11 mm focus adjacent to the gallbladder fossa on image 62/2 with associated transient hepatic attenuation difference on image 65/2, unchanged dating back to at least 2019, consistent with a benign finding such as a flash filling hemangioma. Benign cyst hepatic cysts in segment V in the caudate lobe. Cholelithiasis without findings of acute cholecystitis. No gallbladder wall thickening, or biliary dilatation. Pancreas: No biliary ductal dilation or evidence of acute inflammation. Soft tissue nodule along the tail of the pancreas measuring 18 mm on image 59/2 is stable dating back to Jun 18, 2017 and most consistent with a splenule. Spleen: No splenomegaly or focal splenic lesion. Adrenals/Urinary Tract: Bilateral adrenal glands are within normal limits. No hydronephrosis. Bilateral renal cysts requiring no follow-up. Nonobstructive bilateral renal stones. Calcified gonadal vein phleboliths on  the course of the left ureter. Stomach/Bowel: Radiopaque enteric contrast material traverses the hepatic flexure. Stomach is minimally distended without overt abnormal wall thickening. No pathologic dilation of small or large bowel. The appendix and terminal ileum appear normal. Colonic diverticulosis without findings of acute  diverticulitis. Vascular/Lymphatic: Aortic and branch vessel atherosclerosis without abdominal aortic aneurysm. Pathologically enlarged abdominal or pelvic lymph nodes. Reproductive: Status post hysterectomy.  No suspicious adnexal mass. Other: Stable benign simple appearing cyst overlying the right psoas muscle measures 3 cm and most likely reflects a lymphocele or postoperative seroma. No discrete peritoneal or omental nodularity. No significant abdominopelvic free fluid. Musculoskeletal: Multilevel degenerative changes spine. No aggressive lytic or blastic lesion of bone. IMPRESSION: 1. Interval increase in size of the 2.5 cm spiculated left lower lobe pulmonary nodule with a new solid 5 mm nodule adjacent to the spiculated mass as well as increased size of the subcarinal and hilar lymph nodes, findings which are concerning for disease progression. 2. No significant interval change in the numerous scattered generally amorphous bilateral upper lobe predominant ground-glass opacities, which are again favored to reflect an infectious or inflammatory etiology. Continued attention on follow-up imaging suggested. 3. No evidence of metastatic disease within the abdomen or pelvis. 4. Hepatomegaly and hepatic steatosis. 5.  Aortic Atherosclerosis (ICD10-I70.0). Electronically Signed   By: Dahlia Bailiff M.D.   On: 07/14/2021 11:31    ASSESSMENT & PLAN:   Cancer of lower lobe of left lung ([place]) #Stage IV -non-small cell favor adenocarcinoma; s/p carbo-alimta-keytruda. CUrrently on Alimta-keytruda maintrence MAY 30th, 2023- Interval increase in size of the 2.5 cm spiculated left lower lobe  pulmonary nodule with a new solid 5 mm nodule adjacent to the spiculated mass as well as increased size of the subcarinal and hilar lymph nodes, findings which are concerning for disease progression.JUNE 8th, 2023-  Enlarging left lower lobe pulmonary nodule seen on recent CT scan is markedly hypermetabolic, consistent with neoplasm; Hypermetabolic nodal metastatic disease in the left hilum and central mediastinum.   #Proceed with  maintaintnance  Alimta ONLY today Labs today reviewed;mild anemia- Hb 11;   acceptable for treatment today.  Hold Keytruda for the next 2 cycles given the upcoming radiation.  Discussed with Dr. Donella Stade plans radiation 20 fractions to the hilar/mediastinal lymph nodes/and the left lower lobe nodule.  #Left parotid incidental uptake - PET June 8th, 2023-stable tiny left parotid nodule with increased hypermetabolism in the interval. Metastatic disease not excluded although this may represent a tiny parotid neoplasm. HOLD off ENT referral- discussed with pt..   Monitor for now  # COPD [Dr.A]/allergies likely secondary to poorly controlled COPD.  Recommend using albuterol 3-4 times a day and also compliance with Advair.  Recommend Singulair.  Ambulatory heart rate/pulse ox-80s; greater than 95.  No concern for immunotherapy induced pneumonitis.  # Left chest wall pain:-improved/resolved-using hydrocodone qhs-STABLE;   # Right eye vision changes-3.6 mm lesion noted in the right eye;s/p ophthalmology evaluation at [facility]. 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 STABLE;   # Diabetes-A1c 6.9 [AUG 2022]-Fatsing-95.  Continue glipizide 5 mg BID STABLE;   # Diarrhea-G-1-on immoiudm/ Oral mucositis- G-1- STABLE;    # Nausea- G-1 -Zofran Dex Compazine Zofran- [if worse would consider Aloxi] STABLE;   # Hypocalcemia: on Vit D BID [8.5].  Awaiting- VitD levels.  STABLE;    #  Mediport placement-s/p dye study c tPA-functioning. STABLE;   * B12 - on 6/21;  HOLD -keytruda 6/21& 7/12  #DISPOSITION: # Alimta ONLY; HOLD-keytruda today- # 3 week- MD- labs- cbc/cmp; ONLY Alimta Dr.B  # I reviewed the blood work- with the patient in detail; also reviewed the imaging independently [as summarized above]; and with the patient in detail.  All questions were answered. The patient knows to call the clinic with any problems, questions or concerns.       Cammie Sickle, MD 08/05/2021 12:48 PM

## 2021-08-05 NOTE — Progress Notes (Signed)
Pet results.  Discuss radiation. Sees Dr. Donella Stade this afternoon.

## 2021-08-06 ENCOUNTER — Telehealth: Payer: Self-pay | Admitting: *Deleted

## 2021-08-06 NOTE — Telephone Encounter (Signed)
Received email from pt that she has a few questions that she would like to discuss with Dr. Rogue Bussing. She would like to know his thoughts on radiation treatments.   CB# [phone]

## 2021-08-09 ENCOUNTER — Encounter: Payer: Self-pay | Admitting: Internal Medicine

## 2021-08-09 ENCOUNTER — Telehealth: Payer: Self-pay | Admitting: Internal Medicine

## 2021-08-11 ENCOUNTER — Ambulatory Visit: Admission: RE | Admit: 2021-08-11 | Payer: 59 | Source: Ambulatory Visit

## 2021-08-11 ENCOUNTER — Ambulatory Visit: Payer: 59

## 2021-08-12 ENCOUNTER — Ambulatory Visit: Payer: 59

## 2021-08-12 DIAGNOSIS — Z5111 Encounter for antineoplastic chemotherapy: Secondary | ICD-10-CM | POA: Diagnosis not present

## 2021-08-13 ENCOUNTER — Ambulatory Visit: Payer: 59

## 2021-08-14 ENCOUNTER — Other Ambulatory Visit: Payer: Self-pay | Admitting: Internal Medicine

## 2021-08-14 ENCOUNTER — Ambulatory Visit: Payer: 59

## 2021-08-17 ENCOUNTER — Ambulatory Visit: Payer: 59

## 2021-08-19 ENCOUNTER — Ambulatory Visit: Payer: 59

## 2021-08-19 ENCOUNTER — Encounter: Payer: Self-pay | Admitting: Internal Medicine

## 2021-08-19 DIAGNOSIS — C3432 Malignant neoplasm of lower lobe, left bronchus or lung: Secondary | ICD-10-CM | POA: Insufficient documentation

## 2021-08-19 DIAGNOSIS — Z510 Encounter for antineoplastic radiation therapy: Secondary | ICD-10-CM | POA: Insufficient documentation

## 2021-08-19 DIAGNOSIS — Z5111 Encounter for antineoplastic chemotherapy: Secondary | ICD-10-CM | POA: Diagnosis not present

## 2021-08-20 ENCOUNTER — Ambulatory Visit
Admission: RE | Admit: 2021-08-20 | Discharge: 2021-08-20 | Disposition: A | Discharge status: Home / Self Care | Payer: 59 | Source: Ambulatory Visit | Attending: Radiation Oncology | Admitting: Radiation Oncology

## 2021-08-20 ENCOUNTER — Other Ambulatory Visit: Payer: Self-pay

## 2021-08-20 DIAGNOSIS — C782 Secondary malignant neoplasm of pleura: Secondary | ICD-10-CM | POA: Diagnosis not present

## 2021-08-20 DIAGNOSIS — Z79899 Other long term (current) drug therapy: Secondary | ICD-10-CM | POA: Diagnosis not present

## 2021-08-20 DIAGNOSIS — Z7984 Long term (current) use of oral hypoglycemic drugs: Secondary | ICD-10-CM | POA: Diagnosis not present

## 2021-08-20 DIAGNOSIS — Z5111 Encounter for antineoplastic chemotherapy: Secondary | ICD-10-CM | POA: Diagnosis present

## 2021-08-20 DIAGNOSIS — R197 Diarrhea, unspecified: Secondary | ICD-10-CM | POA: Diagnosis not present

## 2021-08-20 DIAGNOSIS — E119 Type 2 diabetes mellitus without complications: Secondary | ICD-10-CM | POA: Diagnosis not present

## 2021-08-20 DIAGNOSIS — R110 Nausea: Secondary | ICD-10-CM | POA: Diagnosis not present

## 2021-08-20 DIAGNOSIS — C3432 Malignant neoplasm of lower lobe, left bronchus or lung: Secondary | ICD-10-CM | POA: Diagnosis present

## 2021-08-20 DIAGNOSIS — Z510 Encounter for antineoplastic radiation therapy: Secondary | ICD-10-CM | POA: Diagnosis present

## 2021-08-20 DIAGNOSIS — Z9221 Personal history of antineoplastic chemotherapy: Secondary | ICD-10-CM | POA: Diagnosis not present

## 2021-08-20 DIAGNOSIS — F17210 Nicotine dependence, cigarettes, uncomplicated: Secondary | ICD-10-CM | POA: Diagnosis not present

## 2021-08-20 DIAGNOSIS — J449 Chronic obstructive pulmonary disease, unspecified: Secondary | ICD-10-CM | POA: Diagnosis not present

## 2021-08-20 DIAGNOSIS — Z85850 Personal history of malignant neoplasm of thyroid: Secondary | ICD-10-CM | POA: Diagnosis not present

## 2021-08-20 LAB — RAD ONC ARIA SESSION SUMMARY
Course Elapsed Days: 0
Plan Fractions Treated to Date: 1
Plan Prescribed Dose Per Fraction: 2 Gy
Plan Total Fractions Prescribed: 20
Plan Total Prescribed Dose: 40 Gy
Reference Point Dosage Given to Date: 2 Gy
Reference Point Session Dosage Given: 2 Gy
Session Number: 1

## 2021-08-21 ENCOUNTER — Ambulatory Visit: Payer: 59

## 2021-08-21 ENCOUNTER — Ambulatory Visit
Admission: RE | Admit: 2021-08-21 | Discharge: 2021-08-21 | Disposition: A | Discharge status: Home / Self Care | Payer: 59 | Source: Ambulatory Visit | Attending: Radiation Oncology | Admitting: Radiation Oncology

## 2021-08-21 ENCOUNTER — Other Ambulatory Visit: Payer: Self-pay

## 2021-08-21 DIAGNOSIS — Z5111 Encounter for antineoplastic chemotherapy: Secondary | ICD-10-CM | POA: Diagnosis not present

## 2021-08-21 LAB — RAD ONC ARIA SESSION SUMMARY
Course Elapsed Days: 1
Plan Fractions Treated to Date: 2
Plan Prescribed Dose Per Fraction: 2 Gy
Plan Total Fractions Prescribed: 20
Plan Total Prescribed Dose: 40 Gy
Reference Point Dosage Given to Date: 4 Gy
Reference Point Session Dosage Given: 2 Gy
Session Number: 2

## 2021-08-24 ENCOUNTER — Ambulatory Visit
Admission: RE | Admit: 2021-08-24 | Discharge: 2021-08-24 | Disposition: A | Discharge status: Home / Self Care | Payer: 59 | Source: Ambulatory Visit | Attending: Radiation Oncology | Admitting: Radiation Oncology

## 2021-08-24 ENCOUNTER — Ambulatory Visit: Payer: 59

## 2021-08-24 ENCOUNTER — Other Ambulatory Visit: Payer: Self-pay

## 2021-08-24 DIAGNOSIS — Z5111 Encounter for antineoplastic chemotherapy: Secondary | ICD-10-CM | POA: Diagnosis not present

## 2021-08-24 LAB — RAD ONC ARIA SESSION SUMMARY
Course Elapsed Days: 4
Plan Fractions Treated to Date: 3
Plan Prescribed Dose Per Fraction: 2 Gy
Plan Total Fractions Prescribed: 20
Plan Total Prescribed Dose: 40 Gy
Reference Point Dosage Given to Date: 6 Gy
Reference Point Session Dosage Given: 2 Gy
Session Number: 3

## 2021-08-25 ENCOUNTER — Encounter: Payer: Self-pay | Admitting: Internal Medicine

## 2021-08-25 ENCOUNTER — Other Ambulatory Visit: Payer: Self-pay

## 2021-08-25 ENCOUNTER — Ambulatory Visit
Admission: RE | Admit: 2021-08-25 | Discharge: 2021-08-25 | Disposition: A | Discharge status: Home / Self Care | Payer: 59 | Source: Ambulatory Visit | Attending: Radiation Oncology | Admitting: Radiation Oncology

## 2021-08-25 DIAGNOSIS — Z5111 Encounter for antineoplastic chemotherapy: Secondary | ICD-10-CM | POA: Diagnosis not present

## 2021-08-25 LAB — RAD ONC ARIA SESSION SUMMARY
Course Elapsed Days: 5
Plan Fractions Treated to Date: 4
Plan Prescribed Dose Per Fraction: 2 Gy
Plan Total Fractions Prescribed: 20
Plan Total Prescribed Dose: 40 Gy
Reference Point Dosage Given to Date: 8 Gy
Reference Point Session Dosage Given: 2 Gy
Session Number: 4

## 2021-08-25 MED FILL — Dexamethasone Sodium Phosphate Inj 100 MG/10ML: INTRAMUSCULAR | Qty: 1 | Status: AC

## 2021-08-26 ENCOUNTER — Inpatient Hospital Stay: Payer: 59 | Attending: Internal Medicine

## 2021-08-26 ENCOUNTER — Ambulatory Visit
Admission: RE | Admit: 2021-08-26 | Discharge: 2021-08-26 | Disposition: A | Discharge status: Home / Self Care | Payer: 59 | Source: Ambulatory Visit | Attending: Radiation Oncology | Admitting: Radiation Oncology

## 2021-08-26 ENCOUNTER — Ambulatory Visit: Payer: 59

## 2021-08-26 ENCOUNTER — Inpatient Hospital Stay (HOSPITAL_BASED_OUTPATIENT_CLINIC_OR_DEPARTMENT_OTHER): Payer: 59 | Admitting: Internal Medicine

## 2021-08-26 ENCOUNTER — Other Ambulatory Visit: Payer: Self-pay

## 2021-08-26 ENCOUNTER — Inpatient Hospital Stay: Payer: 59

## 2021-08-26 ENCOUNTER — Encounter: Payer: Self-pay | Admitting: Internal Medicine

## 2021-08-26 DIAGNOSIS — C3432 Malignant neoplasm of lower lobe, left bronchus or lung: Secondary | ICD-10-CM

## 2021-08-26 DIAGNOSIS — Z85850 Personal history of malignant neoplasm of thyroid: Secondary | ICD-10-CM | POA: Insufficient documentation

## 2021-08-26 DIAGNOSIS — Z9221 Personal history of antineoplastic chemotherapy: Secondary | ICD-10-CM | POA: Insufficient documentation

## 2021-08-26 DIAGNOSIS — J449 Chronic obstructive pulmonary disease, unspecified: Secondary | ICD-10-CM | POA: Insufficient documentation

## 2021-08-26 DIAGNOSIS — Z79899 Other long term (current) drug therapy: Secondary | ICD-10-CM | POA: Insufficient documentation

## 2021-08-26 DIAGNOSIS — R197 Diarrhea, unspecified: Secondary | ICD-10-CM | POA: Insufficient documentation

## 2021-08-26 DIAGNOSIS — F17210 Nicotine dependence, cigarettes, uncomplicated: Secondary | ICD-10-CM | POA: Insufficient documentation

## 2021-08-26 DIAGNOSIS — E119 Type 2 diabetes mellitus without complications: Secondary | ICD-10-CM | POA: Insufficient documentation

## 2021-08-26 DIAGNOSIS — Z5111 Encounter for antineoplastic chemotherapy: Secondary | ICD-10-CM | POA: Insufficient documentation

## 2021-08-26 DIAGNOSIS — R110 Nausea: Secondary | ICD-10-CM | POA: Insufficient documentation

## 2021-08-26 DIAGNOSIS — Z510 Encounter for antineoplastic radiation therapy: Secondary | ICD-10-CM | POA: Insufficient documentation

## 2021-08-26 DIAGNOSIS — C782 Secondary malignant neoplasm of pleura: Secondary | ICD-10-CM | POA: Insufficient documentation

## 2021-08-26 DIAGNOSIS — Z7984 Long term (current) use of oral hypoglycemic drugs: Secondary | ICD-10-CM | POA: Insufficient documentation

## 2021-08-26 LAB — CBC WITH DIFFERENTIAL/PLATELET
Abs Immature Granulocytes: 0.04 10*3/uL (ref 0.00–0.07)
Basophils Absolute: 0 10*3/uL (ref 0.0–0.1)
Basophils Relative: 1 %
Eosinophils Absolute: 0.2 10*3/uL (ref 0.0–0.5)
Eosinophils Relative: 2 %
HCT: 37.2 % (ref 36.0–46.0)
Hemoglobin: 11.6 g/dL — ABNORMAL LOW (ref 12.0–15.0)
Immature Granulocytes: 1 %
Lymphocytes Relative: 14 %
Lymphs Abs: 1.1 10*3/uL (ref 0.7–4.0)
MCH: 31.4 pg (ref 26.0–34.0)
MCHC: 31.2 g/dL (ref 30.0–36.0)
MCV: 100.8 fL — ABNORMAL HIGH (ref 80.0–100.0)
Monocytes Absolute: 0.7 10*3/uL (ref 0.1–1.0)
Monocytes Relative: 9 %
Neutro Abs: 5.8 10*3/uL (ref 1.7–7.7)
Neutrophils Relative %: 73 %
Platelets: 252 10*3/uL (ref 150–400)
RBC: 3.69 MIL/uL — ABNORMAL LOW (ref 3.87–5.11)
RDW: 16.3 % — ABNORMAL HIGH (ref 11.5–15.5)
WBC: 7.9 10*3/uL (ref 4.0–10.5)
nRBC: 0 % (ref 0.0–0.2)

## 2021-08-26 LAB — RAD ONC ARIA SESSION SUMMARY
Course Elapsed Days: 6
Plan Fractions Treated to Date: 5
Plan Prescribed Dose Per Fraction: 2 Gy
Plan Total Fractions Prescribed: 20
Plan Total Prescribed Dose: 40 Gy
Reference Point Dosage Given to Date: 10 Gy
Reference Point Session Dosage Given: 2 Gy
Session Number: 5

## 2021-08-26 LAB — COMPREHENSIVE METABOLIC PANEL
ALT: 23 U/L (ref 0–44)
AST: 22 U/L (ref 15–41)
Albumin: 3.5 g/dL (ref 3.5–5.0)
Alkaline Phosphatase: 71 U/L (ref 38–126)
Anion gap: 8 (ref 5–15)
BUN: 13 mg/dL (ref 8–23)
CO2: 26 mmol/L (ref 22–32)
Calcium: 8.7 mg/dL — ABNORMAL LOW (ref 8.9–10.3)
Chloride: 106 mmol/L (ref 98–111)
Creatinine, Ser: 0.69 mg/dL (ref 0.44–1.00)
GFR, Estimated: >60 mL/min (ref >60)
Glucose, Bld: 130 mg/dL — ABNORMAL HIGH (ref 70–99)
Potassium: 3.7 mmol/L (ref 3.5–5.1)
Sodium: 140 mmol/L (ref 135–145)
Total Bilirubin: 0.4 mg/dL (ref 0.3–1.2)
Total Protein: 6.9 g/dL (ref 6.5–8.1)

## 2021-08-26 MED ORDER — HEPARIN SOD (PORK) LOCK FLUSH 100 UNIT/ML IV SOLN
INTRAVENOUS | Status: AC
  Administered 2021-08-26: 500 [IU]
  Filled 2021-08-26: qty 5

## 2021-08-26 MED ORDER — SODIUM CHLORIDE 0.9% FLUSH
10.0000 mL | Freq: Once | INTRAVENOUS | Status: AC
  Administered 2021-08-26: 10 mL via INTRAVENOUS
  Filled 2021-08-26: qty 10

## 2021-08-26 MED ORDER — ONDANSETRON HCL 4 MG/2ML IJ SOLN
8.0000 mg | Freq: Once | INTRAMUSCULAR | Status: AC
  Administered 2021-08-26: 8 mg via INTRAVENOUS
  Filled 2021-08-26: qty 4

## 2021-08-26 MED ORDER — SODIUM CHLORIDE 0.9 % IV SOLN
Freq: Once | INTRAVENOUS | Status: AC
  Filled 2021-08-26: qty 250

## 2021-08-26 MED ORDER — SODIUM CHLORIDE 0.9 % IV SOLN
500.0000 mg/m2 | Freq: Once | INTRAVENOUS | Status: AC
  Administered 2021-08-26: 1000 mg via INTRAVENOUS
  Filled 2021-08-26: qty 40

## 2021-08-26 MED ORDER — SODIUM CHLORIDE 0.9 % IV SOLN
10.0000 mg | Freq: Once | INTRAVENOUS | Status: AC
  Administered 2021-08-26: 10 mg via INTRAVENOUS
  Filled 2021-08-26: qty 10

## 2021-08-26 MED ORDER — HEPARIN SOD (PORK) LOCK FLUSH 100 UNIT/ML IV SOLN
500.0000 [IU] | Freq: Once | INTRAVENOUS | Status: AC | PRN
  Filled 2021-08-26: qty 5

## 2021-08-26 MED ORDER — SODIUM CHLORIDE 0.9% FLUSH
10.0000 mL | INTRAVENOUS | Status: DC | PRN
  Administered 2021-08-26: 10 mL
  Filled 2021-08-26: qty 10

## 2021-08-26 MED ORDER — PROCHLORPERAZINE MALEATE 10 MG PO TABS
10.0000 mg | ORAL_TABLET | Freq: Once | ORAL | Status: AC
  Administered 2021-08-26: 10 mg via ORAL
  Filled 2021-08-26: qty 1

## 2021-08-26 MED ORDER — CYANOCOBALAMIN 1000 MCG/ML IJ SOLN: 1000.0000 ug | Freq: Once | INTRAMUSCULAR | Status: DC

## 2021-08-26 MED ORDER — SODIUM CHLORIDE 0.9 % IV SOLN: 8.0000 mg | Freq: Once | INTRAVENOUS | Status: DC

## 2021-08-26 NOTE — Progress Notes (Signed)
[name] CONSULT NOTE  Patient Care Team: Dion Body, MD as PCP - General (Family Medicine) Leata Mouse (Inactive) Byrnett, Forest Gleason, MD (General Surgery) Telford Nab, RN as Oncology Nurse Navigator Cammie Sickle, MD as Consulting Physician (Oncology)  CHIEF COMPLAINTS/PURPOSE OF CONSULTATION: lung cancer  Oncology History Overview Note  IMPRESSION: Hypermetabolic solid pulmonary nodule of the left lower lobe, favor primary lung malignancy.   Numerous hypermetabolic nodules of the lower left pleura, compatible with pleural metastatic disease.   Hypermetabolic subcarinal and left hilar lymph nodes, compatible with metastatic disease.   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 Recommend attention on follow-up.   Mild asymmetric FDG uptake below mediastinal blood pool within a small nodule of the left parotid gland, favored to be benign. Recommend attention on follow-up.   No evidence of metastatic disease in the abdomen or pelvis.   No evidence of osseous metastatic disease.   ---------------------  # MARCH 2022- incidental LLL [ 0.9 cm] s/p COVID vaccine; [Dr.Aleskerov]; July 2022- left pleural pain-   #  #October 2022 MRI brain-punctate lesion-asymptomatic [D/w- Dr.Vaslow]; December 2022 MRI negative for any acute process; chronic sclerosis of hippocampus-clinically insignificant.  COPD-non-complaint    ------------------   DIAGNOSIS:  A. PLEURAL MASS, LEFT CHEST; BIOPSY:  - DIAGNOSTIC OF MALIGNANCY.  - NON-SMALL CELL CARCINOMA, FAVOR ADENOCARCINOMA.   Comment:  The tumor cells are positive for CK7 and TTF1 (patchy).  They are  negative for p40.   # 12/10/2020-carbo Alimta; cycle #1.    # JUNE 8th, 2023-  Enlarging left  lower lobe pulmonary nodule seen on recent CT scan is markedly hypermetabolic, consistent with neoplasm; Hypermetabolic nodal metastatic disease in the left hilum and central mediastinum. JUNE- 2023- RT 20 Fractions.   #NGS PD-L1 TPS 1%   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[place])  05/02/2015 Initial Diagnosis   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HCC)   Cancer of lower lobe of left lung ([place])  12/02/2020 Initial Diagnosis   Cancer of lower lobe of left lung ([place])   12/10/2020 Cancer Staging   Staging form: Lung, AJCC 8th Edition - Clinical: Stage IVA (cT4, cN3, cM1a) - Signed by Cammie Sickle, MD on 12/10/2020   12/10/2020 -  Chemotherapy   Patient is on Treatment Plan : LUNG CARBOplatin / Pemetrexed / Pembrolizumab q21d Induction x 4 cycles / Maintenance Pemetrexed + Pembrolizumab        HISTORY OF PRESENTING ILLNESS: Ambulating independently.  Alone.  Felicia Manning 66 y.o.  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   Patient currently undergoing radiation for increasing size of the left lower lobe pulm nodule; right hilar/mediastinal lymph nodes.  She is currently status post 4 fractions of the planned 20 fractions.  [name] is on hold while she is getting radiation.  Mild cough not any worse. Mild diarrhea- mild nausea currently improved with antiemetics.  Appetite is good.  No weight loss.  Review of Systems  Constitutional:  Positive for malaise/fatigue. Negative for chills, diaphoresis, fever and weight loss.  HENT:  Negative for nosebleeds and sore throat.   Eyes:  Negative for double vision.  Respiratory:  Positive for cough and sputum production. Negative for hemoptysis and shortness of breath.  Cardiovascular:  Negative for palpitations, orthopnea and leg swelling.  Gastrointestinal:  Negative for abdominal pain, blood  in stool, constipation, diarrhea, heartburn, melena, nausea and vomiting.  Genitourinary:  Negative for dysuria, frequency and urgency.  Musculoskeletal:  Negative for back pain and joint pain.  Skin: Negative.  Negative for itching and rash.  Neurological:  Negative for dizziness, tingling, focal weakness and weakness.  Endo/Heme/Allergies:  Does not bruise/bleed easily.  Psychiatric/Behavioral:  Negative for depression. The patient is not nervous/anxious and does not have insomnia.      MEDICAL HISTORY:  Past Medical History:  Diagnosis Date   Barrett's esophagus    Dysrhythmia    stress related at work.   GERD (gastroesophageal reflux disease)    Hepatitis C 2008   Hyperlipidemia    Hypertension    Hypothyroidism    Nephrolithiasis    Non-small cell carcinoma of left lung (HCC)    Osteoporosis    Pulmonary mass    Thyroid cancer ([place]) 2012   Thyroid cancer ([place])    Tumor    tumor per pt back of rt eye    SURGICAL HISTORY: Past Surgical History:  Procedure Laterality Date   ABDOMINAL HYSTERECTOMY  2006   COLONOSCOPY WITH PROPOFOL N/A 04/25/2015   Procedure: COLONOSCOPY WITH PROPOFOL;  Surgeon: Josefine Class, MD;  Location: [facility] ENDOSCOPY;  Service: Endoscopy;  Laterality: N/A;   CYSTOSCOPY W/ RETROGRADES Bilateral 05/19/2015   Procedure: CYSTOSCOPY WITH RETROGRADE PYELOGRAM;  Surgeon: Hollice Espy, MD;  Location: [facility] ORS;  Service: Urology;  Laterality: Bilateral;   CYSTOSCOPY W/ URETERAL STENT PLACEMENT Right 05/26/2015   Procedure: CYSTOSCOPY WITH STENT REPLACEMENT;  Surgeon: Hollice Espy, MD;  Location: [facility] ORS;  Service: Urology;  Laterality: Right;   CYSTOSCOPY WITH STENT PLACEMENT Right 05/19/2015   Procedure: CYSTOSCOPY WITH STENT PLACEMENT;  Surgeon: Hollice Espy, MD;  Location: [facility] ORS;  Service: Urology;  Laterality: Right;   CYSTOSCOPY WITH STENT PLACEMENT Right 05/26/2015   Procedure: CYSTOSCOPY WITH STENT PLACEMENT;  Surgeon: Hollice Espy, MD;   Location: [facility] ORS;  Service: Urology;  Laterality: Right;   CYSTOSCOPY WITH STENT PLACEMENT Right 06/25/2016   Procedure: CYSTOSCOPY WITH STENT PLACEMENT;  Surgeon: Nickie Retort, MD;  Location: [facility] ORS;  Service: Urology;  Laterality: Right;   CYSTOSCOPY WITH STENT PLACEMENT Left 10/31/2017   Procedure: CYSTOSCOPY WITH STENT PLACEMENT;  Surgeon: Hollice Espy, MD;  Location: [facility] ORS;  Service: Urology;  Laterality: Left;   CYSTOSCOPY/URETEROSCOPY/HOLMIUM LASER/STENT PLACEMENT Left 07/23/2015   Procedure: CYSTOSCOPY/URETEROSCOPY/HOLMIUM LASER/STENT PLACEMENT/RETROGRADE PYELOGRAM;  Surgeon: Hollice Espy, MD;  Location: [facility] ORS;  Service: Urology;  Laterality: Left;   CYSTOSCOPY/URETEROSCOPY/HOLMIUM LASER/STENT PLACEMENT Left 11/16/2017   Procedure: CYSTOSCOPY/URETEROSCOPY/HOLMIUM LASER/STENT Exchange;  Surgeon: Hollice Espy, MD;  Location: [facility] ORS;  Service: Urology;  Laterality: Left;   ESOPHAGOGASTRODUODENOSCOPY (EGD) WITH PROPOFOL N/A 04/25/2015   Procedure: ESOPHAGOGASTRODUODENOSCOPY (EGD) WITH PROPOFOL;  Surgeon: Josefine Class, MD;  Location: [facility] ENDOSCOPY;  Service: Endoscopy;  Laterality: N/A;   EYE SURGERY Bilateral 1964   lazy eye repair   EYE SURGERY Bilateral 2001   lazy eye repair   IR IMAGING GUIDED PORT INSERTION  12/08/2020   LITHOTRIPSY  2009   THYROIDECTOMY  2010   URETEROSCOPY WITH HOLMIUM LASER LITHOTRIPSY Right 05/19/2015   Procedure: URETEROSCOPY WITH HOLMIUM LASER LITHOTRIPSY;  Surgeon: Hollice Espy, MD;  Location: [facility] ORS;  Service: Urology;  Laterality: Right;   URETEROSCOPY WITH HOLMIUM LASER LITHOTRIPSY Right 05/26/2015   Procedure: URETEROSCOPY WITH HOLMIUM LASER LITHOTRIPSY;  Surgeon: Caryl Pina  Erlene Quan, MD;  Location: [facility] ORS;  Service: Urology;  Laterality: Right;   URETEROSCOPY WITH HOLMIUM LASER LITHOTRIPSY Right 06/25/2016   Procedure: URETEROSCOPY WITH HOLMIUM LASER LITHOTRIPSY;  Surgeon: Nickie Retort, MD;  Location: [facility] ORS;  Service:  Urology;  Laterality: Right;    SOCIAL HISTORY: Social History   Socioeconomic History   Marital status: Divorced    Spouse name: Not on file   Number of children: Not on file   Years of education: Not on file   Highest education level: Not on file  Occupational History   Not on file  Tobacco Use   Smoking status: Every Day    Packs/day: 1.00    Years: 40.00    Total pack years: 40.00    Types: Cigarettes   Smokeless tobacco: Never  Vaping Use   Vaping Use: Every day  Substance and Sexual Activity   Alcohol use: Not Currently    Comment: socially   Drug use: No   Sexual activity: Not Currently    Birth control/protection: None  Other Topics Concern   Not on file  Social History Narrative   Smoking: 1p 1-2 days since 17 years; alcohol: rare; 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     Social Determinants of Health   Financial Resource Strain: Not on file  Food Insecurity: Not on file  Transportation Needs: Not on file  Physical Activity: Not on file  Stress: Not on file  Social Connections: Not on file  Intimate Partner Violence: Not on file    FAMILY HISTORY: Family History  Problem Relation Age of Onset   CAD Mother    Heart disease Mother    Dementia Father    Bladder Cancer Neg Hx    Prostate cancer Neg Hx    Kidney cancer Neg Hx    Breast cancer Neg Hx     ALLERGIES:  has No Known Allergies.  MEDICATIONS:  Current Outpatient Medications  Medication Sig Dispense Refill   Accu-Chek Softclix Lancets lancets Check glucose once a day 100 each 12   albuterol (VENTOLIN HFA) 108 (90 Base) MCG/ACT inhaler Inhale 2 puffs into the lungs every 6 (six) hours as needed for wheezing or shortness of breath. 8 g 2   amLODipine (NORVASC) 5 MG tablet Take 5 mg by mouth every morning.      benzonatate (TESSALON PERLES) 100 MG capsule Take by mouth 3 (three) times daily as needed for cough.      blood glucose meter kit and supplies KIT Dispense based on patient and insurance preference. Use up to three times daily as directed. 1 each 0   cholecalciferol (VITAMIN D) 1000 units tablet Take 2,000 Units by mouth daily.     Cranberry-Vitamin C-Probiotic (AZO CRANBERRY PO) Take 1 tablet by mouth daily.     fluticasone-salmeterol (ADVAIR HFA) 45-21 MCG/ACT inhaler Inhale 2 puffs into the lungs 2 (two) times daily. 1 each 12   folic acid (FOLVITE) 1 MG tablet Take 1 tablet (1 mg total) by mouth daily. 90 tablet 1   glipiZIDE (GLUCOTROL) 5 MG tablet Take 1 tablet (5 mg total) by mouth 2 (two) times daily before a meal. 180 tablet 1   levothyroxine (SYNTHROID) 175 MCG tablet Saturday and sunday     levothyroxine (SYNTHROID, LEVOTHROID) 150 MCG tablet Take 150 mcg by mouth at bedtime. Mon-friday     lidocaine-prilocaine (EMLA) cream Apply 1 application topically as needed. 30 g 0  losartan (COZAAR) 100 MG tablet Take 100 mg by mouth every morning.      metoprolol succinate (TOPROL-XL) 25 MG 24 hr tablet Take 25 mg by mouth every morning.   1   montelukast (SINGULAIR) 10 MG tablet TAKE 1 TABLET BY MOUTH AT BEDTIME 30 tablet 0   omeprazole (PRILOSEC) 20 MG capsule Take 20 mg by mouth daily before breakfast.      ondansetron (ZOFRAN) 8 MG tablet Take 1 tablet (8 mg total) by mouth every 8 (eight) hours as needed for nausea or vomiting. Start on the third day after chemotherapy 30 tablet 2   prochlorperazine (COMPAZINE) 10 MG tablet Take 1 tablet (10 mg total) by mouth every 6 (six) hours as needed for nausea or vomiting. 30 tablet 2   HYDROcodone-acetaminophen (NORCO/VICODIN) 5-325 MG tablet Take 1 tablet by mouth every 8 (eight) hours as needed for moderate pain. (Patient not taking: Reported on 08/26/2021) 60 tablet 0   loperamide (IMODIUM) 2 MG capsule Take by mouth as needed for diarrhea or loose stools. (Patient not taking: Reported on 08/26/2021)     nystatin (MYCOSTATIN) 100000 UNIT/ML suspension  Swish and swallow. Don't drink fluids immediately following administration. (Patient not taking: Reported on 06/24/2021) 400 mL 0   No current facility-administered medications for this visit.   Facility-Administered Medications Ordered in Other Visits  Medication Dose Route Frequency Provider Last Rate Last Admin   heparin lock flush 100 UNIT/ML injection            heparin lock flush 100 unit/mL  500 Units Intracatheter Once PRN Cammie Sickle, MD       PEMEtrexed (ALIMTA) 1,000 mg in sodium chloride 0.9 % 100 mL chemo infusion  500 mg/m2 (Treatment Plan Recorded) Intravenous Once Charlaine Dalton R, MD       sodium chloride flush (NS) 0.9 % injection 10 mL  10 mL Intracatheter PRN Cammie Sickle, MD          .  PHYSICAL EXAMINATION: ECOG PERFORMANCE STATUS: 1 - Symptomatic but completely ambulatory  Vitals:   08/26/21 0902  BP: 124/80  Pulse: 80  Resp: 16  Temp: 97.7 F (36.5 C)  SpO2: 96%    Filed Weights   08/26/21 0902  Weight: 189 lb (85.7 kg)   No significant right upper quadrant tenderness noted.  Physical Exam Vitals and nursing note reviewed.  HENT:     Head: Normocephalic and atraumatic.     Mouth/Throat:     Pharynx: Oropharynx is clear.  Eyes:     Extraocular Movements: Extraocular movements intact.     Pupils: Pupils are equal, round, and reactive to light.  Cardiovascular:     Rate and Rhythm: Normal rate and regular rhythm.  Pulmonary:     Comments: Decreased breath sounds bilaterally.  Abdominal:     Palpations: Abdomen is soft.  Musculoskeletal:        General: Normal range of motion.     Cervical back: Normal range of motion.  Skin:    General: Skin is warm.  Neurological:     General: No focal deficit present.     Mental Status: She is alert and oriented to person, place, and time.  Psychiatric:        Behavior: Behavior normal.        Judgment: Judgment normal.      LABORATORY DATA:  I have reviewed the data as  listed Lab Results  Component Value Date   WBC 7.9 08/26/2021  HGB 11.6 (L) 08/26/2021   HCT 37.2 08/26/2021   MCV 100.8 (H) 08/26/2021   PLT 252 08/26/2021   Recent Labs    07/15/21 0858 08/05/21 0903 08/26/21 0846  NA 140 138 140  K 3.6 3.6 3.7  CL 106 105 106  CO2 26 26 26   GLUCOSE 120* 136* 130*  BUN 10 14 13   CREATININE 0.82 0.82 0.69  CALCIUM 8.5* 8.5* 8.7*  GFRNONAA >60 >60 >60  PROT 7.0 7.0 6.9  ALBUMIN 3.5 3.5 3.5  AST 24 26 22   ALT 23 25 23   ALKPHOS 67 71 71  BILITOT 0.3 0.5 0.4    RADIOGRAPHIC STUDIES: I have personally reviewed the radiological images as listed and agreed with the findings in the report. MM 3D SCREEN BREAST BILATERAL  Result Date: 07/29/2021 CLINICAL DATA:  Screening. EXAM: DIGITAL SCREENING BILATERAL MAMMOGRAM WITH TOMOSYNTHESIS AND CAD TECHNIQUE: Bilateral screening digital craniocaudal and mediolateral oblique mammograms were obtained. Bilateral screening digital breast tomosynthesis was performed. The images were evaluated with computer-aided detection. COMPARISON:  Previous exam(s). ACR Breast Density Category b: There are scattered areas of fibroglandular density. FINDINGS: There are no findings suspicious for malignancy. IMPRESSION: No mammographic evidence of malignancy. A result letter of this screening mammogram will be mailed directly to the patient. RECOMMENDATION: Screening mammogram in one year. (Code:SM-B-01Y) BI-RADS CATEGORY  1: Negative. Electronically Signed   By: Lillia Mountain M.D.   On: 07/29/2021 10:21    ASSESSMENT & PLAN:   Cancer of lower lobe of left lung ([place]) #Stage IV -non-small cell favor adenocarcinoma; s/p carbo-alimta-keytruda. CUrrently on Alimta-keytruda maintrence MAY 30th, 2023- Interval increase in size of the 2.5 cm spiculated left lower lobe pulmonary nodule with a new solid 5 mm nodule adjacent to the spiculated mass as well as increased size of the subcarinal and hilar lymph nodes, findings which are  concerning for disease progression.JUNE 8th, 2023-  Enlarging left lower lobe pulmonary nodule seen on recent CT scan is markedly hypermetabolic, consistent with neoplasm; Hypermetabolic nodal metastatic disease in the left hilum and central mediastinum.   #Proceed with  maintaintnance  Alimta ONLY today Labs today reviewed;mild anemia- Hb 11;   acceptable for treatment today.  Hold Keytruda for the next 2 cycles given the upcoming radiation.  Discussed with Dr. Donella Stade currently on 5/ 20 fractions to the hilar/mediastinal lymph nodes/and the left lower lobe nodule.  #Left parotid incidental uptake - PET June 8th, 2023-stable tiny left parotid nodule with increased hypermetabolism in the interval. Metastatic disease not excluded although this may represent a tiny parotid neoplasm. STABLE.   # COPD [Dr.A]/allergies likely secondary to poorly controlled COPD. Continue using albuterol 3-4 times a day and also compliance with Advair.  continue Singulair.  STABLE  # Left chest wall pain:-improved/resolved-using hydrocodone qhs-STABLE;   # Right eye vision changes-3.6 mm lesion noted in the right eye;s/p ophthalmology evaluation at [facility]. 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 Awaiting revaluation with [facility] opth end of month- STABLE;   # Diabetes-A1c 6.9 [AUG 2022]-Fatsing-95.  Continue glipizide 5 mg BID STABLE;   # Diarrhea-G-1-on immoiudm/ Oral mucositis- G-1-STABLE;     # Nausea- G-1 -Zofran Dex Compazine Zofran- [if worse would consider Aloxi] STABLE;   # Hypocalcemia: on Vit D BID [8.5].  JUNE  VitD levels-48.  STABLE;    # Mediport placement-s/p dye study c tPA-functioning. STABLE;   * B12 - on 6/21;  HOLD -keytruda 6/21& 7/12 &  8/02.   #DISPOSITION: # Alimta ONLY;  # 3 week- MD- labs- cbc/cmp; ONLY Alimta; B12 injection Dr.B                 All questions were answered.  The patient knows to call the clinic with any problems, questions or concerns.       Cammie Sickle, MD 08/26/2021 10:15 AM

## 2021-08-26 NOTE — Patient Instructions (Signed)
[facility] [place]  Discharge Instructions: Thank you for choosing [facility] to provide your oncology and hematology care.  If you have a lab appointment with the [facility], please go directly to the [facility] and check in at the registration area.  Wear comfortable clothing and clothing appropriate for easy access to any Portacath or PICC line.   We strive to give you quality time with your provider. You may need to reschedule your appointment if you arrive late (15 or more minutes).  Arriving late affects you and other patients whose appointments are after yours.  Also, if you miss three or more appointments without notifying the office, you may be dismissed from the clinic at the provider's discretion.      For prescription refill requests, have your pharmacy contact our office and allow 72 hours for refills to be completed.    Today you received the following chemotherapy and/or immunotherapy agents alimta      To help prevent nausea and vomiting after your treatment, we encourage you to take your nausea medication as directed.  BELOW ARE SYMPTOMS THAT SHOULD BE REPORTED IMMEDIATELY: *FEVER GREATER THAN 100.4 F (38 C) OR HIGHER *CHILLS OR SWEATING *NAUSEA AND VOMITING THAT IS NOT CONTROLLED WITH YOUR NAUSEA MEDICATION *UNUSUAL SHORTNESS OF BREATH *UNUSUAL BRUISING OR BLEEDING *URINARY PROBLEMS (pain or burning when urinating, or frequent urination) *BOWEL PROBLEMS (unusual diarrhea, constipation, pain near the anus) TENDERNESS IN MOUTH AND THROAT WITH OR WITHOUT PRESENCE OF ULCERS (sore throat, sores in mouth, or a toothache) UNUSUAL RASH, SWELLING OR PAIN  UNUSUAL VAGINAL DISCHARGE OR ITCHING   Items with * indicate a potential emergency and should be followed up as soon as possible or go to the Emergency Department if any problems should occur.  Please show the CHEMOTHERAPY ALERT CARD or IMMUNOTHERAPY ALERT CARD at check-in to the  Emergency Department and triage nurse.  Should you have questions after your visit or need to cancel or reschedule your appointment, please contact [facility] [place] [facility] [place]  [phone] and follow the prompts.  Office hours are 8:00 a.m. to 4:30 p.m. Monday - Friday. Please note that voicemails left after 4:00 p.m. may not be returned until the following business day.  We are closed weekends and major holidays. You have access to a nurse at all times for urgent questions. Please call the main number to the clinic [phone] and follow the prompts.  For any non-urgent questions, you may also contact your provider using MyChart. We now offer e-Visits for anyone 48 and older to request care online for non-urgent symptoms. For details visit [email or web address].   Also download the MyChart app! Go to the app store, search "MyChart", open the app, select [facility], and log in with your MyChart username and password.  Masks are optional in the cancer centers. If you would like for your care team to wear a mask while they are taking care of you, please let them know. For doctor visits, patients may have with them one support person who is at least 64 years old. At this time, visitors are not allowed in the infusion area.  Pemetrexed injection What is this medication? PEMETREXED (PEM e TREX ed) is a chemotherapy drug used to treat lung cancers like non-small cell lung cancer and mesothelioma. It may also be used to treat other cancers. This medicine may be used for other purposes; ask your health care provider or pharmacist if you have questions. COMMON  BRAND NAME(S): Alimta, PEMFEXY What should I tell my care team before I take this medication? They need to know if you have any of these conditions: infection (especially a virus infection such as chickenpox, cold sores, or herpes) kidney disease low blood counts, like low white cell, platelet, or red cell counts lung or  breathing disease, like asthma radiation therapy an unusual or allergic reaction to pemetrexed, other medicines, foods, dyes, or preservative pregnant or trying to get pregnant breast-feeding How should I use this medication? This drug is given as an infusion into a vein. It is administered in a hospital or clinic by a specially trained health care professional. Talk to your pediatrician regarding the use of this medicine in children. Special care may be needed. Overdosage: If you think you have taken too much of this medicine contact a poison control center or emergency room at once. NOTE: This medicine is only for you. Do not share this medicine with others. What if I miss a dose? It is important not to miss your dose. Call your doctor or health care professional if you are unable to keep an appointment. What may interact with this medication? This medicine may interact with the following medications: Ibuprofen This list may not describe all possible interactions. Give your health care provider a list of all the medicines, herbs, non-prescription drugs, or dietary supplements you use. Also tell them if you smoke, drink alcohol, or use illegal drugs. Some items may interact with your medicine. What should I watch for while using this medication? Visit your doctor for checks on your progress. This drug may make you feel generally unwell. This is not uncommon, as chemotherapy can affect healthy cells as well as cancer cells. Report any side effects. Continue your course of treatment even though you feel ill unless your doctor tells you to stop. In some cases, you may be given additional medicines to help with side effects. Follow all directions for their use. Call your doctor or health care professional for advice if you get a fever, chills or sore throat, or other symptoms of a cold or flu. Do not treat yourself. This drug decreases your body's ability to fight infections. Try to avoid being  around people who are sick. This medicine may increase your risk to bruise or bleed. Call your doctor or health care professional if you notice any unusual bleeding. Be careful brushing and flossing your teeth or using a toothpick because you may get an infection or bleed more easily. If you have any dental work done, tell your dentist you are receiving this medicine. Avoid taking products that contain aspirin, acetaminophen, ibuprofen, naproxen, or ketoprofen unless instructed by your doctor. These medicines may hide a fever. Call your doctor or health care professional if you get diarrhea or mouth sores. Do not treat yourself. To protect your kidneys, drink water or other fluids as directed while you are taking this medicine. Do not become pregnant while taking this medicine or for 6 months after stopping it. Women should inform their doctor if they wish to become pregnant or think they might be pregnant. Men should not father a child while taking this medicine and for 3 months after stopping it. This may interfere with the ability to father a child. You should talk to your doctor or health care professional if you are concerned about your fertility. There is a potential for serious side effects to an unborn child. Talk to your health care professional or pharmacist for more  information. Do not breast-feed an infant while taking this medicine or for 1 week after stopping it. What side effects may I notice from receiving this medication? Side effects that you should report to your doctor or health care professional as soon as possible: allergic reactions like skin rash, itching or hives, swelling of the face, lips, or tongue breathing problems redness, blistering, peeling or loosening of the skin, including inside the mouth signs and symptoms of bleeding such as bloody or black, tarry stools; red or dark-brown urine; spitting up blood or brown material that looks like coffee grounds; red spots on the  skin; unusual bruising or bleeding from the eye, gums, or nose signs and symptoms of infection like fever or chills; cough; sore throat; pain or trouble passing urine signs and symptoms of kidney injury like trouble passing urine or change in the amount of urine signs and symptoms of liver injury like dark yellow or brown urine; general ill feeling or flu-like symptoms; light-colored stools; loss of appetite; nausea; right upper belly pain; unusually weak or tired; yellowing of the eyes or skin Side effects that usually do not require medical attention (report to your doctor or health care professional if they continue or are bothersome): constipation mouth sores nausea, vomiting unusually weak or tired This list may not describe all possible side effects. Call your doctor for medical advice about side effects. You may report side effects to FDA at 1-800-FDA-1088. Where should I keep my medication? This drug is given in a hospital or clinic and will not be stored at home. NOTE: This sheet is a summary. It may not cover all possible information. If you have questions about this medicine, talk to your doctor, pharmacist, or health care provider.  2023 Elsevier/Gold Standard (2017-03-29 00:00:00)

## 2021-08-26 NOTE — Progress Notes (Signed)
Pt had radiation this morning, scheduled for chemo today.  Denies any concerns.

## 2021-08-26 NOTE — Assessment & Plan Note (Addendum)
#  Stage IV -non-small cell favor adenocarcinoma; s/p carbo-alimta-keytruda. CUrrently on Alimta-keytruda maintrence MAY 30th, 2023- Interval increase in size of the 2.5 cm spiculated left lower lobe pulmonary nodule with a new solid 5 mm nodule adjacent to the spiculated mass as well as increased size of the subcarinal and hilar lymph nodes, findings which are concerning for disease progression.JUNE 8th, 2023-  Enlarging left lower lobe pulmonary nodule seen on recent CT scan is markedly hypermetabolic, consistent with neoplasm; Hypermetabolic nodal metastatic disease in the left hilum and central mediastinum.   #Proceed with  maintaintnance  Alimta ONLY today Labs today reviewed;mild anemia- Hb 11;   acceptable for treatment today.  Hold Keytruda for the next 2 cycles given the upcoming radiation.  Discussed with Dr. Donella Stade currently on 5/ 20 fractions to the hilar/mediastinal lymph nodes/and the left lower lobe nodule.  #Left parotid incidental uptake - PET June 8th, 2023-stable tiny left parotid nodule with increased hypermetabolism in the interval. Metastatic disease not excluded although this may represent a tiny parotid neoplasm. STABLE.   # COPD [Dr.A]/allergies likely secondary to poorly controlled COPD. Continue using albuterol 3-4 times a day and also compliance with Advair.  continue Singulair.  STABLE  # Left chest wall pain:-improved/resolved-using hydrocodone qhs-STABLE;   # Right eye vision changes-3.6 mm lesion noted in the right eye;s/p ophthalmology evaluation at [facility]. 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 Awaiting revaluation with [facility] opth end of month- STABLE;   # Diabetes-A1c 6.9 [AUG 2022]-Fatsing-95.  Continue glipizide 5 mg BID STABLE;   # Diarrhea-G-1-on immoiudm/ Oral mucositis- G-1-STABLE;     # Nausea- G-1 -Zofran Dex Compazine Zofran- [if worse would consider Aloxi]  STABLE;   # Hypocalcemia: on Vit D BID [8.5].  JUNE  VitD levels-48.  STABLE;    # Mediport placement-s/p dye study c tPA-functioning. STABLE;   * B12 - on 6/21;  HOLD -keytruda 6/21& 7/12 & 8/02.   #DISPOSITION: # Alimta ONLY;  # 3 week- MD- labs- cbc/cmp; ONLY Alimta; B12 injection Dr.B

## 2021-08-27 ENCOUNTER — Ambulatory Visit
Admission: RE | Admit: 2021-08-27 | Discharge: 2021-08-27 | Disposition: A | Discharge status: Home / Self Care | Payer: 59 | Source: Ambulatory Visit | Attending: Radiation Oncology | Admitting: Radiation Oncology

## 2021-08-27 ENCOUNTER — Other Ambulatory Visit: Payer: Self-pay

## 2021-08-27 DIAGNOSIS — Z5111 Encounter for antineoplastic chemotherapy: Secondary | ICD-10-CM | POA: Diagnosis not present

## 2021-08-27 LAB — RAD ONC ARIA SESSION SUMMARY
Course Elapsed Days: 7
Plan Fractions Treated to Date: 6
Plan Prescribed Dose Per Fraction: 2 Gy
Plan Total Fractions Prescribed: 20
Plan Total Prescribed Dose: 40 Gy
Reference Point Dosage Given to Date: 12 Gy
Reference Point Session Dosage Given: 2 Gy
Session Number: 6

## 2021-08-28 ENCOUNTER — Ambulatory Visit
Admission: RE | Admit: 2021-08-28 | Discharge: 2021-08-28 | Disposition: A | Discharge status: Home / Self Care | Payer: 59 | Source: Ambulatory Visit | Attending: Radiation Oncology | Admitting: Radiation Oncology

## 2021-08-28 ENCOUNTER — Other Ambulatory Visit: Payer: Self-pay

## 2021-08-28 DIAGNOSIS — Z5111 Encounter for antineoplastic chemotherapy: Secondary | ICD-10-CM | POA: Diagnosis not present

## 2021-08-28 LAB — RAD ONC ARIA SESSION SUMMARY
Course Elapsed Days: 8
Plan Fractions Treated to Date: 7
Plan Prescribed Dose Per Fraction: 2 Gy
Plan Total Fractions Prescribed: 20
Plan Total Prescribed Dose: 40 Gy
Reference Point Dosage Given to Date: 14 Gy
Reference Point Session Dosage Given: 2 Gy
Session Number: 7

## 2021-08-31 ENCOUNTER — Ambulatory Visit
Admission: RE | Admit: 2021-08-31 | Discharge: 2021-08-31 | Disposition: A | Discharge status: Home / Self Care | Payer: 59 | Source: Ambulatory Visit | Attending: Radiation Oncology | Admitting: Radiation Oncology

## 2021-08-31 ENCOUNTER — Other Ambulatory Visit: Payer: Self-pay

## 2021-08-31 DIAGNOSIS — Z5111 Encounter for antineoplastic chemotherapy: Secondary | ICD-10-CM | POA: Diagnosis not present

## 2021-08-31 LAB — RAD ONC ARIA SESSION SUMMARY
Course Elapsed Days: 11
Plan Fractions Treated to Date: 8
Plan Prescribed Dose Per Fraction: 2 Gy
Plan Total Fractions Prescribed: 20
Plan Total Prescribed Dose: 40 Gy
Reference Point Dosage Given to Date: 16 Gy
Reference Point Session Dosage Given: 2 Gy
Session Number: 8

## 2021-09-01 ENCOUNTER — Ambulatory Visit
Admission: RE | Admit: 2021-09-01 | Discharge: 2021-09-01 | Disposition: A | Discharge status: Home / Self Care | Payer: 59 | Source: Ambulatory Visit | Attending: Radiation Oncology | Admitting: Radiation Oncology

## 2021-09-01 ENCOUNTER — Other Ambulatory Visit: Payer: Self-pay

## 2021-09-01 DIAGNOSIS — Z5111 Encounter for antineoplastic chemotherapy: Secondary | ICD-10-CM | POA: Diagnosis not present

## 2021-09-01 LAB — RAD ONC ARIA SESSION SUMMARY
Course Elapsed Days: 12
Plan Fractions Treated to Date: 9
Plan Prescribed Dose Per Fraction: 2 Gy
Plan Total Fractions Prescribed: 20
Plan Total Prescribed Dose: 40 Gy
Reference Point Dosage Given to Date: 18 Gy
Reference Point Session Dosage Given: 2 Gy
Session Number: 9

## 2021-09-02 ENCOUNTER — Encounter: Payer: Self-pay | Admitting: *Deleted

## 2021-09-02 ENCOUNTER — Other Ambulatory Visit: Payer: Self-pay | Admitting: *Deleted

## 2021-09-02 ENCOUNTER — Ambulatory Visit
Admission: RE | Admit: 2021-09-02 | Discharge: 2021-09-02 | Disposition: A | Discharge status: Home / Self Care | Payer: 59 | Source: Ambulatory Visit | Attending: Radiation Oncology | Admitting: Radiation Oncology

## 2021-09-02 ENCOUNTER — Other Ambulatory Visit: Payer: Self-pay

## 2021-09-02 DIAGNOSIS — Z5111 Encounter for antineoplastic chemotherapy: Secondary | ICD-10-CM | POA: Diagnosis not present

## 2021-09-02 LAB — RAD ONC ARIA SESSION SUMMARY
Course Elapsed Days: 13
Plan Fractions Treated to Date: 10
Plan Prescribed Dose Per Fraction: 2 Gy
Plan Total Fractions Prescribed: 20
Plan Total Prescribed Dose: 40 Gy
Reference Point Dosage Given to Date: 20 Gy
Reference Point Session Dosage Given: 2 Gy
Session Number: 10

## 2021-09-03 ENCOUNTER — Inpatient Hospital Stay: Payer: 59

## 2021-09-03 ENCOUNTER — Other Ambulatory Visit: Payer: Self-pay

## 2021-09-03 ENCOUNTER — Ambulatory Visit
Admission: RE | Admit: 2021-09-03 | Discharge: 2021-09-03 | Disposition: A | Discharge status: Home / Self Care | Payer: 59 | Source: Ambulatory Visit | Attending: Radiation Oncology | Admitting: Radiation Oncology

## 2021-09-03 DIAGNOSIS — Z5111 Encounter for antineoplastic chemotherapy: Secondary | ICD-10-CM | POA: Diagnosis not present

## 2021-09-03 LAB — RAD ONC ARIA SESSION SUMMARY
Course Elapsed Days: 14
Plan Fractions Treated to Date: 11
Plan Prescribed Dose Per Fraction: 2 Gy
Plan Total Fractions Prescribed: 20
Plan Total Prescribed Dose: 40 Gy
Reference Point Dosage Given to Date: 22 Gy
Reference Point Session Dosage Given: 2 Gy
Session Number: 11

## 2021-09-04 ENCOUNTER — Inpatient Hospital Stay: Payer: 59

## 2021-09-04 ENCOUNTER — Other Ambulatory Visit: Payer: Self-pay

## 2021-09-04 ENCOUNTER — Ambulatory Visit
Admission: RE | Admit: 2021-09-04 | Discharge: 2021-09-04 | Disposition: A | Discharge status: Home / Self Care | Payer: 59 | Source: Ambulatory Visit | Attending: Radiation Oncology | Admitting: Radiation Oncology

## 2021-09-04 DIAGNOSIS — Z5111 Encounter for antineoplastic chemotherapy: Secondary | ICD-10-CM | POA: Diagnosis not present

## 2021-09-04 LAB — RAD ONC ARIA SESSION SUMMARY
Course Elapsed Days: 15
Plan Fractions Treated to Date: 12
Plan Prescribed Dose Per Fraction: 2 Gy
Plan Total Fractions Prescribed: 20
Plan Total Prescribed Dose: 40 Gy
Reference Point Dosage Given to Date: 24 Gy
Reference Point Session Dosage Given: 2 Gy
Session Number: 12

## 2021-09-07 ENCOUNTER — Inpatient Hospital Stay: Payer: 59

## 2021-09-07 ENCOUNTER — Ambulatory Visit
Admission: RE | Admit: 2021-09-07 | Discharge: 2021-09-07 | Disposition: A | Discharge status: Home / Self Care | Payer: 59 | Source: Ambulatory Visit | Attending: Radiation Oncology | Admitting: Radiation Oncology

## 2021-09-07 ENCOUNTER — Other Ambulatory Visit: Payer: Self-pay

## 2021-09-07 DIAGNOSIS — Z5111 Encounter for antineoplastic chemotherapy: Secondary | ICD-10-CM | POA: Diagnosis not present

## 2021-09-07 LAB — RAD ONC ARIA SESSION SUMMARY
Course Elapsed Days: 18
Plan Fractions Treated to Date: 13
Plan Prescribed Dose Per Fraction: 2 Gy
Plan Total Fractions Prescribed: 20
Plan Total Prescribed Dose: 40 Gy
Reference Point Dosage Given to Date: 26 Gy
Reference Point Session Dosage Given: 2 Gy
Session Number: 13

## 2021-09-08 ENCOUNTER — Ambulatory Visit
Admission: RE | Admit: 2021-09-08 | Discharge: 2021-09-08 | Disposition: A | Discharge status: Home / Self Care | Payer: 59 | Source: Ambulatory Visit | Attending: Radiation Oncology | Admitting: Radiation Oncology

## 2021-09-08 ENCOUNTER — Other Ambulatory Visit: Payer: Self-pay

## 2021-09-08 DIAGNOSIS — Z5111 Encounter for antineoplastic chemotherapy: Secondary | ICD-10-CM | POA: Diagnosis not present

## 2021-09-08 LAB — RAD ONC ARIA SESSION SUMMARY
Course Elapsed Days: 19
Plan Fractions Treated to Date: 14
Plan Prescribed Dose Per Fraction: 2 Gy
Plan Total Fractions Prescribed: 20
Plan Total Prescribed Dose: 40 Gy
Reference Point Dosage Given to Date: 28 Gy
Reference Point Session Dosage Given: 2 Gy
Session Number: 14

## 2021-09-09 ENCOUNTER — Ambulatory Visit
Admission: RE | Admit: 2021-09-09 | Discharge: 2021-09-09 | Disposition: A | Discharge status: Home / Self Care | Payer: 59 | Source: Ambulatory Visit | Attending: Radiation Oncology | Admitting: Radiation Oncology

## 2021-09-09 ENCOUNTER — Other Ambulatory Visit: Payer: Self-pay

## 2021-09-09 DIAGNOSIS — Z5111 Encounter for antineoplastic chemotherapy: Secondary | ICD-10-CM | POA: Diagnosis not present

## 2021-09-09 LAB — RAD ONC ARIA SESSION SUMMARY
Course Elapsed Days: 20
Plan Fractions Treated to Date: 15
Plan Prescribed Dose Per Fraction: 2 Gy
Plan Total Fractions Prescribed: 20
Plan Total Prescribed Dose: 40 Gy
Reference Point Dosage Given to Date: 30 Gy
Reference Point Session Dosage Given: 2 Gy
Session Number: 15

## 2021-09-10 ENCOUNTER — Ambulatory Visit
Admission: RE | Admit: 2021-09-10 | Discharge: 2021-09-10 | Disposition: A | Discharge status: Home / Self Care | Payer: 59 | Source: Ambulatory Visit | Attending: Radiation Oncology | Admitting: Radiation Oncology

## 2021-09-10 ENCOUNTER — Other Ambulatory Visit: Payer: Self-pay

## 2021-09-10 ENCOUNTER — Inpatient Hospital Stay: Payer: 59

## 2021-09-10 DIAGNOSIS — Z5111 Encounter for antineoplastic chemotherapy: Secondary | ICD-10-CM | POA: Diagnosis not present

## 2021-09-10 LAB — RAD ONC ARIA SESSION SUMMARY
Course Elapsed Days: 21
Plan Fractions Treated to Date: 16
Plan Prescribed Dose Per Fraction: 2 Gy
Plan Total Fractions Prescribed: 20
Plan Total Prescribed Dose: 40 Gy
Reference Point Dosage Given to Date: 32 Gy
Reference Point Session Dosage Given: 2 Gy
Session Number: 16

## 2021-09-11 ENCOUNTER — Other Ambulatory Visit: Payer: Self-pay

## 2021-09-11 ENCOUNTER — Inpatient Hospital Stay: Payer: 59

## 2021-09-11 ENCOUNTER — Ambulatory Visit
Admission: RE | Admit: 2021-09-11 | Discharge: 2021-09-11 | Disposition: A | Discharge status: Home / Self Care | Payer: 59 | Source: Ambulatory Visit | Attending: Radiation Oncology | Admitting: Radiation Oncology

## 2021-09-11 DIAGNOSIS — Z5111 Encounter for antineoplastic chemotherapy: Secondary | ICD-10-CM | POA: Diagnosis not present

## 2021-09-11 LAB — RAD ONC ARIA SESSION SUMMARY
Course Elapsed Days: 22
Plan Fractions Treated to Date: 17
Plan Prescribed Dose Per Fraction: 2 Gy
Plan Total Fractions Prescribed: 20
Plan Total Prescribed Dose: 40 Gy
Reference Point Dosage Given to Date: 34 Gy
Reference Point Session Dosage Given: 2 Gy
Session Number: 17

## 2021-09-14 ENCOUNTER — Ambulatory Visit
Admission: RE | Admit: 2021-09-14 | Discharge: 2021-09-14 | Disposition: A | Discharge status: Home / Self Care | Payer: 59 | Source: Ambulatory Visit | Attending: Radiation Oncology | Admitting: Radiation Oncology

## 2021-09-14 ENCOUNTER — Other Ambulatory Visit: Payer: Self-pay

## 2021-09-14 ENCOUNTER — Inpatient Hospital Stay: Payer: 59

## 2021-09-14 DIAGNOSIS — Z5111 Encounter for antineoplastic chemotherapy: Secondary | ICD-10-CM | POA: Diagnosis not present

## 2021-09-14 LAB — RAD ONC ARIA SESSION SUMMARY
Course Elapsed Days: 25
Plan Fractions Treated to Date: 18
Plan Prescribed Dose Per Fraction: 2 Gy
Plan Total Fractions Prescribed: 20
Plan Total Prescribed Dose: 40 Gy
Reference Point Dosage Given to Date: 36 Gy
Reference Point Session Dosage Given: 2 Gy
Session Number: 18

## 2021-09-15 ENCOUNTER — Encounter: Payer: Self-pay | Admitting: Internal Medicine

## 2021-09-15 ENCOUNTER — Ambulatory Visit: Payer: 59

## 2021-09-15 ENCOUNTER — Other Ambulatory Visit: Payer: Self-pay

## 2021-09-15 DIAGNOSIS — Z510 Encounter for antineoplastic radiation therapy: Secondary | ICD-10-CM | POA: Insufficient documentation

## 2021-09-15 DIAGNOSIS — Z79899 Other long term (current) drug therapy: Secondary | ICD-10-CM | POA: Diagnosis not present

## 2021-09-15 DIAGNOSIS — J449 Chronic obstructive pulmonary disease, unspecified: Secondary | ICD-10-CM | POA: Diagnosis not present

## 2021-09-15 DIAGNOSIS — Z7951 Long term (current) use of inhaled steroids: Secondary | ICD-10-CM | POA: Diagnosis not present

## 2021-09-15 DIAGNOSIS — C3432 Malignant neoplasm of lower lobe, left bronchus or lung: Secondary | ICD-10-CM | POA: Insufficient documentation

## 2021-09-15 DIAGNOSIS — Z5111 Encounter for antineoplastic chemotherapy: Secondary | ICD-10-CM | POA: Diagnosis present

## 2021-09-15 DIAGNOSIS — Z85850 Personal history of malignant neoplasm of thyroid: Secondary | ICD-10-CM | POA: Diagnosis not present

## 2021-09-15 LAB — RAD ONC ARIA SESSION SUMMARY
Course Elapsed Days: 26
Plan Fractions Treated to Date: 19
Plan Prescribed Dose Per Fraction: 2 Gy
Plan Total Fractions Prescribed: 20
Plan Total Prescribed Dose: 40 Gy
Reference Point Dosage Given to Date: 38 Gy
Reference Point Session Dosage Given: 2 Gy
Session Number: 19

## 2021-09-15 MED FILL — Dexamethasone Sodium Phosphate Inj 100 MG/10ML: INTRAMUSCULAR | Qty: 1 | Status: AC

## 2021-09-16 ENCOUNTER — Inpatient Hospital Stay: Payer: 59 | Attending: Internal Medicine

## 2021-09-16 ENCOUNTER — Inpatient Hospital Stay (HOSPITAL_BASED_OUTPATIENT_CLINIC_OR_DEPARTMENT_OTHER): Payer: 59 | Admitting: Internal Medicine

## 2021-09-16 ENCOUNTER — Inpatient Hospital Stay: Payer: 59

## 2021-09-16 ENCOUNTER — Ambulatory Visit: Payer: 59 | Admitting: Radiation Oncology

## 2021-09-16 ENCOUNTER — Ambulatory Visit
Admission: RE | Admit: 2021-09-16 | Discharge: 2021-09-16 | Disposition: A | Discharge status: Home / Self Care | Payer: 59 | Source: Ambulatory Visit | Attending: Radiation Oncology | Admitting: Radiation Oncology

## 2021-09-16 ENCOUNTER — Encounter: Payer: Self-pay | Admitting: Internal Medicine

## 2021-09-16 ENCOUNTER — Other Ambulatory Visit: Payer: Self-pay

## 2021-09-16 DIAGNOSIS — Z510 Encounter for antineoplastic radiation therapy: Secondary | ICD-10-CM | POA: Insufficient documentation

## 2021-09-16 DIAGNOSIS — Z5111 Encounter for antineoplastic chemotherapy: Secondary | ICD-10-CM | POA: Diagnosis not present

## 2021-09-16 DIAGNOSIS — J449 Chronic obstructive pulmonary disease, unspecified: Secondary | ICD-10-CM | POA: Insufficient documentation

## 2021-09-16 DIAGNOSIS — C3432 Malignant neoplasm of lower lobe, left bronchus or lung: Secondary | ICD-10-CM | POA: Diagnosis not present

## 2021-09-16 DIAGNOSIS — Z7951 Long term (current) use of inhaled steroids: Secondary | ICD-10-CM | POA: Insufficient documentation

## 2021-09-16 DIAGNOSIS — Z85850 Personal history of malignant neoplasm of thyroid: Secondary | ICD-10-CM | POA: Insufficient documentation

## 2021-09-16 DIAGNOSIS — Z79899 Other long term (current) drug therapy: Secondary | ICD-10-CM | POA: Insufficient documentation

## 2021-09-16 LAB — COMPREHENSIVE METABOLIC PANEL
ALT: 18 U/L (ref 0–44)
AST: 20 U/L (ref 15–41)
Albumin: 3.7 g/dL (ref 3.5–5.0)
Alkaline Phosphatase: 66 U/L (ref 38–126)
Anion gap: 8 (ref 5–15)
BUN: 17 mg/dL (ref 8–23)
CO2: 28 mmol/L (ref 22–32)
Calcium: 9.2 mg/dL (ref 8.9–10.3)
Chloride: 103 mmol/L (ref 98–111)
Creatinine, Ser: 0.88 mg/dL (ref 0.44–1.00)
GFR, Estimated: >60 mL/min (ref >60)
Glucose, Bld: 129 mg/dL — ABNORMAL HIGH (ref 70–99)
Potassium: 4 mmol/L (ref 3.5–5.1)
Sodium: 139 mmol/L (ref 135–145)
Total Bilirubin: 0.4 mg/dL (ref 0.3–1.2)
Total Protein: 7 g/dL (ref 6.5–8.1)

## 2021-09-16 LAB — CBC WITH DIFFERENTIAL/PLATELET
Abs Immature Granulocytes: 0.04 10*3/uL (ref 0.00–0.07)
Basophils Absolute: 0.1 10*3/uL (ref 0.0–0.1)
Basophils Relative: 1 %
Eosinophils Absolute: 0.3 10*3/uL (ref 0.0–0.5)
Eosinophils Relative: 3 %
HCT: 37.3 % (ref 36.0–46.0)
Hemoglobin: 11.9 g/dL — ABNORMAL LOW (ref 12.0–15.0)
Immature Granulocytes: 1 %
Lymphocytes Relative: 7 %
Lymphs Abs: 0.5 10*3/uL — ABNORMAL LOW (ref 0.7–4.0)
MCH: 32.2 pg (ref 26.0–34.0)
MCHC: 31.9 g/dL (ref 30.0–36.0)
MCV: 101.1 fL — ABNORMAL HIGH (ref 80.0–100.0)
Monocytes Absolute: 0.7 10*3/uL (ref 0.1–1.0)
Monocytes Relative: 10 %
Neutro Abs: 5.8 10*3/uL (ref 1.7–7.7)
Neutrophils Relative %: 78 %
Platelets: 211 10*3/uL (ref 150–400)
RBC: 3.69 MIL/uL — ABNORMAL LOW (ref 3.87–5.11)
RDW: 16.3 % — ABNORMAL HIGH (ref 11.5–15.5)
WBC: 7.3 10*3/uL (ref 4.0–10.5)
nRBC: 0 % (ref 0.0–0.2)

## 2021-09-16 LAB — RAD ONC ARIA SESSION SUMMARY
Course Elapsed Days: 27
Plan Fractions Treated to Date: 20
Plan Prescribed Dose Per Fraction: 2 Gy
Plan Total Fractions Prescribed: 20
Plan Total Prescribed Dose: 40 Gy
Reference Point Dosage Given to Date: 40 Gy
Reference Point Session Dosage Given: 2 Gy
Session Number: 20

## 2021-09-16 MED ORDER — CYANOCOBALAMIN 1000 MCG/ML IJ SOLN
1000.0000 ug | Freq: Once | INTRAMUSCULAR | Status: AC
  Administered 2021-09-16: 1000 ug via INTRAMUSCULAR
  Filled 2021-09-16: qty 1

## 2021-09-16 MED ORDER — SODIUM CHLORIDE 0.9 % IV SOLN
10.0000 mg | Freq: Once | INTRAVENOUS | Status: AC
  Administered 2021-09-16: 10 mg via INTRAVENOUS
  Filled 2021-09-16: qty 10

## 2021-09-16 MED ORDER — SODIUM CHLORIDE 0.9% FLUSH
10.0000 mL | Freq: Once | INTRAVENOUS | Status: AC
  Administered 2021-09-16: 10 mL via INTRAVENOUS
  Filled 2021-09-16: qty 10

## 2021-09-16 MED ORDER — BENZONATATE 100 MG PO CAPS
100.0000 mg | ORAL_CAPSULE | Freq: Three times a day (TID) | ORAL | 1 refills | Status: DC | PRN
Start: 2021-09-16 — End: 2022-02-09

## 2021-09-16 MED ORDER — PROCHLORPERAZINE MALEATE 10 MG PO TABS
10.0000 mg | ORAL_TABLET | Freq: Once | ORAL | Status: AC
  Administered 2021-09-16: 10 mg via ORAL
  Filled 2021-09-16: qty 1

## 2021-09-16 MED ORDER — ONDANSETRON HCL 4 MG/2ML IJ SOLN
8.0000 mg | Freq: Once | INTRAMUSCULAR | Status: AC
  Administered 2021-09-16: 8 mg via INTRAVENOUS
  Filled 2021-09-16: qty 4

## 2021-09-16 MED ORDER — SODIUM CHLORIDE 0.9 % IV SOLN
500.0000 mg/m2 | Freq: Once | INTRAVENOUS | Status: AC
  Administered 2021-09-16: 1000 mg via INTRAVENOUS
  Filled 2021-09-16: qty 40

## 2021-09-16 MED ORDER — HEPARIN SOD (PORK) LOCK FLUSH 100 UNIT/ML IV SOLN
500.0000 [IU] | Freq: Once | INTRAVENOUS | Status: DC | PRN
  Filled 2021-09-16: qty 5

## 2021-09-16 MED ORDER — SODIUM CHLORIDE 0.9 % IV SOLN: 8.0000 mg | Freq: Once | INTRAVENOUS | Status: DC

## 2021-09-16 MED ORDER — HEPARIN SOD (PORK) LOCK FLUSH 100 UNIT/ML IV SOLN
500.0000 [IU] | Freq: Once | INTRAVENOUS | Status: AC
  Administered 2021-09-16: 500 [IU] via INTRAVENOUS
  Filled 2021-09-16: qty 5

## 2021-09-16 MED ORDER — SODIUM CHLORIDE 0.9 % IV SOLN
Freq: Once | INTRAVENOUS | Status: AC
  Filled 2021-09-16: qty 250

## 2021-09-16 NOTE — Progress Notes (Signed)
[place] CONSULT NOTE  Patient Care Team: Dion Body, MD as PCP - General (Family Medicine) Leata Mouse (Inactive) Byrnett, Forest Gleason, MD (General Surgery) Telford Nab, RN as Oncology Nurse Navigator Cammie Sickle, MD as Consulting Physician (Oncology)  CHIEF COMPLAINTS/PURPOSE OF CONSULTATION: lung cancer  Oncology History Overview Note  IMPRESSION: Hypermetabolic solid pulmonary nodule of the left lower lobe, favor primary lung malignancy.   Numerous hypermetabolic nodules of the lower left pleura, compatible with pleural metastatic disease.   Hypermetabolic subcarinal and left hilar lymph nodes, compatible with metastatic disease.   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 Recommend attention on follow-up.   Mild asymmetric FDG uptake below mediastinal blood pool within a small nodule of the left parotid gland, favored to be benign. Recommend attention on follow-up.   No evidence of metastatic disease in the abdomen or pelvis.   No evidence of osseous metastatic disease.   ---------------------  # MARCH 2022- incidental LLL [ 0.9 cm] s/p COVID vaccine; [Dr.Aleskerov]; July 2022- left pleural pain-   #  #October 2022 MRI brain-punctate lesion-asymptomatic [D/w- Dr.Vaslow]; December 2022 MRI negative for any acute process; chronic sclerosis of hippocampus-clinically insignificant.  COPD-non-complaint    ------------------   DIAGNOSIS:  A. PLEURAL MASS, LEFT CHEST; BIOPSY:  - DIAGNOSTIC OF MALIGNANCY.  - NON-SMALL CELL CARCINOMA, FAVOR ADENOCARCINOMA.   Comment:  The tumor cells are positive for CK7 and TTF1 (patchy).  They are  negative for p40.   # 12/10/2020-carbo Alimta; cycle #1.    # JUNE 8th, 2023-  Enlarging left  lower lobe pulmonary nodule seen on recent CT scan is markedly hypermetabolic, consistent with neoplasm; Hypermetabolic nodal metastatic disease in the left hilum and central mediastinum. JUNE- 2023- RT 20 Fractions.   #NGS PD-L1 TPS 1%   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[place])  05/02/2015 Initial Diagnosis   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HCC)   Cancer of lower lobe of left lung ([place])  12/02/2020 Initial Diagnosis   Cancer of lower lobe of left lung ([place])   12/10/2020 Cancer Staging   Staging form: Lung, AJCC 8th Edition - Clinical: Stage IVA (cT4, cN3, cM1a) - Signed by Cammie Sickle, MD on 12/10/2020   12/10/2020 -  Chemotherapy   Patient is on Treatment Plan : LUNG CARBOplatin / Pemetrexed / Pembrolizumab q21d Induction x 4 cycles / Maintenance Pemetrexed + Pembrolizumab        HISTORY OF PRESENTING ILLNESS: Ambulating independently.  Alone.  Felicia Manning 66 y.o.  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   Patient currently undergoing radiation for increasing size of the left lower lobe pulm nodule; right hilar/mediastinal lymph nodes.  She is currently s awaiting to finish radiation on August 2nd today [split radiation].  [name] is on hold while she is getting radiation.  Complains of worsening cough.  Mild diarrhea- mild nausea currently improved with antiemetics.  Appetite is good.  No weight loss.  Review of Systems  Constitutional:  Positive for malaise/fatigue. Negative for chills, diaphoresis, fever and weight loss.  HENT:  Negative for nosebleeds and sore throat.   Eyes:  Negative for double vision.  Respiratory:  Positive for cough and sputum production. Negative for hemoptysis and shortness of  breath.   Cardiovascular:  Negative for palpitations, orthopnea and leg swelling.  Gastrointestinal:  Negative for  abdominal pain, blood in stool, constipation, diarrhea, heartburn, melena, nausea and vomiting.  Genitourinary:  Negative for dysuria, frequency and urgency.  Musculoskeletal:  Negative for back pain and joint pain.  Skin: Negative.  Negative for itching and rash.  Neurological:  Negative for dizziness, tingling, focal weakness and weakness.  Endo/Heme/Allergies:  Does not bruise/bleed easily.  Psychiatric/Behavioral:  Negative for depression. The patient is not nervous/anxious and does not have insomnia.      MEDICAL HISTORY:  Past Medical History:  Diagnosis Date   Barrett's esophagus    Dysrhythmia    stress related at work.   GERD (gastroesophageal reflux disease)    Hepatitis C 2008   Hyperlipidemia    Hypertension    Hypothyroidism    Nephrolithiasis    Non-small cell carcinoma of left lung (HCC)    Osteoporosis    Pulmonary mass    Thyroid cancer ([place]) 2012   Thyroid cancer ([place])    Tumor    tumor per pt back of rt eye    SURGICAL HISTORY: Past Surgical History:  Procedure Laterality Date   ABDOMINAL HYSTERECTOMY  2006   COLONOSCOPY WITH PROPOFOL N/A 04/25/2015   Procedure: COLONOSCOPY WITH PROPOFOL;  Surgeon: Josefine Class, MD;  Location: [facility] ENDOSCOPY;  Service: Endoscopy;  Laterality: N/A;   CYSTOSCOPY W/ RETROGRADES Bilateral 05/19/2015   Procedure: CYSTOSCOPY WITH RETROGRADE PYELOGRAM;  Surgeon: Hollice Espy, MD;  Location: [facility] ORS;  Service: Urology;  Laterality: Bilateral;   CYSTOSCOPY W/ URETERAL STENT PLACEMENT Right 05/26/2015   Procedure: CYSTOSCOPY WITH STENT REPLACEMENT;  Surgeon: Hollice Espy, MD;  Location: [facility] ORS;  Service: Urology;  Laterality: Right;   CYSTOSCOPY WITH STENT PLACEMENT Right 05/19/2015   Procedure: CYSTOSCOPY WITH STENT PLACEMENT;  Surgeon: Hollice Espy, MD;  Location: [facility] ORS;  Service: Urology;  Laterality: Right;   CYSTOSCOPY WITH STENT PLACEMENT Right 05/26/2015   Procedure: CYSTOSCOPY WITH STENT PLACEMENT;  Surgeon:  Hollice Espy, MD;  Location: [facility] ORS;  Service: Urology;  Laterality: Right;   CYSTOSCOPY WITH STENT PLACEMENT Right 06/25/2016   Procedure: CYSTOSCOPY WITH STENT PLACEMENT;  Surgeon: Nickie Retort, MD;  Location: [facility] ORS;  Service: Urology;  Laterality: Right;   CYSTOSCOPY WITH STENT PLACEMENT Left 10/31/2017   Procedure: CYSTOSCOPY WITH STENT PLACEMENT;  Surgeon: Hollice Espy, MD;  Location: [facility] ORS;  Service: Urology;  Laterality: Left;   CYSTOSCOPY/URETEROSCOPY/HOLMIUM LASER/STENT PLACEMENT Left 07/23/2015   Procedure: CYSTOSCOPY/URETEROSCOPY/HOLMIUM LASER/STENT PLACEMENT/RETROGRADE PYELOGRAM;  Surgeon: Hollice Espy, MD;  Location: [facility] ORS;  Service: Urology;  Laterality: Left;   CYSTOSCOPY/URETEROSCOPY/HOLMIUM LASER/STENT PLACEMENT Left 11/16/2017   Procedure: CYSTOSCOPY/URETEROSCOPY/HOLMIUM LASER/STENT Exchange;  Surgeon: Hollice Espy, MD;  Location: [facility] ORS;  Service: Urology;  Laterality: Left;   ESOPHAGOGASTRODUODENOSCOPY (EGD) WITH PROPOFOL N/A 04/25/2015   Procedure: ESOPHAGOGASTRODUODENOSCOPY (EGD) WITH PROPOFOL;  Surgeon: Josefine Class, MD;  Location: [facility] ENDOSCOPY;  Service: Endoscopy;  Laterality: N/A;   EYE SURGERY Bilateral 1964   lazy eye repair   EYE SURGERY Bilateral 2001   lazy eye repair   IR IMAGING GUIDED PORT INSERTION  12/08/2020   LITHOTRIPSY  2009   THYROIDECTOMY  2010   URETEROSCOPY WITH HOLMIUM LASER LITHOTRIPSY Right 05/19/2015   Procedure: URETEROSCOPY WITH HOLMIUM LASER LITHOTRIPSY;  Surgeon: Hollice Espy, MD;  Location: [facility] ORS;  Service: Urology;  Laterality: Right;   URETEROSCOPY WITH HOLMIUM LASER LITHOTRIPSY Right 05/26/2015   Procedure: URETEROSCOPY WITH HOLMIUM LASER LITHOTRIPSY;  Surgeon: Hollice Espy, MD;  Location: [facility] ORS;  Service: Urology;  Laterality: Right;   URETEROSCOPY WITH HOLMIUM LASER LITHOTRIPSY Right 06/25/2016   Procedure: URETEROSCOPY WITH HOLMIUM LASER LITHOTRIPSY;  Surgeon: Nickie Retort, MD;  Location:  [facility] ORS;  Service: Urology;  Laterality: Right;    SOCIAL HISTORY: Social History   Socioeconomic History   Marital status: Divorced    Spouse name: Not on file   Number of children: Not on file   Years of education: Not on file   Highest education level: Not on file  Occupational History   Not on file  Tobacco Use   Smoking status: Every Day    Packs/day: 1.00    Years: 40.00    Total pack years: 40.00    Types: Cigarettes   Smokeless tobacco: Never  Vaping Use   Vaping Use: Every day  Substance and Sexual Activity   Alcohol use: Not Currently    Comment: socially   Drug use: No   Sexual activity: Not Currently    Birth control/protection: None  Other Topics Concern   Not on file  Social History Narrative   Smoking: 1p 1-2 days since 17 years; alcohol: rare; 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     Social Determinants of Health   Financial Resource Strain: Not on file  Food Insecurity: Not on file  Transportation Needs: Unmet Transportation Needs (09/02/2021)   PRAPARE - Hydrologist (Medical): Yes    Lack of Transportation (Non-Medical): No  Physical Activity: Not on file  Stress: Not on file  Social Connections: Not on file  Intimate Partner Violence: Not on file    FAMILY HISTORY: Family History  Problem Relation Age of Onset   CAD Mother    Heart disease Mother    Dementia Father    Bladder Cancer Neg Hx    Prostate cancer Neg Hx    Kidney cancer Neg Hx    Breast cancer Neg Hx     ALLERGIES:  has No Known Allergies.  MEDICATIONS:  Current Outpatient Medications  Medication Sig Dispense Refill   Accu-Chek Softclix Lancets lancets Check glucose once a day 100 each 12   albuterol (VENTOLIN HFA) 108 (90 Base) MCG/ACT inhaler Inhale 2 puffs into the lungs every 6 (six) hours as needed for wheezing or shortness of breath. 8 g 2   amLODipine  (NORVASC) 5 MG tablet Take 5 mg by mouth every morning.      blood glucose meter kit and supplies KIT Dispense based on patient and insurance preference. Use up to three times daily as directed. 1 each 0   cholecalciferol (VITAMIN D) 1000 units tablet Take 2,000 Units by mouth daily.     Cranberry-Vitamin C-Probiotic (AZO CRANBERRY PO) Take 1 tablet by mouth daily.     fluticasone-salmeterol (ADVAIR HFA) 45-21 MCG/ACT inhaler Inhale 2 puffs into the lungs 2 (two) times daily. 1 each 12   folic acid (FOLVITE) 1 MG tablet Take 1 tablet (1 mg total) by mouth daily. 90 tablet 1   glipiZIDE (GLUCOTROL) 5 MG tablet Take 1 tablet (5 mg total) by mouth 2 (two) times daily before a meal. 180 tablet 1   levothyroxine (SYNTHROID) 175 MCG tablet Saturday and sunday     levothyroxine (SYNTHROID, LEVOTHROID) 150 MCG tablet Take 150 mcg by mouth at bedtime. Mon-friday     lidocaine-prilocaine (EMLA) cream Apply 1 application topically as needed. [place]  g 0   losartan (COZAAR) 100 MG tablet Take 100 mg by mouth every morning.      metoprolol succinate (TOPROL-XL) 25 MG 24 hr tablet Take 25 mg by mouth every morning.   1   montelukast (SINGULAIR) 10 MG tablet TAKE 1 TABLET BY MOUTH AT BEDTIME 30 tablet 0   omeprazole (PRILOSEC) 20 MG capsule Take 20 mg by mouth daily before breakfast.      ondansetron (ZOFRAN) 8 MG tablet Take 1 tablet (8 mg total) by mouth every 8 (eight) hours as needed for nausea or vomiting. Start on the third day after chemotherapy 30 tablet 2   prochlorperazine (COMPAZINE) 10 MG tablet Take 1 tablet (10 mg total) by mouth every 6 (six) hours as needed for nausea or vomiting. 30 tablet 2   benzonatate (TESSALON PERLES) 100 MG capsule Take 1 capsule (100 mg total) by mouth 3 (three) times daily as needed for cough. 90 capsule 1   HYDROcodone-acetaminophen (NORCO/VICODIN) 5-325 MG tablet Take 1 tablet by mouth every 8 (eight) hours as needed for moderate pain. (Patient not taking: Reported on  08/26/2021) 60 tablet 0   loperamide (IMODIUM) 2 MG capsule Take by mouth as needed for diarrhea or loose stools. (Patient not taking: Reported on 08/26/2021)     nystatin (MYCOSTATIN) 100000 UNIT/ML suspension Swish and swallow. Don't drink fluids immediately following administration. (Patient not taking: Reported on 06/24/2021) 400 mL 0   No current facility-administered medications for this visit.   Facility-Administered Medications Ordered in Other Visits  Medication Dose Route Frequency Provider Last Rate Last Admin   heparin lock flush 100 unit/mL  500 Units Intravenous Once Cammie Sickle, MD          .  PHYSICAL EXAMINATION: ECOG PERFORMANCE STATUS: 1 - Symptomatic but completely ambulatory  There were no vitals filed for this visit.   Filed Weights   09/16/21 0900  Weight: 187 lb 6.4 oz (85 kg)    No significant right upper quadrant tenderness noted.  Physical Exam Vitals and nursing note reviewed.  HENT:     Head: Normocephalic and atraumatic.     Mouth/Throat:     Pharynx: Oropharynx is clear.  Eyes:     Extraocular Movements: Extraocular movements intact.     Pupils: Pupils are equal, round, and reactive to light.  Cardiovascular:     Rate and Rhythm: Normal rate and regular rhythm.  Pulmonary:     Comments: Decreased breath sounds bilaterally.  Abdominal:     Palpations: Abdomen is soft.  Musculoskeletal:        General: Normal range of motion.     Cervical back: Normal range of motion.  Skin:    General: Skin is warm.  Neurological:     General: No focal deficit present.     Mental Status: She is alert and oriented to person, place, and time.  Psychiatric:        Behavior: Behavior normal.        Judgment: Judgment normal.      LABORATORY DATA:  I have reviewed the data as listed Lab Results  Component Value Date   WBC 7.3 09/16/2021   HGB 11.9 (L) 09/16/2021   HCT 37.3 09/16/2021   MCV 101.1 (H) 09/16/2021   PLT 211 09/16/2021    Recent Labs    08/05/21 0903 08/26/21 0846 09/16/21 0843  NA 138 140 139  K 3.6 3.7 4.0  CL 105 106 103  CO2 26 26 28  GLUCOSE 136* 130* 129*  BUN _0 CREATININE 0.82 0.69 0.88  CALCIUM 8.5* 8.7* 9.2  GFRNONAA >60 >60 >60  PROT 7.0 6.9 7.0  ALBUMIN 3.5 3.5 3.7  AST _1 ALT _2 ALKPHOS 71 71 66  BILITOT 0.5 0.4 0.4    RADIOGRAPHIC STUDIES: I have personally reviewed the radiological images as listed and agreed with the findings in the report. No results found.  ASSESSMENT & PLAN:   Cancer of lower lobe of left lung ([place]) #Stage IV -non-small cell favor adenocarcinoma; s/p carbo-alimta-keytruda. CUrrently on Alimta-keytruda maintrence MAY 30th, 2023- Interval increase in size of the 2.5 cm spiculated left lower lobe pulmonary nodule with a new solid 5 mm nodule adjacent to the spiculated mass as well as increased size of the subcarinal and hilar lymph nodes, findings which are concerning for disease progression.JUNE 8th, 2023-  Enlarging left lower lobe pulmonary nodule seen on recent CT scan is markedly hypermetabolic, consistent with neoplasm; Hypermetabolic nodal metastatic disease in the left hilum and central mediastinum.   #Proceed with  maintaintnance  Alimta ONLY today Labs today reviewed;mild anemia- Hb 11;   acceptable for treatment today. Continue to  [facility] for the next 2 cycles given the upcoming radiation.  Discussed with Dr. Donella Stade- currently on planned 20 fractions to the hilar/mediastinal lymph nodes/and the left lower lobe nodule.  #Left parotid incidental uptake - PET June 8th, 2023-stable tiny left parotid nodule with increased hypermetabolism in the interval. Metastatic disease not excluded although this may represent a tiny parotid neoplasm. STABLE.   # COPD [Dr.A]/allergies likely secondary to poorly controlled COPD. Continue using albuterol 3-4 times a day and also compliance with Advair.  continue Singulair.  STABLE  # Left  chest wall pain:-improved/resolved-using hydrocodone qhs-STABLE;   # Right eye vision changes-3.6 mm lesion noted in the right eye;s/p ophthalmology evaluation at [facility]. 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 JUlY 2023- s/p revaluation with [facility] opth end of month- STABLE.   # Diabetes-A1c 6.9 [AUG 2022]-Fatsing-129  Continue glipizide 5 mg BID STABLE;  #Cough secondary to radiation-called in a prescription for Tessalon Perles.  # Diarrhea-G-1-on immoiudm/ Oral mucositis- G-1-STABLE;   # Nausea- G-1 -Zofran Dex Compazine Zofran- [if worse would consider Aloxi]STABLE;   # Hypocalcemia: on Vit D BID [8.5].  JUNE  VitD levels-48.  STABLE;     # Mediport placement-s/p dye study c tPA-functioning. STABLE;   * B12 - on 8/2  HOLD -keytruda 6/21& 7/12 & 8/02; 8/23.   #DISPOSITION: # Alimta; B12 injection today # 3 week- MD- labs- cbc/cmp; ONLY Alimta; Dr.B                 All questions were answered. The patient knows to call the clinic with any problems, questions or concerns.       Cammie Sickle, MD 09/16/2021 9:30 AM

## 2021-09-16 NOTE — Progress Notes (Signed)
Patient has been taking Benzonatate daily to help with cough during radiation.  PCP does not want to continue prescribing so pt request Dr. B send in prescription.  Current rx is for 10 day supply and just got filled.    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  Evaluated at [facility] for 4 month f/u of tumor behind right eye.  Also would like to discuss possibility when she could have cataract surgery.

## 2021-09-16 NOTE — Assessment & Plan Note (Addendum)
#  Stage IV -non-small cell favor adenocarcinoma; s/p carbo-alimta-keytruda. CUrrently on Alimta-keytruda maintrence MAY 30th, 2023- Interval increase in size of the 2.5 cm spiculated left lower lobe pulmonary nodule with a new solid 5 mm nodule adjacent to the spiculated mass as well as increased size of the subcarinal and hilar lymph nodes, findings which are concerning for disease progression.JUNE 8th, 2023-  Enlarging left lower lobe pulmonary nodule seen on recent CT scan is markedly hypermetabolic, consistent with neoplasm; Hypermetabolic nodal metastatic disease in the left hilum and central mediastinum.   #Proceed with  maintaintnance  Alimta ONLY today Labs today reviewed;mild anemia- Hb 11;   acceptable for treatment today. Continue to  [facility] for the next 2 cycles given the upcoming radiation.  Discussed with Dr. Donella Stade- currently on planned 20 fractions to the hilar/mediastinal lymph nodes/and the left lower lobe nodule.  #Left parotid incidental uptake - PET June 8th, 2023-stable tiny left parotid nodule with increased hypermetabolism in the interval. Metastatic disease not excluded although this may represent a tiny parotid neoplasm. STABLE.   # COPD [Dr.A]/allergies likely secondary to poorly controlled COPD. Continue using albuterol 3-4 times a day and also compliance with Advair.  continue Singulair.  STABLE  # Left chest wall pain:-improved/resolved-using hydrocodone qhs-STABLE;   # Right eye vision changes-3.6 mm lesion noted in the right eye;s/p ophthalmology evaluation at [facility]. 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 JUlY 2023- s/p revaluation with [facility] opth end of month- STABLE.   # Diabetes-A1c 6.9 [AUG 2022]-Fatsing-129  Continue glipizide 5 mg BID STABLE;  #Cough secondary to radiation-called in a prescription for Tessalon Perles.  # Diarrhea-G-1-on immoiudm/ Oral  mucositis- G-1-STABLE;   # Nausea- G-1 -Zofran Dex Compazine Zofran- [if worse would consider Aloxi]STABLE;   # Hypocalcemia: on Vit D BID [8.5].  JUNE  VitD levels-48.  STABLE;     # Mediport placement-s/p dye study c tPA-functioning. STABLE;   * B12 - on 8/2  HOLD -keytruda 6/21& 7/12 & 8/02; 8/23.   #DISPOSITION: # Alimta; B12 injection today # 3 week- MD- labs- cbc/cmp; ONLY Alimta; Dr.B

## 2021-09-17 ENCOUNTER — Encounter: Payer: Self-pay | Admitting: Internal Medicine

## 2021-09-21 ENCOUNTER — Telehealth: Payer: 59 | Admitting: Hospice and Palliative Medicine

## 2021-09-22 ENCOUNTER — Inpatient Hospital Stay (HOSPITAL_BASED_OUTPATIENT_CLINIC_OR_DEPARTMENT_OTHER): Payer: 59 | Admitting: Hospice and Palliative Medicine

## 2021-09-22 ENCOUNTER — Encounter: Payer: Self-pay | Admitting: Internal Medicine

## 2021-09-22 DIAGNOSIS — C3432 Malignant neoplasm of lower lobe, left bronchus or lung: Secondary | ICD-10-CM

## 2021-09-22 DIAGNOSIS — Z515 Encounter for palliative care: Secondary | ICD-10-CM

## 2021-09-22 MED ORDER — SUCRALFATE 1 G PO TABS: 1.0000 g | ORAL_TABLET | Freq: Three times a day (TID) | ORAL | 2 refills | Status: DC

## 2021-09-22 NOTE — Progress Notes (Signed)
Virtual Visit via Telephone Note  I connected with Felicia Manning on 09/22/21 at 11:30 AM EDT by telephone and verified that I am speaking with the correct person using two identifiers.  Location: Patient: Home Provider: Clinic   I discussed the limitations, risks, security and privacy concerns of performing an evaluation and management service by telephone and the availability of in person appointments. I also discussed with the patient that there may be a patient responsible charge related to this service. The patient expressed understanding and agreed to proceed.   History of Present Illness: Felicia Manning is a 66 y.o. female with multiple medical problems including including COPD, diabetes, and stage IV non-small cell lung cancer, initially diagnosed October 2022, on chemotherapy/immunotherapy.  Patient was referred to palliative care to discuss goals and provide ongoing support for symptoms.   Observations/Objective: I called and spoke with patient by phone.    Patient reports that she has had significant fatigue with XRT.  She plans to speak with Dr. Baruch Gouty there is question additional treatments.  She has known history of mass but has had recent worsening cataract that is affecting vision and impacting her quality of life.  Patient pending eval by cataract surgeon. She is unsure how treatments might affect scheduling of this procedure.   Has esophageal pain with radiation. Will start Carafate.   Patient now living by herself as granddaughter moved [place]. Patient using cane/walker and shower chair. No falls.  Appreciate home palliative care assessment.  Assessment and Plan: Stage IV NSCLC - on XRT.   Radiation esophagitis - start Carafate. Has had some reduced oral intake. Weights stable. Referral to nutrition.   Follow Up Instructions: Telephone visit 1-2 months   I discussed the assessment and treatment plan with the patient. The patient was provided an  opportunity to ask questions and all were answered. The patient agreed with the plan and demonstrated an understanding of the instructions.   The patient was advised to call back or seek an in-person evaluation if the symptoms worsen or if the condition fails to improve as anticipated.  I provided 15 minutes of non-face-to-face time during this encounter.   Irean Hong, NP

## 2021-09-23 ENCOUNTER — Ambulatory Visit
Admission: RE | Admit: 2021-09-23 | Discharge: 2021-09-23 | Disposition: A | Discharge status: Home / Self Care | Payer: 59 | Source: Ambulatory Visit | Attending: Radiation Oncology | Admitting: Radiation Oncology

## 2021-09-23 VITALS — BP 124/80 | HR 78 | Temp 97.2°F | Resp 18 | Ht 66.0 in | Wt 187.0 lb

## 2021-09-23 DIAGNOSIS — C3432 Malignant neoplasm of lower lobe, left bronchus or lung: Secondary | ICD-10-CM | POA: Diagnosis present

## 2021-09-23 DIAGNOSIS — Z923 Personal history of irradiation: Secondary | ICD-10-CM | POA: Diagnosis not present

## 2021-09-23 NOTE — Progress Notes (Signed)
Radiation Oncology Follow up Note  Name: Felicia Manning   Date:   09/23/2021 MRN:  [id number] DOB: 08-24-1955    This 66 y.o. female presents to the clinic today for 1 week follow-up status post initial radiation therapy for adenocarcinoma of the lung and patient treated with Alimta and Keytruda.  REFERRING PROVIDER: Dion Body, MD  HPI: Patient is a 66 year old female now about 1 week having completed 88 Gray over 4 weeks to a left lower lobe non-small cell lung cancer favoring adenocarcinoma.  She had been on Alimta and Keytruda with excellent response in her chest.  Most recent CT scan showed interval increase in a 2.5 spiculated left lower lobe nodule with adjacent 5 mm nodule..  She had a PET scan showing enlarging left lower lobe pulmonary nodule as well as hypermetabolic nodal metastatic disease in the left hilum and central mediastinum.  We treated her with initial course of 40 Gray over 4 weeks which she tolerated well.  She is seen today for follow-up and is doing well specifically denies cough hemoptysis or chest tightness.  COMPLICATIONS OF TREATMENT: none  FOLLOW UP COMPLIANCE: keeps appointments   PHYSICAL EXAM:  BP 124/80   Pulse 78   Temp (!) 97.2 F (36.2 C)   Resp 18   Ht 5\' 6"  (1.676 m)   Wt 187 lb (84.8 kg)   BMI 30.18 kg/m  Well-developed well-nourished patient in NAD. HEENT reveals PERLA, EOMI, discs not visualized.  Oral cavity is clear. No oral mucosal lesions are identified. Neck is clear without evidence of cervical or supraclavicular adenopathy. Lungs are clear to A&P. Cardiac examination is essentially unremarkable with regular rate and rhythm without murmur rub or thrill. Abdomen is benign with no organomegaly or masses noted. Motor sensory and DTR levels are equal and symmetric in the upper and lower extremities. Cranial nerves II through XII are grossly intact. Proprioception is intact. No peripheral adenopathy or edema is identified. No motor or  sensory levels are noted. Crude visual fields are within normal range.  RADIOLOGY RESULTS: CT scan ordered  PLAN: Present time patient is doing well clinically with no significant side effect from her recent radiation therapy.  I like to rescan her for possible small field boost to her left lower lobe pulmonary nodule.  Should she have achieved an excellent response we will make a decision at that time whether further radiation therapy is noted needed.  I have set her up for simulation tomorrow and will evaluate her CT scan at that time.  Patient comprehends my recommendations well.  I would like to take this opportunity to thank you for allowing me to participate in the care of your patient.Noreene Filbert, MD

## 2021-09-24 ENCOUNTER — Ambulatory Visit
Admission: RE | Admit: 2021-09-24 | Discharge: 2021-09-24 | Disposition: A | Discharge status: Home / Self Care | Payer: 59 | Source: Ambulatory Visit | Attending: Radiation Oncology | Admitting: Radiation Oncology

## 2021-09-24 DIAGNOSIS — Z5111 Encounter for antineoplastic chemotherapy: Secondary | ICD-10-CM | POA: Diagnosis not present

## 2021-09-25 DIAGNOSIS — Z5111 Encounter for antineoplastic chemotherapy: Secondary | ICD-10-CM | POA: Diagnosis not present

## 2021-09-29 ENCOUNTER — Encounter: Payer: Self-pay | Admitting: Nurse Practitioner

## 2021-09-29 ENCOUNTER — Other Ambulatory Visit: Payer: Medicaid Other | Admitting: Nurse Practitioner

## 2021-09-29 ENCOUNTER — Encounter: Payer: Self-pay | Admitting: Internal Medicine

## 2021-09-29 DIAGNOSIS — Z515 Encounter for palliative care: Secondary | ICD-10-CM

## 2021-09-29 DIAGNOSIS — C3432 Malignant neoplasm of lower lobe, left bronchus or lung: Secondary | ICD-10-CM

## 2021-09-29 DIAGNOSIS — R053 Chronic cough: Secondary | ICD-10-CM

## 2021-09-29 NOTE — Progress Notes (Signed)
[facility] Palliative Care Consult Note Telephone: [phone]  Fax: [phone]    Date of encounter: 09/29/21 12:35 PM PATIENT NAME: Felicia Manning [address] [id number]   [phone] (home)  DOB: 1955/04/25 MRN: [id number] PRIMARY CARE PROVIDER:    Dion Body, MD,  [place] [address] [phone]  RESPONSIBLE PARTY:    Contact Information     Name Relation Home Work Mobile   Hern,Amber Daughter   [phone]      I met face to face with patient in home. Palliative Care was asked to follow this patient by consultation request of  Dion Body, MD to address advance care planning and complex medical decision making. This is a follow up visit.                                  ASSESSMENT AND PLAN / RECOMMENDATIONS:  Symptom Management/Plan: 1. Advance Care Planning; DNR; MOST form in vynca with wishes for limited treatment options, short term hospitalization, wishes are for antibiotics, IVF, no feeding tube; will need further LTC planning with family for goc with disease progression.    2. Goals of Care: Goals include to maximize quality of life and symptom management. Our advance care planning conversation included a discussion about:    The value and importance of advance care planning  Exploration of personal, cultural or spiritual beliefs that might influence medical decisions  Exploration of goals of care in the event of a sudden injury or illness  Identification and preparation of a healthcare agent  Review and updating or creation of an advance directive document.   3. Chronic intermit cough secondary to lung cancer with, tobacco abuse, discussed, educated   4. Palliative care encounter; Palliative care encounter; Palliative medicine team will continue to support patient, patient's family, and medical team. Visit consisted of counseling and education dealing  with the complex and emotionally intense issues of symptom management and palliative care in the setting of serious and potentially life-threatening illness  Follow up Palliative Care Visit: Palliative care will continue to follow for complex medical decision making, advance care planning, and clarification of goals. Return 12 weeks or prn.  I spent 63 minutes providing this consultation. More than 50% of the time in this consultation was spent in counseling and care coordination. PPS: 70% Chief Complaint: Follow up palliative consult for complex medical decision making, address goals, manage ongoing symptoms  HISTORY OF PRESENT ILLNESS:  Felicia Manning is a 66 y.o. 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 I called [name] to confirm pc visit, [name] in agreement. I visited [name] in her home, we sat at kitchen table for [facility] f/u visit. We talked about purpose of pc visit, ros including chronic intermit cough, education. We talked about recent radiation treatments, with repeat imaging, Dr Donella Stade recommended 10 more radiation treatments. We talked about gerd symptoms, carafate which [name] has not started as of yet. We talked about appetite, foods that would bother her Barrett's esophagus. We talked about nutrition, greek yogart. We talked about upcoming opthalmologic appointment for cataract and how  may overlap radiation treatments, she is waiting for next Ophthalmologist appointment. We talked about her concerns with her eyes, cataract and tumor behind right eye. We talked about her visit for tumor with Oncologist  Ophthalmologist. We talked about family dynamics at length concerning her 4 adult children, grandchildren. [name] endorses her granddaughter has moved back to [place] with her daughter. We talked about house work, functional adl's and [name] endorses she has hired a Chartered certified accountant which has helped. We talked about medical goals. We talked about coping strategies, quality of life, what brings her joy. We talked about  for [name] also follows J.Borders NP PC at [facility] in collaboration. We talked about role pc in poc. F/u PC visit scheduled. Most PC visit supportive counseling; Therapeutic listening, emotional support provided. Questions answered  History obtained from review of EMR, discussion with [name].  I reviewed available labs, medications, imaging, studies and related documents from the EMR.  Records reviewed and summarized above.   ROS 10 point system reviewed all negative except +cough chronic  Physical Exam: Constitutional: NAD General: pleasant female EYES:  lids intact ENMT: oral mucous membranes moist CV: S1S2, RRR Pulmonary: LCTA, no increased work of breathing, decrease bases Abdomen: soft and non tender MSK: ambulatory Skin: warm and dry Neuro:  no generalized weakness,  no cognitive impairment Psych: non-anxious affect, A and O x 3  Thank you for the opportunity to participate in the care of [name].  The palliative care team will continue to follow. Please call our office at [phone] if we can be of additional assistance.   Clarrissa Shimkus Ihor Gully, NP

## 2021-10-01 ENCOUNTER — Ambulatory Visit: Admission: RE | Admit: 2021-10-01 | Payer: 59 | Source: Ambulatory Visit

## 2021-10-01 DIAGNOSIS — Z5111 Encounter for antineoplastic chemotherapy: Secondary | ICD-10-CM | POA: Diagnosis not present

## 2021-10-02 ENCOUNTER — Encounter: Payer: Self-pay | Admitting: *Deleted

## 2021-10-05 ENCOUNTER — Inpatient Hospital Stay: Payer: 59

## 2021-10-05 ENCOUNTER — Other Ambulatory Visit: Payer: Self-pay

## 2021-10-05 ENCOUNTER — Ambulatory Visit
Admission: RE | Admit: 2021-10-05 | Discharge: 2021-10-05 | Disposition: A | Discharge status: Home / Self Care | Payer: 59 | Source: Ambulatory Visit | Attending: Radiation Oncology | Admitting: Radiation Oncology

## 2021-10-05 DIAGNOSIS — Z5111 Encounter for antineoplastic chemotherapy: Secondary | ICD-10-CM | POA: Diagnosis not present

## 2021-10-05 LAB — RAD ONC ARIA SESSION SUMMARY
Course Elapsed Days: 0
Plan Fractions Treated to Date: 1
Plan Prescribed Dose Per Fraction: 2 Gy
Plan Total Fractions Prescribed: 10
Plan Total Prescribed Dose: 20 Gy
Reference Point Dosage Given to Date: 2 Gy
Reference Point Session Dosage Given: 2 Gy
Session Number: 1

## 2021-10-06 ENCOUNTER — Other Ambulatory Visit: Payer: Self-pay

## 2021-10-06 ENCOUNTER — Ambulatory Visit
Admission: RE | Admit: 2021-10-06 | Discharge: 2021-10-06 | Disposition: A | Discharge status: Home / Self Care | Payer: 59 | Source: Ambulatory Visit | Attending: Radiation Oncology | Admitting: Radiation Oncology

## 2021-10-06 ENCOUNTER — Inpatient Hospital Stay: Payer: 59

## 2021-10-06 DIAGNOSIS — Z5111 Encounter for antineoplastic chemotherapy: Secondary | ICD-10-CM | POA: Diagnosis not present

## 2021-10-06 LAB — RAD ONC ARIA SESSION SUMMARY
Course Elapsed Days: 1
Plan Fractions Treated to Date: 2
Plan Prescribed Dose Per Fraction: 2 Gy
Plan Total Fractions Prescribed: 10
Plan Total Prescribed Dose: 20 Gy
Reference Point Dosage Given to Date: 4 Gy
Reference Point Session Dosage Given: 2 Gy
Session Number: 2

## 2021-10-06 MED FILL — Dexamethasone Sodium Phosphate Inj 100 MG/10ML: INTRAMUSCULAR | Qty: 1 | Status: AC

## 2021-10-07 ENCOUNTER — Inpatient Hospital Stay: Payer: 59

## 2021-10-07 ENCOUNTER — Inpatient Hospital Stay (HOSPITAL_BASED_OUTPATIENT_CLINIC_OR_DEPARTMENT_OTHER): Payer: 59 | Admitting: Internal Medicine

## 2021-10-07 ENCOUNTER — Encounter: Payer: Self-pay | Admitting: Internal Medicine

## 2021-10-07 ENCOUNTER — Ambulatory Visit
Admission: RE | Admit: 2021-10-07 | Discharge: 2021-10-07 | Disposition: A | Discharge status: Home / Self Care | Payer: 59 | Source: Ambulatory Visit | Attending: Radiation Oncology | Admitting: Radiation Oncology

## 2021-10-07 ENCOUNTER — Other Ambulatory Visit: Payer: Self-pay | Admitting: Internal Medicine

## 2021-10-07 ENCOUNTER — Other Ambulatory Visit: Payer: Self-pay

## 2021-10-07 DIAGNOSIS — C3432 Malignant neoplasm of lower lobe, left bronchus or lung: Secondary | ICD-10-CM

## 2021-10-07 DIAGNOSIS — Z5111 Encounter for antineoplastic chemotherapy: Secondary | ICD-10-CM | POA: Diagnosis not present

## 2021-10-07 LAB — RAD ONC ARIA SESSION SUMMARY
Course Elapsed Days: 2
Plan Fractions Treated to Date: 3
Plan Prescribed Dose Per Fraction: 2 Gy
Plan Total Fractions Prescribed: 10
Plan Total Prescribed Dose: 20 Gy
Reference Point Dosage Given to Date: 6 Gy
Reference Point Session Dosage Given: 2 Gy
Session Number: 3

## 2021-10-07 LAB — CBC WITH DIFFERENTIAL/PLATELET
Abs Immature Granulocytes: 0.02 10*3/uL (ref 0.00–0.07)
Basophils Absolute: 0 10*3/uL (ref 0.0–0.1)
Basophils Relative: 1 %
Eosinophils Absolute: 0.1 10*3/uL (ref 0.0–0.5)
Eosinophils Relative: 2 %
HCT: 35.4 % — ABNORMAL LOW (ref 36.0–46.0)
Hemoglobin: 11.2 g/dL — ABNORMAL LOW (ref 12.0–15.0)
Immature Granulocytes: 0 %
Lymphocytes Relative: 6 %
Lymphs Abs: 0.4 10*3/uL — ABNORMAL LOW (ref 0.7–4.0)
MCH: 32.3 pg (ref 26.0–34.0)
MCHC: 31.6 g/dL (ref 30.0–36.0)
MCV: 102 fL — ABNORMAL HIGH (ref 80.0–100.0)
Monocytes Absolute: 0.9 10*3/uL (ref 0.1–1.0)
Monocytes Relative: 11 %
Neutro Abs: 6.3 10*3/uL (ref 1.7–7.7)
Neutrophils Relative %: 80 %
Platelets: 225 10*3/uL (ref 150–400)
RBC: 3.47 MIL/uL — ABNORMAL LOW (ref 3.87–5.11)
RDW: 16.9 % — ABNORMAL HIGH (ref 11.5–15.5)
WBC: 7.8 10*3/uL (ref 4.0–10.5)
nRBC: 0 % (ref 0.0–0.2)

## 2021-10-07 LAB — COMPREHENSIVE METABOLIC PANEL
ALT: 25 U/L (ref 0–44)
AST: 24 U/L (ref 15–41)
Albumin: 3.5 g/dL (ref 3.5–5.0)
Alkaline Phosphatase: 59 U/L (ref 38–126)
Anion gap: 11 (ref 5–15)
BUN: 14 mg/dL (ref 8–23)
CO2: 22 mmol/L (ref 22–32)
Calcium: 8.5 mg/dL — ABNORMAL LOW (ref 8.9–10.3)
Chloride: 105 mmol/L (ref 98–111)
Creatinine, Ser: 0.83 mg/dL (ref 0.44–1.00)
GFR, Estimated: >60 mL/min (ref >60)
Glucose, Bld: 133 mg/dL — ABNORMAL HIGH (ref 70–99)
Potassium: 3.6 mmol/L (ref 3.5–5.1)
Sodium: 138 mmol/L (ref 135–145)
Total Bilirubin: 0.6 mg/dL (ref 0.3–1.2)
Total Protein: 6.7 g/dL (ref 6.5–8.1)

## 2021-10-07 LAB — TSH: TSH: 1.375 u[IU]/mL (ref 0.350–4.500)

## 2021-10-07 MED ORDER — PROCHLORPERAZINE EDISYLATE 10 MG/2ML IJ SOLN
10.0000 mg | Freq: Once | INTRAMUSCULAR | Status: AC
  Administered 2021-10-07: 10 mg via INTRAVENOUS
  Filled 2021-10-07: qty 2

## 2021-10-07 MED ORDER — CYANOCOBALAMIN 1000 MCG/ML IJ SOLN: 1000.0000 ug | Freq: Once | INTRAMUSCULAR | Status: DC

## 2021-10-07 MED ORDER — DEXAMETHASONE SODIUM PHOSPHATE 10 MG/ML IJ SOLN
4.0000 mg | Freq: Once | INTRAMUSCULAR | Status: AC
  Administered 2021-10-07: 4 mg via INTRAVENOUS
  Filled 2021-10-07: qty 1

## 2021-10-07 MED ORDER — SODIUM CHLORIDE 0.9% FLUSH
10.0000 mL | Freq: Once | INTRAVENOUS | Status: DC
  Filled 2021-10-07: qty 10

## 2021-10-07 MED ORDER — HEPARIN SOD (PORK) LOCK FLUSH 100 UNIT/ML IV SOLN
500.0000 [IU] | Freq: Once | INTRAVENOUS | Status: AC
  Administered 2021-10-07: 500 [IU] via INTRAVENOUS
  Filled 2021-10-07: qty 5

## 2021-10-07 MED ORDER — ONDANSETRON HCL 4 MG/2ML IJ SOLN
8.0000 mg | Freq: Once | INTRAMUSCULAR | Status: AC
  Administered 2021-10-07: 8 mg via INTRAVENOUS
  Filled 2021-10-07: qty 4

## 2021-10-07 MED ORDER — SODIUM CHLORIDE 0.9 % IV SOLN: 8.0000 mg | Freq: Once | INTRAVENOUS | Status: DC

## 2021-10-07 MED ORDER — SODIUM CHLORIDE 0.9 % IV SOLN: 4.0000 mg | Freq: Once | INTRAVENOUS | Status: DC

## 2021-10-07 MED ORDER — SODIUM CHLORIDE 0.9 % IV SOLN
Freq: Once | INTRAVENOUS | Status: AC
  Filled 2021-10-07: qty 250

## 2021-10-07 MED ORDER — SODIUM CHLORIDE 0.9 % IV SOLN
500.0000 mg/m2 | Freq: Once | INTRAVENOUS | Status: AC
  Administered 2021-10-07: 1000 mg via INTRAVENOUS
  Filled 2021-10-07: qty 40

## 2021-10-07 NOTE — Patient Instructions (Signed)
[facility]  Discharge Instructions: Thank you for choosing [facility] to provide your oncology and hematology care.  If you have a lab appointment with the Cancer Center, please go directly to the Cancer Center and check in at the registration area.  Wear comfortable clothing and clothing appropriate for easy access to any Portacath or PICC line.   We strive to give you quality time with your provider. You may need to reschedule your appointment if you arrive late (15 or more minutes).  Arriving late affects you and other patients whose appointments are after yours.  Also, if you miss three or more appointments without notifying the office, you may be dismissed from the clinic at the provider's discretion.      For prescription refill requests, have your pharmacy contact our office and allow 72 hours for refills to be completed.    Today you received the following chemotherapy and/or immunotherapy agents: Alimta      To help prevent nausea and vomiting after your treatment, we encourage you to take your nausea medication as directed.  BELOW ARE SYMPTOMS THAT SHOULD BE REPORTED IMMEDIATELY: *FEVER GREATER THAN 100.4 F (38 C) OR HIGHER *CHILLS OR SWEATING *NAUSEA AND VOMITING THAT IS NOT CONTROLLED WITH YOUR NAUSEA MEDICATION *UNUSUAL SHORTNESS OF BREATH *UNUSUAL BRUISING OR BLEEDING *URINARY PROBLEMS (pain or burning when urinating, or frequent urination) *BOWEL PROBLEMS (unusual diarrhea, constipation, pain near the anus) TENDERNESS IN MOUTH AND THROAT WITH OR WITHOUT PRESENCE OF ULCERS (sore throat, sores in mouth, or a toothache) UNUSUAL RASH, SWELLING OR PAIN  UNUSUAL VAGINAL DISCHARGE OR ITCHING   Items with * indicate a potential emergency and should be followed up as soon as possible or go to the Emergency Department if any problems should occur.  Please show the CHEMOTHERAPY ALERT CARD or IMMUNOTHERAPY ALERT CARD at check-in to the  Emergency Department and triage nurse.  Should you have questions after your visit or need to cancel or reschedule your appointment, please contact [facility]  [phone] and follow the prompts.  Office hours are 8:00 a.m. to 4:30 p.m. Monday - Friday. Please note that voicemails left after 4:00 p.m. may not be returned until the following business day.  We are closed weekends and major holidays. You have access to a nurse at all times for urgent questions. Please call the main number to the clinic [phone] and follow the prompts.  For any non-urgent questions, you may also contact your provider using MyChart. We now offer e-Visits for anyone 18 and older to request care online for non-urgent symptoms. For details visit [email or web address].   Also download the MyChart app! Go to the app store, search "MyChart", open the app, select [facility], and log in with your MyChart username and password.  Masks are optional in the cancer centers. If you would like for your care team to wear a mask while they are taking care of you, please let them know. For doctor visits, patients may have with them one support person who is at least 64 years old. At this time, visitors are not allowed in the infusion area.   

## 2021-10-07 NOTE — Progress Notes (Signed)
DISCONTINUE ON PATHWAY REGIMEN - Non-Small Cell Lung     A cycle is every 21 days:     Pemetrexed      Carboplatin   **Always confirm dose/schedule in your pharmacy ordering system**  REASON: Other Reason PRIOR TREATMENT: [id number]: Carboplatin AUC=5 + Pemetrexed 500 mg/m2 q21 Days x 1 Cycle TREATMENT RESPONSE: Stable Disease (SD)  START ON PATHWAY REGIMEN - Non-Small Cell Lung     A cycle is every 21 days:     Pembrolizumab      Pemetrexed      Carboplatin   **Always confirm dose/schedule in your pharmacy ordering system**  Patient Characteristics: Stage IV Metastatic, Nonsquamous, Molecular Analysis Completed, Molecular Alteration Present and Targeted Therapy Exhausted OR EGFR Exon 20+ or KRAS G12C+ or HER2+ Present and No Prior Chemo/Immunotherapy OR No Alteration Present, Initial  Chemotherapy/Immunotherapy, PS = 0, 1, No Alteration Present, No Alteration Present, Candidate for Immunotherapy, PD-L1 Expression Positive 1-49% (TPS) / Negative / Not Tested / Awaiting Test Results and Immunotherapy Candidate Therapeutic Status: Stage IV Metastatic Histology: Nonsquamous Cell Broad Molecular Profiling Status: Molecular Analysis Completed Molecular Analysis Results: No Alteration Present ECOG Performance Status: 1 Chemotherapy/Immunotherapy Line of Therapy: Initial Chemotherapy/Immunotherapy EGFR Exons 18-21 Mutation Testing Status: Completed and Negative ALK Fusion/Rearrangement Testing Status: Completed and Negative BRAF V600 Mutation Testing Status: Completed and Negative KRAS G12C Mutation Testing Status: Completed and Negative MET Exon 14 Mutation Testing Status: Completed and Negative RET Fusion/Rearrangement Testing Status: Completed and Negative HER2 Mutation Testing Status: Completed and Negative NTRK Fusion/Rearrangement Testing Status: Completed and Negative ROS1 Fusion/Rearrangement Testing Status: Completed and Negative Immunotherapy Candidate Status: Candidate for  Immunotherapy PD-L1 Expression Status: Awaiting Test Results Intent of Therapy: Non-Curative / Palliative Intent, Discussed with Patient 

## 2021-10-07 NOTE — Assessment & Plan Note (Addendum)
#  Stage IV -non-small cell favor adenocarcinoma; s/p carbo-alimta-keytruda. CUrrently on Alimta-keytruda maintrence MAY 30th, 2023- Interval increase in size of the 2.5 cm spiculated left lower lobe pulmonary nodule with a new solid 5 mm nodule adjacent to the spiculated mass as well as increased size of the subcarinal and hilar lymph nodes, findings which are concerning for disease progression.JUNE 8th, 2023-  Enlarging left lower lobe pulmonary nodule seen on recent CT scan is markedly hypermetabolic, consistent with neoplasm; Hypermetabolic nodal metastatic disease in the left hilum and central mediastinum.    # Proceed with  maintaintnance  Alimta ONLY today Labs today reviewed;mild anemia- Hb 11;   acceptable for treatment today. Continue to hold  Keytruda while on radiation. Last RT planned on 10/21/2021.  We will plan imaging about 2 months or so after radiation.  #Left parotid incidental uptake - PET June 8th, 2023-stable tiny left parotid nodule with increased hypermetabolism in the interval. Metastatic disease not excluded although this may represent a tiny parotid neoplasm. STABLE.   # COPD [Dr.A]/allergies likely secondary to poorly controlled COPD. Continue using albuterol 3-4 times a day and also compliance with Advair.  continue Singulair.  STABLE  # Left chest wall pain:-improved/resolved-using hydrocodone qhs-STABLE;   # Right eye vision changes-3.6 mm lesion noted in the right eye;s/p ophthalmology evaluation at [facility]. 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 JUlY 2023- s/p revaluation with [facility] opth end of month- STABLE.  Patient will reach out to [place] for clearance for cataract surgery.  # Diabetes-A1c 6.9 [AUG 2022]-Fatsing-131 Continue glipizide 5 mg BID STABLE;    #Cough secondary to radiation-currently on Tessalon Perles.STABLE;    # Diarrhea-G-1-on immoiudm/ Oral mucositis-  G-1-STABLE;    # Hypocalcemia: on Vit D BID [8.5].  JUNE  VitD levels-48.  STABLE;     # Mediport placement-s/p dye study c tPA-functioning. STABLE;    * B12 - on 8/2  HOLD -keytruda 6/21& 7/12 & 8/02; 8/23.   #DISPOSITION: # Alimta today # follow up in  3 week- MD- labs- cbc/cmp; Keytruda- Alimta; Dr.B

## 2021-10-07 NOTE — Progress Notes (Signed)
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  Patient with lung cancer, stage IV.  Patient receiving keytruda and alimta.  Met with patient during infusion.  Patient reports that she could not take the carafate but finds that yogurt coats her esophagus.  Has been eating softer foods (hard boiled eggs).  Has oral nutrition supplements on hand but wants to eat solid foods first before drinking them.      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   INTERVENTION:  Discussed soft, moist foods for ease of swallowing.  Handout given Encouraged oral nutrition supplements for added calories and protein     MONITORING, EVALUATION, GOAL: weight trends, intake   NEXT VISIT: Wednesday, Sept 13 during infusion  Harbor Vanover B. Zenia Resides, [place], [place] Registered Dietitian [phone]

## 2021-10-07 NOTE — Progress Notes (Signed)
[facility] CONSULT NOTE  Patient Care Team: Dion Body, MD as PCP - General (Family Medicine) Leata Mouse (Inactive) Byrnett, Forest Gleason, MD (General Surgery) Telford Nab, RN as Oncology Nurse Navigator Cammie Sickle, MD as Consulting Physician (Oncology)  CHIEF COMPLAINTS/PURPOSE OF CONSULTATION: lung cancer  Oncology History Overview Note  IMPRESSION: Hypermetabolic solid pulmonary nodule of the left lower lobe, favor primary lung malignancy.   Numerous hypermetabolic nodules of the lower left pleura, compatible with pleural metastatic disease.   Hypermetabolic subcarinal and left hilar lymph nodes, compatible with metastatic disease.   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 Recommend attention on follow-up.   Mild asymmetric FDG uptake below mediastinal blood pool within a small nodule of the left parotid gland, favored to be benign. Recommend attention on follow-up.   No evidence of metastatic disease in the abdomen or pelvis.   No evidence of osseous metastatic disease.   ---------------------  # MARCH 2022- incidental LLL [ 0.9 cm] s/p COVID vaccine; [Dr.Aleskerov]; July 2022- left pleural pain-   #  #October 2022 MRI brain-punctate lesion-asymptomatic [D/w- Dr.Vaslow]; December 2022 MRI negative for any acute process; chronic sclerosis of hippocampus-clinically insignificant.  COPD-non-complaint    ------------------   DIAGNOSIS:  A. PLEURAL MASS, LEFT CHEST; BIOPSY:  - DIAGNOSTIC OF MALIGNANCY.  - NON-SMALL CELL CARCINOMA, FAVOR ADENOCARCINOMA.   Comment:  The tumor cells are positive for CK7 and TTF1 (patchy).  They are  negative for p40.   # 12/10/2020-carbo Alimta; cycle #1.    # JUNE 8th, 2023-  Enlarging left  lower lobe pulmonary nodule seen on recent CT scan is markedly hypermetabolic, consistent with neoplasm; Hypermetabolic nodal metastatic disease in the left hilum and central mediastinum. JUNE- 2023- RT 20 Fractions.   #NGS PD-L1 TPS 1%   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[place])  05/02/2015 Initial Diagnosis   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HCC)   Cancer of lower lobe of left lung ([place])  12/02/2020 Initial Diagnosis   Cancer of lower lobe of left lung ([place])   12/10/2020 Cancer Staging   Staging form: Lung, AJCC 8th Edition - Clinical: Stage IVA (cT4, cN3, cM1a) - Signed by Cammie Sickle, MD on 12/10/2020   12/10/2020 - 09/16/2021 Chemotherapy   Patient is on Treatment Plan : LUNG CARBOplatin / Pemetrexed / Pembrolizumab q21d Induction x 4 cycles / Maintenance Pemetrexed + Pembrolizumab     12/10/2020 -  Chemotherapy   Patient is on Treatment Plan : LUNG Carboplatin (5) + Pemetrexed (500) + Pembrolizumab (200) D1 q21d Induction x 4 cycles / Maintenance Pemetrexed (500) + Pembrolizumab (200) D1 q21d        HISTORY OF PRESENTING ILLNESS: Ambulating independently.  Alone.  Felicia Manning 66 y.o.  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   Patient currently undergoing radiation for increasing size of the left lower lobe pulm nodule; right hilar/mediastinal lymph nodes.  She is currently s awaiting to finish radiation on SEP, 6th [split radiation].  [name] is on hold while she is getting radiation.  Patient's cough improved on Tessalon Perles..  Mild diarrhea- mild nausea currently improved with antiemetics.  Appetite is good.  No weight loss.  Patient is considering cataract surgery.   Review  of Systems  Constitutional:  Positive for malaise/fatigue. Negative for chills, diaphoresis, fever and weight loss.  HENT:  Negative for  nosebleeds and sore throat.   Eyes:  Negative for double vision.  Respiratory:  Positive for cough and sputum production. Negative for hemoptysis and shortness of breath.   Cardiovascular:  Negative for palpitations, orthopnea and leg swelling.  Gastrointestinal:  Negative for abdominal pain, blood in stool, constipation, diarrhea, heartburn, melena, nausea and vomiting.  Genitourinary:  Negative for dysuria, frequency and urgency.  Musculoskeletal:  Negative for back pain and joint pain.  Skin: Negative.  Negative for itching and rash.  Neurological:  Negative for dizziness, tingling, focal weakness and weakness.  Endo/Heme/Allergies:  Does not bruise/bleed easily.  Psychiatric/Behavioral:  Negative for depression. The patient is not nervous/anxious and does not have insomnia.      MEDICAL HISTORY:  Past Medical History:  Diagnosis Date  . Barrett's esophagus   . Dysrhythmia    stress related at work.  . GERD (gastroesophageal reflux disease)   . Hepatitis C 2008  . Hyperlipidemia   . Hypertension   . Hypothyroidism   . Nephrolithiasis   . Non-small cell carcinoma of left lung (HCC)   . Osteoporosis   . Pulmonary mass   . Thyroid cancer (HCC) 2012  . Thyroid cancer (HCC)   . Tumor    tumor per pt back of rt eye    SURGICAL HISTORY: Past Surgical History:  Procedure Laterality Date  . ABDOMINAL HYSTERECTOMY  2006  . COLONOSCOPY WITH PROPOFOL N/A 04/25/2015   Procedure: COLONOSCOPY WITH PROPOFOL;  Surgeon: Matthew Gordon Rein, MD;  Location: [facility];  Service: Endoscopy;  Laterality: N/A;  . CYSTOSCOPY W/ RETROGRADES Bilateral 05/19/2015   Procedure: CYSTOSCOPY WITH RETROGRADE PYELOGRAM;  Surgeon: Ashley Brandon, MD;  Location: [facility];  Service: Urology;  Laterality: Bilateral;  . CYSTOSCOPY W/ URETERAL STENT PLACEMENT Right 05/26/2015   Procedure: CYSTOSCOPY WITH STENT REPLACEMENT;  Surgeon: Ashley Brandon, MD;  Location: [facility];  Service: Urology;  Laterality:  Right;  . CYSTOSCOPY WITH STENT PLACEMENT Right 05/19/2015   Procedure: CYSTOSCOPY WITH STENT PLACEMENT;  Surgeon: Ashley Brandon, MD;  Location: [facility];  Service: Urology;  Laterality: Right;  . CYSTOSCOPY WITH STENT PLACEMENT Right 05/26/2015   Procedure: CYSTOSCOPY WITH STENT PLACEMENT;  Surgeon: Ashley Brandon, MD;  Location: [facility];  Service: Urology;  Laterality: Right;  . CYSTOSCOPY WITH STENT PLACEMENT Right 06/25/2016   Procedure: CYSTOSCOPY WITH STENT PLACEMENT;  Surgeon: Budzyn, Brian James, MD;  Location: [facility];  Service: Urology;  Laterality: Right;  . CYSTOSCOPY WITH STENT PLACEMENT Left 10/31/2017   Procedure: CYSTOSCOPY WITH STENT PLACEMENT;  Surgeon: Brandon, Ashley, MD;  Location: [facility];  Service: Urology;  Laterality: Left;  . CYSTOSCOPY/URETEROSCOPY/HOLMIUM LASER/STENT PLACEMENT Left 07/23/2015   Procedure: CYSTOSCOPY/URETEROSCOPY/HOLMIUM LASER/STENT PLACEMENT/RETROGRADE PYELOGRAM;  Surgeon: Ashley Brandon, MD;  Location: [facility];  Service: Urology;  Laterality: Left;  . CYSTOSCOPY/URETEROSCOPY/HOLMIUM LASER/STENT PLACEMENT Left 11/16/2017   Procedure: CYSTOSCOPY/URETEROSCOPY/HOLMIUM LASER/STENT Exchange;  Surgeon: Brandon, Ashley, MD;  Location: [facility];  Service: Urology;  Laterality: Left;  . ESOPHAGOGASTRODUODENOSCOPY (EGD) WITH PROPOFOL N/A 04/25/2015   Procedure: ESOPHAGOGASTRODUODENOSCOPY (EGD) WITH PROPOFOL;  Surgeon: Matthew Gordon Rein, MD;  Location: [facility];  Service: Endoscopy;  Laterality: N/A;  . EYE SURGERY Bilateral 1964   lazy eye repair  . EYE SURGERY Bilateral 2001   lazy eye repair  . IR IMAGING GUIDED PORT INSERTION  12/08/2020  . LITHOTRIPSY  2009  . THYROIDECTOMY  2010  .   URETEROSCOPY WITH HOLMIUM LASER LITHOTRIPSY Right 05/19/2015   Procedure: URETEROSCOPY WITH HOLMIUM LASER LITHOTRIPSY;  Surgeon: Ashley Brandon, MD;  Location: [facility];  Service: Urology;  Laterality: Right;  . URETEROSCOPY WITH HOLMIUM LASER LITHOTRIPSY Right 05/26/2015    Procedure: URETEROSCOPY WITH HOLMIUM LASER LITHOTRIPSY;  Surgeon: Ashley Brandon, MD;  Location: [facility];  Service: Urology;  Laterality: Right;  . URETEROSCOPY WITH HOLMIUM LASER LITHOTRIPSY Right 06/25/2016   Procedure: URETEROSCOPY WITH HOLMIUM LASER LITHOTRIPSY;  Surgeon: Budzyn, Brian James, MD;  Location: [facility];  Service: Urology;  Laterality: Right;    SOCIAL HISTORY: Social History   Socioeconomic History  . Marital status: Divorced    Spouse name: Not on file  . Number of children: Not on file  . Years of education: Not on file  . Highest education level: Not on file  Occupational History  . Not on file  Tobacco Use  . Smoking status: Every Day    Packs/day: 1.00    Years: 40.00    Total pack years: 40.00    Types: Cigarettes  . Smokeless tobacco: Never  Vaping Use  . Vaping Use: Every day  Substance and Sexual Activity  . Alcohol use: Not Currently    Comment: socially  . Drug use: No  . Sexual activity: Not Currently    Birth control/protection: None  Other Topics Concern  . 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     Social Determinants of Health   Financial Resource Strain: Not on file  Food Insecurity: Not on file  Transportation Needs: Unmet Transportation Needs (10/02/2021)   PRAPARE - Transportation   . Lack of Transportation (Medical): Yes   . Lack of Transportation (Non-Medical): Yes  Physical Activity: Not on file  Stress: Not on file  Social Connections: Not on file  Intimate Partner Violence: Not on file    FAMILY HISTORY: Family History  Problem Relation Age of Onset  . CAD Mother   . Heart disease Mother   . Dementia Father   . Bladder Cancer Neg Hx   . Prostate cancer Neg Hx   . Kidney cancer Neg Hx   . Breast cancer Neg Hx     ALLERGIES:  has No Known  Allergies.  MEDICATIONS:  Current Outpatient Medications  Medication Sig Dispense Refill  . Accu-Chek Softclix Lancets lancets Check glucose once a day 100 each 12  . albuterol (VENTOLIN HFA) 108 (90 Base) MCG/ACT inhaler Inhale 2 puffs into the lungs every 6 (six) hours as needed for wheezing or shortness of breath. 8 g 2  . amLODipine (NORVASC) 5 MG tablet Take 5 mg by mouth every morning.     . benzonatate (TESSALON PERLES) 100 MG capsule Take 1 capsule (100 mg total) by mouth 3 (three) times daily as needed for cough. 90 capsule 1  . blood glucose meter kit and supplies KIT Dispense based on patient and insurance preference. Use up to three times daily as directed. 1 each 0  . cholecalciferol (VITAMIN D) 1000 units tablet Take 2,000 Units by mouth daily.    . Cranberry-Vitamin C-Probiotic (AZO CRANBERRY PO) Take 1 tablet by mouth daily.    . fluticasone-salmeterol (ADVAIR HFA) 45-21 MCG/ACT inhaler Inhale 2 puffs into the lungs 2 (two) times daily. 1 each 12  . folic acid (FOLVITE) 1 MG tablet Take 1 tablet (1   mg total) by mouth daily. 90 tablet 1  . glipiZIDE (GLUCOTROL) 5 MG tablet Take 1 tablet (5 mg total) by mouth 2 (two) times daily before a meal. 180 tablet 1  . HYDROcodone-acetaminophen (NORCO/VICODIN) 5-325 MG tablet Take 1 tablet by mouth every 8 (eight) hours as needed for moderate pain. 60 tablet 0  . levothyroxine (SYNTHROID) 175 MCG tablet Saturday and sunday    . levothyroxine (SYNTHROID, LEVOTHROID) 150 MCG tablet Take 150 mcg by mouth at bedtime. Mon-friday    . lidocaine-prilocaine (EMLA) cream Apply 1 application topically as needed. 30 g 0  . loperamide (IMODIUM) 2 MG capsule Take by mouth as needed for diarrhea or loose stools.    . losartan (COZAAR) 100 MG tablet Take 100 mg by mouth every morning.     . metoprolol succinate (TOPROL-XL) 25 MG 24 hr tablet Take 25 mg by mouth every morning.   1  . montelukast (SINGULAIR) 10 MG tablet TAKE 1 TABLET BY MOUTH AT BEDTIME 30  tablet 0  . omeprazole (PRILOSEC) 20 MG capsule Take 20 mg by mouth daily before breakfast.     . ondansetron (ZOFRAN) 8 MG tablet Take 1 tablet (8 mg total) by mouth every 8 (eight) hours as needed for nausea or vomiting. Start on the third day after chemotherapy 30 tablet 2  . prochlorperazine (COMPAZINE) 10 MG tablet Take 1 tablet (10 mg total) by mouth every 6 (six) hours as needed for nausea or vomiting. 30 tablet 2  . sucralfate (CARAFATE) 1 g tablet Take 1 tablet (1 g total) by mouth 4 (four) times daily -  with meals and at bedtime. 60 tablet 2  . nystatin (MYCOSTATIN) 100000 UNIT/ML suspension Swish and swallow. Don't drink fluids immediately following administration. (Patient not taking: Reported on 06/24/2021) 400 mL 0   No current facility-administered medications for this visit.   Facility-Administered Medications Ordered in Other Visits  Medication Dose Route Frequency Provider Last Rate Last Admin  . heparin lock flush 100 unit/mL  500 Units Intravenous Once Brahmanday, Govinda R, MD      . sodium chloride flush (NS) 0.9 % injection 10 mL  10 mL Intravenous Once Brahmanday, Govinda R, MD          .  PHYSICAL EXAMINATION: ECOG PERFORMANCE STATUS: 1 - Symptomatic but completely ambulatory  Vitals:   10/07/21 0850  BP: 117/70  Pulse: 72  Temp: 98.3 F (36.8 C)  SpO2: 99%     Filed Weights   10/07/21 0850  Weight: 186 lb 6.4 oz (84.6 kg)    No significant right upper quadrant tenderness noted.  Physical Exam Vitals and nursing note reviewed.  HENT:     Head: Normocephalic and atraumatic.     Mouth/Throat:     Pharynx: Oropharynx is clear.  Eyes:     Extraocular Movements: Extraocular movements intact.     Pupils: Pupils are equal, round, and reactive to light.  Cardiovascular:     Rate and Rhythm: Normal rate and regular rhythm.  Pulmonary:     Comments: Decreased breath sounds bilaterally.  Abdominal:     Palpations: Abdomen is soft.  Musculoskeletal:         General: Normal range of motion.     Cervical back: Normal range of motion.  Skin:    General: Skin is warm.  Neurological:     General: No focal deficit present.     Mental Status: She is alert and oriented to person, place, and time.    Psychiatric:        Behavior: Behavior normal.        Judgment: Judgment normal.     LABORATORY DATA:  I have reviewed the data as listed Lab Results  Component Value Date   WBC 7.8 10/07/2021   HGB 11.2 (L) 10/07/2021   HCT 35.4 (L) 10/07/2021   MCV 102.0 (H) 10/07/2021   PLT 225 10/07/2021   Recent Labs    08/26/21 0846 09/16/21 0843 10/07/21 0847  NA 140 139 138  K 3.7 4.0 3.6  CL 106 103 105  CO2 26 28 22  GLUCOSE 130* 129* 133*  BUN 13 17 14  CREATININE 0.69 0.88 0.83  CALCIUM 8.7* 9.2 8.5*  GFRNONAA >60 >60 >60  PROT 6.9 7.0 6.7  ALBUMIN 3.5 3.7 3.5  AST 22 20 24  ALT 23 18 25  ALKPHOS 71 66 59  BILITOT 0.4 0.4 0.6    RADIOGRAPHIC STUDIES: I have personally reviewed the radiological images as listed and agreed with the findings in the report. No results found.  ASSESSMENT & PLAN:   Cancer of lower lobe of left lung (HCC) #Stage IV -non-small cell favor adenocarcinoma; s/p carbo-alimta-keytruda. CUrrently on Alimta-keytruda maintrence MAY 30th, 2023- Interval increase in size of the 2.5 cm spiculated left lower lobe pulmonary nodule with a new solid 5 mm nodule adjacent to the spiculated mass as well as increased size of the subcarinal and hilar lymph nodes, findings which are concerning for disease progression.JUNE 8th, 2023-  Enlarging left lower lobe pulmonary nodule seen on recent CT scan is markedly hypermetabolic, consistent with neoplasm; Hypermetabolic nodal metastatic disease in the left hilum and central mediastinum.    # Proceed with  maintaintnance  Alimta ONLY today Labs today reviewed;mild anemia- Hb 11;   acceptable for treatment today. Continue to hold  Keytruda while on radiation. Last RT planned on  10/21/2021.    #Left parotid incidental uptake - PET June 8th, 2023-stable tiny left parotid nodule with increased hypermetabolism in the interval. Metastatic disease not excluded although this may represent a tiny parotid neoplasm. STABLE.   # COPD [Dr.A]/allergies likely secondary to poorly controlled COPD. Continue using albuterol 3-4 times a day and also compliance with Advair.  continue Singulair.  STABLE  # Left chest wall pain:-improved/resolved-using hydrocodone qhs-STABLE;   # Right eye vision changes-3.6 mm lesion noted in the right eye;s/p ophthalmology evaluation at [facility]. 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 JUlY 2023- s/p revaluation with [facility] opth end of month- STABLE.   # Diabetes-A1c 6.9 [AUG 2022]-Fatsing-129  Continue glipizide 5 mg BID STABLE;    #Cough secondary to radiation-called in a prescription for Tessalon Perles.STABLE;    # Diarrhea-G-1-on immoiudm/ Oral mucositis- G-1-STABLE;    # Nausea- G-1 -Zofran Dex Compazine Zofran- [if worse would consider Aloxi]STABLE;    # Hypocalcemia: on Vit D BID [8.5].  JUNE  VitD levels-48.  STABLE;     # Mediport placement-s/p dye study c tPA-functioning. STABLE;    * B12 - on 8/2  HOLD -keytruda 6/21& 7/12 & 8/02; 8/23.   #DISPOSITION: # Alimta today # follow up in  3 week- MD- labs- cbc/cmp; Keytruda- Alimta; Dr.B           All questions were answered. The patient knows to call the clinic with any problems, questions or concerns.       Govinda R Brahmanday, MD 10/07/2021 9:29 AM     

## 2021-10-08 ENCOUNTER — Ambulatory Visit
Admission: RE | Admit: 2021-10-08 | Discharge: 2021-10-08 | Disposition: A | Discharge status: Home / Self Care | Payer: 59 | Source: Ambulatory Visit | Attending: Radiation Oncology | Admitting: Radiation Oncology

## 2021-10-08 ENCOUNTER — Other Ambulatory Visit: Payer: Self-pay

## 2021-10-08 ENCOUNTER — Inpatient Hospital Stay: Payer: 59

## 2021-10-08 DIAGNOSIS — Z5111 Encounter for antineoplastic chemotherapy: Secondary | ICD-10-CM | POA: Diagnosis not present

## 2021-10-08 LAB — RAD ONC ARIA SESSION SUMMARY
Course Elapsed Days: 3
Plan Fractions Treated to Date: 4
Plan Prescribed Dose Per Fraction: 2 Gy
Plan Total Fractions Prescribed: 10
Plan Total Prescribed Dose: 20 Gy
Reference Point Dosage Given to Date: 8 Gy
Reference Point Session Dosage Given: 2 Gy
Session Number: 4

## 2021-10-08 LAB — T4: T4, Total: 11.5 ug/dL (ref 4.5–12.0)

## 2021-10-09 ENCOUNTER — Ambulatory Visit: Payer: 59

## 2021-10-12 ENCOUNTER — Ambulatory Visit
Admission: RE | Admit: 2021-10-12 | Discharge: 2021-10-12 | Disposition: A | Discharge status: Home / Self Care | Payer: 59 | Source: Ambulatory Visit | Attending: Radiation Oncology | Admitting: Radiation Oncology

## 2021-10-12 ENCOUNTER — Other Ambulatory Visit: Payer: Self-pay

## 2021-10-12 ENCOUNTER — Inpatient Hospital Stay: Payer: 59

## 2021-10-12 DIAGNOSIS — Z5111 Encounter for antineoplastic chemotherapy: Secondary | ICD-10-CM | POA: Diagnosis not present

## 2021-10-12 LAB — RAD ONC ARIA SESSION SUMMARY
Course Elapsed Days: 7
Plan Fractions Treated to Date: 5
Plan Prescribed Dose Per Fraction: 2 Gy
Plan Total Fractions Prescribed: 10
Plan Total Prescribed Dose: 20 Gy
Reference Point Dosage Given to Date: 10 Gy
Reference Point Session Dosage Given: 2 Gy
Session Number: 5

## 2021-10-13 ENCOUNTER — Inpatient Hospital Stay: Payer: 59

## 2021-10-13 ENCOUNTER — Other Ambulatory Visit: Payer: Self-pay

## 2021-10-13 ENCOUNTER — Ambulatory Visit
Admission: RE | Admit: 2021-10-13 | Discharge: 2021-10-13 | Disposition: A | Discharge status: Home / Self Care | Payer: 59 | Source: Ambulatory Visit | Attending: Radiation Oncology | Admitting: Radiation Oncology

## 2021-10-13 DIAGNOSIS — Z5111 Encounter for antineoplastic chemotherapy: Secondary | ICD-10-CM | POA: Diagnosis not present

## 2021-10-13 LAB — RAD ONC ARIA SESSION SUMMARY
Course Elapsed Days: 8
Plan Fractions Treated to Date: 6
Plan Prescribed Dose Per Fraction: 2 Gy
Plan Total Fractions Prescribed: 10
Plan Total Prescribed Dose: 20 Gy
Reference Point Dosage Given to Date: 12 Gy
Reference Point Session Dosage Given: 2 Gy
Session Number: 6

## 2021-10-14 ENCOUNTER — Inpatient Hospital Stay: Payer: 59

## 2021-10-14 ENCOUNTER — Other Ambulatory Visit: Payer: Self-pay

## 2021-10-14 ENCOUNTER — Ambulatory Visit
Admission: RE | Admit: 2021-10-14 | Discharge: 2021-10-14 | Disposition: A | Discharge status: Home / Self Care | Payer: 59 | Source: Ambulatory Visit | Attending: Radiation Oncology | Admitting: Radiation Oncology

## 2021-10-14 DIAGNOSIS — Z5111 Encounter for antineoplastic chemotherapy: Secondary | ICD-10-CM | POA: Diagnosis not present

## 2021-10-14 LAB — RAD ONC ARIA SESSION SUMMARY
Course Elapsed Days: 9
Plan Fractions Treated to Date: 7
Plan Prescribed Dose Per Fraction: 2 Gy
Plan Total Fractions Prescribed: 10
Plan Total Prescribed Dose: 20 Gy
Reference Point Dosage Given to Date: 14 Gy
Reference Point Session Dosage Given: 2 Gy
Session Number: 7

## 2021-10-15 ENCOUNTER — Ambulatory Visit
Admission: RE | Admit: 2021-10-15 | Discharge: 2021-10-15 | Disposition: A | Discharge status: Home / Self Care | Payer: 59 | Source: Ambulatory Visit | Attending: Radiation Oncology | Admitting: Radiation Oncology

## 2021-10-15 ENCOUNTER — Other Ambulatory Visit: Payer: Self-pay

## 2021-10-15 ENCOUNTER — Inpatient Hospital Stay: Payer: 59

## 2021-10-15 DIAGNOSIS — Z5111 Encounter for antineoplastic chemotherapy: Secondary | ICD-10-CM | POA: Diagnosis not present

## 2021-10-15 LAB — RAD ONC ARIA SESSION SUMMARY
Course Elapsed Days: 10
Plan Fractions Treated to Date: 8
Plan Prescribed Dose Per Fraction: 2 Gy
Plan Total Fractions Prescribed: 10
Plan Total Prescribed Dose: 20 Gy
Reference Point Dosage Given to Date: 16 Gy
Reference Point Session Dosage Given: 2 Gy
Session Number: 8

## 2021-10-16 ENCOUNTER — Ambulatory Visit
Admission: RE | Admit: 2021-10-16 | Discharge: 2021-10-16 | Disposition: A | Discharge status: Home / Self Care | Payer: 59 | Source: Ambulatory Visit | Attending: Radiation Oncology | Admitting: Radiation Oncology

## 2021-10-16 ENCOUNTER — Inpatient Hospital Stay: Payer: 59

## 2021-10-16 ENCOUNTER — Other Ambulatory Visit: Payer: Self-pay

## 2021-10-16 ENCOUNTER — Ambulatory Visit: Payer: 59

## 2021-10-16 DIAGNOSIS — Z5112 Encounter for antineoplastic immunotherapy: Secondary | ICD-10-CM | POA: Insufficient documentation

## 2021-10-16 DIAGNOSIS — Z923 Personal history of irradiation: Secondary | ICD-10-CM | POA: Insufficient documentation

## 2021-10-16 DIAGNOSIS — F17210 Nicotine dependence, cigarettes, uncomplicated: Secondary | ICD-10-CM | POA: Insufficient documentation

## 2021-10-16 DIAGNOSIS — Z79899 Other long term (current) drug therapy: Secondary | ICD-10-CM | POA: Insufficient documentation

## 2021-10-16 DIAGNOSIS — Z510 Encounter for antineoplastic radiation therapy: Secondary | ICD-10-CM | POA: Diagnosis present

## 2021-10-16 DIAGNOSIS — C3432 Malignant neoplasm of lower lobe, left bronchus or lung: Secondary | ICD-10-CM | POA: Insufficient documentation

## 2021-10-16 DIAGNOSIS — J449 Chronic obstructive pulmonary disease, unspecified: Secondary | ICD-10-CM | POA: Insufficient documentation

## 2021-10-16 LAB — RAD ONC ARIA SESSION SUMMARY
Course Elapsed Days: 11
Plan Fractions Treated to Date: 9
Plan Prescribed Dose Per Fraction: 2 Gy
Plan Total Fractions Prescribed: 10
Plan Total Prescribed Dose: 20 Gy
Reference Point Dosage Given to Date: 18 Gy
Reference Point Session Dosage Given: 2 Gy
Session Number: 9

## 2021-10-20 ENCOUNTER — Ambulatory Visit: Payer: 59

## 2021-10-20 ENCOUNTER — Ambulatory Visit
Admission: RE | Admit: 2021-10-20 | Discharge: 2021-10-20 | Disposition: A | Discharge status: Home / Self Care | Payer: 59 | Source: Ambulatory Visit | Attending: Radiation Oncology | Admitting: Radiation Oncology

## 2021-10-20 ENCOUNTER — Inpatient Hospital Stay: Payer: 59

## 2021-10-20 ENCOUNTER — Other Ambulatory Visit: Payer: Self-pay

## 2021-10-20 DIAGNOSIS — Z510 Encounter for antineoplastic radiation therapy: Secondary | ICD-10-CM | POA: Diagnosis not present

## 2021-10-20 LAB — RAD ONC ARIA SESSION SUMMARY
Course Elapsed Days: 15
Plan Fractions Treated to Date: 10
Plan Prescribed Dose Per Fraction: 2 Gy
Plan Total Fractions Prescribed: 10
Plan Total Prescribed Dose: 20 Gy
Reference Point Dosage Given to Date: 20 Gy
Reference Point Session Dosage Given: 2 Gy
Session Number: 10

## 2021-10-21 ENCOUNTER — Encounter: Payer: Self-pay | Admitting: Internal Medicine

## 2021-10-21 ENCOUNTER — Ambulatory Visit: Payer: 59

## 2021-10-28 ENCOUNTER — Encounter: Payer: Self-pay | Admitting: Internal Medicine

## 2021-10-28 ENCOUNTER — Inpatient Hospital Stay: Payer: 59

## 2021-10-28 ENCOUNTER — Inpatient Hospital Stay (HOSPITAL_BASED_OUTPATIENT_CLINIC_OR_DEPARTMENT_OTHER): Payer: 59 | Admitting: Internal Medicine

## 2021-10-28 VITALS — BP 116/80 | HR 78 | Temp 97.8°F | Resp 16 | Ht 66.0 in | Wt 186.2 lb

## 2021-10-28 DIAGNOSIS — C3432 Malignant neoplasm of lower lobe, left bronchus or lung: Secondary | ICD-10-CM

## 2021-10-28 DIAGNOSIS — Z510 Encounter for antineoplastic radiation therapy: Secondary | ICD-10-CM | POA: Diagnosis not present

## 2021-10-28 DIAGNOSIS — M25552 Pain in left hip: Secondary | ICD-10-CM

## 2021-10-28 DIAGNOSIS — M25551 Pain in right hip: Secondary | ICD-10-CM

## 2021-10-28 LAB — CBC WITH DIFFERENTIAL/PLATELET
Abs Immature Granulocytes: 0.02 10*3/uL (ref 0.00–0.07)
Basophils Absolute: 0.1 10*3/uL (ref 0.0–0.1)
Basophils Relative: 1 %
Eosinophils Absolute: 0.2 10*3/uL (ref 0.0–0.5)
Eosinophils Relative: 3 %
HCT: 37.5 % (ref 36.0–46.0)
Hemoglobin: 11.9 g/dL — ABNORMAL LOW (ref 12.0–15.0)
Immature Granulocytes: 0 %
Lymphocytes Relative: 7 %
Lymphs Abs: 0.5 10*3/uL — ABNORMAL LOW (ref 0.7–4.0)
MCH: 32.7 pg (ref 26.0–34.0)
MCHC: 31.7 g/dL (ref 30.0–36.0)
MCV: 103 fL — ABNORMAL HIGH (ref 80.0–100.0)
Monocytes Absolute: 0.9 10*3/uL (ref 0.1–1.0)
Monocytes Relative: 12 %
Neutro Abs: 5.6 10*3/uL (ref 1.7–7.7)
Neutrophils Relative %: 77 %
Platelets: 227 10*3/uL (ref 150–400)
RBC: 3.64 MIL/uL — ABNORMAL LOW (ref 3.87–5.11)
RDW: 17.2 % — ABNORMAL HIGH (ref 11.5–15.5)
WBC: 7.2 10*3/uL (ref 4.0–10.5)
nRBC: 0 % (ref 0.0–0.2)

## 2021-10-28 LAB — COMPREHENSIVE METABOLIC PANEL
ALT: 22 U/L (ref 0–44)
AST: 24 U/L (ref 15–41)
Albumin: 3.6 g/dL (ref 3.5–5.0)
Alkaline Phosphatase: 71 U/L (ref 38–126)
Anion gap: 5 (ref 5–15)
BUN: 11 mg/dL (ref 8–23)
CO2: 26 mmol/L (ref 22–32)
Calcium: 8.6 mg/dL — ABNORMAL LOW (ref 8.9–10.3)
Chloride: 107 mmol/L (ref 98–111)
Creatinine, Ser: 0.86 mg/dL (ref 0.44–1.00)
GFR, Estimated: >60 mL/min (ref >60)
Glucose, Bld: 124 mg/dL — ABNORMAL HIGH (ref 70–99)
Potassium: 3.9 mmol/L (ref 3.5–5.1)
Sodium: 138 mmol/L (ref 135–145)
Total Bilirubin: 0.4 mg/dL (ref 0.3–1.2)
Total Protein: 6.8 g/dL (ref 6.5–8.1)

## 2021-10-28 MED ORDER — SODIUM CHLORIDE 0.9 % IV SOLN
Freq: Once | INTRAVENOUS | Status: AC
  Filled 2021-10-28: qty 250

## 2021-10-28 MED ORDER — DEXAMETHASONE SODIUM PHOSPHATE 10 MG/ML IJ SOLN
4.0000 mg | Freq: Once | INTRAMUSCULAR | Status: AC
  Administered 2021-10-28: 4 mg via INTRAVENOUS

## 2021-10-28 MED ORDER — SODIUM CHLORIDE 0.9 % IV SOLN
200.0000 mg | Freq: Once | INTRAVENOUS | Status: AC
  Administered 2021-10-28: 200 mg via INTRAVENOUS
  Filled 2021-10-28: qty 8

## 2021-10-28 MED ORDER — DEXAMETHASONE SODIUM PHOSPHATE 10 MG/ML IJ SOLN
10.0000 mg | Freq: Once | INTRAMUSCULAR | Status: DC
  Filled 2021-10-28: qty 1

## 2021-10-28 MED ORDER — SODIUM CHLORIDE 0.9 % IV SOLN
500.0000 mg/m2 | Freq: Once | INTRAVENOUS | Status: AC
  Administered 2021-10-28: 1000 mg via INTRAVENOUS
  Filled 2021-10-28: qty 40

## 2021-10-28 MED ORDER — SODIUM CHLORIDE 0.9 % IV SOLN: 8.0000 mg | Freq: Once | INTRAVENOUS | Status: DC

## 2021-10-28 MED ORDER — HEPARIN SOD (PORK) LOCK FLUSH 100 UNIT/ML IV SOLN
500.0000 [IU] | Freq: Once | INTRAVENOUS | Status: AC | PRN
  Administered 2021-10-28: 500 [IU]
  Filled 2021-10-28: qty 5

## 2021-10-28 MED ORDER — ONDANSETRON HCL 4 MG/2ML IJ SOLN
8.0000 mg | Freq: Once | INTRAMUSCULAR | Status: AC
  Administered 2021-10-28: 8 mg via INTRAVENOUS
  Filled 2021-10-28: qty 4

## 2021-10-28 MED ORDER — SODIUM CHLORIDE 0.9 % IV SOLN: 4.0000 mg | Freq: Once | INTRAVENOUS | Status: DC

## 2021-10-28 MED ORDER — PROCHLORPERAZINE EDISYLATE 10 MG/2ML IJ SOLN
10.0000 mg | Freq: Once | INTRAMUSCULAR | Status: AC
  Administered 2021-10-28: 10 mg via INTRAVENOUS
  Filled 2021-10-28: qty 2

## 2021-10-28 NOTE — Assessment & Plan Note (Addendum)
#  Stage IV -non-small cell favor adenocarcinoma; s/p carbo-alimta-keytruda. CUrrently on Alimta-keytruda maintrence MAY 30th, 2023- Interval increase in size of the 2.5 cm spiculated left lower lobe pulmonary nodule with a new solid 5 mm nodule adjacent to the spiculated mass as well as increased size of the subcarinal and hilar lymph nodes, findings which are concerning for disease progression.JUNE 8th, 2023-  Enlarging left lower lobe pulmonary nodule seen on recent CT scan is markedly hypermetabolic, consistent with neoplasm; Hypermetabolic nodal metastatic disease in the left hilum and central mediastinum.   # Currently on maintenance; Alimta  Last RT s/p  on 10/20/2021. HELD  Keytruda while on radiation.  In the worsening pain bilateral recommend-PET scan in 3 weeks.  # Proceed with  maintaintnance Keytruda- Alimta ONLY today Labs today reviewed;mild anemia- Hb 11;   acceptable for treatment today.   #Left parotid incidental uptake - PET June 8th, 2023-stable tiny left parotid nodule with increased hypermetabolism in the interval. Metastatic disease not excluded although this may represent a tiny parotid neoplasm. STABLE.   # COPD [Dr.A]/allergies likely secondary to poorly controlled COPD. Continue using albuterol 3-4 times a day and also compliance with Advair.  continue Singulair.  STABLE;  # Left chest wall pain:-improved/resolved-using hydrocodone qhs-STABLE;   # Right eye vision changes-3.6 mm lesion noted in the right eye;s/p ophthalmology evaluation at [facility]. 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 JUlY 2023- s/p revaluation with [facility] opth end of month- STABLE.   # Diabetes-A1c 6.9 [AUG 2022]-Fatsing-131 Continue glipizide 5 mg BID STABLE;    #Cough secondary to radiation-currently on Tessalon Perles.STABLE;    # Diarrhea-G-1-on immoiudm/ Oral mucositis- G-1-STABLE;   # Hypocalcemia:  on Vit D BID [8.6; ca-kidney stones].  JUNE  VitD levels-48.  STABLE;   # Mediport placement-s/p dye study c tPA-functioning. STABLE;   * B12 - on 11/18/2021 -  #DISPOSITION: # left/right hip x-rays.  # Alimta today # follow up in  3 week- MD- labs- cbc/cmp; Keytruda- Alimta; PET scan Dr.B  Oct 24th- right;Nov 7th-left eye..  25th sep pre-op

## 2021-10-28 NOTE — Patient Instructions (Signed)
[facility] AT [place]  Discharge Instructions: Thank you for choosing [facility] to provide your oncology and hematology care.  If you have a lab appointment with the [facility], please go directly to the [facility] [place] and check in at the registration area.  Wear comfortable clothing and clothing appropriate for easy access to any Portacath or PICC line.   We strive to give you quality time with your provider. You may need to reschedule your appointment if you arrive late (15 or more minutes).  Arriving late affects you and other patients whose appointments are after yours.  Also, if you miss three or more appointments without notifying the office, you may be dismissed from the clinic at the provider's discretion.      For prescription refill requests, have your pharmacy contact our office and allow 72 hours for refills to be completed.    Today you received the following chemotherapy and/or immunotherapy agents Alimta and Keytruda      To help prevent nausea and vomiting after your treatment, we encourage you to take your nausea medication as directed.  BELOW ARE SYMPTOMS THAT SHOULD BE REPORTED IMMEDIATELY: *FEVER GREATER THAN 100.4 F (38 C) OR HIGHER *CHILLS OR SWEATING *NAUSEA AND VOMITING THAT IS NOT CONTROLLED WITH YOUR NAUSEA MEDICATION *UNUSUAL SHORTNESS OF BREATH *UNUSUAL BRUISING OR BLEEDING *URINARY PROBLEMS (pain or burning when urinating, or frequent urination) *BOWEL PROBLEMS (unusual diarrhea, constipation, pain near the anus) TENDERNESS IN MOUTH AND THROAT WITH OR WITHOUT PRESENCE OF ULCERS (sore throat, sores in mouth, or a toothache) UNUSUAL RASH, SWELLING OR PAIN  UNUSUAL VAGINAL DISCHARGE OR ITCHING   Items with * indicate a potential emergency and should be followed up as soon as possible or go to the Emergency Department if any problems should occur.  Please show the CHEMOTHERAPY ALERT CARD or IMMUNOTHERAPY ALERT CARD at  check-in to the Emergency Department and triage nurse.  Should you have questions after your visit or need to cancel or reschedule your appointment, please contact [facility] [place] AT [place]  [phone] and follow the prompts.  Office hours are 8:00 a.m. to 4:30 p.m. Monday - Friday. Please note that voicemails left after 4:00 p.m. may not be returned until the following business day.  We are closed weekends and major holidays. You have access to a nurse at all times for urgent questions. Please call the main number to the clinic [phone] and follow the prompts.  For any non-urgent questions, you may also contact your provider using MyChart. We now offer e-Visits for anyone 66 and older to request care online for non-urgent symptoms. For details visit [email or web address].   Also download the MyChart app! Go to the app store, search "MyChart", open the app, select [place], and log in with your MyChart username and password.  Masks are optional in the cancer centers. If you would like for your care team to wear a mask while they are taking care of you, please let them know. For doctor visits, patients may have with them one support person who is at least 64 years old. At this time, visitors are not allowed in the infusion area.

## 2021-10-28 NOTE — Progress Notes (Signed)
Right eye cataract surgery scheduled on 12/08/21 by [facility] in [place] with surgery being at same day surgery in [place].    Worsening left hip pain.  New burning sensation going down right leg for the past couple of days.

## 2021-10-28 NOTE — Progress Notes (Signed)
[place] CONSULT NOTE  Patient Care Team: Dion Body, MD as PCP - General (Family Medicine) Leata Mouse (Inactive) Byrnett, Forest Gleason, MD (General Surgery) Telford Nab, RN as Oncology Nurse Navigator Cammie Sickle, MD as Consulting Physician (Oncology)  CHIEF COMPLAINTS/PURPOSE OF CONSULTATION: lung cancer  Oncology History Overview Note  IMPRESSION: Hypermetabolic solid pulmonary nodule of the left lower lobe, favor primary lung malignancy.   Numerous hypermetabolic nodules of the lower left pleura, compatible with pleural metastatic disease.   Hypermetabolic subcarinal and left hilar lymph nodes, compatible with metastatic disease.   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 Recommend attention on follow-up.   Mild asymmetric FDG uptake below mediastinal blood pool within a small nodule of the left parotid gland, favored to be benign. Recommend attention on follow-up.   No evidence of metastatic disease in the abdomen or pelvis.   No evidence of osseous metastatic disease.   ---------------------  # MARCH 2022- incidental LLL [ 0.9 cm] s/p COVID vaccine; [Dr.Aleskerov]; July 2022- left pleural pain-   #  #October 2022 MRI brain-punctate lesion-asymptomatic [D/w- Dr.Vaslow]; December 2022 MRI negative for any acute process; chronic sclerosis of hippocampus-clinically insignificant.  COPD-non-complaint    ------------------   DIAGNOSIS:  A. PLEURAL MASS, LEFT CHEST; BIOPSY:  - DIAGNOSTIC OF MALIGNANCY.  - NON-SMALL CELL CARCINOMA, FAVOR ADENOCARCINOMA.   Comment:  The tumor cells are positive for CK7 and TTF1 (patchy).  They are  negative for p40.   # 12/10/2020-carbo Alimta; cycle #1.    # JUNE 8th, 2023-  Enlarging left  lower lobe pulmonary nodule seen on recent CT scan is markedly hypermetabolic, consistent with neoplasm; Hypermetabolic nodal metastatic disease in the left hilum and central mediastinum. JUNE- 2023- RT 20 Fractions.   #NGS PD-L1 TPS 1%   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[place])  05/02/2015 Initial Diagnosis   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[facility])   Cancer of lower lobe of left lung ([place])  12/02/2020 Initial Diagnosis   Cancer of lower lobe of left lung ([place])   12/10/2020 Cancer Staging   Staging form: Lung, AJCC 8th Edition - Clinical: Stage IVA (cT4, cN3, cM1a) - Signed by Cammie Sickle, MD on 12/10/2020   12/10/2020 - 09/16/2021 Chemotherapy   Patient is on Treatment Plan : LUNG CARBOplatin / Pemetrexed / Pembrolizumab q21d Induction x 4 cycles / Maintenance Pemetrexed + Pembrolizumab     12/10/2020 -  Chemotherapy   Patient is on Treatment Plan : LUNG Carboplatin (5) + Pemetrexed (500) + Pembrolizumab (200) D1 q21d Induction x 4 cycles / Maintenance Pemetrexed (500) + Pembrolizumab (200) D1 q21d        HISTORY OF PRESENTING ILLNESS: Ambulating independently.  Alone.  Felicia Manning 66 y.o.  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 Patient currently s/p radiation for increasing size of the left lower lobe pulm nodule; right hilar/mediastinal lymph nodes.  Finished don sep5th.   Complains of bilateral hip pain. Worsening in last few weeks. No falls.   Patient's cough improved on Tessalon Perles..  Mild diarrhea- mild nausea currently improved with antiemetics.  Appetite is good.  No weight loss.  Patient is considering cataract surgery- pre-op on 9/25.    Review of Systems  Constitutional:  Positive for malaise/fatigue. Negative for chills, diaphoresis, fever and weight loss.  HENT:  Negative for nosebleeds and sore throat.    Eyes:  Negative for double vision.  Respiratory:  Positive for cough and sputum production. Negative for hemoptysis and shortness of breath.   Cardiovascular:  Negative for palpitations, orthopnea and leg swelling.  Gastrointestinal:  Negative for abdominal pain, blood in stool, constipation, diarrhea, heartburn, melena, nausea and vomiting.  Genitourinary:  Negative for dysuria, frequency and urgency.  Musculoskeletal:  Negative for back pain and joint pain.  Skin: Negative.  Negative for itching and rash.  Neurological:  Negative for dizziness, tingling, focal weakness and weakness.  Endo/Heme/Allergies:  Does not bruise/bleed easily.  Psychiatric/Behavioral:  Negative for depression. The patient is not nervous/anxious and does not have insomnia.      MEDICAL HISTORY:  Past Medical History:  Diagnosis Date   Barrett's esophagus    Dysrhythmia    stress related at work.   GERD (gastroesophageal reflux disease)    Hepatitis C 2008   Hyperlipidemia    Hypertension    Hypothyroidism    Nephrolithiasis    Non-small cell carcinoma of left lung (H[facility])    Osteoporosis    Pulmonary mass    Thyroid cancer ([place]) 2012   Thyroid cancer ([place])    Tumor    tumor per pt back of rt eye    SURGICAL HISTORY: Past Surgical History:  Procedure Laterality Date   ABDOMINAL HYSTERECTOMY  2006   COLONOSCOPY WITH PROPOFOL N/A 04/25/2015   Procedure: COLONOSCOPY WITH PROPOFOL;  Surgeon: Josefine Class, MD;  Location[facility];  Service: Endoscopy;  Laterality: N/A;   CYSTOSCOPY W/ RETROGRADES Bilateral 05/19/2015   Procedure: CYSTOSCOPY WITH RETROGRADE PYELOGRAM;  Surgeon: Hollice Espy, MD;  Location: [facility] ORS;  Service: Urology;  Laterality: Bilateral;   CYSTOSCOPY W/ URETERAL STENT PLACEMENT Right 05/26/2015   Procedure: CYSTOSCOPY WITH STENT REPLACEMENT;  Surgeon: Hollice Espy, MD;  Location: [facility] ORS;  Service: Urology;  Laterality: Right;   CYSTOSCOPY WITH STENT PLACEMENT Right  05/19/2015   Procedure: CYSTOSCOPY WITH STENT PLACEMENT;  Surgeon: Hollice Espy, MD;  Location: [facility] ORS;  Service: Urology;  Laterality: Right;   CYSTOSCOPY WITH STENT PLACEMENT Right 05/26/2015   Procedure: CYSTOSCOPY WITH STENT PLACEMENT;  Surgeon: Hollice Espy, MD;  Location: [facility] OR[facility];  Service: Urology;  Laterality: Right;   CYSTOSCOPY WITH STENT PLACEMENT Right 06/25/2016   Procedure: CYSTOSCOPY WITH STENT PLACEMENT;  Surgeon: Nickie Retort, MD;  Location: [facility] OR[facility];  Service: Urology;  Laterality: Right;   CYSTOSCOPY WITH STENT PLACEMENT Left 10/31/2017   Procedure: CYSTOSCOPY WITH STENT PLACEMENT;  Surgeon: Hollice Espy, MD;  Location: [facility] OR[facility];  Service: Urology;  Laterality: Left;   CYSTOSCOPY/URETEROSCOPY/HOLMIUM LASER/STENT PLACEMENT Left 07/23/2015   Procedure: CYSTOSCOPY/URETEROSCOPY/HOLMIUM LASER/STENT PLACEMENT/RETROGRADE PYELOGRAM;  Surgeon: Hollice Espy, MD;  Location: [facility] OR[facility];  Service: Urology;  Laterality: Left;   CYSTOSCOPY/URETEROSCOPY/HOLMIUM LASER/STENT PLACEMENT Left 11/16/2017   Procedure: CYSTOSCOPY/URETEROSCOPY/HOLMIUM LASER/STENT Exchange;  Surgeon: Hollice Espy, MD;  Location: [facility] ORS;  Service: Urology;  Laterality: Left;   ESOPHAGOGASTRODUODENOSCOPY (EGD) WITH PROPOFOL N/A 04/25/2015   Procedure: ESOPHAGOGASTRODUODENOSCOPY (EGD) WITH PROPOFOL;  Surgeon: Josefine Class, MD;  Location: [facility] ENDOSCOPY;  Service: Endoscopy;  Laterality: N/A;   EYE SURGERY Bilateral 1964   lazy eye repair   EYE SURGERY Bilateral 2001   lazy eye repair   IR IMAGING GUIDED PORT INSERTION  12/08/2020   LITHOTRIPSY  2009   THYROIDECTOMY  2010   URETEROSCOPY WITH HOLMIUM LASER  LITHOTRIPSY Right 05/19/2015   Procedure: URETEROSCOPY WITH HOLMIUM LASER LITHOTRIPSY;  Surgeon: Hollice Espy, MD;  Location: [facility] OR[facility];  Service: Urology;  Laterality: Right;   URETEROSCOPY WITH HOLMIUM LASER LITHOTRIPSY Right 05/26/2015   Procedure: URETEROSCOPY WITH HOLMIUM LASER LITHOTRIPSY;   Surgeon: Hollice Espy, MD;  Location: [facility] OR[facility];  Service: Urology;  Laterality: Right;   URETEROSCOPY WITH HOLMIUM LASER LITHOTRIPSY Right 06/25/2016   Procedure: URETEROSCOPY WITH HOLMIUM LASER LITHOTRIPSY;  Surgeon: Nickie Retort, MD;  Location: [facility] ORS;  Service: Urology;  Laterality: Right;    SOCIAL HISTORY: Social History   Socioeconomic History   Marital status: Divorced    Spouse name: Not on file   Number of children: Not on file   Years of education: Not on file   Highest education level: Not on file  Occupational History   Not on file  Tobacco Use   Smoking status: Every Day    Packs/day: 1.00    Years: 40.00    Total pack years: 40.00    Types: Cigarettes   Smokeless tobacco: Never  Vaping Use   Vaping Use: Every day  Substance and Sexual Activity   Alcohol use: Not Currently    Comment: socially   Drug use: No   Sexual activity: Not Currently    Birth control/protection: None  Other Topics Concern   Not on file  Social History Narrative   Smoking: 1p 1-2 days since 17 years; alcohol: rare; 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     Social Determinants of Health   Financial Resource Strain: Not on file  Food Insecurity: Not on file  Transportation Needs: Unmet Transportation Needs (10/02/2021)   PRAPARE - Hydrologist (Medical): Yes    Lack of Transportation (Non-Medical): Yes  Physical Activity: Not on file  Stress: Not on file  Social Connections: Not on file  Intimate Partner Violence: Not on file    FAMILY HISTORY: Family History  Problem Relation Age of Onset   CAD Mother    Heart disease Mother    Dementia Father    Bladder Cancer Neg Hx    Prostate cancer Neg Hx    Kidney cancer Neg Hx    Breast cancer Neg Hx     ALLERGIES:  has No Known Allergies.  MEDICATIONS:  Current Outpatient Medications  Medication Sig Dispense Refill    Accu-Chek Softclix Lancets lancets Check glucose once a day 100 each 12   albuterol (VENTOLIN HFA) 108 (90 Base) MCG/ACT inhaler Inhale 2 puffs into the lungs every 6 (six) hours as needed for wheezing or shortness of breath. 8 g 2   amLODipine (NORVASC) 5 MG tablet Take 5 mg by mouth every morning.      ARTIFICIAL TEAR OP Apply to eye.     benzonatate (TESSALON PERLES) 100 MG capsule Take 1 capsule (100 mg total) by mouth 3 (three) times daily as needed for cough. 90 capsule 1   blood glucose meter kit and supplies KIT Dispense based on patient and insurance preference. Use up to three times daily as directed. 1 each 0   cholecalciferol (VITAMIN D) 1000 units tablet Take 2,000 Units by mouth daily.     Cranberry-Vitamin C-Probiotic (AZO CRANBERRY PO) Take 1 tablet by mouth daily.     fluticasone-salmeterol (ADVAIR HFA) 45-21 MCG/ACT inhaler Inhale 2 puffs into the lungs 2 (two) times daily. 1 each 12   folic acid (FOLVITE) 1  MG tablet Take 1 tablet (1 mg total) by mouth daily. 90 tablet 1   glipiZIDE (GLUCOTROL) 5 MG tablet Take 1 tablet (5 mg total) by mouth 2 (two) times daily before a meal. 180 tablet 1   HYDROcodone-acetaminophen (NORCO/VICODIN) 5-325 MG tablet Take 1 tablet by mouth every 8 (eight) hours as needed for moderate pain. 60 tablet 0   levothyroxine (SYNTHROID) 175 MCG tablet Saturday and sunday     levothyroxine (SYNTHROID, LEVOTHROID) 150 MCG tablet Take 150 mcg by mouth at bedtime. Mon-friday     lidocaine-prilocaine (EMLA) cream Apply 1 application topically as needed. 30 g 0   loperamide (IMODIUM) 2 MG capsule Take by mouth as needed for diarrhea or loose stools.     losartan (COZAAR) 100 MG tablet Take 100 mg by mouth every morning.      metoprolol succinate (TOPROL-XL) 25 MG 24 hr tablet Take 25 mg by mouth every morning.   1   montelukast (SINGULAIR) 10 MG tablet TAKE 1 TABLET BY MOUTH AT BEDTIME 30 tablet 0   omeprazole (PRILOSEC) 20 MG capsule Take 20 mg by mouth  daily before breakfast.      ondansetron (ZOFRAN) 8 MG tablet Take 1 tablet (8 mg total) by mouth every 8 (eight) hours as needed for nausea or vomiting. Start on the third day after chemotherapy 30 tablet 2   prochlorperazine (COMPAZINE) 10 MG tablet Take 1 tablet (10 mg total) by mouth every 6 (six) hours as needed for nausea or vomiting. 30 tablet 2   nystatin (MYCOSTATIN) 100000 UNIT/ML suspension Swish and swallow. Don't drink fluids immediately following administration. (Patient not taking: Reported on 06/24/2021) 400 mL 0   sucralfate (CARAFATE) 1 g tablet Take 1 tablet (1 g total) by mouth 4 (four) times daily -  with meals and at bedtime. (Patient not taking: Reported on 10/28/2021) 60 tablet 2   No current facility-administered medications for this visit.   Facility-Administered Medications Ordered in Other Visits  Medication Dose Route Frequency Provider Last Rate Last Admin   0.9 %  sodium chloride infusion   Intravenous Once Cammie Sickle, MD       dexamethasone (DECADRON) injection 10 mg  10 mg Intravenous Once Charlaine Dalton R, MD       heparin lock flush 100 unit/mL  500 Units Intracatheter Once PRN Cammie Sickle, MD       ondansetron [facility]) injection 8 mg  8 mg Intravenous Once Cammie Sickle, MD       pembrolizumab [facility]) 200 mg in sodium chloride 0.9 % 50 mL chemo infusion  200 mg Intravenous Once Charlaine Dalton R, MD       PEMEtrexed (ALIMTA) 1,000 mg in sodium chloride 0.9 % 100 mL chemo infusion  500 mg/m2 (Treatment Plan Recorded) Intravenous Once Cammie Sickle, MD       prochlorperazine (COMPAZINE) injection 10 mg  10 mg Intravenous Once Cammie Sickle, MD          .  PHYSICAL EXAMINATION: ECOG PERFORMANCE STATUS: 1 - Symptomatic but completely ambulatory  Vitals:   10/28/21 1000  BP: 116/80  Pulse: 78  Resp: 16  Temp: 97.8 F (36.6 C)     Filed Weights   10/28/21 1000  Weight: 186 lb 3.2 oz (84.5 kg)     No significant right upper quadrant tenderness noted.  Physical Exam Vitals and nursing note reviewed.  HENT:     Head: Normocephalic and atraumatic.     Mouth/Throat:  Pharynx: Oropharynx is clear.  Eyes:     Extraocular Movements: Extraocular movements intact.     Pupils: Pupils are equal, round, and reactive to light.  Cardiovascular:     Rate and Rhythm: Normal rate and regular rhythm.  Pulmonary:     Comments: Decreased breath sounds bilaterally.  Abdominal:     Palpations: Abdomen is soft.  Musculoskeletal:        General: Normal range of motion.     Cervical back: Normal range of motion.  Skin:    General: Skin is warm.  Neurological:     General: No focal deficit present.     Mental Status: She is alert and oriented to person, place, and time.  Psychiatric:        Behavior: Behavior normal.        Judgment: Judgment normal.      LABORATORY DATA:  I have reviewed the data as listed Lab Results  Component Value Date   WBC 7.2 10/28/2021   HGB 11.9 (L) 10/28/2021   HCT 37.5 10/28/2021   MCV 103.0 (H) 10/28/2021   PLT 227 10/28/2021   Recent Labs    09/16/21 0843 10/07/21 0847 10/28/21 0933  NA 139 138 138  K 4.0 3.6 3.9  CL 103 105 107  CO2 _0 GLUCOSE 129* 133* 124*  BUN _1 CREATININE 0.88 0.83 0.86  CALCIUM 9.2 8.5* 8.6*  GFRNONAA >60 >60 >60  PROT 7.0 6.7 6.8  ALBUMIN 3.7 3.5 3.6  AST _2 ALT _3 ALKPHOS 66 59 71  BILITOT 0.4 0.6 0.4    RADIOGRAPHIC STUDIES: I have personally reviewed the radiological images as listed and agreed with the findings in the report. No results found.  ASSESSMENT & PLAN:   Cancer of lower lobe of left lung ([place]) #Stage IV -non-small cell favor adenocarcinoma; s/p carbo-alimta-keytruda. CUrrently on Alimta-keytruda maintrence MAY 30th, 2023- Interval increase in size of the 2.5 cm spiculated left lower lobe pulmonary nodule with a new solid 5 mm nodule adjacent to the spiculated  mass as well as increased size of the subcarinal and hilar lymph nodes, findings which are concerning for disease progression.JUNE 8th, 2023-  Enlarging left lower lobe pulmonary nodule seen on recent CT scan is markedly hypermetabolic, consistent with neoplasm; Hypermetabolic nodal metastatic disease in the left hilum and central mediastinum.   # Currently on maintenance; Alimta  Last RT s/p  on 10/20/2021. HELD  Keytruda while on radiation.  In the worsening pain bilateral recommend-PET scan in 3 weeks.  # Proceed with  maintaintnance Keytruda- Alimta ONLY today Labs today reviewed;mild anemia- Hb 11;   acceptable for treatment today.   #Left parotid incidental uptake - PET June 8th, 2023-stable tiny left parotid nodule with increased hypermetabolism in the interval. Metastatic disease not excluded although this may represent a tiny parotid neoplasm. STABLE.   # COPD [Dr.A]/allergies likely secondary to poorly controlled COPD. Continue using albuterol 3-4 times a day and also compliance with Advair.  continue Singulair.  STABLE;  # Left chest wall pain:-improved/resolved-using hydrocodone qhs-STABLE;   # Right eye vision changes-3.6 mm lesion noted in the right eye;s/p ophthalmology evaluation at [facility]. 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 JUlY 2023- s/p revaluation with [facility] opth end of month- STABLE.   # Diabetes-A1c 6.9 [AUG 2022]-Fatsing-131 Continue glipizide 5 mg BID STABLE;    #Cough secondary to  radiation-currently on [employer].STABLE;    # Diarrhea-G-1-on immoiudm/ Oral mucositis- G-1-STABLE;   # Hypocalcemia: on Vit D BID [8.6; ca-kidney stones].  JUNE  VitD levels-48.  STABLE;   # Mediport placement-s/p dye study c tPA-functioning. STABLE;   * B12 - on 11/18/2021 -  #DISPOSITION: # left/right hip x-rays.  # Alimta today # follow up in  3 week- MD- labs- cbc/cmp;  Keytruda- Alimta; PET scan Dr.B  Oct 24th- right;Nov 7th-left eye..  25th sep pre-op        All questions were answered. The patient knows to call the clinic with any problems, questions or concerns.       Cammie Sickle, MD 10/28/2021 10:46 AM

## 2021-10-29 ENCOUNTER — Encounter: Payer: Self-pay | Admitting: Internal Medicine

## 2021-10-29 ENCOUNTER — Ambulatory Visit
Admission: RE | Admit: 2021-10-29 | Discharge: 2021-10-29 | Disposition: A | Discharge status: Home / Self Care | Payer: 59 | Attending: Internal Medicine | Admitting: Internal Medicine

## 2021-10-29 ENCOUNTER — Ambulatory Visit
Admission: RE | Admit: 2021-10-29 | Discharge: 2021-10-29 | Disposition: A | Discharge status: Home / Self Care | Payer: 59 | Source: Ambulatory Visit | Attending: Internal Medicine | Admitting: Internal Medicine

## 2021-10-29 DIAGNOSIS — C3432 Malignant neoplasm of lower lobe, left bronchus or lung: Secondary | ICD-10-CM | POA: Diagnosis present

## 2021-10-29 DIAGNOSIS — M25551 Pain in right hip: Secondary | ICD-10-CM | POA: Insufficient documentation

## 2021-10-29 DIAGNOSIS — M25552 Pain in left hip: Secondary | ICD-10-CM | POA: Diagnosis present

## 2021-11-02 DIAGNOSIS — C439 Malignant melanoma of skin, unspecified: Secondary | ICD-10-CM | POA: Insufficient documentation

## 2021-11-04 ENCOUNTER — Ambulatory Visit: Payer: 59 | Admitting: Radiation Oncology

## 2021-11-05 ENCOUNTER — Ambulatory Visit: Payer: Self-pay | Admitting: Nurse Practitioner

## 2021-11-05 ENCOUNTER — Encounter: Payer: Self-pay | Admitting: Nurse Practitioner

## 2021-11-05 DIAGNOSIS — R110 Nausea: Secondary | ICD-10-CM

## 2021-11-05 DIAGNOSIS — R053 Chronic cough: Secondary | ICD-10-CM

## 2021-11-05 DIAGNOSIS — C3432 Malignant neoplasm of lower lobe, left bronchus or lung: Secondary | ICD-10-CM

## 2021-11-05 DIAGNOSIS — Z515 Encounter for palliative care: Secondary | ICD-10-CM

## 2021-11-05 NOTE — Progress Notes (Signed)
[facility] Consult Note Telephone: [phone]  Fax: [phone]    Date of encounter: 11/05/21 3:47 PM PATIENT NAME: Felicia Manning [address] [id number]   [phone] (home)  DOB: December 18, 1955 MRN: [id number] PRIMARY CARE PROVIDER:    Dion Body, MD,  [place] [address] [phone]  RESPONSIBLE PARTY:    Contact Information     Name Relation Home Work Mobile   Manning,Felicia Daughter   [phone]      Due to the COVID-19 crisis, this visit was done via telemedicine from my office and it was initiated and consent by this patient and or family.  I connected with  Felicia Manning OR PROXY on 11/05/21 by telephone as video not available enabled telemedicine application and verified that I am speaking with the correct person.   I discussed the limitations of evaluation and management by telemedicine. The patient expressed understanding and agreed to proceed.   Palliative Care was asked to follow this patient by consultation request of  Dion Body, MD to address advance care planning and complex medical decision making. This is a follow up visit.                                  ASSESSMENT AND PLAN / RECOMMENDATIONS:  Symptom Management/Plan: 1. Advance Care Planning; DNR; MOST form in vynca with wishes for limited treatment options, short term hospitalization, wishes are for antibiotics, IVF, no feeding tube; will need further LTC planning with family for goc with disease progression.    2. Nausea ongoing secondary to restarting [place]. Discussed diet, nutrition, antiemetics zofran which has improved and does have enough zofran, no rx needed. Encouraged hydration, no vomiting.  3. Chronic intermit cough ongoing secondary to lung cancer with, tobacco abuse, discussed, educated   4. Palliative care encounter; Palliative care encounter; Palliative  medicine team will continue to support patient, patient's family, and medical team. Visit consisted of counseling and education dealing with the complex and emotionally intense issues of symptom management and palliative care in the setting of serious and potentially life-threatening illness   Follow up Palliative Care Visit: Palliative care will continue to follow for complex medical decision making, advance care planning, and clarification of goals. Return 12 weeks or prn.   I spent 31 minutes providing this consultation. More than 50% of the time in this consultation was spent in counseling and care coordination. PPS: 70% Chief Complaint: Follow up palliative consult for complex medical decision making, address goals, manage ongoing symptoms   HISTORY OF PRESENT ILLNESS:  Felicia Manning is a 66 y.o. 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 I called Felicia Manning for telephonic, telemedicine visit as video not available. We talked about how she has been feeling, "not good". Ongoing nausea, discussed symptom mangament with zofran, nutrition. We talked about when she restarted [place] the last time same effects which eventually improved. We talked about importance of self care, hydration, resting, sleeping. We talked about upcoming diagnostic testing, visit with Dr Donella Stade. We talked about family dynamics, support system, medical goals.  We talked about role pc in  poc. F/u PC visit scheduled. Most PC visit supportive counseling; Therapeutic listening, emotional support provided. Questions answered   History obtained from review of EMR,  discussion with Felicia Manning.  I reviewed available labs, medications, imaging, studies and related documents from the EMR.  Records reviewed and summarized above.    ROS 10 point system reviewed all negative except +cough chronic   Physical Exam:  Thank you for the opportunity to participate in the care of Felicia Manning.  The palliative care team will continue to follow. Please call our office at [phone] if we can be of additional assistance.   Tykwon Fera Ihor Gully, NP

## 2021-11-11 ENCOUNTER — Other Ambulatory Visit: Payer: Self-pay | Admitting: Internal Medicine

## 2021-11-11 ENCOUNTER — Telehealth: Payer: Self-pay

## 2021-11-11 DIAGNOSIS — C3432 Malignant neoplasm of lower lobe, left bronchus or lung: Secondary | ICD-10-CM

## 2021-11-11 DIAGNOSIS — M25551 Pain in right hip: Secondary | ICD-10-CM

## 2021-11-11 NOTE — Telephone Encounter (Signed)
We were notified by [facility] PA department that PET scan has been denied, result of standard imaging needed prior to PET.   Dr. B has ordered bone scan/CT scans.  Please schedule and inform pt of appt details.

## 2021-11-12 ENCOUNTER — Ambulatory Visit: Payer: 59

## 2021-11-12 NOTE — Telephone Encounter (Signed)
Message from [place] that bone scan has to be done at [facility].  APPROVED FOR [facility] FACILITY ONLY Per [facility] Auth# [id number] valid 11/12/21-03/12/22 for CT Chest/71260 Auth# [id number] valid 11/12/21-05/11/22 for CT abd/ pelvis [phone]

## 2021-11-13 ENCOUNTER — Other Ambulatory Visit: Payer: Self-pay | Admitting: Internal Medicine

## 2021-11-13 ENCOUNTER — Other Ambulatory Visit: Payer: Self-pay | Admitting: Hospice and Palliative Medicine

## 2021-11-13 MED ORDER — HYDROCODONE-ACETAMINOPHEN 5-325 MG PO TABS: 1.0000 | ORAL_TABLET | Freq: Three times a day (TID) | ORAL | 0 refills | Status: DC | PRN

## 2021-11-13 NOTE — Telephone Encounter (Signed)
Patient states she will call insurance for an in-network facility.

## 2021-11-13 NOTE — Telephone Encounter (Signed)
Scheduling called [facility] to schedule bone scan but they do not perform those at the facility.  [facility] ins [facility] [place]auf[place] dept will contact patient to discuss.

## 2021-11-16 ENCOUNTER — Encounter: Payer: Self-pay | Admitting: Internal Medicine

## 2021-11-16 MED ORDER — MONTELUKAST SODIUM 10 MG PO TABS: 10.0000 mg | ORAL_TABLET | Freq: Every day | ORAL | 0 refills | Status: DC

## 2021-11-18 ENCOUNTER — Encounter: Payer: Self-pay | Admitting: Internal Medicine

## 2021-11-18 ENCOUNTER — Inpatient Hospital Stay: Payer: 59 | Attending: Internal Medicine | Admitting: Internal Medicine

## 2021-11-18 ENCOUNTER — Inpatient Hospital Stay: Payer: 59

## 2021-11-18 ENCOUNTER — Other Ambulatory Visit: Payer: Self-pay | Admitting: *Deleted

## 2021-11-18 VITALS — BP 100/71 | HR 76 | Temp 97.2°F | Resp 18 | Wt 182.4 lb

## 2021-11-18 DIAGNOSIS — Z7951 Long term (current) use of inhaled steroids: Secondary | ICD-10-CM | POA: Diagnosis not present

## 2021-11-18 DIAGNOSIS — E119 Type 2 diabetes mellitus without complications: Secondary | ICD-10-CM | POA: Insufficient documentation

## 2021-11-18 DIAGNOSIS — Z5112 Encounter for antineoplastic immunotherapy: Secondary | ICD-10-CM | POA: Insufficient documentation

## 2021-11-18 DIAGNOSIS — J449 Chronic obstructive pulmonary disease, unspecified: Secondary | ICD-10-CM | POA: Diagnosis not present

## 2021-11-18 DIAGNOSIS — C3432 Malignant neoplasm of lower lobe, left bronchus or lung: Secondary | ICD-10-CM | POA: Diagnosis present

## 2021-11-18 DIAGNOSIS — Z79899 Other long term (current) drug therapy: Secondary | ICD-10-CM | POA: Diagnosis not present

## 2021-11-18 LAB — CBC WITH DIFFERENTIAL/PLATELET
Abs Immature Granulocytes: 0.05 10*3/uL (ref 0.00–0.07)
Basophils Absolute: 0.1 10*3/uL (ref 0.0–0.1)
Basophils Relative: 1 %
Eosinophils Absolute: 0.1 10*3/uL (ref 0.0–0.5)
Eosinophils Relative: 2 %
HCT: 35.2 % — ABNORMAL LOW (ref 36.0–46.0)
Hemoglobin: 11.1 g/dL — ABNORMAL LOW (ref 12.0–15.0)
Immature Granulocytes: 1 %
Lymphocytes Relative: 5 %
Lymphs Abs: 0.3 10*3/uL — ABNORMAL LOW (ref 0.7–4.0)
MCH: 31.4 pg (ref 26.0–34.0)
MCHC: 31.5 g/dL (ref 30.0–36.0)
MCV: 99.7 fL (ref 80.0–100.0)
Monocytes Absolute: 0.8 10*3/uL (ref 0.1–1.0)
Monocytes Relative: 12 %
Neutro Abs: 5.4 10*3/uL (ref 1.7–7.7)
Neutrophils Relative %: 79 %
Platelets: 216 10*3/uL (ref 150–400)
RBC: 3.53 MIL/uL — ABNORMAL LOW (ref 3.87–5.11)
RDW: 16.4 % — ABNORMAL HIGH (ref 11.5–15.5)
WBC: 6.7 10*3/uL (ref 4.0–10.5)
nRBC: 0 % (ref 0.0–0.2)

## 2021-11-18 LAB — COMPREHENSIVE METABOLIC PANEL
ALT: 19 U/L (ref 0–44)
AST: 28 U/L (ref 15–41)
Albumin: 3.4 g/dL — ABNORMAL LOW (ref 3.5–5.0)
Alkaline Phosphatase: 68 U/L (ref 38–126)
Anion gap: 7 (ref 5–15)
BUN: 10 mg/dL (ref 8–23)
CO2: 25 mmol/L (ref 22–32)
Calcium: 8.4 mg/dL — ABNORMAL LOW (ref 8.9–10.3)
Chloride: 105 mmol/L (ref 98–111)
Creatinine, Ser: 0.84 mg/dL (ref 0.44–1.00)
GFR, Estimated: >60 mL/min (ref >60)
Glucose, Bld: 164 mg/dL — ABNORMAL HIGH (ref 70–99)
Potassium: 3.8 mmol/L (ref 3.5–5.1)
Sodium: 137 mmol/L (ref 135–145)
Total Bilirubin: 0.4 mg/dL (ref 0.3–1.2)
Total Protein: 6.8 g/dL (ref 6.5–8.1)

## 2021-11-18 MED ORDER — SODIUM CHLORIDE 0.9 % IV SOLN
500.0000 mg/m2 | Freq: Once | INTRAVENOUS | Status: AC
  Administered 2021-11-18: 1000 mg via INTRAVENOUS
  Filled 2021-11-18: qty 40

## 2021-11-18 MED ORDER — PROCHLORPERAZINE EDISYLATE 10 MG/2ML IJ SOLN
10.0000 mg | Freq: Once | INTRAMUSCULAR | Status: AC
  Administered 2021-11-18: 10 mg via INTRAVENOUS
  Filled 2021-11-18: qty 2

## 2021-11-18 MED ORDER — SODIUM CHLORIDE 0.9 % IV SOLN: 8.0000 mg | Freq: Once | INTRAVENOUS | Status: DC

## 2021-11-18 MED ORDER — HEPARIN SOD (PORK) LOCK FLUSH 100 UNIT/ML IV SOLN
500.0000 [IU] | Freq: Once | INTRAVENOUS | Status: AC | PRN
  Filled 2021-11-18: qty 5

## 2021-11-18 MED ORDER — ONDANSETRON HCL 4 MG/2ML IJ SOLN
8.0000 mg | Freq: Once | INTRAMUSCULAR | Status: AC
  Administered 2021-11-18: 8 mg via INTRAVENOUS
  Filled 2021-11-18: qty 4

## 2021-11-18 MED ORDER — DEXAMETHASONE SODIUM PHOSPHATE 10 MG/ML IJ SOLN
4.0000 mg | Freq: Once | INTRAMUSCULAR | Status: AC
  Administered 2021-11-18: 4 mg via INTRAVENOUS
  Filled 2021-11-18: qty 1

## 2021-11-18 MED ORDER — HEPARIN SOD (PORK) LOCK FLUSH 100 UNIT/ML IV SOLN
INTRAVENOUS | Status: AC
  Administered 2021-11-18: 500 [IU]
  Filled 2021-11-18: qty 5

## 2021-11-18 MED ORDER — SODIUM CHLORIDE 0.9 % IV SOLN: 4.0000 mg | Freq: Once | INTRAVENOUS | Status: DC

## 2021-11-18 MED ORDER — SODIUM CHLORIDE 0.9 % IV SOLN
Freq: Once | INTRAVENOUS | Status: AC
  Filled 2021-11-18: qty 250

## 2021-11-18 MED ORDER — SODIUM CHLORIDE 0.9% FLUSH
10.0000 mL | Freq: Once | INTRAVENOUS | Status: AC
  Administered 2021-11-18: 10 mL via INTRAVENOUS
  Filled 2021-11-18: qty 10

## 2021-11-18 MED ORDER — SODIUM CHLORIDE 0.9 % IV SOLN
200.0000 mg | Freq: Once | INTRAVENOUS | Status: AC
  Administered 2021-11-18: 200 mg via INTRAVENOUS
  Filled 2021-11-18: qty 8

## 2021-11-18 MED ORDER — FOLIC ACID 1 MG PO TABS: 1.0000 mg | ORAL_TABLET | Freq: Every day | ORAL | 1 refills | Status: DC

## 2021-11-18 MED ORDER — CYANOCOBALAMIN 1000 MCG/ML IJ SOLN
1000.0000 ug | Freq: Once | INTRAMUSCULAR | Status: AC
  Administered 2021-11-18: 1000 ug via INTRAMUSCULAR
  Filled 2021-11-18: qty 1

## 2021-11-18 MED ORDER — GLIPIZIDE 5 MG PO TABS: 5.0000 mg | ORAL_TABLET | Freq: Two times a day (BID) | ORAL | 1 refills | Status: DC

## 2021-11-18 NOTE — Assessment & Plan Note (Addendum)
#  Stage IV -non-small cell favor adenocarcinoma;  MAY 30th, 2023- Interval increase in size of the 2.5 cm spiculated left lower lobe pulmonary nodule with a new solid 5 mm nodule adjacent to the spiculated mass as well as increased size of the subcarinal and hilar lymph nodes, findings which are concerning for disease progression.JUNE 8th, 2023-  Enlarging left lower lobe pulmonary nodule seen on recent CT scan is markedly hypermetabolic, consistent with neoplasm; Hypermetabolic nodal metastatic disease in the left hilum and central mediastinum.  Patient currently on Alimta-keytruda maintenance.  # Given worsening pain- awaiting reimaging with CT scan [oct 5th]; bone scan-given the worsening hip pain-pending insurance approval.  # Proceed with  maintaintnance Keytruda- Alimta ONLY today Labs today reviewed;mild anemia- Hb 11;   acceptable for treatment today. Cataract surgery on 10/24.  Next therapy in 4 weeks.  # COPD [Dr.A]/allergies likely secondary to poorly controlled COPD. Continue using albuterol 3-4 times a day and also compliance with Advair.  continue Singulair.  STABLE;  # Right eye vision changes-3.6 mm lesion noted in the right eye;s/p ophthalmology evaluation at [facility]. 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 JUlY 2023- s/p revaluation with [facility] opth end of month- STABLE.   # Diabetes-A1c 6.9 [AUG 2022]-PBF- 164- Continue glipizide 5 mg BID STABLE;    # Cough secondary to radiation-currently on Tessalon Perles.STABLE;    # Hypocalcemia: on Vit D BID [8.6; ca-kidney stones].  JUNE  VitD levels-48.  STABLE;    # Mediport placement-s/p dye study c tPA-functioning. STABLE;   * B12 - on 11/18/2021 - 4w- catatarct s.   #DISPOSITION: # Clide Deutscher today; ADD B12 injection # follow up in 4 week- MD- labs- cbc/cmp; Keytruda- Alimta;  Dr.B  Oct 24th- right; Nov 7th-left eye..  25th sep  pre-op

## 2021-11-18 NOTE — Telephone Encounter (Signed)
Called patient insurance and they do have [facility] location as in network for [place] bone scan.

## 2021-11-18 NOTE — Progress Notes (Signed)
Patient here today for follow up and treatment consideration regarding lung cancer. Patient reports continued pain that is severe at times to left hip. Pain is worse with sitting and going from sitting to standing. Patient did have hip xray after last treatment. Patient is taking hydrocodone and this does help with pain. Patient also reports she is having nausea daily early in the morning, appetite is decreased.

## 2021-11-18 NOTE — Progress Notes (Signed)
[place] CONSULT NOTE  Patient Care Team: Dion Body, MD as PCP - General (Family Medicine) Leata Mouse (Inactive) Byrnett, Forest Gleason, MD (General Surgery) Telford Nab, RN as Oncology Nurse Navigator Cammie Sickle, MD as Consulting Physician (Oncology)  CHIEF COMPLAINTS/PURPOSE OF CONSULTATION: lung cancer  Oncology History Overview Note  IMPRESSION: Hypermetabolic solid pulmonary nodule of the left lower lobe, favor primary lung malignancy.   Numerous hypermetabolic nodules of the lower left pleura, compatible with pleural metastatic disease.   Hypermetabolic subcarinal and left hilar lymph nodes, compatible with metastatic disease.   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 Recommend attention on follow-up.   Mild asymmetric FDG uptake below mediastinal blood pool within a small nodule of the left parotid gland, favored to be benign. Recommend attention on follow-up.   No evidence of metastatic disease in the abdomen or pelvis.   No evidence of osseous metastatic disease.   ---------------------  # MARCH 2022- incidental LLL [ 0.9 cm] s/p COVID vaccine; [Dr.Aleskerov]; July 2022- left pleural pain-   #  #October 2022 MRI brain-punctate lesion-asymptomatic [D/w- Dr.Vaslow]; December 2022 MRI negative for any acute process; chronic sclerosis of hippocampus-clinically insignificant.  COPD-non-complaint    ------------------   DIAGNOSIS:  A. PLEURAL MASS, LEFT CHEST; BIOPSY:  - DIAGNOSTIC OF MALIGNANCY.  - NON-SMALL CELL CARCINOMA, FAVOR ADENOCARCINOMA.   Comment:  The tumor cells are positive for CK7 and TTF1 (patchy).  They are  negative for p40.   # 12/10/2020-carbo Alimta; cycle #1.    # JUNE 8th, 2023-  Enlarging left  lower lobe pulmonary nodule seen on recent CT scan is markedly hypermetabolic, consistent with neoplasm; Hypermetabolic nodal metastatic disease in the left hilum and central mediastinum. JUNE- 2023- RT 20 Fractions.   #NGS PD-L1 TPS 1%   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[place])  05/02/2015 Initial Diagnosis   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H[facility])   Cancer of lower lobe of left lung ([place])  12/02/2020 Initial Diagnosis   Cancer of lower lobe of left lung ([place])   12/10/2020 Cancer Staging   Staging form: Lung, AJCC 8th Edition - Clinical: Stage IVA (cT4, cN3, cM1a) - Signed by Cammie Sickle, MD on 12/10/2020   12/10/2020 - 09/16/2021 Chemotherapy   Patient is on Treatment Plan : LUNG CARBOplatin / Pemetrexed / Pembrolizumab q21d Induction x 4 cycles / Maintenance Pemetrexed + Pembrolizumab     12/10/2020 -  Chemotherapy   Patient is on Treatment Plan : LUNG Carboplatin (5) + Pemetrexed (500) + Pembrolizumab (200) D1 q21d Induction x 4 cycles / Maintenance Pemetrexed (500) + Pembrolizumab (200) D1 q21d        HISTORY OF PRESENTING ILLNESS: Ambulating independently.  Accompanied by his sister.   Felicia Manning 66 y.o.  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 Patient currently s/p radiation for increasing size of the left lower lobe pulm nodule; right hilar/mediastinal lymph nodes.  Finished done sep 5th.   In the interim PET scan denied by insurance.  Patient here today for follow up and treatment consideration regarding lung cancer. Patient reports continued pain that is severe at times to left hip. Pain is worse with sitting and going from sitting to standing. Patient did have hip xray  after last treatment. Patient is taking hydrocodone and this does help with pain. Patient also reports she is having nausea daily early in the morning,  appetite is decreased.   Patient's cough improved on Tessalon Perles..  Mild diarrhea- mild nausea currently improved with antiemetics.  No weight loss.   Review of Systems  Constitutional:  Positive for malaise/fatigue. Negative for chills, diaphoresis, fever and weight loss.  HENT:  Negative for nosebleeds and sore throat.   Eyes:  Negative for double vision.  Respiratory:  Positive for cough and sputum production. Negative for hemoptysis and shortness of breath.   Cardiovascular:  Negative for palpitations, orthopnea and leg swelling.  Gastrointestinal:  Negative for abdominal pain, blood in stool, constipation, diarrhea, heartburn, melena, nausea and vomiting.  Genitourinary:  Negative for dysuria, frequency and urgency.  Musculoskeletal:  Negative for back pain and joint pain.  Skin: Negative.  Negative for itching and rash.  Neurological:  Negative for dizziness, tingling, focal weakness and weakness.  Endo/Heme/Allergies:  Does not bruise/bleed easily.  Psychiatric/Behavioral:  Negative for depression. The patient is not nervous/anxious and does not have insomnia.      MEDICAL HISTORY:  Past Medical History:  Diagnosis Date   Barrett's esophagus    Dysrhythmia    stress related at work.   GERD (gastroesophageal reflux disease)    Hepatitis C 2008   Hyperlipidemia    Hypertension    Hypothyroidism    Nephrolithiasis    Non-small cell carcinoma of left lung (HCC)    Osteoporosis    Pulmonary mass    Thyroid cancer ([place]) 2012   Thyroid cancer ([place])    Tumor    tumor per pt back of rt eye    SURGICAL HISTORY: Past Surgical History:  Procedure Laterality Date   ABDOMINAL HYSTERECTOMY  2006   COLONOSCOPY WITH PROPOFOL N/A 04/25/2015   Procedure: COLONOSCOPY WITH PROPOFOL;  Surgeon: Josefine Class, MD;  Location: [facility] ENDOSCOPY;  Service: Endoscopy;  Laterality: N/A;   CYSTOSCOPY W/ RETROGRADES Bilateral 05/19/2015   Procedure: CYSTOSCOPY WITH RETROGRADE PYELOGRAM;   Surgeon: Hollice Espy, MD;  Location: [facility] ORS;  Service: Urology;  Laterality: Bilateral;   CYSTOSCOPY W/ URETERAL STENT PLACEMENT Right 05/26/2015   Procedure: CYSTOSCOPY WITH STENT REPLACEMENT;  Surgeon: Hollice Espy, MD;  Location: [facility] ORS;  Service: Urology;  Laterality: Right;   CYSTOSCOPY WITH STENT PLACEMENT Right 05/19/2015   Procedure: CYSTOSCOPY WITH STENT PLACEMENT;  Surgeon: Hollice Espy, MD;  Location: [facility] ORS;  Service: Urology;  Laterality: Right;   CYSTOSCOPY WITH STENT PLACEMENT Right 05/26/2015   Procedure: CYSTOSCOPY WITH STENT PLACEMENT;  Surgeon: Hollice Espy, MD;  Location: [facility] ORS;  Service: Urology;  Laterality: Right;   CYSTOSCOPY WITH STENT PLACEMENT Right 06/25/2016   Procedure: CYSTOSCOPY WITH STENT PLACEMENT;  Surgeon: Nickie Retort, MD;  Location: [facility] ORS;  Service: Urology;  Laterality: Right;   CYSTOSCOPY WITH STENT PLACEMENT Left 10/31/2017   Procedure: CYSTOSCOPY WITH STENT PLACEMENT;  Surgeon: Hollice Espy, MD;  Location: [facility] ORS;  Service: Urology;  Laterality: Left;   CYSTOSCOPY/URETEROSCOPY/HOLMIUM LASER/STENT PLACEMENT Left 07/23/2015   Procedure: CYSTOSCOPY/URETEROSCOPY/HOLMIUM LASER/STENT PLACEMENT/RETROGRADE PYELOGRAM;  Surgeon: Hollice Espy, MD;  Location: [facility] ORS;  Service: Urology;  Laterality: Left;   CYSTOSCOPY/URETEROSCOPY/HOLMIUM LASER/STENT PLACEMENT Left 11/16/2017   Procedure: CYSTOSCOPY/URETEROSCOPY/HOLMIUM LASER/STENT Exchange;  Surgeon: Hollice Espy, MD;  Location: [facility] ORS;  Service: Urology;  Laterality: Left;   ESOPHAGOGASTRODUODENOSCOPY (EGD) WITH PROPOFOL N/A 04/25/2015   Procedure: ESOPHAGOGASTRODUODENOSCOPY (EGD) WITH PROPOFOL;  Surgeon: Josefine Class, MD;  Location: [facility] ENDOSCOPY;  Service: Endoscopy;  Laterality: N/A;   EYE SURGERY Bilateral 1964   lazy eye repair   EYE SURGERY Bilateral 2001   lazy eye repair   IR IMAGING GUIDED PORT INSERTION  12/08/2020   LITHOTRIPSY  2009   THYROIDECTOMY  2010    URETEROSCOPY WITH HOLMIUM LASER LITHOTRIPSY Right 05/19/2015   Procedure: URETEROSCOPY WITH HOLMIUM LASER LITHOTRIPSY;  Surgeon: Hollice Espy, MD;  Location: [facility] ORS;  Service: Urology;  Laterality: Right;   URETEROSCOPY WITH HOLMIUM LASER LITHOTRIPSY Right 05/26/2015   Procedure: URETEROSCOPY WITH HOLMIUM LASER LITHOTRIPSY;  Surgeon: Hollice Espy, MD;  Location: [facility] ORS;  Service: Urology;  Laterality: Right;   URETEROSCOPY WITH HOLMIUM LASER LITHOTRIPSY Right 06/25/2016   Procedure: URETEROSCOPY WITH HOLMIUM LASER LITHOTRIPSY;  Surgeon: Nickie Retort, MD;  Location: [facility] ORS;  Service: Urology;  Laterality: Right;    SOCIAL HISTORY: Social History   Socioeconomic History   Marital status: Divorced    Spouse name: Not on file   Number of children: Not on file   Years of education: Not on file   Highest education level: Not on file  Occupational History   Not on file  Tobacco Use   Smoking status: Every Day    Packs/day: 1.00    Years: 40.00    Total pack years: 40.00    Types: Cigarettes   Smokeless tobacco: Never  Vaping Use   Vaping Use: Every day  Substance and Sexual Activity   Alcohol use: Not Currently    Comment: socially   Drug use: No   Sexual activity: Not Currently    Birth control/protection: None  Other Topics Concern   Not on file  Social History Narrative   Smoking: 1p 1-2 days since 17 years; alcohol: rare; 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     Social Determinants of Health   Financial Resource Strain: Not on file  Food Insecurity: Not on file  Transportation Needs: Unmet Transportation Needs (10/02/2021)   PRAPARE - Hydrologist (Medical): Yes    Lack of Transportation (Non-Medical): Yes  Physical Activity: Not on file  Stress: Not on file  Social Connections: Not on file  Intimate Partner Violence: Not on file    FAMILY  HISTORY: Family History  Problem Relation Age of Onset   CAD Mother    Heart disease Mother    Dementia Father    Bladder Cancer Neg Hx    Prostate cancer Neg Hx    Kidney cancer Neg Hx    Breast cancer Neg Hx     ALLERGIES:  has No Known Allergies.  MEDICATIONS:  Current Outpatient Medications  Medication Sig Dispense Refill   Accu-Chek Softclix Lancets lancets Check glucose once a day 100 each 12   albuterol (VENTOLIN HFA) 108 (90 Base) MCG/ACT inhaler Inhale 2 puffs into the lungs every 6 (six) hours as needed for wheezing or shortness of breath. 8 g 2   amLODipine (NORVASC) 5 MG tablet Take 5 mg by mouth every morning.      ARTIFICIAL TEAR OP Apply to eye.     benzonatate (TESSALON PERLES) 100 MG capsule Take 1 capsule (100 mg total) by mouth 3 (three) times daily as needed for cough. 90 capsule 1   blood glucose meter kit and supplies KIT Dispense based on patient and insurance preference. Use up to three times daily as directed. 1 each 0  cholecalciferol (VITAMIN D) 1000 units tablet Take 2,000 Units by mouth daily.     Cranberry-Vitamin C-Probiotic (AZO CRANBERRY PO) Take 1 tablet by mouth daily.     fluticasone-salmeterol (ADVAIR HFA) 45-21 MCG/ACT inhaler Inhale 2 puffs into the lungs 2 (two) times daily. 1 each 12   folic acid (FOLVITE) 1 MG tablet Take 1 tablet (1 mg total) by mouth daily. 90 tablet 1   glipiZIDE (GLUCOTROL) 5 MG tablet Take 1 tablet (5 mg total) by mouth 2 (two) times daily before a meal. 180 tablet 1   HYDROcodone-acetaminophen (NORCO/VICODIN) 5-325 MG tablet Take 1 tablet by mouth every 8 (eight) hours as needed for moderate pain. 60 tablet 0   levothyroxine (SYNTHROID) 175 MCG tablet Saturday and sunday     levothyroxine (SYNTHROID, LEVOTHROID) 150 MCG tablet Take 150 mcg by mouth at bedtime. Mon-friday     lidocaine-prilocaine (EMLA) cream Apply 1 application topically as needed. 30 g 0   loperamide (IMODIUM) 2 MG capsule Take by mouth as needed for  diarrhea or loose stools.     losartan (COZAAR) 100 MG tablet Take 100 mg by mouth every morning.      metoprolol succinate (TOPROL-XL) 25 MG 24 hr tablet Take 25 mg by mouth every morning.   1   montelukast (SINGULAIR) 10 MG tablet Take 1 tablet (10 mg total) by mouth at bedtime. 30 tablet 0   nystatin (MYCOSTATIN) 100000 UNIT/ML suspension Swish and swallow. Don't drink fluids immediately following administration. 400 mL 0   omeprazole (PRILOSEC) 20 MG capsule Take 20 mg by mouth daily before breakfast.      ondansetron (ZOFRAN) 8 MG tablet Take 1 tablet (8 mg total) by mouth every 8 (eight) hours as needed for nausea or vomiting. Start on the third day after chemotherapy 30 tablet 2   prochlorperazine (COMPAZINE) 10 MG tablet Take 1 tablet (10 mg total) by mouth every 6 (six) hours as needed for nausea or vomiting. 30 tablet 2   sucralfate (CARAFATE) 1 g tablet Take 1 tablet (1 g total) by mouth 4 (four) times daily -  with meals and at bedtime. (Patient not taking: Reported on 11/18/2021) 60 tablet 2   No current facility-administered medications for this visit.   Facility-Administered Medications Ordered in Other Visits  Medication Dose Route Frequency Provider Last Rate Last Admin   heparin lock flush 100 UNIT/ML injection            heparin lock flush 100 unit/mL  500 Units Intracatheter Once PRN Cammie Sickle, MD       pembrolizumab [facility]) 200 mg in sodium chloride 0.9 % 50 mL chemo infusion  200 mg Intravenous Once Charlaine Dalton R, MD 116 mL/hr at 11/18/21 1039 200 mg at 11/18/21 1039   PEMEtrexed (ALIMTA) 1,000 mg in sodium chloride 0.9 % 100 mL chemo infusion  500 mg/m2 (Treatment Plan Recorded) Intravenous Once Cammie Sickle, MD          .  PHYSICAL EXAMINATION: ECOG PERFORMANCE STATUS: 1 - Symptomatic but completely ambulatory  Vitals:   11/18/21 0905  BP: 100/71  Pulse: 76  Resp: 18  Temp: (!) 97.2 F (36.2 C)     Filed Weights   11/18/21  0905  Weight: 182 lb 6.4 oz (82.7 kg)    No significant right upper quadrant tenderness noted.  Physical Exam Vitals and nursing note reviewed.  HENT:     Head: Normocephalic and atraumatic.     Mouth/Throat:  Pharynx: Oropharynx is clear.  Eyes:     Extraocular Movements: Extraocular movements intact.     Pupils: Pupils are equal, round, and reactive to light.  Cardiovascular:     Rate and Rhythm: Normal rate and regular rhythm.  Pulmonary:     Comments: Decreased breath sounds bilaterally.  Abdominal:     Palpations: Abdomen is soft.  Musculoskeletal:        General: Normal range of motion.     Cervical back: Normal range of motion.  Skin:    General: Skin is warm.  Neurological:     General: No focal deficit present.     Mental Status: She is alert and oriented to person, place, and time.  Psychiatric:        Behavior: Behavior normal.        Judgment: Judgment normal.      LABORATORY DATA:  I have reviewed the data as listed Lab Results  Component Value Date   WBC 6.7 11/18/2021   HGB 11.1 (L) 11/18/2021   HCT 35.2 (L) 11/18/2021   MCV 99.7 11/18/2021   PLT 216 11/18/2021   Recent Labs    10/07/21 0847 10/28/21 0933 11/18/21 0844  NA 138 138 137  K 3.6 3.9 3.8  CL 105 107 105  CO2 _0 GLUCOSE 133* 124* 164*  BUN _1 CREATININE 0.83 0.86 0.84  CALCIUM 8.5* 8.6* 8.4*  GFRNONAA >60 >60 >60  PROT 6.7 6.8 6.8  ALBUMIN 3.5 3.6 3.4*  AST _2 ALT _3 ALKPHOS 59 71 68  BILITOT 0.6 0.4 0.4    RADIOGRAPHIC STUDIES: I have personally reviewed the radiological images as listed and agreed with the findings in the report. DG HIP UNILAT W OR W/O PELVIS 2-3 VIEWS RIGHT  Result Date: 10/31/2021 CLINICAL DATA:  Bilateral hip pain EXAM: DG HIP (WITH OR WITHOUT PELVIS) 3V RIGHT COMPARISON:  None Available. FINDINGS: Pelvic ring is intact. Mild degenerative changes of the hip joints are noted bilaterally. No acute fracture or  dislocation is seen. No soft tissue changes are noted. IMPRESSION: Mild degenerative change without acute abnormality. Electronically Signed   By: Inez Catalina M.D.   On: 10/31/2021 03:19   DG HIP UNILAT W OR W/O PELVIS 2-3 VIEWS LEFT  Result Date: 10/31/2021 CLINICAL DATA:  Bilateral hip pain EXAM: DG HIP (WITH OR WITHOUT PELVIS) 3V LEFT COMPARISON:  None Available. FINDINGS: Pelvic ring is intact. Mild degenerative changes of the hip joints are noted bilaterally. No acute fracture or dislocation is noted. No soft tissue changes are seen. IMPRESSION: Degenerative change without acute abnormality. Electronically Signed   By: Inez Catalina M.D.   On: 10/31/2021 03:18    ASSESSMENT & PLAN:   Cancer of lower lobe of left lung ([place]) #Stage IV -non-small cell favor adenocarcinoma;  MAY 30th, 2023- Interval increase in size of the 2.5 cm spiculated left lower lobe pulmonary nodule with a new solid 5 mm nodule adjacent to the spiculated mass as well as increased size of the subcarinal and hilar lymph nodes, findings which are concerning for disease progression.JUNE 8th, 2023-  Enlarging left lower lobe pulmonary nodule seen on recent CT scan is markedly hypermetabolic, consistent with neoplasm; Hypermetabolic nodal metastatic disease in the left hilum and central mediastinum.  Patient currently on Alimta-keytruda maintenance.  # Given worsening pain- awaiting reimaging with CT scan [oct 5th]; bone scan-given the worsening hip pain-pending insurance approval.  # Proceed  with  maintaintnance Keytruda- Alimta ONLY today Labs today reviewed;mild anemia- Hb 11;   acceptable for treatment today. Cataract surgery on 10/24.  Next therapy in 4 weeks.  # COPD [Dr.A]/allergies likely secondary to poorly controlled COPD. Continue using albuterol 3-4 times a day and also compliance with Advair.  continue Singulair.  STABLE;  # Right eye vision changes-3.6 mm lesion noted in the right eye;s/p ophthalmology evaluation at  [facility]. 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 JUlY 2023- s/p revaluation with [facility] opth end of month- STABLE.   # Diabetes-A1c 6.9 [AUG 2022]-PBF- 164- Continue glipizide 5 mg BID STABLE;    # Cough secondary to radiation-currently on Tessalon Perles.STABLE;    # Hypocalcemia: on Vit D BID [8.6; ca-kidney stones].  JUNE  VitD levels-48.  STABLE;    # Mediport placement-s/p dye study c tPA-functioning. STABLE;   * B12 - on 11/18/2021 - 4w- catatarct s.   #DISPOSITION: # Clide Deutscher today; ADD B12 injection # follow up in 4 week- MD- labs- cbc/cmp; Keytruda- Alimta;  Dr.B  Oct 24th- right; Nov 7th-left eye..  25th sep pre-op        All questions were answered. The patient knows to call the clinic with any problems, questions or concerns.       Cammie Sickle, MD 11/18/2021 11:08 AM

## 2021-11-18 NOTE — Telephone Encounter (Signed)
Patient is scheduled for bone scan on October 18 at [facility].

## 2021-11-18 NOTE — Patient Instructions (Signed)
[facility] CANCER CTR AT [place]  Discharge Instructions: Thank you for choosing [facility] to provide your oncology and hematology care.  If you have a lab appointment with the [facility], please go directly to the [facility] and check in at the registration area.  Wear comfortable clothing and clothing appropriate for easy access to any Portacath or PICC line.   We strive to give you quality time with your provider. You may need to reschedule your appointment if you arrive late (15 or more minutes).  Arriving late affects you and other patients whose appointments are after yours.  Also, if you miss three or more appointments without notifying the office, you may be dismissed from the clinic at the provider's discretion.      For prescription refill requests, have your pharmacy contact our office and allow 72 hours for refills to be completed.    Today you received the following chemotherapy and/or immunotherapy agents Keytruda, alimta       To help prevent nausea and vomiting after your treatment, we encourage you to take your nausea medication as directed.  BELOW ARE SYMPTOMS THAT SHOULD BE REPORTED IMMEDIATELY: *FEVER GREATER THAN 100.4 F (38 C) OR HIGHER *CHILLS OR SWEATING *NAUSEA AND VOMITING THAT IS NOT CONTROLLED WITH YOUR NAUSEA MEDICATION *UNUSUAL SHORTNESS OF BREATH *UNUSUAL BRUISING OR BLEEDING *URINARY PROBLEMS (pain or burning when urinating, or frequent urination) *BOWEL PROBLEMS (unusual diarrhea, constipation, pain near the anus) TENDERNESS IN MOUTH AND THROAT WITH OR WITHOUT PRESENCE OF ULCERS (sore throat, sores in mouth, or a toothache) UNUSUAL RASH, SWELLING OR PAIN  UNUSUAL VAGINAL DISCHARGE OR ITCHING   Items with * indicate a potential emergency and should be followed up as soon as possible or go to the Emergency Department if any problems should occur.  Please show the CHEMOTHERAPY ALERT CARD or IMMUNOTHERAPY ALERT CARD at  check-in to the Emergency Department and triage nurse.  Should you have questions after your visit or need to cancel or reschedule your appointment, please contact [facility] CANCER [place] AT [place]  [phone] and follow the prompts.  Office hours are 8:00 a.m. to 4:30 p.m. Monday - Friday. Please note that voicemails left after 4:00 p.m. may not be returned until the following business day.  We are closed weekends and major holidays. You have access to a nurse at all times for urgent questions. Please call the main number to the clinic [phone] and follow the prompts.  For any non-urgent questions, you may also contact your provider using MyChart. We now offer e-Visits for anyone 50 and older to request care online for non-urgent symptoms. For details visit [email or web address].   Also download the MyChart app! Go to the app store, search "MyChart", open the app, select [facility], and log in with your MyChart username and password.  Masks are optional in the cancer centers. If you would like for your care team to wear a mask while they are taking care of you, please let them know. For doctor visits, patients may have with them one support person who is at least 64 years old. At this time, visitors are not allowed in the infusion area.

## 2021-11-19 ENCOUNTER — Inpatient Hospital Stay: Payer: 59

## 2021-11-19 ENCOUNTER — Ambulatory Visit
Admission: RE | Admit: 2021-11-19 | Discharge: 2021-11-19 | Disposition: A | Discharge status: Home / Self Care | Payer: 59 | Source: Ambulatory Visit | Attending: Internal Medicine | Admitting: Internal Medicine

## 2021-11-19 DIAGNOSIS — M25551 Pain in right hip: Secondary | ICD-10-CM

## 2021-11-19 DIAGNOSIS — C3432 Malignant neoplasm of lower lobe, left bronchus or lung: Secondary | ICD-10-CM

## 2021-11-19 MED ORDER — IOPAMIDOL (ISOVUE-300) INJECTION 61%
100.0000 mL | Freq: Once | INTRAVENOUS | Status: AC | PRN
  Administered 2021-11-19: 100 mL via INTRAVENOUS

## 2021-11-24 ENCOUNTER — Inpatient Hospital Stay (HOSPITAL_BASED_OUTPATIENT_CLINIC_OR_DEPARTMENT_OTHER): Payer: 59 | Admitting: Hospice and Palliative Medicine

## 2021-11-24 ENCOUNTER — Other Ambulatory Visit: Payer: Self-pay | Admitting: Internal Medicine

## 2021-11-24 ENCOUNTER — Telehealth: Payer: Self-pay | Admitting: Internal Medicine

## 2021-11-24 DIAGNOSIS — C3432 Malignant neoplasm of lower lobe, left bronchus or lung: Secondary | ICD-10-CM | POA: Diagnosis not present

## 2021-11-24 NOTE — Telephone Encounter (Signed)
I spoke to patient regarding the results of the CT scan new liver lesions.  We will discuss at tumor conference regarding possible biopsy.  Offered to visit patient to review the results-however patient going to [facility] to take care of her mother.   Hayley-please follow-up with the patient next week 17th-Tuesday- with tumor conference recommendations. Thanks GB

## 2021-11-24 NOTE — Progress Notes (Signed)
Virtual Visit via Telephone Note  I connected with Felicia Manning on 11/24/21 at 11:30 AM EDT by telephone and verified that I am speaking with the correct person using two identifiers.  Location: Patient: Home Provider: Clinic   I discussed the limitations, risks, security and privacy concerns of performing an evaluation and management service by telephone and the availability of in person appointments. I also discussed with the patient that there may be a patient responsible charge related to this service. The patient expressed understanding and agreed to proceed.   History of Present Illness: Felicia Manning is a 66 y.o. female with multiple medical problems including including COPD, diabetes, and stage IV non-small cell lung cancer, initially diagnosed October 2022, on chemotherapy/immunotherapy.  Patient was referred to palliative care to discuss goals and provide ongoing support for symptoms.   Observations/Objective: I called and spoke with patient by phone.    CT of the chest, abdomen, and pelvis on 11/19/2021 was concerning for new hepatic metastasis suggestive of probable disease progression.  Patient confirms that she is discussed with Dr. Rogue Bussing the results of her CTs.  It appears that patient will be presented at tumor conference.  Patient states that she has felt somewhat fatigued over the past couple days.   She has an upcoming trip planned to take care of her mother in [place].  We discussed risks associated with DVT while flying.  Recommended frequent breaks and ambulation.  Assessment and Plan: Stage IV NSCLC - on Alimta Keytruda maintenance.  Recent CT suggestive of progression with new hepatic metastasis.  Patient is pending discussion at tumor board.  Follow Up Instructions: Telephone visit 1-2 months   I discussed the assessment and treatment plan with the patient. The patient was provided an opportunity to ask questions and all were answered. The patient  agreed with the plan and demonstrated an understanding of the instructions.   The patient was advised to call back or seek an in-person evaluation if the symptoms worsen or if the condition fails to improve as anticipated.  I provided 10 minutes of non-face-to-face time during this encounter.   Irean Hong, NP

## 2021-11-25 ENCOUNTER — Inpatient Hospital Stay: Payer: 59

## 2021-11-25 ENCOUNTER — Ambulatory Visit: Payer: 59 | Admitting: Radiation Oncology

## 2021-11-26 ENCOUNTER — Telehealth: Payer: Self-pay | Admitting: Internal Medicine

## 2021-11-26 ENCOUNTER — Other Ambulatory Visit: Payer: 59

## 2021-11-26 DIAGNOSIS — C3432 Malignant neoplasm of lower lobe, left bronchus or lung: Secondary | ICD-10-CM

## 2021-11-26 DIAGNOSIS — K769 Liver disease, unspecified: Secondary | ICD-10-CM

## 2021-11-26 NOTE — Progress Notes (Signed)
Tumor Board Documentation  Felicia Manning was presented by Dr Rogue Bussing at our Tumor Board on 11/26/2021, which included representatives from medical oncology, pathology, radiology, surgical, pharmacy, pulmonology, genetics, research, navigation, radiation oncology, internal medicine.  Felicia Manning currently presents as a current patient, for discussion with history of the following treatments: immunotherapy, neoadjuvant chemotherapy.  Additionally, we reviewed previous medical and familial history, history of present illness, and recent lab results along with all available histopathologic and imaging studies. The tumor board considered available treatment options and made the following recommendations: Biopsy (Liver Biopsy IR)    The following procedures/referrals were also placed: No orders of the defined types were placed in this encounter.   Clinical Trial Status: not discussed   Staging used: To be determined AJCC Staging:       Group: New Liver Lesions, H/o LLLLung Cancer   National site-specific guidelines   were discussed with respect to the case.  Tumor board is a meeting of clinicians from various specialty areas who evaluate and discuss patients for whom a multidisciplinary approach is being considered. Final determinations in the plan of care are those of the provider(s). The responsibility for follow up of recommendations given during tumor board is that of the provider.   Today's extended care, comprehensive team conference, [name] was not present for the discussion and was not examined.   Multidisciplinary Tumor Board is a multidisciplinary case peer review process.  Decisions discussed in the Multidisciplinary Tumor Board reflect the opinions of the specialists present at the conference without having examined the patient.  Ultimately, treatment and diagnostic decisions rest with the primary provider(s) and the patient.

## 2021-11-26 NOTE — Telephone Encounter (Signed)
Felicia Manning-please reach out to patient 10/17 -next Tuesday [as patient is going out of town] re: discussion at the tumor conference; and my recommendation for liver biopsy.    The liver biopsy is ordered-please schedule a liver biopsy ASAP if patient is agreement.  Thanks, GB

## 2021-12-01 NOTE — Telephone Encounter (Signed)
Spoke with patient and she is in agreement with pursuing liver biopsy at this time. Will schedule and notify pt with appt.

## 2021-12-02 ENCOUNTER — Encounter (HOSPITAL_COMMUNITY)
Admission: RE | Admit: 2021-12-02 | Discharge: 2021-12-02 | Disposition: A | Discharge status: Home / Self Care | Payer: 59 | Source: Ambulatory Visit | Attending: Internal Medicine | Admitting: Internal Medicine

## 2021-12-02 ENCOUNTER — Telehealth: Payer: Self-pay | Admitting: *Deleted

## 2021-12-02 DIAGNOSIS — C3432 Malignant neoplasm of lower lobe, left bronchus or lung: Secondary | ICD-10-CM | POA: Insufficient documentation

## 2021-12-02 DIAGNOSIS — M25551 Pain in right hip: Secondary | ICD-10-CM | POA: Insufficient documentation

## 2021-12-02 DIAGNOSIS — M25552 Pain in left hip: Secondary | ICD-10-CM | POA: Insufficient documentation

## 2021-12-02 MED ORDER — TECHNETIUM TC 99M MEDRONATE IV KIT
19.7000 | PACK | Freq: Once | INTRAVENOUS | Status: AC
  Administered 2021-12-02: 19.7 via INTRAVENOUS

## 2021-12-02 NOTE — Telephone Encounter (Signed)
Call placed to Felicia Manning to update on scheduled appointment time for biopsy. Felicia Manning is now scheduled for 11:00 procedure, arrive at the medical mall at 10:00 am. Felicia Manning confirms change in appointment time is good for her. Felicia Manning voiced concerns about insurance coverage of biopsy. Message sent to [place] and Marita [place] with concerns. Felicia Manning encouraged to call with any questions or concerns.

## 2021-12-03 ENCOUNTER — Encounter: Payer: Self-pay | Admitting: Ophthalmology

## 2021-12-07 ENCOUNTER — Telehealth: Payer: Self-pay | Admitting: Internal Medicine

## 2021-12-07 ENCOUNTER — Other Ambulatory Visit: Payer: Self-pay

## 2021-12-07 MED ORDER — HYDROCODONE-ACETAMINOPHEN 5-325 MG PO TABS: 1.0000 | ORAL_TABLET | Freq: Three times a day (TID) | ORAL | 0 refills | Status: DC | PRN

## 2021-12-07 NOTE — Discharge Instructions (Signed)

## 2021-12-08 ENCOUNTER — Ambulatory Visit: Payer: 59 | Admitting: Anesthesiology

## 2021-12-08 ENCOUNTER — Other Ambulatory Visit: Payer: Self-pay

## 2021-12-08 ENCOUNTER — Encounter
Admission: RE | Disposition: A | Discharge status: Home / Self Care | Payer: Self-pay | Source: Ambulatory Visit | Attending: Ophthalmology

## 2021-12-08 ENCOUNTER — Encounter: Payer: Self-pay | Admitting: Ophthalmology

## 2021-12-08 ENCOUNTER — Ambulatory Visit
Admission: RE | Admit: 2021-12-08 | Discharge: 2021-12-08 | Disposition: A | Discharge status: Home / Self Care | Payer: 59 | Source: Ambulatory Visit | Attending: Ophthalmology | Admitting: Ophthalmology

## 2021-12-08 ENCOUNTER — Ambulatory Visit: Payer: 59 | Admitting: Dietician

## 2021-12-08 DIAGNOSIS — Z6827 Body mass index (BMI) 27.0-27.9, adult: Secondary | ICD-10-CM | POA: Insufficient documentation

## 2021-12-08 DIAGNOSIS — E039 Hypothyroidism, unspecified: Secondary | ICD-10-CM | POA: Insufficient documentation

## 2021-12-08 DIAGNOSIS — E669 Obesity, unspecified: Secondary | ICD-10-CM | POA: Diagnosis not present

## 2021-12-08 DIAGNOSIS — Z87891 Personal history of nicotine dependence: Secondary | ICD-10-CM | POA: Insufficient documentation

## 2021-12-08 DIAGNOSIS — I10 Essential (primary) hypertension: Secondary | ICD-10-CM | POA: Insufficient documentation

## 2021-12-08 DIAGNOSIS — E785 Hyperlipidemia, unspecified: Secondary | ICD-10-CM | POA: Diagnosis not present

## 2021-12-08 DIAGNOSIS — C3490 Malignant neoplasm of unspecified part of unspecified bronchus or lung: Secondary | ICD-10-CM | POA: Diagnosis not present

## 2021-12-08 DIAGNOSIS — H2511 Age-related nuclear cataract, right eye: Secondary | ICD-10-CM | POA: Insufficient documentation

## 2021-12-08 DIAGNOSIS — Z85850 Personal history of malignant neoplasm of thyroid: Secondary | ICD-10-CM | POA: Diagnosis not present

## 2021-12-08 DIAGNOSIS — J449 Chronic obstructive pulmonary disease, unspecified: Secondary | ICD-10-CM | POA: Insufficient documentation

## 2021-12-08 DIAGNOSIS — E1136 Type 2 diabetes mellitus with diabetic cataract: Secondary | ICD-10-CM | POA: Insufficient documentation

## 2021-12-08 DIAGNOSIS — Z8505 Personal history of malignant neoplasm of liver: Secondary | ICD-10-CM | POA: Diagnosis not present

## 2021-12-08 DIAGNOSIS — E119 Type 2 diabetes mellitus without complications: Secondary | ICD-10-CM

## 2021-12-08 HISTORY — DX: Malignant neoplasm of liver, not specified as primary or secondary: C22.9

## 2021-12-08 HISTORY — DX: Type 2 diabetes mellitus without complications: E11.9

## 2021-12-08 HISTORY — DX: Presence of dental prosthetic device (complete) (partial): Z97.2

## 2021-12-08 HISTORY — PX: CATARACT EXTRACTION W/PHACO: SHX586

## 2021-12-08 HISTORY — DX: Chronic obstructive pulmonary disease, unspecified: J44.9

## 2021-12-08 HISTORY — DX: Personal history of urinary calculi: Z87.442

## 2021-12-08 LAB — GLUCOSE, CAPILLARY: Glucose-Capillary: 132 mg/dL — ABNORMAL HIGH (ref 70–99)

## 2021-12-08 SURGERY — PHACOEMULSIFICATION, CATARACT, WITH IOL INSERTION
Anesthesia: Monitor Anesthesia Care | Site: Eye | Laterality: Right

## 2021-12-08 MED ORDER — FENTANYL CITRATE (PF) 100 MCG/2ML IJ SOLN
INTRAMUSCULAR | Status: DC | PRN
  Administered 2021-12-08: 25 ug via INTRAVENOUS

## 2021-12-08 MED ORDER — BRIMONIDINE TARTRATE-TIMOLOL 0.2-0.5 % OP SOLN
OPHTHALMIC | Status: DC | PRN
  Administered 2021-12-08: 1 [drp] via OPHTHALMIC

## 2021-12-08 MED ORDER — TETRACAINE HCL 0.5 % OP SOLN
1.0000 [drp] | OPHTHALMIC | Status: DC | PRN
  Administered 2021-12-08 (×3): 1 [drp] via OPHTHALMIC

## 2021-12-08 MED ORDER — SIGHTPATH DOSE#1 NA CHONDROIT SULF-NA HYALURON 40-17 MG/ML IO SOLN
INTRAOCULAR | Status: DC | PRN
  Administered 2021-12-08: 1 mL via INTRAOCULAR

## 2021-12-08 MED ORDER — MOXIFLOXACIN HCL 0.5 % OP SOLN
OPHTHALMIC | Status: DC | PRN
  Administered 2021-12-08: 0.2 mL via OPHTHALMIC

## 2021-12-08 MED ORDER — ARMC OPHTHALMIC DILATING DROPS
1.0000 | OPHTHALMIC | Status: DC | PRN
  Administered 2021-12-08 (×3): 1 via OPHTHALMIC

## 2021-12-08 MED ORDER — SIGHTPATH DOSE#1 BSS IO SOLN
INTRAOCULAR | Status: DC | PRN
  Administered 2021-12-08: 15 mL

## 2021-12-08 MED ORDER — SIGHTPATH DOSE#1 BSS IO SOLN
INTRAOCULAR | Status: DC | PRN
  Administered 2021-12-08: 49 mL via OPHTHALMIC

## 2021-12-08 MED ORDER — SIGHTPATH DOSE#1 BSS IO SOLN
INTRAOCULAR | Status: DC | PRN
  Administered 2021-12-08: 1 mL via INTRAMUSCULAR

## 2021-12-08 MED ORDER — MIDAZOLAM HCL 2 MG/2ML IJ SOLN
INTRAMUSCULAR | Status: DC | PRN
  Administered 2021-12-08: .5 mg via INTRAVENOUS

## 2021-12-08 SURGICAL SUPPLY — 9 items
CATARACT SUITE SIGHTPATH (MISCELLANEOUS) ×1 IMPLANT
FEE CATARACT SUITE SIGHTPATH (MISCELLANEOUS) ×1 IMPLANT
GLOVE SURG ENC TEXT LTX SZ8 (GLOVE) ×1 IMPLANT
GLOVE SURG TRIUMPH 8.0 PF LTX (GLOVE) ×1 IMPLANT
LENS IOL TECNIS EYHANCE 33.0 (Intraocular Lens) IMPLANT
NDL FILTER BLUNT 18X1 1/2 (NEEDLE) ×1 IMPLANT
NEEDLE FILTER BLUNT 18X1 1/2 (NEEDLE) ×1 IMPLANT
SYR 3ML LL SCALE MARK (SYRINGE) ×1 IMPLANT
WATER STERILE IRR 250ML POUR (IV SOLUTION) ×1 IMPLANT

## 2021-12-08 NOTE — Transfer of Care (Signed)
Immediate Anesthesia Transfer of Care Note  Patient: [name]  Procedure(s) Performed: CATARACT EXTRACTION PHACO AND INTRAOCULAR LENS PLACEMENT (IOC) RIGHT DIABETIC 6.62 00:35.1 (Right: Eye)  Patient Location: PACU  Anesthesia Type: MAC  Level of Consciousness: awake, alert  and patient cooperative  Airway and Oxygen Therapy: Patient Spontanous Breathing and Patient connected to supplemental oxygen  Post-op Assessment: Post-op Vital signs reviewed, Patient's Cardiovascular Status Stable, Respiratory Function Stable, Patent Airway and No signs of Nausea or vomiting  Post-op Vital Signs: Reviewed and stable  Complications: No notable events documented.

## 2021-12-08 NOTE — H&P (Signed)
[place]   Primary Care Physician:  Dion Body, MD Ophthalmologist: Dr.Bennet Kujawa  Pre-Procedure History & Physical: HPI:  Felicia Manning is a 66 y.o. female here for cataract surgery.   Past Medical History:  Diagnosis Date   Barrett's esophagus    COPD (chronic obstructive pulmonary disease) ([place])    Dysrhythmia    stress related at work.   GERD (gastroesophageal reflux disease)    Hepatitis C 2008   Harv[place] 2017   History of kidney stones    Hyperlipidemia    Hypertension    Hypothyroidism    Liver cancer ([place])    metastasized from Lung   Nephrolithiasis    Non-small cell carcinoma of left lung ([place])    Osteoporosis    Pulmonary mass    Thyroid cancer ([place]) 2012   Thyroid cancer ([place])    Tumor    tumor per pt back of rt eye   Type 2 diabetes mellitus ([place])    Wears dentures    Full upper and lower.  Usually only wears upper    Past Surgical History:  Procedure Laterality Date   ABDOMINAL HYSTERECTOMY  2006   COLONOSCOPY WITH PROPOFOL N/A 04/25/2015   Procedure: COLONOSCOPY WITH PROPOFOL;  Surgeon: Josefine Class, MD;  Location: [facility] ENDOSCOPY;  Service: Endoscopy;  Laterality: N/A;   CYSTOSCOPY W/ RETROGRADES Bilateral 05/19/2015   Procedure: CYSTOSCOPY WITH RETROGRADE PYELOGRAM;  Surgeon: Hollice Espy, MD;  Location: [facility] ORS;  Service: Urology;  Laterality: Bilateral;   CYSTOSCOPY W/ URETERAL STENT PLACEMENT Right 05/26/2015   Procedure: CYSTOSCOPY WITH STENT REPLACEMENT;  Surgeon: Hollice Espy, MD;  Location: [facility] ORS;  Service: Urology;  Laterality: Right;   CYSTOSCOPY WITH STENT PLACEMENT Right 05/19/2015   Procedure: CYSTOSCOPY WITH STENT PLACEMENT;  Surgeon: Hollice Espy, MD;  Location: [facility] ORS;  Service: Urology;  Laterality: Right;   CYSTOSCOPY WITH STENT PLACEMENT Right 05/26/2015   Procedure: CYSTOSCOPY WITH STENT PLACEMENT;  Surgeon: Hollice Espy, MD;  Location: [facility] ORS;  Service: Urology;  Laterality: Right;   CYSTOSCOPY WITH  STENT PLACEMENT Right 06/25/2016   Procedure: CYSTOSCOPY WITH STENT PLACEMENT;  Surgeon: Nickie Retort, MD;  Location: [facility] ORS;  Service: Urology;  Laterality: Right;   CYSTOSCOPY WITH STENT PLACEMENT Left 10/31/2017   Procedure: CYSTOSCOPY WITH STENT PLACEMENT;  Surgeon: Hollice Espy, MD;  Location: [facility] ORS;  Service: Urology;  Laterality: Left;   CYSTOSCOPY/URETEROSCOPY/HOLMIUM LASER/STENT PLACEMENT Left 07/23/2015   Procedure: CYSTOSCOPY/URETEROSCOPY/HOLMIUM LASER/STENT PLACEMENT/RETROGRADE PYELOGRAM;  Surgeon: Hollice Espy, MD;  Location: [facility] ORS;  Service: Urology;  Laterality: Left;   CYSTOSCOPY/URETEROSCOPY/HOLMIUM LASER/STENT PLACEMENT Left 11/16/2017   Procedure: CYSTOSCOPY/URETEROSCOPY/HOLMIUM LASER/STENT Exchange;  Surgeon: Hollice Espy, MD;  Location: [facility] ORS;  Service: Urology;  Laterality: Left;   ESOPHAGOGASTRODUODENOSCOPY (EGD) WITH PROPOFOL N/A 04/25/2015   Procedure: ESOPHAGOGASTRODUODENOSCOPY (EGD) WITH PROPOFOL;  Surgeon: Josefine Class, MD;  Location: [facility] ENDOSCOPY;  Service: Endoscopy;  Laterality: N/A;   EYE SURGERY Bilateral 1964   lazy eye repair   EYE SURGERY Bilateral 2001   lazy eye repair   IR IMAGING GUIDED PORT INSERTION  12/08/2020   LITHOTRIPSY  2009   THYROIDECTOMY  2010   URETEROSCOPY WITH HOLMIUM LASER LITHOTRIPSY Right 05/19/2015   Procedure: URETEROSCOPY WITH HOLMIUM LASER LITHOTRIPSY;  Surgeon: Hollice Espy, MD;  Location: [facility] ORS;  Service: Urology;  Laterality: Right;   URETEROSCOPY WITH HOLMIUM LASER LITHOTRIPSY Right 05/26/2015   Procedure: URETEROSCOPY WITH HOLMIUM LASER LITHOTRIPSY;  Surgeon: Hollice Espy, MD;  Location: [facility] ORS;  Service: Urology;  Laterality: Right;   URETEROSCOPY WITH HOLMIUM LASER LITHOTRIPSY Right 06/25/2016   Procedure: URETEROSCOPY WITH HOLMIUM LASER LITHOTRIPSY;  Surgeon: Nickie Retort, MD;  Location: [facility] ORS;  Service: Urology;  Laterality: Right;    Prior to Admission medications   Medication  Sig Start Date End Date Taking? Authorizing Provider  albuterol (VENTOLIN HFA) 108 (90 Base) MCG/ACT inhaler Inhale 2 puffs into the lungs every 6 (six) hours as needed for wheezing or shortness of breath. 05/12/21  Yes Borders, Kirt Boys, NP  amLODipine (NORVASC) 5 MG tablet Take 5 mg by mouth every morning.    Yes [provider]  ARTIFICIAL TEAR OP Apply to eye.   Yes [provider]  atorvastatin (LIPITOR) 80 MG tablet Take 80 mg by mouth at bedtime.   Yes [provider]  benzonatate (TESSALON PERLES) 100 MG capsule Take 1 capsule (100 mg total) by mouth 3 (three) times daily as needed for cough. 09/16/21  Yes Cammie Sickle, MD  buPROPion (WELLBUTRIN SR) 150 MG 12 hr tablet Take 150 mg by mouth daily.   Yes [provider]  cholecalciferol (VITAMIN D) 1000 units tablet Take 2,000 Units by mouth daily.   Yes [provider]  Cranberry-Vitamin C-Probiotic (AZO CRANBERRY PO) Take 1 tablet by mouth daily.   Yes [provider]  fluticasone-salmeterol (ADVAIR HFA) 45-21 MCG/ACT inhaler Inhale 2 puffs into the lungs 2 (two) times daily. 01/28/21  Yes Borders, Kirt Boys, NP  folic acid (FOLVITE) 1 MG tablet Take 1 tablet (1 mg total) by mouth daily. 11/18/21  Yes Cammie Sickle, MD  glipiZIDE (GLUCOTROL) 5 MG tablet Take 1 tablet (5 mg total) by mouth 2 (two) times daily before a meal. 11/18/21  Yes Cammie Sickle, MD  HYDROcodone-acetaminophen (NORCO/VICODIN) 5-325 MG tablet Take 1 tablet by mouth every 8 (eight) hours as needed for moderate pain. 12/07/21  Yes Borders, Kirt Boys, NP  levothyroxine (SYNTHROID) 175 MCG tablet Saturday and sunday 12/02/20  Yes [provider]  levothyroxine (SYNTHROID, LEVOTHROID) 150 MCG tablet Take 150 mcg by mouth at bedtime. Mon-friday   Yes [provider]  lidocaine-prilocaine (EMLA) cream Apply 1 application topically as needed. 12/02/20  Yes Cammie Sickle, MD   loperamide (IMODIUM) 2 MG capsule Take by mouth as needed for diarrhea or loose stools.   Yes [provider]  losartan (COZAAR) 100 MG tablet Take 100 mg by mouth every morning.    Yes [provider]  metoprolol succinate (TOPROL-XL) 25 MG 24 hr tablet Take 25 mg by mouth every morning.    Yes [provider]  montelukast (SINGULAIR) 10 MG tablet Take 1 tablet (10 mg total) by mouth at bedtime. 11/16/21  Yes Cammie Sickle, MD  nystatin (MYCOSTATIN) 100000 UNIT/ML suspension Swish and swallow. Don't drink fluids immediately following administration. 12/16/20  Yes Verlon Au, NP  omeprazole (PRILOSEC) 20 MG capsule Take 20 mg by mouth daily before breakfast.    Yes [provider]  ondansetron (ZOFRAN) 8 MG tablet Take 1 tablet (8 mg total) by mouth every 8 (eight) hours as needed for nausea or vomiting. Start on the third day after chemotherapy 04/28/21  Yes Borders, Kirt Boys, NP  prochlorperazine (COMPAZINE) 10 MG tablet Take 1 tablet (10 mg total) by mouth every 6 (six) hours as needed for nausea or vomiting. 04/28/21  Yes Borders, Kirt Boys, NP  sucralfate (CARAFATE) 1 g tablet Take 1 tablet (1 g total) by mouth 4 (four)  times daily -  with meals and at bedtime. Patient taking differently: Take 1 g by mouth 4 (four) times daily as needed. 09/22/21  Yes Borders, Kirt Boys, NP  Accu-Chek Softclix Lancets lancets Check glucose once a day 01/29/21   Verlon Au, NP  blood glucose meter kit and supplies KIT Dispense based on patient and insurance preference. Use up to three times daily as directed. 12/10/20   Cammie Sickle, MD    Allergies as of 10/27/2021   (No Known Allergies)    Family History  Problem Relation Age of Onset   CAD Mother    Heart disease Mother    Dementia Father    Bladder Cancer Neg Hx    Prostate cancer Neg Hx    Kidney cancer Neg Hx    Breast cancer Neg Hx     Social History   Socioeconomic History   Marital  status: Divorced    Spouse name: Not on file   Number of children: Not on file   Years of education: Not on file   Highest education level: Not on file  Occupational History   Not on file  Tobacco Use   Smoking status: Former    Packs/day: 1.00    Years: 50.00    Total pack years: 50.00    Types: Cigarettes    Quit date: 08/15/2021    Years since quitting: 0.3   Smokeless tobacco: Never   Tobacco comments:    Started smoking age 68  Vaping Use   Vaping Use: Former  Substance and Sexual Activity   Alcohol use: Not Currently    Comment: socially   Drug use: No   Sexual activity: Not Currently    Birth control/protection: None  Other Topics Concern   Not on file  Social History Narrative   Smoking: 1p 1-2 days since 17 years; alcohol: rare; 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     Social Determinants of Health   Financial Resource Strain: Not on file  Food Insecurity: Not on file  Transportation Needs: Unmet Transportation Needs (10/02/2021)   PRAPARE - Hydrologist (Medical): Yes    Lack of Transportation (Non-Medical): Yes  Physical Activity: Not on file  Stress: Not on file  Social Connections: Not on file  Intimate Partner Violence: Not on file    Review of Systems: See HPI, otherwise negative ROS  Physical Exam: BP 135/71   Pulse (!) 102   Temp 97.7 F (36.5 C) (Temporal)   Resp 20   Ht _0  (1.702 m)   Wt 80.7 kg   SpO2 95%   BMI 27.88 kg/m  General:   Alert, cooperative in NAD Head:  Normocephalic and atraumatic. Respiratory:  Normal work of breathing. Cardiovascular:  RRR  Impression/Plan: Felicia Manning is here for cataract surgery.  Risks, benefits, limitations, and alternatives regarding cataract surgery have been reviewed with the patient.  Questions have been answered.  All parties agreeable.   Birder Robson, MD  12/08/2021, 7:53  AM

## 2021-12-08 NOTE — Progress Notes (Signed)
Nutrition Follow-up: Remote nutrition follow up at patient's home telephone#.  She was resting prior to call as she had cataract surgery this morning.  She was due to for follow up at her radiation follow up but appointment was canceled due to fever for 3 days.  She has since been to [place] last week traveling to see mom and wasn't eating breakfast.  She reports some nausea lately but using meds and resolving.  Tolerating all foods with no concerns other that just getting herself back on track.  Still using HB eggs or egg salad as protein source, and has some ONS in fridge but she hasn't been drinking as often.   Medications: reviewed  Labs: reviewed  Anthropometrics: Had been hold weight until past week and lost 7# with skipping early meal. 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 Resolving per patient's response to tolerance of foods   INTERVENTION:  Encouraged asking "have I eaten anything?" each time she puts eye drops in  Encouraged oral nutrition supplements as needed for meal replacement.     MONITORING, EVALUATION, GOAL: weight trends, intake   NEXT VISIT: PRN at patient or provider request  April Manson, RDN, LDN Registered Dietitian, [place] Part Time Remote (Usual office hours: Tuesday-Thursday) Mobile: [phone] Remote Office: [phone]

## 2021-12-08 NOTE — Anesthesia Preprocedure Evaluation (Signed)
Anesthesia Evaluation  Patient identified by MRN, date of birth, ID band Patient awake    Reviewed: Allergy & Precautions, NPO status , Patient's Chart, lab work & pertinent test results  History of Anesthesia Complications Negative for: history of anesthetic complications  Airway Mallampati: III  TM Distance: >3 FB Neck ROM: full    Dental  (+) Chipped, Dental Advidsory Given   Pulmonary neg shortness of breath, neg sleep apnea, COPD,  COPD inhaler, former smoker,    Pulmonary exam normal breath sounds clear to auscultation- rhonchi (-) wheezing      Cardiovascular Exercise Tolerance: Good hypertension, Pt. on medications (-) CAD, (-) Past MI, (-) Cardiac Stents and (-) CABG negative cardio ROS Normal cardiovascular exam Rhythm:Regular Rate:Normal - Systolic murmurs and - Diastolic murmurs    Neuro/Psych negative neurological ROS  negative psych ROS   GI/Hepatic negative GI ROS, (+) Hepatitis -, C  Endo/Other  diabetesHypothyroidism   Renal/GU Renal disease (nephrolithiasis)     Musculoskeletal negative musculoskeletal ROS (+)   Abdominal (+) - obese,   Peds  Hematology negative hematology ROS (+)   Anesthesia Other Findings Patient with stage 4 lung cancer that was diagnosed this month. Patient states she is breathing well and with no problems. She has an inhaler that she uses everyday. Denies any shortness of breath. States that she is coughing a lot when she lays flat. Discussed case with surgeon and notified him of coughing.   Past Medical History: No date: Barrett's esophagus No date: COPD (chronic obstructive pulmonary disease) (HCC) No date: Dysrhythmia     Comment:  stress related at work. 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  Usually only wears upper  Past Surgical History: 2006: ABDOMINAL HYSTERECTOMY 04/25/2015: COLONOSCOPY WITH PROPOFOL; N/A     Comment:  Procedure: COLONOSCOPY WITH PROPOFOL;  Surgeon: Josefine Class, MD;  Location: [facility] ENDOSCOPY;  Service:               Endoscopy;  Laterality: N/A; 05/19/2015: CYSTOSCOPY W/ RETROGRADES; Bilateral     Comment:  Procedure: CYSTOSCOPY WITH RETROGRADE PYELOGRAM;                Surgeon: Hollice Espy, MD;  Location: [facility] ORS;                Service: Urology;  Laterality: Bilateral; 05/26/2015: CYSTOSCOPY W/ URETERAL STENT PLACEMENT; Right     Comment:  Procedure: CYSTOSCOPY WITH STENT REPLACEMENT;  Surgeon:               Hollice Espy, MD;  Location: [facility] ORS;  Service:               Urology;  Laterality: Right; 05/19/2015: CYSTOSCOPY WITH STENT PLACEMENT; Right     Comment:  Procedure: CYSTOSCOPY WITH STENT PLACEMENT;  Surgeon:  Hollice Espy, MD;  Location: [facility] ORS;  Service:               Urology;  Laterality: Right; 05/26/2015: CYSTOSCOPY WITH STENT PLACEMENT; Right     Comment:  Procedure: CYSTOSCOPY WITH STENT PLACEMENT;  Surgeon:               Hollice Espy, MD;  Location: [facility] ORS;  Service:               Urology;  Laterality: Right; 06/25/2016: CYSTOSCOPY WITH STENT PLACEMENT; Right     Comment:  Procedure: [place];  Surgeon:               Nickie Retort, MD;  Location: [facility] ORS;  Service:               Urology;  Laterality: Right; 10/31/2017: CYSTOSCOPY WITH STENT PLACEMENT; Left     Comment:   Procedure: CYSTOSCOPY WITH STENT PLACEMENT;  Surgeon:               Hollice Espy, MD;  Location: [facility] ORS;  Service:               Urology;  Laterality: Left; 07/23/2015: CYSTOSCOPY/URETEROSCOPY/HOLMIUM LASER/STENT PLACEMENT; Left     Comment:  Procedure: CYSTOSCOPY/URETEROSCOPY/HOLMIUM LASER/STENT               PLACEMENT/RETROGRADE PYELOGRAM;  Surgeon: Hollice Espy,              MD;  Location: [facility] ORS;  Service: Urology;  Laterality:               Left; 11/16/2017: CYSTOSCOPY/URETEROSCOPY/HOLMIUM LASER/STENT PLACEMENT; Left     Comment:  Procedure: CYSTOSCOPY/URETEROSCOPY/HOLMIUM LASER/STENT               Exchange;  Surgeon: Hollice Espy, MD;  Location: [facility]               ORS;  Service: Urology;  Laterality: Left; 04/25/2015: ESOPHAGOGASTRODUODENOSCOPY (EGD) WITH PROPOFOL; N/A     Comment:  Procedure: ESOPHAGOGASTRODUODENOSCOPY (EGD) WITH               PROPOFOL;  Surgeon: Josefine Class, MD;  Location:               [facility] ENDOSCOPY;  Service: Endoscopy;  Laterality: N/A; 1964: EYE SURGERY; Bilateral     Comment:  lazy eye repair 2001: EYE SURGERY; Bilateral     Comment:  lazy eye repair 12/08/2020: IR IMAGING GUIDED PORT INSERTION 2009: LITHOTRIPSY 2010: THYROIDECTOMY 05/19/2015: URETEROSCOPY WITH HOLMIUM LASER LITHOTRIPSY; Right     Comment:  Procedure: URETEROSCOPY WITH HOLMIUM LASER LITHOTRIPSY;               Surgeon: Hollice Espy, MD;  Location: [facility] ORS;                Service: Urology;  Laterality: Right; 05/26/2015: URETEROSCOPY WITH HOLMIUM LASER LITHOTRIPSY; Right     Comment:  Procedure: URETEROSCOPY WITH HOLMIUM LASER LITHOTRIPSY;               Surgeon: Hollice Espy, MD;  Location: [facility] ORS;                Service: Urology;  Laterality: Right; 06/25/2016: URETEROSCOPY WITH HOLMIUM LASER LITHOTRIPSY; Right     Comment:  Procedure: URETEROSCOPY WITH HOLMIUM LASER LITHOTRIPSY;               Surgeon: Nickie Retort, MD;  Location: [facility] ORS;  Service: Urology;  Laterality: Right;  BMI    Body Mass Index: 27.88 kg/m      Reproductive/Obstetrics negative OB ROS                             Anesthesia Physical  Anesthesia Plan  ASA: 4  Anesthesia Plan: MAC   Post-op Pain Management:    Induction: Intravenous  PONV Risk Score and Plan: 1 and Ondansetron and Midazolam  Airway Management Planned: Natural Airway and Nasal Cannula  Additional Equipment:   Intra-op Plan:   Post-operative Plan: Extubation in OR  Informed Consent: I have reviewed the patients History and Physical, chart, labs and discussed the procedure including the risks, benefits and alternatives for the proposed anesthesia with the patient or authorized representative who has indicated his/her understanding and acceptance.     Dental Advisory Given  Plan Discussed with: Anesthesiologist, CRNA and Surgeon  Anesthesia Plan Comments: (Patient consented for risks of anesthesia including but not limited to:  - adverse reactions to medications - damage to eyes, teeth, lips or other oral mucosa - nerve damage due to positioning  - sore throat or hoarseness - Damage to heart, brain, nerves, lungs, other parts of body or loss of life  Patient voiced understanding.)        Anesthesia Quick Evaluation

## 2021-12-08 NOTE — Op Note (Signed)
PREOPERATIVE DIAGNOSIS:  Nuclear sclerotic cataract of the right eye.   POSTOPERATIVE DIAGNOSIS:  H25.11 Cataract   OPERATIVE PROCEDURE:ORPROCALL@   SURGEON:  Birder Robson, MD.   ANESTHESIA:  Anesthesiologist: Dimas Millin, MD CRNA: Jacqualin Combes, CRNA  1.      Managed anesthesia care. 2.      0.90ml of Shugarcaine was instilled in the eye following the paracentesis.   COMPLICATIONS:  None.   TECHNIQUE:   Stop and chop   DESCRIPTION OF PROCEDURE:  The patient was examined and consented in the preoperative holding area where the aforementioned topical anesthesia was applied to the right eye and then brought back to the Operating Room where the right eye was prepped and draped in the usual sterile ophthalmic fashion and a lid speculum was placed. A paracentesis was created with the side port blade and the anterior chamber was filled with viscoelastic. A near clear corneal incision was performed with the steel keratome. A continuous curvilinear capsulorrhexis was performed with a cystotome followed by the capsulorrhexis forceps. Hydrodissection and hydrodelineation were carried out with BSS on a blunt cannula. The lens was removed in a stop and chop  technique and the remaining cortical material was removed with the irrigation-aspiration handpiece. The capsular bag was inflated with viscoelastic and the Technis [id number]  lens was placed in the capsular bag without complication. The remaining viscoelastic was removed from the eye with the irrigation-aspiration handpiece. The wounds were hydrated. The anterior chamber was flushed with BSS and the eye was inflated to physiologic pressure. 0.59ml of Vigamox was placed in the anterior chamber. The wounds were found to be water tight. The eye was dressed with Combigan. The patient was given protective glasses to wear throughout the day and a shield with which to sleep tonight. The patient was also given drops with which to begin a drop regimen  today and will follow-up with me in one day. Implant Name Type Inv. Item Serial No. Manufacturer Lot No. LRB No. Used Action  LENS IOL TECNIS EYHANCE 33.0 - [id number] Intraocular Lens LENS IOL TECNIS EYHANCE 33.0 [id number] [employer]  Right 1 Implanted   Procedure(s) with comments: CATARACT EXTRACTION PHACO AND INTRAOCULAR LENS PLACEMENT (IOC) RIGHT DIABETIC 6.62 00:35.1 (Right) - Diabetic  Electronically signed: Birder Robson 12/08/2021 8:18 AM

## 2021-12-08 NOTE — Anesthesia Postprocedure Evaluation (Signed)
Anesthesia Post Note  Patient: [name]  Procedure(s) Performed: CATARACT EXTRACTION PHACO AND INTRAOCULAR LENS PLACEMENT (IOC) RIGHT DIABETIC 6.62 00:35.1 (Right: Eye)  Patient location during evaluation: PACU Anesthesia Type: MAC Level of consciousness: awake and alert Pain management: pain level controlled Vital Signs Assessment: post-procedure vital signs reviewed and stable Respiratory status: spontaneous breathing, nonlabored ventilation, respiratory function stable and patient connected to nasal cannula oxygen Cardiovascular status: blood pressure returned to baseline and stable Postop Assessment: no apparent nausea or vomiting Anesthetic complications: no   No notable events documented.   Last Vitals:  Vitals:   12/08/21 0819 12/08/21 0825  BP: 121/78 123/70  Pulse: 100 95  Resp: 18 16  Temp: (!) 36.4 C (!) 36.4 C  SpO2: 95% 95%    Last Pain:  Vitals:   12/08/21 0825  TempSrc:   PainSc: 0-No pain                 Dimas Millin

## 2021-12-09 ENCOUNTER — Encounter: Payer: Self-pay | Admitting: Ophthalmology

## 2021-12-10 ENCOUNTER — Other Ambulatory Visit: Payer: Self-pay | Admitting: Radiology

## 2021-12-10 DIAGNOSIS — Z01818 Encounter for other preprocedural examination: Secondary | ICD-10-CM

## 2021-12-10 NOTE — Progress Notes (Signed)
Patient for US guided Liver Biopsy on Fri 12/11/21, I called and spoke with the patient on the phone and gave pre-procedure instructions. Pt was made aware to be here at 10a at the new entrance, NPO after MN prior to procedure as well as driver post procedure/recovery/discharge. Pt stated understanding. Called 12/10/21

## 2021-12-11 ENCOUNTER — Ambulatory Visit: Payer: 59

## 2021-12-11 ENCOUNTER — Ambulatory Visit
Admission: RE | Admit: 2021-12-11 | Discharge: 2021-12-11 | Disposition: A | Discharge status: Home / Self Care | Payer: 59 | Source: Ambulatory Visit | Attending: Internal Medicine | Admitting: Internal Medicine

## 2021-12-11 ENCOUNTER — Other Ambulatory Visit: Payer: Self-pay

## 2021-12-11 DIAGNOSIS — I10 Essential (primary) hypertension: Secondary | ICD-10-CM | POA: Insufficient documentation

## 2021-12-11 DIAGNOSIS — Z85118 Personal history of other malignant neoplasm of bronchus and lung: Secondary | ICD-10-CM | POA: Diagnosis not present

## 2021-12-11 DIAGNOSIS — C787 Secondary malignant neoplasm of liver and intrahepatic bile duct: Secondary | ICD-10-CM | POA: Insufficient documentation

## 2021-12-11 DIAGNOSIS — Z85850 Personal history of malignant neoplasm of thyroid: Secondary | ICD-10-CM | POA: Diagnosis not present

## 2021-12-11 DIAGNOSIS — K769 Liver disease, unspecified: Secondary | ICD-10-CM

## 2021-12-11 DIAGNOSIS — R160 Hepatomegaly, not elsewhere classified: Secondary | ICD-10-CM | POA: Insufficient documentation

## 2021-12-11 DIAGNOSIS — J449 Chronic obstructive pulmonary disease, unspecified: Secondary | ICD-10-CM | POA: Diagnosis not present

## 2021-12-11 DIAGNOSIS — C3432 Malignant neoplasm of lower lobe, left bronchus or lung: Secondary | ICD-10-CM

## 2021-12-11 DIAGNOSIS — K219 Gastro-esophageal reflux disease without esophagitis: Secondary | ICD-10-CM | POA: Diagnosis not present

## 2021-12-11 DIAGNOSIS — Z01818 Encounter for other preprocedural examination: Secondary | ICD-10-CM

## 2021-12-11 LAB — CBC
HCT: 36.4 % (ref 36.0–46.0)
Hemoglobin: 11.2 g/dL — ABNORMAL LOW (ref 12.0–15.0)
MCH: 31.3 pg (ref 26.0–34.0)
MCHC: 30.8 g/dL (ref 30.0–36.0)
MCV: 101.7 fL — ABNORMAL HIGH (ref 80.0–100.0)
Platelets: 208 10*3/uL (ref 150–400)
RBC: 3.58 MIL/uL — ABNORMAL LOW (ref 3.87–5.11)
RDW: 18 % — ABNORMAL HIGH (ref 11.5–15.5)
WBC: 8.9 10*3/uL (ref 4.0–10.5)
nRBC: 0 % (ref 0.0–0.2)

## 2021-12-11 LAB — GLUCOSE, CAPILLARY: Glucose-Capillary: 134 mg/dL — ABNORMAL HIGH (ref 70–99)

## 2021-12-11 LAB — PROTIME-INR
INR: 1 (ref 0.8–1.2)
Prothrombin Time: 13.1 seconds (ref 11.4–15.2)

## 2021-12-11 MED ORDER — FENTANYL CITRATE (PF) 100 MCG/2ML IJ SOLN
INTRAMUSCULAR | Status: AC
  Filled 2021-12-11: qty 2

## 2021-12-11 MED ORDER — SODIUM CHLORIDE 0.9 % IV SOLN: INTRAVENOUS | Status: DC

## 2021-12-11 MED ORDER — LIDOCAINE HCL (PF) 1 % IJ SOLN
10.0000 mL | Freq: Once | INTRAMUSCULAR | Status: AC
  Administered 2021-12-11: 10 mL via SUBCUTANEOUS
  Filled 2021-12-11: qty 10

## 2021-12-11 MED ORDER — FENTANYL CITRATE (PF) 100 MCG/2ML IJ SOLN
INTRAMUSCULAR | Status: AC | PRN
  Administered 2021-12-11 (×2): 25 ug via INTRAVENOUS

## 2021-12-11 MED ORDER — MIDAZOLAM HCL 2 MG/2ML IJ SOLN
INTRAMUSCULAR | Status: AC
  Filled 2021-12-11: qty 2

## 2021-12-11 MED ORDER — MIDAZOLAM HCL 2 MG/2ML IJ SOLN
INTRAMUSCULAR | Status: AC | PRN
  Administered 2021-12-11: .5 mg via INTRAVENOUS
  Administered 2021-12-11: 1 mg via INTRAVENOUS

## 2021-12-11 NOTE — H&P (Signed)
Chief Complaint: Patient was seen in consultation today for liver lesions  Referring Physician(s): Cammie Sickle  Supervising Physician: Juliet Rude  Patient Status: [facility] - Out-pt  History of Present Illness: Felicia Manning is a 66 y.o. female with PMH of COPD, GERD, hypertension, non-small cell carcinoma of left lung, and thyroid cancer being seen today in relation to new liver lesions. Patient underwent CT Chest/Abdomen/Pelvis w Contrast on 11/20/21 which revealed multiple new liver lesions consistent with metastatic disease. At this point, IR was consulted to perform image-guided biopsy of liver masses.  Past Medical History:  Diagnosis Date   Barrett's esophagus    COPD (chronic obstructive pulmonary disease) ([place])    Dysrhythmia    stress related at work.   GERD (gastroesophageal reflux disease)    Hepatitis C 2008   [place] 2017   History of kidney stones    Hyperlipidemia    Hypertension    Hypothyroidism    Liver cancer ([place])    metastasized from Lung   Nephrolithiasis    Non-small cell carcinoma of left lung ([place])    Osteoporosis    Pulmonary mass    Thyroid cancer ([place]) 2012   Thyroid cancer ([place])    Tumor    tumor per pt back of rt eye   Type 2 diabetes mellitus ([place])    Wears dentures    Full upper and lower.  Usually only wears upper    Past Surgical History:  Procedure Laterality Date   ABDOMINAL HYSTERECTOMY  2006   CATARACT EXTRACTION W/PHACO Right 12/08/2021   Procedure: CATARACT EXTRACTION PHACO AND INTRAOCULAR LENS PLACEMENT (IOC) RIGHT DIABETIC 6.62 00:35.1;  Surgeon: Birder Robson, MD;  Location: [facility];  Service: Ophthalmology;  Laterality: Right;  Diabetic   COLONOSCOPY WITH PROPOFOL N/A 04/25/2015   Procedure: COLONOSCOPY WITH PROPOFOL;  Surgeon: Josefine Class, MD;  Location: [facility] ENDOSCOPY;  Service: Endoscopy;  Laterality: N/A;   CYSTOSCOPY W/ RETROGRADES Bilateral 05/19/2015   Procedure: CYSTOSCOPY  WITH RETROGRADE PYELOGRAM;  Surgeon: Hollice Espy, MD;  Location: [facility] ORS;  Service: Urology;  Laterality: Bilateral;   CYSTOSCOPY W/ URETERAL STENT PLACEMENT Right 05/26/2015   Procedure: CYSTOSCOPY WITH STENT REPLACEMENT;  Surgeon: Hollice Espy, MD;  Location: [facility] OR[facility];  Service: Urology;  Laterality: Right;   CYSTOSCOPY WITH STENT PLACEMENT Right 05/19/2015   Procedure: CYSTOSCOPY WITH STENT PLACEMENT;  Surgeon: Hollice Espy, MD;  Location: [facility] OR[facility];  Service: Urology;  Laterality: Right;   CYSTOSCOPY WITH STENT PLACEMENT Right 05/26/2015   Procedure: CYSTOSCOPY WITH STENT PLACEMENT;  Surgeon: Hollice Espy, MD;  Location: [facility];  Service: Urology;  Laterality: Right;   CYSTOSCOPY WITH STENT PLACEMENT Right 06/25/2016   Procedure: CYSTOSCOPY WITH STENT PLACEMENT;  Surgeon: Nickie Retort, MD;  Location: [facility];  Service: Urology;  Laterality: Right;   CYSTOSCOPY WITH STENT PLACEMENT Left 10/31/2017   Procedure: CYSTOSCOPY WITH STENT PLACEMENT;  Surgeon: Hollice Espy, MD;  Location: [facility];  Service: Urology;  Laterality: Left;   CYSTOSCOPY/URETEROSCOPY/HOLMIUM LASER/STENT PLACEMENT Left 07/23/2015   Procedure: CYSTOSCOPY/URETEROSCOPY/HOLMIUM LASER/STENT PLACEMENT/RETROGRADE PYELOGRAM;  Surgeon: Hollice Espy, MD;  Location: [facility];  Service: Urology;  Laterality: Left;   CYSTOSCOPY/URETEROSCOPY/HOLMIUM LASER/STENT PLACEMENT Left 11/16/2017   Procedure: CYSTOSCOPY/URETEROSCOPY/HOLMIUM LASER/STENT Exchange;  Surgeon: Hollice Espy, MD;  Location: [facility];  Service: Urology;  Laterality: Left;   ESOPHAGOGASTRODUODENOSCOPY (EGD) WITH PROPOFOL N/A 04/25/2015   Procedure: ESOPHAGOGASTRODUODENOSCOPY (EGD) WITH PROPOFOL;  Surgeon: Josefine Class, MD;  Location[facility] END[facility]COPY;  Service: Endoscopy;  Laterality: N/A;  EYE SURGERY Bilateral 1964   lazy eye repair   EYE SURGERY Bilateral 2001   lazy eye repair   IR IMAGING GUIDED PORT INSERTION  12/08/2020   LITHOTRIPSY  2009    THYROIDECTOMY  2010   URETEROSCOPY WITH HOLMIUM LASER LITHOTRIPSY Right 05/19/2015   Procedure: URETEROSCOPY WITH HOLMIUM LASER LITHOTRIPSY;  Surgeon: Hollice Espy, MD;  Location: [facility];  Service: Urology;  Laterality: Right;   URETEROSCOPY WITH HOLMIUM LASER LITHOTRIPSY Right 05/26/2015   Procedure: URETEROSCOPY WITH HOLMIUM LASER LITHOTRIPSY;  Surgeon: Hollice Espy, MD;  Location: [facility] OR[facility];  Service: Urology;  Laterality: Right;   URETEROSCOPY WITH HOLMIUM LASER LITHOTRIPSY Right 06/25/2016   Procedure: URETEROSCOPY WITH HOLMIUM LASER LITHOTRIPSY;  Surgeon: Nickie Retort, MD;  Location: [facility] OR[facility];  Service: Urology;  Laterality: Right;    Allergies: Patient has no known allergies.  Medications: Prior to Admission medications   Medication Sig Start Date End Date Taking? Authorizing Provider  albuterol (VENTOLIN HFA) 108 (90 Base) MCG/ACT inhaler Inhale 2 puffs into the lungs every 6 (six) hours as needed for wheezing or shortness of breath. 05/12/21  Yes Borders, Kirt Boys, NP  amLODipine (NORVASC) 5 MG tablet Take 5 mg by mouth every morning.    Yes [provider]  ARTIFICIAL TEAR OP Apply to eye.   Yes [provider]  atorvastatin (LIPITOR) 80 MG tablet Take 80 mg by mouth at bedtime.   Yes [provider]  benzonatate (TESSALON PERLES) 100 MG capsule Take 1 capsule (100 mg total) by mouth 3 (three) times daily as needed for cough. 09/16/21  Yes Cammie Sickle, MD  buPROPion (WELLBUTRIN SR) 150 MG 12 hr tablet Take 150 mg by mouth daily.   Yes [provider]  cholecalciferol (VITAMIN D) 1000 units tablet Take 2,000 Units by mouth daily.   Yes [provider]  Cranberry-Vitamin C-Probiotic (AZO CRANBERRY PO) Take 1 tablet by mouth daily.   Yes [provider]  fluticasone-salmeterol (ADVAIR HFA) 45-21 MCG/ACT inhaler Inhale 2 puffs into the lungs 2 (two) times daily. 01/28/21  Yes Borders, Kirt Boys, NP  folic acid (FOLVITE)  1 MG tablet Take 1 tablet (1 mg total) by mouth daily. 11/18/21  Yes Cammie Sickle, MD  glipiZIDE (GLUCOTROL) 5 MG tablet Take 1 tablet (5 mg total) by mouth 2 (two) times daily before a meal. 11/18/21  Yes Cammie Sickle, MD  HYDROcodone-acetaminophen (NORCO/VICODIN) 5-325 MG tablet Take 1 tablet by mouth every 8 (eight) hours as needed for moderate pain. 12/07/21  Yes Borders, Kirt Boys, NP  levothyroxine (SYNTHROID, LEVOTHROID) 150 MCG tablet Take 150 mcg by mouth at bedtime. Mon-friday   Yes [provider]  lidocaine-prilocaine (EMLA) cream Apply 1 application topically as needed. 12/02/20  Yes Cammie Sickle, MD  loperamide (IMODIUM) 2 MG capsule Take by mouth as needed for diarrhea or loose stools.   Yes [provider]  losartan (COZAAR) 100 MG tablet Take 100 mg by mouth every morning.    Yes [provider]  metoprolol succinate (TOPROL-XL) 25 MG 24 hr tablet Take 25 mg by mouth every morning.    Yes [provider]  montelukast (SINGULAIR) 10 MG tablet Take 1 tablet (10 mg total) by mouth at bedtime. 11/16/21  Yes Cammie Sickle, MD  omeprazole (PRILOSEC) 20 MG capsule Take 20 mg by mouth daily before breakfast.    Yes [provider]  ondansetron (ZOFRAN) 8 MG tablet Take 1 tablet (8 mg total) by  mouth every 8 (eight) hours as needed for nausea or vomiting. Start on the third day after chemotherapy 04/28/21  Yes Borders, Kirt Boys, NP  prochlorperazine (COMPAZINE) 10 MG tablet Take 1 tablet (10 mg total) by mouth every 6 (six) hours as needed for nausea or vomiting. 04/28/21  Yes Borders, Kirt Boys, NP  sucralfate (CARAFATE) 1 g tablet Take 1 tablet (1 g total) by mouth 4 (four) times daily -  with meals and at bedtime. Patient taking differently: Take 1 g by mouth 4 (four) times daily as needed. 09/22/21  Yes Borders, Kirt Boys, NP  Accu-Chek Softclix Lancets lancets Check glucose once a day 01/29/21   Verlon Au, NP   blood glucose meter kit and supplies KIT Dispense based on patient and insurance preference. Use up to three times daily as directed. 12/10/20   Cammie Sickle, MD  levothyroxine (SYNTHROID) 175 MCG tablet Saturday and sunday 12/02/20   [provider]  nystatin (MYCOSTATIN) 100000 UNIT/ML suspension Swish and swallow. Don't drink fluids immediately following administration. 12/16/20   Verlon Au, NP     Family History  Problem Relation Age of Onset   CAD Mother    Heart disease Mother    Dementia Father    Bladder Cancer Neg Hx    Prostate cancer Neg Hx    Kidney cancer Neg Hx    Breast cancer Neg Hx     Social History   Socioeconomic History   Marital status: Divorced    Spouse name: Not on file   Number of children: Not on file   Years of education: Not on file   Highest education level: Not on file  Occupational History   Not on file  Tobacco Use   Smoking status: Former    Packs/day: 1.00    Years: 50.00    Total pack years: 50.00    Types: Cigarettes    Quit date: 08/15/2021    Years since quitting: 0.3   Smokeless tobacco: Never   Tobacco comments:    Started smoking age 42  Vaping Use   Vaping Use: Former  Substance and Sexual Activity   Alcohol use: Not Currently    Comment: socially   Drug use: No   Sexual activity: Not Currently    Birth control/protection: None  Other Topics Concern   Not on file  Social History Narrative   Smoking: 1p 1-2 days since 17 years; alcohol: rare; 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     Social Determinants of Health   Financial Resource Strain: Not on file  Food Insecurity: Not on file  Transportation Needs: Unmet Transportation Needs (10/02/2021)   PRAPARE - Hydrologist (Medical): Yes    Lack of Transportation (Non-Medical): Yes  Physical Activity: Not on file  Stress: Not on file  Social  Connections: Not on file    Review of Systems: A 12 point ROS discussed and pertinent positives are indicated in the HPI above.  All other systems are negative.  Review of Systems  Constitutional:  Negative for chills and fatigue.  Respiratory:  Negative for chest tightness and shortness of breath.   Cardiovascular:  Negative for chest pain and leg swelling.  Gastrointestinal:  Negative for diarrhea, nausea and vomiting.  Neurological:  Negative for dizziness and headaches.  Psychiatric/Behavioral:  Negative for confusion.     Vital Signs: BP 112/67   Pulse 84  Temp 97.8 F (36.6 C) (Oral)   Resp 16   Ht _0  (1.702 m)   Wt 178 lb (80.7 kg)   SpO2 93%   BMI 27.88 kg/m     Physical Exam Vitals reviewed.  Constitutional:      General: She is not in acute distress. HENT:     Mouth/Throat:     Mouth: Mucous membranes are moist.  Cardiovascular:     Rate and Rhythm: Normal rate and regular rhythm.     Pulses: Normal pulses.     Heart sounds: Normal heart sounds.  Pulmonary:     Effort: Pulmonary effort is normal.     Breath sounds: Normal breath sounds.  Abdominal:     General: Bowel sounds are normal.     Palpations: Abdomen is soft.     Tenderness: There is no abdominal tenderness.  Skin:    General: Skin is warm and dry.  Neurological:     Mental Status: She is alert and oriented to person, place, and time.  Psychiatric:        Mood and Affect: Mood normal.        Behavior: Behavior normal.     Imaging: NM Bone Scan Whole Body  Result Date: 12/04/2021 CLINICAL DATA:  Non-small cell lung cancer. Recurrent lung cancer with hip pain. No history of fracture or recent trauma. EXAM: NUCLEAR MEDICINE WHOLE BODY BONE SCAN TECHNIQUE: Whole body anterior and posterior images were obtained approximately 3 hours after intravenous injection of radiopharmaceutical. RADIOPHARMACEUTICALS:  19.7 mCi Technetium-70mMDP IV COMPARISON:  CT chest abdomen and pelvis 11/19/2021.  Pelvis and hip radiographs 10/29/2021. PET-CT 07/23/2021 FINDINGS: Foci of mildly increased activity are demonstrated in the anterolateral left ninth rib, corresponding to an apparent old fracture on the recent CT. Another focus of mildly increased activity is demonstrated in the posterior right ninth rib at the costovertebral junction. No corresponding radiographic abnormality is demonstrated suggesting that this likely represents degenerative change. No definitive metastatic bone uptake is identified. IMPRESSION: 1. No scintigraphic evidence of bone metastasis. Electronically Signed   By: WLucienne CapersM.D.   On: 12/04/2021 20:03   CT CHEST ABDOMEN PELVIS W CONTRAST  Result Date: 11/20/2021 CLINICAL DATA:  Metastatic left lower lobe non-small cell lung cancer restaging, assess treatment response, ongoing chemotherapy * Tracking Code: BO * EXAM: CT CHEST, ABDOMEN, AND PELVIS WITH CONTRAST TECHNIQUE: Multidetector CT imaging of the chest, abdomen and pelvis was performed following the standard protocol during bolus administration of intravenous contrast. RADIATION DOSE REDUCTION: This exam was performed according to the departmental dose-optimization program which includes automated exposure control, adjustment of the mA and/or kV according to patient size and/or use of iterative reconstruction technique. CONTRAST:  1070mISOVUE-300 IOPAMIDOL (ISOVUE-300) INJECTION 61% additional oral enteric contrast COMPARISON:  PET-CT, 07/23/2021, CT chest abdomen pelvis, 07/14/2021 FINDINGS: CT CHEST FINDINGS Cardiovascular: Right chest port catheter. Aortic atherosclerosis. 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 Normal heart size. Scattered left and right coronary artery calcifications. No pericardial effusion. Mediastinum/Nodes: No persistently enlarged mediastinal, hilar, or axillary lymph nodes. Small hiatal hernia. Thyroid gland, trachea, and esophagus demonstrate no  significant findings. Lungs/Pleura: New consolidation of the dependent left lung base, with adjacent bandlike scarring or atelectasis, which almost completely obscures a previously seen nodule of the dependent left lung base. Although difficult to definitively compare to prior examination, area of interest is approximately 2.7 x 1.7 cm in axial dimension, previously 2.5 x 1.9 cm (series 6,  image 53) although clearly flattened and diminished in volume compared to prior examination, best appreciated on sagittal series (series 5, image 163). Numerous scattered bilateral, generally amorphous ground-glass nodules throughout the lungs are unchanged, for example in the peripheral right upper lobe measuring 1.1 x 0.9 cm (series 6, image 30) and in the dependent right lower lobe measuring 0.9 x 0.7 cm (series 6, image 70). Diffuse bilateral bronchial wall thickening. No pleural effusion or pneumothorax. Musculoskeletal: No chest wall abnormality. No acute osseous findings. CT ABDOMEN PELVIS FINDINGS Hepatobiliary: New hypodense liver lesions, including of the posterior inferior right lobe of the liver measuring 1.9 x 1.5 cm (series 2, image 69), in the posterior liver dome, hepatic segment VII measuring 1.1 x 1.1 cm (series 2, image 47), in the inferior left lobe of the liver, hepatic segment III measuring 1.4 x 1.3 cm (series 2, image 56) and in the tip of the left lobe of the liver 1.0 x 0.8 cm (series 2, image 47). Additional simple, benign cyst of the central right lobe of the liver, for which no further follow-up or characterization is required (series 2, image 55). Faintly calcified gallstones (series 2, image 65). No gallbladder wall thickening, or biliary dilatation. Pancreas: Unremarkable. No pancreatic ductal dilatation or surrounding inflammatory changes. Spleen: Normal in size without significant abnormality. Adrenals/Urinary Tract: Adrenal glands are unremarkable. Multiple small bilateral nonobstructive renal  calculi. Multiple bilateral benign renal cortical cysts, for which no further follow-up or characterization is required. No hydronephrosis. Small foci of air in the urinary bladder, likely secondary to recent catheterization. Stomach/Bowel: Stomach is within normal limits. Diverticulum of the descending portion of the duodenum. Appendix appears normal. No evidence of bowel wall thickening, distention, or inflammatory changes. Descending and sigmoid diverticulosis. Vascular/Lymphatic: Aortic atherosclerosis. No enlarged abdominal or pelvic lymph nodes. Reproductive: Status post hysterectomy. Probable seroma or lymphocele status post right oophorectomy in the vicinity of the right ovarian vein, unchanged, measuring 3.5 x 2.5 cm (series 2, image 92). Other: No abdominal wall hernia or abnormality. No ascites. Musculoskeletal: No acute osseous findings. IMPRESSION: 1. Multiple new hypodense liver lesions, consistent with new hepatic metastatic disease. 2. New consolidation of the dependent left lung base, with adjacent bandlike scarring or atelectasis, which almost completely obscures a previously seen nodule of the dependent left lung base. This nodule is clearly flattened and diminished in volume, consistent with treatment response to chemotherapy and radiation. 3. Interval resolution of mediastinal and hilar lymphadenopathy, previously FDG avid, consistent with treatment response of nodal metastatic disease. 4. Numerous scattered bilateral, generally amorphous ground-glass nodules throughout the lungs are unchanged, nonspecific, possibly infectious or inflammatory although indolent adenocarcinoma not excluded. Attention on follow-up. 5. Coronary artery disease. 6. Cholelithiasis. Aortic Atherosclerosis (ICD10-I70.0). Electronically Signed   By: Delanna Ahmadi M.D.   On: 11/20/2021 16:43    Labs:  CBC: Recent Labs    10/07/21 0847 10/28/21 0933 11/18/21 0844 12/11/21 1109  WBC 7.8 7.2 6.7 8.9  HGB 11.2*  11.9* 11.1* 11.2*  HCT 35.4* 37.5 35.2* 36.4  PLT 225 227 216 208    COAGS: Recent Labs    12/11/21 1109  INR 1.0    BMP: Recent Labs    09/16/21 0843 10/07/21 0847 10/28/21 0933 11/18/21 0844  NA 139 138 138 137  K 4.0 3.6 3.9 3.8  CL 103 105 107 105  CO2 _0 GLUCOSE 129* 133* 124* 164*  BUN _1 CALCIUM 9.2 8.5* 8.6* 8.4*  CREATININE  0.88 0.83 0.86 0.84  GFRNONAA >60 >60 >60 >60    LIVER FUNCTION TESTS: Recent Labs    09/16/21 0843 10/07/21 0847 10/28/21 0933 11/18/21 0844  BILITOT 0.4 0.6 0.4 0.4  AST _0 ALT _1 ALKPHOS 66 59 71 68  PROT 7.0 6.7 6.8 6.8  ALBUMIN 3.7 3.5 3.6 3.4*    TUMOR MARKERS: No results for input(s): "AFPTM", "CEA", "CA199", "CHROMGRNA" in the last 8760 hours.  Assessment and Plan:  Raylynn Hersh is a 65 yo female being seen today for an ultrasound-guided liver biopsy. She has a known history of lung cancer, and recent imaging revealed possible metastasis to liver. Case was reviewed and approved by Dr Serafina Royals, and is set to proceed on 12/11/21 with Dr Denna Haggard.  Risks and benefits of ultrasound-guided liver biopsy was discussed with the patient and/or patient's family including, but not limited to bleeding, infection, damage to adjacent structures or low yield requiring additional tests.  All of the questions were answered and there is agreement to proceed.  Consent signed and in chart.   Thank you for this interesting consult.  I greatly enjoyed meeting Viviann Broyles and look forward to participating in their care.  A copy of this report was sent to the requesting provider on this date.  Electronically Signed: Lura Em, PA-C 12/11/2021, 11:57 AM   I spent a total of    25 Minutes in face to face in clinical consultation, greater than 50% of which was counseling/coordinating care for ultrasound-guided liver biopsy.

## 2021-12-11 NOTE — Procedures (Signed)
Interventional Radiology Procedure Note  Date of Procedure: 12/11/2021  Procedure: US liver lesion core needle biopsy   Findings:  1. [place] core needle biopsy of left lobe lesion, 18ga x3 passes    Complications: No immediate complications noted.   Estimated Blood Loss: minimal  Follow-up and Recommendations: 1. Bedrest  2 hours    Albin Felling, MD  Vascular & Interventional Radiology  12/11/2021 1:14 PM

## 2021-12-15 ENCOUNTER — Other Ambulatory Visit: Payer: Self-pay | Admitting: Anatomic Pathology & Clinical Pathology

## 2021-12-15 LAB — SURGICAL PATHOLOGY

## 2021-12-16 ENCOUNTER — Encounter: Payer: Self-pay | Admitting: Internal Medicine

## 2021-12-16 ENCOUNTER — Telehealth: Payer: Self-pay

## 2021-12-16 ENCOUNTER — Inpatient Hospital Stay: Payer: 59

## 2021-12-16 ENCOUNTER — Inpatient Hospital Stay: Payer: 59 | Attending: Radiation Oncology | Admitting: Internal Medicine

## 2021-12-16 VITALS — BP 97/70 | HR 91 | Temp 96.7°F | Resp 16 | Wt 177.2 lb

## 2021-12-16 DIAGNOSIS — E876 Hypokalemia: Secondary | ICD-10-CM | POA: Insufficient documentation

## 2021-12-16 DIAGNOSIS — M25552 Pain in left hip: Secondary | ICD-10-CM | POA: Diagnosis not present

## 2021-12-16 DIAGNOSIS — E079 Disorder of thyroid, unspecified: Secondary | ICD-10-CM | POA: Insufficient documentation

## 2021-12-16 DIAGNOSIS — J449 Chronic obstructive pulmonary disease, unspecified: Secondary | ICD-10-CM | POA: Insufficient documentation

## 2021-12-16 DIAGNOSIS — C3432 Malignant neoplasm of lower lobe, left bronchus or lung: Secondary | ICD-10-CM

## 2021-12-16 DIAGNOSIS — Z79899 Other long term (current) drug therapy: Secondary | ICD-10-CM | POA: Diagnosis not present

## 2021-12-16 DIAGNOSIS — D649 Anemia, unspecified: Secondary | ICD-10-CM | POA: Diagnosis not present

## 2021-12-16 DIAGNOSIS — M25551 Pain in right hip: Secondary | ICD-10-CM

## 2021-12-16 DIAGNOSIS — Z5111 Encounter for antineoplastic chemotherapy: Secondary | ICD-10-CM | POA: Diagnosis present

## 2021-12-16 LAB — COMPREHENSIVE METABOLIC PANEL
ALT: 18 U/L (ref 0–44)
AST: 33 U/L (ref 15–41)
Albumin: 3.3 g/dL — ABNORMAL LOW (ref 3.5–5.0)
Alkaline Phosphatase: 75 U/L (ref 38–126)
Anion gap: 8 (ref 5–15)
BUN: 12 mg/dL (ref 8–23)
CO2: 25 mmol/L (ref 22–32)
Calcium: 8.9 mg/dL (ref 8.9–10.3)
Chloride: 105 mmol/L (ref 98–111)
Creatinine, Ser: 0.88 mg/dL (ref 0.44–1.00)
GFR, Estimated: >60 mL/min (ref >60)
Glucose, Bld: 192 mg/dL — ABNORMAL HIGH (ref 70–99)
Potassium: 3.6 mmol/L (ref 3.5–5.1)
Sodium: 138 mmol/L (ref 135–145)
Total Bilirubin: 0.5 mg/dL (ref 0.3–1.2)
Total Protein: 6.7 g/dL (ref 6.5–8.1)

## 2021-12-16 LAB — CBC WITH DIFFERENTIAL/PLATELET
Abs Immature Granulocytes: 0.07 10*3/uL (ref 0.00–0.07)
Basophils Absolute: 0.1 10*3/uL (ref 0.0–0.1)
Basophils Relative: 1 %
Eosinophils Absolute: 0.1 10*3/uL (ref 0.0–0.5)
Eosinophils Relative: 1 %
HCT: 37.3 % (ref 36.0–46.0)
Hemoglobin: 11.4 g/dL — ABNORMAL LOW (ref 12.0–15.0)
Immature Granulocytes: 1 %
Lymphocytes Relative: 24 %
Lymphs Abs: 2.4 10*3/uL (ref 0.7–4.0)
MCH: 31.8 pg (ref 26.0–34.0)
MCHC: 30.6 g/dL (ref 30.0–36.0)
MCV: 103.9 fL — ABNORMAL HIGH (ref 80.0–100.0)
Monocytes Absolute: 0.7 10*3/uL (ref 0.1–1.0)
Monocytes Relative: 7 %
Neutro Abs: 6.9 10*3/uL (ref 1.7–7.7)
Neutrophils Relative %: 66 %
Platelets: 239 10*3/uL (ref 150–400)
RBC: 3.59 MIL/uL — ABNORMAL LOW (ref 3.87–5.11)
RDW: 18.2 % — ABNORMAL HIGH (ref 11.5–15.5)
WBC: 10.3 10*3/uL (ref 4.0–10.5)
nRBC: 0 % (ref 0.0–0.2)

## 2021-12-16 LAB — TSH: TSH: 29.998 u[IU]/mL — ABNORMAL HIGH (ref 0.350–4.500)

## 2021-12-16 MED ORDER — GABAPENTIN 300 MG PO CAPS: ORAL_CAPSULE | ORAL | 1 refills | Status: DC

## 2021-12-16 MED ORDER — HEPARIN SOD (PORK) LOCK FLUSH 100 UNIT/ML IV SOLN
500.0000 [IU] | Freq: Once | INTRAVENOUS | Status: AC
  Administered 2021-12-16: 500 [IU] via INTRAVENOUS
  Filled 2021-12-16: qty 5

## 2021-12-16 NOTE — Progress Notes (Signed)
Worsening more consistent left hip pain 3/10 pain scale with taking Hydrocodone this morning and requiring more routine pain meds.    New bilateral rib pain L>R with radiating pain to back.    BP 97/70, HR 91-took Hydrocodone at 6:30 am.  Scheduled for 2nd cataract surgery 01/19/2022.

## 2021-12-16 NOTE — Assessment & Plan Note (Addendum)
#  Stage IV -non-small cell favor adenocarcinoma;  MAY 30th, 2023- Interval increase in size of the 2.5 cm spiculated left lower lobe pulmonary nodule with a new solid 5 mm nodule adjacent to the spiculated mass as well as increased size of the subcarinal and hilar lymph nodes, findings which are concerning for disease progression.JUNE 8th, 2023-  Enlarging left lower lobe pulmonary nodule seen on recent CT scan is markedly hypermetabolic, consistent with neoplasm; Hypermetabolic nodal metastatic disease in the left hilum and central mediastinum.  Patient currently on Alimta-keytruda maintenance.  # HOLD  Alimta-Keytruda maintenance. Labs today reviewed; However-see below regarding liver biopsy.  # liver Biopsy-positive for "small cell"/high-grade neuroendocrine-I reviewed the pathology; and the nature of the course of the disease in detail.  Patient understands treatment options different/prognosis is unfortunately overall poor. Discussed chemo immunotherapy for extensive stage small cell lung cancer includes carbo etoposide-Tecentriq every 3 weeks x 4 cycles.  Response would be evaluated with interim imaging.  We will also recommend maintenance Tecentriq post chemotherapy. Discussed the potential side effects including but not limited to-increasing fatigue, nausea vomiting, diarrhea, hair loss, sores in the mouth, increase risk of infection and also neuropathy.    I discussed the mechanism of action; The goal of therapy is palliative; and length of treatments are likely ongoing/based upon the results of the scans. Discussed the potential side effects of immunotherapy including but not limited to diarrhea; skin rash; elevated LFTs/endocrine abnormalities etc.  # Given worsening pain- Right sciatica/ radiates down the leg;  [oct 5th]; bone scan- OCT 2023- No scintigraphic evidence of bone metastasis.  I added gabapentin once nightly and then if no side effects then twice a day.  Referral to orthopedics for hip  pain suspect benign process/arthritis.  # COPD [Dr.A]/allergies likely secondary to poorly controlled COPD. Continue using albuterol 3-4 times a day and also compliance with Advair.  continue Singulair.  STABLE;  # Right eye vision changes-3.6 mm lesion noted in the right eye;s/p ophthalmology evaluation at [facility]. 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 JUlY 2023- s/p revaluation with [facility] opth end of month- STABLE.   # Diabetes-A1c 6.9 [AUG 2022]-PBF- 164- Continue glipizide 5 mg BID STABLE;    # Cough secondary to radiation-currently on Tessalon Perles.STABLE;   # Hypocalcemia: on Vit D BID [8.6; ca-kidney stones].  JUNE  VitD levels-48.  STABLE;    # Mediport placement-s/p dye study c tPA-functioning. STABLE; ;   * B12 - on 11/18/2021 - 4w- catatarct s.   #DISPOSITION: # HOLD treatment today; De-access # [facility] for [facility] pain ASAP  # follow up TBD- Dr.B  Addendum: Tentative plan to start- on Nov 14th-2023-carbo plus etoposide; etop- days 2 &3; D-4 Ellen Henri.  We will check with scheduling/insurance.  Message sent.

## 2021-12-16 NOTE — Progress Notes (Signed)
I connected with Haly Whipkey on 12/16/21 at  9:15 AM EDT by video enabled telemedicine visit and verified that I am speaking with the correct person using two identifiers.  I discussed the limitations, risks, security and privacy concerns of performing an evaluation and management service by telemedicine and the availability of in-person appointments. I also discussed with the patient that there may be a patient responsible charge related to this service. The patient expressed understanding and agreed to proceed.    Other persons participating in the visit and their role in the encounter: RN/medical reconciliation Patient's location: home Provider's location: office  Oncology History Overview Note  IMPRESSION: Hypermetabolic solid pulmonary nodule of the left lower lobe, favor primary lung malignancy.   Numerous hypermetabolic nodules of the lower left pleura, compatible with pleural metastatic disease.   Hypermetabolic subcarinal and left hilar lymph nodes, compatible with metastatic disease.   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 Recommend attention on follow-up.   Mild asymmetric FDG uptake below mediastinal blood pool within a small nodule of the left parotid gland, favored to be benign. Recommend attention on follow-up.   No evidence of metastatic disease in the abdomen or pelvis.   No evidence of osseous metastatic disease.   ---------------------  # MARCH 2022- incidental LLL [ 0.9 cm] s/p COVID vaccine; [Dr.Aleskerov]; July 2022- left pleural pain-   #  #October 2022 MRI brain-punctate lesion-asymptomatic [D/w- Dr.Vaslow]; December 2022 MRI negative for any acute process; chronic sclerosis of hippocampus-clinically insignificant.  COPD-non-complaint     ------------------   DIAGNOSIS:  A. PLEURAL MASS, LEFT CHEST; BIOPSY:  - DIAGNOSTIC OF MALIGNANCY.  - NON-SMALL CELL CARCINOMA, FAVOR ADENOCARCINOMA.   Comment:  The tumor cells are positive for CK7 and TTF1 (patchy).  They are  negative for p40.   # 12/10/2020-carbo Alimta; cycle #1.    # JUNE 8th, 2023-  Enlarging left lower lobe pulmonary nodule seen on recent CT scan is markedly hypermetabolic, consistent with neoplasm; Hypermetabolic nodal metastatic disease in the left hilum and central mediastinum. JUNE- 2023- RT 20 Fractions.  # NGS PD-L1 TPS 1% ------------------------ OCT 2023- liver Biopsy-positive for "small cell"/high-grade neuroendocrine-  # ? NOV 14th, 2023-  carbo etoposide-Tecentriq every 3 weeks x 4 cycles.     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[place])   Cancer of lower lobe of left lung ([place])  12/02/2020 Initial Diagnosis   Cancer of lower lobe of left lung ([place])   12/10/2020 Cancer Staging   Staging form: Lung, AJCC 8th Edition - Clinical: Stage IVA (cT4, cN3, cM1a) - Signed by Cammie Sickle, MD on 12/10/2020   12/10/2020 - 09/16/2021 Chemotherapy   Patient is on Treatment Plan : LUNG CARBOplatin / Pemetrexed / Pembrolizumab q21d Induction x 4 cycles / Maintenance Pemetrexed + Pembrolizumab     12/10/2020 - 11/18/2021 Chemotherapy   Patient is on Treatment Plan : LUNG Carboplatin (5) + Pemetrexed (500) + Pembrolizumab (200) D1 q21d Induction x 4 cycles / Maintenance Pemetrexed (500) + Pembrolizumab (200) D1 q21d     12/22/2021 -  Chemotherapy   Patient is on Treatment Plan : LUNG SCLC Carboplatin + Etoposide + Atezolizumab Induction q21d x 4 cycles / Atezolizumab Maintenance q21d      Chief Complaint: lung cancer  History of present illness:Felicia Manning 66 y.o.  female with history of stage IV adenocarcinoma of the lung is here for follow-up/liver biopsy.   In the  interim patient underwent cataract surgery.   Interim patient underwent biopsy of the liver lesion.  Worsening more consistent left hip pain 3/10 pain scale with taking Hydrocodone this morning and requiring more routine pain meds.  New bilateral rib pain L>R with radiating pain to back.     Not sleeping well at night.  Observation/objective: Alert & oriented x 3. In No acute distress.   Assessment and plan: Cancer of lower lobe of left lung ([place]) #Stage IV -non-small cell favor adenocarcinoma;  MAY 30th, 2023- Interval increase in size of the 2.5 cm spiculated left lower lobe pulmonary nodule with a new solid 5 mm nodule adjacent to the spiculated mass as well as increased size of the subcarinal and hilar lymph nodes, findings which are concerning for disease progression.JUNE 8th, 2023-  Enlarging left lower lobe pulmonary nodule seen on recent CT scan is markedly hypermetabolic, consistent with neoplasm; Hypermetabolic nodal metastatic disease in the left hilum and central mediastinum.  Patient currently on Alimta-keytruda maintenance.  # HOLD  Alimta-Keytruda maintenance. Labs today reviewed; However-see below regarding liver biopsy.  # liver Biopsy-positive for "small cell"/high-grade neuroendocrine-I reviewed the pathology; and the nature of the course of the disease in detail.  Patient understands treatment options different/prognosis is unfortunately overall poor. Discussed chemo immunotherapy for extensive stage small cell lung cancer includes carbo etoposide-Tecentriq every 3 weeks x 4 cycles.  Response would be evaluated with interim imaging.  We will also recommend maintenance Tecentriq post chemotherapy. Discussed the potential side effects including but not limited to-increasing fatigue, nausea vomiting, diarrhea, hair loss, sores in the mouth, increase risk of infection and also neuropathy.    I discussed the mechanism of action; The goal of therapy is palliative; and length of  treatments are likely ongoing/based upon the results of the scans. Discussed the potential side effects of immunotherapy including but not limited to diarrhea; skin rash; elevated LFTs/endocrine abnormalities etc.  # Given worsening pain- Right sciatica/ radiates down the leg;  [oct 5th]; bone scan- OCT 2023- No scintigraphic evidence of bone metastasis.  I added gabapentin once nightly and then if no side effects then twice a day.  Referral to orthopedics for hip pain suspect benign process/arthritis.  # COPD [Dr.A]/allergies likely secondary to poorly controlled COPD. Continue using albuterol 3-4 times a day and also compliance with Advair.  continue Singulair.  STABLE;  # Right eye vision changes-3.6 mm lesion noted in the right eye;s/p ophthalmology evaluation at [facility]. 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 JUlY 2023- s/p revaluation with [facility] opth end of month- STABLE.   # Diabetes-A1c 6.9 [AUG 2022]-PBF- 164- Continue glipizide 5 mg BID STABLE;    # Cough secondary to radiation-currently on Tessalon Perles.STABLE;   # Hypocalcemia: on Vit D BID [8.6; ca-kidney stones].  JUNE  VitD levels-48.  STABLE;    # Mediport placement-s/p dye study c tPA-functioning. STABLE; ;   * B12 - on 11/18/2021 - 4w- catatarct s.   #DISPOSITION: # HOLD treatment today; De-access # [facility] for [facility] AS[facility]  # follow up TBD- Dr.B  Addendum: Tentative plan to start- on Nov 14th-2023-carbo plus etoposide; etop- days 2 &3; D-4 Ellen Henri.  We will check with scheduling/insurance.  Message sent.   Follow-up instructions:  I  discussed the assessment and treatment plan with the patient.  The patient was provided an opportunity to ask questions and all were answered.  The patient agreed with the plan and demonstrated understanding of instructions.  The patient was advised to call back or seek  an in person evaluation if the symptoms worsen or if the condition fails to improve as anticipated.    Dr. Charlaine Dalton Avery at [facility] 12/16/2021 9:11 PM

## 2021-12-16 NOTE — Telephone Encounter (Signed)
ASAP referral for hip pain faxed to [facility] (P: [phone]/N: [phone])

## 2021-12-16 NOTE — Progress Notes (Signed)
START ON PATHWAY REGIMEN - Small Cell Lung     Cycles 1 through 4, every 21 days:     Atezolizumab      Carboplatin      Etoposide    Cycles 5 and beyond, every 21 days:     Atezolizumab   **Always confirm dose/schedule in your pharmacy ordering system**  Patient Characteristics: Newly Diagnosed, Preoperative or Nonsurgical Candidate (Clinical Staging), First Line, Extensive Stage Therapeutic Status: Newly Diagnosed, Preoperative or Nonsurgical Candidate (Clinical Staging) AJCC T Category: cTX AJCC N Category: cNX AJCC M Category: cM1 AJCC 8 Stage Grouping: IV Stage Classification: Extensive  Intent of Therapy: Non-Curative / Palliative Intent, Discussed with Patient

## 2021-12-17 LAB — T4: T4, Total: 10.2 ug/dL (ref 4.5–12.0)

## 2021-12-18 ENCOUNTER — Telehealth: Payer: Self-pay | Admitting: *Deleted

## 2021-12-18 DIAGNOSIS — M25551 Pain in right hip: Secondary | ICD-10-CM

## 2021-12-18 DIAGNOSIS — C3432 Malignant neoplasm of lower lobe, left bronchus or lung: Secondary | ICD-10-CM

## 2021-12-18 NOTE — Telephone Encounter (Signed)
Patient called to request to be referred to a different ortho.  Referral faxed to [facility] [place] with MD.

## 2021-12-18 NOTE — Telephone Encounter (Signed)
Patient has encountered a problem with the referral to [facility] in them telling her that she owe smoney from 2009 for services done in [place] where she has never been to and they are checking into this fo rher and will not schedule her until they clear that up. She is therefore asking a referral to another local ortho in her network (She see Dr Richarda Overlie at [facility]) so maybe refer to [facility]?

## 2021-12-18 NOTE — Telephone Encounter (Signed)
Referral faxed to [facility] ortho

## 2021-12-22 ENCOUNTER — Inpatient Hospital Stay: Payer: 59

## 2021-12-22 ENCOUNTER — Inpatient Hospital Stay (HOSPITAL_BASED_OUTPATIENT_CLINIC_OR_DEPARTMENT_OTHER): Payer: 59 | Admitting: Oncology

## 2021-12-22 ENCOUNTER — Encounter: Payer: Self-pay | Admitting: Oncology

## 2021-12-22 ENCOUNTER — Encounter: Payer: Self-pay | Admitting: Internal Medicine

## 2021-12-22 VITALS — BP 110/77 | HR 86 | Temp 98.5°F | Resp 16 | Ht 67.0 in | Wt 174.5 lb

## 2021-12-22 DIAGNOSIS — C3432 Malignant neoplasm of lower lobe, left bronchus or lung: Secondary | ICD-10-CM | POA: Diagnosis not present

## 2021-12-22 DIAGNOSIS — Z5111 Encounter for antineoplastic chemotherapy: Secondary | ICD-10-CM | POA: Diagnosis not present

## 2021-12-22 LAB — CBC WITH DIFFERENTIAL/PLATELET
Abs Immature Granulocytes: 0.06 10*3/uL (ref 0.00–0.07)
Basophils Absolute: 0.1 10*3/uL (ref 0.0–0.1)
Basophils Relative: 1 %
Eosinophils Absolute: 0.1 10*3/uL (ref 0.0–0.5)
Eosinophils Relative: 2 %
HCT: 38.3 % (ref 36.0–46.0)
Hemoglobin: 11.9 g/dL — ABNORMAL LOW (ref 12.0–15.0)
Immature Granulocytes: 1 %
Lymphocytes Relative: 18 %
Lymphs Abs: 1.6 10*3/uL (ref 0.7–4.0)
MCH: 32.1 pg (ref 26.0–34.0)
MCHC: 31.1 g/dL (ref 30.0–36.0)
MCV: 103.2 fL — ABNORMAL HIGH (ref 80.0–100.0)
Monocytes Absolute: 0.8 10*3/uL (ref 0.1–1.0)
Monocytes Relative: 9 %
Neutro Abs: 6.2 10*3/uL (ref 1.7–7.7)
Neutrophils Relative %: 69 %
Platelets: 254 10*3/uL (ref 150–400)
RBC: 3.71 MIL/uL — ABNORMAL LOW (ref 3.87–5.11)
RDW: 17.7 % — ABNORMAL HIGH (ref 11.5–15.5)
WBC: 8.9 10*3/uL (ref 4.0–10.5)
nRBC: 0 % (ref 0.0–0.2)

## 2021-12-22 LAB — COMPREHENSIVE METABOLIC PANEL
ALT: 15 U/L (ref 0–44)
AST: 27 U/L (ref 15–41)
Albumin: 3.4 g/dL — ABNORMAL LOW (ref 3.5–5.0)
Alkaline Phosphatase: 76 U/L (ref 38–126)
Anion gap: 11 (ref 5–15)
BUN: 10 mg/dL (ref 8–23)
CO2: 25 mmol/L (ref 22–32)
Calcium: 9 mg/dL (ref 8.9–10.3)
Chloride: 103 mmol/L (ref 98–111)
Creatinine, Ser: 0.86 mg/dL (ref 0.44–1.00)
GFR, Estimated: >60 mL/min (ref >60)
Glucose, Bld: 148 mg/dL — ABNORMAL HIGH (ref 70–99)
Potassium: 3.4 mmol/L — ABNORMAL LOW (ref 3.5–5.1)
Sodium: 139 mmol/L (ref 135–145)
Total Bilirubin: 0.5 mg/dL (ref 0.3–1.2)
Total Protein: 6.9 g/dL (ref 6.5–8.1)

## 2021-12-22 LAB — TSH: TSH: 49.418 u[IU]/mL — ABNORMAL HIGH (ref 0.350–4.500)

## 2021-12-22 MED ORDER — HEPARIN SOD (PORK) LOCK FLUSH 100 UNIT/ML IV SOLN
INTRAVENOUS | Status: AC
  Filled 2021-12-22: qty 5

## 2021-12-22 MED ORDER — PALONOSETRON HCL INJECTION 0.25 MG/5ML
0.2500 mg | Freq: Once | INTRAVENOUS | Status: AC
  Administered 2021-12-22: 0.25 mg via INTRAVENOUS

## 2021-12-22 MED ORDER — SODIUM CHLORIDE 0.9 % IV SOLN
1200.0000 mg | Freq: Once | INTRAVENOUS | Status: AC
  Administered 2021-12-22: 1200 mg via INTRAVENOUS
  Filled 2021-12-22: qty 20

## 2021-12-22 MED ORDER — SODIUM CHLORIDE 0.9 % IV SOLN
150.0000 mg | Freq: Once | INTRAVENOUS | Status: AC
  Administered 2021-12-22: 150 mg via INTRAVENOUS
  Filled 2021-12-22: qty 150

## 2021-12-22 MED ORDER — HEPARIN SOD (PORK) LOCK FLUSH 100 UNIT/ML IV SOLN
500.0000 [IU] | Freq: Once | INTRAVENOUS | Status: AC | PRN
  Administered 2021-12-22: 500 [IU]
  Filled 2021-12-22: qty 5

## 2021-12-22 MED ORDER — SODIUM CHLORIDE 0.9 % IV SOLN
10.0000 mg | Freq: Once | INTRAVENOUS | Status: AC
  Administered 2021-12-22: 10 mg via INTRAVENOUS
  Filled 2021-12-22: qty 10

## 2021-12-22 MED ORDER — SODIUM CHLORIDE 0.9 % IV SOLN
100.0000 mg/m2 | Freq: Once | INTRAVENOUS | Status: AC
  Administered 2021-12-22: 200 mg via INTRAVENOUS
  Filled 2021-12-22: qty 10

## 2021-12-22 MED ORDER — SODIUM CHLORIDE 0.9 % IV SOLN
Freq: Once | INTRAVENOUS | Status: AC
  Filled 2021-12-22: qty 250

## 2021-12-22 MED ORDER — SODIUM CHLORIDE 0.9 % IV SOLN
544.5000 mg | Freq: Once | INTRAVENOUS | Status: AC
  Administered 2021-12-22: 540 mg via INTRAVENOUS
  Filled 2021-12-22: qty 54

## 2021-12-22 MED ORDER — SODIUM CHLORIDE 0.9% FLUSH
10.0000 mL | INTRAVENOUS | Status: DC | PRN
  Administered 2021-12-22: 10 mL
  Filled 2021-12-22: qty 10

## 2021-12-22 MED FILL — Dexamethasone Sodium Phosphate Inj 100 MG/10ML: INTRAMUSCULAR | Qty: 1 | Status: AC

## 2021-12-22 NOTE — Patient Instructions (Addendum)
[facility] CANCER CTR AT [place]  Discharge Instructions: Thank you for choosing [facility] to provide your oncology and hematology care.  If you have a lab appointment with the [facility], please go directly to the [facility] and check in at the registration area.  Wear comfortable clothing and clothing appropriate for easy access to any Portacath or PICC line.   We strive to give you quality time with your provider. You may need to reschedule your appointment if you arrive late (15 or more minutes).  Arriving late affects you and other patients whose appointments are after yours.  Also, if you miss three or more appointments without notifying the office, you may be dismissed from the clinic at the provider's discretion.      For prescription refill requests, have your pharmacy contact our office and allow 72 hours for refills to be completed.    Today you received the following chemotherapy and/or immunotherapy agents- Tecentriq, Carboplatin, Etoposide      To help prevent nausea and vomiting after your treatment, we encourage you to take your nausea medication as directed.  BELOW ARE SYMPTOMS THAT SHOULD BE REPORTED IMMEDIATELY: *FEVER GREATER THAN 100.4 F (38 C) OR HIGHER *CHILLS OR SWEATING *NAUSEA AND VOMITING THAT IS NOT CONTROLLED WITH YOUR NAUSEA MEDICATION *UNUSUAL SHORTNESS OF BREATH *UNUSUAL BRUISING OR BLEEDING *URINARY PROBLEMS (pain or burning when urinating, or frequent urination) *BOWEL PROBLEMS (unusual diarrhea, constipation, pain near the anus) TENDERNESS IN MOUTH AND THROAT WITH OR WITHOUT PRESENCE OF ULCERS (sore throat, sores in mouth, or a toothache) UNUSUAL RASH, SWELLING OR PAIN  UNUSUAL VAGINAL DISCHARGE OR ITCHING   Items with * indicate a potential emergency and should be followed up as soon as possible or go to the Emergency Department if any problems should occur.  Please show the CHEMOTHERAPY ALERT CARD or IMMUNOTHERAPY  ALERT CARD at check-in to the Emergency Department and triage nurse.  Should you have questions after your visit or need to cancel or reschedule your appointment, please contact [facility] CANCER [place] AT [place]  [phone] and follow the prompts.  Office hours are 8:00 a.m. to 4:30 p.m. Monday - Friday. Please note that voicemails left after 4:00 p.m. may not be returned until the following business day.  We are closed weekends and major holidays. You have access to a nurse at all times for urgent questions. Please call the main number to the clinic [phone] and follow the prompts.  For any non-urgent questions, you may also contact your provider using MyChart. We now offer e-Visits for anyone 25 and older to request care online for non-urgent symptoms. For details visit [email or web address].   Also download the MyChart app! Go to the app store, search "MyChart", open the app, select [place], and log in with your MyChart username and password.  Masks are optional in the cancer centers. If you would like for your care team to wear a mask while they are taking care of you, please let them know. For doctor visits, patients may have with them one support person who is at least 64 years old. At this time, visitors are not allowed in the infusion area.  Atezolizumab Injection What is this medication? ATEZOLIZUMAB (a te zoe LIZ ue mab) treats some types of cancer. It works by helping your immune system slow or stop the spread of cancer cells. It is a monoclonal antibody. This medicine may be used for other purposes; ask your health care provider or pharmacist if  you have questions. COMMON BRAND NAME(S): Tecentriq What should I tell my care team before I take this medication? They need to know if you have any of these conditions: Allogeneic stem cell transplant (uses someone else's stem cells) Autoimmune diseases, such as Crohn disease, ulcerative colitis, lupus History of chest  radiation Nervous system problems, such as Guillain-Barre syndrome, myasthenia gravis Organ transplant An unusual or allergic reaction to atezolizumab, other medications, foods, dyes, or preservatives Pregnant or trying to get pregnant Breast-feeding How should I use this medication? This medication is injected into a vein. It is given by your care team in a hospital or clinic setting. A special MedGuide will be given to you before each treatment. Be sure to read this information carefully each time. Talk to your care team about the use of this medication in children. While it may be prescribed for children as young as 2 years for selected conditions, precautions do apply. Overdosage: If you think you have taken too much of this medicine contact a poison control center or emergency room at once. NOTE: This medicine is only for you. Do not share this medicine with others. What if I miss a dose? Keep appointments for follow-up doses. It is important not to miss your dose. Call your care team if you are unable to keep an appointment. What may interact with this medication? Interactions have not been studied. This list may not describe all possible interactions. Give your health care provider a list of all the medicines, herbs, non-prescription drugs, or dietary supplements you use. Also tell them if you smoke, drink alcohol, or use illegal drugs. Some items may interact with your medicine. What should I watch for while using this medication? Your condition will be monitored carefully while you are receiving this medication. You may need blood work while taking this medication. This medication may cause serious skin reactions. They can happen weeks to months after starting the medication. Contact your care team right away if you notice fevers or flu-like symptoms with a rash. The rash may be red or purple and then turn into blisters or peeling of the skin. You may also notice a red rash with swelling  of the face, lips, or lymph nodes in your neck or under your arms. Tell your care team right away if you have any change in your eyesight. Talk to your care team if you may be pregnant. Serious birth defects can occur if you take this medication during pregnancy and for 5 months after the last dose. You will need a negative pregnancy test before starting this medication. Contraception is recommended while taking this medication and for 5 months after the last dose. Your care team can help you find the option that works for you. Do not breastfeed while taking this medication and for at least 5 months after the last dose. What side effects may I notice from receiving this medication? Side effects that you should report to your doctor or health care professional as soon as possible: Allergic reactions--skin rash, itching, hives, swelling of the face, lips, tongue, or throat Dry cough, shortness of breath or trouble breathing Eye pain, redness, irritation, or discharge with blurry or decreased vision Heart muscle inflammation--unusual weakness or fatigue, shortness of breath, chest pain, fast or irregular heartbeat, dizziness, swelling of the ankles, feet, or hands Hormone gland problems--headache, sensitivity to light, unusual weakness or fatigue, dizziness, fast or irregular heartbeat, increased sensitivity to cold or heat, excessive sweating, constipation, hair loss, increased thirst or  amount of urine, tremors or shaking, irritability Infusion reactions--chest pain, shortness of breath or trouble breathing, feeling faint or lightheaded Kidney injury (glomerulonephritis)--decrease in the amount of urine, red or dark brown urine, foamy or bubbly urine, swelling of the ankles, hands, or feet Liver injury--right upper belly pain, loss of appetite, nausea, light-colored stool, dark yellow or brown urine, yellowing skin or eyes, unusual weakness or fatigue Pain, tingling, or numbness in the hands or feet,  muscle weakness, change in vision, confusion or trouble speaking, loss of balance or coordination, trouble walking, seizures Rash, fever, and swollen lymph nodes Redness, blistering, peeling, or loosening of the skin, including inside the mouth Sudden or severe stomach pain, bloody diarrhea, fever, nausea, vomiting Side effects that usually do not require medical attention (report to your doctor or health care professional if they continue or are bothersome): Bone, joint, or muscle pain Diarrhea Fatigue Loss of appetite Nausea Skin rash This list may not describe all possible side effects. Call your doctor for medical advice about side effects. You may report side effects to FDA at 1-800-FDA-1088. Where should I keep my medication? This medication is given in a hospital or clinic. It will not be stored at home. NOTE: This sheet is a summary. It may not cover all possible information. If you have questions about this medicine, talk to your doctor, pharmacist, or health care provider.  2023 Elsevier/Gold Standard (2021-06-16 00:00:00) Carboplatin Injection What is this medication? CARBOPLATIN (KAR boe pla tin) treats some types of cancer. It works by slowing down the growth of cancer cells. This medicine may be used for other purposes; ask your health care provider or pharmacist if you have questions. COMMON BRAND NAME(S): Paraplatin What should I tell my care team before I take this medication? They need to know if you have any of these conditions: Blood disorders Hearing problems Kidney disease Recent or ongoing radiation therapy An unusual or allergic reaction to carboplatin, cisplatin, other medications, foods, dyes, or preservatives Pregnant or trying to get pregnant Breast-feeding How should I use this medication? This medication is injected into a vein. It is given by your care team in a hospital or clinic setting. Talk to your care team about the use of this medication in  children. Special care may be needed. Overdosage: If you think you have taken too much of this medicine contact a poison control center or emergency room at once. NOTE: This medicine is only for you. Do not share this medicine with others. What if I miss a dose? Keep appointments for follow-up doses. It is important not to miss your dose. Call your care team if you are unable to keep an appointment. What may interact with this medication? Medications for seizures Some antibiotics, such as amikacin, gentamicin, neomycin, streptomycin, tobramycin Vaccines This list may not describe all possible interactions. Give your health care provider a list of all the medicines, herbs, non-prescription drugs, or dietary supplements you use. Also tell them if you smoke, drink alcohol, or use illegal drugs. Some items may interact with your medicine. What should I watch for while using this medication? Your condition will be monitored carefully while you are receiving this medication. You may need blood work while taking this medication. This medication may make you feel generally unwell. This is not uncommon, as chemotherapy can affect healthy cells as well as cancer cells. Report any side effects. Continue your course of treatment even though you feel ill unless your care team tells you to stop. In  some cases, you may be given additional medications to help with side effects. Follow all directions for their use. This medication may increase your risk of getting an infection. Call your care team for advice if you get a fever, chills, sore throat, or other symptoms of a cold or flu. Do not treat yourself. Try to avoid being around people who are sick. Avoid taking medications that contain aspirin, acetaminophen, ibuprofen, naproxen, or ketoprofen unless instructed by your care team. These medications may hide a fever. Be careful brushing or flossing your teeth or using a toothpick because you may get an infection or  bleed more easily. If you have any dental work done, tell your dentist you are receiving this medication. Talk to your care team if you wish to become pregnant or think you might be pregnant. This medication can cause serious birth defects. Talk to your care team about effective forms of contraception. Do not breast-feed while taking this medication. What side effects may I notice from receiving this medication? Side effects that you should report to your care team as soon as possible: Allergic reactions--skin rash, itching, hives, swelling of the face, lips, tongue, or throat Infection--fever, chills, cough, sore throat, wounds that don't heal, pain or trouble when passing urine, general feeling of discomfort or being unwell Low red blood cell level--unusual weakness or fatigue, dizziness, headache, trouble breathing Pain, tingling, or numbness in the hands or feet, muscle weakness, change in vision, confusion or trouble speaking, loss of balance or coordination, trouble walking, seizures Unusual bruising or bleeding Side effects that usually do not require medical attention (report to your care team if they continue or are bothersome): Hair loss Nausea Unusual weakness or fatigue Vomiting This list may not describe all possible side effects. Call your doctor for medical advice about side effects. You may report side effects to FDA at 1-800-FDA-1088. Where should I keep my medication? This medication is given in a hospital or clinic. It will not be stored at home. NOTE: This sheet is a summary. It may not cover all possible information. If you have questions about this medicine, talk to your doctor, pharmacist, or health care provider.  2023 Elsevier/Gold Standard (2021-05-18 00:00:00) Etoposide Capsules What is this medication? ETOPOSIDE (e toe POE side) treats lung cancer. It works by slowing down the growth of cancer cells. This medicine may be used for other purposes; ask your health  care provider or pharmacist if you have questions. COMMON BRAND NAME(S): VePesid What should I tell my care team before I take this medication? They need to know if you have any of these conditions: Infection Kidney disease Liver disease Low blood counts, such as low white cell, platelet, red cell counts An unusual or allergic reaction to etoposide, other medications, foods, dyes, or preservatives Pregnant or trying to get pregnant Breastfeeding How should I use this medication? Take this medication by mouth with a glass of water. Take it as directed on the prescription label. Do not cut, crush, or chew this medication. Swallow the capsules whole. Do not take it more often than directed. Keep taking it unless your care team tells you to stop. Handling this medication may be harmful. Wear gloves while touching this medication or bottle. Talk to your care team about how to handle this medication. Special instructions may apply. Talk to your care team about the use of this medication in children. Special care may be needed. Overdosage: If you think you have taken too much of this medicine  contact a poison control center or emergency room at once. NOTE: This medicine is only for you. Do not share this medicine with others. What if I miss a dose? If you miss a dose, take it as soon as you can. If it is almost time for your next dose, take only that dose. Do not take double or extra doses. What may interact with this medication? Cyclosporine Warfarin This list may not describe all possible interactions. Give your health care provider a list of all the medicines, herbs, non-prescription drugs, or dietary supplements you use. Also tell them if you smoke, drink alcohol, or use illegal drugs. Some items may interact with your medicine. What should I watch for while using this medication? Your condition will be monitored carefully while you are receiving this medication. This medication may make you  feel generally unwell. This is not uncommon as chemotherapy can affect healthy cells as well as cancer cells. Report any side effects. Continue your course of treatment even though you feel ill unless your care team tells you to stop. This medication may increase your risk of getting an infection. Call your care team for advice if you get a fever, chills, sore throat, or other symptoms of a cold or flu. Do not treat yourself. Try to avoid being around people who are sick. This medication may increase your risk to bruise or bleed. Call your care team if you notice any unusual bleeding. Talk to your care team about your risk of cancer. You may be more at risk for certain types of cancers if you take this medication. Talk to your care team if you may be pregnant. Serious birth defects can occur if you take this medication during pregnancy and for 6 months after the last dose. You will need a negative pregnancy test before starting this medication. Contraception is recommended while taking this medication and for 6 months after the last dose. Your care team can help you find the option that works for you. If your partner can get pregnant, use a condom during sex while taking this medication and for 4 months after the last dose. Do not breastfeed while taking this medication. This medication may cause infertility. Talk to your care team if you are concerned about your fertility. What side effects may I notice from receiving this medication? Side effects that you should report to your care team as soon as possible: Allergic reactions--skin rash, itching, hives, swelling of the face, lips, tongue, or throat Infection--fever, chills, cough, sore throat, wounds that don't heal, pain or trouble when passing urine, general feeling of discomfort or being unwell Low red blood cell level--unusual weakness or fatigue, dizziness, headache, trouble breathing Unusual bruising or bleeding Side effects that usually do not  require medical attention (report to your care team if they continue or are bothersome): Diarrhea Fatigue Hair loss Loss of appetite Nausea Pain, redness, or swelling with sores inside the mouth or throat Vomiting This list may not describe all possible side effects. Call your doctor for medical advice about side effects. You may report side effects to FDA at 1-800-FDA-1088. Where should I keep my medication? Keep out of the reach of children and pets. Store in a refrigerator between 2 and 8 degrees C (36 and 46 degrees F). Do not freeze. Get rid any unused medication after the expiration date. To get rid of medications that are no longer needed or have expired: Take the medication to a medication take-back program. Check with your pharmacy  or law enforcement to find a location. If you cannot return the medication, ask your pharmacist or care team how to get rid of this medication safely. NOTE: This sheet is a summary. It may not cover all possible information. If you have questions about this medicine, talk to your doctor, pharmacist, or health care provider.  2023 Elsevier/Gold Standard (2021-06-24 00:00:00)

## 2021-12-23 ENCOUNTER — Inpatient Hospital Stay: Payer: 59

## 2021-12-23 ENCOUNTER — Encounter: Payer: Self-pay | Admitting: Internal Medicine

## 2021-12-23 VITALS — BP 122/67 | HR 95 | Temp 97.9°F | Resp 16

## 2021-12-23 DIAGNOSIS — Z5111 Encounter for antineoplastic chemotherapy: Secondary | ICD-10-CM | POA: Diagnosis not present

## 2021-12-23 DIAGNOSIS — C3432 Malignant neoplasm of lower lobe, left bronchus or lung: Secondary | ICD-10-CM

## 2021-12-23 LAB — T4: T4, Total: 10.4 ug/dL (ref 4.5–12.0)

## 2021-12-23 MED ORDER — HEPARIN SOD (PORK) LOCK FLUSH 100 UNIT/ML IV SOLN
500.0000 [IU] | Freq: Once | INTRAVENOUS | Status: AC | PRN
  Administered 2021-12-23: 500 [IU]
  Filled 2021-12-23: qty 5

## 2021-12-23 MED ORDER — SODIUM CHLORIDE 0.9 % IV SOLN
10.0000 mg | Freq: Once | INTRAVENOUS | Status: AC
  Administered 2021-12-23: 10 mg via INTRAVENOUS
  Filled 2021-12-23: qty 10

## 2021-12-23 MED ORDER — SODIUM CHLORIDE 0.9 % IV SOLN
100.0000 mg/m2 | Freq: Once | INTRAVENOUS | Status: AC
  Administered 2021-12-23: 200 mg via INTRAVENOUS
  Filled 2021-12-23: qty 10

## 2021-12-23 MED ORDER — SODIUM CHLORIDE 0.9 % IV SOLN
Freq: Once | INTRAVENOUS | Status: AC
  Filled 2021-12-23: qty 250

## 2021-12-23 MED FILL — Dexamethasone Sodium Phosphate Inj 100 MG/10ML: INTRAMUSCULAR | Qty: 1 | Status: AC

## 2021-12-23 NOTE — Patient Instructions (Addendum)
[facility] CANCER CTR AT [place]  Discharge Instructions: Thank you for choosing [facility] to provide your oncology and hematology care.  If you have a lab appointment with the [facility], please go directly to the [facility] and check in at the registration area.  Wear comfortable clothing and clothing appropriate for easy access to any Portacath or PICC line.   We strive to give you quality time with your provider. You may need to reschedule your appointment if you arrive late (15 or more minutes).  Arriving late affects you and other patients whose appointments are after yours.  Also, if you miss three or more appointments without notifying the office, you may be dismissed from the clinic at the provider's discretion.      For prescription refill requests, have your pharmacy contact our office and allow 72 hours for refills to be completed.    Today you received the following chemotherapy and/or immunotherapy agents Vepesid     To help prevent nausea and vomiting after your treatment, we encourage you to take your nausea medication as directed.  BELOW ARE SYMPTOMS THAT SHOULD BE REPORTED IMMEDIATELY: *FEVER GREATER THAN 100.4 F (38 C) OR HIGHER *CHILLS OR SWEATING *NAUSEA AND VOMITING THAT IS NOT CONTROLLED WITH YOUR NAUSEA MEDICATION *UNUSUAL SHORTNESS OF BREATH *UNUSUAL BRUISING OR BLEEDING *URINARY PROBLEMS (pain or burning when urinating, or frequent urination) *BOWEL PROBLEMS (unusual diarrhea, constipation, pain near the anus) TENDERNESS IN MOUTH AND THROAT WITH OR WITHOUT PRESENCE OF ULCERS (sore throat, sores in mouth, or a toothache) UNUSUAL RASH, SWELLING OR PAIN  UNUSUAL VAGINAL DISCHARGE OR ITCHING   Items with * indicate a potential emergency and should be followed up as soon as possible or go to the Emergency Department if any problems should occur.  Please show the CHEMOTHERAPY ALERT CARD or IMMUNOTHERAPY ALERT CARD at check-in to the  Emergency Department and triage nurse.  Should you have questions after your visit or need to cancel or reschedule your appointment, please contact [facility] CANCER [place] AT [place]  [phone] and follow the prompts.  Office hours are 8:00 a.m. to 4:30 p.m. Monday - Friday. Please note that voicemails left after 4:00 p.m. may not be returned until the following business day.  We are closed weekends and major holidays. You have access to a nurse at all times for urgent questions. Please call the main number to the clinic [phone] and follow the prompts.  For any non-urgent questions, you may also contact your provider using MyChart. We now offer e-Visits for anyone 70 and older to request care online for non-urgent symptoms. For details visit [email or web address].   Also download the MyChart app! Go to the app store, search "MyChart", open the app, select [facility], and log in with your MyChart username and password.  Masks are optional in the cancer centers. If you would like for your care team to wear a mask while they are taking care of you, please let them know. For doctor visits, patients may have with them one support person who is at least 64 years old. At this time, visitors are not allowed in the infusion area.

## 2021-12-23 NOTE — Progress Notes (Signed)
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  INTERVAL HISTORY: Patient returns to clinic today for further evaluation and initiation of cycle 1 of carboplatinum, etoposide, and Tecentriq for palliative treatment of the above-stated malignancy.  She has chronic weakness and fatigue, but otherwise feels well.  She has no neurologic complaints.  She denies any recent fevers or illnesses.  She has a good appetite and denies weight loss.  She has no chest pain, shortness of breath, cough, or hemoptysis.  She denies any nausea, vomiting, constipation, or diarrhea.  She has no urinary complaints.  Patient offers no further specific complaints today.  REVIEW OF SYSTEMS:   Review of Systems  Constitutional:  Positive for malaise/fatigue. Negative for fever and weight loss.  Respiratory: Negative.  Negative for cough, hemoptysis and shortness of breath.   Cardiovascular: Negative.  Negative for chest pain and leg swelling.  Gastrointestinal: Negative.  Negative for abdominal pain.  Genitourinary: Negative.  Negative for dysuria.  Musculoskeletal: Negative.  Negative for back pain.  Skin: Negative.  Negative for rash.  Neurological: Negative.  Negative for dizziness, focal weakness, weakness and headaches.  Psychiatric/Behavioral: Negative.  The patient is not nervous/anxious.     As per HPI. Otherwise, a complete review of systems is negative.  PAST MEDICAL HISTORY: Past Medical History:  Diagnosis Date   Barrett's esophagus    COPD (chronic obstructive pulmonary disease)  ([place])    Dysrhythmia    stress related at work.   GERD (gastroesophageal reflux disease)    Hepatitis C 2008   [place] 2017   History of kidney stones    Hyperlipidemia    Hypertension    Hypothyroidism    Liver cancer ([place])    metastasized from Lung   Nephrolithiasis    Non-small cell carcinoma of left lung ([place])    Osteoporosis    Pulmonary mass    Thyroid cancer ([place]) 2012   Thyroid cancer ([place])    Tumor    tumor per pt back of rt eye   Type 2 diabetes mellitus ([place])    Wears dentures    Full upper and lower.  Usually only wears upper    PAST SURGICAL HISTORY: Past Surgical History:  Procedure Laterality Date   ABDOMINAL HYSTERECTOMY  2006   CATARACT EXTRACTION W/PHACO Right 12/08/2021   Procedure: CATARACT EXTRACTION PHACO AND INTRAOCULAR LENS PLACEMENT (IOC) RIGHT DIABETIC 6.62 00:35.1;  Surgeon: Birder Robson, MD;  Location: [facility];  Service: Ophthalmology;  Laterality: Right;  Diabetic   COLONOSCOPY WITH PROPOFOL N/A 04/25/2015   Procedure: COLONOSCOPY WITH PROPOFOL;  Surgeon: Josefine Class, MD;  Location: [facility] END[facility]COPY;  Service: Endoscopy;  Laterality: N/A;   CYSTOSCOPY W/ RETROGRADES Bilateral 05/19/2015   Procedure: CYSTOSCOPY WITH RETROGRADE PYELOGRAM;  Surgeon: Hollice Espy, MD;  Location: [facility];  Service: Urology;  Laterality: Bilateral;   CYSTOSCOPY W/ URETERAL STENT PLACEMENT Right 05/26/2015   Procedure: CYSTOSCOPY WITH STENT REPLACEMENT;  Surgeon: Hollice Espy, MD;  Location: [facility];  Service: Urology;  Laterality: Right;   CYSTOSCOPY WITH STENT PLACEMENT Right 05/19/2015   Procedure: CYSTOSCOPY WITH STENT PLACEMENT;  Surgeon: Hollice Espy, MD;  Location:  [facility];  Service: Urology;  Laterality: Right;   CYSTOSCOPY WITH STENT PLACEMENT Right 05/26/2015   Procedure: CYSTOSCOPY WITH STENT PLACEMENT;  Surgeon: Hollice Espy, MD;  Location: [facility];  Service: Urology;  Laterality: Right;   CYSTOSCOPY WITH STENT PLACEMENT Right  06/25/2016   Procedure: CYSTOSCOPY WITH STENT PLACEMENT;  Surgeon: Nickie Retort, MD;  Location: [facility];  Service: Urology;  Laterality: Right;   CYSTOSCOPY WITH STENT PLACEMENT Left 10/31/2017   Procedure: CYSTOSCOPY WITH STENT PLACEMENT;  Surgeon: Hollice Espy, MD;  Location: [facility];  Service: Urology;  Laterality: Left;   CYSTOSCOPY/URETEROSCOPY/HOLMIUM LASER/STENT PLACEMENT Left 07/23/2015   Procedure: CYSTOSCOPY/URETEROSCOPY/HOLMIUM LASER/STENT PLACEMENT/RETROGRADE PYELOGRAM;  Surgeon: Hollice Espy, MD;  Location: [facility];  Service: Urology;  Laterality: Left;   CYSTOSCOPY/URETEROSCOPY/HOLMIUM LASER/STENT PLACEMENT Left 11/16/2017   Procedure: CYSTOSCOPY/URETEROSCOPY/HOLMIUM LASER/STENT Exchange;  Surgeon: Hollice Espy, MD;  Location: [facility];  Service: Urology;  Laterality: Left;   ESOPHAGOGASTRODUODENOSCOPY (EGD) WITH PROPOFOL N/A 04/25/2015   Procedure: ESOPHAGOGASTRODUODENOSCOPY (EGD) WITH PROPOFOL;  Surgeon: Josefine Class, MD;  Location: [facility];  Service: Endoscopy;  Laterality: N/A;   EYE SURGERY Bilateral 1964   lazy eye repair   EYE SURGERY Bilateral 2001   lazy eye repair   IR IMAGING GUIDED PORT INSERTION  12/08/2020   LITHOTRIPSY  2009   THYROIDECTOMY  2010   URETEROSCOPY WITH HOLMIUM LASER LITHOTRIPSY Right 05/19/2015   Procedure: URETEROSCOPY WITH HOLMIUM LASER LITHOTRIPSY;  Surgeon: Hollice Espy, MD;  Location: [facility];  Service: Urology;  Laterality: Right;   URETEROSCOPY WITH HOLMIUM LASER LITHOTRIPSY Right 05/26/2015   Procedure: URETEROSCOPY WITH HOLMIUM LASER LITHOTRIPSY;  Surgeon: Hollice Espy, MD;  Location: [facility];  Service: Urology;  Laterality: Right;   URETEROSCOPY WITH HOLMIUM LASER LITHOTRIPSY Right 06/25/2016   Procedure: URETEROSCOPY WITH HOLMIUM LASER LITHOTRIPSY;  Surgeon: Nickie Retort, MD;  Location: [facility];  Service: Urology;  Laterality: Right;    FAMILY HISTORY: Family History  Problem Relation Age of Onset    CAD Mother    Heart disease Mother    Dementia Father    Bladder Cancer Neg Hx    Prostate cancer Neg Hx    Kidney cancer Neg Hx    Breast cancer Neg Hx     ADVANCED DIRECTIVES (Y/N):  N  HEALTH MAINTENANCE: Social History   Tobacco Use   Smoking status: Former    Packs/day: 1.00    Years: 50.00    Total pack years: 50.00    Types: Cigarettes    Quit date: 08/15/2021    Years since quitting: 0.3   Smokeless tobacco: Never   Tobacco comments:    Started smoking age 28  Vaping Use   Vaping Use: Former  Substance Use Topics   Alcohol use: Not Currently    Comment: socially   Drug use: No     Colonoscopy:  PAP:  Bone density:  Lipid panel:  No Known Allergies  Current Outpatient Medications  Medication Sig Dispense Refill   Accu-Chek Softclix Lancets lancets Check glucose once a day 100 each 12   albuterol (VENTOLIN HFA) 108 (90 Base) MCG/ACT inhaler Inhale 2 puffs into the lungs every 6 (six) hours as needed for wheezing or shortness of breath. 8 g 2   amLODipine (NORVASC) 5 MG tablet Take 5 mg by mouth every morning.      ARTIFICIAL TEAR OP Apply to eye.     atorvastatin (LIPITOR) 80 MG tablet Take 80 mg by mouth at bedtime.  benzonatate (TESSALON PERLES) 100 MG capsule Take 1 capsule (100 mg total) by mouth 3 (three) times daily as needed for cough. 90 capsule 1   blood glucose meter kit and supplies KIT Dispense based on patient and insurance preference. Use up to three times daily as directed. 1 each 0   buPROPion (WELLBUTRIN SR) 150 MG 12 hr tablet Take 150 mg by mouth daily.     cholecalciferol (VITAMIN D) 1000 units tablet Take 2,000 Units by mouth daily.     Cranberry-Vitamin C-Probiotic (AZO CRANBERRY PO) Take 1 tablet by mouth daily.     fluticasone-salmeterol (ADVAIR HFA) 45-21 MCG/ACT inhaler Inhale 2 puffs into the lungs 2 (two) times daily. 1 each 12   folic acid (FOLVITE) 1 MG tablet Take 1 tablet (1 mg total) by mouth daily. 90 tablet 1   gabapentin  (NEURONTIN) 300 MG capsule Take 1 pill at nighttime; if tolerating well-take twice a day.  Watch for drowsiness dizziness. 60 capsule 1   glipiZIDE (GLUCOTROL) 5 MG tablet Take 1 tablet (5 mg total) by mouth 2 (two) times daily before a meal. 180 tablet 1   HYDROcodone-acetaminophen (NORCO/VICODIN) 5-325 MG tablet Take 1 tablet by mouth every 8 (eight) hours as needed for moderate pain. 60 tablet 0   levothyroxine (SYNTHROID) 175 MCG tablet Saturday and sunday     levothyroxine (SYNTHROID, LEVOTHROID) 150 MCG tablet Take 150 mcg by mouth at bedtime. Mon-friday     lidocaine-prilocaine (EMLA) cream Apply 1 application topically as needed. 30 g 0   loperamide (IMODIUM) 2 MG capsule Take by mouth as needed for diarrhea or loose stools.     losartan (COZAAR) 100 MG tablet Take 100 mg by mouth every morning.      metoprolol succinate (TOPROL-XL) 25 MG 24 hr tablet Take 25 mg by mouth every morning.   1   montelukast (SINGULAIR) 10 MG tablet Take 1 tablet (10 mg total) by mouth at bedtime. 30 tablet 0   nystatin (MYCOSTATIN) 100000 UNIT/ML suspension Swish and swallow. Don't drink fluids immediately following administration. 400 mL 0   omeprazole (PRILOSEC) 20 MG capsule Take 20 mg by mouth daily before breakfast.      ondansetron (ZOFRAN) 8 MG tablet Take 1 tablet (8 mg total) by mouth every 8 (eight) hours as needed for nausea or vomiting. Start on the third day after chemotherapy 30 tablet 2   prochlorperazine (COMPAZINE) 10 MG tablet Take 1 tablet (10 mg total) by mouth every 6 (six) hours as needed for nausea or vomiting. 30 tablet 2   sucralfate (CARAFATE) 1 g tablet Take 1 tablet (1 g total) by mouth 4 (four) times daily -  with meals and at bedtime. (Patient taking differently: Take 1 g by mouth 4 (four) times daily as needed.) 60 tablet 2   No current facility-administered medications for this visit.   Facility-Administered Medications Ordered in Other Visits  Medication Dose Route Frequency  Provider Last Rate Last Admin   etoposide (VEPESID) 200 mg in sodium chloride 0.9 % 500 mL chemo infusion  100 mg/m2 (Treatment Plan Recorded) Intravenous Once Charlaine Dalton R, MD       heparin lock flush 100 UNIT/ML injection             OBJECTIVE: Vitals:   12/22/21 1018  BP: 110/77  Pulse: 86  Resp: 16  Temp: 98.5 F (36.9 C)  SpO2: 97%     Body mass index is 27.33 kg/m.    ECOG FS:1 -  Symptomatic but completely ambulatory  General: Well-developed, well-nourished, no acute distress. Eyes: Pink conjunctiva, anicteric sclera. HEENT: Normocephalic, moist mucous membranes. Lungs: No audible wheezing or coughing. Heart: Regular rate and rhythm. Abdomen: Soft, nontender, no obvious distention. Musculoskeletal: No edema, cyanosis, or clubbing. Neuro: Alert, answering all questions appropriately. Cranial nerves grossly intact. Skin: No rashes or petechiae noted. Psych: Normal affect.   LAB RESULTS:  Lab Results  Component Value Date   NA 139 12/22/2021   K 3.4 (L) 12/22/2021   CL 103 12/22/2021   CO2 25 12/22/2021   GLUCOSE 148 (H) 12/22/2021   BUN 10 12/22/2021   CREATININE 0.86 12/22/2021   CALCIUM 9.0 12/22/2021   PROT 6.9 12/22/2021   ALBUMIN 3.4 (L) 12/22/2021   AST 27 12/22/2021   ALT 15 12/22/2021   ALKPHOS 76 12/22/2021   BILITOT 0.5 12/22/2021   GFRNONAA >60 12/22/2021   GFRAA >60 05/11/2019    Lab Results  Component Value Date   WBC 8.9 12/22/2021   NEUTROABS 6.2 12/22/2021   HGB 11.9 (L) 12/22/2021   HCT 38.3 12/22/2021   MCV 103.2 (H) 12/22/2021   PLT 254 12/22/2021     STUDIES: US BIOPSY (LIVER)  Result Date: 12/11/2021 INDICATION: Lung cancer- new liver lesions EXAM: Ultrasound-guided liver lesion core needle biopsy MEDICATIONS: None. ANESTHESIA/SEDATION: Moderate (conscious) sedation was employed during this procedure. A total of Versed 1.5 mg and Fentanyl 50 mcg was administered intravenously by the radiology nurse. Total  intra-service moderate Sedation Time: 13 minutes. The patient's level of consciousness and vital signs were monitored continuously by radiology nursing throughout the procedure under my direct supervision. COMPLICATIONS: None immediate. PROCEDURE: Informed written consent was obtained from the patient after a thorough discussion of the procedural risks, benefits and alternatives. All questions were addressed. Maximal Sterile Barrier Technique was utilized including caps, mask, sterile gowns, sterile gloves, sterile drape, hand hygiene and skin antiseptic. A timeout was performed prior to the initiation of the procedure. The patient was placed supine on the exam table. Ultrasound of the liver demonstrated focal hypoechoic lesion in the left hepatic lobe, compatible with one of the masses identified on recent cross-sectional imaging. Skin entry site was marked, and the overlying skin was prepped draped in the standard sterile fashion. Local analgesia was obtained with 1% lidocaine. Using ultrasound guidance, a 17 gauge introducer needle was advanced towards the identified hypoechoic mass. Subsequently, core needle biopsy was performed of the hypoechoic mass in the left hepatic lobe using an 18 gauge core biopsy device x3 total passes. Specimens were submitted in formalin to pathology for further handling. Limited postprocedure imaging demonstrated no hematoma. A clean dressing was placed after manual hemostasis. The patient tolerated the procedure well without immediate complication. IMPRESSION: Successful ultrasound-guided core needle biopsy of focal lesion in the left hepatic lobe. Electronically Signed   By: Albin Felling M.D.   On: 12/11/2021 13:42   NM Bone Scan Whole Body  Result Date: 12/04/2021 CLINICAL DATA:  Non-small cell lung cancer. Recurrent lung cancer with hip pain. No history of fracture or recent trauma. EXAM: NUCLEAR MEDICINE WHOLE BODY BONE SCAN TECHNIQUE: Whole body anterior and posterior  images were obtained approximately 3 hours after intravenous injection of radiopharmaceutical. RADIOPHARMACEUTICALS:  19.7 mCi Technetium-9mMDP IV COMPARISON:  CT chest abdomen and pelvis 11/19/2021. Pelvis and hip radiographs 10/29/2021. PET-CT 07/23/2021 FINDINGS: Foci of mildly increased activity are demonstrated in the anterolateral left ninth rib, corresponding to an apparent old fracture on the recent CT. Another focus  of mildly increased activity is demonstrated in the posterior right ninth rib at the costovertebral junction. No corresponding radiographic abnormality is demonstrated suggesting that this likely represents degenerative change. No definitive metastatic bone uptake is identified. IMPRESSION: 1. No scintigraphic evidence of bone metastasis. Electronically Signed   By: Lucienne Capers M.D.   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  Subsequent liver biopsy on December 11, 2021 confirmed suspicion.  Patient previously expressed understanding of the minimal treatment options and poor prognosis, but wishes to proceed with cycle 1, day 1 of carboplatin, etoposide, and Tecentriq.  Return to clinic tomorrow and in 2 days for etoposide only.  Patient also received [place] support with the cycle.  Patient is going to [facility] for several weeks, so requested cycle 2, day 1 be delayed 1 week, therefore will return to clinic on January 19, 2022 to initiate cycle 2.  Pain: Nuclear med bone scan as above with no obvious evidence of bony metastasis.  Continue current narcotics and gabapentin as prescribed.  Patient was previously given a referral to orthopedics. COPD: Chronic and unchanged.  Continue current medications as prescribed. Cataract surgery: Patient reports she has cataract surgery on January 19, 2022.  This coincides with cycle 2, so  patient plans to postpone. Hypokalemia: Mild, monitor. Anemia: Chronic and unchanged.  Patient's most recent hemoglobin is 11.9. Thyroid disease: Patient's TSH remains persistently elevated, but T4 is within normal limits.  Continue current dose of Synthroid.  Patient expressed understanding and was in agreement with this plan. She also understands that She can call clinic at any time with any questions, concerns, or complaints.    Cancer Staging  Cancer of lower lobe of left lung [facility]) Staging form: Lung, AJCC 8th Edition - Clinical: Stage IVA (cT4, cN3, cM1a) - Signed by Cammie Sickle, MD on 12/10/2020   Lloyd Huger, MD   12/23/2021 1:21 PM

## 2021-12-24 ENCOUNTER — Other Ambulatory Visit: Payer: 59

## 2021-12-24 ENCOUNTER — Other Ambulatory Visit: Payer: Self-pay | Admitting: Medical Oncology

## 2021-12-24 ENCOUNTER — Inpatient Hospital Stay: Payer: 59

## 2021-12-24 VITALS — BP 124/99 | HR 92 | Temp 97.8°F | Resp 17

## 2021-12-24 DIAGNOSIS — C3432 Malignant neoplasm of lower lobe, left bronchus or lung: Secondary | ICD-10-CM

## 2021-12-24 DIAGNOSIS — Z5111 Encounter for antineoplastic chemotherapy: Secondary | ICD-10-CM | POA: Diagnosis not present

## 2021-12-24 MED ORDER — SODIUM CHLORIDE 0.9 % IV SOLN
100.0000 mg/m2 | Freq: Once | INTRAVENOUS | Status: AC
  Administered 2021-12-24: 200 mg via INTRAVENOUS
  Filled 2021-12-24: qty 10

## 2021-12-24 MED ORDER — HEPARIN SOD (PORK) LOCK FLUSH 100 UNIT/ML IV SOLN
500.0000 [IU] | Freq: Once | INTRAVENOUS | Status: AC | PRN
  Administered 2021-12-24: 500 [IU]
  Filled 2021-12-24: qty 5

## 2021-12-24 MED ORDER — GABAPENTIN 100 MG PO CAPS: 100.0000 mg | ORAL_CAPSULE | Freq: Two times a day (BID) | ORAL | 2 refills | Status: DC

## 2021-12-24 MED ORDER — SODIUM CHLORIDE 0.9 % IV SOLN
Freq: Once | INTRAVENOUS | Status: AC
  Filled 2021-12-24: qty 250

## 2021-12-24 MED ORDER — SODIUM CHLORIDE 0.9 % IV SOLN
10.0000 mg | Freq: Once | INTRAVENOUS | Status: AC
  Administered 2021-12-24: 10 mg via INTRAVENOUS
  Filled 2021-12-24: qty 10

## 2021-12-24 MED ORDER — HEPARIN SOD (PORK) LOCK FLUSH 100 UNIT/ML IV SOLN
INTRAVENOUS | Status: AC
  Filled 2021-12-24: qty 5

## 2021-12-24 NOTE — Progress Notes (Signed)
Pt reports that last night was the third night that she took the ordered Gabapentin, and that she has been dizzy since starting the medication. Pt reports that last night when she got up to go to the bathroom that she became dizzy and fell, pt reports that she hit her right hip and right cheek bone(face). Pt denies loss of consciousness, and denies pain in right cheek bone. Pt reports mild pain in right hip. Nelwyn Salisbury PA and Dr. Grayland Ormond aware. VS: B/P 124/99 O2 93%.  Per Nelwyn Salisbury PA proceed with Etoposide. Gabapentin decreased to 100mg  daily and Judson Roch PA will call in new prescription. Pt aware and verbalizes understanding. No new orders at this time.

## 2021-12-25 ENCOUNTER — Inpatient Hospital Stay: Payer: 59

## 2021-12-25 ENCOUNTER — Encounter: Payer: Self-pay | Admitting: Internal Medicine

## 2021-12-25 DIAGNOSIS — C3432 Malignant neoplasm of lower lobe, left bronchus or lung: Secondary | ICD-10-CM

## 2021-12-25 DIAGNOSIS — Z5111 Encounter for antineoplastic chemotherapy: Secondary | ICD-10-CM | POA: Diagnosis not present

## 2021-12-25 MED ORDER — PEGFILGRASTIM INJECTION 6 MG/0.6ML ~~LOC~~
6.0000 mg | PREFILLED_SYRINGE | Freq: Once | SUBCUTANEOUS | Status: AC
  Administered 2021-12-25: 6 mg via SUBCUTANEOUS
  Filled 2021-12-25: qty 0.6

## 2021-12-26 ENCOUNTER — Emergency Department: Payer: 59

## 2021-12-26 ENCOUNTER — Inpatient Hospital Stay
Admission: EM | Admit: 2021-12-26 | Discharge: 2021-12-29 | DRG: 871 | Disposition: A | Discharge status: Home / Self Care | Payer: 59 | Attending: Student in an Organized Health Care Education/Training Program | Admitting: Student in an Organized Health Care Education/Training Program

## 2021-12-26 ENCOUNTER — Other Ambulatory Visit: Payer: Self-pay

## 2021-12-26 DIAGNOSIS — Z87442 Personal history of urinary calculi: Secondary | ICD-10-CM

## 2021-12-26 DIAGNOSIS — Z9189 Other specified personal risk factors, not elsewhere classified: Secondary | ICD-10-CM

## 2021-12-26 DIAGNOSIS — R509 Fever, unspecified: Secondary | ICD-10-CM | POA: Diagnosis present

## 2021-12-26 DIAGNOSIS — D849 Immunodeficiency, unspecified: Secondary | ICD-10-CM | POA: Diagnosis present

## 2021-12-26 DIAGNOSIS — M810 Age-related osteoporosis without current pathological fracture: Secondary | ICD-10-CM | POA: Diagnosis present

## 2021-12-26 DIAGNOSIS — A419 Sepsis, unspecified organism: Secondary | ICD-10-CM | POA: Diagnosis present

## 2021-12-26 DIAGNOSIS — K2270 Barrett's esophagus without dysplasia: Secondary | ICD-10-CM | POA: Diagnosis present

## 2021-12-26 DIAGNOSIS — Z8249 Family history of ischemic heart disease and other diseases of the circulatory system: Secondary | ICD-10-CM | POA: Diagnosis not present

## 2021-12-26 DIAGNOSIS — E119 Type 2 diabetes mellitus without complications: Secondary | ICD-10-CM | POA: Diagnosis present

## 2021-12-26 DIAGNOSIS — Z9841 Cataract extraction status, right eye: Secondary | ICD-10-CM

## 2021-12-26 DIAGNOSIS — R6520 Severe sepsis without septic shock: Secondary | ICD-10-CM | POA: Diagnosis present

## 2021-12-26 DIAGNOSIS — D6481 Anemia due to antineoplastic chemotherapy: Secondary | ICD-10-CM | POA: Diagnosis present

## 2021-12-26 DIAGNOSIS — D630 Anemia in neoplastic disease: Secondary | ICD-10-CM | POA: Diagnosis present

## 2021-12-26 DIAGNOSIS — E039 Hypothyroidism, unspecified: Secondary | ICD-10-CM | POA: Diagnosis present

## 2021-12-26 DIAGNOSIS — Z7984 Long term (current) use of oral hypoglycemic drugs: Secondary | ICD-10-CM

## 2021-12-26 DIAGNOSIS — I10 Essential (primary) hypertension: Secondary | ICD-10-CM | POA: Diagnosis present

## 2021-12-26 DIAGNOSIS — Z87891 Personal history of nicotine dependence: Secondary | ICD-10-CM

## 2021-12-26 DIAGNOSIS — J440 Chronic obstructive pulmonary disease with acute lower respiratory infection: Secondary | ICD-10-CM | POA: Diagnosis present

## 2021-12-26 DIAGNOSIS — K219 Gastro-esophageal reflux disease without esophagitis: Secondary | ICD-10-CM | POA: Diagnosis present

## 2021-12-26 DIAGNOSIS — E785 Hyperlipidemia, unspecified: Secondary | ICD-10-CM | POA: Diagnosis present

## 2021-12-26 DIAGNOSIS — C801 Malignant (primary) neoplasm, unspecified: Secondary | ICD-10-CM | POA: Diagnosis not present

## 2021-12-26 DIAGNOSIS — Z85850 Personal history of malignant neoplasm of thyroid: Secondary | ICD-10-CM | POA: Diagnosis not present

## 2021-12-26 DIAGNOSIS — J189 Pneumonia, unspecified organism: Secondary | ICD-10-CM | POA: Diagnosis present

## 2021-12-26 DIAGNOSIS — Z90710 Acquired absence of both cervix and uterus: Secondary | ICD-10-CM | POA: Diagnosis not present

## 2021-12-26 DIAGNOSIS — Z20822 Contact with and (suspected) exposure to covid-19: Secondary | ICD-10-CM | POA: Diagnosis present

## 2021-12-26 DIAGNOSIS — N390 Urinary tract infection, site not specified: Secondary | ICD-10-CM | POA: Diagnosis present

## 2021-12-26 DIAGNOSIS — C3490 Malignant neoplasm of unspecified part of unspecified bronchus or lung: Secondary | ICD-10-CM | POA: Diagnosis present

## 2021-12-26 DIAGNOSIS — Z79890 Hormone replacement therapy (postmenopausal): Secondary | ICD-10-CM

## 2021-12-26 DIAGNOSIS — Z720 Tobacco use: Secondary | ICD-10-CM | POA: Diagnosis present

## 2021-12-26 DIAGNOSIS — C787 Secondary malignant neoplasm of liver and intrahepatic bile duct: Secondary | ICD-10-CM | POA: Diagnosis present

## 2021-12-26 DIAGNOSIS — Z79899 Other long term (current) drug therapy: Secondary | ICD-10-CM

## 2021-12-26 DIAGNOSIS — E890 Postprocedural hypothyroidism: Secondary | ICD-10-CM | POA: Diagnosis present

## 2021-12-26 LAB — CBC WITH DIFFERENTIAL/PLATELET
Abs Immature Granulocytes: 2.46 10*3/uL — ABNORMAL HIGH (ref 0.00–0.07)
Basophils Absolute: 0 10*3/uL (ref 0.0–0.1)
Basophils Relative: 0 %
Eosinophils Absolute: 0.1 10*3/uL (ref 0.0–0.5)
Eosinophils Relative: 0 %
HCT: 37.3 % (ref 36.0–46.0)
Hemoglobin: 12 g/dL (ref 12.0–15.0)
Immature Granulocytes: 8 %
Lymphocytes Relative: 3 %
Lymphs Abs: 1 10*3/uL (ref 0.7–4.0)
MCH: 32.1 pg (ref 26.0–34.0)
MCHC: 32.2 g/dL (ref 30.0–36.0)
MCV: 99.7 fL (ref 80.0–100.0)
Monocytes Absolute: 0 10*3/uL — ABNORMAL LOW (ref 0.1–1.0)
Monocytes Relative: 0 %
Neutro Abs: 27.9 10*3/uL — ABNORMAL HIGH (ref 1.7–7.7)
Neutrophils Relative %: 89 %
Platelets: 197 10*3/uL (ref 150–400)
RBC: 3.74 MIL/uL — ABNORMAL LOW (ref 3.87–5.11)
RDW: 16.5 % — ABNORMAL HIGH (ref 11.5–15.5)
Smear Review: NORMAL
WBC: 31.4 10*3/uL — ABNORMAL HIGH (ref 4.0–10.5)
nRBC: 0 % (ref 0.0–0.2)

## 2021-12-26 LAB — COMPREHENSIVE METABOLIC PANEL
ALT: 18 U/L (ref 0–44)
AST: 32 U/L (ref 15–41)
Albumin: 3.5 g/dL (ref 3.5–5.0)
Alkaline Phosphatase: 78 U/L (ref 38–126)
Anion gap: 9 (ref 5–15)
BUN: 18 mg/dL (ref 8–23)
CO2: 23 mmol/L (ref 22–32)
Calcium: 9.1 mg/dL (ref 8.9–10.3)
Chloride: 107 mmol/L (ref 98–111)
Creatinine, Ser: 0.71 mg/dL (ref 0.44–1.00)
GFR, Estimated: >60 mL/min (ref >60)
Glucose, Bld: 77 mg/dL (ref 70–99)
Potassium: 3.2 mmol/L — ABNORMAL LOW (ref 3.5–5.1)
Sodium: 139 mmol/L (ref 135–145)
Total Bilirubin: 1 mg/dL (ref 0.3–1.2)
Total Protein: 6.9 g/dL (ref 6.5–8.1)

## 2021-12-26 LAB — PROTIME-INR
INR: 1 (ref 0.8–1.2)
Prothrombin Time: 13.5 seconds (ref 11.4–15.2)

## 2021-12-26 LAB — LACTIC ACID, PLASMA: Lactic Acid, Venous: 3.5 mmol/L (ref 0.5–1.9)

## 2021-12-26 LAB — RESP PANEL BY RT-PCR (FLU A&B, COVID) ARPGX2
Influenza A by PCR: NEGATIVE
Influenza B by PCR: NEGATIVE
SARS Coronavirus 2 by RT PCR: NEGATIVE

## 2021-12-26 LAB — TROPONIN I (HIGH SENSITIVITY)
Troponin I (High Sensitivity): 7 ng/L (ref <18)
Troponin I (High Sensitivity): 6 ng/L (ref <18)

## 2021-12-26 LAB — APTT: aPTT: 26 seconds (ref 24–36)

## 2021-12-26 MED ORDER — ALBUTEROL SULFATE (2.5 MG/3ML) 0.083% IN NEBU: 2.5000 mg | INHALATION_SOLUTION | Freq: Four times a day (QID) | RESPIRATORY_TRACT | Status: DC | PRN

## 2021-12-26 MED ORDER — GABAPENTIN 100 MG PO CAPS
100.0000 mg | ORAL_CAPSULE | Freq: Two times a day (BID) | ORAL | Status: DC
  Administered 2021-12-27 – 2021-12-29 (×6): 100 mg via ORAL
  Filled 2021-12-26 (×7): qty 1

## 2021-12-26 MED ORDER — IOHEXOL 350 MG/ML SOLN
75.0000 mL | Freq: Once | INTRAVENOUS | Status: AC | PRN
  Administered 2021-12-26: 75 mL via INTRAVENOUS

## 2021-12-26 MED ORDER — SODIUM CHLORIDE 0.9 % IV SOLN
2.0000 g | Freq: Three times a day (TID) | INTRAVENOUS | Status: AC
  Administered 2021-12-27 – 2021-12-28 (×5): 2 g via INTRAVENOUS
  Filled 2021-12-26 (×2): qty 2
  Filled 2021-12-26: qty 12.5
  Filled 2021-12-26: qty 2
  Filled 2021-12-26: qty 12.5

## 2021-12-26 MED ORDER — LACTATED RINGERS IV SOLN: INTRAVENOUS | Status: AC

## 2021-12-26 MED ORDER — ONDANSETRON HCL 4 MG/2ML IJ SOLN
4.0000 mg | Freq: Once | INTRAMUSCULAR | Status: AC
  Administered 2021-12-26: 4 mg via INTRAVENOUS
  Filled 2021-12-26: qty 2

## 2021-12-26 MED ORDER — MORPHINE SULFATE (PF) 4 MG/ML IV SOLN
4.0000 mg | Freq: Once | INTRAVENOUS | Status: AC
  Administered 2021-12-26: 4 mg via INTRAVENOUS
  Filled 2021-12-26: qty 1

## 2021-12-26 MED ORDER — METOPROLOL SUCCINATE ER 25 MG PO TB24
25.0000 mg | ORAL_TABLET | ORAL | Status: DC
  Administered 2021-12-27 – 2021-12-29 (×3): 25 mg via ORAL
  Filled 2021-12-26 (×4): qty 1

## 2021-12-26 MED ORDER — BISACODYL 5 MG PO TBEC: 5.0000 mg | DELAYED_RELEASE_TABLET | Freq: Every day | ORAL | Status: DC | PRN

## 2021-12-26 MED ORDER — POLYETHYLENE GLYCOL 3350 17 G PO PACK: 17.0000 g | PACK | Freq: Every day | ORAL | Status: DC | PRN

## 2021-12-26 MED ORDER — VANCOMYCIN HCL 1750 MG/350ML IV SOLN
1750.0000 mg | Freq: Once | INTRAVENOUS | Status: AC
  Administered 2021-12-26: 1750 mg via INTRAVENOUS
  Filled 2021-12-26: qty 350

## 2021-12-26 MED ORDER — ATORVASTATIN CALCIUM 20 MG PO TABS
80.0000 mg | ORAL_TABLET | Freq: Every day | ORAL | Status: DC
  Filled 2021-12-26 (×2): qty 4

## 2021-12-26 MED ORDER — HEPARIN SODIUM (PORCINE) 5000 UNIT/ML IJ SOLN
5000.0000 [IU] | Freq: Three times a day (TID) | INTRAMUSCULAR | Status: DC
  Administered 2021-12-27: 5000 [IU] via SUBCUTANEOUS
  Filled 2021-12-26: qty 1

## 2021-12-26 MED ORDER — DOCUSATE SODIUM 100 MG PO CAPS
100.0000 mg | ORAL_CAPSULE | Freq: Two times a day (BID) | ORAL | Status: DC
  Administered 2021-12-27: 100 mg via ORAL
  Filled 2021-12-26: qty 1

## 2021-12-26 MED ORDER — MONTELUKAST SODIUM 10 MG PO TABS
10.0000 mg | ORAL_TABLET | Freq: Every day | ORAL | Status: DC
  Administered 2021-12-27 – 2021-12-28 (×3): 10 mg via ORAL
  Filled 2021-12-26 (×3): qty 1

## 2021-12-26 MED ORDER — ACETAMINOPHEN 650 MG RE SUPP: 650.0000 mg | Freq: Four times a day (QID) | RECTAL | Status: DC | PRN

## 2021-12-26 MED ORDER — ACETAMINOPHEN 325 MG PO TABS
650.0000 mg | ORAL_TABLET | Freq: Four times a day (QID) | ORAL | Status: DC | PRN
  Administered 2021-12-27 – 2021-12-28 (×4): 650 mg via ORAL
  Filled 2021-12-26 (×5): qty 2

## 2021-12-26 MED ORDER — SODIUM CHLORIDE 0.9 % IV BOLUS (SEPSIS)
1000.0000 mL | Freq: Once | INTRAVENOUS | Status: AC
  Administered 2021-12-26: 1000 mL via INTRAVENOUS

## 2021-12-26 MED ORDER — VANCOMYCIN HCL 1750 MG/350ML IV SOLN: 1750.0000 mg | INTRAVENOUS | Status: DC

## 2021-12-26 MED ORDER — PANTOPRAZOLE SODIUM 40 MG PO TBEC
40.0000 mg | DELAYED_RELEASE_TABLET | Freq: Every day | ORAL | Status: DC
  Administered 2021-12-27 – 2021-12-29 (×3): 40 mg via ORAL
  Filled 2021-12-26 (×4): qty 1

## 2021-12-26 MED ORDER — LOSARTAN POTASSIUM 50 MG PO TABS
100.0000 mg | ORAL_TABLET | ORAL | Status: DC
  Administered 2021-12-28 – 2021-12-29 (×2): 100 mg via ORAL
  Filled 2021-12-26 (×3): qty 2

## 2021-12-26 MED ORDER — SODIUM CHLORIDE 0.9 % IV SOLN
2.0000 g | Freq: Once | INTRAVENOUS | Status: AC
  Administered 2021-12-26: 2 g via INTRAVENOUS
  Filled 2021-12-26: qty 12.5

## 2021-12-26 MED ORDER — SODIUM CHLORIDE 0.9 % IV SOLN
500.0000 mg | INTRAVENOUS | Status: DC
  Administered 2021-12-27 (×2): 500 mg via INTRAVENOUS
  Filled 2021-12-26: qty 5
  Filled 2021-12-26: qty 500

## 2021-12-26 MED ORDER — VANCOMYCIN HCL IN DEXTROSE 1-5 GM/200ML-% IV SOLN: 1000.0000 mg | Freq: Once | INTRAVENOUS | Status: DC

## 2021-12-26 MED ORDER — INSULIN ASPART 100 UNIT/ML IJ SOLN
0.0000 [IU] | Freq: Three times a day (TID) | INTRAMUSCULAR | Status: DC
  Administered 2021-12-28: 2 [IU] via SUBCUTANEOUS
  Filled 2021-12-26 (×2): qty 1

## 2021-12-26 MED ORDER — LEVOTHYROXINE SODIUM 50 MCG PO TABS
175.0000 ug | ORAL_TABLET | Freq: Every day | ORAL | Status: DC
  Administered 2021-12-27 – 2021-12-29 (×3): 175 ug via ORAL
  Filled 2021-12-26 (×3): qty 1

## 2021-12-26 MED ORDER — LACTATED RINGERS IV SOLN: INTRAVENOUS | Status: DC

## 2021-12-26 MED ORDER — ONDANSETRON HCL 4 MG PO TABS: 8.0000 mg | ORAL_TABLET | Freq: Three times a day (TID) | ORAL | Status: DC | PRN

## 2021-12-26 NOTE — ED Notes (Signed)
Pt is refusing IV stick for labs. Pt would like her port accessed. This RN asked charge RN who advised against accessing port in triage.

## 2021-12-26 NOTE — Assessment & Plan Note (Signed)
Vitals:   12/26/21 1814 12/26/21 2030 12/26/21 2100 12/26/21 2213  BP: (!) 142/72 114/67 123/72 125/64  Continue patient on metoprolol 25 mg daily,  Cozaar 100 mg daily. Lab Results  Component Value Date   CREATININE 0.71 12/26/2021   CREATININE 0.86 12/22/2021   CREATININE 0.88 12/16/2021

## 2021-12-26 NOTE — ED Provider Triage Note (Signed)
Emergency Medicine Provider Triage Evaluation Note  Felicia Manning , a 66 y.o. female  was evaluated in triage.  H/o progressive stage IV small cell lung cancer. Pt complains of dizziness, cough, and rib pain. Has been taking hydrocodone. Had a new chemo treatment a few days ago. Pain is in left posterior ribs. No fever.   Review of Systems  Positive: Rib/back pain, cough Negative: fever  Physical Exam  There were no vitals taken for this visit. Gen:   Awake, no distress   Resp:  Normal effort  MSK:   Moves extremities without difficulty  Other:    Medical Decision Making  Medically screening exam initiated at 6:11 PM.  Appropriate orders placed.  Damon Tomaso was informed that the remainder of the evaluation will be completed by another provider, this initial triage assessment does not replace that evaluation, and the importance of remaining in the ED until their evaluation is complete.     Marquette Old, PA-C 12/26/21 1816

## 2021-12-26 NOTE — ED Notes (Signed)
Pt does have chemo port that is available for access.

## 2021-12-26 NOTE — ED Notes (Signed)
Implanted port accessed with blood drawn and flushed with NS.  Tegaderm in place.

## 2021-12-26 NOTE — ED Provider Notes (Signed)
[facility] Provider Note   Event Date/Time   First MD Initiated Contact with Patient 12/26/21 2014     (approximate) History  Cough  HPI Felicia Manning is a 66 y.o. female with a past medical history of left-sided lung cancer currently on chemotherapy and seeing Dr. Rogue Bussing and oncology who presents for subjective fever and chills, productive cough, and left-sided rib pain that has been worsening over the last week.  Patient states that she is concerned as she just started a new chemotherapy regimen that has caused more nausea and vomiting.  Patient states that this nausea/vomiting is well controlled by her prescribed antiemetics.  Patient also endorses worsening generalized weakness over this time as well ROS: Patient currently denies any vision changes, tinnitus, difficulty speaking, facial droop, sore throat, shortness of breath, abdominal pain, nausea/vomiting/diarrhea, dysuria, or numbness/paresthesias in any extremity   Physical Exam  Triage Vital Signs: ED Triage Vitals [12/26/21 1814]  Enc Vitals Group     BP (!) 142/72     Pulse Rate 92     Resp 16     Temp 98 F (36.7 C)     Temp Source Oral     SpO2 100 %     Weight 174 lb (78.9 kg)     Height 5\' 7"  (1.702 m)     Head Circumference      Peak Flow      Pain Score 10     Pain Loc      Pain Edu?      Excl. in [place]?    Most recent vital signs: Vitals:   12/26/21 2213 12/26/21 2214  BP: 125/64   Pulse: 64   Resp: 15   Temp:  97.9 F (36.6 C)  SpO2: 97%    General: Awake, oriented x4. CV:  Good peripheral perfusion.  Resp:  Normal effort.  Abd:  No distention.  Other:  Middle-aged overweight Caucasian female laying in bed in no acute distress ED Results / Procedures / Treatments  Labs (all labs ordered are listed, but only abnormal results are displayed) Labs Reviewed  COMPREHENSIVE METABOLIC PANEL - Abnormal; Notable for the following components:      Result Value   Potassium  3.2 (*)    All other components within normal limits  CBC WITH DIFFERENTIAL/PLATELET - Abnormal; Notable for the following components:   WBC 31.4 (*)    RBC 3.74 (*)    RDW 16.5 (*)    Neutro Abs 27.9 (*)    Monocytes Absolute 0.0 (*)    Abs Immature Granulocytes 2.46 (*)    All other components within normal limits  LACTIC ACID, PLASMA - Abnormal; Notable for the following components:   Lactic Acid, Venous 3.5 (*)    All other components within normal limits  RESP PANEL BY RT-PCR (FLU A&B, COVID) ARPGX2  CULTURE, BLOOD (ROUTINE X 2)  CULTURE, BLOOD (ROUTINE X 2)  PROTIME-INR  APTT  LACTIC ACID, PLASMA  URINALYSIS, COMPLETE (UACMP) WITH MICROSCOPIC  HEMOGLOBIN A1C  HIV ANTIBODY (ROUTINE TESTING W REFLEX)  CORTISOL-AM, BLOOD  PROCALCITONIN  TROPONIN I (HIGH SENSITIVITY)  TROPONIN I (HIGH SENSITIVITY)   EKG ED ECG REPORT I, Naaman Plummer, the attending physician, personally viewed and interpreted this ECG. Date: 12/26/2021 EKG Time: 1823 Rate: 91 Rhythm: normal sinus rhythm QRS Axis: normal Intervals: normal ST/T Wave abnormalities: normal Narrative Interpretation: no evidence of acute ischemia RADIOLOGY ED MD interpretation: 2 view chest x-ray shows patchy left lower  lobe infiltrate that is new from previous.  This image was interpreted independently by me  CT angiography of the chest interpreted independently by me shows no evidence of pulmonary embolus however does show multiple pulmonary nodules with interval enlargement of the left lower lobe nodule consistent with progression of disease there are progressive hepatic metastatic disease as well as progressive bilateral subcentimeter groundglass nodular opacities greatest in the upper lobes which could reflect possible metastatic disease versus inflammatory/infectious process -Agree with radiology assessment Official radiology report(s): CT Angio Chest PE W/Cm &/Or Wo Cm  Result Date: 12/26/2021 CLINICAL DATA:   Metastatic left lower lobe non-small cell lung cancer, dizziness, cough, left posterior rib pain EXAM: CT ANGIOGRAPHY CHEST WITH CONTRAST TECHNIQUE: Multidetector CT imaging of the chest was performed using the standard protocol during bolus administration of intravenous contrast. Multiplanar CT image reconstructions and MIPs were obtained to evaluate the vascular anatomy. RADIATION DOSE REDUCTION: This exam was performed according to the departmental dose-optimization program which includes automated exposure control, adjustment of the mA and/or kV according to patient size and/or use of iterative reconstruction technique. CONTRAST:  61mL OMNIPAQUE IOHEXOL 350 MG/ML SOLN COMPARISON:  11/19/2021, 12/02/2021 FINDINGS: Cardiovascular: This is a technically adequate evaluation of the pulmonary vasculature. No filling defects or pulmonary emboli. The heart is unremarkable without pericardial effusion. No evidence of thoracic aortic aneurysm or dissection. Stable atherosclerosis. 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 Right chest wall port via internal jugular approach, tip within the atriocaval junction. Mediastinum/Nodes: Status post thyroidectomy. Trachea and esophagus are unremarkable. Small hiatal hernia. No pathologic mediastinal, hilar, or axillary adenopathy. Lungs/Pleura: Left lower lobe consolidation in the posterior costophrenic angle obscures the known underlying nodule, with consolidation now measuring 2.7 x 1.6 cm image 121/5 not appreciably changed since prior exam. Other bilateral pulmonary nodules are again identified. Index nodules are as follows: Left lower lobe, image 126/5, 1.3 x 1.0 cm. Previously 1.1 x 0.9 cm. Right upper lobe, image 35/5, 1.0 x 0.7 cm. Previously 1.1 x 0.9 cm. There are multiple new subcentimeter ground-glass nodules throughout the bilateral upper lobes, concerning for progressive metastatic disease over underlying inflammation  or infection. No effusion or pneumothorax.  Central airways are patent. Upper Abdomen: There are multiple new and enlarging hypodense lesions throughout the liver parenchyma consistent with progressive metastatic disease. Index lesion within the left lobe liver image 132/4 measures 3.7 x 3.0 cm, previously 1.4 x 1.3 cm. Musculoskeletal: There are no acute or destructive bony lesions. Reconstructed images demonstrate no additional findings. Review of the MIP images confirms the above findings. IMPRESSION: 1. No evidence of pulmonary embolus. 2. Multiple pulmonary nodules as above, with interval enlargement of the left lower lobe nodule consistent with progression of disease. 3. Progressive hepatic metastatic disease. 4. Progressive bilateral subcentimeter ground-glass nodularity, greatest in the upper lobes, which could reflect progressive metastatic disease versus nonspecific inflammatory/infectious etiology. 5. Aortic Atherosclerosis (ICD10-I70.0). 6. Hiatal hernia. Electronically Signed   By: Randa Ngo M.D.   On: 12/26/2021 21:53   DG Chest 2 View  Result Date: 12/26/2021 CLINICAL DATA:  c/o fall 3-4 days ago, low back and rib pain since that time. Also endorses nausea since chemo treatments changed several days ago. EXAM: CHEST - 2 VIEW COMPARISON:  03/26/2019 FINDINGS: Interval placement of right IJ power port to the distal SVC. No pneumothorax. Patchy airspace infiltrate in the posterior left lower lobe, new since previous. Right lung clear. Heart size and mediastinal contours are  within normal limits. No effusion. Visualized bones unremarkable.  No pneumothorax. IMPRESSION: Patchy left lower lobe infiltrate, new since previous. Electronically Signed   By: Lucrezia Europe M.D.   On: 12/26/2021 19:15   PROCEDURES: Critical Care performed: Yes, see critical care procedure note(s) .1-3 Lead EKG Interpretation  Performed by: Naaman Plummer, MD Authorized by: Naaman Plummer, MD     Interpretation:  normal     ECG rate:  63   ECG rate assessment: normal     Rhythm: sinus rhythm     Ectopy: none     Conduction: normal   CRITICAL CARE Performed by: Naaman Plummer  Total critical care time: 33 minutes  Critical care time was exclusive of separately billable procedures and treating other patients.  Critical care was necessary to treat or prevent imminent or life-threatening deterioration.  Critical care was time spent personally by me on the following activities: development of treatment plan with patient and/or surrogate as well as nursing, discussions with consultants, evaluation of patient's response to treatment, examination of patient, obtaining history from patient or surrogate, ordering and performing treatments and interventions, ordering and review of laboratory studies, ordering and review of radiographic studies, pulse oximetry and re-evaluation of patient's condition.  MEDICATIONS ORDERED IN ED: Medications  vancomycin (VANCOREADY) IVPB 1750 mg/350 mL (1,750 mg Intravenous New Bag/Given 12/26/21 2208)  lactated ringers infusion (has no administration in time range)  albuterol (VENTOLIN HFA) 108 (90 Base) MCG/ACT inhaler 2 puff (has no administration in time range)  atorvastatin (LIPITOR) tablet 80 mg (has no administration in time range)  gabapentin (NEURONTIN) capsule 100 mg (has no administration in time range)  levothyroxine (SYNTHROID) tablet 175 mcg (has no administration in time range)  metoprolol succinate (TOPROL-XL) 24 hr tablet 25 mg (has no administration in time range)  losartan (COZAAR) tablet 100 mg (has no administration in time range)  montelukast (SINGULAIR) tablet 10 mg (has no administration in time range)  pantoprazole (PROTONIX) EC tablet 40 mg (has no administration in time range)  ondansetron (ZOFRAN) tablet 8 mg (has no administration in time range)  insulin aspart (novoLOG) injection 0-15 Units (has no administration in time range)  acetaminophen  (TYLENOL) tablet 650 mg (has no administration in time range)    Or  acetaminophen (TYLENOL) suppository 650 mg (has no administration in time range)  docusate sodium (COLACE) capsule 100 mg (has no administration in time range)  polyethylene glycol (MIRALAX / GLYCOLAX) packet 17 g (has no administration in time range)  bisacodyl (DULCOLAX) EC tablet 5 mg (has no administration in time range)  heparin injection 5,000 Units (has no administration in time range)  azithromycin (ZITHROMAX) 500 mg in sodium chloride 0.9 % 250 mL IVPB (has no administration in time range)  ceFEPIme (MAXIPIME) 2 g in sodium chloride 0.9 % 100 mL IVPB (has no administration in time range)  sodium chloride 0.9 % bolus 1,000 mL (0 mLs Intravenous Stopped 12/26/21 2215)  ceFEPIme (MAXIPIME) 2 g in sodium chloride 0.9 % 100 mL IVPB (0 g Intravenous Stopped 12/26/21 2145)  iohexol (OMNIPAQUE) 350 MG/ML injection 75 mL (75 mLs Intravenous Contrast Given 12/26/21 2122)  morphine (PF) 4 MG/ML injection 4 mg (4 mg Intravenous Given 12/26/21 2143)  ondansetron (ZOFRAN) injection 4 mg (4 mg Intravenous Given 12/26/21 2144)   IMPRESSION / MDM / ASSESSMENT AND PLAN / ED COURSE  I reviewed the triage vital signs and the nursing notes.  The patient is on the cardiac monitor to evaluate for evidence of arrhythmia and/or significant heart rate changes. Patient's presentation is most consistent with acute presentation with potential threat to life or bodily function. Presents with shortness of breath, cough, and malaise concerning for pneumonia.  DDx: PE, COPD exacerbation, Pneumothorax, TB, Atypical ACS, Esophageal Rupture, Toxic Exposure, Foreign Body Airway Obstruction.  Workup: CXR CBC, CMP, lactate, troponin  Given History, Exam, and Workup presentation most consistent with pneumonia.  Findings: Chest x-ray showing new left lower lobe infiltrate CT angiography of the chest showing progression  of patient's metastatic disease as well as new infiltrate in the bilateral upper lobes  Tx: Cefdinir, vancomycin  Disposition: Admit   FINAL CLINICAL IMPRESSION(S) / ED DIAGNOSES   Final diagnoses:  Pneumonia of left upper lobe due to infectious organism  Sepsis without acute organ dysfunction, due to unspecified organism [facility])   Rx / DC Orders   ED Discharge Orders     None      Note:  This document was prepared using Dragon voice recognition software and may include unintentional dictation errors.   Naaman Plummer, MD 12/26/21 [id number]

## 2021-12-26 NOTE — Assessment & Plan Note (Signed)
Continue patient on levothyroxine dose verification needs to be done by med rec so the medication can be restarted by either pharmacy or a.m. team.

## 2021-12-26 NOTE — Consult Note (Signed)
CODE SEPSIS - PHARMACY COMMUNICATION  **Broad Spectrum Antibiotics should be administered within 1 hour of Sepsis diagnosis**  Time Code Sepsis Called/Page Received: 2050  Antibiotics Ordered: cefepime, vancomycin  Time of 1st antibiotic administration: 2108     Dorothe Pea ,PharmD Clinical Pharmacist  12/26/2021  8:50 PM

## 2021-12-26 NOTE — Sepsis Progress Note (Signed)
Elink following for sepsis protocol. 

## 2021-12-26 NOTE — Assessment & Plan Note (Signed)
Suspect postobstructive PNA. Cont with IV abx nad supportive care with Nebs and MDI and antitussive and CPT.

## 2021-12-26 NOTE — ED Notes (Signed)
FIRST NURSE: Pt to ER via EMS with c/o fall 3-4 days ago, low back and rib pain since that time.  Also endorses nausea since chemo treatments changed several days ago.

## 2021-12-26 NOTE — Assessment & Plan Note (Signed)
IV PPI. 

## 2021-12-26 NOTE — ED Triage Notes (Signed)
Pt is a chemo patient. Treatment changed a few days ago and she has since been having nausea and increased pain due to coughing. She called her oncologist but never spoke to them, the EMS crew did. Pt has pains in her ribs on left side mostly in back from coughing. Pt able to speak in complete sentences and point to location. Pt has been taking her prescribed hydrocodone today with no relief.

## 2021-12-26 NOTE — Consult Note (Signed)
PHARMACY -  BRIEF ANTIBIOTIC NOTE   Pharmacy has received consult(s) for cefepime and vancomycin from an ED provider.  The patient's profile has been reviewed for ht/wt/allergies/indication/available labs.    One time order(s) placed for  Cefepime 2 gram Vancomycin 1750 mg   Further antibiotics/pharmacy consults should be ordered by admitting physician if indicated.                       Thank you, Dorothe Pea, PharmD, BCPS Clinical Pharmacist   12/26/2021  8:51 PM

## 2021-12-26 NOTE — Assessment & Plan Note (Signed)
-  Nicotine patch 

## 2021-12-26 NOTE — Hospital Course (Addendum)
Admission request for : H/o Left sided lung cancer.  Fever x cough and PNA. N/V vomiting after  chemo. Pt meets sever sepsis.  Cta chest: No pe and progression of her chemo.  Pt has received iv vancomycin cefepime and morphine and zofran

## 2021-12-26 NOTE — Progress Notes (Signed)
Pharmacy Antibiotic Note  Felicia Manning is a 66 y.o. female admitted on 12/26/2021 with sepsis.  Pharmacy has been consulted for Vancomycin, Cefepime dosing.  Plan: Cefepime 2 gm IV X 1 given in ED on 11/11 @ 2100. Cefepime 2 gm IV Q8H ordered to start on 11/12 @ 0500.  Vancomycin 1750 mg IV X 1 given in ED on 11/11 @ 2200. Vancomycin 1750 mg IV Q24H ordered to start on 11/12 @ 2200.  AUC = 498.9 Vanc trough = 10.2   Height: 5\' 7"  (170.2 cm) Weight: 78.9 kg (174 lb) IBW/kg (Calculated) : 61.6  Temp (24hrs), Avg:98 F (36.7 C), Min:97.9 F (36.6 C), Max:98 F (36.7 C)  Recent Labs  Lab 12/22/21 0939 12/26/21 2018 12/26/21 2044  WBC 8.9 31.4*  --   CREATININE 0.86 0.71  --   LATICACIDVEN  --   --  3.5*    Estimated Creatinine Clearance: 76.8 mL/min (by C-G formula based on SCr of 0.71 mg/dL).    No Known Allergies  Antimicrobials this admission:   >>    >>   Dose adjustments this admission:   Microbiology results:  BCx:   UCx:    Sputum:    MRSA PCR:   Thank you for allowing pharmacy to be a part of this patient's care.  Angelina Venard D 12/26/2021 11:22 PM

## 2021-12-26 NOTE — H&P (Signed)
History and Physical    Chief Complaint: Fever    HISTORY OF PRESENT ILLNESS: Felicia Manning is an 66 y.o. female seen for fever, cough, nausea,vomiting. Received chemo few days ago. Fall few 4 days ago because of weakness. This nausea is worse with treatment change few days ago.pt reports rib pain,cough and sob.  Pt denies iv access.   Pt has PMH as below: Past Medical History:  Diagnosis Date   Barrett's esophagus    COPD (chronic obstructive pulmonary disease) ([place])    Dysrhythmia    stress related at work.   GERD (gastroesophageal reflux disease)    H/O malignant neoplasm of thyroid 01/29/2014   Hepatitis C 2008   Harvoni 2017   History of kidney stones    Hyperlipidemia    Hypertension    Hypothyroidism    Liver cancer ([place])    metastasized from Lung   Nephrolithiasis    Non-small cell carcinoma of left lung ([place])    Osteoporosis    Pulmonary mass    Thyroid cancer ([place]) 2012   Thyroid cancer ([place])    Tumor    tumor per pt back of rt eye   Type 2 diabetes mellitus ([place])    Wears dentures    Full upper and lower.  Usually only wears upper   Review of Systems  Constitutional:  Positive for fever.  Respiratory:  Positive for cough and shortness of breath.   All other systems reviewed and are negative.  No Known Allergies  Past Surgical History:  Procedure Laterality Date   ABDOMINAL HYSTERECTOMY  2006   CATARACT EXTRACTION W/PHACO Right 12/08/2021   Procedure: CATARACT EXTRACTION PHACO AND INTRAOCULAR LENS PLACEMENT (IOC) RIGHT DIABETIC 6.62 00:35.1;  Surgeon: Birder Robson, MD;  Location: [place];  Service: Ophthalmology;  Laterality: Right;  Diabetic   COLONOSCOPY WITH PROPOFOL N/A 04/25/2015   Procedure: COLONOSCOPY WITH PROPOFOL;  Surgeon: Josefine Class, MD;  Location: [facility] ENDOSCOPY;  Service: Endoscopy;  Laterality: N/A;   CYSTOSCOPY W/ RETROGRADES Bilateral 05/19/2015   Procedure: CYSTOSCOPY WITH RETROGRADE PYELOGRAM;  Surgeon:  Hollice Espy, MD;  Location: [facility];  Service: Urology;  Laterality: Bilateral;   CYSTOSCOPY W/ URETERAL STENT PLACEMENT Right 05/26/2015   Procedure: CYSTOSCOPY WITH STENT REPLACEMENT;  Surgeon: Hollice Espy, MD;  Location: [facility];  Service: Urology;  Laterality: Right;   CYSTOSCOPY WITH STENT PLACEMENT Right 05/19/2015   Procedure: CYSTOSCOPY WITH STENT PLACEMENT;  Surgeon: Hollice Espy, MD;  Location: [facility];  Service: Urology;  Laterality: Right;   CYSTOSCOPY WITH STENT PLACEMENT Right 05/26/2015   Procedure: CYSTOSCOPY WITH STENT PLACEMENT;  Surgeon: Hollice Espy, MD;  Location: [facility];  Service: Urology;  Laterality: Right;   CYSTOSCOPY WITH STENT PLACEMENT Right 06/25/2016   Procedure: CYSTOSCOPY WITH STENT PLACEMENT;  Surgeon: Nickie Retort, MD;  Location: [facility];  Service: Urology;  Laterality: Right;   CYSTOSCOPY WITH STENT PLACEMENT Left 10/31/2017   Procedure: CYSTOSCOPY WITH STENT PLACEMENT;  Surgeon: Hollice Espy, MD;  Location: [facility];  Service: Urology;  Laterality: Left;   CYSTOSCOPY/URETEROSCOPY/HOLMIUM LASER/STENT PLACEMENT Left 07/23/2015   Procedure: CYSTOSCOPY/URETEROSCOPY/HOLMIUM LASER/STENT PLACEMENT/RETROGRADE PYELOGRAM;  Surgeon: Hollice Espy, MD;  Location: [facility];  Service: Urology;  Laterality: Left;   CYSTOSCOPY/URETEROSCOPY/HOLMIUM LASER/STENT PLACEMENT Left 11/16/2017   Procedure: CYSTOSCOPY/URETEROSCOPY/HOLMIUM LASER/STENT Exchange;  Surgeon: Hollice Espy, MD;  Location: [facility];  Service: Urology;  Laterality: Left;   ESOPHAGOGASTRODUODENOSCOPY (EGD) WITH PROPOFOL N/A 04/25/2015   Procedure: ESOPHAGOGASTRODUODENOSCOPY (EGD) WITH PROPOFOL;  Surgeon: Josefine Class,  MD;  Location: [facility] ENDOSCOPY;  Service: Endoscopy;  Laterality: N/A;   EYE SURGERY Bilateral 1964   lazy eye repair   EYE SURGERY Bilateral 2001   lazy eye repair   IR IMAGING GUIDED PORT INSERTION  12/08/2020   LITHOTRIPSY  2009   THYROIDECTOMY  2010   URETEROSCOPY  WITH HOLMIUM LASER LITHOTRIPSY Right 05/19/2015   Procedure: URETEROSCOPY WITH HOLMIUM LASER LITHOTRIPSY;  Surgeon: Hollice Espy, MD;  Location: [facility];  Service: Urology;  Laterality: Right;   URETEROSCOPY WITH HOLMIUM LASER LITHOTRIPSY Right 05/26/2015   Procedure: URETEROSCOPY WITH HOLMIUM LASER LITHOTRIPSY;  Surgeon: Hollice Espy, MD;  Location: [facility];  Service: Urology;  Laterality: Right;   URETEROSCOPY WITH HOLMIUM LASER LITHOTRIPSY Right 06/25/2016   Procedure: URETEROSCOPY WITH HOLMIUM LASER LITHOTRIPSY;  Surgeon: Nickie Retort, MD;  Location: [facility];  Service: Urology;  Laterality: Right;   Social History   Socioeconomic History   Marital status: Divorced    Spouse name: Not on file   Number of children: Not on file   Years of education: Not on file   Highest education level: Not on file  Occupational History   Not on file  Tobacco Use   Smoking status: Former    Packs/day: 1.00    Years: 50.00    Total pack years: 50.00    Types: Cigarettes    Quit date: 08/15/2021    Years since quitting: 0.3   Smokeless tobacco: Never   Tobacco comments:    Started smoking age 3  Vaping Use   Vaping Use: Former  Substance and Sexual Activity   Alcohol use: Not Currently    Comment: socially   Drug use: No   Sexual activity: Not Currently    Birth control/protection: None  Other Topics Concern   Not on file  Social History Narrative   Smoking: 1p 1-2 days since 17 years; alcohol: rare; 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     Social Determinants of Health   Financial Resource Strain: Not on file  Food Insecurity: Not on file  Transportation Needs: Unmet Transportation Needs (10/02/2021)   PRAPARE - Hydrologist (Medical): Yes    Lack of Transportation (Non-Medical): Yes  Physical Activity: Not on file  Stress: Not on file  Social Connections: Not on file      CURRENT MEDS:  Current Facility-Administered Medications (Endocrine & Metabolic):    [START ON 72/62/0355] insulin aspart (novoLOG) injection 0-15 Units   [START ON 12/27/2021] levothyroxine (SYNTHROID) tablet 175 mcg  Current Outpatient Medications (Endocrine & Metabolic):    glipiZIDE (GLUCOTROL) 5 MG tablet, Take 1 tablet (5 mg total) by mouth 2 (two) times daily before a meal.   levothyroxine (SYNTHROID) 175 MCG tablet, Saturday and sunday   levothyroxine (SYNTHROID, LEVOTHROID) 150 MCG tablet, Take 150 mcg by mouth at bedtime. Mon-friday  Current Facility-Administered Medications (Cardiovascular):    atorvastatin (LIPITOR) tablet 80 mg   [START ON 12/27/2021] losartan (COZAAR) tablet 100 mg   [START ON 12/27/2021] metoprolol succinate (TOPROL-XL) 24 hr tablet 25 mg  Current Outpatient Medications (Cardiovascular):    amLODipine (NORVASC) 5 MG tablet, Take 5 mg by mouth every morning.    atorvastatin (LIPITOR) 80 MG tablet, Take 80 mg by mouth at bedtime.   losartan (COZAAR) 100 MG tablet, Take 100 mg by mouth every morning.    metoprolol succinate (TOPROL-XL) 25 MG 24 hr tablet, Take 25 mg by  mouth every morning.   Current Facility-Administered Medications (Respiratory):    albuterol (VENTOLIN HFA) 108 (90 Base) MCG/ACT inhaler 2 puff   montelukast (SINGULAIR) tablet 10 mg  Current Outpatient Medications (Respiratory):    albuterol (VENTOLIN HFA) 108 (90 Base) MCG/ACT inhaler, Inhale 2 puffs into the lungs every 6 (six) hours as needed for wheezing or shortness of breath.   benzonatate (TESSALON PERLES) 100 MG capsule, Take 1 capsule (100 mg total) by mouth 3 (three) times daily as needed for cough.   fluticasone-salmeterol (ADVAIR HFA) 45-21 MCG/ACT inhaler, Inhale 2 puffs into the lungs 2 (two) times daily.   montelukast (SINGULAIR) 10 MG tablet, Take 1 tablet (10 mg total) by mouth at bedtime.  Current Facility-Administered Medications (Analgesics):    acetaminophen  (TYLENOL) tablet 650 mg **OR** acetaminophen (TYLENOL) suppository 650 mg  Current Outpatient Medications (Analgesics):    HYDROcodone-acetaminophen (NORCO/VICODIN) 5-325 MG tablet, Take 1 tablet by mouth every 8 (eight) hours as needed for moderate pain.  Current Facility-Administered Medications (Hematological):    heparin injection 5,000 Units  Current Outpatient Medications (Hematological):    folic acid (FOLVITE) 1 MG tablet, Take 1 tablet (1 mg total) by mouth daily.  Facility-Administered Medications Ordered in Other Encounters (Hematological):    heparin lock flush 100 UNIT/ML injection  Current Facility-Administered Medications (Other):    azithromycin (ZITHROMAX) 500 mg in sodium chloride 0.9 % 250 mL IVPB   bisacodyl (DULCOLAX) EC tablet 5 mg   docusate sodium (COLACE) capsule 100 mg   gabapentin (NEURONTIN) capsule 100 mg   lactated ringers infusion   ondansetron (ZOFRAN) tablet 8 mg   [START ON 12/27/2021] pantoprazole (PROTONIX) EC tablet 40 mg   polyethylene glycol (MIRALAX / GLYCOLAX) packet 17 g   vancomycin (VANCOREADY) IVPB 1750 mg/350 mL  Current Outpatient Medications (Other):    Accu-Chek Softclix Lancets lancets, Check glucose once a day   ARTIFICIAL TEAR OP, Apply to eye.   blood glucose meter kit and supplies KIT, Dispense based on patient and insurance preference. Use up to three times daily as directed.   buPROPion (WELLBUTRIN SR) 150 MG 12 hr tablet, Take 150 mg by mouth daily.   cholecalciferol (VITAMIN D) 1000 units tablet, Take 2,000 Units by mouth daily.   Cranberry-Vitamin C-Probiotic (AZO CRANBERRY PO), Take 1 tablet by mouth daily.   gabapentin (NEURONTIN) 100 MG capsule, Take 1 capsule (100 mg total) by mouth 2 (two) times daily.   lidocaine-prilocaine (EMLA) cream, Apply 1 application topically as needed.   loperamide (IMODIUM) 2 MG capsule, Take by mouth as needed for diarrhea or loose stools.   nystatin (MYCOSTATIN) 100000 UNIT/ML suspension,  Swish and swallow. Don't drink fluids immediately following administration.   omeprazole (PRILOSEC) 20 MG capsule, Take 20 mg by mouth daily before breakfast.    ondansetron (ZOFRAN) 8 MG tablet, Take 1 tablet (8 mg total) by mouth every 8 (eight) hours as needed for nausea or vomiting. Start on the third day after chemotherapy   prochlorperazine (COMPAZINE) 10 MG tablet, Take 1 tablet (10 mg total) by mouth every 6 (six) hours as needed for nausea or vomiting.   sucralfate (CARAFATE) 1 g tablet, Take 1 tablet (1 g total) by mouth 4 (four) times daily -  with meals and at bedtime. (Patient taking differently: Take 1 g by mouth 4 (four) times daily as needed.)    ED Course: Pt in Ed is alert, awake and oriented. Meets severe sepsis criteria.  Vitals:   12/26/21 2030 12/26/21 2100 12/26/21  2213 12/26/21 2214  BP: 114/67 123/72 125/64   Pulse: 74 71 64   Resp: _0 Temp:    97.9 F (36.6 C)  TempSrc:      SpO2: 100% 98% 97%   Weight:      Height:       No intake/output data recorded. SpO2: 97 % Blood work in ed shows: Negative flu/covid. Labs show leucocytosis and lactic acidosis.  Results for orders placed or performed during the hospital encounter of 12/26/21 (from the past 48 hour(s))  Resp Panel by RT-PCR (Flu A&B, Covid) Anterior Nasal Swab     Status: None   Collection Time: 12/26/21  6:14 PM   Specimen: Anterior Nasal Swab  Result Value Ref Range   SARS Coronavirus 2 by RT PCR NEGATIVE NEGATIVE    Comment: (NOTE) SARS-CoV-2 target nucleic acids are NOT DETECTED.  The SARS-CoV-2 RNA is generally detectable in upper respiratory specimens during the acute phase of infection. The lowest concentration of SARS-CoV-2 viral copies this assay can detect is 138 copies/mL. A negative result does not preclude SARS-Cov-2 infection and should not be used as the sole basis for treatment or other patient management decisions. A negative result may occur with  improper specimen  collection/handling, submission of specimen other than nasopharyngeal swab, presence of viral mutation(s) within the areas targeted by this assay, and inadequate number of viral copies(<138 copies/mL). A negative result must be combined with clinical observations, patient history, and epidemiological information. The expected result is Negative.  Fact Sheet for Patients:  EntrepreneurPulse.com.au  Fact Sheet for Healthcare Providers:  IncredibleEmployment.be  This test is no t yet approved or cleared by the [place] FDA and  has been authorized for detection and/or diagnosis of SARS-CoV-2 by FDA under an Emergency Use Authorization (EUA). This EUA will remain  in effect (meaning this test can be used) for the duration of the COVID-19 declaration under Section 564(b)(1) of the Act, 21 U.S.C.section 360bbb-3(b)(1), unless the authorization is terminated  or revoked sooner.       Influenza A by PCR NEGATIVE NEGATIVE   Influenza B by PCR NEGATIVE NEGATIVE    Comment: (NOTE) The Xpert Xpress SARS-CoV-2/FLU/RSV plus assay is intended as an aid in the diagnosis of influenza from Nasopharyngeal swab specimens and should not be used as a sole basis for treatment. Nasal washings and aspirates are unacceptable for Xpert Xpress SARS-CoV-2/FLU/RSV testing.  Fact Sheet for Patients: EntrepreneurPulse.com.au  Fact Sheet for Healthcare Providers: IncredibleEmployment.be  This test is not yet approved or cleared by the [place] FDA and has been authorized for detection and/or diagnosis of SARS-CoV-2 by FDA under an Emergency Use Authorization (EUA). This EUA will remain in effect (meaning this test can be used) for the duration of the COVID-19 declaration under Section 564(b)(1) of the Act, 21 U.S.C. section 360bbb-3(b)(1), unless the authorization is terminated or revoked.  Performed at [facility], [address] [place], [place] [address]   Comprehensive metabolic panel     Status: Abnormal   Collection Time: 12/26/21  8:18 PM  Result Value Ref Range   Sodium 139 135 - 145 mmol/L   Potassium 3.2 (L) 3.5 - 5.1 mmol/L   Chloride 107 98 - 111 mmol/L   CO2 23 22 - 32 mmol/L   Glucose, Bld 77 70 - 99 mg/dL    Comment: Glucose reference range applies only to samples taken after fasting for at least 8 hours.   BUN 18  8 - 23 mg/dL   Creatinine, Ser 0.71 0.44 - 1.00 mg/dL   Calcium 9.1 8.9 - 10.3 mg/dL   Total Protein 6.9 6.5 - 8.1 g/dL   Albumin 3.5 3.5 - 5.0 g/dL   AST 32 15 - 41 U/L   ALT 18 0 - 44 U/L   Alkaline Phosphatase 78 38 - 126 U/L   Total Bilirubin 1.0 0.3 - 1.2 mg/dL   GFR, Estimated >60 >60 mL/min    Comment: (NOTE) Calculated using the CKD-EPI Creatinine Equation (2021)    Anion gap 9 5 - 15    Comment: Performed at [facility], [address], [place], [place] [address]  Troponin I (High Sensitivity)     Status: None   Collection Time: 12/26/21  8:18 PM  Result Value Ref Range   Troponin I (High Sensitivity) 7 <18 ng/L    Comment: (NOTE) Elevated high sensitivity troponin I (hsTnI) values and significant  changes across serial measurements may suggest ACS but many other  chronic and acute conditions are known to elevate hsTnI results.  Refer to the "Links" section for chest pain algorithms and additional  guidance. Performed at [facility], [address] [place], [place] [address]   CBC with Differential     Status: Abnormal   Collection Time: 12/26/21  8:18 PM  Result Value Ref Range   WBC 31.4 (H) 4.0 - 10.5 K/uL   RBC 3.74 (L) 3.87 - 5.11 MIL/uL   Hemoglobin 12.0 12.0 - 15.0 g/dL   HCT 37.3 36.0 - 46.0 %   MCV 99.7 80.0 - 100.0 fL   MCH 32.1 26.0 - 34.0 pg   MCHC 32.2 30.0 - 36.0 g/dL   RDW 16.5 (H) 11.5 - 15.5 %   Platelets 197 150 - 400 K/uL   nRBC 0.0 0.0 - 0.2 %   Neutrophils Relative % 89 %   Neutro Abs 27.9  (H) 1.7 - 7.7 K/uL   Lymphocytes Relative 3 %   Lymphs Abs 1.0 0.7 - 4.0 K/uL   Monocytes Relative 0 %   Monocytes Absolute 0.0 (L) 0.1 - 1.0 K/uL   Eosinophils Relative 0 %   Eosinophils Absolute 0.1 0.0 - 0.5 K/uL   Basophils Relative 0 %   Basophils Absolute 0.0 0.0 - 0.1 K/uL   WBC Morphology MILD LEFT SHIFT (1-5% METAS, OCC MYELO, OCC BANDS)    RBC Morphology MORPHOLOGY UNREMARKABLE    Smear Review Normal platelet morphology    Immature Granulocytes 8 %   Abs Immature Granulocytes 2.46 (H) 0.00 - 0.07 K/uL    Comment: Performed at [facility], [address] [place], [place] [address]  Lactic acid, plasma     Status: Abnormal   Collection Time: 12/26/21  8:44 PM  Result Value Ref Range   Lactic Acid, Venous 3.5 (HH) 0.5 - 1.9 mmol/L    Comment: CRITICAL RESULT CALLED TO, READ BACK BY AND VERIFIED WITH Gevena Barre RN AT 2210 12/26/2021 GAA Performed at [facility], [address] [place], [place] [address]   Protime-INR     Status: None   Collection Time: 12/26/21  8:44 PM  Result Value Ref Range   Prothrombin Time 13.5 11.4 - 15.2 seconds   INR 1.0 0.8 - 1.2    Comment: (NOTE) INR goal varies based on device and disease states. Performed at [facility], [address] [place], [place] [address]   APTT     Status: None  Collection Time: 12/26/21  8:44 PM  Result Value Ref Range   aPTT 26 24 - 36 seconds    Comment: Performed at [facility], [place], [place] [address]  Troponin I (High Sensitivity)     Status: None   Collection Time: 12/26/21  8:50 PM  Result Value Ref Range   Troponin I (High Sensitivity) 6 <18 ng/L    Comment: (NOTE) Elevated high sensitivity troponin I (hsTnI) values and significant  changes across serial measurements may suggest ACS but many other  chronic and acute conditions are known to elevate hsTnI results.  Refer to the "Links" section for chest pain algorithms and additional   guidance. Performed at [facility], [address] [place], [place] [address]     In Ed pt received  Meds ordered this encounter  Medications   DISCONTD: lactated ringers infusion   sodium chloride 0.9 % bolus 1,000 mL    Order Specific Question:   Reason 30 mL/kg dose is not being ordered    Answer:   Non-Septic Shock Clinical Presentation   DISCONTD: vancomycin (VANCOCIN) IVPB 1000 mg/200 mL premix    Order Specific Question:   Indication:    Answer:   Pneumonia   ceFEPIme (MAXIPIME) 2 g in sodium chloride 0.9 % 100 mL IVPB    Order Specific Question:   Antibiotic Indication:    Answer:   HCAP   vancomycin (VANCOREADY) IVPB 1750 mg/350 mL    Order Specific Question:   Indication:    Answer:   Sepsis   iohexol (OMNIPAQUE) 350 MG/ML injection 75 mL   morphine (PF) 4 MG/ML injection 4 mg   ondansetron (ZOFRAN) injection 4 mg   lactated ringers infusion   albuterol (VENTOLIN HFA) 108 (90 Base) MCG/ACT inhaler 2 puff   atorvastatin (LIPITOR) tablet 80 mg   gabapentin (NEURONTIN) capsule 100 mg   levothyroxine (SYNTHROID) tablet 175 mcg    Saturday and sunday     metoprolol succinate (TOPROL-XL) 24 hr tablet 25 mg   losartan (COZAAR) tablet 100 mg   montelukast (SINGULAIR) tablet 10 mg   pantoprazole (PROTONIX) EC tablet 40 mg   ondansetron (ZOFRAN) tablet 8 mg    Start on the third day after chemotherapy     insulin aspart (novoLOG) injection 0-15 Units    Order Specific Question:   Correction coverage:    Answer:   Moderate (average weight, post-op)    Order Specific Question:   CBG < 70:    Answer:   implement hypoglycemia protocol    Order Specific Question:   CBG 70 - 120:    Answer:   0 units    Order Specific Question:   CBG 121 - 150:    Answer:   2 units    Order Specific Question:   CBG 151 - 200:    Answer:   3 units    Order Specific Question:   CBG 201 - 250:    Answer:   5 units    Order Specific Question:   CBG 251 - 300:    Answer:   8 units     Order Specific Question:   CBG 301 - 350:    Answer:   11 units    Order Specific Question:   CBG 351 - 400:    Answer:   15 units    Order Specific Question:   CBG > 400    Answer:   call MD and obtain  STAT lab verification   OR Linked Order Group    acetaminophen (TYLENOL) tablet 650 mg    acetaminophen (TYLENOL) suppository 650 mg   docusate sodium (COLACE) capsule 100 mg   polyethylene glycol (MIRALAX / GLYCOLAX) packet 17 g   bisacodyl (DULCOLAX) EC tablet 5 mg   heparin injection 5,000 Units   azithromycin (ZITHROMAX) 500 mg in sodium chloride 0.9 % 250 mL IVPB    Order Specific Question:   Antibiotic Indication:    Answer:   CAP    Order Specific Question:   Other Indication:    Answer:   atypical coverage.    Unresulted Labs (From admission, onward)     Start     Ordered   12/27/21 0500  Cortisol-am, blood  Tomorrow morning,   URGENT        12/26/21 2316   12/27/21 0500  Procalcitonin  Tomorrow morning,   URGENT        12/26/21 2316   12/26/21 2310  Hemoglobin A1c  Once,   URGENT       Comments: To assess prior glycemic control    12/26/21 2316   12/26/21 2310  HIV Antibody (routine testing w rflx)  (HIV Antibody (Routine testing w reflex) panel)  Once,   URGENT        12/26/21 2316   12/26/21 2044  Lactic acid, plasma  (Septic presentation on arrival (screening labs, nursing and treatment orders for obvious sepsis))  Now then every 2 hours,   STAT      12/26/21 2044   12/26/21 2044  Blood Culture (routine x 2)  (Septic presentation on arrival (screening labs, nursing and treatment orders for obvious sepsis))  BLOOD CULTURE X 2,   STAT      12/26/21 2044   12/26/21 2044  Urinalysis, Complete w Microscopic  (Septic presentation on arrival (screening labs, nursing and treatment orders for obvious sepsis))  ONCE - URGENT,   URGENT        12/26/21 2044             Admission Imaging : CT Angio Chest PE W/Cm &/Or Wo Cm  Result Date: 12/26/2021 CLINICAL DATA:   Metastatic left lower lobe non-small cell lung cancer, dizziness, cough, left posterior rib pain EXAM: CT ANGIOGRAPHY CHEST WITH CONTRAST TECHNIQUE: Multidetector CT imaging of the chest was performed using the standard protocol during bolus administration of intravenous contrast. Multiplanar CT image reconstructions and MIPs were obtained to evaluate the vascular anatomy. RADIATION DOSE REDUCTION: This exam was performed according to the departmental dose-optimization program which includes automated exposure control, adjustment of the mA and/or kV according to patient size and/or use of iterative reconstruction technique. CONTRAST:  41m OMNIPAQUE IOHEXOL 350 MG/ML SOLN COMPARISON:  11/19/2021, 12/02/2021 FINDINGS: Cardiovascular: This is a technically adequate evaluation of the pulmonary vasculature. No filling defects or pulmonary emboli. The heart is unremarkable without pericardial effusion. No evidence of thoracic aortic aneurysm or dissection. Stable atherosclerosis. 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 Right chest wall port via internal jugular approach, tip within the atriocaval junction. Mediastinum/Nodes: Status post thyroidectomy. Trachea and esophagus are unremarkable. Small hiatal hernia. No pathologic mediastinal, hilar, or axillary adenopathy. Lungs/Pleura: Left lower lobe consolidation in the posterior costophrenic angle obscures the known underlying nodule, with consolidation now measuring 2.7 x 1.6 cm image 121/5 not appreciably changed since prior exam. Other bilateral pulmonary nodules are again identified. Index nodules are as follows: Left lower  lobe, image 126/5, 1.3 x 1.0 cm. Previously 1.1 x 0.9 cm. Right upper lobe, image 35/5, 1.0 x 0.7 cm. Previously 1.1 x 0.9 cm. There are multiple new subcentimeter ground-glass nodules throughout the bilateral upper lobes, concerning for progressive metastatic disease over underlying inflammation  or infection. No effusion or pneumothorax.  Central airways are patent. Upper Abdomen: There are multiple new and enlarging hypodense lesions throughout the liver parenchyma consistent with progressive metastatic disease. Index lesion within the left lobe liver image 132/4 measures 3.7 x 3.0 cm, previously 1.4 x 1.3 cm. Musculoskeletal: There are no acute or destructive bony lesions. Reconstructed images demonstrate no additional findings. Review of the MIP images confirms the above findings. IMPRESSION: 1. No evidence of pulmonary embolus. 2. Multiple pulmonary nodules as above, with interval enlargement of the left lower lobe nodule consistent with progression of disease. 3. Progressive hepatic metastatic disease. 4. Progressive bilateral subcentimeter ground-glass nodularity, greatest in the upper lobes, which could reflect progressive metastatic disease versus nonspecific inflammatory/infectious etiology. 5. Aortic Atherosclerosis (ICD10-I70.0). 6. Hiatal hernia. Electronically Signed   By: Randa Ngo M.D.   On: 12/26/2021 21:53   DG Chest 2 View  Result Date: 12/26/2021 CLINICAL DATA:  c/o fall 3-4 days ago, low back and rib pain since that time. Also endorses nausea since chemo treatments changed several days ago. EXAM: CHEST - 2 VIEW COMPARISON:  03/26/2019 FINDINGS: Interval placement of right IJ power port to the distal SVC. No pneumothorax. Patchy airspace infiltrate in the posterior left lower lobe, new since previous. Right lung clear. Heart size and mediastinal contours are within normal limits. No effusion. Visualized bones unremarkable.  No pneumothorax. IMPRESSION: Patchy left lower lobe infiltrate, new since previous. Electronically Signed   By: Lucrezia Europe M.D.   On: 12/26/2021 19:15      Physical Examination: Vitals:   12/26/21 2030 12/26/21 2100 12/26/21 2213 12/26/21 2214  BP: 114/67 123/72 125/64   Pulse: 74 71 64   Temp:    97.9 F (36.6 C)  Resp: _0 Height:       Weight:      SpO2: 100% 98% 97%   TempSrc:      BMI (Calculated):       Physical Exam Vitals and nursing note reviewed.  Constitutional:      General: She is not in acute distress.    Appearance: She is not ill-appearing, toxic-appearing or diaphoretic.  HENT:     Head: Normocephalic and atraumatic.     Right Ear: Hearing and external ear normal.     Left Ear: Hearing and external ear normal.     Nose: Nose normal. No nasal deformity.     Mouth/Throat:     Lips: Pink.     Mouth: Mucous membranes are moist.     Tongue: No lesions.  Eyes:     Extraocular Movements: Extraocular movements intact.     Pupils: Pupils are equal, round, and reactive to light.  Cardiovascular:     Rate and Rhythm: Regular rhythm. Tachycardia present.     Pulses: Normal pulses.     Heart sounds: Normal heart sounds.  Pulmonary:     Effort: Pulmonary effort is normal.     Breath sounds: Wheezing present.  Abdominal:     General: Bowel sounds are normal. There is no distension.     Palpations: Abdomen is soft. There is no mass.     Tenderness: There is no abdominal tenderness. There is no guarding.  Hernia: No hernia is present.  Musculoskeletal:     Right lower leg: No edema.     Left lower leg: No edema.  Skin:    General: Skin is warm.  Neurological:     General: No focal deficit present.     Mental Status: She is alert and oriented to person, place, and time.     Cranial Nerves: Cranial nerves 2-12 are intact.     Motor: Motor function is intact.  Psychiatric:        Attention and Perception: Attention normal.        Mood and Affect: Mood normal.        Speech: Speech normal.        Behavior: Behavior normal. Behavior is cooperative.        Cognition and Memory: Cognition normal.     Assessment and Plan: Sepsis due to pneumonia ([place]) Suspect postobstructive PNA. Cont with IV abx nad supportive care with Nebs and MDI and antitussive and CPT.  Tobacco use Nicotine patch.   Adult  hypothyroidism Continue patient on levothyroxine dose verification needs to be done by med rec so the medication can be restarted by either pharmacy or a.m. team.  Gastro-esophageal reflux disease without esophagitis IV PPI.   Essential (primary) hypertension Vitals:   12/26/21 1814 12/26/21 2030 12/26/21 2100 12/26/21 2213  BP: (!) 142/72 114/67 123/72 125/64  Continue patient on metoprolol 25 mg daily,  Cozaar 100 mg daily. Lab Results  Component Value Date   CREATININE 0.71 12/26/2021   CREATININE 0.86 12/22/2021   CREATININE 0.88 12/16/2021       DVT prophylaxis:  Heparin   Code Status:  Full    Family Communication:  Harvie Bridge (Daughter) [phone]    Disposition Plan:  Home    Consults called:  None   Admission status: Inpatient    Unit/ Expected LOS: 2-3 days.    Para Skeans MD [facility]  6 PM- 2 AM. Please contact me via secure Chat 6 PM-2 AM. [phone] ( Pager ) To contact the [facility] Attending or Consulting provider [place] or covering provider during after hours [place], for this patient.   Check the care team in [facility] and look for a) attending/consulting [facility] provider listed and b) the [facility] team listed Log into [email or web address] and use [place]'s universal password to access. If you do not have the password, please contact the hospital operator. Locate the [facility] provider you are looking for under [facility] and page to a number that you can be directly reached. If you still have difficulty reaching the provider, please page the [facility] (Director on Call) for the Hospitalists listed on amion for assistance. [email or web address] 12/26/2021, 11:16 PM

## 2021-12-27 ENCOUNTER — Other Ambulatory Visit: Payer: Self-pay | Admitting: Oncology

## 2021-12-27 DIAGNOSIS — C801 Malignant (primary) neoplasm, unspecified: Secondary | ICD-10-CM

## 2021-12-27 DIAGNOSIS — N390 Urinary tract infection, site not specified: Secondary | ICD-10-CM

## 2021-12-27 DIAGNOSIS — J189 Pneumonia, unspecified organism: Principal | ICD-10-CM

## 2021-12-27 DIAGNOSIS — A419 Sepsis, unspecified organism: Secondary | ICD-10-CM

## 2021-12-27 LAB — URINALYSIS, COMPLETE (UACMP) WITH MICROSCOPIC
Bilirubin Urine: NEGATIVE
Glucose, UA: NEGATIVE mg/dL
Ketones, ur: NEGATIVE mg/dL
Nitrite: POSITIVE — AB
Protein, ur: NEGATIVE mg/dL
Specific Gravity, Urine: 1.018 (ref 1.005–1.030)
pH: 8 (ref 5.0–8.0)

## 2021-12-27 LAB — CBC WITH DIFFERENTIAL/PLATELET
Abs Immature Granulocytes: 2.96 10*3/uL — ABNORMAL HIGH (ref 0.00–0.07)
Basophils Absolute: 0 10*3/uL (ref 0.0–0.1)
Basophils Relative: 0 %
Eosinophils Absolute: 0.1 10*3/uL (ref 0.0–0.5)
Eosinophils Relative: 0 %
HCT: 34.9 % — ABNORMAL LOW (ref 36.0–46.0)
Hemoglobin: 11.2 g/dL — ABNORMAL LOW (ref 12.0–15.0)
Immature Granulocytes: 10 %
Lymphocytes Relative: 3 %
Lymphs Abs: 0.8 10*3/uL (ref 0.7–4.0)
MCH: 32.2 pg (ref 26.0–34.0)
MCHC: 32.1 g/dL (ref 30.0–36.0)
MCV: 100.3 fL — ABNORMAL HIGH (ref 80.0–100.0)
Monocytes Absolute: 0.1 10*3/uL (ref 0.1–1.0)
Monocytes Relative: 0 %
Neutro Abs: 25.2 10*3/uL — ABNORMAL HIGH (ref 1.7–7.7)
Neutrophils Relative %: 87 %
Platelets: 180 10*3/uL (ref 150–400)
RBC: 3.48 MIL/uL — ABNORMAL LOW (ref 3.87–5.11)
RDW: 16.6 % — ABNORMAL HIGH (ref 11.5–15.5)
Smear Review: NORMAL
WBC: 29.1 10*3/uL — ABNORMAL HIGH (ref 4.0–10.5)
nRBC: 0 % (ref 0.0–0.2)

## 2021-12-27 LAB — COMPREHENSIVE METABOLIC PANEL
ALT: 16 U/L (ref 0–44)
AST: 26 U/L (ref 15–41)
Albumin: 3.2 g/dL — ABNORMAL LOW (ref 3.5–5.0)
Alkaline Phosphatase: 76 U/L (ref 38–126)
Anion gap: 6 (ref 5–15)
BUN: 16 mg/dL (ref 8–23)
CO2: 25 mmol/L (ref 22–32)
Calcium: 8.6 mg/dL — ABNORMAL LOW (ref 8.9–10.3)
Chloride: 111 mmol/L (ref 98–111)
Creatinine, Ser: 0.66 mg/dL (ref 0.44–1.00)
GFR, Estimated: >60 mL/min (ref >60)
Glucose, Bld: 93 mg/dL (ref 70–99)
Potassium: 3.3 mmol/L — ABNORMAL LOW (ref 3.5–5.1)
Sodium: 142 mmol/L (ref 135–145)
Total Bilirubin: 0.8 mg/dL (ref 0.3–1.2)
Total Protein: 6.3 g/dL — ABNORMAL LOW (ref 6.5–8.1)

## 2021-12-27 LAB — GLUCOSE, CAPILLARY
Glucose-Capillary: 112 mg/dL — ABNORMAL HIGH (ref 70–99)
Glucose-Capillary: 115 mg/dL — ABNORMAL HIGH (ref 70–99)
Glucose-Capillary: 111 mg/dL — ABNORMAL HIGH (ref 70–99)
Glucose-Capillary: 145 mg/dL — ABNORMAL HIGH (ref 70–99)

## 2021-12-27 LAB — LACTIC ACID, PLASMA: Lactic Acid, Venous: 1.3 mmol/L (ref 0.5–1.9)

## 2021-12-27 LAB — HEMOGLOBIN A1C
Hgb A1c MFr Bld: 5.4 % (ref 4.8–5.6)
Mean Plasma Glucose: 108.28 mg/dL

## 2021-12-27 LAB — MRSA NEXT GEN BY PCR, NASAL: MRSA by PCR Next Gen: NOT DETECTED

## 2021-12-27 LAB — CORTISOL-AM, BLOOD: Cortisol - AM: 13.8 ug/dL (ref 6.7–22.6)

## 2021-12-27 LAB — PROCALCITONIN: Procalcitonin: 36.28 ng/mL

## 2021-12-27 LAB — PHOSPHORUS: Phosphorus: 3.6 mg/dL (ref 2.5–4.6)

## 2021-12-27 LAB — MAGNESIUM: Magnesium: 1.7 mg/dL (ref 1.7–2.4)

## 2021-12-27 MED ORDER — LACTATED RINGERS IV SOLN: INTRAVENOUS | Status: AC

## 2021-12-27 MED ORDER — PHENOL 1.4 % MT LIQD
1.0000 | OROMUCOSAL | Status: DC | PRN
  Administered 2021-12-27: 1 via OROMUCOSAL
  Filled 2021-12-27: qty 177

## 2021-12-27 MED ORDER — ALUM & MAG HYDROXIDE-SIMETH 200-200-20 MG/5ML PO SUSP
30.0000 mL | Freq: Four times a day (QID) | ORAL | Status: DC | PRN
  Administered 2021-12-27: 30 mL via ORAL
  Filled 2021-12-27: qty 30

## 2021-12-27 MED ORDER — ENOXAPARIN SODIUM 40 MG/0.4ML IJ SOSY
40.0000 mg | PREFILLED_SYRINGE | INTRAMUSCULAR | Status: DC
  Administered 2021-12-27 – 2021-12-29 (×3): 40 mg via SUBCUTANEOUS
  Filled 2021-12-27 (×4): qty 0.4

## 2021-12-27 MED ORDER — CHLORHEXIDINE GLUCONATE CLOTH 2 % EX PADS
6.0000 | MEDICATED_PAD | Freq: Every day | CUTANEOUS | Status: DC
  Administered 2021-12-27 – 2021-12-29 (×3): 6 via TOPICAL

## 2021-12-27 NOTE — Assessment & Plan Note (Signed)
Suspect either from chemotherapy side effect or new infection. Leucocytosis and procal both elevated.  We will follow for any clinical improvement.

## 2021-12-27 NOTE — Evaluation (Signed)
Occupational Therapy Evaluation Patient Details Name: Felicia Manning MRN: [id number] DOB: [birth year] Today's Date: 12/27/2021   History of Present Illness Pt is a 66 y/o F admitted on 12/26/21 after presenting with c/o fever, chills, productive cough & L sided rib pain worsening x 7 days. Pt is being treated for severe sepsis & complicated UTI. 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 Pt presenting with decreased independence in self care, balance, functional mobility/transfers, and endurance. At baseline, pt is independent with ADLs, receives assistance PRN for IADLs, and Mod I for functional mobility using a RW. Pt currently functioning at set up-supervision for most ADL tasks including seated LB dressing, seated grooming, clothing management and peri care in standing. Pt also completed functional mobility to the bathroom and toilet transfer with supervision using RW. Pt educated in energy conservation strategies including activity pacing, home/routines modifications, AE/DME, prioritizing of meaningful occupations, and falls prevention. Pt will benefit from acute OT to increase overall independence in the areas of ADLs and functional mobility in order to safely discharge home. Pt could benefit from [facility] following D/C to decrease falls risk, improve balance, and maximize independence in self-care within own home environment.     Recommendations for follow up therapy are one component of a multi-disciplinary discharge planning process, led by the attending physician.  Recommendations may be updated based on patient status, additional functional criteria and insurance authorization.   Follow Up Recommendations  Home health OT    Assistance Recommended at Discharge Intermittent Supervision/Assistance   Patient can return home with the following A little help with walking and/or transfers;A little help with bathing/dressing/bathroom;Assistance with cooking/housework;Assist for transportation    Functional Status Assessment  Patient has had a recent decline in their functional status and demonstrates the ability to make significant improvements in function in a reasonable and predictable amount of time.  Equipment Recommendations  None recommended by OT    Recommendations for Other Services       Precautions / Restrictions Precautions Precautions: Fall Restrictions Weight Bearing Restrictions: No      Mobility Bed Mobility               General bed mobility comments: NT, pt received/left sitting in recliner    Transfers Overall transfer level: Needs assistance Equipment used: Rolling walker (2 wheels) Transfers: Sit to/from Stand Sit to Stand: Supervision           General transfer comment: STS from recliner and regular height toilet      Balance Overall balance assessment: Needs assistance Sitting-balance support: No upper extremity supported, Feet supported Sitting balance-Leahy Scale: Good     Standing balance support: Bilateral upper extremity supported, During functional activity Standing balance-Leahy Scale: Good                             ADL either performed or assessed with clinical judgement   ADL Overall ADL's : Needs assistance/impaired     Grooming: Wash/dry face;Sitting;Brushing hair;Set up               Lower Body Dressing: Supervision/safety;Sitting/lateral leans   Toilet Transfer: Ambulation;Rolling walker (2 wheels);Regular Toilet;Supervision/safety   Toileting- Clothing Manipulation and Hygiene: Supervision/safety;Sit to/from stand Toileting - Clothing Manipulation Details (indicate cue type and reason): for peri care  and clothing management in standing after voiding in toilet     Functional mobility during  ADLs: Supervision/safety;Rolling walker (2 wheels) (to go to the bathroom)       Vision Baseline Vision/History: 1 Wears glasses (reading only) Patient Visual Report: No change from baseline Additional Comments: pt reports recent cataract surgery for R eye     Perception     Praxis      Pertinent Vitals/Pain Pain Assessment Pain Assessment: Faces Faces Pain Scale: Hurts little more Pain Location: posterior side of RLE Pain Descriptors / Indicators: Discomfort Pain Intervention(s): Monitored during session     Hand Dominance     Extremity/Trunk Assessment Upper Extremity Assessment Upper Extremity Assessment: Overall WFL for tasks assessed   Lower Extremity Assessment Lower Extremity Assessment: Generalized weakness       Communication Communication Communication: No difficulties   Cognition Arousal/Alertness: Awake/alert Behavior During Therapy: WFL for tasks assessed/performed Overall Cognitive Status: Within Functional Limits for tasks assessed                                       General Comments  Pt endorsed numbness/tingling in B hands and feet 2/2 chemo    Exercises Other Exercises Other Exercises: OT provided education re: role of OT, OT POC, post acute recs, sitting up for all meals, EOB/OOB mobility with assistance, home/fall safety, energy conservation techniques   Shoulder Instructions      Home Living Family/patient expects to be discharged to:: Private residence Living Arrangements: Alone Available Help at Discharge: Family;Available PRN/intermittently Type of Home: House Home Access: Level entry     Home Layout: Two level;Able to live on main level with bedroom/bathroom (sleeps downstairs, 1/2 bath downstairs, shower upstairs)     Bathroom Shower/Tub: Teacher, early years/pre: Handicapped height     Home Equipment: Rollator (4 wheels);Shower seat;Tub bench   Additional Comments: Daughter lives nearby and has  started to assist pt as she is able (she has 3 kids of her own).      Prior Functioning/Environment Prior Level of Function : Independent/Modified Independent;Driving;History of Falls (last six months)             Mobility Comments: Pt reports she was mod I with cane until a few weeks ago when she started using rollator 2/2 posterior RLE pain. Pt endorses 1 fall prior to admission 2/2 starting gabapentin & medications making her dizzy. ADLs Comments: Independent with ADLs. Pt completes her own laundry, dishes, cooking. Pt has housekeeper 1x/month, medical transport to chemo appointments, orders groceries online and drives to store for pick up.        OT Problem List: Decreased strength;Decreased activity tolerance;Impaired balance (sitting and/or standing);Decreased knowledge of use of DME or AE      OT Treatment/Interventions: Energy conservation;DME and/or AE instruction;Balance training;Patient/family education;Self-care/ADL training;Therapeutic exercise;Therapeutic activities    OT Goals(Current goals can be found in the care plan section) Acute Rehab OT Goals Patient Stated Goal: to go home OT Goal Formulation: With patient Time For Goal Achievement: 01/10/22 Potential to Achieve Goals: Good   OT Frequency: Min 2X/week    Co-evaluation              AM-PAC OT "6 Clicks" Daily Activity     Outcome Measure Help from another person eating meals?: None Help from another person taking care of personal grooming?: A Little Help from another person toileting,  which includes using toliet, bedpan, or urinal?: A Little Help from another person bathing (including washing, rinsing, drying)?: A Little Help from another person to put on and taking off regular upper body clothing?: None Help from another person to put on and taking off regular lower body clothing?: A Little 6 Click Score: 20   End of Session Equipment Utilized During Treatment: Gait belt;Rolling walker (2  wheels) Nurse Communication: Mobility status  Activity Tolerance: Patient tolerated treatment well Patient left: in chair;with call bell/phone within reach;with chair alarm set  OT Visit Diagnosis: Muscle weakness (generalized) (M62.81);Pain;Unsteadiness on feet (R26.81) Pain - Right/Left: Right Pain - part of body: Leg                Time: 1544-1601 OT Time Calculation (min): 17 min Charges:  OT General Charges $OT Visit: 1 Visit OT Evaluation $OT Eval Moderate Complexity: 1 Mod  [facility] MS, OTR/L ascom [phone]  12/27/21, 6:18 PM

## 2021-12-27 NOTE — Progress Notes (Signed)
PROGRESS NOTE  Felicia Manning    DOB: [birth year], 66 y.o.  KMM:[id number]    Code Status: Full Code   DOA: 12/26/2021   LOS: 1   Brief hospital course  Felicia Manning is a 66 y.o. 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 Rogue Bussing, and Dr. Grayland Ormond, oncology. Also has h/o thyroid cancer, T2DM, HLD, HTN, hepatitis C, hypothyroidism, GERD, Barrett's esophagus, osteoporosis, has dentures.   They presented from home to the ED on 12/26/2021 with fever/chills, productive cough, and left-sided rib pain worsening x 7 days. Ambulates with walker at baseline. She is currently undergoing chemotherapy and recently changed therapies, per patient. She states that she recently received a WBC stimulating treatment (udenyca).  In the ED, it was found that they had stable vital signs on room air.  Significant findings included negative covid screening, K+ 3.2 but otherwise normal metabolic panel, negative troponin, WBC 31.4 with neutrophil predominance, hgb 12.0, LA 3.5 . Urinalysis positive for nitrites, positive leukocytes and small hgb. Blood cultures collected.  CTA negative for acute PE. Imaging showed known nodules in lungs and liver with interval enlargement in left lower lobe from previous. No obvious infection.  They were initially treated with cefepime, vancomycin, LR.   Patient was admitted to medicine service for further workup and management of severe sepsis as outlined in detail below.  12/27/21 -stable ORA, no complaints.  Assessment & Plan  Principal Problem:   Fever Active Problems:   Sepsis due to pneumonia ([place])   Essential (primary) hypertension   Gastro-esophageal reflux disease without esophagitis   Adult hypothyroidism   Tobacco use  Severe Sepsis- met sepsis criteria on admission with elevated LA, leukocytosis, and HR of 92 on presentation. suspected source of  infection of urine. She endorses dark urine and urgency but denies dysuria. Difficult to assess infection in lungs given cancer lesions but denies respiratory symptoms currently. LA trended down to normal overnight and heart rate normalized so no longer meeting SIRS criteria. WBC count unreliable surrogate for infection given her recent treatments. She is immunocompromised. Received udenyca 11/10? Complicated UTI - urine Cx ordered - follow blood cultures - continue Abx coverage for urine and lung primary  - currently on cefepime, azithromycin, vancomycin from admission - IV fluid support - antipyretics PRN - follow fever curve - follow MRSA screen and dc vancomycin if negative - FYI to oncologist - PT/OT for physical deconditioning in setting of acute illness  HTN- well controlled - continue home meds  Hypothyroidism- TSH on 11/7 elevated to 49.4 with normal T4. Takes 136mg weekdays and 1757m weekend days - continue home meds at 17568mdaily  Tobacco use - nicotine replacement PRN  GERD - PPI  Body mass index is 27.24 kg/m.  VTE ppx: enoxaparin (LOVENOX) injection 40 mg Start: 12/27/21 0845   Diet:     Diet   Diet heart healthy/carb modified Room service appropriate? Yes; Fluid consistency: Thin   Consultants: None   Subjective 12/27/21    Pt reports feeling improved today. Denies SOB, dysuria. Endorses strong odor and darkness of her urine. Denies any active bleeding. She was supposed  to go out of town on Tuesday but is ok with staying if still in the hospital.    Objective   Vitals:   12/27/21 0000 12/27/21 0119 12/27/21 0607 12/27/21 0741  BP: 126/67 113/68 (!) 107/54 95/60  Pulse: 64 70 73 73  Resp:  _0 Temp: 98 F (36.7 C) 97.7 F (36.5 C) 99.1 F (37.3 C) 98.7 F (37.1 C)  TempSrc: Oral Oral    SpO2: 96% 95% 97% 95%  Weight:  78.9 kg    Height:  _1  (1.702 m)      Intake/Output Summary (Last 24 hours) at 12/27/2021 1120 Last data  filed at 12/27/2021 1036 Gross per 24 hour  Intake 490 ml  Output 900 ml  Net -410 ml   Filed Weights   12/26/21 1814 12/27/21 0119  Weight: 78.9 kg 78.9 kg     Physical Exam:  General: awake, alert, NAD HEENT: atraumatic, clear conjunctiva, anicteric sclera, MMM, hearing grossly normal Respiratory: normal respiratory effort. Cardiovascular: quick capillary refill, normal S1/S2, RRR, no JVD, murmurs Nervous: A&O x3. no gross focal neurologic deficits, normal speech Extremities: moves all equally, no edema, normal tone Skin: dry, intact, normal temperature, normal color. No rashes, lesions or ulcers on exposed skin Psychiatry: normal mood, congruent affect  Labs   I have personally reviewed the following labs and imaging studies CBC    Component Value Date/Time   WBC 29.1 (H) 12/27/2021 0114   RBC 3.48 (L) 12/27/2021 0114   HGB 11.2 (L) 12/27/2021 0114   HCT 34.9 (L) 12/27/2021 0114   PLT 180 12/27/2021 0114   MCV 100.3 (H) 12/27/2021 0114   MCH 32.2 12/27/2021 0114   MCHC 32.1 12/27/2021 0114   RDW 16.6 (H) 12/27/2021 0114   LYMPHSABS 0.8 12/27/2021 0114   MONOABS 0.1 12/27/2021 0114   EOSABS 0.1 12/27/2021 0114   BASOSABS 0.0 12/27/2021 0114      Latest Ref Rng & Units 12/27/2021    1:14 AM 12/26/2021    8:18 PM 12/22/2021    9:39 AM  BMP  Glucose 70 - 99 mg/dL 93  77  148   BUN 8 - 23 mg/dL _2 Creatinine 0.44 - 1.00 mg/dL 0.66  0.71  0.86   Sodium 135 - 145 mmol/L 142  139  139   Potassium 3.5 - 5.1 mmol/L 3.3  3.2  3.4   Chloride 98 - 111 mmol/L 111  107  103   CO2 22 - 32 mmol/L _3 Calcium 8.9 - 10.3 mg/dL 8.6  9.1  9.0     CT Angio Chest PE W/Cm &/Or Wo Cm  Result Date: 12/26/2021 CLINICAL DATA:  Metastatic left lower lobe non-small cell lung cancer, dizziness, cough, left posterior rib pain EXAM: CT ANGIOGRAPHY CHEST WITH CONTRAST TECHNIQUE: Multidetector CT imaging of the chest was performed using the standard protocol during bolus  administration of intravenous contrast. Multiplanar CT image reconstructions and MIPs were obtained to evaluate the vascular anatomy. RADIATION DOSE REDUCTION: This exam was performed according to the departmental dose-optimization program which includes automated exposure control, adjustment of the mA and/or kV according to patient size and/or use of iterative reconstruction technique. CONTRAST:  85m OMNIPAQUE IOHEXOL 350 MG/ML SOLN COMPARISON:  11/19/2021, 12/02/2021 FINDINGS: Cardiovascular: This is a technically adequate evaluation of the pulmonary vasculature. No filling defects or pulmonary emboli. The heart is unremarkable without pericardial effusion. No evidence of thoracic aortic aneurysm or dissection. Stable atherosclerosis. 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 Right chest wall port via internal jugular approach, tip within the atriocaval junction. Mediastinum/Nodes: Status post thyroidectomy. Trachea and esophagus are unremarkable. Small hiatal hernia. No pathologic mediastinal, hilar, or  axillary adenopathy. Lungs/Pleura: Left lower lobe consolidation in the posterior costophrenic angle obscures the known underlying nodule, with consolidation now measuring 2.7 x 1.6 cm image 121/5 not appreciably changed since prior exam. Other bilateral pulmonary nodules are again identified. Index nodules are as follows: Left lower lobe, image 126/5, 1.3 x 1.0 cm. Previously 1.1 x 0.9 cm. Right upper lobe, image 35/5, 1.0 x 0.7 cm. Previously 1.1 x 0.9 cm. There are multiple new subcentimeter ground-glass nodules throughout the bilateral upper lobes, concerning for progressive metastatic disease over underlying inflammation or infection. No effusion or pneumothorax.  Central airways are patent. Upper Abdomen: There are multiple new and enlarging hypodense lesions throughout the liver parenchyma consistent with progressive metastatic disease. Index lesion within  the left lobe liver image 132/4 measures 3.7 x 3.0 cm, previously 1.4 x 1.3 cm. Musculoskeletal: There are no acute or destructive bony lesions. Reconstructed images demonstrate no additional findings. Review of the MIP images confirms the above findings. IMPRESSION: 1. No evidence of pulmonary embolus. 2. Multiple pulmonary nodules as above, with interval enlargement of the left lower lobe nodule consistent with progression of disease. 3. Progressive hepatic metastatic disease. 4. Progressive bilateral subcentimeter ground-glass nodularity, greatest in the upper lobes, which could reflect progressive metastatic disease versus nonspecific inflammatory/infectious etiology. 5. Aortic Atherosclerosis (ICD10-I70.0). 6. Hiatal hernia. Electronically Signed   By: Randa Ngo M.D.   On: 12/26/2021 21:53   DG Chest 2 View  Result Date: 12/26/2021 CLINICAL DATA:  c/o fall 3-4 days ago, low back and rib pain since that time. Also endorses nausea since chemo treatments changed several days ago. EXAM: CHEST - 2 VIEW COMPARISON:  03/26/2019 FINDINGS: Interval placement of right IJ power port to the distal SVC. No pneumothorax. Patchy airspace infiltrate in the posterior left lower lobe, new since previous. Right lung clear. Heart size and mediastinal contours are within normal limits. No effusion. Visualized bones unremarkable.  No pneumothorax. IMPRESSION: Patchy left lower lobe infiltrate, new since previous. Electronically Signed   By: Lucrezia Europe M.D.   On: 12/26/2021 19:15    Disposition Plan & Communication  Patient status: Inpatient  Admitted From: Home Planned disposition location: Home Anticipated discharge date: 11/13 pending cultures results and transition to PO Abx  Family Communication: daughter at bedside    Author: Richarda Osmond, DO [facility] 12/27/2021, 11:20 AM   Available by Epic secure chat 7AM-7PM. If 7PM-7AM, please contact night-coverage.  [facility] contact information found  on [email or web address].

## 2021-12-27 NOTE — Evaluation (Signed)
Physical Therapy Evaluation Patient Details Name: Felicia Manning MRN: [id number] DOB: August 04, 1955 Today's Date: 12/27/2021  History of Present Illness  Pt is a 66 y/o F admitted on 12/26/21 after presenting with c/o fever, chills, productive cough & L sided rib pain worsening x 7 days. Pt is being treated for severe sepsis & complicated UTI. 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 Pt reports prior to admission she was ambulatory with cane until a few weeks ago when she began using rollator 2/2 posterior RLE pain. Pt reports she has seen outpatient ortho who reports pain is not ortho related & is in process of scheduling outpatient appointment with neurosurgery to f/u with them. PT notified MD in case MD wants to make inpatient neurosurgery referral.   Pt reports she has been experiencing dizziness since she began taking gabapentin for RLE pain, which caused her to fall 2 days prior to admission. Pt reports her gabapentin dosage has been lowered but pt still c/o dizziness upon initial STS but states symptoms resolve after ~1 minute. Pt is able to ambulate around room & 1 lap around nurses station with RW & supervision. Pt does demonstrate need for extra time for word finding occasionally during session. Pt is mobilizing well & would benefit from HHPT f/u services and intermittent assistance from her daughter. Will continue to follow pt acutely to address balance, activity tolerance, and gait with LRAD.    Recommendations for follow up therapy are one component of a multi-disciplinary discharge planning process, led by the attending physician.  Recommendations may be updated based on patient status, additional functional criteria and insurance authorization.  Follow Up Recommendations Home  health PT      Assistance Recommended at Discharge Intermittent Supervision/Assistance  Patient can return home with the following  A little help with walking and/or transfers;A little help with bathing/dressing/bathroom;Assistance with cooking/housework;Assist for transportation    Equipment Recommendations None recommended by PT  Recommendations for Other Services       Functional Status Assessment Patient has had a recent decline in their functional status and demonstrates the ability to make significant improvements in function in a reasonable and predictable amount of time.     Precautions / Restrictions Precautions Precautions: Fall Restrictions Weight Bearing Restrictions: No      Mobility  Bed Mobility Overal bed mobility: Modified Independent             General bed mobility comments: sidelying>sitting EOB with HOB slightly elevated, use of bed rails PRN    Transfers Overall transfer level: Needs assistance Equipment used: Rolling walker (2 wheels) Transfers: Sit to/from Stand Sit to Stand: Supervision           General transfer comment: decreased awareness & ability to follow instructions re: safe hand placement during STS & pt transfers STS with BUE on RW    Ambulation/Gait Ambulation/Gait assistance: Supervision Gait Distance (Feet):  (10 ft + 170 ft) Assistive device: Rolling walker (2 wheels) Gait Pattern/deviations: WFL(Within Functional Limits) Gait velocity: slightly decreased        Stairs            Wheelchair Mobility    Modified Rankin (Stroke Patients Only)       Balance Overall balance assessment: Needs assistance Sitting-balance support: No upper extremity supported, Feet supported Sitting  balance-Leahy Scale: Good     Standing balance support: Bilateral upper extremity supported, During functional activity Standing balance-Leahy Scale: Good                               Pertinent Vitals/Pain Pain  Assessment Pain Assessment: Faces Faces Pain Scale: Hurts little more Pain Location: posterior side of RLE Pain Descriptors / Indicators: Discomfort Pain Intervention(s): Monitored during session    Home Living Family/patient expects to be discharged to:: Private residence Living Arrangements: Alone Available Help at Discharge: Family;Available PRN/intermittently Type of Home: House Home Access: Level entry       Home Layout: Two level;Able to live on main level with bedroom/bathroom Home Equipment: Rollator (4 wheels)      Prior Function               Mobility Comments: Pt reports she was mod I with cane until a few weeks ago when she started using rollator 2/2 posterior RLE pain. Pt endorses 1 fall prior to admission 2/2 starting gabapentin & medications making her dizzy. Daughter has started to assist pt as she is able (she has 3 kids of her own). ADLs Comments: Pt has housekeeper once/month.     Hand Dominance        Extremity/Trunk Assessment   Upper Extremity Assessment Upper Extremity Assessment: Overall WFL for tasks assessed    Lower Extremity Assessment Lower Extremity Assessment: Generalized weakness (pt c/o pain down posterior side of RLE (reports this is pre-existing prior to admission))       Communication   Communication: No difficulties  Cognition Arousal/Alertness: Awake/alert Behavior During Therapy: WFL for tasks assessed/performed Overall Cognitive Status: Within Functional Limits for tasks assessed                                 General Comments: Pt did require extra time at moments for word finding.        General Comments      Exercises     Assessment/Plan    PT Assessment Patient needs continued PT services  PT Problem List Decreased strength;Decreased activity tolerance;Decreased balance;Decreased knowledge of use of DME;Decreased mobility       PT Treatment Interventions DME instruction;Therapeutic  exercise;Gait training;Balance training;Stair training;Functional mobility training;Therapeutic activities;Patient/family education;Neuromuscular re-education    PT Goals (Current goals can be found in the Care Plan section)  Acute Rehab PT Goals Patient Stated Goal: get better PT Goal Formulation: With patient Time For Goal Achievement: 01/10/22 Potential to Achieve Goals: Good    Frequency Min 2X/week     Co-evaluation               AM-PAC PT "6 Clicks" Mobility  Outcome Measure Help needed turning from your back to your side while in a flat bed without using bedrails?: None Help needed moving from lying on your back to sitting on the side of a flat bed without using bedrails?: None Help needed moving to and from a bed to a chair (including a wheelchair)?: A Little Help needed standing up from a chair using your arms (e.g., wheelchair or bedside chair)?: A Little Help needed to walk in hospital room?: A Little Help needed climbing 3-5 steps with a railing? : A Little 6 Click Score: 20    End of Session   Activity Tolerance: Patient tolerated treatment well Patient left: in chair;with chair  alarm set;with call bell/phone within reach Nurse Communication: Mobility status PT Visit Diagnosis: Unsteadiness on feet (R26.81);Muscle weakness (generalized) (M62.81)    Time: 7014-1030 PT Time Calculation (min) (ACUTE ONLY): 15 min   Charges:   PT Evaluation $PT Eval Moderate Complexity: Kila, PT, DPT 12/27/21, 4:01 PM   Waunita Schooner 12/27/2021, 3:59 PM

## 2021-12-28 DIAGNOSIS — C3490 Malignant neoplasm of unspecified part of unspecified bronchus or lung: Secondary | ICD-10-CM | POA: Diagnosis not present

## 2021-12-28 DIAGNOSIS — J189 Pneumonia, unspecified organism: Secondary | ICD-10-CM | POA: Diagnosis not present

## 2021-12-28 DIAGNOSIS — Z9189 Other specified personal risk factors, not elsewhere classified: Secondary | ICD-10-CM

## 2021-12-28 DIAGNOSIS — C801 Malignant (primary) neoplasm, unspecified: Secondary | ICD-10-CM | POA: Diagnosis not present

## 2021-12-28 DIAGNOSIS — N390 Urinary tract infection, site not specified: Secondary | ICD-10-CM | POA: Diagnosis not present

## 2021-12-28 DIAGNOSIS — A419 Sepsis, unspecified organism: Secondary | ICD-10-CM | POA: Diagnosis not present

## 2021-12-28 LAB — GLUCOSE, CAPILLARY
Glucose-Capillary: 112 mg/dL — ABNORMAL HIGH (ref 70–99)
Glucose-Capillary: 139 mg/dL — ABNORMAL HIGH (ref 70–99)
Glucose-Capillary: 114 mg/dL — ABNORMAL HIGH (ref 70–99)

## 2021-12-28 LAB — CBC
HCT: 29.4 % — ABNORMAL LOW (ref 36.0–46.0)
Hemoglobin: 9.5 g/dL — ABNORMAL LOW (ref 12.0–15.0)
MCH: 32.2 pg (ref 26.0–34.0)
MCHC: 32.3 g/dL (ref 30.0–36.0)
MCV: 99.7 fL (ref 80.0–100.0)
Platelets: 136 10*3/uL — ABNORMAL LOW (ref 150–400)
RBC: 2.95 MIL/uL — ABNORMAL LOW (ref 3.87–5.11)
RDW: 16.3 % — ABNORMAL HIGH (ref 11.5–15.5)
WBC: 18.8 10*3/uL — ABNORMAL HIGH (ref 4.0–10.5)
nRBC: 0 % (ref 0.0–0.2)

## 2021-12-28 LAB — URINE CULTURE: Culture: <10000 — AB

## 2021-12-28 LAB — COMPREHENSIVE METABOLIC PANEL
ALT: 43 U/L (ref 0–44)
AST: 65 U/L — ABNORMAL HIGH (ref 15–41)
Albumin: 2.8 g/dL — ABNORMAL LOW (ref 3.5–5.0)
Alkaline Phosphatase: 87 U/L (ref 38–126)
Anion gap: 6 (ref 5–15)
BUN: 17 mg/dL (ref 8–23)
CO2: 23 mmol/L (ref 22–32)
Calcium: 8.4 mg/dL — ABNORMAL LOW (ref 8.9–10.3)
Chloride: 110 mmol/L (ref 98–111)
Creatinine, Ser: 0.69 mg/dL (ref 0.44–1.00)
GFR, Estimated: >60 mL/min (ref >60)
Glucose, Bld: 121 mg/dL — ABNORMAL HIGH (ref 70–99)
Potassium: 3.7 mmol/L (ref 3.5–5.1)
Sodium: 139 mmol/L (ref 135–145)
Total Bilirubin: 0.7 mg/dL (ref 0.3–1.2)
Total Protein: 5.6 g/dL — ABNORMAL LOW (ref 6.5–8.1)

## 2021-12-28 MED ORDER — HYDROCODONE-ACETAMINOPHEN 5-325 MG PO TABS
1.0000 | ORAL_TABLET | ORAL | Status: DC | PRN
  Administered 2021-12-28 – 2021-12-29 (×3): 1 via ORAL
  Filled 2021-12-28 (×3): qty 1

## 2021-12-28 MED ORDER — BUPROPION HCL ER (SR) 150 MG PO TB12
150.0000 mg | ORAL_TABLET | Freq: Every day | ORAL | Status: DC
  Administered 2021-12-28 – 2021-12-29 (×2): 150 mg via ORAL
  Filled 2021-12-28 (×2): qty 1

## 2021-12-28 MED ORDER — HYDROCODONE-ACETAMINOPHEN 5-325 MG PO TABS
1.0000 | ORAL_TABLET | Freq: Three times a day (TID) | ORAL | Status: DC | PRN
  Administered 2021-12-28: 1 via ORAL
  Filled 2021-12-28: qty 1

## 2021-12-28 MED ORDER — AZITHROMYCIN 500 MG PO TABS
500.0000 mg | ORAL_TABLET | Freq: Every day | ORAL | Status: DC
  Administered 2021-12-28: 500 mg via ORAL
  Filled 2021-12-28: qty 1

## 2021-12-28 MED ORDER — AMOXICILLIN-POT CLAVULANATE 875-125 MG PO TABS
1.0000 | ORAL_TABLET | Freq: Two times a day (BID) | ORAL | Status: DC
  Administered 2021-12-28 – 2021-12-29 (×2): 1 via ORAL
  Filled 2021-12-28 (×2): qty 1

## 2021-12-28 MED ORDER — FOLIC ACID 1 MG PO TABS
1.0000 mg | ORAL_TABLET | Freq: Every day | ORAL | Status: DC
  Administered 2021-12-28 – 2021-12-29 (×2): 1 mg via ORAL
  Filled 2021-12-28 (×2): qty 1

## 2021-12-28 MED ORDER — KETOROLAC TROMETHAMINE 15 MG/ML IJ SOLN
15.0000 mg | Freq: Once | INTRAMUSCULAR | Status: AC
  Administered 2021-12-28: 15 mg via INTRAVENOUS
  Filled 2021-12-28: qty 1

## 2021-12-28 MED ORDER — LIDOCAINE 5 % EX PTCH
1.0000 | MEDICATED_PATCH | CUTANEOUS | Status: DC
  Administered 2021-12-28 – 2021-12-29 (×2): 1 via TRANSDERMAL
  Filled 2021-12-28 (×2): qty 1

## 2021-12-28 NOTE — TOC Progression Note (Addendum)
Transition of Care [facility]) - Progression Note    Patient Details  Name: Felicia Manning MRN: [id number] Date of Birth: Jun 21, 1955  [facility]) CM/SW Contact  Laurena Slimmer, RN Phone Number: 12/28/2021, 4:05 PM  Clinical Narrative:    Spoke with patient at the bedside to discuss her discharge plan. She is agreeable to [facility] PT/ OT. She does not have preference.  Referral sent and accepted by Meg from [facility]         Expected Discharge Plan and Services                                                 Social Determinants of Health (SDOH) Interventions    Readmission Risk Interventions     No data to display

## 2021-12-28 NOTE — TOC Initial Note (Signed)
Transition of Care [facility]) - Initial/Assessment Note    Patient Details  Name: Felicia Manning MRN: [id number] Date of Birth: 1955/06/22  Transition of Care [facility]) CM/SW Contact:    Laurena Slimmer, RN Phone Number: 12/28/2021, 9:53 AM  Clinical Narrative:                 Per PT/ OT evaluation HH is  recommended at discharge.   Attempt to reach patient unsuccessfully will reattempt at a later time.         Patient Goals and CMS Choice        Expected Discharge Plan and Services                                                Prior Living Arrangements/Services                       Activities of Daily Living Home Assistive Devices/Equipment: Environmental consultant (specify type) (rollator) ADL Screening (condition at time of admission) Patient's cognitive ability adequate to safely complete daily activities?: Yes Is the patient deaf or have difficulty hearing?: No Does the patient have difficulty seeing, even when wearing glasses/contacts?: No Does the patient have difficulty concentrating, remembering, or making decisions?: No Patient able to express need for assistance with ADLs?: Yes Does the patient have difficulty dressing or bathing?: No Independently performs ADLs?: Yes (appropriate for developmental age) Does the patient have difficulty walking or climbing stairs?: Yes Weakness of Legs: Both Weakness of Arms/Hands: Both  Permission Sought/Granted                  Emotional Assessment              Admission diagnosis:  Fever [R50.9] Pneumonia of left upper lobe due to infectious organism [J18.9] Sepsis without acute organ dysfunction, due to unspecified organism [facility]) [A41.9] Patient Active Problem List   Diagnosis Date Noted   Sepsis without acute organ dysfunction ([place]) 12/27/2021   Small cell carcinoma ([place]) 57/84/6962   Complicated UTI (urinary tract infection) 12/27/2021   Pneumonia of left upper lobe due to infectious organism  12/27/2021   Fever 12/26/2021   Brain metastasis 01/21/2021   Cancer of lower lobe of left lung ([place]) 12/02/2020   Sepsis due to pneumonia ([place]) 08/13/2017   Acute diverticulitis 06/18/2017   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   Fungal infection of toenail 06/11/2015   Osteoporosis, post-menopausal 06/11/2015   Barrett esophagus 05/02/2015   HCV (hepatitis C virus) 05/02/2015   HLD (hyperlipidemia) 05/02/2015   Adult hypothyroidism 05/02/2015   Kidney stone    Pyelonephritis    UTI (lower urinary tract infection) 04/08/2015   Chronic hepatitis C virus infection ([place]) 03/12/2015   Essential (primary) hypertension 03/12/2015   Gastro-esophageal reflux disease without esophagitis 03/12/2015   Tobacco use 10/06/2010   PCP:  Dion Body, MD Pharmacy:   [facility] [address] [place] [address] [place])  [address] Phone: [phone] Fax: [phone]  [facility] [place], [place] [facility] [address] [employer] [address] [place] [address] Phone: [phone] Fax: [phone]     Social Determinants of Health (SDOH) Interventions    Readmission Risk Interventions     No data to display

## 2021-12-28 NOTE — Progress Notes (Signed)
       CROSS COVER NOTE  NAME: Mikaylee Arseneau MRN: [id number] DOB : [birth year] ATTENDING PHYSICIAN: Richarda Osmond, MD    Date of Service   12/28/2021   HPI/Events of Note   Medication request for patient report of 9/10 rib pain not relieved by Tylenol.  Interventions   Assessment/Plan: Toradol Lidocaine patch    This document was prepared using Dragon voice recognition software and may include unintentional dictation errors.  Neomia Glass DNP, MBA, FNP-BC Nurse Practitioner [facility] Pager [phone]

## 2021-12-28 NOTE — Progress Notes (Addendum)
PROGRESS NOTE  Felicia Manning    DOB: 1955-12-18, 66 y.o.  BLT:[id number]    Code Status: Full Code   DOA: 12/26/2021   LOS: 2   Brief hospital course  Felicia Manning is a 66 y.o. 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 Rogue Bussing, and Dr. Grayland Ormond, oncology. Also has h/o thyroid cancer, T2DM, HLD, HTN, hepatitis C, hypothyroidism, GERD, Barrett's esophagus, osteoporosis, has dentures.   They presented from home to the ED on 12/26/2021 with fever/chills, productive cough, and left-sided rib pain worsening x 7 days. Ambulates with walker at baseline. She is currently undergoing chemotherapy and recently changed therapies, per patient. She states that she recently received a WBC stimulating treatment (udenyca).  In the ED, it was found that they had stable vital signs on room air.  Significant findings included negative covid screening, K+ 3.2 but otherwise normal metabolic panel, negative troponin, WBC 31.4 with neutrophil predominance, hgb 12.0, LA 3.5 . Urinalysis positive for nitrites, positive leukocytes and small hgb. Blood cultures collected.  CTA negative for acute PE. Imaging showed known nodules in lungs and liver with interval enlargement in left lower lobe from previous. No obvious infection.  They were initially treated with cefepime, vancomycin, LR.   Patient was admitted to medicine service for further workup and management of severe sepsis as outlined in detail below.  12/28/21 -stable ORA, no complaints.  Assessment & Plan  Principal Problem:   Fever Active Problems:   Sepsis due to pneumonia ([place])   Essential (primary) hypertension   Gastro-esophageal reflux disease without esophagitis   Adult hypothyroidism   Tobacco use   Sepsis without acute organ dysfunction ([facility])   Small cell carcinoma ([place])   Complicated UTI (urinary tract infection)   Pneumonia of  left upper lobe due to infectious organism  Severe Sepsis- met sepsis criteria on admission with elevated LA, leukocytosis, and HR of 92 on presentation. suspected source of infection of urine. She endorses dark urine and urgency but denies dysuria. Difficult to assess infection in lungs given cancer lesions but denies respiratory symptoms currently. no longer meeting SIRS criteria. WBC count unreliable surrogate for infection given her recent treatments. Received udenyca 11/10. She is immunocompromised.  Complicated UTI - urine Cx ordered, pending - follow blood cultures NGTD - continue Abx coverage for urine and lung primary  - currently on cefepime, azithromycin. Vancomycin discontinued with negative MRSA swab - antipyretics PRN - follow fever curve - FYI to oncologist, Dr. Grayland Ormond - PT/OT for physical deconditioning in setting of acute illness  HTN- well controlled - continue home meds  Hypothyroidism- TSH on 11/7 elevated to 49.4 with normal T4. Takes 168mg weekdays and 1724m weekend days - continue home meds at 17572mdaily  Macrocytic anemia- in relation to chemo. Hgb 11.2>9.5. no signs of bleeding - continue folic acid - repeat CBC am  Tobacco use - nicotine replacement PRN  GERD - PPI  Body mass index is 27.24 kg/m.  VTE ppx: enoxaparin (LOVENOX) injection 40 mg Start: 12/27/21 0845   Diet:     Diet   Diet heart healthy/carb modified Room service appropriate? Yes; Fluid consistency: Thin   Consultants: None   Subjective 12/28/21    Pt reports feeling improved today. Denies SOB, dysuria. Denies any active bleeding.    Objective   Vitals:   12/27/21 0741 12/27/21 1526 12/27/21 1950 12/28/21 0636  BP: 95/60 (!) 96/56 (!) 103/59 (!)Marland Kitchen  117/56  Pulse: 73 67 68 69  Resp: _0 Temp: 98.7 F (37.1 C) 98.2 F (36.8 C) (!) 97.4 F (36.3 C) 98 F (36.7 C)  TempSrc:      SpO2: 95% 95% 96% 96%  Weight:      Height:        Intake/Output Summary  (Last 24 hours) at 12/28/2021 0736 Last data filed at 12/27/2021 1921 Gross per 24 hour  Intake 1542.68 ml  Output --  Net 1542.68 ml    Filed Weights   12/26/21 1814 12/27/21 0119  Weight: 78.9 kg 78.9 kg     Physical Exam:  General: awake, alert, NAD HEENT: atraumatic, clear conjunctiva, anicteric sclera, MMM, hearing grossly normal Respiratory: normal respiratory effort. Cardiovascular: quick capillary refill, normal S1/S2, RRR, no JVD, murmurs Nervous: A&O x3. no gross focal neurologic deficits, normal speech Extremities: moves all equally, no edema, normal tone Skin: dry, intact, normal temperature, normal color. No rashes, lesions or ulcers on exposed skin Psychiatry: normal mood, congruent affect  Labs   I have personally reviewed the following labs and imaging studies CBC    Component Value Date/Time   WBC 18.8 (H) 12/28/2021 0517   RBC 2.95 (L) 12/28/2021 0517   HGB 9.5 (L) 12/28/2021 0517   HCT 29.4 (L) 12/28/2021 0517   PLT 136 (L) 12/28/2021 0517   MCV 99.7 12/28/2021 0517   MCH 32.2 12/28/2021 0517   MCHC 32.3 12/28/2021 0517   RDW 16.3 (H) 12/28/2021 0517   LYMPHSABS 0.8 12/27/2021 0114   MONOABS 0.1 12/27/2021 0114   EOSABS 0.1 12/27/2021 0114   BASOSABS 0.0 12/27/2021 0114      Latest Ref Rng & Units 12/28/2021    5:17 AM 12/27/2021    1:14 AM 12/26/2021    8:18 PM  BMP  Glucose 70 - 99 mg/dL 121  93  77   BUN 8 - 23 mg/dL _1 Creatinine 0.44 - 1.00 mg/dL 0.69  0.66  0.71   Sodium 135 - 145 mmol/L 139  142  139   Potassium 3.5 - 5.1 mmol/L 3.7  3.3  3.2   Chloride 98 - 111 mmol/L 110  111  107   CO2 22 - 32 mmol/L _2 Calcium 8.9 - 10.3 mg/dL 8.4  8.6  9.1     CT Angio Chest PE W/Cm &/Or Wo Cm  Result Date: 12/26/2021 CLINICAL DATA:  Metastatic left lower lobe non-small cell lung cancer, dizziness, cough, left posterior rib pain EXAM: CT ANGIOGRAPHY CHEST WITH CONTRAST TECHNIQUE: Multidetector CT imaging of the chest was  performed using the standard protocol during bolus administration of intravenous contrast. Multiplanar CT image reconstructions and MIPs were obtained to evaluate the vascular anatomy. RADIATION DOSE REDUCTION: This exam was performed according to the departmental dose-optimization program which includes automated exposure control, adjustment of the mA and/or kV according to patient size and/or use of iterative reconstruction technique. CONTRAST:  19m OMNIPAQUE IOHEXOL 350 MG/ML SOLN COMPARISON:  11/19/2021, 12/02/2021 FINDINGS: Cardiovascular: This is a technically adequate evaluation of the pulmonary vasculature. No filling defects or pulmonary emboli. The heart is unremarkable without pericardial effusion. No evidence of thoracic aortic aneurysm or dissection. Stable atherosclerosis. 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 Right chest wall port via internal jugular approach, tip within the atriocaval junction. Mediastinum/Nodes: Status post thyroidectomy. Trachea and esophagus are unremarkable. Small hiatal  hernia. No pathologic mediastinal, hilar, or axillary adenopathy. Lungs/Pleura: Left lower lobe consolidation in the posterior costophrenic angle obscures the known underlying nodule, with consolidation now measuring 2.7 x 1.6 cm image 121/5 not appreciably changed since prior exam. Other bilateral pulmonary nodules are again identified. Index nodules are as follows: Left lower lobe, image 126/5, 1.3 x 1.0 cm. Previously 1.1 x 0.9 cm. Right upper lobe, image 35/5, 1.0 x 0.7 cm. Previously 1.1 x 0.9 cm. There are multiple new subcentimeter ground-glass nodules throughout the bilateral upper lobes, concerning for progressive metastatic disease over underlying inflammation or infection. No effusion or pneumothorax.  Central airways are patent. Upper Abdomen: There are multiple new and enlarging hypodense lesions throughout the liver parenchyma consistent with  progressive metastatic disease. Index lesion within the left lobe liver image 132/4 measures 3.7 x 3.0 cm, previously 1.4 x 1.3 cm. Musculoskeletal: There are no acute or destructive bony lesions. Reconstructed images demonstrate no additional findings. Review of the MIP images confirms the above findings. IMPRESSION: 1. No evidence of pulmonary embolus. 2. Multiple pulmonary nodules as above, with interval enlargement of the left lower lobe nodule consistent with progression of disease. 3. Progressive hepatic metastatic disease. 4. Progressive bilateral subcentimeter ground-glass nodularity, greatest in the upper lobes, which could reflect progressive metastatic disease versus nonspecific inflammatory/infectious etiology. 5. Aortic Atherosclerosis (ICD10-I70.0). 6. Hiatal hernia. Electronically Signed   By: Randa Ngo M.D.   On: 12/26/2021 21:53   DG Chest 2 View  Result Date: 12/26/2021 CLINICAL DATA:  c/o fall 3-4 days ago, low back and rib pain since that time. Also endorses nausea since chemo treatments changed several days ago. EXAM: CHEST - 2 VIEW COMPARISON:  03/26/2019 FINDINGS: Interval placement of right IJ power port to the distal SVC. No pneumothorax. Patchy airspace infiltrate in the posterior left lower lobe, new since previous. Right lung clear. Heart size and mediastinal contours are within normal limits. No effusion. Visualized bones unremarkable.  No pneumothorax. IMPRESSION: Patchy left lower lobe infiltrate, new since previous. Electronically Signed   By: Lucrezia Europe M.D.   On: 12/26/2021 19:15    Disposition Plan & Communication  Patient status: Inpatient  Admitted From: Home Planned disposition location: Home Anticipated discharge date: 11/14 pending cultures results and transition to PO Abx  Family Communication: none    Author: Richarda Osmond, DO [facility] 12/28/2021, 7:36 AM   Available by Epic secure chat 7AM-7PM. If 7PM-7AM, please contact  night-coverage.  [facility] contact information found on [email or web address].

## 2021-12-28 NOTE — Consult Note (Signed)
[place]  [phone] [phone]) [phone]  ID: Selinda Orion OB: 05-13-1955  MR#: [id number]  DTO#:[id number]  Patient Care Team: Dion Body, MD as PCP - General (Family Medicine) Leata Mouse (Inactive) Byrnett, Forest Gleason, MD (General Surgery) Telford Nab, RN as Oncology Nurse Navigator Cammie Sickle, MD as Consulting Physician (Oncology)  REFERRING PROVIDER: Dr. Ouida Sills  REASON FOR REFERRAL: stage IV small cell lung cancer. Admitted with sepsis   HPI: Ellene Bloodsaw is a 66 y.o. female diagnosed with stage IV lung adenocarcinoma in October 2022 and was treated with carboplatin Alimta and Keytruda.  She received RT to progressive hilar and mediastinal lymph node in June 2023.  In October 2023, she had progression in the liver which was rebiopsied and came positive for small cell lung cancer.  She received first cycle of carboplatin, etoposide and Tecentriq on 12/22/2021 with Udenyca support.  She presented to ED on 12/26/2021 for low temperature, cough, nausea and vomiting.  CTA was negative for PE, showed lobe multiple pulmonary nodules and liver mets.  UA was suspicious for infection.  Urine culture is pending.  She is on broad-spectrum antibiotics.  Patient was seen today at bedside.  She reports feeling much better compared to admission today.  Denies any fevers or low temperature temperature since being here.  Denies any nausea or vomiting.  REVIEW OF SYSTEMS:   ROS  As per HPI. Otherwise, a complete review of systems is negative.  PAST MEDICAL HISTORY: Past Medical History:  Diagnosis Date   Barrett's esophagus    COPD (chronic obstructive pulmonary disease) ([place])    Dysrhythmia    stress related at work.   GERD (gastroesophageal reflux disease)    H/O malignant neoplasm of thyroid 01/29/2014   Hepatitis C 2008   [place] 2017   History of kidney stones    Hyperlipidemia    Hypertension    Hypothyroidism     Liver cancer ([place])    metastasized from Lung   Nephrolithiasis    Non-small cell carcinoma of left lung ([place])    Osteoporosis    Pulmonary mass    Thyroid cancer ([place]) 2012   Thyroid cancer ([place])    Tumor    tumor per pt back of rt eye   Type 2 diabetes mellitus ([place])    Wears dentures    Full upper and lower.  Usually only wears upper    PAST SURGICAL HISTORY: Past Surgical History:  Procedure Laterality Date   ABDOMINAL HYSTERECTOMY  2006   CATARACT EXTRACTION W/PHACO Right 12/08/2021   Procedure: CATARACT EXTRACTION PHACO AND INTRAOCULAR LENS PLACEMENT (IOC) RIGHT DIABETIC 6.62 00:35.1;  Surgeon: Birder Robson, MD;  Location: [facility];  Service: Ophthalmology;  Laterality: Right;  Diabetic   COLONOSCOPY WITH PROPOFOL N/A 04/25/2015   Procedure: COLONOSCOPY WITH PROPOFOL;  Surgeon: Josefine Class, MD;  Location: [facility] ENDOSCOPY;  Service: Endoscopy;  Laterality: N/A;   CYSTOSCOPY W/ RETROGRADES Bilateral 05/19/2015   Procedure: CYSTOSCOPY WITH RETROGRADE PYELOGRAM;  Surgeon: Hollice Espy, MD;  Location: [facility]S;  Service: Urology;  Laterality: Bilateral;   CYSTOSCOPY W/ URETERAL STENT PLACEMENT Right 05/26/2015   Procedure: CYSTOSCOPY WITH STENT REPLACEMENT;  Surgeon: Hollice Espy, MD;  Location: [facility];  Service: Urology;  Laterality: Right;   CYSTOSCOPY WITH STENT PLACEMENT Right 05/19/2015   Procedure: CYSTOSCOPY WITH STENT PLACEMENT;  Surgeon: Hollice Espy, MD;  Location: [facility];  Service: Urology;  Laterality: Right;   CYSTOSCOPY WITH STENT PLACEMENT Right 05/26/2015  Procedure: CYSTOSCOPY WITH STENT PLACEMENT;  Surgeon: Hollice Espy, MD;  Location: [facility];  Service: Urology;  Laterality: Right;   CYSTOSCOPY WITH STENT PLACEMENT Right 06/25/2016   Procedure: CYSTOSCOPY WITH STENT PLACEMENT;  Surgeon: Nickie Retort, MD;  Location: [facility];  Service: Urology;  Laterality: Right;   CYSTOSCOPY WITH STENT PLACEMENT Left 10/31/2017   Procedure:  CYSTOSCOPY WITH STENT PLACEMENT;  Surgeon: Hollice Espy, MD;  Location: [facility];  Service: Urology;  Laterality: Left;   CYSTOSCOPY/URETEROSCOPY/HOLMIUM LASER/STENT PLACEMENT Left 07/23/2015   Procedure: CYSTOSCOPY/URETEROSCOPY/HOLMIUM LASER/STENT PLACEMENT/RETROGRADE PYELOGRAM;  Surgeon: Hollice Espy, MD;  Location: [facility];  Service: Urology;  Laterality: Left;   CYSTOSCOPY/URETEROSCOPY/HOLMIUM LASER/STENT PLACEMENT Left 11/16/2017   Procedure: CYSTOSCOPY/URETEROSCOPY/HOLMIUM LASER/STENT Exchange;  Surgeon: Hollice Espy, MD;  Location: [facility];  Service: Urology;  Laterality: Left;   ESOPHAGOGASTRODUODENOSCOPY (EGD) WITH PROPOFOL N/A 04/25/2015   Procedure: ESOPHAGOGASTRODUODENOSCOPY (EGD) WITH PROPOFOL;  Surgeon: Josefine Class, MD;  Location: [facility] ENDOSCOPY;  Service: Endoscopy;  Laterality: N/A;   EYE SURGERY Bilateral 1964   lazy eye repair   EYE SURGERY Bilateral 2001   lazy eye repair   IR IMAGING GUIDED PORT INSERTION  12/08/2020   LITHOTRIPSY  2009   THYROIDECTOMY  2010   URETEROSCOPY WITH HOLMIUM LASER LITHOTRIPSY Right 05/19/2015   Procedure: URETEROSCOPY WITH HOLMIUM LASER LITHOTRIPSY;  Surgeon: Hollice Espy, MD;  Location: [facility];  Service: Urology;  Laterality: Right;   URETEROSCOPY WITH HOLMIUM LASER LITHOTRIPSY Right 05/26/2015   Procedure: URETEROSCOPY WITH HOLMIUM LASER LITHOTRIPSY;  Surgeon: Hollice Espy, MD;  Location: [facility];  Service: Urology;  Laterality: Right;   URETEROSCOPY WITH HOLMIUM LASER LITHOTRIPSY Right 06/25/2016   Procedure: URETEROSCOPY WITH HOLMIUM LASER LITHOTRIPSY;  Surgeon: Nickie Retort, MD;  Location: [facility];  Service: Urology;  Laterality: Right;    FAMILY HISTORY: Family History  Problem Relation Age of Onset   CAD Mother    Heart disease Mother    Dementia Father    Bladder Cancer Neg Hx    Prostate cancer Neg Hx    Kidney cancer Neg Hx    Breast cancer Neg Hx     HEALTH MAINTENANCE: Social History   Tobacco  Use   Smoking status: Former    Packs/day: 1.00    Years: 50.00    Total pack years: 50.00    Types: Cigarettes    Quit date: 08/15/2021    Years since quitting: 0.3   Smokeless tobacco: Never   Tobacco comments:    Started smoking age 13  Vaping Use   Vaping Use: Former  Substance Use Topics   Alcohol use: Not Currently    Comment: socially   Drug use: No     No Known Allergies  Current Facility-Administered Medications  Medication Dose Route Frequency Provider Last Rate Last Admin   acetaminophen (TYLENOL) tablet 650 mg  650 mg Oral Q6H PRN Para Skeans, MD   650 mg at 12/28/21 0840   Or   acetaminophen (TYLENOL) suppository 650 mg  650 mg Rectal Q6H PRN Para Skeans, MD       albuterol (PROVENTIL) (2.5 MG/3ML) 0.083% nebulizer solution 2.5 mg  2.5 mg Inhalation Q6H PRN Para Skeans, MD       alum & mag hydroxide-simeth (MAALOX/MYLANTA) 200-200-20 MG/5ML suspension 30 mL  30 mL Oral Q6H PRN Richarda Osmond, MD   30 mL at 12/27/21 0642   amoxicillin-clavulanate (AUGMENTIN) 875-125 MG per tablet 1 tablet  1 tablet Oral Q12H Ouida Sills,  Chelsey L, MD   1 tablet at 12/28/21 2012   atorvastatin (LIPITOR) tablet 80 mg  80 mg Oral QHS Para Skeans, MD       azithromycin [facility]) tablet 500 mg  500 mg Oral QHS Benita Gutter, RPH   500 mg at 12/28/21 2012   bisacodyl (DULCOLAX) EC tablet 5 mg  5 mg Oral Daily PRN Para Skeans, MD       buPROPion [facility]) 12 hr tablet 150 mg  150 mg Oral Daily Doristine Mango L, MD   150 mg at 12/28/21 1250   Chlorhexidine Gluconate Cloth 2 % PADS 6 each  6 each Topical Daily Richarda Osmond, MD   6 each at 12/28/21 0841   enoxaparin (LOVENOX) injection 40 mg  40 mg Subcutaneous Q24H Richarda Osmond, MD   40 mg at 57/32/20 2542   folic acid (FOLVITE) tablet 1 mg  1 mg Oral Daily Richarda Osmond, MD   1 mg at 12/28/21 1250   gabapentin (NEURONTIN) capsule 100 mg  100 mg Oral BID Para Skeans, MD   100 mg at 12/28/21 2012    HYDROcodone-acetaminophen (NORCO/VICODIN) 5-325 MG per tablet 1 tablet  1 tablet Oral Q4H PRN Richarda Osmond, MD   1 tablet at 12/28/21 1811   insulin aspart (novoLOG) injection 0-15 Units  0-15 Units Subcutaneous TID WC Para Skeans, MD   2 Units at 12/28/21 1251   levothyroxine (SYNTHROID) tablet 175 mcg  175 mcg Oral Q0600 Para Skeans, MD   175 mcg at 12/28/21 0552   lidocaine (LIDODERM) 5 % 1 patch  1 patch Transdermal Q24H Foust, Katy L, NP   1 patch at 12/28/21 0506   losartan (COZAAR) tablet 100 mg  100 mg Oral Chong Sicilian, Esmeralda Links V, MD   100 mg at 12/28/21 0840   metoprolol succinate (TOPROL-XL) 24 hr tablet 25 mg  25 mg Oral Chong Sicilian, Esmeralda Links V, MD   25 mg at 12/28/21 0840   montelukast (SINGULAIR) tablet 10 mg  10 mg Oral QHS Florina Ou V, MD   10 mg at 12/28/21 2017   ondansetron (ZOFRAN) tablet 8 mg  8 mg Oral Q8H PRN Para Skeans, MD       pantoprazole (PROTONIX) EC tablet 40 mg  40 mg Oral Daily Florina Ou V, MD   40 mg at 12/28/21 0841   phenol (CHLORASEPTIC) mouth spray 1 spray  1 spray Mouth/Throat PRN Para Skeans, MD   1 spray at 12/27/21 0159   polyethylene glycol (MIRALAX / GLYCOLAX) packet 17 g  17 g Oral Daily PRN Para Skeans, MD       Facility-Administered Medications Ordered in Other Encounters  Medication Dose Route Frequency Provider Last Rate Last Admin   heparin lock flush 100 UNIT/ML injection             OBJECTIVE: Vitals:   12/28/21 1539 12/28/21 2022  BP: (!) 95/56 (!) 107/58  Pulse: 65 69  Resp: 17 17  Temp: 98.6 F (37 C) (!) 97.4 F (36.3 C)  SpO2: 97% 95%     Body mass index is 27.24 kg/m.      General: Well-developed, well-nourished, no acute distress. Eyes: Pink conjunctiva, anicteric sclera. HEENT: Normocephalic, moist mucous membranes, clear oropharnyx. Lungs: Clear to auscultation bilaterally. Heart: Regular rate and rhythm. No rubs, murmurs, or gallops. Abdomen: Soft, nontender, nondistended. No organomegaly noted,  normoactive bowel sounds. Musculoskeletal: No edema, cyanosis,  or clubbing. Neuro: Alert, answering all questions appropriately. Cranial nerves grossly intact. Skin: No rashes or petechiae noted. Psych: Normal affect. Lymphatics: No cervical, calvicular, axillary or inguinal LAD.   LAB RESULTS:  Lab Results  Component Value Date   NA 139 12/28/2021   K 3.7 12/28/2021   CL 110 12/28/2021   CO2 23 12/28/2021   GLUCOSE 121 (H) 12/28/2021   BUN 17 12/28/2021   CREATININE 0.69 12/28/2021   CALCIUM 8.4 (L) 12/28/2021   PROT 5.6 (L) 12/28/2021   ALBUMIN 2.8 (L) 12/28/2021   AST 65 (H) 12/28/2021   ALT 43 12/28/2021   ALKPHOS 87 12/28/2021   BILITOT 0.7 12/28/2021   GFRNONAA >60 12/28/2021   GFRAA >60 05/11/2019    Lab Results  Component Value Date   WBC 18.8 (H) 12/28/2021   NEUTROABS 25.2 (H) 12/27/2021   HGB 9.5 (L) 12/28/2021   HCT 29.4 (L) 12/28/2021   MCV 99.7 12/28/2021   PLT 136 (L) 12/28/2021    No results found for: "TIBC", "FERRITIN", "IRONPCTSAT"   STUDIES: CT Angio Chest PE W/Cm &/Or Wo Cm  Result Date: 12/26/2021 CLINICAL DATA:  Metastatic left lower lobe non-small cell lung cancer, dizziness, cough, left posterior rib pain EXAM: CT ANGIOGRAPHY CHEST WITH CONTRAST TECHNIQUE: Multidetector CT imaging of the chest was performed using the standard protocol during bolus administration of intravenous contrast. Multiplanar CT image reconstructions and MIPs were obtained to evaluate the vascular anatomy. RADIATION DOSE REDUCTION: This exam was performed according to the departmental dose-optimization program which includes automated exposure control, adjustment of the mA and/or kV according to patient size and/or use of iterative reconstruction technique. CONTRAST:  23mL OMNIPAQUE IOHEXOL 350 MG/ML SOLN COMPARISON:  11/19/2021, 12/02/2021 FINDINGS: Cardiovascular: This is a technically adequate evaluation of the pulmonary vasculature. No filling defects or pulmonary  emboli. The heart is unremarkable without pericardial effusion. No evidence of thoracic aortic aneurysm or dissection. Stable atherosclerosis. 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 Right chest wall port via internal jugular approach, tip within the atriocaval junction. Mediastinum/Nodes: Status post thyroidectomy. Trachea and esophagus are unremarkable. Small hiatal hernia. No pathologic mediastinal, hilar, or axillary adenopathy. Lungs/Pleura: Left lower lobe consolidation in the posterior costophrenic angle obscures the known underlying nodule, with consolidation now measuring 2.7 x 1.6 cm image 121/5 not appreciably changed since prior exam. Other bilateral pulmonary nodules are again identified. Index nodules are as follows: Left lower lobe, image 126/5, 1.3 x 1.0 cm. Previously 1.1 x 0.9 cm. Right upper lobe, image 35/5, 1.0 x 0.7 cm. Previously 1.1 x 0.9 cm. There are multiple new subcentimeter ground-glass nodules throughout the bilateral upper lobes, concerning for progressive metastatic disease over underlying inflammation or infection. No effusion or pneumothorax.  Central airways are patent. Upper Abdomen: There are multiple new and enlarging hypodense lesions throughout the liver parenchyma consistent with progressive metastatic disease. Index lesion within the left lobe liver image 132/4 measures 3.7 x 3.0 cm, previously 1.4 x 1.3 cm. Musculoskeletal: There are no acute or destructive bony lesions. Reconstructed images demonstrate no additional findings. Review of the MIP images confirms the above findings. IMPRESSION: 1. No evidence of pulmonary embolus. 2. Multiple pulmonary nodules as above, with interval enlargement of the left lower lobe nodule consistent with progression of disease. 3. Progressive hepatic metastatic disease. 4. Progressive bilateral subcentimeter ground-glass nodularity, greatest in the upper lobes, which could reflect  progressive metastatic disease versus nonspecific inflammatory/infectious etiology. 5. Aortic Atherosclerosis (ICD10-I70.0). 6.  Hiatal hernia. Electronically Signed   By: Randa Ngo M.D.   On: 12/26/2021 21:53   DG Chest 2 View  Result Date: 12/26/2021 CLINICAL DATA:  c/o fall 3-4 days ago, low back and rib pain since that time. Also endorses nausea since chemo treatments changed several days ago. EXAM: CHEST - 2 VIEW COMPARISON:  03/26/2019 FINDINGS: Interval placement of right IJ power port to the distal SVC. No pneumothorax. Patchy airspace infiltrate in the posterior left lower lobe, new since previous. Right lung clear. Heart size and mediastinal contours are within normal limits. No effusion. Visualized bones unremarkable.  No pneumothorax. IMPRESSION: Patchy left lower lobe infiltrate, new since previous. Electronically Signed   By: Lucrezia Europe M.D.   On: 12/26/2021 19:15   US BIOPSY (LIVER)  Result Date: 12/11/2021 INDICATION: Lung cancer- new liver lesions EXAM: Ultrasound-guided liver lesion core needle biopsy MEDICATIONS: None. ANESTHESIA/SEDATION: Moderate (conscious) sedation was employed during this procedure. A total of Versed 1.5 mg and Fentanyl 50 mcg was administered intravenously by the radiology nurse. Total intra-service moderate Sedation Time: 13 minutes. The patient's level of consciousness and vital signs were monitored continuously by radiology nursing throughout the procedure under my direct supervision. COMPLICATIONS: None immediate. PROCEDURE: Informed written consent was obtained from the patient after a thorough discussion of the procedural risks, benefits and alternatives. All questions were addressed. Maximal Sterile Barrier Technique was utilized including caps, mask, sterile gowns, sterile gloves, sterile drape, hand hygiene and skin antiseptic. A timeout was performed prior to the initiation of the procedure. The patient was placed supine on the exam table. Ultrasound  of the liver demonstrated focal hypoechoic lesion in the left hepatic lobe, compatible with one of the masses identified on recent cross-sectional imaging. Skin entry site was marked, and the overlying skin was prepped draped in the standard sterile fashion. Local analgesia was obtained with 1% lidocaine. Using ultrasound guidance, a 17 gauge introducer needle was advanced towards the identified hypoechoic mass. Subsequently, core needle biopsy was performed of the hypoechoic mass in the left hepatic lobe using an 18 gauge core biopsy device x3 total passes. Specimens were submitted in formalin to pathology for further handling. Limited postprocedure imaging demonstrated no hematoma. A clean dressing was placed after manual hemostasis. The patient tolerated the procedure well without immediate complication. IMPRESSION: Successful ultrasound-guided core needle biopsy of focal lesion in the left hepatic lobe. Electronically Signed   By: Albin Felling M.D.   On: 12/11/2021 13:42   NM Bone Scan Whole Body  Result Date: 12/04/2021 CLINICAL DATA:  Non-small cell lung cancer. Recurrent lung cancer with hip pain. No history of fracture or recent trauma. EXAM: NUCLEAR MEDICINE WHOLE BODY BONE SCAN TECHNIQUE: Whole body anterior and posterior images were obtained approximately 3 hours after intravenous injection of radiopharmaceutical. RADIOPHARMACEUTICALS:  19.7 mCi Technetium-52m MDP IV COMPARISON:  CT chest abdomen and pelvis 11/19/2021. Pelvis and hip radiographs 10/29/2021. PET-CT 07/23/2021 FINDINGS: Foci of mildly increased activity are demonstrated in the anterolateral left ninth rib, corresponding to an apparent old fracture on the recent CT. Another focus of mildly increased activity is demonstrated in the posterior right ninth rib at the costovertebral junction. No corresponding radiographic abnormality is demonstrated suggesting that this likely represents degenerative change. No definitive metastatic bone  uptake is identified. IMPRESSION: 1. No scintigraphic evidence of bone metastasis. Electronically Signed   By: Lucienne Capers M.D.   On: 12/04/2021 20:03    ASSESSMENT AND PLAN:   Torianne Laflam is a 66 y.o.  female with pmh of stage IV lung adenocarcinoma in October 2022 and was treated with carboplatin Alimta and Keytruda.  She received RT to progressive hilar and mediastinal lymph node in June 2023.  In October 2023, she had progression in the liver which was rebiopsied and came positive for small cell lung cancer.  She received first cycle of carboplatin, etoposide and Tecentriq on 12/22/2021 with Udenyca support.  She presented to ED on 12/26/2021 for low temperature, cough, nausea and vomiting concerning for sepsis.  CTA was negative for PE, showed lobe multiple pulmonary nodules and liver mets.  UA was suspicious for infection.  Urine culture is pending.  She is on broad-spectrum antibiotics.  Plan- -agree with antibiotics per the primary team. -She received Udenyca with chemotherapy which can cause leukocytosis. -She will follow-up with Dr. Jacinto Reap on 01/19/2022 for labs and cycle 2 of chemotherapy.  Of note, the chemo was delayed by a week because patient is traveling.  Patient expressed understanding and was in agreement with this plan. She also understands that She can call clinic at any time with any questions, concerns, or complaints.   I spent a total of 55 minutes reviewing chart data, face-to-face evaluation with the patient, counseling and coordination of care as detailed above.  Jane Canary, MD   12/28/2021 9:17 PM

## 2021-12-28 NOTE — Progress Notes (Signed)
PHARMACIST - PHYSICIAN COMMUNICATION  CONCERNING: Antibiotic IV to Oral Route Change Policy  RECOMMENDATION: This patient is receiving azithromycin by the intravenous route.  Based on criteria approved by the Pharmacy and Therapeutics Committee, the antibiotic(s) is/are being converted to the equivalent oral dose form(s).   DESCRIPTION: These criteria include: Patient being treated for a respiratory tract infection, urinary tract infection, cellulitis or clostridium difficile associated diarrhea if on metronidazole The patient is not neutropenic and does not exhibit a GI malabsorption state The patient is eating (either orally or via tube) and/or has been taking other orally administered medications for a least 24 hours The patient is improving clinically and has a Tmax < 100.5  If you have questions about this conversion, please contact the [place] 12/28/21

## 2021-12-29 ENCOUNTER — Other Ambulatory Visit: Payer: Self-pay | Admitting: Oncology

## 2021-12-29 DIAGNOSIS — A419 Sepsis, unspecified organism: Secondary | ICD-10-CM | POA: Diagnosis not present

## 2021-12-29 DIAGNOSIS — N390 Urinary tract infection, site not specified: Secondary | ICD-10-CM | POA: Diagnosis not present

## 2021-12-29 DIAGNOSIS — C3490 Malignant neoplasm of unspecified part of unspecified bronchus or lung: Secondary | ICD-10-CM | POA: Diagnosis not present

## 2021-12-29 DIAGNOSIS — J189 Pneumonia, unspecified organism: Secondary | ICD-10-CM | POA: Diagnosis not present

## 2021-12-29 LAB — CBC
HCT: 29.7 % — ABNORMAL LOW (ref 36.0–46.0)
Hemoglobin: 9.6 g/dL — ABNORMAL LOW (ref 12.0–15.0)
MCH: 32.3 pg (ref 26.0–34.0)
MCHC: 32.3 g/dL (ref 30.0–36.0)
MCV: 100 fL (ref 80.0–100.0)
Platelets: 115 10*3/uL — ABNORMAL LOW (ref 150–400)
RBC: 2.97 MIL/uL — ABNORMAL LOW (ref 3.87–5.11)
RDW: 16.1 % — ABNORMAL HIGH (ref 11.5–15.5)
WBC: 10.3 10*3/uL (ref 4.0–10.5)
nRBC: 0 % (ref 0.0–0.2)

## 2021-12-29 LAB — GLUCOSE, CAPILLARY: Glucose-Capillary: 107 mg/dL — ABNORMAL HIGH (ref 70–99)

## 2021-12-29 MED ORDER — HEPARIN SOD (PORK) LOCK FLUSH 100 UNIT/ML IV SOLN
500.0000 [IU] | Freq: Once | INTRAVENOUS | Status: AC
  Administered 2021-12-29: 500 [IU] via INTRAVENOUS
  Filled 2021-12-29: qty 5

## 2021-12-29 MED ORDER — AMOXICILLIN-POT CLAVULANATE 875-125 MG PO TABS: 1.0000 | ORAL_TABLET | Freq: Two times a day (BID) | ORAL | 0 refills | Status: AC

## 2021-12-29 MED ORDER — LEVOTHYROXINE SODIUM 175 MCG PO TABS: 175.0000 ug | ORAL_TABLET | Freq: Every day | ORAL | 0 refills | Status: AC

## 2021-12-29 NOTE — Discharge Summary (Signed)
Physician Discharge Summary  Patient: Felicia Manning MOQ:[id number] DOB: Dec 20, 1955   Code Status: Full Code Admit date: 12/26/2021 Discharge date: 12/29/2021 Disposition: Home, PT and OT PCP: Dion Body, MD  Recommendations for Outpatient Follow-up:  Follow up with PCP within 1-2 weeks Regarding general hospital follow up. Changes to chronic medications as listed below for low blood pressures while admitted. Recommend TSH in 3-4 weeks Follow up with heme/onc Regarding continuing her chemotherapy.  Recommend CBC  Discharge Diagnoses:  Principal Problem:   Fever Active Problems:   Sepsis due to pneumonia ([facility])   Essential (primary) hypertension   Gastro-esophageal reflux disease without esophagitis   Adult hypothyroidism   Tobacco use   Sepsis without acute organ dysfunction (HCC)   Small cell carcinoma (HCC)   Complicated UTI (urinary tract infection)   Pneumonia of left upper lobe due to infectious organism   Primary malignant neoplasm of lung metastatic to other site [facility])   High risk for chemotherapy-induced infectious complication  Brief Hospital Course Summary: [place] Sylvan is a 66 y.o. 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 Rogue Bussing, and Dr. Grayland Ormond, oncology. Also has h/o thyroid cancer, T2DM, HLD, HTN, hepatitis C, hypothyroidism, GERD, Barrett's esophagus, osteoporosis, has dentures.    They presented from home to the ED on 12/26/2021 with fever/chills, productive cough, and left-sided rib pain worsening x 7 days. Ambulates with walker at baseline. She is currently undergoing chemotherapy and recently changed therapies, per patient. She states that she recently received a WBC stimulating treatment (udenyca).   In the ED, it was found that they had stable vital signs on room air.  Significant findings included negative covid screening, K+ 3.2  but otherwise normal metabolic panel, negative troponin, WBC 31.4 with neutrophil predominance, hgb 12.0, LA 3.5 . Urinalysis positive for nitrites, positive leukocytes and small hgb. Blood cultures collected.  CTA negative for acute PE. Imaging showed known nodules in lungs and liver with interval enlargement in left lower lobe from previous. No obvious infection.   They were initially treated with cefepime, vancomycin, LR.    Given her relative immune suppressed state and difficulty in assessing the severity of her infection (malignant lesions occluding infection on imaging and falsely elevated WBC count) she remained on IV Abx until cultures completed. Discontinued vancomycin with negative MRSA nasal swab and narrowed antibiotics as able.  Blood cultures negative x3 days. Urine cultures <10,000 e coli.  Throughout her stay, her vitals remained stable on room air and was afebrile.  She was discharged with augmentin to complete her treatment.   Discontinued her amlodipine for stable BP without.  Modified thyroid medication to 169mg daily to simplify routine and had h/o elevated TSH (normal 4). All other chronic conditions were treated with home medications.   Discharge Condition: Good, improved Recommended discharge diet: Regular healthy diet  Consultations: Heme/onc  Procedures/Studies: None   Allergies as of 12/29/2021   No Known Allergies      Medication List     STOP taking these medications    amLODipine 5 MG tablet Commonly known as: NORVASC   atorvastatin 80 MG tablet Commonly known as: LIPITOR       TAKE these medications    Accu-Chek Softclix Lancets lancets Check glucose once a day   Advair HFA 45-21 MCG/ACT inhaler Generic drug: fluticasone-salmeterol Inhale 2 puffs into the lungs 2 (two) times daily.   albuterol 108 (90 Base)  MCG/ACT inhaler Commonly known as: VENTOLIN HFA Inhale 2 puffs into the lungs every 6 (six) hours as needed for wheezing or  shortness of breath.   amoxicillin-clavulanate 875-125 MG tablet Commonly known as: AUGMENTIN Take 1 tablet by mouth every 12 (twelve) hours for 5 days.   ARTIFICIAL TEAR OP Apply to eye.   AZO CRANBERRY PO Take 1 tablet by mouth daily.   benzonatate 100 MG capsule Commonly known as: Tessalon Perles Take 1 capsule (100 mg total) by mouth 3 (three) times daily as needed for cough.   blood glucose meter kit and supplies Kit Dispense based on patient and insurance preference. Use up to three times daily as directed.   buPROPion 150 MG 12 hr tablet Commonly known as: WELLBUTRIN SR Take 150 mg by mouth daily.   cholecalciferol 1000 units tablet Commonly known as: VITAMIN D Take 2,000 Units by mouth daily.   folic acid 1 MG tablet Commonly known as: FOLVITE Take 1 tablet (1 mg total) by mouth daily.   gabapentin 100 MG capsule Commonly known as: Neurontin Take 1 capsule (100 mg total) by mouth 2 (two) times daily. What changed:  how much to take when to take this   glipiZIDE 5 MG tablet Commonly known as: Glucotrol Take 1 tablet (5 mg total) by mouth 2 (two) times daily before a meal.   HYDROcodone-acetaminophen 5-325 MG tablet Commonly known as: NORCO/VICODIN Take 1 tablet by mouth every 8 (eight) hours as needed for moderate pain. What changed: when to take this   levothyroxine 175 MCG tablet Commonly known as: SYNTHROID Take 1 tablet (175 mcg total) by mouth daily before breakfast. What changed:  See the new instructions. Another medication with the same name was removed. Continue taking this medication, and follow the directions you see here.   lidocaine-prilocaine cream Commonly known as: EMLA Apply 1 application topically as needed.   loperamide 2 MG capsule Commonly known as: IMODIUM Take by mouth as needed for diarrhea or loose stools.   losartan 100 MG tablet Commonly known as: COZAAR Take 100 mg by mouth every morning.   metoprolol succinate 25 MG  24 hr tablet Commonly known as: TOPROL-XL Take 25 mg by mouth every morning.   montelukast 10 MG tablet Commonly known as: SINGULAIR Take 1 tablet (10 mg total) by mouth at bedtime.   nystatin 100000 UNIT/ML suspension Commonly known as: MYCOSTATIN Swish and swallow. Don't drink fluids immediately following administration.   omeprazole 20 MG capsule Commonly known as: PRILOSEC Take 20 mg by mouth daily before breakfast.   ondansetron 8 MG tablet Commonly known as: ZOFRAN Take 1 tablet (8 mg total) by mouth every 8 (eight) hours as needed for nausea or vomiting. Start on the third day after chemotherapy   prochlorperazine 10 MG tablet Commonly known as: COMPAZINE Take 1 tablet (10 mg total) by mouth every 6 (six) hours as needed for nausea or vomiting.   sucralfate 1 g tablet Commonly known as: Carafate Take 1 tablet (1 g total) by mouth 4 (four) times daily -  with meals and at bedtime.         Subjective   Pt reports feeling well today. She denies CP, SOB, dysuria. All questions and concerns were answered at time of encounter with exception of her inquiry for follow up with oncology. I asked her to reach out to their office to schedule and she was agreeable.   Objective  Blood pressure (!) 107/57, pulse 68, temperature 98.1 F (36.7  C), temperature source Oral, resp. rate 17, height _0  (1.702 m), weight 80.6 kg, SpO2 96 %.   General: Pt is alert, awake, not in acute distress Cardiovascular: RRR, S1/S2 +, no rubs, no gallops Respiratory: CTA bilaterally, no wheezing, no rhonchi Abdominal: Soft, NT, ND, bowel sounds + Extremities: no edema, no cyanosis  The results of significant diagnostics from this hospitalization (including imaging, microbiology, ancillary and laboratory) are listed below for reference.   Imaging studies: CT Angio Chest PE W/Cm &/Or Wo Cm  Result Date: 12/26/2021 CLINICAL DATA:  Metastatic left lower lobe non-small cell lung cancer,  dizziness, cough, left posterior rib pain EXAM: CT ANGIOGRAPHY CHEST WITH CONTRAST TECHNIQUE: Multidetector CT imaging of the chest was performed using the standard protocol during bolus administration of intravenous contrast. Multiplanar CT image reconstructions and MIPs were obtained to evaluate the vascular anatomy. RADIATION DOSE REDUCTION: This exam was performed according to the departmental dose-optimization program which includes automated exposure control, adjustment of the mA and/or kV according to patient size and/or use of iterative reconstruction technique. CONTRAST:  70m OMNIPAQUE IOHEXOL 350 MG/ML SOLN COMPARISON:  11/19/2021, 12/02/2021 FINDINGS: Cardiovascular: This is a technically adequate evaluation of the pulmonary vasculature. No filling defects or pulmonary emboli. The heart is unremarkable without pericardial effusion. No evidence of thoracic aortic aneurysm or dissection. Stable atherosclerosis. 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 Right chest wall port via internal jugular approach, tip within the atriocaval junction. Mediastinum/Nodes: Status post thyroidectomy. Trachea and esophagus are unremarkable. Small hiatal hernia. No pathologic mediastinal, hilar, or axillary adenopathy. Lungs/Pleura: Left lower lobe consolidation in the posterior costophrenic angle obscures the known underlying nodule, with consolidation now measuring 2.7 x 1.6 cm image 121/5 not appreciably changed since prior exam. Other bilateral pulmonary nodules are again identified. Index nodules are as follows: Left lower lobe, image 126/5, 1.3 x 1.0 cm. Previously 1.1 x 0.9 cm. Right upper lobe, image 35/5, 1.0 x 0.7 cm. Previously 1.1 x 0.9 cm. There are multiple new subcentimeter ground-glass nodules throughout the bilateral upper lobes, concerning for progressive metastatic disease over underlying inflammation or infection. No effusion or pneumothorax.  Central  airways are patent. Upper Abdomen: There are multiple new and enlarging hypodense lesions throughout the liver parenchyma consistent with progressive metastatic disease. Index lesion within the left lobe liver image 132/4 measures 3.7 x 3.0 cm, previously 1.4 x 1.3 cm. Musculoskeletal: There are no acute or destructive bony lesions. Reconstructed images demonstrate no additional findings. Review of the MIP images confirms the above findings. IMPRESSION: 1. No evidence of pulmonary embolus. 2. Multiple pulmonary nodules as above, with interval enlargement of the left lower lobe nodule consistent with progression of disease. 3. Progressive hepatic metastatic disease. 4. Progressive bilateral subcentimeter ground-glass nodularity, greatest in the upper lobes, which could reflect progressive metastatic disease versus nonspecific inflammatory/infectious etiology. 5. Aortic Atherosclerosis (ICD10-I70.0). 6. Hiatal hernia. Electronically Signed   By: MRanda NgoM.D.   On: 12/26/2021 21:53   DG Chest 2 View  Result Date: 12/26/2021 CLINICAL DATA:  c/o fall 3-4 days ago, low back and rib pain since that time. Also endorses nausea since chemo treatments changed several days ago. EXAM: CHEST - 2 VIEW COMPARISON:  03/26/2019 FINDINGS: Interval placement of right IJ power port to the distal SVC. No pneumothorax. Patchy airspace infiltrate in the posterior left lower lobe, new since previous. Right lung clear. Heart size and mediastinal contours are within normal limits. No effusion. Visualized bones  unremarkable.  No pneumothorax. IMPRESSION: Patchy left lower lobe infiltrate, new since previous. Electronically Signed   By: Lucrezia Europe M.D.   On: 12/26/2021 19:15   US BIOPSY (LIVER)  Result Date: 12/11/2021 INDICATION: Lung cancer- new liver lesions EXAM: Ultrasound-guided liver lesion core needle biopsy MEDICATIONS: None. ANESTHESIA/SEDATION: Moderate (conscious) sedation was employed during this procedure. A total  of Versed 1.5 mg and Fentanyl 50 mcg was administered intravenously by the radiology nurse. Total intra-service moderate Sedation Time: 13 minutes. The patient's level of consciousness and vital signs were monitored continuously by radiology nursing throughout the procedure under my direct supervision. COMPLICATIONS: None immediate. PROCEDURE: Informed written consent was obtained from the patient after a thorough discussion of the procedural risks, benefits and alternatives. All questions were addressed. Maximal Sterile Barrier Technique was utilized including caps, mask, sterile gowns, sterile gloves, sterile drape, hand hygiene and skin antiseptic. A timeout was performed prior to the initiation of the procedure. The patient was placed supine on the exam table. Ultrasound of the liver demonstrated focal hypoechoic lesion in the left hepatic lobe, compatible with one of the masses identified on recent cross-sectional imaging. Skin entry site was marked, and the overlying skin was prepped draped in the standard sterile fashion. Local analgesia was obtained with 1% lidocaine. Using ultrasound guidance, a 17 gauge introducer needle was advanced towards the identified hypoechoic mass. Subsequently, core needle biopsy was performed of the hypoechoic mass in the left hepatic lobe using an 18 gauge core biopsy device x3 total passes. Specimens were submitted in formalin to pathology for further handling. Limited postprocedure imaging demonstrated no hematoma. A clean dressing was placed after manual hemostasis. The patient tolerated the procedure well without immediate complication. IMPRESSION: Successful ultrasound-guided core needle biopsy of focal lesion in the left hepatic lobe. Electronically Signed   By: Albin Felling M.D.   On: 12/11/2021 13:42   NM Bone Scan Whole Body  Result Date: 12/04/2021 CLINICAL DATA:  Non-small cell lung cancer. Recurrent lung cancer with hip pain. No history of fracture or recent  trauma. EXAM: NUCLEAR MEDICINE WHOLE BODY BONE SCAN TECHNIQUE: Whole body anterior and posterior images were obtained approximately 3 hours after intravenous injection of radiopharmaceutical. RADIOPHARMACEUTICALS:  19.7 mCi Technetium-46mMDP IV COMPARISON:  CT chest abdomen and pelvis 11/19/2021. Pelvis and hip radiographs 10/29/2021. PET-CT 07/23/2021 FINDINGS: Foci of mildly increased activity are demonstrated in the anterolateral left ninth rib, corresponding to an apparent old fracture on the recent CT. Another focus of mildly increased activity is demonstrated in the posterior right ninth rib at the costovertebral junction. No corresponding radiographic abnormality is demonstrated suggesting that this likely represents degenerative change. No definitive metastatic bone uptake is identified. IMPRESSION: 1. No scintigraphic evidence of bone metastasis. Electronically Signed   By: WLucienne CapersM.D.   On: 12/04/2021 20:03    Labs: Basic Metabolic Panel: Recent Labs  Lab 12/22/21 0939 12/26/21 2018 12/27/21 0114 12/28/21 0517  NA 139 139 142 139  K 3.4* 3.2* 3.3* 3.7  CL 103 107 111 110  CO2 _0 GLUCOSE 148* 77 93 121*  BUN _1 CREATININE 0.86 0.71 0.66 0.69  CALCIUM 9.0 9.1 8.6* 8.4*  MG  --   --  1.7  --   PHOS  --   --  3.6  --    CBC: Recent Labs  Lab 12/22/21 0939 12/26/21 2018 12/27/21 0114 12/28/21 0517 12/29/21 0438  WBC 8.9 31.4* 29.1* 18.8* 10.3  NEUTROABS 6.2 27.9* 25.2*  --   --   HGB 11.9* 12.0 11.2* 9.5* 9.6*  HCT 38.3 37.3 34.9* 29.4* 29.7*  MCV 103.2* 99.7 100.3* 99.7 100.0  PLT 254 197 180 136* 115*   Microbiology: Results for orders placed or performed during the hospital encounter of 12/26/21  Resp Panel by RT-PCR (Flu A&B, Covid) Anterior Nasal Swab     Status: None   Collection Time: 12/26/21  6:14 PM   Specimen: Anterior Nasal Swab  Result Value Ref Range Status   SARS Coronavirus 2 by RT PCR NEGATIVE NEGATIVE Final    Comment:  (NOTE) SARS-CoV-2 target nucleic acids are NOT DETECTED.  The SARS-CoV-2 RNA is generally detectable in upper respiratory specimens during the acute phase of infection. The lowest concentration of SARS-CoV-2 viral copies this assay can detect is 138 copies/mL. A negative result does not preclude SARS-Cov-2 infection and should not be used as the sole basis for treatment or other patient management decisions. A negative result may occur with  improper specimen collection/handling, submission of specimen other than nasopharyngeal swab, presence of viral mutation(s) within the areas targeted by this assay, and inadequate number of viral copies(<138 copies/mL). A negative result must be combined with clinical observations, patient history, and epidemiological information. The expected result is Negative.  Fact Sheet for Patients:  EntrepreneurPulse.com.au  Fact Sheet for Healthcare Providers:  IncredibleEmployment.be  This test is no t yet approved or cleared by the [place] FDA and  has been authorized for detection and/or diagnosis of SARS-CoV-2 by FDA under an Emergency Use Authorization (EUA). This EUA will remain  in effect (meaning this test can be used) for the duration of the COVID-19 declaration under Section 564(b)(1) of the Act, 21 U.S.C.section 360bbb-3(b)(1), unless the authorization is terminated  or revoked sooner.       Influenza A by PCR NEGATIVE NEGATIVE Final   Influenza B by PCR NEGATIVE NEGATIVE Final    Comment: (NOTE) The Xpert Xpress SARS-CoV-2/FLU/RSV plus assay is intended as an aid in the diagnosis of influenza from Nasopharyngeal swab specimens and should not be used as a sole basis for treatment. Nasal washings and aspirates are unacceptable for Xpert Xpress SARS-CoV-2/FLU/RSV testing.  Fact Sheet for Patients: EntrepreneurPulse.com.au  Fact Sheet for Healthcare  Providers: IncredibleEmployment.be  This test is not yet approved or cleared by the [place] FDA and has been authorized for detection and/or diagnosis of SARS-CoV-2 by FDA under an Emergency Use Authorization (EUA). This EUA will remain in effect (meaning this test can be used) for the duration of the COVID-19 declaration under Section 564(b)(1) of the Act, 21 U.S.C. section 360bbb-3(b)(1), unless the authorization is terminated or revoked.  Performed at [facility], [address] [place], [place] [address]   Blood Culture (routine x 2)     Status: None (Preliminary result)   Collection Time: 12/26/21  8:44 PM   Specimen: BLOOD  Result Value Ref Range Status   Specimen Description BLOOD BLOOD RIGHT HAND  Final   Special Requests   Final    Blood Culture results may not be optimal due to an inadequate volume of blood received in culture bottles   Culture   Final    NO GROWTH 3 DAYS Performed at [facility], [address] [place], [place] [address]    Report Status PENDING  Incomplete  Blood Culture (routine x 2)     Status: None (Preliminary result)   Collection Time: 12/26/21  8:49 PM  Specimen: BLOOD LEFT HAND  Result Value Ref Range Status   Specimen Description BLOOD LEFT HAND  Final   Special Requests   Final    Blood Culture results may not be optimal due to an inadequate volume of blood received in culture bottles   Culture   Final    NO GROWTH 3 DAYS Performed at [facility], [address] [place], [place] [address]    Report Status PENDING  Incomplete  Urine Culture     Status: Abnormal   Collection Time: 12/26/21 11:09 PM   Specimen: Urine, Clean Catch  Result Value Ref Range Status   Specimen Description   Final    URINE, CLEAN CATCH Performed at [facility], [address] [place], [place] [address]    Special Requests   Final    NONE Performed at [facility], [address] [place], [place] [address]    Culture (A)  Final    <10,000 COLONIES/mL INSIGNIFICANT GROWTH Performed at [facility], [place] [address] [place], [place] [address]    Report Status 12/28/2021 FINAL  Final  MRSA Next Gen by PCR, Nasal     Status: None   Collection Time: 12/27/21 10:41 AM   Specimen: Nasal Mucosa; Nasal Swab  Result Value Ref Range Status   MRSA by PCR Next Gen NOT DETECTED NOT DETECTED Final    Comment: (NOTE) The GeneXpert MRSA Assay (FDA approved for NASAL specimens only), is one component of a comprehensive MRSA colonization surveillance program. It is not intended to diagnose MRSA infection nor to guide or monitor treatment for MRSA infections. Test performance is not FDA approved in patients less than 46 years old. Performed at [facility], [address] [place],  [address]     Time coordinating discharge: Over 30 minutes  Richarda Osmond, MD  [facility] 12/29/2021, 8:34 AM

## 2021-12-30 LAB — MISC LABCORP TEST (SEND OUT): Labcorp test code: 83935

## 2021-12-31 ENCOUNTER — Encounter: Payer: Self-pay | Admitting: Internal Medicine

## 2021-12-31 LAB — CULTURE, BLOOD (ROUTINE X 2)
Culture: NO GROWTH
Culture: NO GROWTH

## 2022-01-01 ENCOUNTER — Other Ambulatory Visit: Payer: Self-pay | Admitting: *Deleted

## 2022-01-01 ENCOUNTER — Telehealth: Payer: Self-pay | Admitting: *Deleted

## 2022-01-01 MED ORDER — OXYCODONE HCL 5 MG PO TABS: 5.0000 mg | ORAL_TABLET | ORAL | 0 refills | Status: DC | PRN

## 2022-01-01 NOTE — Telephone Encounter (Signed)
Received message from Gabriel Cirri that pt stopped by and informed her that she needs help with a medication refill but did not mention which medication. Left message with patient to call back to inform us which medication she needs refilled. Awaiting call back.

## 2022-01-01 NOTE — Telephone Encounter (Signed)
Spoke with patient and new prescription for oxycodone sent into pharmacy per [place].

## 2022-01-11 ENCOUNTER — Other Ambulatory Visit: Payer: Self-pay | Admitting: Internal Medicine

## 2022-01-11 ENCOUNTER — Encounter: Payer: Self-pay | Admitting: Internal Medicine

## 2022-01-18 ENCOUNTER — Other Ambulatory Visit: Payer: Self-pay | Admitting: *Deleted

## 2022-01-18 ENCOUNTER — Telehealth: Payer: Self-pay

## 2022-01-18 MED ORDER — OXYCODONE HCL 5 MG PO TABS: 5.0000 mg | ORAL_TABLET | ORAL | 0 refills | Status: DC | PRN

## 2022-01-18 MED FILL — Dexamethasone Sodium Phosphate Inj 100 MG/10ML: INTRAMUSCULAR | Qty: 1 | Status: AC

## 2022-01-18 NOTE — Telephone Encounter (Signed)
215 pm.  Message received from patient needing to reschedule home visit.  Patient advised she is having cataract surgery that day.  Visit changed to 22 @ 11 am.

## 2022-01-19 ENCOUNTER — Other Ambulatory Visit: Payer: Self-pay

## 2022-01-19 ENCOUNTER — Inpatient Hospital Stay: Payer: 59

## 2022-01-19 ENCOUNTER — Inpatient Hospital Stay: Payer: 59 | Admitting: Oncology

## 2022-01-19 ENCOUNTER — Inpatient Hospital Stay (HOSPITAL_BASED_OUTPATIENT_CLINIC_OR_DEPARTMENT_OTHER): Payer: 59 | Admitting: Hospice and Palliative Medicine

## 2022-01-19 ENCOUNTER — Inpatient Hospital Stay: Payer: 59 | Attending: Internal Medicine

## 2022-01-19 ENCOUNTER — Inpatient Hospital Stay (HOSPITAL_BASED_OUTPATIENT_CLINIC_OR_DEPARTMENT_OTHER): Payer: 59 | Admitting: Internal Medicine

## 2022-01-19 VITALS — BP 106/76 | HR 85 | Temp 98.5°F | Resp 16 | Ht 67.0 in | Wt 173.0 lb

## 2022-01-19 DIAGNOSIS — Z79899 Other long term (current) drug therapy: Secondary | ICD-10-CM | POA: Insufficient documentation

## 2022-01-19 DIAGNOSIS — C3432 Malignant neoplasm of lower lobe, left bronchus or lung: Secondary | ICD-10-CM

## 2022-01-19 DIAGNOSIS — J449 Chronic obstructive pulmonary disease, unspecified: Secondary | ICD-10-CM | POA: Insufficient documentation

## 2022-01-19 DIAGNOSIS — C787 Secondary malignant neoplasm of liver and intrahepatic bile duct: Secondary | ICD-10-CM | POA: Insufficient documentation

## 2022-01-19 DIAGNOSIS — Z87891 Personal history of nicotine dependence: Secondary | ICD-10-CM | POA: Insufficient documentation

## 2022-01-19 DIAGNOSIS — G893 Neoplasm related pain (acute) (chronic): Secondary | ICD-10-CM | POA: Diagnosis not present

## 2022-01-19 DIAGNOSIS — Z5112 Encounter for antineoplastic immunotherapy: Secondary | ICD-10-CM | POA: Insufficient documentation

## 2022-01-19 DIAGNOSIS — C782 Secondary malignant neoplasm of pleura: Secondary | ICD-10-CM | POA: Diagnosis not present

## 2022-01-19 DIAGNOSIS — Z7984 Long term (current) use of oral hypoglycemic drugs: Secondary | ICD-10-CM | POA: Diagnosis not present

## 2022-01-19 DIAGNOSIS — M5431 Sciatica, right side: Secondary | ICD-10-CM | POA: Diagnosis not present

## 2022-01-19 DIAGNOSIS — Z515 Encounter for palliative care: Secondary | ICD-10-CM | POA: Diagnosis not present

## 2022-01-19 DIAGNOSIS — E1136 Type 2 diabetes mellitus with diabetic cataract: Secondary | ICD-10-CM | POA: Insufficient documentation

## 2022-01-19 LAB — COMPREHENSIVE METABOLIC PANEL
ALT: 18 U/L (ref 0–44)
AST: 27 U/L (ref 15–41)
Albumin: 3.5 g/dL (ref 3.5–5.0)
Alkaline Phosphatase: 88 U/L (ref 38–126)
Anion gap: 11 (ref 5–15)
BUN: 9 mg/dL (ref 8–23)
CO2: 27 mmol/L (ref 22–32)
Calcium: 8.7 mg/dL — ABNORMAL LOW (ref 8.9–10.3)
Chloride: 103 mmol/L (ref 98–111)
Creatinine, Ser: 0.78 mg/dL (ref 0.44–1.00)
GFR, Estimated: >60 mL/min (ref >60)
Glucose, Bld: 155 mg/dL — ABNORMAL HIGH (ref 70–99)
Potassium: 3.5 mmol/L (ref 3.5–5.1)
Sodium: 141 mmol/L (ref 135–145)
Total Bilirubin: 0.7 mg/dL (ref 0.3–1.2)
Total Protein: 6.8 g/dL (ref 6.5–8.1)

## 2022-01-19 LAB — CBC WITH DIFFERENTIAL/PLATELET
Abs Immature Granulocytes: 0.07 10*3/uL (ref 0.00–0.07)
Basophils Absolute: 0.1 10*3/uL (ref 0.0–0.1)
Basophils Relative: 1 %
Eosinophils Absolute: 0 10*3/uL (ref 0.0–0.5)
Eosinophils Relative: 0 %
HCT: 35.5 % — ABNORMAL LOW (ref 36.0–46.0)
Hemoglobin: 10.9 g/dL — ABNORMAL LOW (ref 12.0–15.0)
Immature Granulocytes: 1 %
Lymphocytes Relative: 10 %
Lymphs Abs: 1 10*3/uL (ref 0.7–4.0)
MCH: 31.7 pg (ref 26.0–34.0)
MCHC: 30.7 g/dL (ref 30.0–36.0)
MCV: 103.2 fL — ABNORMAL HIGH (ref 80.0–100.0)
Monocytes Absolute: 1.3 10*3/uL — ABNORMAL HIGH (ref 0.1–1.0)
Monocytes Relative: 12 %
Neutro Abs: 8.2 10*3/uL — ABNORMAL HIGH (ref 1.7–7.7)
Neutrophils Relative %: 76 %
Platelets: 304 10*3/uL (ref 150–400)
RBC: 3.44 MIL/uL — ABNORMAL LOW (ref 3.87–5.11)
RDW: 18.2 % — ABNORMAL HIGH (ref 11.5–15.5)
WBC: 10.7 10*3/uL — ABNORMAL HIGH (ref 4.0–10.5)
nRBC: 0 % (ref 0.0–0.2)

## 2022-01-19 LAB — AMYLASE: Amylase: 104 U/L — ABNORMAL HIGH (ref 28–100)

## 2022-01-19 LAB — LIPASE, BLOOD: Lipase: 50 U/L (ref 11–51)

## 2022-01-19 MED ORDER — SODIUM CHLORIDE 0.9 % IV SOLN
Freq: Once | INTRAVENOUS | Status: AC
  Filled 2022-01-19: qty 250

## 2022-01-19 MED ORDER — SODIUM CHLORIDE 0.9 % IV SOLN
576.0000 mg | Freq: Once | INTRAVENOUS | Status: AC
  Administered 2022-01-19: 580 mg via INTRAVENOUS
  Filled 2022-01-19: qty 58

## 2022-01-19 MED ORDER — SODIUM CHLORIDE 0.9 % IV SOLN
10.0000 mg | Freq: Once | INTRAVENOUS | Status: AC
  Administered 2022-01-19: 10 mg via INTRAVENOUS
  Filled 2022-01-19: qty 10

## 2022-01-19 MED ORDER — SODIUM CHLORIDE 0.9 % IV SOLN
150.0000 mg | Freq: Once | INTRAVENOUS | Status: AC
  Administered 2022-01-19: 150 mg via INTRAVENOUS
  Filled 2022-01-19: qty 150

## 2022-01-19 MED ORDER — SODIUM CHLORIDE 0.9 % IV SOLN
1200.0000 mg | Freq: Once | INTRAVENOUS | Status: AC
  Administered 2022-01-19: 1200 mg via INTRAVENOUS
  Filled 2022-01-19: qty 20

## 2022-01-19 MED ORDER — HEPARIN SOD (PORK) LOCK FLUSH 100 UNIT/ML IV SOLN
INTRAVENOUS | Status: AC
  Administered 2022-01-19: 500 [IU]
  Filled 2022-01-19: qty 5

## 2022-01-19 MED ORDER — OXYCODONE HCL 5 MG PO TABS
5.0000 mg | ORAL_TABLET | Freq: Once | ORAL | Status: AC
  Administered 2022-01-19: 5 mg via ORAL
  Filled 2022-01-19: qty 1

## 2022-01-19 MED ORDER — SODIUM CHLORIDE 0.9 % IV SOLN
80.0000 mg/m2 | Freq: Once | INTRAVENOUS | Status: AC
  Administered 2022-01-19: 160 mg via INTRAVENOUS
  Filled 2022-01-19: qty 8

## 2022-01-19 MED ORDER — PALONOSETRON HCL INJECTION 0.25 MG/5ML
0.2500 mg | Freq: Once | INTRAVENOUS | Status: AC
  Administered 2022-01-19: 0.25 mg via INTRAVENOUS
  Filled 2022-01-19: qty 5

## 2022-01-19 MED ORDER — HEPARIN SOD (PORK) LOCK FLUSH 100 UNIT/ML IV SOLN
500.0000 [IU] | Freq: Once | INTRAVENOUS | Status: AC | PRN
  Filled 2022-01-19: qty 5

## 2022-01-19 MED FILL — Dexamethasone Sodium Phosphate Inj 100 MG/10ML: INTRAMUSCULAR | Qty: 1 | Status: AC

## 2022-01-19 NOTE — Assessment & Plan Note (Addendum)
#  Stage IV -SMALL CELL lung cancer [s/p liver Bx-; Previously on-small cell favor adenocarcinoma]-currently on carboplatin etoposide plus Tecentriq every 3 weeks.   # Proceed with cycle #2 carbo etoposide plus Tecentriq chemotherapy. [Dose reduction- by 20%-recent admission to hospital]. Labs today reviewed;  acceptable for treatment today.   # Bilateral rib pain- bone scan- OCT 2023- No scintigraphic evidence of bone metastasis.  ? Sec to liver disease;  continue oxycodone;Right sciatica/ radiates down the leg- continue neurontin 100 mg  BID.    # COPD [Dr.A]/allergies likely secondary to poorly controlled COPD. Continue using albuterol 3-4 times a day and also compliance with Advair.  continue Singulair.  STABLE;  # Right eye vision changes-3.6 mm lesion noted in the right eye;s/p ophthalmology evaluation at [facility]. 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 JUlY 2023- s/p revaluation with [facility] opth end of month- STABLE.   # Diabetes-A1c 6.9 [AUG 2022]-PBF- 164- Continue glipizide 5 mg BID STABLE;    # Cough secondary to radiation-currently on Tessalon Perles.STABLE;   # Hypocalcemia: on Vit D BID [8.6; ca-kidney stones].  JUNE  VitD levels-48.  STABLE;    # Mediport placement-s/p dye study c tPA-functioning. STABLE;   # Recent hospitalization for dehydration-preemptively have the patient follow-up with APP in 1 week postchemotherapy.   Dec 19th- cataract #2.   #DISPOSITION: # add lipase/amylase to labs today # chemo today- this week. # follow up in 1 week- APP;port/ labs- cbc/cmp; possible IVFs over 1 hour # follow up in 3 weeks- /port-labs- cbc/cmp; carbo plus etoposide; etop- days 2 &3; D-4 Ellen Henri. Dr.B

## 2022-01-19 NOTE — Progress Notes (Signed)
[place] CONSULT NOTE  Patient Care Team: Dion Body, MD as PCP - General (Family Medicine) Leata Mouse, PA-C Byrnett, Forest Gleason, MD (General Surgery) Telford Nab, RN as Oncology Nurse Navigator Cammie Sickle, MD as Consulting Physician (Oncology)  CHIEF COMPLAINTS/PURPOSE OF CONSULTATION: lung cancer  Oncology History Overview Note  IMPRESSION: Hypermetabolic solid pulmonary nodule of the left lower lobe, favor primary lung malignancy.   Numerous hypermetabolic nodules of the lower left pleura, compatible with pleural metastatic disease.   Hypermetabolic subcarinal and left hilar lymph nodes, compatible with metastatic disease.   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 Recommend attention on follow-up.   Mild asymmetric FDG uptake below mediastinal blood pool within a small nodule of the left parotid gland, favored to be benign. Recommend attention on follow-up.   No evidence of metastatic disease in the abdomen or pelvis.   No evidence of osseous metastatic disease.   ---------------------  # MARCH 2022- incidental LLL [ 0.9 cm] s/p COVID vaccine; [Dr.Aleskerov]; July 2022- left pleural pain-   #  #October 2022 MRI brain-punctate lesion-asymptomatic [D/w- Dr.Vaslow]; December 2022 MRI negative for any acute process; chronic sclerosis of hippocampus-clinically insignificant.  COPD-non-complaint    ------------------   DIAGNOSIS:  A. PLEURAL MASS, LEFT CHEST; BIOPSY:  - DIAGNOSTIC OF MALIGNANCY.  - NON-SMALL CELL CARCINOMA, FAVOR ADENOCARCINOMA.   Comment:  The tumor cells are positive for CK7 and TTF1 (patchy).  They are  negative for p40.   # 12/10/2020-carbo Alimta; cycle #1.    # JUNE 8th, 2023-  Enlarging left lower  lobe pulmonary nodule seen on recent CT scan is markedly hypermetabolic, consistent with neoplasm; Hypermetabolic nodal metastatic disease in the left hilum and central mediastinum. JUNE- 2023- RT 20 Fractions.  # NGS PD-L1 TPS 1% ------------------------ OCT 2023- liver Biopsy-positive for "small cell"/high-grade neuroendocrine-   # ? NOV 14th, 2023-  carbo etoposide-Tecentriq every 3 weeks x 4 cycles.     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HCC) (Resolved)  05/02/2015 Initial Diagnosis   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[place])   Cancer of lower lobe of left lung ([place])  12/02/2020 Initial Diagnosis   Cancer of lower lobe of left lung ([place])   12/10/2020 Cancer Staging   Staging form: Lung, AJCC 8th Edition - Clinical: Stage IVA (cT4, cN3, cM1a) - Signed by Cammie Sickle, MD on 12/10/2020   12/10/2020 - 09/16/2021 Chemotherapy   Patient is on Treatment Plan : LUNG CARBOplatin / Pemetrexed / Pembrolizumab q21d Induction x 4 cycles / Maintenance Pemetrexed + Pembrolizumab     12/10/2020 - 11/18/2021 Chemotherapy   Patient is on Treatment Plan : LUNG Carboplatin (5) + Pemetrexed (500) + Pembrolizumab (200) D1 q21d Induction x 4 cycles / Maintenance Pemetrexed (500) + Pembrolizumab (200) D1 q21d     12/22/2021 -  Chemotherapy   Patient is on Treatment Plan : LUNG SCLC Carboplatin + Etoposide + Atezolizumab Induction q21d x 4 cycles / Atezolizumab Maintenance q21d        HISTORY OF PRESENTING ILLNESS: Ambulating independently.  Accompanied by her sister.   Felicia Manning 66 y.o.  female history of smoking-metastatic adeno ca lung cancer currently transformed/SMALL CELL LUNG CA [s/p liver biopsy] chemoimmunotherapy is here for follow-up.  Patient currently s/p cycle #1 of chemotherapy approximately 4 weeks ago.  Patient was  admitted to the hospital after cycle 1-because of extreme fatigue/presyncopal episodes-possible UTI.  Patient was treated with antibiotics IV fluids and  discharged home.  Patient lost hair since starting new treatment.  Patient complaining of ongoing bilateral rib pain radiating to back, 3/10 pain scale.  Taking Oxycodone that does relieve the pain for 4 hours.  Patient's right hip pain stable improved with neurology.  Mild diarrhea- mild nausea currently improved with antiemetics.  No weight loss.   Review of Systems  Constitutional:  Positive for malaise/fatigue. Negative for chills, diaphoresis, fever and weight loss.  HENT:  Negative for nosebleeds and sore throat.   Eyes:  Negative for double vision.  Respiratory:  Positive for cough and sputum production. Negative for hemoptysis and shortness of breath.   Cardiovascular:  Negative for palpitations, orthopnea and leg swelling.  Gastrointestinal:  Negative for abdominal pain, blood in stool, constipation, diarrhea, heartburn, melena, nausea and vomiting.  Genitourinary:  Negative for dysuria, frequency and urgency.  Musculoskeletal:  Negative for back pain and joint pain.  Skin: Negative.  Negative for itching and rash.  Neurological:  Negative for dizziness, tingling, focal weakness and weakness.  Endo/Heme/Allergies:  Does not bruise/bleed easily.  Psychiatric/Behavioral:  Negative for depression. The patient is not nervous/anxious and does not have insomnia.      MEDICAL HISTORY:  Past Medical History:  Diagnosis Date   Barrett's esophagus    COPD (chronic obstructive pulmonary disease) ([place])    Dysrhythmia    stress related at work.   GERD (gastroesophageal reflux disease)    H/O malignant neoplasm of thyroid 01/29/2014   Hepatitis C 2008   [place] 2017   History of kidney stones    Hyperlipidemia    Hypertension    Hypothyroidism    Liver cancer ([place])    metastasized from Lung   Nephrolithiasis    Non-small cell carcinoma of left lung ([place])    Osteoporosis    Pulmonary mass    Thyroid cancer ([place]) 2012   Thyroid cancer ([place])    Tumor    tumor per pt back of rt  eye   Type 2 diabetes mellitus ([place])    Wears dentures    Full upper and lower.  Usually only wears upper    SURGICAL HISTORY: Past Surgical History:  Procedure Laterality Date   ABDOMINAL HYSTERECTOMY  2006   CATARACT EXTRACTION W/PHACO Right 12/08/2021   Procedure: CATARACT EXTRACTION PHACO AND INTRAOCULAR LENS PLACEMENT (IOC) RIGHT DIABETIC 6.62 00:35.1;  Surgeon: Birder Robson, MD;  Location: [facility];  Service: Ophthalmology;  Laterality: Right;  Diabetic   COLONOSCOPY WITH PROPOFOL N/A 04/25/2015   Procedure: COLONOSCOPY WITH PROPOFOL;  Surgeon: Josefine Class, MD;  Location[facility] ENDOSCOPY;  Service: Endoscopy;  Laterality: N/A;   CYSTOSCOPY W/ RETROGRADES Bilateral 05/19/2015   Procedure: CYSTOSCOPY WITH RETROGRADE PYELOGRAM;  Surgeon: Hollice Espy, MD;  Location: [facility];  Service: Urology;  Laterality: Bilateral;   CYSTOSCOPY W/ URETERAL STENT PLACEMENT Right 05/26/2015   Procedure: CYSTOSCOPY WITH STENT REPLACEMENT;  Surgeon: Hollice Espy, MD;  Location: [facility];  Service: Urology;  Laterality: Right;   CYSTOSCOPY WITH STENT PLACEMENT Right 05/19/2015   Procedure: CYSTOSCOPY WITH STENT PLACEMENT;  Surgeon: Hollice Espy, MD;  Location: [facility];  Service: Urology;  Laterality: Right;   CYSTOSCOPY WITH STENT PLACEMENT Right 05/26/2015   Procedure: CYSTOSCOPY WITH STENT PLACEMENT;  Surgeon: Hollice Espy, MD;  Location: [facility];  Service: Urology;  Laterality: Right;   CYSTOSCOPY WITH STENT PLACEMENT Right  06/25/2016   Procedure: CYSTOSCOPY WITH STENT PLACEMENT;  Surgeon: Nickie Retort, MD;  Location: [facility];  Service: Urology;  Laterality: Right;   CYSTOSCOPY WITH STENT PLACEMENT Left 10/31/2017   Procedure: CYSTOSCOPY WITH STENT PLACEMENT;  Surgeon: Hollice Espy, MD;  Location: [facility];  Service: Urology;  Laterality: Left;   CYSTOSCOPY/URETEROSCOPY/HOLMIUM LASER/STENT PLACEMENT Left 07/23/2015   Procedure: CYSTOSCOPY/URETEROSCOPY/HOLMIUM  LASER/STENT PLACEMENT/RETROGRADE PYELOGRAM;  Surgeon: Hollice Espy, MD;  Location: [facility];  Service: Urology;  Laterality: Left;   CYSTOSCOPY/URETEROSCOPY/HOLMIUM LASER/STENT PLACEMENT Left 11/16/2017   Procedure: CYSTOSCOPY/URETEROSCOPY/HOLMIUM LASER/STENT Exchange;  Surgeon: Hollice Espy, MD;  Location: [facility];  Service: Urology;  Laterality: Left;   ESOPHAGOGASTRODUODENOSCOPY (EGD) WITH PROPOFOL N/A 04/25/2015   Procedure: ESOPHAGOGASTRODUODENOSCOPY (EGD) WITH PROPOFOL;  Surgeon: Josefine Class, MD;  Location: [facility] ENDOSCOPY;  Service: Endoscopy;  Laterality: N/A;   EYE SURGERY Bilateral 1964   lazy eye repair   EYE SURGERY Bilateral 2001   lazy eye repair   IR IMAGING GUIDED PORT INSERTION  12/08/2020   LITHOTRIPSY  2009   THYROIDECTOMY  2010   URETEROSCOPY WITH HOLMIUM LASER LITHOTRIPSY Right 05/19/2015   Procedure: URETEROSCOPY WITH HOLMIUM LASER LITHOTRIPSY;  Surgeon: Hollice Espy, MD;  Location: [facility];  Service: Urology;  Laterality: Right;   URETEROSCOPY WITH HOLMIUM LASER LITHOTRIPSY Right 05/26/2015   Procedure: URETEROSCOPY WITH HOLMIUM LASER LITHOTRIPSY;  Surgeon: Hollice Espy, MD;  Location: [facility];  Service: Urology;  Laterality: Right;   URETEROSCOPY WITH HOLMIUM LASER LITHOTRIPSY Right 06/25/2016   Procedure: URETEROSCOPY WITH HOLMIUM LASER LITHOTRIPSY;  Surgeon: Nickie Retort, MD;  Location: [facility];  Service: Urology;  Laterality: Right;    SOCIAL HISTORY: Social History   Socioeconomic History   Marital status: Divorced    Spouse name: Not on file   Number of children: Not on file   Years of education: Not on file   Highest education level: Not on file  Occupational History   Not on file  Tobacco Use   Smoking status: Former    Packs/day: 1.00    Years: 50.00    Total pack years: 50.00    Types: Cigarettes    Quit date: 08/15/2021    Years since quitting: 0.4   Smokeless tobacco: Never   Tobacco comments:    Started smoking age 68   Vaping Use   Vaping Use: Former  Substance and Sexual Activity   Alcohol use: Not Currently    Comment: socially   Drug use: No   Sexual activity: Not Currently    Birth control/protection: None  Other Topics Concern   Not on file  Social History Narrative   Smoking: 1p 1-2 days since 17 years; alcohol: rare; 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     Social Determinants of Health   Financial Resource Strain: Not on file  Food Insecurity: No Food Insecurity (12/27/2021)   Hunger Vital Sign    Worried About Running Out of Food in the Last Year: Never true    Ran Out of Food in the Last Year: Never true  Transportation Needs: No Transportation Needs (12/27/2021)   PRAPARE - Hydrologist (Medical): No    Lack of Transportation (Non-Medical): No  Recent Concern: Transportation Needs - Unmet Transportation Needs (10/02/2021)   PRAPARE - Hydrologist (Medical): Yes    Lack of Transportation (Non-Medical): Yes  Physical Activity: Not on file  Stress: Not  on file  Social Connections: Not on file  Intimate Partner Violence: Not At Risk (12/27/2021)   Humiliation, Afraid, Rape, and Kick questionnaire    Fear of Current or Ex-Partner: No    Emotionally Abused: No    Physically Abused: No    Sexually Abused: No    FAMILY HISTORY: Family History  Problem Relation Age of Onset   CAD Mother    Heart disease Mother    Dementia Father    Bladder Cancer Neg Hx    Prostate cancer Neg Hx    Kidney cancer Neg Hx    Breast cancer Neg Hx     ALLERGIES:  has No Known Allergies.  MEDICATIONS:  Current Outpatient Medications  Medication Sig Dispense Refill   Accu-Chek Softclix Lancets lancets Check glucose once a day 100 each 12   albuterol (VENTOLIN HFA) 108 (90 Base) MCG/ACT inhaler Inhale 2 puffs into the lungs every 6 (six) hours as needed for wheezing  or shortness of breath. 8 g 2   ARTIFICIAL TEAR OP Apply to eye.     benzonatate (TESSALON PERLES) 100 MG capsule Take 1 capsule (100 mg total) by mouth 3 (three) times daily as needed for cough. 90 capsule 1   blood glucose meter kit and supplies KIT Dispense based on patient and insurance preference. Use up to three times daily as directed. 1 each 0   buPROPion (WELLBUTRIN SR) 150 MG 12 hr tablet Take 150 mg by mouth daily.     cholecalciferol (VITAMIN D) 1000 units tablet Take 2,000 Units by mouth daily.     Cranberry-Vitamin C-Probiotic (AZO CRANBERRY PO) Take 1 tablet by mouth daily.     fluticasone-salmeterol (ADVAIR HFA) 45-21 MCG/ACT inhaler Inhale 2 puffs into the lungs 2 (two) times daily. 1 each 12   folic acid (FOLVITE) 1 MG tablet Take 1 tablet (1 mg total) by mouth daily. 90 tablet 1   gabapentin (NEURONTIN) 100 MG capsule Take 1 capsule (100 mg total) by mouth 2 (two) times daily. (Patient taking differently: Take 300 mg by mouth at bedtime.) 30 capsule 2   glipiZIDE (GLUCOTROL) 5 MG tablet Take 1 tablet (5 mg total) by mouth 2 (two) times daily before a meal. 180 tablet 1   levothyroxine (SYNTHROID) 175 MCG tablet Take 1 tablet (175 mcg total) by mouth daily before breakfast. 90 tablet 0   lidocaine-prilocaine (EMLA) cream Apply 1 application topically as needed. 30 g 0   loperamide (IMODIUM) 2 MG capsule Take by mouth as needed for diarrhea or loose stools.     metoprolol succinate (TOPROL-XL) 25 MG 24 hr tablet Take 25 mg by mouth every morning.   1   montelukast (SINGULAIR) 10 MG tablet TAKE 1 TABLET BY MOUTH AT BEDTIME 30 tablet 0   omeprazole (PRILOSEC) 20 MG capsule Take 20 mg by mouth daily before breakfast.      ondansetron (ZOFRAN) 8 MG tablet Take 1 tablet (8 mg total) by mouth every 8 (eight) hours as needed for nausea or vomiting. Start on the third day after chemotherapy 30 tablet 2   oxyCODONE (OXY IR/ROXICODONE) 5 MG immediate release tablet Take 1 tablet (5 mg total)  by mouth every 4 (four) hours as needed for severe pain. 60 tablet 0   prochlorperazine (COMPAZINE) 10 MG tablet Take 1 tablet (10 mg total) by mouth every 6 (six) hours as needed for nausea or vomiting. 30 tablet 2   losartan (COZAAR) 100 MG tablet Take 100 mg by mouth  every morning.      nystatin (MYCOSTATIN) 100000 UNIT/ML suspension Swish and swallow. Don't drink fluids immediately following administration. (Patient not taking: Reported on 01/19/2022) 400 mL 0   sucralfate (CARAFATE) 1 g tablet Take 1 tablet (1 g total) by mouth 4 (four) times daily -  with meals and at bedtime. 60 tablet 2   No current facility-administered medications for this visit.   Facility-Administered Medications Ordered in Other Visits  Medication Dose Route Frequency Provider Last Rate Last Admin   CARBOplatin (PARAPLATIN) 580 mg in sodium chloride 0.9 % 250 mL chemo infusion  580 mg Intravenous Once Lloyd Huger, MD       etoposide (VEPESID) 160 mg in sodium chloride 0.9 % 500 mL chemo infusion  80 mg/m2 (Treatment Plan Recorded) Intravenous Once Cammie Sickle, MD 508 mL/hr at 01/19/22 1204 160 mg at 01/19/22 1204   heparin lock flush 100 UNIT/ML injection            heparin lock flush 100 UNIT/ML injection            heparin lock flush 100 unit/mL  500 Units Intracatheter Once PRN Lloyd Huger, MD          .  PHYSICAL EXAMINATION: ECOG PERFORMANCE STATUS: 1 - Symptomatic but completely ambulatory  Vitals:   01/19/22 0900  BP: 106/76  Pulse: 85  Resp: 16  Temp: 98.5 F (36.9 C)     Filed Weights   01/19/22 0900  Weight: 173 lb (78.5 kg)    No significant right upper quadrant tenderness noted.  Physical Exam Vitals and nursing note reviewed.  HENT:     Head: Normocephalic and atraumatic.     Mouth/Throat:     Pharynx: Oropharynx is clear.  Eyes:     Extraocular Movements: Extraocular movements intact.     Pupils: Pupils are equal, round, and reactive to light.   Cardiovascular:     Rate and Rhythm: Normal rate and regular rhythm.  Pulmonary:     Comments: Decreased breath sounds bilaterally.  Abdominal:     Palpations: Abdomen is soft.  Musculoskeletal:        General: Normal range of motion.     Cervical back: Normal range of motion.  Skin:    General: Skin is warm.  Neurological:     General: No focal deficit present.     Mental Status: She is alert and oriented to person, place, and time.  Psychiatric:        Behavior: Behavior normal.        Judgment: Judgment normal.      LABORATORY DATA:  I have reviewed the data as listed Lab Results  Component Value Date   WBC 10.7 (H) 01/19/2022   HGB 10.9 (L) 01/19/2022   HCT 35.5 (L) 01/19/2022   MCV 103.2 (H) 01/19/2022   PLT 304 01/19/2022   Recent Labs    12/27/21 0114 12/28/21 0517 01/19/22 0843  NA 142 139 141  K 3.3* 3.7 3.5  CL 111 110 103  CO2 _0 GLUCOSE 93 121* 155*  BUN _1 CREATININE 0.66 0.69 0.78  CALCIUM 8.6* 8.4* 8.7*  GFRNONAA >60 >60 >60  PROT 6.3* 5.6* 6.8  ALBUMIN 3.2* 2.8* 3.5  AST 26 65* 27  ALT 16 43 18  ALKPHOS 76 87 88  BILITOT 0.8 0.7 0.7    RADIOGRAPHIC STUDIES: I have personally reviewed the radiological images as listed and agreed with the  findings in the report. CT Angio Chest PE W/Cm &/Or Wo Cm  Result Date: 12/26/2021 CLINICAL DATA:  Metastatic left lower lobe non-small cell lung cancer, dizziness, cough, left posterior rib pain EXAM: CT ANGIOGRAPHY CHEST WITH CONTRAST TECHNIQUE: Multidetector CT imaging of the chest was performed using the standard protocol during bolus administration of intravenous contrast. Multiplanar CT image reconstructions and MIPs were obtained to evaluate the vascular anatomy. RADIATION DOSE REDUCTION: This exam was performed according to the departmental dose-optimization program which includes automated exposure control, adjustment of the mA and/or kV according to patient size and/or use of iterative  reconstruction technique. CONTRAST:  25m OMNIPAQUE IOHEXOL 350 MG/ML SOLN COMPARISON:  11/19/2021, 12/02/2021 FINDINGS: Cardiovascular: This is a technically adequate evaluation of the pulmonary vasculature. No filling defects or pulmonary emboli. The heart is unremarkable without pericardial effusion. No evidence of thoracic aortic aneurysm or dissection. Stable atherosclerosis. 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 Right chest wall port via internal jugular approach, tip within the atriocaval junction. Mediastinum/Nodes: Status post thyroidectomy. Trachea and esophagus are unremarkable. Small hiatal hernia. No pathologic mediastinal, hilar, or axillary adenopathy. Lungs/Pleura: Left lower lobe consolidation in the posterior costophrenic angle obscures the known underlying nodule, with consolidation now measuring 2.7 x 1.6 cm image 121/5 not appreciably changed since prior exam. Other bilateral pulmonary nodules are again identified. Index nodules are as follows: Left lower lobe, image 126/5, 1.3 x 1.0 cm. Previously 1.1 x 0.9 cm. Right upper lobe, image 35/5, 1.0 x 0.7 cm. Previously 1.1 x 0.9 cm. There are multiple new subcentimeter ground-glass nodules throughout the bilateral upper lobes, concerning for progressive metastatic disease over underlying inflammation or infection. No effusion or pneumothorax.  Central airways are patent. Upper Abdomen: There are multiple new and enlarging hypodense lesions throughout the liver parenchyma consistent with progressive metastatic disease. Index lesion within the left lobe liver image 132/4 measures 3.7 x 3.0 cm, previously 1.4 x 1.3 cm. Musculoskeletal: There are no acute or destructive bony lesions. Reconstructed images demonstrate no additional findings. Review of the MIP images confirms the above findings. IMPRESSION: 1. No evidence of pulmonary embolus. 2. Multiple pulmonary nodules as above, with interval  enlargement of the left lower lobe nodule consistent with progression of disease. 3. Progressive hepatic metastatic disease. 4. Progressive bilateral subcentimeter ground-glass nodularity, greatest in the upper lobes, which could reflect progressive metastatic disease versus nonspecific inflammatory/infectious etiology. 5. Aortic Atherosclerosis (ICD10-I70.0). 6. Hiatal hernia. Electronically Signed   By: MRanda NgoM.D.   On: 12/26/2021 21:53   DG Chest 2 View  Result Date: 12/26/2021 CLINICAL DATA:  c/o fall 3-4 days ago, low back and rib pain since that time. Also endorses nausea since chemo treatments changed several days ago. EXAM: CHEST - 2 VIEW COMPARISON:  03/26/2019 FINDINGS: Interval placement of right IJ power port to the distal SVC. No pneumothorax. Patchy airspace infiltrate in the posterior left lower lobe, new since previous. Right lung clear. Heart size and mediastinal contours are within normal limits. No effusion. Visualized bones unremarkable.  No pneumothorax. IMPRESSION: Patchy left lower lobe infiltrate, new since previous. Electronically Signed   By: DLucrezia EuropeM.D.   On: 12/26/2021 19:15    ASSESSMENT & PLAN:   Can[place] lobe of [place] lung [place] #Stage IV -SMALL CELL lung cancer [s/p liver Bx-; Previously on-small cell favor adenocarcinoma]-currently on carboplatin etoposide plus Tecentriq every 3 weeks.   # Proceed with cycle #2 carbo etoposide plus Tecentriq  chemotherapy. [Dose reduction- by 20%-recent admission to hospital]. Labs today reviewed;  acceptable for treatment today.   # Bilateral rib pain- bone scan- OCT 2023- No scintigraphic evidence of bone metastasis.  ? Sec to liver disease;  continue oxycodone;Right sciatica/ radiates down the leg- continue neurontin 100 mg  BID.    # COPD [Dr.A]/allergies likely secondary to poorly controlled COPD. Continue using albuterol 3-4 times a day and also compliance with Advair.  continue Singulair.  STABLE;  # Right  eye vision changes-3.6 mm lesion noted in the right eye;s/p ophthalmology evaluation at [facility]. 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 JUlY 2023- s/p revaluation with [facility] opth end of month- STABLE.   # Diabetes-A1c 6.9 [AUG 2022]-PBF- 164- Continue glipizide 5 mg BID STABLE;    # Cough secondary to radiation-currently on Tessalon Perles.STABLE;   # Hypocalcemia: on Vit D BID [8.6; ca-kidney stones].  JUNE  VitD levels-48.  STABLE;    # Mediport placement-s/p dye study c tPA-functioning. STABLE;   Dec 19th- cataract #2.   #DISPOSITION: # add lipase/amylase to labs today # chemo today- this week. # follow up in 1 week- APP;port/ labs- cbc/cmp; possible IVFs over 1 hour # follow up in 3 weeks- /port-labs- cbc/cmp; carbo plus etoposide; etop- days 2 &3; D-4 Ellen Henri. Dr.B    All questions were answered. The patient knows to call the clinic with any problems, questions or concerns.       Cammie Sickle, MD 01/19/2022 12:38 PM

## 2022-01-19 NOTE — Progress Notes (Signed)
[place] [facility] [phone] [phone]   Name: Kentrell Guettler Date: 01/19/2022 MRN: [id number]  DOB: [birth year]  Patient Care Team: Dion Body, MD as PCP - General (Family Medicine) Leata Mouse, PA-C Byrnett, Forest Gleason, MD (General Surgery) Telford Nab, RN as Oncology Nurse Navigator Cammie Sickle, MD as Consulting Physician (Oncology)    REASON FOR CONSULTATION: Felicia Manning is a 66 y.o. female with multiple medical problems including including COPD, diabetes, and stage IV non-small cell lung cancer, initially diagnosed October 2022, on chemotherapy/immunotherapy.  Patient was referred to palliative care to discuss goals and provide ongoing support for symptoms.  SOCIAL HISTORY:     reports that she quit smoking about 5 months ago. Her smoking use included cigarettes. She has a 50.00 pack-year smoking history. She has never used smokeless tobacco. She reports that she does not currently use alcohol. She reports that she does not use drugs.  Patient is divorced.  She is a Landscape architect for [employer].  Patient is a guardian of her 74 year old granddaughter.  Patient's son recently moved here from [place] to live with her.  Patient also has a son in [place], a daughter in [place], and another daughter in [place].  ADVANCE DIRECTIVES:  Does not have  CODE STATUS: DNR/DNI (MOST form completed on 01/28/21)  PAST MEDICAL HISTORY: Past Medical History:  Diagnosis Date   Barrett's esophagus    COPD (chronic obstructive pulmonary disease) ([place])    Dysrhythmia    stress related at work.   GERD (gastroesophageal reflux disease)    H/O malignant neoplasm of thyroid 01/29/2014   Hepatitis C 2008   Harv[place] 2017   History of kidney stones    Hyperlipidemia    Hypertension    Hypothyroidism    Liver cancer ([place])    metastasized from Lung   Nephrolithiasis    Non-small cell  carcinoma of left lung ([place])    Osteoporosis    Pulmonary mass    Thyroid cancer ([place]) 2012   Thyroid cancer ([place])    Tumor    tumor per pt back of rt eye   Type 2 diabetes mellitus ([place])    Wears dentures    Full upper and lower.  Usually only wears upper    PAST SURGICAL HISTORY:  Past Surgical History:  Procedure Laterality Date   ABDOMINAL HYSTERECTOMY  2006   CATARACT EXTRACTION W/PHACO Right 12/08/2021   Procedure: CATARACT EXTRACTION PHACO AND INTRAOCULAR LENS PLACEMENT (IOC) RIGHT DIABETIC 6.62 00:35.1;  Surgeon: Birder Robson, MD;  Location: [facility];  Service: Ophthalmology;  Laterality: Right;  Diabetic   COLONOSCOPY WITH PROPOFOL N/A 04/25/2015   Procedure: COLONOSCOPY WITH PROPOFOL;  Surgeon: Josefine Class, MD;  Location: [facility] ENDOSCOPY;  Service: Endoscopy;  Laterality: N/A;   CYSTOSCOPY W/ RETROGRADES Bilateral 05/19/2015   Procedure: CYSTOSCOPY WITH RETROGRADE PYELOGRAM;  Surgeon: Hollice Espy, MD;  Location: [facility] ORS;  Service: Urology;  Laterality: Bilateral;   CYSTOSCOPY W/ URETERAL STENT PLACEMENT Right 05/26/2015   Procedure: CYSTOSCOPY WITH STENT REPLACEMENT;  Surgeon: Hollice Espy, MD;  Location: [facility];  Service: Urology;  Laterality: Right;   CYSTOSCOPY WITH STENT PLACEMENT Right 05/19/2015   Procedure: CYSTOSCOPY WITH STENT PLACEMENT;  Surgeon: Hollice Espy, MD;  Location: [facility];  Service: Urology;  Laterality: Right;   CYSTOSCOPY WITH STENT PLACEMENT Right 05/26/2015   Procedure: CYSTOSCOPY WITH STENT PLACEMENT;  Surgeon: Hollice Espy, MD;  Location: [facility];  Service: Urology;  Laterality: Right;   CYSTOSCOPY WITH STENT PLACEMENT Right 06/25/2016   Procedure: CYSTOSCOPY WITH STENT PLACEMENT;  Surgeon: Nickie Retort, MD;  Location: [facility];  Service: Urology;  Laterality: Right;   CYSTOSCOPY WITH STENT PLACEMENT Left 10/31/2017   Procedure: CYSTOSCOPY WITH STENT PLACEMENT;  Surgeon: Hollice Espy, MD;  Location: [facility];   Service: Urology;  Laterality: Left;   CYSTOSCOPY/URETEROSCOPY/HOLMIUM LASER/STENT PLACEMENT Left 07/23/2015   Procedure: CYSTOSCOPY/URETEROSCOPY/HOLMIUM LASER/STENT PLACEMENT/RETROGRADE PYELOGRAM;  Surgeon: Hollice Espy, MD;  Location: [facility];  Service: Urology;  Laterality: Left;   CYSTOSCOPY/URETEROSCOPY/HOLMIUM LASER/STENT PLACEMENT Left 11/16/2017   Procedure: CYSTOSCOPY/URETEROSCOPY/HOLMIUM LASER/STENT Exchange;  Surgeon: Hollice Espy, MD;  Location: [facility];  Service: Urology;  Laterality: Left;   ESOPHAGOGASTRODUODENOSCOPY (EGD) WITH PROPOFOL N/A 04/25/2015   Procedure: ESOPHAGOGASTRODUODENOSCOPY (EGD) WITH PROPOFOL;  Surgeon: Josefine Class, MD;  Location: [facility] ENDOSCOPY;  Service: Endoscopy;  Laterality: N/A;   EYE SURGERY Bilateral 1964   lazy eye repair   EYE SURGERY Bilateral 2001   lazy eye repair   IR IMAGING GUIDED PORT INSERTION  12/08/2020   LITHOTRIPSY  2009   THYROIDECTOMY  2010   URETEROSCOPY WITH HOLMIUM LASER LITHOTRIPSY Right 05/19/2015   Procedure: URETEROSCOPY WITH HOLMIUM LASER LITHOTRIPSY;  Surgeon: Hollice Espy, MD;  Location: [facility];  Service: Urology;  Laterality: Right;   URETEROSCOPY WITH HOLMIUM LASER LITHOTRIPSY Right 05/26/2015   Procedure: URETEROSCOPY WITH HOLMIUM LASER LITHOTRIPSY;  Surgeon: Hollice Espy, MD;  Location: [facility];  Service: Urology;  Laterality: Right;   URETEROSCOPY WITH HOLMIUM LASER LITHOTRIPSY Right 06/25/2016   Procedure: URETEROSCOPY WITH HOLMIUM LASER LITHOTRIPSY;  Surgeon: Nickie Retort, MD;  Location: [facility];  Service: Urology;  Laterality: Right;    HEMATOLOGY/ONCOLOGY HISTORY:  Oncology History Overview Note  IMPRESSION: Hypermetabolic solid pulmonary nodule of the left lower lobe, favor primary lung malignancy.   Numerous hypermetabolic nodules of the lower left pleura, compatible with pleural metastatic disease.   Hypermetabolic subcarinal and left hilar lymph nodes, compatible with metastatic  disease.   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 Recommend attention on follow-up.   Mild asymmetric FDG uptake below mediastinal blood pool within a small nodule of the left parotid gland, favored to be benign. Recommend attention on follow-up.   No evidence of metastatic disease in the abdomen or pelvis.   No evidence of osseous metastatic disease.   ---------------------  # MARCH 2022- incidental LLL [ 0.9 cm] s/p COVID vaccine; [Dr.Aleskerov]; July 2022- left pleural pain-   #  #October 2022 MRI brain-punctate lesion-asymptomatic [D/w- Dr.Vaslow]; December 2022 MRI negative for any acute process; chronic sclerosis of hippocampus-clinically insignificant.  COPD-non-complaint    ------------------   DIAGNOSIS:  A. PLEURAL MASS, LEFT CHEST; BIOPSY:  - DIAGNOSTIC OF MALIGNANCY.  - NON-SMALL CELL CARCINOMA, FAVOR ADENOCARCINOMA.   Comment:  The tumor cells are positive for CK7 and TTF1 (patchy).  They are  negative for p40.   # 12/10/2020-carbo Alimta; cycle #1.    # JUNE 8th, 2023-  Enlarging left lower lobe pulmonary nodule seen on recent CT scan is markedly hypermetabolic, consistent with neoplasm; Hypermetabolic nodal metastatic disease in the left hilum and central mediastinum. JUNE- 2023- RT 20 Fractions.  # NGS PD-L1 TPS 1% ------------------------ OCT 2023- liver Biopsy-positive for "small cell"/high-grade neuroendocrine-   # ? NOV 14th, 2023-  carbo etoposide-Tecentriq every  3 weeks x 4 cycles.     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[place]) (Resolved)  05/02/2015 Initial Diagnosis   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[place])   Cancer of lower lobe of left lung ([place])  12/02/2020 Initial Diagnosis   Cancer of lower lobe of left lung ([place])   12/10/2020 Cancer Staging    Staging form: Lung, AJCC 8th Edition - Clinical: Stage IVA (cT4, cN3, cM1a) - Signed by Cammie Sickle, MD on 12/10/2020   12/10/2020 - 09/16/2021 Chemotherapy   Patient is on Treatment Plan : LUNG CARBOplatin / Pemetrexed / Pembrolizumab q21d Induction x 4 cycles / Maintenance Pemetrexed + Pembrolizumab     12/10/2020 - 11/18/2021 Chemotherapy   Patient is on Treatment Plan : LUNG Carboplatin (5) + Pemetrexed (500) + Pembrolizumab (200) D1 q21d Induction x 4 cycles / Maintenance Pemetrexed (500) + Pembrolizumab (200) D1 q21d     12/22/2021 -  Chemotherapy   Patient is on Treatment Plan : LUNG SCLC Carboplatin + Etoposide + Atezolizumab Induction q21d x 4 cycles / Atezolizumab Maintenance q21d       ALLERGIES:  has No Known Allergies.  MEDICATIONS:  Current Outpatient Medications  Medication Sig Dispense Refill   Accu-Chek Softclix Lancets lancets Check glucose once a day 100 each 12   albuterol (VENTOLIN HFA) 108 (90 Base) MCG/ACT inhaler Inhale 2 puffs into the lungs every 6 (six) hours as needed for wheezing or shortness of breath. 8 g 2   ARTIFICIAL TEAR OP Apply to eye.     benzonatate (TESSALON PERLES) 100 MG capsule Take 1 capsule (100 mg total) by mouth 3 (three) times daily as needed for cough. 90 capsule 1   blood glucose meter kit and supplies KIT Dispense based on patient and insurance preference. Use up to three times daily as directed. 1 each 0   buPROPion (WELLBUTRIN SR) 150 MG 12 hr tablet Take 150 mg by mouth daily.     cholecalciferol (VITAMIN D) 1000 units tablet Take 2,000 Units by mouth daily.     Cranberry-Vitamin C-Probiotic (AZO CRANBERRY PO) Take 1 tablet by mouth daily.     fluticasone-salmeterol (ADVAIR HFA) 45-21 MCG/ACT inhaler Inhale 2 puffs into the lungs 2 (two) times daily. 1 each 12   folic acid (FOLVITE) 1 MG tablet Take 1 tablet (1 mg total) by mouth daily. 90 tablet 1   gabapentin (NEURONTIN) 100 MG capsule Take 1 capsule (100 mg total) by mouth  2 (two) times daily. (Patient taking differently: Take 300 mg by mouth at bedtime.) 30 capsule 2   glipiZIDE (GLUCOTROL) 5 MG tablet Take 1 tablet (5 mg total) by mouth 2 (two) times daily before a meal. 180 tablet 1   levothyroxine (SYNTHROID) 175 MCG tablet Take 1 tablet (175 mcg total) by mouth daily before breakfast. 90 tablet 0   lidocaine-prilocaine (EMLA) cream Apply 1 application topically as needed. 30 g 0   loperamide (IMODIUM) 2 MG capsule Take by mouth as needed for diarrhea or loose stools.     losartan (COZAAR) 100 MG tablet Take 100 mg by mouth every morning.      metoprolol succinate (TOPROL-XL) 25 MG 24 hr tablet Take 25 mg by mouth every morning.   1   montelukast (SINGULAIR) 10 MG tablet TAKE 1 TABLET BY MOUTH AT BEDTIME 30 tablet 0   nystatin (MYCOSTATIN) 100000 UNIT/ML suspension Swish and swallow. Don't drink fluids immediately following administration. (Patient not taking: Reported on 01/19/2022) 400 mL 0   omeprazole (  PRILOSEC) 20 MG capsule Take 20 mg by mouth daily before breakfast.      ondansetron (ZOFRAN) 8 MG tablet Take 1 tablet (8 mg total) by mouth every 8 (eight) hours as needed for nausea or vomiting. Start on the third day after chemotherapy 30 tablet 2   oxyCODONE (OXY IR/ROXICODONE) 5 MG immediate release tablet Take 1 tablet (5 mg total) by mouth every 4 (four) hours as needed for severe pain. 60 tablet 0   prochlorperazine (COMPAZINE) 10 MG tablet Take 1 tablet (10 mg total) by mouth every 6 (six) hours as needed for nausea or vomiting. 30 tablet 2   sucralfate (CARAFATE) 1 g tablet Take 1 tablet (1 g total) by mouth 4 (four) times daily -  with meals and at bedtime. 60 tablet 2   No current facility-administered medications for this visit.   Facility-Administered Medications Ordered in Other Visits  Medication Dose Route Frequency Provider Last Rate Last Admin   atezolizumab (TECENTRIQ) 1,200 mg in sodium chloride 0.9 % 250 mL chemo infusion  1,200 mg  Intravenous Once Finnegan, Kathlene November, MD       CARBOplatin (PARAPLATIN) 580 mg in sodium chloride 0.9 % 250 mL chemo infusion  580 mg Intravenous Once Lloyd Huger, MD       etoposide (VEPESID) 160 mg in sodium chloride 0.9 % 500 mL chemo infusion  80 mg/m2 (Treatment Plan Recorded) Intravenous Once Charlaine Dalton R, MD       heparin lock flush 100 UNIT/ML injection            heparin lock flush 100 UNIT/ML injection            heparin lock flush 100 unit/mL  500 Units Intracatheter Once PRN Lloyd Huger, MD        VITAL SIGNS: There were no vitals taken for this visit. There were no vitals filed for this visit.  Estimated body mass index is 27.1 kg/m as calculated from the following:   Height as of an earlier encounter on 01/19/22: _0  (1.702 m).   Weight as of an earlier encounter on 01/19/22: 173 lb (78.5 kg).  LABS: CBC:    Component Value Date/Time   WBC 10.7 (H) 01/19/2022 0843   HGB 10.9 (L) 01/19/2022 0843   HCT 35.5 (L) 01/19/2022 0843   PLT 304 01/19/2022 0843   MCV 103.2 (H) 01/19/2022 0843   NEUTROABS 8.2 (H) 01/19/2022 0843   LYMPHSABS 1.0 01/19/2022 0843   MONOABS 1.3 (H) 01/19/2022 0843   EOSABS 0.0 01/19/2022 0843   BASOSABS 0.1 01/19/2022 0843   Comprehensive Metabolic Panel:    Component Value Date/Time   NA 141 01/19/2022 0843   K 3.5 01/19/2022 0843   CL 103 01/19/2022 0843   CO2 27 01/19/2022 0843   BUN 9 01/19/2022 0843   CREATININE 0.78 01/19/2022 0843   GLUCOSE 155 (H) 01/19/2022 0843   CALCIUM 8.7 (L) 01/19/2022 0843   AST 27 01/19/2022 0843   ALT 18 01/19/2022 0843   ALKPHOS 88 01/19/2022 0843   BILITOT 0.7 01/19/2022 0843   PROT 6.8 01/19/2022 0843   ALBUMIN 3.5 01/19/2022 0843    RADIOGRAPHIC STUDIES: CT Angio Chest PE W/Cm &/Or Wo Cm  Result Date: 12/26/2021 CLINICAL DATA:  Metastatic left lower lobe non-small cell lung cancer, dizziness, cough, left posterior rib pain EXAM: CT ANGIOGRAPHY CHEST WITH CONTRAST  TECHNIQUE: Multidetector CT imaging of the chest was performed using the standard protocol during bolus administration of intravenous  contrast. Multiplanar CT image reconstructions and MIPs were obtained to evaluate the vascular anatomy. RADIATION DOSE REDUCTION: This exam was performed according to the departmental dose-optimization program which includes automated exposure control, adjustment of the mA and/or kV according to patient size and/or use of iterative reconstruction technique. CONTRAST:  54m OMNIPAQUE IOHEXOL 350 MG/ML SOLN COMPARISON:  11/19/2021, 12/02/2021 FINDINGS: Cardiovascular: This is a technically adequate evaluation of the pulmonary vasculature. No filling defects or pulmonary emboli. The heart is unremarkable without pericardial effusion. No evidence of thoracic aortic aneurysm or dissection. Stable atherosclerosis. 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 Right chest wall port via internal jugular approach, tip within the atriocaval junction. Mediastinum/Nodes: Status post thyroidectomy. Trachea and esophagus are unremarkable. Small hiatal hernia. No pathologic mediastinal, hilar, or axillary adenopathy. Lungs/Pleura: Left lower lobe consolidation in the posterior costophrenic angle obscures the known underlying nodule, with consolidation now measuring 2.7 x 1.6 cm image 121/5 not appreciably changed since prior exam. Other bilateral pulmonary nodules are again identified. Index nodules are as follows: Left lower lobe, image 126/5, 1.3 x 1.0 cm. Previously 1.1 x 0.9 cm. Right upper lobe, image 35/5, 1.0 x 0.7 cm. Previously 1.1 x 0.9 cm. There are multiple new subcentimeter ground-glass nodules throughout the bilateral upper lobes, concerning for progressive metastatic disease over underlying inflammation or infection. No effusion or pneumothorax.  Central airways are patent. Upper Abdomen: There are multiple new and enlarging hypodense  lesions throughout the liver parenchyma consistent with progressive metastatic disease. Index lesion within the left lobe liver image 132/4 measures 3.7 x 3.0 cm, previously 1.4 x 1.3 cm. Musculoskeletal: There are no acute or destructive bony lesions. Reconstructed images demonstrate no additional findings. Review of the MIP images confirms the above findings. IMPRESSION: 1. No evidence of pulmonary embolus. 2. Multiple pulmonary nodules as above, with interval enlargement of the left lower lobe nodule consistent with progression of disease. 3. Progressive hepatic metastatic disease. 4. Progressive bilateral subcentimeter ground-glass nodularity, greatest in the upper lobes, which could reflect progressive metastatic disease versus nonspecific inflammatory/infectious etiology. 5. Aortic Atherosclerosis (ICD10-I70.0). 6. Hiatal hernia. Electronically Signed   By: MRanda NgoM.D.   On: 12/26/2021 21:53   DG Chest 2 View  Result Date: 12/26/2021 CLINICAL DATA:  c/o fall 3-4 days ago, low back and rib pain since that time. Also endorses nausea since chemo treatments changed several days ago. EXAM: CHEST - 2 VIEW COMPARISON:  03/26/2019 FINDINGS: Interval placement of right IJ power port to the distal SVC. No pneumothorax. Patchy airspace infiltrate in the posterior left lower lobe, new since previous. Right lung clear. Heart size and mediastinal contours are within normal limits. No effusion. Visualized bones unremarkable.  No pneumothorax. IMPRESSION: Patchy left lower lobe infiltrate, new since previous. Electronically Signed   By: DLucrezia EuropeM.D.   On: 12/26/2021 19:15    PERFORMANCE STATUS (ECOG) : 1 - Symptomatic but completely ambulatory  Review of Systems Unless otherwise noted, a complete review of systems is negative.  Physical Exam General: NAD Cardiovascular: regular rate and rhythm Pulmonary: clear ant fields Extremities: no edema, no joint deformities Skin: no rashes Neurological:  Grossly nonfocal  IMPRESSION: Follow-up visit.  Patient seen in infusion.  Patient reports that she is doing reasonably well.  She denies significant changes or concerns today.  No significant symptomatic complaints.  She reports chronic abdominal pain but says that that is well-managed with oxycodone, which patient takes on average every 4 hours  while awake.  She says that the oxycodone is effective but fairly short-lived.  We did discuss the option of starting a long-acting opioid but patient feels like the oxycodone is reasonably well controlling pain at present.  Additionally, gabapentin appears to be working well for her.  She says that 300 mg dose with sedating so she is taking 100 mg twice daily.  PLAN: -Continue current scope of treatment -Continue oxycodone/gabapentin -RTC follow-up telephone visit 1 month   Patient expressed understanding and was in agreement with this plan. She also understands that She can call the clinic at any time with any questions, concerns, or complaints.     Time Total: 15 minutes  Visit consisted of counseling and education dealing with the complex and emotionally intense issues of symptom management and palliative care in the setting of serious and potentially life-threatening illness.Greater than 50%  of this time was spent counseling and coordinating care related to the above assessment and plan.  Signed by: Altha Harm, PhD, NP-C

## 2022-01-19 NOTE — Progress Notes (Signed)
Patient lost hair since starting new treatment.  Also requesting more detailed information about her new treatment.    New bilateral rib pain radiating to back, 3/10 pain scale.  Taking Oxycodone that does relieve the pain for 4 hours.

## 2022-01-19 NOTE — Patient Instructions (Signed)
[facility] CANCER CTR AT [place]  Discharge Instructions: Thank you for choosing [facility] to provide your oncology and hematology care.  If you have a lab appointment with the [facility], please go directly to the [facility] and check in at the registration area.  Wear comfortable clothing and clothing appropriate for easy access to any Portacath or PICC line.   We strive to give you quality time with your provider. You may need to reschedule your appointment if you arrive late (15 or more minutes).  Arriving late affects you and other patients whose appointments are after yours.  Also, if you miss three or more appointments without notifying the office, you may be dismissed from the clinic at the provider's discretion.      For prescription refill requests, have your pharmacy contact our office and allow 72 hours for refills to be completed.    Today you received the following chemotherapy and/or immunotherapy agents Tecentriq, etoposide, Carboplatin       To help prevent nausea and vomiting after your treatment, we encourage you to take your nausea medication as directed.  BELOW ARE SYMPTOMS THAT SHOULD BE REPORTED IMMEDIATELY: *FEVER GREATER THAN 100.4 F (38 C) OR HIGHER *CHILLS OR SWEATING *NAUSEA AND VOMITING THAT IS NOT CONTROLLED WITH YOUR NAUSEA MEDICATION *UNUSUAL SHORTNESS OF BREATH *UNUSUAL BRUISING OR BLEEDING *URINARY PROBLEMS (pain or burning when urinating, or frequent urination) *BOWEL PROBLEMS (unusual diarrhea, constipation, pain near the anus) TENDERNESS IN MOUTH AND THROAT WITH OR WITHOUT PRESENCE OF ULCERS (sore throat, sores in mouth, or a toothache) UNUSUAL RASH, SWELLING OR PAIN  UNUSUAL VAGINAL DISCHARGE OR ITCHING   Items with * indicate a potential emergency and should be followed up as soon as possible or go to the Emergency Department if any problems should occur.  Please show the CHEMOTHERAPY ALERT CARD or IMMUNOTHERAPY  ALERT CARD at check-in to the Emergency Department and triage nurse.  Should you have questions after your visit or need to cancel or reschedule your appointment, please contact [facility] CANCER [place] AT [place]  [phone] and follow the prompts.  Office hours are 8:00 a.m. to 4:30 p.m. Monday - Friday. Please note that voicemails left after 4:00 p.m. may not be returned until the following business day.  We are closed weekends and major holidays. You have access to a nurse at all times for urgent questions. Please call the main number to the clinic [phone] and follow the prompts.  For any non-urgent questions, you may also contact your provider using MyChart. We now offer e-Visits for anyone 29 and older to request care online for non-urgent symptoms. For details visit [email or web address].   Also download the MyChart app! Go to the app store, search "MyChart", open the app, select [facility], and log in with your MyChart username and password.  Masks are optional in the cancer centers. If you would like for your care team to wear a mask while they are taking care of you, please let them know. For doctor visits, patients may have with them one support person who is at least 65 years old. At this time, visitors are not allowed in the infusion area.

## 2022-01-20 ENCOUNTER — Inpatient Hospital Stay: Payer: 59

## 2022-01-20 ENCOUNTER — Ambulatory Visit: Payer: 59

## 2022-01-20 VITALS — BP 135/69 | HR 82 | Temp 96.7°F | Resp 18

## 2022-01-20 DIAGNOSIS — Z5112 Encounter for antineoplastic immunotherapy: Secondary | ICD-10-CM | POA: Diagnosis not present

## 2022-01-20 DIAGNOSIS — C3432 Malignant neoplasm of lower lobe, left bronchus or lung: Secondary | ICD-10-CM

## 2022-01-20 MED ORDER — SODIUM CHLORIDE 0.9 % IV SOLN
80.0000 mg/m2 | Freq: Once | INTRAVENOUS | Status: AC
  Administered 2022-01-20: 160 mg via INTRAVENOUS
  Filled 2022-01-20: qty 8

## 2022-01-20 MED ORDER — SODIUM CHLORIDE 0.9 % IV SOLN
10.0000 mg | Freq: Once | INTRAVENOUS | Status: AC
  Administered 2022-01-20: 10 mg via INTRAVENOUS
  Filled 2022-01-20: qty 10

## 2022-01-20 MED ORDER — SODIUM CHLORIDE 0.9 % IV SOLN
Freq: Once | INTRAVENOUS | Status: AC
  Filled 2022-01-20: qty 250

## 2022-01-20 MED ORDER — HEPARIN SOD (PORK) LOCK FLUSH 100 UNIT/ML IV SOLN
500.0000 [IU] | Freq: Once | INTRAVENOUS | Status: AC | PRN
  Administered 2022-01-20: 500 [IU]
  Filled 2022-01-20: qty 5

## 2022-01-20 MED ORDER — SODIUM CHLORIDE 0.9% FLUSH
10.0000 mL | INTRAVENOUS | Status: DC | PRN
  Administered 2022-01-20: 10 mL
  Filled 2022-01-20: qty 10

## 2022-01-20 MED FILL — Dexamethasone Sodium Phosphate Inj 100 MG/10ML: INTRAMUSCULAR | Qty: 1 | Status: AC

## 2022-01-20 NOTE — Patient Instructions (Signed)
[facility] CANCER CTR AT [place]  Discharge Instructions: Thank you for choosing [facility] to provide your oncology and hematology care.  If you have a lab appointment with the [facility], please go directly to the [facility] and check in at the registration area.  Wear comfortable clothing and clothing appropriate for easy access to any Portacath or PICC line.   We strive to give you quality time with your provider. You may need to reschedule your appointment if you arrive late (15 or more minutes).  Arriving late affects you and other patients whose appointments are after yours.  Also, if you miss three or more appointments without notifying the office, you may be dismissed from the clinic at the provider's discretion.      For prescription refill requests, have your pharmacy contact our office and allow 72 hours for refills to be completed.    Today you received the following chemotherapy and/or immunotherapy agents  etoposide   To help prevent nausea and vomiting after your treatment, we encourage you to take your nausea medication as directed.  BELOW ARE SYMPTOMS THAT SHOULD BE REPORTED IMMEDIATELY: *FEVER GREATER THAN 100.4 F (38 C) OR HIGHER *CHILLS OR SWEATING *NAUSEA AND VOMITING THAT IS NOT CONTROLLED WITH YOUR NAUSEA MEDICATION *UNUSUAL SHORTNESS OF BREATH *UNUSUAL BRUISING OR BLEEDING *URINARY PROBLEMS (pain or burning when urinating, or frequent urination) *BOWEL PROBLEMS (unusual diarrhea, constipation, pain near the anus) TENDERNESS IN MOUTH AND THROAT WITH OR WITHOUT PRESENCE OF ULCERS (sore throat, sores in mouth, or a toothache) UNUSUAL RASH, SWELLING OR PAIN  UNUSUAL VAGINAL DISCHARGE OR ITCHING   Items with * indicate a potential emergency and should be followed up as soon as possible or go to the Emergency Department if any problems should occur.  Please show the CHEMOTHERAPY ALERT CARD or IMMUNOTHERAPY ALERT CARD at check-in to the  Emergency Department and triage nurse.  Should you have questions after your visit or need to cancel or reschedule your appointment, please contact [facility] CANCER [place] AT [place]  [phone] and follow the prompts.  Office hours are 8:00 a.m. to 4:30 p.m. Monday - Friday. Please note that voicemails left after 4:00 p.m. may not be returned until the following business day.  We are closed weekends and major holidays. You have access to a nurse at all times for urgent questions. Please call the main number to the clinic [phone] and follow the prompts.  For any non-urgent questions, you may also contact your provider using MyChart. We now offer e-Visits for anyone 52 and older to request care online for non-urgent symptoms. For details visit [email or web address].   Also download the MyChart app! Go to the app store, search "MyChart", open the app, select , and log in with your MyChart username and password.  Masks are optional in the cancer centers. If you would like for your care team to wear a mask while they are taking care of you, please let them know. For doctor visits, patients may have with them one support person who is at least 64 years old. At this time, visitors are not allowed in the infusion area.

## 2022-01-21 ENCOUNTER — Inpatient Hospital Stay: Payer: 59

## 2022-01-21 ENCOUNTER — Other Ambulatory Visit: Payer: Medicaid Other | Admitting: Nurse Practitioner

## 2022-01-21 ENCOUNTER — Ambulatory Visit: Payer: 59

## 2022-01-21 VITALS — BP 133/66 | HR 85 | Temp 96.8°F | Resp 16

## 2022-01-21 DIAGNOSIS — Z5112 Encounter for antineoplastic immunotherapy: Secondary | ICD-10-CM | POA: Diagnosis not present

## 2022-01-21 DIAGNOSIS — C3432 Malignant neoplasm of lower lobe, left bronchus or lung: Secondary | ICD-10-CM

## 2022-01-21 MED ORDER — SODIUM CHLORIDE 0.9 % IV SOLN
80.0000 mg/m2 | Freq: Once | INTRAVENOUS | Status: AC
  Administered 2022-01-21: 160 mg via INTRAVENOUS
  Filled 2022-01-21: qty 8

## 2022-01-21 MED ORDER — HEPARIN SOD (PORK) LOCK FLUSH 100 UNIT/ML IV SOLN
INTRAVENOUS | Status: AC
  Administered 2022-01-21: 500 [IU]
  Filled 2022-01-21: qty 5

## 2022-01-21 MED ORDER — SODIUM CHLORIDE 0.9 % IV SOLN
Freq: Once | INTRAVENOUS | Status: AC
  Filled 2022-01-21: qty 250

## 2022-01-21 MED ORDER — SODIUM CHLORIDE 0.9 % IV SOLN
10.0000 mg | Freq: Once | INTRAVENOUS | Status: AC
  Administered 2022-01-21: 10 mg via INTRAVENOUS
  Filled 2022-01-21: qty 10

## 2022-01-21 NOTE — Patient Instructions (Signed)
[facility] CANCER CTR AT [place]  Discharge Instructions: Thank you for choosing [facility] to provide your oncology and hematology care.  If you have a lab appointment with the [facility], please go directly to the [facility] and check in at the registration area.  Wear comfortable clothing and clothing appropriate for easy access to any Portacath or PICC line.   We strive to give you quality time with your provider. You may need to reschedule your appointment if you arrive late (15 or more minutes).  Arriving late affects you and other patients whose appointments are after yours.  Also, if you miss three or more appointments without notifying the office, you may be dismissed from the clinic at the provider's discretion.      For prescription refill requests, have your pharmacy contact our office and allow 72 hours for refills to be completed.    Today you received the following chemotherapy and/or immunotherapy agents Etoposide       To help prevent nausea and vomiting after your treatment, we encourage you to take your nausea medication as directed.  BELOW ARE SYMPTOMS THAT SHOULD BE REPORTED IMMEDIATELY: *FEVER GREATER THAN 100.4 F (38 C) OR HIGHER *CHILLS OR SWEATING *NAUSEA AND VOMITING THAT IS NOT CONTROLLED WITH YOUR NAUSEA MEDICATION *UNUSUAL SHORTNESS OF BREATH *UNUSUAL BRUISING OR BLEEDING *URINARY PROBLEMS (pain or burning when urinating, or frequent urination) *BOWEL PROBLEMS (unusual diarrhea, constipation, pain near the anus) TENDERNESS IN MOUTH AND THROAT WITH OR WITHOUT PRESENCE OF ULCERS (sore throat, sores in mouth, or a toothache) UNUSUAL RASH, SWELLING OR PAIN  UNUSUAL VAGINAL DISCHARGE OR ITCHING   Items with * indicate a potential emergency and should be followed up as soon as possible or go to the Emergency Department if any problems should occur.  Please show the CHEMOTHERAPY ALERT CARD or IMMUNOTHERAPY ALERT CARD at check-in to  the Emergency Department and triage nurse.  Should you have questions after your visit or need to cancel or reschedule your appointment, please contact [facility] CANCER [place] AT [place]  [phone] and follow the prompts.  Office hours are 8:00 a.m. to 4:30 p.m. Monday - Friday. Please note that voicemails left after 4:00 p.m. may not be returned until the following business day.  We are closed weekends and major holidays. You have access to a nurse at all times for urgent questions. Please call the main number to the clinic [phone] and follow the prompts.  For any non-urgent questions, you may also contact your provider using MyChart. We now offer e-Visits for anyone 45 and older to request care online for non-urgent symptoms. For details visit [email or web address].   Also download the MyChart app! Go to the app store, search "MyChart", open the app, select [place], and log in with your MyChart username and password.  Masks are optional in the cancer centers. If you would like for your care team to wear a mask while they are taking care of you, please let them know. For doctor visits, patients may have with them one support person who is at least 64 years old. At this time, visitors are not allowed in the infusion area.

## 2022-01-22 ENCOUNTER — Telehealth: Payer: 59 | Admitting: Hospice and Palliative Medicine

## 2022-01-22 ENCOUNTER — Inpatient Hospital Stay: Payer: 59

## 2022-01-22 ENCOUNTER — Encounter: Payer: 59 | Admitting: Hospice and Palliative Medicine

## 2022-01-22 DIAGNOSIS — C3432 Malignant neoplasm of lower lobe, left bronchus or lung: Secondary | ICD-10-CM

## 2022-01-22 DIAGNOSIS — Z5112 Encounter for antineoplastic immunotherapy: Secondary | ICD-10-CM | POA: Diagnosis not present

## 2022-01-22 MED ORDER — PEGFILGRASTIM INJECTION 6 MG/0.6ML ~~LOC~~
6.0000 mg | PREFILLED_SYRINGE | Freq: Once | SUBCUTANEOUS | Status: AC
  Administered 2022-01-22: 6 mg via SUBCUTANEOUS
  Filled 2022-01-22: qty 0.6

## 2022-01-26 ENCOUNTER — Inpatient Hospital Stay (HOSPITAL_BASED_OUTPATIENT_CLINIC_OR_DEPARTMENT_OTHER): Payer: 59 | Admitting: Nurse Practitioner

## 2022-01-26 ENCOUNTER — Inpatient Hospital Stay: Payer: 59

## 2022-01-26 ENCOUNTER — Other Ambulatory Visit: Payer: Self-pay | Admitting: Internal Medicine

## 2022-01-26 ENCOUNTER — Other Ambulatory Visit: Payer: Self-pay

## 2022-01-26 ENCOUNTER — Encounter: Payer: Self-pay | Admitting: Nurse Practitioner

## 2022-01-26 VITALS — BP 107/69 | HR 80 | Temp 97.1°F | Resp 18 | Ht 67.0 in | Wt 168.9 lb

## 2022-01-26 DIAGNOSIS — C3432 Malignant neoplasm of lower lobe, left bronchus or lung: Secondary | ICD-10-CM

## 2022-01-26 DIAGNOSIS — Z09 Encounter for follow-up examination after completed treatment for conditions other than malignant neoplasm: Secondary | ICD-10-CM | POA: Diagnosis not present

## 2022-01-26 DIAGNOSIS — C787 Secondary malignant neoplasm of liver and intrahepatic bile duct: Secondary | ICD-10-CM

## 2022-01-26 DIAGNOSIS — G893 Neoplasm related pain (acute) (chronic): Secondary | ICD-10-CM

## 2022-01-26 DIAGNOSIS — Z5189 Encounter for other specified aftercare: Secondary | ICD-10-CM

## 2022-01-26 DIAGNOSIS — T451X5A Adverse effect of antineoplastic and immunosuppressive drugs, initial encounter: Secondary | ICD-10-CM

## 2022-01-26 DIAGNOSIS — C782 Secondary malignant neoplasm of pleura: Secondary | ICD-10-CM | POA: Diagnosis not present

## 2022-01-26 DIAGNOSIS — R110 Nausea: Secondary | ICD-10-CM

## 2022-01-26 DIAGNOSIS — Z5112 Encounter for antineoplastic immunotherapy: Secondary | ICD-10-CM | POA: Diagnosis not present

## 2022-01-26 LAB — COMPREHENSIVE METABOLIC PANEL
ALT: 21 U/L (ref 0–44)
AST: 22 U/L (ref 15–41)
Albumin: 3.5 g/dL (ref 3.5–5.0)
Alkaline Phosphatase: 118 U/L (ref 38–126)
Anion gap: 12 (ref 5–15)
BUN: 12 mg/dL (ref 8–23)
CO2: 26 mmol/L (ref 22–32)
Calcium: 8.7 mg/dL — ABNORMAL LOW (ref 8.9–10.3)
Chloride: 102 mmol/L (ref 98–111)
Creatinine, Ser: 0.59 mg/dL (ref 0.44–1.00)
GFR, Estimated: >60 mL/min (ref >60)
Glucose, Bld: 114 mg/dL — ABNORMAL HIGH (ref 70–99)
Potassium: 3.4 mmol/L — ABNORMAL LOW (ref 3.5–5.1)
Sodium: 140 mmol/L (ref 135–145)
Total Bilirubin: 0.6 mg/dL (ref 0.3–1.2)
Total Protein: 6.7 g/dL (ref 6.5–8.1)

## 2022-01-26 LAB — CBC WITH DIFFERENTIAL/PLATELET
Abs Immature Granulocytes: 0.51 10*3/uL — ABNORMAL HIGH (ref 0.00–0.07)
Basophils Absolute: 0.1 10*3/uL (ref 0.0–0.1)
Basophils Relative: 0 %
Eosinophils Absolute: 0.2 10*3/uL (ref 0.0–0.5)
Eosinophils Relative: 1 %
HCT: 33.7 % — ABNORMAL LOW (ref 36.0–46.0)
Hemoglobin: 10.6 g/dL — ABNORMAL LOW (ref 12.0–15.0)
Immature Granulocytes: 2 %
Lymphocytes Relative: 4 %
Lymphs Abs: 1 10*3/uL (ref 0.7–4.0)
MCH: 31.7 pg (ref 26.0–34.0)
MCHC: 31.5 g/dL (ref 30.0–36.0)
MCV: 100.9 fL — ABNORMAL HIGH (ref 80.0–100.0)
Monocytes Absolute: 0.5 10*3/uL (ref 0.1–1.0)
Monocytes Relative: 2 %
Neutro Abs: 23.4 10*3/uL — ABNORMAL HIGH (ref 1.7–7.7)
Neutrophils Relative %: 91 %
Platelets: 183 10*3/uL (ref 150–400)
RBC: 3.34 MIL/uL — ABNORMAL LOW (ref 3.87–5.11)
RDW: 17.2 % — ABNORMAL HIGH (ref 11.5–15.5)
Smear Review: NORMAL
WBC: 25.8 10*3/uL — ABNORMAL HIGH (ref 4.0–10.5)
nRBC: 0 % (ref 0.0–0.2)

## 2022-01-26 MED ORDER — LIDOCAINE 5 % EX PTCH: 1.0000 | MEDICATED_PATCH | CUTANEOUS | 0 refills | Status: DC

## 2022-01-26 MED ORDER — SODIUM CHLORIDE 0.9 % IV SOLN: Freq: Once | INTRAVENOUS | Status: DC

## 2022-01-26 MED ORDER — SODIUM CHLORIDE 0.9 % IV SOLN
Freq: Once | INTRAVENOUS | Status: AC
  Filled 2022-01-26: qty 250

## 2022-01-26 MED ORDER — ONDANSETRON HCL 4 MG/2ML IJ SOLN
8.0000 mg | Freq: Once | INTRAMUSCULAR | Status: AC
  Administered 2022-01-26: 8 mg via INTRAVENOUS
  Filled 2022-01-26: qty 4

## 2022-01-26 MED ORDER — HEPARIN SOD (PORK) LOCK FLUSH 100 UNIT/ML IV SOLN
500.0000 [IU] | Freq: Once | INTRAVENOUS | Status: AC
  Administered 2022-01-26: 500 [IU] via INTRAVENOUS
  Filled 2022-01-26: qty 5

## 2022-01-26 MED ORDER — SODIUM CHLORIDE 0.9% FLUSH
10.0000 mL | Freq: Once | INTRAVENOUS | Status: AC
  Administered 2022-01-26: 10 mL via INTRAVENOUS
  Filled 2022-01-26: qty 10

## 2022-01-26 NOTE — Progress Notes (Signed)
[facility] CONSULT NOTE  Patient Care Team: Dion Body, MD as PCP - General (Family Medicine) Leata Mouse, PA-C Byrnett, Forest Gleason, MD (General Surgery) Telford Nab, RN as Oncology Nurse Navigator Cammie Sickle, MD as Consulting Physician (Oncology)  CHIEF COMPLAINTS/PURPOSE OF CONSULTATION: lung cancer  Oncology History Overview Note  IMPRESSION: Hypermetabolic solid pulmonary nodule of the left lower lobe, favor primary lung malignancy.   Numerous hypermetabolic nodules of the lower left pleura, compatible with pleural metastatic disease.   Hypermetabolic subcarinal and left hilar lymph nodes, compatible with metastatic disease.   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 Recommend attention on follow-up.   Mild asymmetric FDG uptake below mediastinal blood pool within a small nodule of the left parotid gland, favored to be benign. Recommend attention on follow-up.   No evidence of metastatic disease in the abdomen or pelvis.   No evidence of osseous metastatic disease.   ---------------------  # MARCH 2022- incidental LLL [ 0.9 cm] s/p COVID vaccine; [Dr.Aleskerov]; July 2022- left pleural pain-   #  #October 2022 MRI brain-punctate lesion-asymptomatic [D/w- Dr.Vaslow]; December 2022 MRI negative for any acute process; chronic sclerosis of hippocampus-clinically insignificant.  COPD-non-complaint    ------------------   DIAGNOSIS:  A. PLEURAL MASS, LEFT CHEST; BIOPSY:  - DIAGNOSTIC OF MALIGNANCY.  - NON-SMALL CELL CARCINOMA, FAVOR ADENOCARCINOMA.   Comment:  The tumor cells are positive for CK7 and TTF1 (patchy).  They are  negative for p40.   # 12/10/2020-carbo Alimta; cycle #1.    # JUNE 8th, 2023-  Enlarging left lower  lobe pulmonary nodule seen on recent CT scan is markedly hypermetabolic, consistent with neoplasm; Hypermetabolic nodal metastatic disease in the left hilum and central mediastinum. JUNE- 2023- RT 20 Fractions.  # NGS PD-L1 TPS 1% ------------------------ OCT 2023- liver Biopsy-positive for "small cell"/high-grade neuroendocrine-   # ? NOV 14th, 2023-  carbo etoposide-Tecentriq every 3 weeks x 4 cycles.     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HCC) (Resolved)  05/02/2015 Initial Diagnosis   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[place])   Cancer of lower lobe of left lung ([place])  12/02/2020 Initial Diagnosis   Cancer of lower lobe of left lung ([place])   12/10/2020 Cancer Staging   Staging form: Lung, AJCC 8th Edition - Clinical: Stage IVA (cT4, cN3, cM1a) - Signed by Cammie Sickle, MD on 12/10/2020   12/10/2020 - 09/16/2021 Chemotherapy   Patient is on Treatment Plan : LUNG CARBOplatin / Pemetrexed / Pembrolizumab q21d Induction x 4 cycles / Maintenance Pemetrexed + Pembrolizumab     12/10/2020 - 11/18/2021 Chemotherapy   Patient is on Treatment Plan : LUNG Carboplatin (5) + Pemetrexed (500) + Pembrolizumab (200) D1 q21d Induction x 4 cycles / Maintenance Pemetrexed (500) + Pembrolizumab (200) D1 q21d     12/22/2021 -  Chemotherapy   Patient is on Treatment Plan : LUNG SCLC Carboplatin + Etoposide + Atezolizumab Induction q21d x 4 cycles / Atezolizumab Maintenance q21d        HISTORY OF PRESENTING ILLNESS: Ambulating independently.   Arnesha Bangerter 66 y.o.  female history of smoking-metastatic adeno ca lung cancer currently transformed/SMALL CELL LUNG CA [s/p liver biopsy] chemoimmunotherapy is here for follow-up.  Patient currently s/p cycle #2 of carbo-etoposide-tecentriq chemotherapy, dose reduced d/t hospitalization d/t fatigue, presyncopal episodes, and UTI after  cycle 1. She has ongoing pain of ribs and sciatica. She requests lidoderm patch. Has used these previously with  improvement in pain. Pain is not worse. Diarrhea is stable and mild. Has nausea and impaired intake.   Review of Systems  Constitutional:  Positive for malaise/fatigue. Negative for chills, diaphoresis, fever and weight loss.  Respiratory:  Positive for cough and sputum production. Negative for hemoptysis and shortness of breath.   Cardiovascular:  Negative for palpitations, orthopnea and leg swelling.  Gastrointestinal:  Positive for diarrhea and nausea. Negative for abdominal pain, blood in stool, constipation, heartburn, melena and vomiting.  Genitourinary:  Negative for dysuria, frequency and urgency.  Musculoskeletal:  Negative for back pain, falls and joint pain.  Skin:  Negative for itching and rash.  Neurological:  Negative for dizziness, tingling, focal weakness, loss of consciousness and weakness.  Endo/Heme/Allergies:  Does not bruise/bleed easily.  Psychiatric/Behavioral:  Negative for depression. The patient is not nervous/anxious and does not have insomnia.      MEDICAL HISTORY:  Past Medical History:  Diagnosis Date   Barrett's esophagus    COPD (chronic obstructive pulmonary disease) ([place])    Dysrhythmia    stress related at work.   GERD (gastroesophageal reflux disease)    H/O malignant neoplasm of thyroid 01/29/2014   Hepatitis C 2008   Harvoni 2017   History of kidney stones    Hyperlipidemia    Hypertension    Hypothyroidism    Liver cancer ([place])    metastasized from Lung   Nephrolithiasis    Non-small cell carcinoma of left lung ([place])    Osteoporosis    Pulmonary mass    Thyroid cancer ([place]) 2012   Thyroid cancer ([place])    Tumor    tumor per pt back of rt eye   Type 2 diabetes mellitus ([place])    Wears dentures    Full upper and lower.  Usually only wears upper    SURGICAL HISTORY: Past Surgical History:  Procedure Laterality Date   ABDOMINAL HYSTERECTOMY  2006   CATARACT EXTRACTION W/PHACO Right 12/08/2021   Procedure: CATARACT EXTRACTION PHACO AND  INTRAOCULAR LENS PLACEMENT (IOC) RIGHT DIABETIC 6.62 00:35.1;  Surgeon: Birder Robson, MD;  Location: [facility];  Service: Ophthalmology;  Laterality: Right;  Diabetic   COLONOSCOPY WITH PROPOFOL N/A 04/25/2015   Procedure: COLONOSCOPY WITH PROPOFOL;  Surgeon: Josefine Class, MD;  Location: [facility] ENDOSCOPY;  Service: Endoscopy;  Laterality: N/A;   CYSTOSCOPY W/ RETROGRADES Bilateral 05/19/2015   Procedure: CYSTOSCOPY WITH RETROGRADE PYELOGRAM;  Surgeon: Hollice Espy, MD;  Location: [facility] ORS;  Service: Urology;  Laterality: Bilateral;   CYSTOSCOPY W/ URETERAL STENT PLACEMENT Right 05/26/2015   Procedure: CYSTOSCOPY WITH STENT REPLACEMENT;  Surgeon: Hollice Espy, MD;  Location: [facility] ORS;  Service: Urology;  Laterality: Right;   CYSTOSCOPY WITH STENT PLACEMENT Right 05/19/2015   Procedure: CYSTOSCOPY WITH STENT PLACEMENT;  Surgeon: Hollice Espy, MD;  Location: [facility] ORS;  Service: Urology;  Laterality: Right;   CYSTOSCOPY WITH STENT PLACEMENT Right 05/26/2015   Procedure: CYSTOSCOPY WITH STENT PLACEMENT;  Surgeon: Hollice Espy, MD;  Location: [facility] ORS;  Service: Urology;  Laterality: Right;   CYSTOSCOPY WITH STENT PLACEMENT Right 06/25/2016   Procedure: CYSTOSCOPY WITH STENT PLACEMENT;  Surgeon: Nickie Retort, MD;  Location: [facility] ORS;  Service: Urology;  Laterality: Right;   CYSTOSCOPY WITH STENT PLACEMENT Left 10/31/2017   Procedure: CYSTOSCOPY WITH STENT PLACEMENT;  Surgeon: Hollice Espy, MD;  Location: [facility] ORS;  Service: Urology;  Laterality:  Left;   CYSTOSCOPY/URETEROSCOPY/HOLMIUM LASER/STENT PLACEMENT Left 07/23/2015   Procedure: CYSTOSCOPY/URETEROSCOPY/HOLMIUM LASER/STENT PLACEMENT/RETROGRADE PYELOGRAM;  Surgeon: Hollice Espy, MD;  Location: [facility] ORS;  Service: Urology;  Laterality: Left;   CYSTOSCOPY/URETEROSCOPY/HOLMIUM LASER/STENT PLACEMENT Left 11/16/2017   Procedure: CYSTOSCOPY/URETEROSCOPY/HOLMIUM LASER/STENT Exchange;  Surgeon: Hollice Espy, MD;  Location: [facility]  ORS;  Service: Urology;  Laterality: Left;   ESOPHAGOGASTRODUODENOSCOPY (EGD) WITH PROPOFOL N/A 04/25/2015   Procedure: ESOPHAGOGASTRODUODENOSCOPY (EGD) WITH PROPOFOL;  Surgeon: Josefine Class, MD;  Location: [facility] ENDOSCOPY;  Service: Endoscopy;  Laterality: N/A;   EYE SURGERY Bilateral 1964   lazy eye repair   EYE SURGERY Bilateral 2001   lazy eye repair   IR IMAGING GUIDED PORT INSERTION  12/08/2020   LITHOTRIPSY  2009   THYROIDECTOMY  2010   URETEROSCOPY WITH HOLMIUM LASER LITHOTRIPSY Right 05/19/2015   Procedure: URETEROSCOPY WITH HOLMIUM LASER LITHOTRIPSY;  Surgeon: Hollice Espy, MD;  Location: [facility] ORS;  Service: Urology;  Laterality: Right;   URETEROSCOPY WITH HOLMIUM LASER LITHOTRIPSY Right 05/26/2015   Procedure: URETEROSCOPY WITH HOLMIUM LASER LITHOTRIPSY;  Surgeon: Hollice Espy, MD;  Location: [facility] ORS;  Service: Urology;  Laterality: Right;   URETEROSCOPY WITH HOLMIUM LASER LITHOTRIPSY Right 06/25/2016   Procedure: URETEROSCOPY WITH HOLMIUM LASER LITHOTRIPSY;  Surgeon: Nickie Retort, MD;  Location: [facility] ORS;  Service: Urology;  Laterality: Right;    SOCIAL HISTORY: Social History   Socioeconomic History   Marital status: Divorced    Spouse name: Not on file   Number of children: Not on file   Years of education: Not on file   Highest education level: Not on file  Occupational History   Not on file  Tobacco Use   Smoking status: Former    Packs/day: 1.00    Years: 50.00    Total pack years: 50.00    Types: Cigarettes    Quit date: 08/15/2021    Years since quitting: 0.4   Smokeless tobacco: Never   Tobacco comments:    Started smoking age 83  Vaping Use   Vaping Use: Former  Substance and Sexual Activity   Alcohol use: Not Currently    Comment: socially   Drug use: No   Sexual activity: Not Currently    Birth control/protection: None  Other Topics Concern   Not on file  Social History Narrative   Smoking: 1p 1-2 days since 17 years; alcohol: rare;  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     Social Determinants of Health   Financial Resource Strain: Not on file  Food Insecurity: No Food Insecurity (12/27/2021)   Hunger Vital Sign    Worried About Running Out of Food in the Last Year: Never true    Ran Out of Food in the Last Year: Never true  Transportation Needs: No Transportation Needs (12/27/2021)   PRAPARE - Hydrologist (Medical): No    Lack of Transportation (Non-Medical): No  Recent Concern: Transportation Needs - Unmet Transportation Needs (10/02/2021)   PRAPARE - Hydrologist (Medical): Yes    Lack of Transportation (Non-Medical): Yes  Physical Activity: Not on file  Stress: Not on file  Social Connections: Not on file  Intimate Partner Violence: Not At Risk (12/27/2021)   Humiliation, Afraid, Rape, and Kick questionnaire    Fear of Current or Ex-Partner: No    Emotionally Abused: No    Physically Abused: No    Sexually Abused: No  FAMILY HISTORY: Family History  Problem Relation Age of Onset   CAD Mother    Heart disease Mother    Dementia Father    Bladder Cancer Neg Hx    Prostate cancer Neg Hx    Kidney cancer Neg Hx    Breast cancer Neg Hx     ALLERGIES:  has No Known Allergies.  MEDICATIONS:  Current Outpatient Medications  Medication Sig Dispense Refill   ARTIFICIAL TEAR OP Apply to eye.     blood glucose meter kit and supplies KIT Dispense based on patient and insurance preference. Use up to three times daily as directed. 1 each 0   buPROPion (WELLBUTRIN SR) 150 MG 12 hr tablet Take 150 mg by mouth daily.     cholecalciferol (VITAMIN D) 1000 units tablet Take 2,000 Units by mouth daily.     Cranberry-Vitamin C-Probiotic (AZO CRANBERRY PO) Take 1 tablet by mouth daily.     folic acid (FOLVITE) 1 MG tablet Take 1 tablet (1 mg total) by mouth daily. 90 tablet 1    gabapentin (NEURONTIN) 100 MG capsule Take 1 capsule (100 mg total) by mouth 2 (two) times daily. (Patient taking differently: Take 300 mg by mouth at bedtime.) 30 capsule 2   glipiZIDE (GLUCOTROL) 5 MG tablet Take 1 tablet (5 mg total) by mouth 2 (two) times daily before a meal. 180 tablet 1   levothyroxine (SYNTHROID) 175 MCG tablet Take 1 tablet (175 mcg total) by mouth daily before breakfast. 90 tablet 0   lidocaine-prilocaine (EMLA) cream Apply 1 application topically as needed. 30 g 0   losartan (COZAAR) 100 MG tablet Take 100 mg by mouth every morning.      metoprolol succinate (TOPROL-XL) 25 MG 24 hr tablet Take 25 mg by mouth every morning.   1   montelukast (SINGULAIR) 10 MG tablet TAKE 1 TABLET BY MOUTH AT BEDTIME 30 tablet 0   omeprazole (PRILOSEC) 20 MG capsule Take 20 mg by mouth daily before breakfast.      oxyCODONE (OXY IR/ROXICODONE) 5 MG immediate release tablet Take 1 tablet (5 mg total) by mouth every 4 (four) hours as needed for severe pain. 60 tablet 0   Accu-Chek Softclix Lancets lancets Check glucose once a day (Patient not taking: Reported on 01/26/2022) 100 each 12   albuterol (VENTOLIN HFA) 108 (90 Base) MCG/ACT inhaler Inhale 2 puffs into the lungs every 6 (six) hours as needed for wheezing or shortness of breath. (Patient not taking: Reported on 01/26/2022) 8 g 2   benzonatate (TESSALON PERLES) 100 MG capsule Take 1 capsule (100 mg total) by mouth 3 (three) times daily as needed for cough. (Patient not taking: Reported on 01/26/2022) 90 capsule 1   fluticasone-salmeterol (ADVAIR HFA) 45-21 MCG/ACT inhaler Inhale 2 puffs into the lungs 2 (two) times daily. (Patient not taking: Reported on 01/26/2022) 1 each 12   loperamide (IMODIUM) 2 MG capsule Take by mouth as needed for diarrhea or loose stools. (Patient not taking: Reported on 01/26/2022)     ondansetron (ZOFRAN) 8 MG tablet Take 1 tablet (8 mg total) by mouth every 8 (eight) hours as needed for nausea or vomiting.  Start on the third day after chemotherapy (Patient not taking: Reported on 01/26/2022) 30 tablet 2   prochlorperazine (COMPAZINE) 10 MG tablet Take 1 tablet (10 mg total) by mouth every 6 (six) hours as needed for nausea or vomiting. (Patient not taking: Reported on 01/26/2022) 30 tablet 2   No current facility-administered medications for  this visit.   Facility-Administered Medications Ordered in Other Visits  Medication Dose Route Frequency Provider Last Rate Last Admin   heparin lock flush 100 UNIT/ML injection            heparin lock flush 100 unit/mL  500 Units Intravenous Once Charlaine Dalton R, MD       .  PHYSICAL EXAMINATION: ECOG PERFORMANCE STATUS: 1 - Symptomatic but completely ambulatory  Vitals:   01/26/22 1439  BP: 107/69  Pulse: 80  Resp: 18  Temp: (!) 97.1 F (36.2 C)   Filed Weights   01/26/22 1439  Weight: 168 lb 14.4 oz (76.6 kg)   Physical Exam Vitals reviewed.  Constitutional:      Appearance: She is not ill-appearing.  HENT:     Head: Normocephalic and atraumatic.     Mouth/Throat:     Mouth: Mucous membranes are dry.  Cardiovascular:     Rate and Rhythm: Normal rate and regular rhythm.  Pulmonary:     Comments: Decreased breath sounds bilaterally.  Abdominal:     General: There is no distension.     Palpations: Abdomen is soft.     Tenderness: There is no abdominal tenderness.  Skin:    General: Skin is warm.     Coloration: Skin is not pale.  Neurological:     General: No focal deficit present.     Mental Status: She is alert and oriented to person, place, and time.  Psychiatric:        Mood and Affect: Mood normal.        Behavior: Behavior normal.    LABORATORY DATA:  I have reviewed the data as listed Lab Results  Component Value Date   WBC 25.8 (H) 01/26/2022   HGB 10.6 (L) 01/26/2022   HCT 33.7 (L) 01/26/2022   MCV 100.9 (H) 01/26/2022   PLT 183 01/26/2022   Recent Labs    12/28/21 0517 01/19/22 0843 01/26/22 1421   NA 139 141 140  K 3.7 3.5 3.4*  CL 110 103 102  CO2 '23 27 26  '$ GLUCOSE 121* 155* 114*  BUN '17 9 12  '$ CREATININE 0.69 0.78 0.59  CALCIUM 8.4* 8.7* 8.7*  GFRNONAA >60 >60 >60  PROT 5.6* 6.8 6.7  ALBUMIN 2.8* 3.5 3.5  AST 65* 27 22  ALT 43 18 21  ALKPHOS 87 88 118  BILITOT 0.7 0.7 0.6     RADIOGRAPHIC STUDIES: I have personally reviewed the radiological images as listed and agreed with the findings in the report. No results found.  ASSESSMENT & PLAN:   No problem-specific Assessment & Plan notes found for this encounter.  Cancer of lower lobe of left lung ([place]) #Stage IV -SMALL CELL lung cancer [s/p liver Bx-; Previously on-small cell favor adeno, currently s/p cycle 2 of carboplatin etoposide plus Tecentriq every 3 weeks. Tolerating moderately.    # Chemotherapy induced nausea- proceed with IV fluids and IV zofran today.    # Bilateral rib pain- bone scan- OCT 2023- No scintigraphic evidence of bone metastasis.  ? Sec to liver disease. Patient requests to trial lidoderm patches which have helped in the past. Will send prescription for trial. Continue oxycodone. Right sciatica/radiates down the leg. Continue neurontin 100 mg  BID.     # COPD [Dr.A]/allergies likely secondary to poorly controlled COPD. Continue using albuterol 3-4 times a day and also compliance with Advair. Continue Singulair.    # Right eye vision changes- s/p eval at [facility]. Unchanged. Monitor.    #  Diabetes-A1c 6.9 [AUG 2022]-PBF- 164- Continue glipizide 5 mg BID STABLE;     # Cough secondary to radiation-currently on Tessalon Perles.    # Hypocalcemia: on Vit D BID [8.7; ca-kidney stones].  JUNE  Vit D levels-48.  STABLE;    # Mediport placement-s/p dye study c tPA-functioning. STABLE;   # Hypokalemia- K slightly decreased at 3.4. Increase K rich foods for now. If decreases again or persistently low, start oral potassium.    Dec 19th- cataract #2.    #DISPOSITION: Fluids and iv zofran today Follow up  as scheduled.  If symptoms don't improve or worsen in interim, notify clinic for reevaluation.   All questions were answered. The patient knows to call the clinic with any problems, questions or concerns.   Verlon Au, NP 01/26/2022

## 2022-01-26 NOTE — Patient Instructions (Signed)
[facility] CANCER CTR AT [place]  Discharge Instructions: Thank you for choosing [facility] to provide your oncology and hematology care.  If you have a lab appointment with the [facility], please go directly to the [facility] and check in at the registration area.  Wear comfortable clothing and clothing appropriate for easy access to any Portacath or PICC line.   We strive to give you quality time with your provider. You may need to reschedule your appointment if you arrive late (15 or more minutes).  Arriving late affects you and other patients whose appointments are after yours.  Also, if you miss three or more appointments without notifying the office, you may be dismissed from the clinic at the provider's discretion.      For prescription refill requests, have your pharmacy contact our office and allow 72 hours for refills to be completed.    Today you received the following chemotherapy and/or immunotherapy agents HYDRATION AND ZOFRAN      To help prevent nausea and vomiting after your treatment, we encourage you to take your nausea medication as directed.  BELOW ARE SYMPTOMS THAT SHOULD BE REPORTED IMMEDIATELY: *FEVER GREATER THAN 100.4 F (38 C) OR HIGHER *CHILLS OR SWEATING *NAUSEA AND VOMITING THAT IS NOT CONTROLLED WITH YOUR NAUSEA MEDICATION *UNUSUAL SHORTNESS OF BREATH *UNUSUAL BRUISING OR BLEEDING *URINARY PROBLEMS (pain or burning when urinating, or frequent urination) *BOWEL PROBLEMS (unusual diarrhea, constipation, pain near the anus) TENDERNESS IN MOUTH AND THROAT WITH OR WITHOUT PRESENCE OF ULCERS (sore throat, sores in mouth, or a toothache) UNUSUAL RASH, SWELLING OR PAIN  UNUSUAL VAGINAL DISCHARGE OR ITCHING   Items with * indicate a potential emergency and should be followed up as soon as possible or go to the Emergency Department if any problems should occur.  Please show the CHEMOTHERAPY ALERT CARD or IMMUNOTHERAPY ALERT CARD at  check-in to the Emergency Department and triage nurse.  Should you have questions after your visit or need to cancel or reschedule your appointment, please contact [facility] CANCER [place] AT [place]  [phone] and follow the prompts.  Office hours are 8:00 a.m. to 4:30 p.m. Monday - Friday. Please note that voicemails left after 4:00 p.m. may not be returned until the following business day.  We are closed weekends and major holidays. You have access to a nurse at all times for urgent questions. Please call the main number to the clinic [phone] and follow the prompts.  For any non-urgent questions, you may also contact your provider using MyChart. We now offer e-Visits for anyone 76 and older to request care online for non-urgent symptoms. For details visit [email or web address].   Also download the MyChart app! Go to the app store, search "MyChart", open the app, select [facility], and log in with your MyChart username and password.  Masks are optional in the cancer centers. If you would like for your care team to wear a mask while they are taking care of you, please let them know. For doctor visits, patients may have with them one support person who is at least 64 years old. At this time, visitors are not allowed in the infusion area.

## 2022-01-28 ENCOUNTER — Encounter: Payer: Self-pay | Admitting: Ophthalmology

## 2022-01-28 ENCOUNTER — Encounter: Payer: Self-pay | Admitting: Internal Medicine

## 2022-01-28 ENCOUNTER — Telehealth: Payer: Self-pay | Admitting: *Deleted

## 2022-01-28 NOTE — Discharge Instructions (Signed)

## 2022-01-28 NOTE — Telephone Encounter (Signed)
Clinton Bohle Key: [id number] -PA submitted for lidoderm patches  Outcome Approved today Your PA request has been approved

## 2022-01-29 ENCOUNTER — Encounter: Payer: Self-pay | Admitting: Internal Medicine

## 2022-01-29 ENCOUNTER — Other Ambulatory Visit: Payer: Self-pay | Admitting: *Deleted

## 2022-01-29 MED ORDER — OXYCODONE HCL 5 MG PO TABS: 5.0000 mg | ORAL_TABLET | ORAL | 0 refills | Status: DC | PRN

## 2022-02-02 ENCOUNTER — Encounter: Payer: Self-pay | Admitting: Ophthalmology

## 2022-02-02 ENCOUNTER — Ambulatory Visit: Payer: 59 | Admitting: Anesthesiology

## 2022-02-02 ENCOUNTER — Ambulatory Visit
Admission: RE | Admit: 2022-02-02 | Discharge: 2022-02-02 | Disposition: A | Discharge status: Home / Self Care | Payer: 59 | Attending: Ophthalmology | Admitting: Ophthalmology

## 2022-02-02 ENCOUNTER — Other Ambulatory Visit: Payer: Medicaid Other

## 2022-02-02 ENCOUNTER — Other Ambulatory Visit: Payer: Self-pay

## 2022-02-02 ENCOUNTER — Encounter
Admission: RE | Disposition: A | Discharge status: Home / Self Care | Payer: Self-pay | Source: Home / Self Care | Attending: Ophthalmology

## 2022-02-02 DIAGNOSIS — I10 Essential (primary) hypertension: Secondary | ICD-10-CM | POA: Diagnosis not present

## 2022-02-02 DIAGNOSIS — E039 Hypothyroidism, unspecified: Secondary | ICD-10-CM | POA: Diagnosis not present

## 2022-02-02 DIAGNOSIS — B1920 Unspecified viral hepatitis C without hepatic coma: Secondary | ICD-10-CM | POA: Diagnosis not present

## 2022-02-02 DIAGNOSIS — Z87891 Personal history of nicotine dependence: Secondary | ICD-10-CM | POA: Diagnosis not present

## 2022-02-02 DIAGNOSIS — E1136 Type 2 diabetes mellitus with diabetic cataract: Secondary | ICD-10-CM | POA: Insufficient documentation

## 2022-02-02 DIAGNOSIS — H2512 Age-related nuclear cataract, left eye: Secondary | ICD-10-CM | POA: Diagnosis present

## 2022-02-02 DIAGNOSIS — K219 Gastro-esophageal reflux disease without esophagitis: Secondary | ICD-10-CM | POA: Insufficient documentation

## 2022-02-02 DIAGNOSIS — Z7951 Long term (current) use of inhaled steroids: Secondary | ICD-10-CM | POA: Insufficient documentation

## 2022-02-02 DIAGNOSIS — E785 Hyperlipidemia, unspecified: Secondary | ICD-10-CM | POA: Insufficient documentation

## 2022-02-02 DIAGNOSIS — Z79890 Hormone replacement therapy (postmenopausal): Secondary | ICD-10-CM | POA: Insufficient documentation

## 2022-02-02 DIAGNOSIS — Z85850 Personal history of malignant neoplasm of thyroid: Secondary | ICD-10-CM | POA: Diagnosis not present

## 2022-02-02 DIAGNOSIS — Z7984 Long term (current) use of oral hypoglycemic drugs: Secondary | ICD-10-CM | POA: Insufficient documentation

## 2022-02-02 DIAGNOSIS — C3490 Malignant neoplasm of unspecified part of unspecified bronchus or lung: Secondary | ICD-10-CM | POA: Insufficient documentation

## 2022-02-02 DIAGNOSIS — C787 Secondary malignant neoplasm of liver and intrahepatic bile duct: Secondary | ICD-10-CM | POA: Diagnosis not present

## 2022-02-02 DIAGNOSIS — J449 Chronic obstructive pulmonary disease, unspecified: Secondary | ICD-10-CM | POA: Diagnosis not present

## 2022-02-02 DIAGNOSIS — Z79899 Other long term (current) drug therapy: Secondary | ICD-10-CM | POA: Insufficient documentation

## 2022-02-02 HISTORY — PX: CATARACT EXTRACTION W/PHACO: SHX586

## 2022-02-02 LAB — GLUCOSE, CAPILLARY: Glucose-Capillary: 117 mg/dL — ABNORMAL HIGH (ref 70–99)

## 2022-02-02 SURGERY — PHACOEMULSIFICATION, CATARACT, WITH IOL INSERTION
Anesthesia: Monitor Anesthesia Care | Site: Eye | Laterality: Left

## 2022-02-02 MED ORDER — SIGHTPATH DOSE#1 NA CHONDROIT SULF-NA HYALURON 40-17 MG/ML IO SOLN
INTRAOCULAR | Status: DC | PRN
  Administered 2022-02-02: 1 mL via INTRAOCULAR

## 2022-02-02 MED ORDER — MIDAZOLAM HCL 2 MG/2ML IJ SOLN
INTRAMUSCULAR | Status: DC | PRN
  Administered 2022-02-02: 1 mg via INTRAVENOUS

## 2022-02-02 MED ORDER — SIGHTPATH DOSE#1 BSS IO SOLN
INTRAOCULAR | Status: DC | PRN
  Administered 2022-02-02: 43 mL via OPHTHALMIC

## 2022-02-02 MED ORDER — SIGHTPATH DOSE#1 BSS IO SOLN
INTRAOCULAR | Status: DC | PRN
  Administered 2022-02-02: 1 mL via INTRAMUSCULAR

## 2022-02-02 MED ORDER — LACTATED RINGERS IV SOLN: INTRAVENOUS | Status: DC

## 2022-02-02 MED ORDER — SIGHTPATH DOSE#1 BSS IO SOLN
INTRAOCULAR | Status: DC | PRN
  Administered 2022-02-02: 15 mL

## 2022-02-02 MED ORDER — ARMC OPHTHALMIC DILATING DROPS
1.0000 | OPHTHALMIC | Status: DC | PRN
  Administered 2022-02-02 (×3): 1 via OPHTHALMIC

## 2022-02-02 MED ORDER — TETRACAINE HCL 0.5 % OP SOLN
1.0000 [drp] | OPHTHALMIC | Status: DC | PRN
  Administered 2022-02-02 (×3): 1 [drp] via OPHTHALMIC

## 2022-02-02 MED ORDER — FENTANYL CITRATE (PF) 100 MCG/2ML IJ SOLN
INTRAMUSCULAR | Status: DC | PRN
  Administered 2022-02-02: 50 ug via INTRAVENOUS

## 2022-02-02 MED ORDER — MOXIFLOXACIN HCL 0.5 % OP SOLN
OPHTHALMIC | Status: DC | PRN
  Administered 2022-02-02: .2 mL via OPHTHALMIC

## 2022-02-02 MED ORDER — BRIMONIDINE TARTRATE-TIMOLOL 0.2-0.5 % OP SOLN
OPHTHALMIC | Status: DC | PRN
  Administered 2022-02-02: 1 [drp] via OPHTHALMIC

## 2022-02-02 SURGICAL SUPPLY — 10 items
CATARACT SUITE SIGHTPATH (MISCELLANEOUS) ×1 IMPLANT
FEE CATARACT SUITE SIGHTPATH (MISCELLANEOUS) ×1 IMPLANT
GLOVE SURG ENC TEXT LTX SZ8 (GLOVE) ×1 IMPLANT
GLOVE SURG TRIUMPH 8.0 PF LTX (GLOVE) ×1 IMPLANT
LENS IOL TECNIS EYHANCE 34.0 (Intraocular Lens) IMPLANT
LENS TECNIS EYHANCE MONO 34.0 (Intraocular Lens) ×1 IMPLANT
NDL FILTER BLUNT 18X1 1/2 (NEEDLE) ×1 IMPLANT
NEEDLE FILTER BLUNT 18X1 1/2 (NEEDLE) ×1 IMPLANT
SYR 3ML LL SCALE MARK (SYRINGE) ×1 IMPLANT
WATER STERILE IRR 250ML POUR (IV SOLUTION) ×1 IMPLANT

## 2022-02-02 NOTE — H&P (Signed)
[facility]   Primary Care Physician:  Dion Body, MD Ophthalmologist: Dr. Hortense Ramal  Pre-Procedure History & Physical: HPI:  Felicia Manning is a 66 y.o. female here for cataract surgery.   Past Medical History:  Diagnosis Date   Barrett's esophagus    Cancer associated pain    COPD (chronic obstructive pulmonary disease) (HCC)    Dysrhythmia    stress related at work.   GERD (gastroesophageal reflux disease)    H/O malignant neoplasm of thyroid 01/29/2014   Hepatitis C 2008   Harvoni 2017   History of kidney stones    Hyperlipidemia    Hypertension    Hypothyroidism    Liver cancer ([place])    metastasized from Lung   Nephrolithiasis    Non-small cell carcinoma of left lung ([place])    Osteoporosis    Pulmonary mass    Thyroid cancer ([place]) 2012   Thyroid cancer ([place])    Tumor    tumor per pt back of rt eye   Type 2 diabetes mellitus ([place])    Wears dentures    Full upper and lower.  Usually only wears upper    Past Surgical History:  Procedure Laterality Date   ABDOMINAL HYSTERECTOMY  2006   CATARACT EXTRACTION W/PHACO Right 12/08/2021   Procedure: CATARACT EXTRACTION PHACO AND INTRAOCULAR LENS PLACEMENT (IOC) RIGHT DIABETIC 6.62 00:35.1;  Surgeon: Birder Robson, MD;  Location: [facility];  Service: Ophthalmology;  Laterality: Right;  Diabetic   COLONOSCOPY WITH PROPOFOL N/A 04/25/2015   Procedure: COLONOSCOPY WITH PROPOFOL;  Surgeon: Josefine Class, MD;  Location: [facility] ENDOSCOPY;  Service: Endoscopy;  Laterality: N/A;   CYSTOSCOPY W/ RETROGRADES Bilateral 05/19/2015   Procedure: CYSTOSCOPY WITH RETROGRADE PYELOGRAM;  Surgeon: Hollice Espy, MD;  Location: [facility] ORS;  Service: Urology;  Laterality: Bilateral;   CYSTOSCOPY W/ URETERAL STENT PLACEMENT Right 05/26/2015   Procedure: CYSTOSCOPY WITH STENT REPLACEMENT;  Surgeon: Hollice Espy, MD;  Location: [facility] ORS;  Service: Urology;  Laterality: Right;   CYSTOSCOPY WITH STENT PLACEMENT Right  05/19/2015   Procedure: CYSTOSCOPY WITH STENT PLACEMENT;  Surgeon: Hollice Espy, MD;  Location: [facility] ORS;  Service: Urology;  Laterality: Right;   CYSTOSCOPY WITH STENT PLACEMENT Right 05/26/2015   Procedure: CYSTOSCOPY WITH STENT PLACEMENT;  Surgeon: Hollice Espy, MD;  Location: [facility] ORS;  Service: Urology;  Laterality: Right;   CYSTOSCOPY WITH STENT PLACEMENT Right 06/25/2016   Procedure: CYSTOSCOPY WITH STENT PLACEMENT;  Surgeon: Nickie Retort, MD;  Location: [facility] ORS;  Service: Urology;  Laterality: Right;   CYSTOSCOPY WITH STENT PLACEMENT Left 10/31/2017   Procedure: CYSTOSCOPY WITH STENT PLACEMENT;  Surgeon: Hollice Espy, MD;  Location: [facility] ORS;  Service: Urology;  Laterality: Left;   CYSTOSCOPY/URETEROSCOPY/HOLMIUM LASER/STENT PLACEMENT Left 07/23/2015   Procedure: CYSTOSCOPY/URETEROSCOPY/HOLMIUM LASER/STENT PLACEMENT/RETROGRADE PYELOGRAM;  Surgeon: Hollice Espy, MD;  Location: [facility] ORS;  Service: Urology;  Laterality: Left;   CYSTOSCOPY/URETEROSCOPY/HOLMIUM LASER/STENT PLACEMENT Left 11/16/2017   Procedure: CYSTOSCOPY/URETEROSCOPY/HOLMIUM LASER/STENT Exchange;  Surgeon: Hollice Espy, MD;  Location: [facility] ORS;  Service: Urology;  Laterality: Left;   ESOPHAGOGASTRODUODENOSCOPY (EGD) WITH PROPOFOL N/A 04/25/2015   Procedure: ESOPHAGOGASTRODUODENOSCOPY (EGD) WITH PROPOFOL;  Surgeon: Josefine Class, MD;  Location: [facility] ENDOSCOPY;  Service: Endoscopy;  Laterality: N/A;   EYE SURGERY Bilateral 1964   lazy eye repair   EYE SURGERY Bilateral 2001   lazy eye repair   IR IMAGING GUIDED PORT INSERTION  12/08/2020   LITHOTRIPSY  2009   THYROIDECTOMY  2010   URETEROSCOPY WITH HOLMIUM LASER LITHOTRIPSY  Right 05/19/2015   Procedure: URETEROSCOPY WITH HOLMIUM LASER LITHOTRIPSY;  Surgeon: Hollice Espy, MD;  Location: [facility] ORS;  Service: Urology;  Laterality: Right;   URETEROSCOPY WITH HOLMIUM LASER LITHOTRIPSY Right 05/26/2015   Procedure: URETEROSCOPY WITH HOLMIUM LASER LITHOTRIPSY;   Surgeon: Hollice Espy, MD;  Location: [facility] ORS;  Service: Urology;  Laterality: Right;   URETEROSCOPY WITH HOLMIUM LASER LITHOTRIPSY Right 06/25/2016   Procedure: URETEROSCOPY WITH HOLMIUM LASER LITHOTRIPSY;  Surgeon: Nickie Retort, MD;  Location: [facility] ORS;  Service: Urology;  Laterality: Right;    Prior to Admission medications   Medication Sig Start Date End Date Taking? Authorizing Provider  atorvastatin (LIPITOR) 80 MG tablet Take 80 mg by mouth at bedtime.   Yes [provider]  benzonatate (TESSALON PERLES) 100 MG capsule Take 1 capsule (100 mg total) by mouth 3 (three) times daily as needed for cough. 09/16/21  Yes Cammie Sickle, MD  buPROPion (WELLBUTRIN SR) 150 MG 12 hr tablet Take 150 mg by mouth daily.   Yes [provider]  cholecalciferol (VITAMIN D) 1000 units tablet Take 2,000 Units by mouth daily.   Yes [provider]  Cranberry-Vitamin C-Probiotic (AZO CRANBERRY PO) Take 1 tablet by mouth daily.   Yes [provider]  folic acid (FOLVITE) 1 MG tablet Take 1 tablet (1 mg total) by mouth daily. 11/18/21  Yes Cammie Sickle, MD  gabapentin (NEURONTIN) 100 MG capsule Take 1 capsule (100 mg total) by mouth 2 (two) times daily. 12/24/21  Yes Covington, Sarah M, PA-C  glipiZIDE (GLUCOTROL) 5 MG tablet Take 1 tablet (5 mg total) by mouth 2 (two) times daily before a meal. 11/18/21  Yes Cammie Sickle, MD  levothyroxine (SYNTHROID) 175 MCG tablet Take 1 tablet (175 mcg total) by mouth daily before breakfast. Patient taking differently: Take 175 mcg by mouth daily before breakfast. 150 mcg Mon -Fri.  175 mcg Sat and Sun 12/29/21 03/29/22 Yes Richarda Osmond, MD  lidocaine (LIDODERM) 5 % Place 1 patch onto the skin daily. 01/26/22  Yes Verlon Au, NP  metoprolol succinate (TOPROL-XL) 25 MG 24 hr tablet Take 25 mg by mouth every morning.    Yes [provider]  montelukast (SINGULAIR) 10 MG tablet TAKE 1 TABLET BY  MOUTH AT BEDTIME 01/11/22  Yes Cammie Sickle, MD  omeprazole (PRILOSEC) 20 MG capsule Take 20 mg by mouth daily before breakfast.    Yes [provider]  oxyCODONE (OXY IR/ROXICODONE) 5 MG immediate release tablet Take 1 tablet (5 mg total) by mouth every 4 (four) hours as needed for severe pain. 01/29/22  Yes Borders, Kirt Boys, NP  Accu-Chek Softclix Lancets lancets Check glucose once a day 01/29/21   Verlon Au, NP  albuterol (VENTOLIN HFA) 108 (90 Base) MCG/ACT inhaler Inhale 2 puffs into the lungs every 6 (six) hours as needed for wheezing or shortness of breath. Patient not taking: Reported on 01/26/2022 05/12/21   Borders, Kirt Boys, NP  ARTIFICIAL TEAR OP Apply to eye.    [provider]  blood glucose meter kit and supplies KIT Dispense based on patient and insurance preference. Use up to three times daily as directed. 12/10/20   Cammie Sickle, MD  fluticasone-salmeterol (ADVAIR HFA) [id number] MCG/ACT inhaler Inhale 2 puffs into the lungs 2 (two) times daily. Patient not taking: Reported on 01/26/2022 01/28/21   Borders, Kirt Boys, NP  lidocaine-prilocaine (EMLA) cream Apply 1 application topically as needed. 12/02/20   Cammie Sickle,  MD  loperamide (IMODIUM) 2 MG capsule Take by mouth as needed for diarrhea or loose stools. Patient not taking: Reported on 01/26/2022    [provider]  losartan (COZAAR) 100 MG tablet Take 100 mg by mouth every morning.  Patient not taking: Reported on 01/28/2022    [provider]  ondansetron (ZOFRAN) 8 MG tablet Take 1 tablet (8 mg total) by mouth every 8 (eight) hours as needed for nausea or vomiting. Start on the third day after chemotherapy Patient not taking: Reported on 01/26/2022 04/28/21   Borders, Kirt Boys, NP  prochlorperazine (COMPAZINE) 10 MG tablet Take 1 tablet (10 mg total) by mouth every 6 (six) hours as needed for nausea or vomiting. Patient not taking: Reported on 01/26/2022  04/28/21   Borders, Kirt Boys, NP    Allergies as of 10/27/2021   (No Known Allergies)    Family History  Problem Relation Age of Onset   CAD Mother    Heart disease Mother    Dementia Father    Bladder Cancer Neg Hx    Prostate cancer Neg Hx    Kidney cancer Neg Hx    Breast cancer Neg Hx     Social History   Socioeconomic History   Marital status: Divorced    Spouse name: Not on file   Number of children: Not on file   Years of education: Not on file   Highest education level: Not on file  Occupational History   Not on file  Tobacco Use   Smoking status: Former    Packs/day: 1.00    Years: 50.00    Total pack years: 50.00    Types: Cigarettes    Quit date: 08/15/2021    Years since quitting: 0.4   Smokeless tobacco: Never   Tobacco comments:    Started smoking age 33  Vaping Use   Vaping Use: Former  Substance and Sexual Activity   Alcohol use: Not Currently    Comment: socially   Drug use: No   Sexual activity: Not Currently    Birth control/protection: None  Other Topics Concern   Not on file  Social History Narrative   Smoking: 1p 1-2 days since 17 years; alcohol: rare; 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     Social Determinants of Health   Financial Resource Strain: Not on file  Food Insecurity: No Food Insecurity (12/27/2021)   Hunger Vital Sign    Worried About Running Out of Food in the Last Year: Never true    Ran Out of Food in the Last Year: Never true  Transportation Needs: No Transportation Needs (12/27/2021)   PRAPARE - Hydrologist (Medical): No    Lack of Transportation (Non-Medical): No  Recent Concern: Transportation Needs - Unmet Transportation Needs (10/02/2021)   PRAPARE - Hydrologist (Medical): Yes    Lack of Transportation (Non-Medical): Yes  Physical Activity: Not on file  Stress: Not on file   Social Connections: Not on file  Intimate Partner Violence: Not At Risk (12/27/2021)   Humiliation, Afraid, Rape, and Kick questionnaire    Fear of Current or Ex-Partner: No    Emotionally Abused: No    Physically Abused: No    Sexually Abused: No    Review of Systems: See HPI, otherwise negative ROS  Physical Exam: BP 138/76   Temp (!) 97.3 F (36.3 C) (Tympanic)   Ht  5' 7.01" (1.702 m)   Wt 76.9 kg   SpO2 97%   BMI 26.54 kg/m  General:   Alert, cooperative in NAD Head:  Normocephalic and atraumatic. Respiratory:  Normal work of breathing. Cardiovascular:  RRR  Impression/Plan: Felicia Manning is here for cataract surgery.  Risks, benefits, limitations, and alternatives regarding cataract surgery have been reviewed with the patient.  Questions have been answered.  All parties agreeable.   Birder Robson, MD  02/02/2022, 8:26 AM

## 2022-02-02 NOTE — Transfer of Care (Signed)
Immediate Anesthesia Transfer of Care Note  Patient: [name]  Procedure(s) Performed: CATARACT EXTRACTION PHACO AND INTRAOCULAR LENS PLACEMENT (IOC) LEFT DIABETIC  5.33  00:30.3 (Left: Eye)  Patient Location: PACU  Anesthesia Type: MAC  Level of Consciousness: awake, alert  and patient cooperative  Airway and Oxygen Therapy: Patient Spontanous Breathing and Patient connected to supplemental oxygen  Post-op Assessment: Post-op Vital signs reviewed, Patient's Cardiovascular Status Stable, Respiratory Function Stable, Patent Airway and No signs of Nausea or vomiting  Post-op Vital Signs: Reviewed and stable  Complications: No notable events documented.

## 2022-02-02 NOTE — Anesthesia Postprocedure Evaluation (Signed)
Anesthesia Post Note  Patient: [name]  Procedure(s) Performed: CATARACT EXTRACTION PHACO AND INTRAOCULAR LENS PLACEMENT (IOC) LEFT DIABETIC  5.33  00:30.3 (Left: Eye)  Patient location during evaluation: PACU Anesthesia Type: MAC Level of consciousness: awake and alert Pain management: pain level controlled Vital Signs Assessment: post-procedure vital signs reviewed and stable Respiratory status: spontaneous breathing, nonlabored ventilation, respiratory function stable and patient connected to nasal cannula oxygen Cardiovascular status: stable and blood pressure returned to baseline Postop Assessment: no apparent nausea or vomiting Anesthetic complications: no   No notable events documented.   Last Vitals:  Vitals:   02/02/22 0851 02/02/22 0855  BP:  123/76  Pulse: 90 92  Resp: 16 (!) 26  Temp:    SpO2: 94% 93%    Last Pain:  Vitals:   02/02/22 0855  TempSrc:   PainSc: 0-No pain                 Martha Clan

## 2022-02-02 NOTE — Anesthesia Preprocedure Evaluation (Signed)
Anesthesia Evaluation  Patient identified by MRN, date of birth, ID band Patient awake    Reviewed: Allergy & Precautions, NPO status , Patient's Chart, lab work & pertinent test results  History of Anesthesia Complications Negative for: history of anesthetic complications  Airway Mallampati: III  TM Distance: >3 FB Neck ROM: full    Dental  (+) Chipped, Dental Advidsory Given   Pulmonary neg shortness of breath, neg sleep apnea, COPD,  COPD inhaler, former smoker   Pulmonary exam normal breath sounds clear to auscultation- rhonchi (-) wheezing      Cardiovascular Exercise Tolerance: Good hypertension, Pt. on medications (-) CAD, (-) Past MI, (-) Cardiac Stents and (-) CABG negative cardio ROS Normal cardiovascular exam Rhythm:Regular Rate:Normal - Systolic murmurs and - Diastolic murmurs    Neuro/Psych negative neurological ROS  negative psych ROS   GI/Hepatic negative GI ROS,,,(+) Hepatitis -, C  Endo/Other  diabetesHypothyroidism    Renal/GU Renal disease (nephrolithiasis)     Musculoskeletal negative musculoskeletal ROS (+)    Abdominal  (+) - obese  Peds  Hematology negative hematology ROS (+)   Anesthesia Other Findings Patient with stage 4 lung cancer that was diagnosed this month. Patient states she is breathing well and with no problems. She has an inhaler that she uses everyday. Denies any shortness of breath. States that she is coughing a lot when she lays flat. Discussed case with surgeon and notified him of coughing.   Past Medical History: No date: Barrett's esophagus No date: COPD (chronic obstructive pulmonary disease) (HCC) No date: Dysrhythmia     Comment:  stress related at work. No date: GERD (gastroesophageal reflux disease) 2008: Hepatitis C     Comment:  Harvoni 2017 No date: History of kidney stones No date: Hyperlipidemia No date: Hypertension No date: Hypothyroidism No date: Liver  cancer [facility])     Comment:  metastasized from Lung No date: Nephrolithiasis No date: Non-small cell carcinoma of left lung (HCC) No date: Osteoporosis No date: Pulmonary mass 2012: Thyroid cancer ([place]) No date: Thyroid cancer ([place]) No date: Tumor     Comment:  tumor per pt back of rt eye No date: Type 2 diabetes mellitus ([place]) No date: Wears dentures     Comment:  Full upper and lower.  Usually only wears upper  Past Surgical History: 2006: ABDOMINAL HYSTERECTOMY 04/25/2015: COLONOSCOPY WITH PROPOFOL; N/A     Comment:  Procedure: COLONOSCOPY WITH PROPOFOL;  Surgeon: Josefine Class, MD;  Location: [facility] ENDOSCOPY;  Service:               Endoscopy;  Laterality: N/A; 05/19/2015: CYSTOSCOPY W/ RETROGRADES; Bilateral     Comment:  Procedure: CYSTOSCOPY WITH RETROGRADE PYELOGRAM;                Surgeon: Hollice Espy, MD;  Location: [facility] ORS;                Service: Urology;  Laterality: Bilateral; 05/26/2015: CYSTOSCOPY W/ URETERAL STENT PLACEMENT; Right     Comment:  Procedure: CYSTOSCOPY WITH STENT REPLACEMENT;  Surgeon:               Hollice Espy, MD;  Location: [facility] ORS;  Service:               Urology;  Laterality: Right; 05/19/2015: CYSTOSCOPY WITH STENT PLACEMENT; Right     Comment:  Procedure: CYSTOSCOPY WITH STENT PLACEMENT;  Surgeon:  Hollice Espy, MD;  Location: [facility] ORS;  Service:               Urology;  Laterality: Right; 05/26/2015: CYSTOSCOPY WITH STENT PLACEMENT; Right     Comment:  Procedure: CYSTOSCOPY WITH STENT PLACEMENT;  Surgeon:               Hollice Espy, MD;  Location: [facility] ORS;  Service:               Urology;  Laterality: Right; 06/25/2016: CYSTOSCOPY WITH STENT PLACEMENT; Right     Comment:  Procedure: [place];  Surgeon:               Nickie Retort, MD;  Location: [facility] ORS;  Service:               Urology;  Laterality: Right; 10/31/2017: CYSTOSCOPY WITH STENT PLACEMENT; Left     Comment:   Procedure: CYSTOSCOPY WITH STENT PLACEMENT;  Surgeon:               Hollice Espy, MD;  Location: [facility] ORS;  Service:               Urology;  Laterality: Left; 07/23/2015: CYSTOSCOPY/URETEROSCOPY/HOLMIUM LASER/STENT PLACEMENT; Left     Comment:  Procedure: CYSTOSCOPY/URETEROSCOPY/HOLMIUM LASER/STENT               PLACEMENT/RETROGRADE PYELOGRAM;  Surgeon: Hollice Espy,              MD;  Location: [facility] ORS;  Service: Urology;  Laterality:               Left; 11/16/2017: CYSTOSCOPY/URETEROSCOPY/HOLMIUM LASER/STENT PLACEMENT; Left     Comment:  Procedure: CYSTOSCOPY/URETEROSCOPY/HOLMIUM LASER/STENT               Exchange;  Surgeon: Hollice Espy, MD;  Location: [facility]               ORS;  Service: Urology;  Laterality: Left; 04/25/2015: ESOPHAGOGASTRODUODENOSCOPY (EGD) WITH PROPOFOL; N/A     Comment:  Procedure: ESOPHAGOGASTRODUODENOSCOPY (EGD) WITH               PROPOFOL;  Surgeon: Josefine Class, MD;  Location:               [facility] ENDOSCOPY;  Service: Endoscopy;  Laterality: N/A; 1964: EYE SURGERY; Bilateral     Comment:  lazy eye repair 2001: EYE SURGERY; Bilateral     Comment:  lazy eye repair 12/08/2020: IR IMAGING GUIDED PORT INSERTION 2009: LITHOTRIPSY 2010: THYROIDECTOMY 05/19/2015: URETEROSCOPY WITH HOLMIUM LASER LITHOTRIPSY; Right     Comment:  Procedure: URETEROSCOPY WITH HOLMIUM LASER LITHOTRIPSY;               Surgeon: Hollice Espy, MD;  Location: [facility] ORS;                Service: Urology;  Laterality: Right; 05/26/2015: URETEROSCOPY WITH HOLMIUM LASER LITHOTRIPSY; Right     Comment:  Procedure: URETEROSCOPY WITH HOLMIUM LASER LITHOTRIPSY;               Surgeon: Hollice Espy, MD;  Location: [facility] ORS;                Service: Urology;  Laterality: Right; 06/25/2016: URETEROSCOPY WITH HOLMIUM LASER LITHOTRIPSY; Right     Comment:  Procedure: URETEROSCOPY WITH HOLMIUM LASER LITHOTRIPSY;               Surgeon: Nickie Retort, MD;  Location: [facility] ORS;  Service: Urology;  Laterality: Right;  BMI    Body Mass Index: 27.88 kg/m      Reproductive/Obstetrics negative OB ROS                              Anesthesia Physical Anesthesia Plan  ASA: 4  Anesthesia Plan: MAC   Post-op Pain Management:    Induction: Intravenous  PONV Risk Score and Plan: 1 and Ondansetron and Midazolam  Airway Management Planned: Natural Airway and Nasal Cannula  Additional Equipment:   Intra-op Plan:   Post-operative Plan: Extubation in OR  Informed Consent: I have reviewed the patients History and Physical, chart, labs and discussed the procedure including the risks, benefits and alternatives for the proposed anesthesia with the patient or authorized representative who has indicated his/her understanding and acceptance.     Dental Advisory Given  Plan Discussed with: Anesthesiologist, CRNA and Surgeon  Anesthesia Plan Comments: (Patient consented for risks of anesthesia including but not limited to:  - adverse reactions to medications - damage to eyes, teeth, lips or other oral mucosa - nerve damage due to positioning  - sore throat or hoarseness - Damage to heart, brain, nerves, lungs, other parts of body or loss of life  Patient voiced understanding.)         Anesthesia Quick Evaluation

## 2022-02-02 NOTE — Op Note (Signed)
PREOPERATIVE DIAGNOSIS:  Nuclear sclerotic cataract of the left eye.   POSTOPERATIVE DIAGNOSIS:  Nuclear sclerotic cataract of the left eye.   OPERATIVE PROCEDURE:ORPROCALL@   SURGEON:  Birder Robson, MD.   ANESTHESIA:  Anesthesiologist: Martha Clan, MD CRNA: Tobie Poet, CRNA  1.      Managed anesthesia care. 2.     0.66ml of Shugarcaine was instilled following the paracentesis   COMPLICATIONS:  None.   TECHNIQUE:   Stop and chop   DESCRIPTION OF PROCEDURE:  The patient was examined and consented in the preoperative holding area where the aforementioned topical anesthesia was applied to the left eye and then brought back to the Operating Room where the left eye was prepped and draped in the usual sterile ophthalmic fashion and a lid speculum was placed. A paracentesis was created with the side port blade and the anterior chamber was filled with viscoelastic. A near clear corneal incision was performed with the steel keratome. A continuous curvilinear capsulorrhexis was performed with a cystotome followed by the capsulorrhexis forceps. Hydrodissection and hydrodelineation were carried out with BSS on a blunt cannula. The lens was removed in a stop and chop  technique and the remaining cortical material was removed with the irrigation-aspiration handpiece. The capsular bag was inflated with viscoelastic and the Technis ZCB00 lens was placed in the capsular bag without complication. The remaining viscoelastic was removed from the eye with the irrigation-aspiration handpiece. The wounds were hydrated. The anterior chamber was flushed with BSS and the eye was inflated to physiologic pressure. 0.2ml Vigamox was placed in the anterior chamber. The wounds were found to be water tight. The eye was dressed with Combigan. The patient was given protective glasses to wear throughout the day and a shield with which to sleep tonight. The patient was also given drops with which to begin a drop  regimen today and will follow-up with me in one day. Implant Name Type Inv. Item Serial No. Manufacturer Lot No. LRB No. Used Action  LENS TECNIS EYHANCE MONO 34.0 - [id number] Intraocular Lens LENS TECNIS EYHANCE MONO 34.0 [id number] [employer]  Left 1 Implanted    Procedure(s) with comments: CATARACT EXTRACTION PHACO AND INTRAOCULAR LENS PLACEMENT (IOC) LEFT DIABETIC  5.33  00:30.3 (Left) - Diabetic  Electronically signed: Birder Robson 02/02/2022 8:48 AM

## 2022-02-03 ENCOUNTER — Encounter: Payer: Self-pay | Admitting: Ophthalmology

## 2022-02-05 ENCOUNTER — Encounter: Payer: Self-pay | Admitting: Internal Medicine

## 2022-02-05 ENCOUNTER — Other Ambulatory Visit: Payer: Medicaid Other

## 2022-02-05 VITALS — BP 150/78 | HR 91 | Temp 97.6°F

## 2022-02-05 DIAGNOSIS — Z515 Encounter for palliative care: Secondary | ICD-10-CM

## 2022-02-05 MED FILL — Dexamethasone Sodium Phosphate Inj 100 MG/10ML: INTRAMUSCULAR | Qty: 1 | Status: AC

## 2022-02-05 MED FILL — Fosaprepitant Dimeglumine For IV Infusion 150 MG (Base Eq): INTRAVENOUS | Qty: 5 | Status: AC

## 2022-02-05 NOTE — Progress Notes (Signed)
PATIENT NAME: Felicia Manning DOB: [birth year] MRN: [id number]  PRIMARY CARE PROVIDER: Dion Body, MD  RESPONSIBLE PARTY:  Acct ID - Guarantor Home Phone Work Phone Relationship Acct Type  [id number] [name], [name] [phone]  Self P/F     01-May-2011 Felicia Manning, [place], [place] [address]    Grief:  Patient shared her mother passed away on May 02, 2022.  She will be traveling to in the next couple of weeks for funeral planning. Allowed patient to share struggles within her family and her current cancer dx with treatment.  Patient talks about her mental health and needing counseling.  Secure message sent to [place], SW to address counseling needs.   Home Health:  Currently has [facility] in place but this will likely be ending in the next couple of weeks per patient.   Pain:  Left lower rib cage.  Patient reports wiping counters this am and felt a popping in her left arm but now has pain to her left lower rib.  Patient reports its the same area of the rib fracture.  She is currently taking oxycodone to manage her pain.  Discussed current usage of oxycodone.  Patient advised she spoke with Palliative Care at the [facility] regarding a long acting pain medication.  She does not wish to do this yet as she is still driving.   She will continue to request refills through the cancer center.   Safety:  Patient noted recent fall.  Discussed life alert.  Patient will speak with her case manager at [employer].  Currently taking her phone upstairs in a waterproof case when she showers.     CODE STATUS: DNR in the home ADVANCED DIRECTIVES: Yes MOST FORM: Yes PPS: 50%   PHYSICAL EXAM:   VITALS: Today's Vitals   02/05/22 1124  BP: (!) 150/78  Pulse: 91  Temp: 97.6 F (36.4 C)  SpO2: 95%    LUNGS: clear to auscultation  CARDIAC: Cor RRR}  EXTREMITIES: - for edema SKIN: Skin color, texture, turgor normal. No rashes or lesions or mobility and turgor normal  NEURO: positive for  weakness       Lorenza Burton, RN

## 2022-02-09 ENCOUNTER — Inpatient Hospital Stay (HOSPITAL_BASED_OUTPATIENT_CLINIC_OR_DEPARTMENT_OTHER): Payer: 59 | Admitting: Internal Medicine

## 2022-02-09 ENCOUNTER — Inpatient Hospital Stay: Payer: 59

## 2022-02-09 ENCOUNTER — Encounter: Payer: Self-pay | Admitting: Internal Medicine

## 2022-02-09 VITALS — BP 130/90 | HR 96 | Temp 98.0°F | Resp 18 | Wt 171.0 lb

## 2022-02-09 DIAGNOSIS — C3432 Malignant neoplasm of lower lobe, left bronchus or lung: Secondary | ICD-10-CM

## 2022-02-09 DIAGNOSIS — Z5112 Encounter for antineoplastic immunotherapy: Secondary | ICD-10-CM | POA: Diagnosis not present

## 2022-02-09 LAB — COMPREHENSIVE METABOLIC PANEL
ALT: 16 U/L (ref 0–44)
AST: 24 U/L (ref 15–41)
Albumin: 3.4 g/dL — ABNORMAL LOW (ref 3.5–5.0)
Alkaline Phosphatase: 108 U/L (ref 38–126)
Anion gap: 6 (ref 5–15)
BUN: 8 mg/dL (ref 8–23)
CO2: 27 mmol/L (ref 22–32)
Calcium: 8.7 mg/dL — ABNORMAL LOW (ref 8.9–10.3)
Chloride: 107 mmol/L (ref 98–111)
Creatinine, Ser: 0.72 mg/dL (ref 0.44–1.00)
GFR, Estimated: >60 mL/min (ref >60)
Glucose, Bld: 165 mg/dL — ABNORMAL HIGH (ref 70–99)
Potassium: 3.5 mmol/L (ref 3.5–5.1)
Sodium: 140 mmol/L (ref 135–145)
Total Bilirubin: 0.3 mg/dL (ref 0.3–1.2)
Total Protein: 6.6 g/dL (ref 6.5–8.1)

## 2022-02-09 LAB — CBC WITH DIFFERENTIAL/PLATELET
Abs Immature Granulocytes: 0.18 10*3/uL — ABNORMAL HIGH (ref 0.00–0.07)
Basophils Absolute: 0.1 10*3/uL (ref 0.0–0.1)
Basophils Relative: 1 %
Eosinophils Absolute: 0.1 10*3/uL (ref 0.0–0.5)
Eosinophils Relative: 1 %
HCT: 34.4 % — ABNORMAL LOW (ref 36.0–46.0)
Hemoglobin: 10.7 g/dL — ABNORMAL LOW (ref 12.0–15.0)
Immature Granulocytes: 2 %
Lymphocytes Relative: 11 %
Lymphs Abs: 1.4 10*3/uL (ref 0.7–4.0)
MCH: 31.9 pg (ref 26.0–34.0)
MCHC: 31.1 g/dL (ref 30.0–36.0)
MCV: 102.7 fL — ABNORMAL HIGH (ref 80.0–100.0)
Monocytes Absolute: 1.2 10*3/uL — ABNORMAL HIGH (ref 0.1–1.0)
Monocytes Relative: 9 %
Neutro Abs: 9.4 10*3/uL — ABNORMAL HIGH (ref 1.7–7.7)
Neutrophils Relative %: 76 %
Platelets: 213 10*3/uL (ref 150–400)
RBC: 3.35 MIL/uL — ABNORMAL LOW (ref 3.87–5.11)
RDW: 18.6 % — ABNORMAL HIGH (ref 11.5–15.5)
WBC: 12.3 10*3/uL — ABNORMAL HIGH (ref 4.0–10.5)
nRBC: 0.2 % (ref 0.0–0.2)

## 2022-02-09 LAB — TSH: TSH: 3.84 u[IU]/mL (ref 0.350–4.500)

## 2022-02-09 MED ORDER — FAMOTIDINE IN NACL 20-0.9 MG/50ML-% IV SOLN
20.0000 mg | Freq: Once | INTRAVENOUS | Status: AC
  Administered 2022-02-09: 20 mg via INTRAVENOUS
  Filled 2022-02-09: qty 50

## 2022-02-09 MED ORDER — SODIUM CHLORIDE 0.9 % IV SOLN
80.0000 mg/m2 | Freq: Once | INTRAVENOUS | Status: AC
  Administered 2022-02-09: 160 mg via INTRAVENOUS
  Filled 2022-02-09: qty 8

## 2022-02-09 MED ORDER — SODIUM CHLORIDE 0.9 % IV SOLN
1200.0000 mg | Freq: Once | INTRAVENOUS | Status: AC
  Administered 2022-02-09: 1200 mg via INTRAVENOUS
  Filled 2022-02-09: qty 20

## 2022-02-09 MED ORDER — PALONOSETRON HCL INJECTION 0.25 MG/5ML
0.2500 mg | Freq: Once | INTRAVENOUS | Status: AC
  Administered 2022-02-09: 0.25 mg via INTRAVENOUS
  Filled 2022-02-09: qty 5

## 2022-02-09 MED ORDER — SODIUM CHLORIDE 0.9 % IV SOLN
10.0000 mg | Freq: Once | INTRAVENOUS | Status: AC
  Administered 2022-02-09: 10 mg via INTRAVENOUS
  Filled 2022-02-09: qty 10

## 2022-02-09 MED ORDER — GABAPENTIN 100 MG PO CAPS: 200.0000 mg | ORAL_CAPSULE | Freq: Two times a day (BID) | ORAL | 2 refills | Status: DC

## 2022-02-09 MED ORDER — HEPARIN SOD (PORK) LOCK FLUSH 100 UNIT/ML IV SOLN
500.0000 [IU] | Freq: Once | INTRAVENOUS | Status: AC | PRN
  Administered 2022-02-09: 500 [IU]
  Filled 2022-02-09: qty 5

## 2022-02-09 MED ORDER — SODIUM CHLORIDE 0.9 % IV SOLN
150.0000 mg | Freq: Once | INTRAVENOUS | Status: AC
  Administered 2022-02-09: 150 mg via INTRAVENOUS
  Filled 2022-02-09: qty 150

## 2022-02-09 MED ORDER — SODIUM CHLORIDE 0.9 % IV SOLN
Freq: Once | INTRAVENOUS | Status: AC
  Filled 2022-02-09: qty 250

## 2022-02-09 MED ORDER — BENZONATATE 100 MG PO CAPS: 100.0000 mg | ORAL_CAPSULE | Freq: Three times a day (TID) | ORAL | 1 refills | Status: DC | PRN

## 2022-02-09 MED ORDER — DIPHENHYDRAMINE HCL 25 MG PO CAPS
50.0000 mg | ORAL_CAPSULE | Freq: Once | ORAL | Status: AC
  Administered 2022-02-09: 50 mg via ORAL
  Filled 2022-02-09: qty 2

## 2022-02-09 MED ORDER — SODIUM CHLORIDE 0.9 % IV SOLN
576.0000 mg | Freq: Once | INTRAVENOUS | Status: AC
  Administered 2022-02-09: 580 mg via INTRAVENOUS
  Filled 2022-02-09: qty 58

## 2022-02-09 MED FILL — Dexamethasone Sodium Phosphate Inj 100 MG/10ML: INTRAMUSCULAR | Qty: 1 | Status: AC

## 2022-02-09 NOTE — Progress Notes (Signed)
Patient here today for follow up and treatment consideration regarding lung cancer. Patient reports new pain to left side, was cleaning the counters in her kitchen and felt a pop along her side and under arm. Pain has continued in that area, patient reports old rib fracture to same area as pain. Patient reports minimal shortness of breath. Patient reports she did take 2- 5mg  oxycodone tablets yesterday to help with worsening pain. Patient would also like to discuss with Dr. B plans to travel next week for her mothers funeral. 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

## 2022-02-09 NOTE — Patient Instructions (Signed)
[facility] AT [place]  Discharge Instructions: Thank you for choosing [facility] to provide your oncology and hematology care.  If you have a lab appointment with the [facility], please go directly to the [facility] and check in at the registration area.  Wear comfortable clothing and clothing appropriate for easy access to any Portacath or PICC line.   We strive to give you quality time with your provider. You may need to reschedule your appointment if you arrive late (15 or more minutes).  Arriving late affects you and other patients whose appointments are after yours.  Also, if you miss three or more appointments without notifying the office, you may be dismissed from the clinic at the provider's discretion.      For prescription refill requests, have your pharmacy contact our office and allow 72 hours for refills to be completed.    Today you received the following chemotherapy and/or immunotherapy agents- tecentriq, carboplatin, etoposide      To help prevent nausea and vomiting after your treatment, we encourage you to take your nausea medication as directed.  BELOW ARE SYMPTOMS THAT SHOULD BE REPORTED IMMEDIATELY: *FEVER GREATER THAN 100.4 F (38 C) OR HIGHER *CHILLS OR SWEATING *NAUSEA AND VOMITING THAT IS NOT CONTROLLED WITH YOUR NAUSEA MEDICATION *UNUSUAL SHORTNESS OF BREATH *UNUSUAL BRUISING OR BLEEDING *URINARY PROBLEMS (pain or burning when urinating, or frequent urination) *BOWEL PROBLEMS (unusual diarrhea, constipation, pain near the anus) TENDERNESS IN MOUTH AND THROAT WITH OR WITHOUT PRESENCE OF ULCERS (sore throat, sores in mouth, or a toothache) UNUSUAL RASH, SWELLING OR PAIN  UNUSUAL VAGINAL DISCHARGE OR ITCHING   Items with * indicate a potential emergency and should be followed up as soon as possible or go to the Emergency Department if any problems should occur.  Please show the CHEMOTHERAPY ALERT CARD or IMMUNOTHERAPY  ALERT CARD at check-in to the Emergency Department and triage nurse.  Should you have questions after your visit or need to cancel or reschedule your appointment, please contact [facility] CANCER [place] AT [place]  [phone] and follow the prompts.  Office hours are 8:00 a.m. to 4:30 p.m. Monday - Friday. Please note that voicemails left after 4:00 p.m. may not be returned until the following business day.  We are closed weekends and major holidays. You have access to a nurse at all times for urgent questions. Please call the main number to the clinic [phone] and follow the prompts.  For any non-urgent questions, you may also contact your provider using MyChart. We now offer e-Visits for anyone 78 and older to request care online for non-urgent symptoms. For details visit [email or web address].   Also download the MyChart app! Go to the app store, search "MyChart", open the app, select [facility], and log in with your MyChart username and password.  Masks are optional in the cancer centers. If you would like for your care team to wear a mask while they are taking care of you, please let them know. For doctor visits, patients may have with them one support person who is at least 64 years old. At this time, visitors are not allowed in the infusion area.

## 2022-02-09 NOTE — Assessment & Plan Note (Addendum)
#  Stage IV -SMALL CELL lung cancer [s/p liver Bx-; Previously on-small cell favor adenocarcinoma]-currently on carboplatin etoposide plus Tecentriq every 3 weeks.   # Proceed with cycle #3 carbo etoposide plus Tecentriq chemotherapy. [Dose reduction- by 20%-recent admission to hospital]. Labs today reviewed;  acceptable for treatment today.  Will plan imaging after 3 cycles.  # Bilateral rib pain- bone scan- OCT 2023- No scintigraphic evidence of bone metastasis.  ? Sec to liver disease;  continue oxycodone; Right sciatica/ radiates down the leg, but given worsening left rib pain- increase the neurontin 200 mg  BID.    # COPD [Dr.A]/allergies likely secondary to poorly controlled COPD. Continue using albuterol 3-4 times a day and also compliance with Advair.  continue Singulair.  STABLE.  # Right eye vision changes-3.6 mm lesion noted in the right eye;s/p ophthalmology evaluation at [facility]. 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 JUlY 2023- s/p revaluation with [facility] opth end of month- STABLE.   # Diabetes-A1c 6.9 [AUG 2022]-PBF- 164- Continue glipizide 5 mg BID STABLE;    # Cough secondary to radiation-currently on Tessalon Perles.STABLE; refilled.   # Hypocalcemia: on Vit D BID [8.6; ca-kidney stones].  JUNE  VitD levels-48.  STABLE;    # Mediport placement-s/p dye study c tPA-functioning. STABLE.  # OSTEOPOROSIS [Dr.O'conell]- reclast- plan once chemo is done.   #DISPOSITION: # chemo today- this week. # follow up in 3 weeks- /port-labs- cbc/cmp; carbo plus etoposide; etop- days 2 &3; D-4 udenyca; CT CAP prior-. Dr.B

## 2022-02-09 NOTE — Progress Notes (Signed)
[name] CONSULT NOTE  Patient Care Team: Dion Body, MD as PCP - General (Family Medicine) Leata Mouse, PA-C Byrnett, Forest Gleason, MD (General Surgery) Telford Nab, RN as Oncology Nurse Navigator Cammie Sickle, MD as Consulting Physician (Oncology)  CHIEF COMPLAINTS/PURPOSE OF CONSULTATION: lung cancer  Oncology History Overview Note  IMPRESSION: Hypermetabolic solid pulmonary nodule of the left lower lobe, favor primary lung malignancy.   Numerous hypermetabolic nodules of the lower left pleura, compatible with pleural metastatic disease.   Hypermetabolic subcarinal and left hilar lymph nodes, compatible with metastatic disease.   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 Recommend attention on follow-up.   Mild asymmetric FDG uptake below mediastinal blood pool within a small nodule of the left parotid gland, favored to be benign. Recommend attention on follow-up.   No evidence of metastatic disease in the abdomen or pelvis.   No evidence of osseous metastatic disease.   ---------------------  # MARCH 2022- incidental LLL [ 0.9 cm] s/p COVID vaccine; [Dr.Aleskerov]; July 2022- left pleural pain-   #  #October 2022 MRI brain-punctate lesion-asymptomatic [D/w- Dr.Vaslow]; December 2022 MRI negative for any acute process; chronic sclerosis of hippocampus-clinically insignificant.  COPD-non-complaint    ------------------   DIAGNOSIS:  A. PLEURAL MASS, LEFT CHEST; BIOPSY:  - DIAGNOSTIC OF MALIGNANCY.  - NON-SMALL CELL CARCINOMA, FAVOR ADENOCARCINOMA.   Comment:  The tumor cells are positive for CK7 and TTF1 (patchy).  They are  negative for p40.   # 12/10/2020-carbo Alimta; cycle #1.    # JUNE 8th, 2023-  Enlarging left lower  lobe pulmonary nodule seen on recent CT scan is markedly hypermetabolic, consistent with neoplasm; Hypermetabolic nodal metastatic disease in the left hilum and central mediastinum. JUNE- 2023- RT 20 Fractions.  # NGS PD-L1 TPS 1% ------------------------ OCT 2023- liver Biopsy-positive for "small cell"/high-grade neuroendocrine-   # ? NOV 14th, 2023-  carbo etoposide-Tecentriq every 3 weeks x 4 cycles.     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HCC) (Resolved)  05/02/2015 Initial Diagnosis   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[place])   Cancer of lower lobe of left lung ([facility])  12/02/2020 Initial Diagnosis   Cancer of lower lobe of left lung ([facility])   12/10/2020 Cancer Staging   Staging form: Lung, AJCC 8th Edition - Clinical: Stage IVA (cT4, cN3, cM1a) - Signed by Cammie Sickle, MD on 12/10/2020   12/10/2020 - 09/16/2021 Chemotherapy   Patient is on Treatment Plan : LUNG CARBOplatin / Pemetrexed / Pembrolizumab q21d Induction x 4 cycles / Maintenance Pemetrexed + Pembrolizumab     12/10/2020 - 11/18/2021 Chemotherapy   Patient is on Treatment Plan : LUNG Carboplatin (5) + Pemetrexed (500) + Pembrolizumab (200) D1 q21d Induction x 4 cycles / Maintenance Pemetrexed (500) + Pembrolizumab (200) D1 q21d     12/22/2021 -  Chemotherapy   Patient is on Treatment Plan : LUNG SCLC Carboplatin + Etoposide + Atezolizumab Induction q21d x 4 cycles / Atezolizumab Maintenance q21d        HISTORY OF PRESENTING ILLNESS: Ambulating independently.  Accompanied by her sister.   Felicia Manning 66 y.o.  female history of smoking-metastatic adeno ca lung cancer currently transformed/SMALL CELL LUNG CA [s/p liver biopsy] chemoimmunotherapy is here for follow-up.  Patient currently status post cycle #2 of carboplatin-etoposide-Tecentriq approximately 3 weeks ago.  Patient  reports new pain to left side, was cleaning the counters in her kitchen and felt a pop along her side and under arm. Pain has continued  in that area, patient reports old rib fracture to same area as pain.  Patient reports minimal shortness of breath. Patient currently on  44m oxycodone tablets yesterday to help with worsening pain up to 5 a day.   Patient's right hip pain stable improved with gabapentin. Mild diarrhea- mild nausea currently improved with antiemetics.  No weight loss. 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   Review of Systems  Constitutional:  Positive for malaise/fatigue. Negative for chills, diaphoresis, fever and weight loss.  HENT:  Negative for nosebleeds and sore throat.   Eyes:  Negative for double vision.  Respiratory:  Positive for cough and sputum production. Negative for hemoptysis and shortness of breath.   Cardiovascular:  Negative for palpitations, orthopnea and leg swelling.  Gastrointestinal:  Negative for abdominal pain, blood in stool, constipation, diarrhea, heartburn, melena, nausea and vomiting.  Genitourinary:  Negative for dysuria, frequency and urgency.  Musculoskeletal:  Negative for back pain and joint pain.  Skin: Negative.  Negative for itching and rash.  Neurological:  Negative for dizziness, tingling, focal weakness and weakness.  Endo/Heme/Allergies:  Does not bruise/bleed easily.  Psychiatric/Behavioral:  Negative for depression. The patient is not nervous/anxious and does not have insomnia.      MEDICAL HISTORY:  Past Medical History:  Diagnosis Date   Barrett's esophagus    Cancer associated pain    COPD (chronic obstructive pulmonary disease) ([facility]    Dysrhythmia    stress related at work.   GERD (gastroesophageal reflux disease)    H/O malignant neoplasm of thyroid 01/29/2014   Hepatitis C 2008   [place] 2017   History of kidney stones    Hyperlipidemia    Hypertension    Hypothyroidism    Liver cancer ([place]    metastasized from Lung   Nephrolithiasis    Non-small cell carcinoma of left lung ([facility]    Osteoporosis    Pulmonary mass    Thyroid cancer ([facility] 2012    Thyroid cancer ([facility] [place]    Tumor    tumor per pt back of rt eye   Type 2 diabetes mellitus ([facility]    Wears dentures    Full upper and lower.  Usually only wears upper    SURGICAL HISTORY: Past Surgical History:  Procedure Laterality Date   ABDOMINAL HYSTERECTOMY  2006   CATARACT EXTRACTION W/PHACO Right 12/08/2021   Procedure: CATARACT EXTRACTION PHACO AND INTRAOCULAR LENS PLACEMENT (IOC) RIGHT DIABETIC 6.62 00:35.1;  Surgeon: PBirder Robson MD;  [facility]  Service: Ophthalmology;  Laterality: Right;  Diabetic   CATARACT EXTRACTION W/PHACO Left 02/02/2022   Procedure: CATARACT EXTRACTION PHACO AND INTRAOCULAR LENS PLACEMENT (IOC) LEFT DIABETIC  5.33  00:30.3;  Surgeon: PBirder Robson MD;  [facility]  Service: Ophthalmology;  Laterality: Left;  Diabetic   COLONOSCOPY WITH PROPOFOL N/A 04/25/2015   Procedure: COLONOSCOPY WITH PROPOFOL;  Surgeon: MJosefine Class MD;  [facility]  Service: Endoscopy;  Laterality: N/A;   CYSTOSCOPY W/ RETROGRADES Bilateral 05/19/2015   Procedure: CYSTOSCOPY WITH RETROGRADE PYELOGRAM;  Surgeon: AHollice Espy MD;  Location: [facility];  Service: Urology;  Laterality: Bilateral;   CYSTOSCOPY W/ URETERAL STENT PLACEMENT Right 05/26/2015   Procedure: CYSTOSCOPY WITH STENT REPLACEMENT;  Surgeon: AHollice Espy MD;  Location: [facility];  Service: Urology;  Laterality: Right;   [place]  Right 05/19/2015   Procedure: CYSTOSCOPY WITH STENT PLACEMENT;  Surgeon: Hollice Espy, MD;  Location: [facility];  Service: Urology;  Laterality: Right;   CYSTOSCOPY WITH STENT PLACEMENT Right 05/26/2015   Procedure: CYSTOSCOPY WITH STENT PLACEMENT;  Surgeon: Hollice Espy, MD;  Location: [facility];  Service: Urology;  Laterality: Right;   CYSTOSCOPY WITH STENT PLACEMENT Right 06/25/2016   Procedure: CYSTOSCOPY WITH STENT PLACEMENT;  Surgeon: Nickie Retort, MD;  Location: [facility];  Service: Urology;   Laterality: Right;   CYSTOSCOPY WITH STENT PLACEMENT Left 10/31/2017   Procedure: CYSTOSCOPY WITH STENT PLACEMENT;  Surgeon: Hollice Espy, MD;  Location: [facility];  Service: Urology;  Laterality: Left;   CYSTOSCOPY/URETEROSCOPY/HOLMIUM LASER/STENT PLACEMENT Left 07/23/2015   Procedure: CYSTOSCOPY/URETEROSCOPY/HOLMIUM LASER/STENT PLACEMENT/RETROGRADE PYELOGRAM;  Surgeon: Hollice Espy, MD;  Location: [facility];  Service: Urology;  Laterality: Left;   CYSTOSCOPY/URETEROSCOPY/HOLMIUM LASER/STENT PLACEMENT Left 11/16/2017   Procedure: CYSTOSCOPY/URETEROSCOPY/HOLMIUM LASER/STENT Exchange;  Surgeon: Hollice Espy, MD;  Location: [facility];  Service: Urology;  Laterality: Left;   ESOPHAGOGASTRODUODENOSCOPY (EGD) WITH PROPOFOL N/A 04/25/2015   Procedure: ESOPHAGOGASTRODUODENOSCOPY (EGD) WITH PROPOFOL;  Surgeon: Josefine Class, MD;  Location: [facility];  Service: Endoscopy;  Laterality: N/A;   EYE SURGERY Bilateral 1964   lazy eye repair   EYE SURGERY Bilateral 2001   lazy eye repair   IR IMAGING GUIDED PORT INSERTION  12/08/2020   LITHOTRIPSY  2009   THYROIDECTOMY  2010   URETEROSCOPY WITH HOLMIUM LASER LITHOTRIPSY Right 05/19/2015   Procedure: URETEROSCOPY WITH HOLMIUM LASER LITHOTRIPSY;  Surgeon: Hollice Espy, MD;  Location: [facility];  Service: Urology;  Laterality: Right;   URETEROSCOPY WITH HOLMIUM LASER LITHOTRIPSY Right 05/26/2015   Procedure: URETEROSCOPY WITH HOLMIUM LASER LITHOTRIPSY;  Surgeon: Hollice Espy, MD;  Location: [facility];  Service: Urology;  Laterality: Right;   URETEROSCOPY WITH HOLMIUM LASER LITHOTRIPSY Right 06/25/2016   Procedure: URETEROSCOPY WITH HOLMIUM LASER LITHOTRIPSY;  Surgeon: Nickie Retort, MD;  Location: [facility];  Service: Urology;  Laterality: Right;    SOCIAL HISTORY: Social History   Socioeconomic History   Marital status: Divorced    Spouse name: Not on file   Number of children: Not on file   Years of education: Not on file   Highest  education level: Not on file  Occupational History   Not on file  Tobacco Use   Smoking status: Former    Packs/day: 1.00    Years: 50.00    Total pack years: 50.00    Types: Cigarettes    Quit date: 08/15/2021    Years since quitting: 0.4   Smokeless tobacco: Never   Tobacco comments:    Started smoking age 74  Vaping Use   Vaping Use: Former  Substance and Sexual Activity   Alcohol use: Not Currently    Comment: socially   Drug use: No   Sexual activity: Not Currently    Birth control/protection: None  Other Topics Concern   Not on file  Social History Narrative   Smoking: 1p 1-2 days since 17 years; alcohol: rare; 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     Social Determinants of Health   Financial Resource Strain: Not on file  Food Insecurity: No Food Insecurity (12/27/2021)   Hunger Vital Sign    Worried About Running Out of Food in the Last Year: Never true    Ran Out of Food in the Last Year: Never true  Transportation Needs: Unmet Transportation Needs (02/09/2022)  PRAPARE - Hydrologist (Medical): Yes    Lack of Transportation (Non-Medical): Yes  Physical Activity: Not on file  Stress: Not on file  Social Connections: Not on file  Intimate Partner Violence: Not At Risk (12/27/2021)   Humiliation, Afraid, Rape, and Kick questionnaire    Fear of Current or Ex-Partner: No    Emotionally Abused: No    Physically Abused: No    Sexually Abused: No    FAMILY HISTORY: Family History  Problem Relation Age of Onset   CAD Mother    Heart disease Mother    Dementia Father    Bladder Cancer Neg Hx    Prostate cancer Neg Hx    Kidney cancer Neg Hx    Breast cancer Neg Hx     ALLERGIES:  has No Known Allergies.  MEDICATIONS:  Current Outpatient Medications  Medication Sig Dispense Refill   Accu-Chek Softclix Lancets lancets Check glucose once a day 100 each  12   ARTIFICIAL TEAR OP Apply to eye.     blood glucose meter kit and supplies KIT Dispense based on patient and insurance preference. Use up to three times daily as directed. 1 each 0   buPROPion (WELLBUTRIN SR) 150 MG 12 hr tablet Take 150 mg by mouth daily.     cholecalciferol (VITAMIN D) 1000 units tablet Take 2,000 Units by mouth daily.     folic acid (FOLVITE) 1 MG tablet Take 1 tablet (1 mg total) by mouth daily. 90 tablet 1   glipiZIDE (GLUCOTROL) 5 MG tablet Take 1 tablet (5 mg total) by mouth 2 (two) times daily before a meal. 180 tablet 1   lidocaine (LIDODERM) 5 % Place 1 patch onto the skin daily. 30 patch 0   lidocaine-prilocaine (EMLA) cream Apply 1 application topically as needed. 30 g 0   metoprolol succinate (TOPROL-XL) 25 MG 24 hr tablet Take 25 mg by mouth every morning.   1   montelukast (SINGULAIR) 10 MG tablet TAKE 1 TABLET BY MOUTH AT BEDTIME 30 tablet 0   omeprazole (PRILOSEC) 20 MG capsule Take 20 mg by mouth daily before breakfast.      oxyCODONE (OXY IR/ROXICODONE) 5 MG immediate release tablet Take 1 tablet (5 mg total) by mouth every 4 (four) hours as needed for severe pain. 60 tablet 0   albuterol (VENTOLIN HFA) 108 (90 Base) MCG/ACT inhaler Inhale 2 puffs into the lungs every 6 (six) hours as needed for wheezing or shortness of breath. (Patient not taking: Reported on 01/26/2022) 8 g 2   benzonatate (TESSALON PERLES) 100 MG capsule Take 1 capsule (100 mg total) by mouth 3 (three) times daily as needed for cough. 90 capsule 1   Cranberry-Vitamin C-Probiotic (AZO CRANBERRY PO) Take 1 tablet by mouth daily. (Patient not taking: Reported on 02/09/2022)     fluticasone-salmeterol (ADVAIR HFA) 45-21 MCG/ACT inhaler Inhale 2 puffs into the lungs 2 (two) times daily. (Patient not taking: Reported on 01/26/2022) 1 each 12   gabapentin (NEURONTIN) 100 MG capsule Take 2 capsules (200 mg total) by mouth 2 (two) times daily. 30 capsule 2   levothyroxine (SYNTHROID) 175 MCG tablet  Take 1 tablet (175 mcg total) by mouth daily before breakfast. (Patient not taking: Reported on 02/09/2022) 90 tablet 0   loperamide (IMODIUM) 2 MG capsule Take by mouth as needed for diarrhea or loose stools. (Patient not taking: Reported on 01/26/2022)     ondansetron (ZOFRAN) 8 MG tablet Take 1 tablet (8  mg total) by mouth every 8 (eight) hours as needed for nausea or vomiting. Start on the third day after chemotherapy (Patient not taking: Reported on 01/26/2022) 30 tablet 2   prochlorperazine (COMPAZINE) 10 MG tablet Take 1 tablet (10 mg total) by mouth every 6 (six) hours as needed for nausea or vomiting. (Patient not taking: Reported on 01/26/2022) 30 tablet 2   No current facility-administered medications for this visit.   Facility-Administered Medications Ordered in Other Visits  Medication Dose Route Frequency Provider Last Rate Last Admin   atezolizumab (TECENTRIQ) 1,200 mg in sodium chloride 0.9 % 250 mL chemo infusion  1,200 mg Intravenous Once Cammie Sickle, MD       CARBOplatin (PARAPLATIN) 580 mg in sodium chloride 0.9 % 250 mL chemo infusion  580 mg Intravenous Once Cammie Sickle, MD       dexamethasone (DECADRON) 10 mg in sodium chloride 0.9 % 50 mL IVPB  10 mg Intravenous Once Cammie Sickle, MD       diphenhydrAMINE (BENADRYL) capsule 50 mg  50 mg Oral Once Cammie Sickle, MD       etoposide (VEPESID) 160 mg in sodium chloride 0.9 % 500 mL chemo infusion  80 mg/m2 (Treatment Plan Recorded) Intravenous Once Cammie Sickle, MD       famotidine (PEPCID) IVPB 20 mg premix  20 mg Intravenous Once Cammie Sickle, MD       fosaprepitant (EMEND) 150 mg in sodium chloride 0.9 % 145 mL IVPB  150 mg Intravenous Once Charlaine Dalton R, MD       heparin lock flush 100 UNIT/ML injection            heparin lock flush 100 unit/mL  500 Units Intracatheter Once PRN Cammie Sickle, MD       palonosetron (ALOXI) injection 0.25 mg  0.25 mg  Intravenous Once Charlaine Dalton R, MD          .  PHYSICAL EXAMINATION: ECOG PERFORMANCE STATUS: 1 - Symptomatic but completely ambulatory  Vitals:   02/09/22 1002  BP: (!) 130/90  Pulse: 96  Resp: 18  Temp: 98 F (36.7 C)  SpO2: 96%     Filed Weights   02/09/22 1002  Weight: 171 lb (77.6 kg)    Physical Exam Vitals and nursing note reviewed.  HENT:     Head: Normocephalic and atraumatic.     Mouth/Throat:     Pharynx: Oropharynx is clear.  Eyes:     Extraocular Movements: Extraocular movements intact.     Pupils: Pupils are equal, round, and reactive to light.  Cardiovascular:     Rate and Rhythm: Normal rate and regular rhythm.  Pulmonary:     Comments: Decreased breath sounds bilaterally.  Abdominal:     Palpations: Abdomen is soft.  Musculoskeletal:        General: Normal range of motion.     Cervical back: Normal range of motion.  Skin:    General: Skin is warm.  Neurological:     General: No focal deficit present.     Mental Status: She is alert and oriented to person, place, and time.  Psychiatric:        Behavior: Behavior normal.        Judgment: Judgment normal.      LABORATORY DATA:  I have reviewed the data as listed Lab Results  Component Value Date   WBC 12.3 (H) 02/09/2022   HGB 10.7 (L) 02/09/2022  HCT 34.4 (L) 02/09/2022   MCV 102.7 (H) 02/09/2022   PLT 213 02/09/2022   Recent Labs    01/19/22 0843 01/26/22 1421 02/09/22 0911  NA 141 140 140  K 3.5 3.4* 3.5  CL 103 102 107  CO2 _0 GLUCOSE 155* 114* 165*  BUN _1 CREATININE 0.78 0.59 0.72  CALCIUM 8.7* 8.7* 8.7*  GFRNONAA >60 >60 >60  PROT 6.8 6.7 6.6  ALBUMIN 3.5 3.5 3.4*  AST _2 ALT _3 ALKPHOS 88 118 108  BILITOT 0.7 0.6 0.3    RADIOGRAPHIC STUDIES: I have personally reviewed the radiological images as listed and agreed with the findings in the report. No results found.  ASSESSMENT & PLAN:   Cancer of lower lobe of left lung  ([place]) #Stage IV -SMALL CELL lung cancer [s/p liver Bx-; Previously on-small cell favor adenocarcinoma]-currently on carboplatin etoposide plus Tecentriq every 3 weeks.   # Proceed with cycle #3 carbo etoposide plus Tecentriq chemotherapy. [Dose reduction- by 20%-recent admission to hospital]. Labs today reviewed;  acceptable for treatment today.  Will plan imaging after 3 cycles.  # Bilateral rib pain- bone scan- OCT 2023- No scintigraphic evidence of bone metastasis.  ? Sec to liver disease;  continue oxycodone; Right sciatica/ radiates down the leg, but given worsening left rib pain- increase the neurontin 200 mg  BID.    # COPD [Dr.A]/allergies likely secondary to poorly controlled COPD. Continue using albuterol 3-4 times a day and also compliance with Advair.  continue Singulair.  STABLE.  # Right eye vision changes-3.6 mm lesion noted in the right eye;s/p ophthalmology evaluation at [facility]. 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 JUlY 2023- s/p revaluation with [facility] opth end of month- STABLE.   # Diabetes-A1c 6.9 [AUG 2022]-PBF- 164- Continue glipizide 5 mg BID STABLE;    # Cough secondary to radiation-currently on Tessalon Perles.STABLE; refilled.   # Hypocalcemia: on Vit D BID [8.6; ca-kidney stones].  JUNE  VitD levels-48.  STABLE;    # Mediport placement-s/p dye study c tPA-functioning. STABLE.  # OSTEOPOROSIS [Dr.O'conell]- reclast- plan once chemo is done.   #DISPOSITION: # chemo today- this week. # follow up in 3 weeks- /port-labs- cbc/cmp; carbo plus etoposide; etop- days 2 &3; D-4 udenyca; CT CAP prior-. Dr.B    All questions were answered. The patient knows to call the clinic with any problems, questions or concerns.       Cammie Sickle, MD 02/09/2022 11:13 AM

## 2022-02-10 ENCOUNTER — Inpatient Hospital Stay: Payer: 59

## 2022-02-10 VITALS — BP 118/64 | HR 94 | Temp 98.0°F | Resp 16

## 2022-02-10 DIAGNOSIS — C3432 Malignant neoplasm of lower lobe, left bronchus or lung: Secondary | ICD-10-CM

## 2022-02-10 DIAGNOSIS — Z5112 Encounter for antineoplastic immunotherapy: Secondary | ICD-10-CM | POA: Diagnosis not present

## 2022-02-10 LAB — T4: T4, Total: 10.2 ug/dL (ref 4.5–12.0)

## 2022-02-10 MED ORDER — SODIUM CHLORIDE 0.9 % IV SOLN
80.0000 mg/m2 | Freq: Once | INTRAVENOUS | Status: AC
  Administered 2022-02-10: 160 mg via INTRAVENOUS
  Filled 2022-02-10: qty 8

## 2022-02-10 MED ORDER — HEPARIN SOD (PORK) LOCK FLUSH 100 UNIT/ML IV SOLN
500.0000 [IU] | Freq: Once | INTRAVENOUS | Status: AC | PRN
  Administered 2022-02-10: 500 [IU]
  Filled 2022-02-10: qty 5

## 2022-02-10 MED ORDER — SODIUM CHLORIDE 0.9 % IV SOLN
Freq: Once | INTRAVENOUS | Status: AC
  Filled 2022-02-10: qty 250

## 2022-02-10 MED ORDER — SODIUM CHLORIDE 0.9% FLUSH
10.0000 mL | INTRAVENOUS | Status: DC | PRN
  Administered 2022-02-10: 10 mL
  Filled 2022-02-10: qty 10

## 2022-02-10 MED ORDER — SODIUM CHLORIDE 0.9 % IV SOLN
10.0000 mg | Freq: Once | INTRAVENOUS | Status: AC
  Administered 2022-02-10: 10 mg via INTRAVENOUS
  Filled 2022-02-10: qty 1

## 2022-02-10 MED FILL — Dexamethasone Sodium Phosphate Inj 100 MG/10ML: INTRAMUSCULAR | Qty: 1 | Status: AC

## 2022-02-10 NOTE — Patient Instructions (Signed)
[facility] CTR AT [place]  Discharge Instructions: Thank you for choosing [facility] to provide your oncology and hematology care.  If you have a lab appointment with the [facility], please go directly to the [facility] and check in at the registration area.  Wear comfortable clothing and clothing appropriate for easy access to any Portacath or PICC line.   We strive to give you quality time with your provider. You may need to reschedule your appointment if you arrive late (15 or more minutes).  Arriving late affects you and other patients whose appointments are after yours.  Also, if you miss three or more appointments without notifying the office, you may be dismissed from the clinic at the provider's discretion.      For prescription refill requests, have your pharmacy contact our office and allow 72 hours for refills to be completed.    Today you received the following chemotherapy and/or immunotherapy agents- etoposide      To help prevent nausea and vomiting after your treatment, we encourage you to take your nausea medication as directed.  BELOW ARE SYMPTOMS THAT SHOULD BE REPORTED IMMEDIATELY: *FEVER GREATER THAN 100.4 F (38 C) OR HIGHER *CHILLS OR SWEATING *NAUSEA AND VOMITING THAT IS NOT CONTROLLED WITH YOUR NAUSEA MEDICATION *UNUSUAL SHORTNESS OF BREATH *UNUSUAL BRUISING OR BLEEDING *URINARY PROBLEMS (pain or burning when urinating, or frequent urination) *BOWEL PROBLEMS (unusual diarrhea, constipation, pain near the anus) TENDERNESS IN MOUTH AND THROAT WITH OR WITHOUT PRESENCE OF ULCERS (sore throat, sores in mouth, or a toothache) UNUSUAL RASH, SWELLING OR PAIN  UNUSUAL VAGINAL DISCHARGE OR ITCHING   Items with * indicate a potential emergency and should be followed up as soon as possible or go to the Emergency Department if any problems should occur.  Please show the CHEMOTHERAPY ALERT CARD or IMMUNOTHERAPY ALERT CARD at check-in to  the Emergency Department and triage nurse.  Should you have questions after your visit or need to cancel or reschedule your appointment, please contact [facility] CANCER [place] AT [place]  [phone] and follow the prompts.  Office hours are 8:00 a.m. to 4:30 p.m. Monday - Friday. Please note that voicemails left after 4:00 p.m. may not be returned until the following business day.  We are closed weekends and major holidays. You have access to a nurse at all times for urgent questions. Please call the main number to the clinic [phone] and follow the prompts.  For any non-urgent questions, you may also contact your provider using MyChart. We now offer e-Visits for anyone 41 and older to request care online for non-urgent symptoms. For details visit [email or web address].   Also download the MyChart app! Go to the app store, search "MyChart", open the app, select [facility], and log in with your MyChart username and password.  Masks are optional in the cancer centers. If you would like for your care team to wear a mask while they are taking care of you, please let them know. For doctor visits, patients may have with them one support person who is at least 65 years old. At this time, visitors are not allowed in the infusion area.

## 2022-02-11 ENCOUNTER — Inpatient Hospital Stay: Payer: 59

## 2022-02-11 VITALS — BP 120/62 | HR 90 | Temp 97.7°F

## 2022-02-11 DIAGNOSIS — Z5112 Encounter for antineoplastic immunotherapy: Secondary | ICD-10-CM | POA: Diagnosis not present

## 2022-02-11 DIAGNOSIS — C3432 Malignant neoplasm of lower lobe, left bronchus or lung: Secondary | ICD-10-CM

## 2022-02-11 MED ORDER — SODIUM CHLORIDE 0.9 % IV SOLN
Freq: Once | INTRAVENOUS | Status: AC
  Filled 2022-02-11: qty 250

## 2022-02-11 MED ORDER — SODIUM CHLORIDE 0.9 % IV SOLN
10.0000 mg | Freq: Once | INTRAVENOUS | Status: AC
  Administered 2022-02-11: 10 mg via INTRAVENOUS
  Filled 2022-02-11: qty 10

## 2022-02-11 MED ORDER — HEPARIN SOD (PORK) LOCK FLUSH 100 UNIT/ML IV SOLN
500.0000 [IU] | Freq: Once | INTRAVENOUS | Status: AC | PRN
  Administered 2022-02-11: 500 [IU]
  Filled 2022-02-11: qty 5

## 2022-02-11 MED ORDER — SODIUM CHLORIDE 0.9 % IV SOLN
80.0000 mg/m2 | Freq: Once | INTRAVENOUS | Status: AC
  Administered 2022-02-11: 160 mg via INTRAVENOUS
  Filled 2022-02-11: qty 8

## 2022-02-12 ENCOUNTER — Inpatient Hospital Stay: Payer: 59

## 2022-02-12 ENCOUNTER — Other Ambulatory Visit: Payer: Self-pay | Admitting: Internal Medicine

## 2022-02-12 DIAGNOSIS — C3432 Malignant neoplasm of lower lobe, left bronchus or lung: Secondary | ICD-10-CM

## 2022-02-12 DIAGNOSIS — Z5112 Encounter for antineoplastic immunotherapy: Secondary | ICD-10-CM | POA: Diagnosis not present

## 2022-02-12 MED ORDER — OXYCODONE HCL 5 MG PO TABS: 5.0000 mg | ORAL_TABLET | ORAL | 0 refills | Status: DC | PRN

## 2022-02-12 MED ORDER — PEGFILGRASTIM INJECTION 6 MG/0.6ML ~~LOC~~
6.0000 mg | PREFILLED_SYRINGE | Freq: Once | SUBCUTANEOUS | Status: AC
  Administered 2022-02-12: 6 mg via SUBCUTANEOUS
  Filled 2022-02-12: qty 0.6

## 2022-02-16 ENCOUNTER — Inpatient Hospital Stay: Payer: 59 | Attending: Internal Medicine

## 2022-02-16 ENCOUNTER — Telehealth: Payer: Self-pay | Admitting: Internal Medicine

## 2022-02-16 ENCOUNTER — Inpatient Hospital Stay (HOSPITAL_BASED_OUTPATIENT_CLINIC_OR_DEPARTMENT_OTHER): Payer: 59 | Admitting: Nurse Practitioner

## 2022-02-16 ENCOUNTER — Encounter: Payer: Self-pay | Admitting: Nurse Practitioner

## 2022-02-16 ENCOUNTER — Inpatient Hospital Stay: Payer: 59

## 2022-02-16 ENCOUNTER — Telehealth: Payer: Self-pay | Admitting: *Deleted

## 2022-02-16 ENCOUNTER — Other Ambulatory Visit: Payer: Self-pay

## 2022-02-16 ENCOUNTER — Other Ambulatory Visit: Payer: Self-pay | Admitting: *Deleted

## 2022-02-16 VITALS — Wt 166.0 lb

## 2022-02-16 VITALS — BP 110/72 | HR 76 | Temp 97.9°F | Resp 22

## 2022-02-16 DIAGNOSIS — Z5189 Encounter for other specified aftercare: Secondary | ICD-10-CM | POA: Diagnosis not present

## 2022-02-16 DIAGNOSIS — Z79899 Other long term (current) drug therapy: Secondary | ICD-10-CM | POA: Insufficient documentation

## 2022-02-16 DIAGNOSIS — E1140 Type 2 diabetes mellitus with diabetic neuropathy, unspecified: Secondary | ICD-10-CM | POA: Insufficient documentation

## 2022-02-16 DIAGNOSIS — C3432 Malignant neoplasm of lower lobe, left bronchus or lung: Secondary | ICD-10-CM

## 2022-02-16 DIAGNOSIS — J449 Chronic obstructive pulmonary disease, unspecified: Secondary | ICD-10-CM | POA: Diagnosis not present

## 2022-02-16 DIAGNOSIS — Z7984 Long term (current) use of oral hypoglycemic drugs: Secondary | ICD-10-CM | POA: Diagnosis not present

## 2022-02-16 DIAGNOSIS — F4321 Adjustment disorder with depressed mood: Secondary | ICD-10-CM

## 2022-02-16 DIAGNOSIS — C782 Secondary malignant neoplasm of pleura: Secondary | ICD-10-CM | POA: Diagnosis not present

## 2022-02-16 DIAGNOSIS — Z85850 Personal history of malignant neoplasm of thyroid: Secondary | ICD-10-CM | POA: Diagnosis not present

## 2022-02-16 LAB — CBC WITH DIFFERENTIAL/PLATELET
Abs Immature Granulocytes: 0.81 10*3/uL — ABNORMAL HIGH (ref 0.00–0.07)
Basophils Absolute: 0 10*3/uL (ref 0.0–0.1)
Basophils Relative: 0 %
Eosinophils Absolute: 0.1 10*3/uL (ref 0.0–0.5)
Eosinophils Relative: 0 %
HCT: 33.1 % — ABNORMAL LOW (ref 36.0–46.0)
Hemoglobin: 10.5 g/dL — ABNORMAL LOW (ref 12.0–15.0)
Immature Granulocytes: 3 %
Lymphocytes Relative: 4 %
Lymphs Abs: 1 10*3/uL (ref 0.7–4.0)
MCH: 31.5 pg (ref 26.0–34.0)
MCHC: 31.7 g/dL (ref 30.0–36.0)
MCV: 99.4 fL (ref 80.0–100.0)
Monocytes Absolute: 0.3 10*3/uL (ref 0.1–1.0)
Monocytes Relative: 1 %
Neutro Abs: 22.6 10*3/uL — ABNORMAL HIGH (ref 1.7–7.7)
Neutrophils Relative %: 92 %
Platelets: 189 10*3/uL (ref 150–400)
RBC: 3.33 MIL/uL — ABNORMAL LOW (ref 3.87–5.11)
RDW: 17.6 % — ABNORMAL HIGH (ref 11.5–15.5)
Smear Review: NORMAL
WBC: 24.8 10*3/uL — ABNORMAL HIGH (ref 4.0–10.5)
nRBC: 0 % (ref 0.0–0.2)

## 2022-02-16 LAB — COMPREHENSIVE METABOLIC PANEL
ALT: 21 U/L (ref 0–44)
AST: 23 U/L (ref 15–41)
Albumin: 3.7 g/dL (ref 3.5–5.0)
Alkaline Phosphatase: 122 U/L (ref 38–126)
Anion gap: 10 (ref 5–15)
BUN: 14 mg/dL (ref 8–23)
CO2: 26 mmol/L (ref 22–32)
Calcium: 8.4 mg/dL — ABNORMAL LOW (ref 8.9–10.3)
Chloride: 101 mmol/L (ref 98–111)
Creatinine, Ser: 0.64 mg/dL (ref 0.44–1.00)
GFR, Estimated: >60 mL/min (ref >60)
Glucose, Bld: 130 mg/dL — ABNORMAL HIGH (ref 70–99)
Potassium: 3.5 mmol/L (ref 3.5–5.1)
Sodium: 137 mmol/L (ref 135–145)
Total Bilirubin: 0.6 mg/dL (ref 0.3–1.2)
Total Protein: 6.8 g/dL (ref 6.5–8.1)

## 2022-02-16 LAB — MAGNESIUM: Magnesium: 1.8 mg/dL (ref 1.7–2.4)

## 2022-02-16 MED ORDER — HEPARIN SOD (PORK) LOCK FLUSH 100 UNIT/ML IV SOLN
500.0000 [IU] | Freq: Once | INTRAVENOUS | Status: AC
  Administered 2022-02-16: 500 [IU] via INTRAVENOUS
  Filled 2022-02-16: qty 5

## 2022-02-16 MED ORDER — SODIUM CHLORIDE 0.9% FLUSH
10.0000 mL | Freq: Once | INTRAVENOUS | Status: AC
  Administered 2022-02-16: 10 mL via INTRAVENOUS
  Filled 2022-02-16: qty 10

## 2022-02-16 MED ORDER — SODIUM CHLORIDE 0.9 % IV SOLN
Freq: Once | INTRAVENOUS | Status: AC
  Filled 2022-02-16: qty 250

## 2022-02-16 NOTE — Telephone Encounter (Signed)
Pt currently on her way to cancer center. She has someone that can bring her to the apt. Lucianne Lei transportation not needed today per patient.

## 2022-02-16 NOTE — Progress Notes (Signed)
[facility] [place] [facility] [place]. [facility] [address], [place],  [address] [phone] (phone) [phone] (fax)  Patient Care Team: Dion Body, MD as PCP - General (Family Medicine) Leata Mouse, PA-C Byrnett, Forest Gleason, MD (General Surgery) Telford Nab, RN as Oncology Nurse Navigator Cammie Sickle, MD as Consulting Physician (Oncology)   Name of the patient: Felicia Manning  [id number]  08-04-1955   Date of visit: 02/16/22  Diagnosis- Lung Cancer  Chief complaint/ Reason for visit- dizzy weak malaise  Heme/Onc history:  Oncology History Overview Note  IMPRESSION: Hypermetabolic solid pulmonary nodule of the left lower lobe, favor primary lung malignancy.   Numerous hypermetabolic nodules of the lower left pleura, compatible with pleural metastatic disease.   Hypermetabolic subcarinal and left hilar lymph nodes, compatible with metastatic disease.   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 Recommend attention on follow-up.   Mild asymmetric FDG uptake below mediastinal blood pool within a small nodule of the left parotid gland, favored to be benign. Recommend attention on follow-up.   No evidence of metastatic disease in the abdomen or pelvis.   No evidence of osseous metastatic disease.   ---------------------  # MARCH 2022- incidental LLL [ 0.9 cm] s/p COVID vaccine; [Dr.Aleskerov]; July 2022- left pleural pain-   #  #October 2022 MRI brain-punctate lesion-asymptomatic [D/w- Dr.Vaslow]; December 2022 MRI negative for any acute process; chronic sclerosis of hippocampus-clinically insignificant.  COPD-non-complaint     ------------------   DIAGNOSIS:  A. PLEURAL MASS, LEFT CHEST; BIOPSY:  - DIAGNOSTIC OF MALIGNANCY.  - NON-SMALL CELL CARCINOMA, FAVOR ADENOCARCINOMA.   Comment:  The tumor cells are positive for CK7 and TTF1 (patchy).  They are  negative for p40.   # 12/10/2020-carbo Alimta; cycle #1.    # JUNE 8th, 2023-  Enlarging left lower lobe pulmonary nodule seen on recent CT scan is markedly hypermetabolic, consistent with neoplasm; Hypermetabolic nodal metastatic disease in the left hilum and central mediastinum. JUNE- 2023- RT 20 Fractions.  # NGS PD-L1 TPS 1% ------------------------ OCT 2023- liver Biopsy-positive for "small cell"/high-grade neuroendocrine-   # ? NOV 14th, 2023-  carbo etoposide-Tecentriq every 3 weeks x 4 cycles.     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[place])   Cancer of lower lobe of left lung ([place])  12/02/2020 Initial Diagnosis   Cancer of lower lobe of left lung ([place])   12/10/2020 Cancer Staging   Staging form: Lung, AJCC 8th Edition - Clinical: Stage IVA (cT4, cN3, cM1a) - Signed by Cammie Sickle, MD on 12/10/2020   12/10/2020 - 09/16/2021 Chemotherapy   Patient is on Treatment Plan : LUNG CARBOplatin / Pemetrexed / Pembrolizumab q21d Induction x 4 cycles / Maintenance Pemetrexed + Pembrolizumab     12/10/2020 - 11/18/2021 Chemotherapy   Patient is on Treatment Plan : LUNG Carboplatin (5) + Pemetrexed (500) + Pembrolizumab (200) D1 q21d Induction x 4 cycles / Maintenance Pemetrexed (500) + Pembrolizumab (200) D1 q21d     12/22/2021 -  Chemotherapy   Patient is on Treatment Plan : LUNG SCLC Carboplatin + Etoposide + Atezolizumab Induction q21d x 4 cycles / Atezolizumab Maintenance q21d  Interval history- Patient is 67 year old female currently receiving chemotherapy for lung cancer who presents to [facility] for weakness and dizziness. She has to  travel to [facility] tomorrow for her mothers funeral and is worried about feeling poorly for trip. She has suffered the loss of multiple family members very recently and continues to grieve. She believes she would benefit from IV fluids as this has helped her symptoms previously. No nausea or vomiting. Peripheral neuropathy is stable and she requests refill of gabapentin. No constipation or diarrhea. No chest pain or shortness of breath.   ECOG FS:2 - Symptomatic, <50% confined to bed  Review of systems- Review of Systems  Constitutional:  Positive for malaise/fatigue. Negative for chills, fever and weight loss.  HENT:  Negative for hearing loss, nosebleeds, sore throat and tinnitus.   Eyes:  Negative for blurred vision and double vision.  Respiratory:  Negative for cough, hemoptysis, shortness of breath and wheezing.   Cardiovascular:  Negative for chest pain, palpitations and leg swelling.  Gastrointestinal:  Negative for abdominal pain, blood in stool, constipation, diarrhea, melena, nausea and vomiting.  Genitourinary:  Negative for dysuria and urgency.  Musculoskeletal:  Negative for back pain, falls, joint pain and myalgias.  Skin:  Negative for itching and rash.  Neurological:  Positive for dizziness and weakness. Negative for tingling, sensory change, loss of consciousness and headaches.  Endo/Heme/Allergies:  Negative for environmental allergies. Does not bruise/bleed easily.  Psychiatric/Behavioral:  Negative for depression. The patient is not nervous/anxious and does not have insomnia.      Current treatment- carbo-etoposide-atezolizumab  No Known Allergies  Past Medical History:  Diagnosis Date   Barrett's esophagus    Cancer associated pain    COPD (chronic obstructive pulmonary disease) (HCC)    Dysrhythmia    stress related at work.   GERD (gastroesophageal reflux disease)    H/O malignant neoplasm of thyroid 01/29/2014   Hepatitis C 2008   Harvoni 2017   History of  kidney stones    Hyperlipidemia    Hypertension    Hypothyroidism    Liver cancer ([place])    metastasized from Lung   Nephrolithiasis    Non-small cell carcinoma of left lung ([place])    Osteoporosis    Pulmonary mass    Thyroid cancer ([place]) 2012   Thyroid cancer ([place])    Tumor    tumor per pt back of rt eye   Type 2 diabetes mellitus ([place])    Wears dentures    Full upper and lower.  Usually only wears upper    Past Surgical History:  Procedure Laterality Date   ABDOMINAL HYSTERECTOMY  2006   CATARACT EXTRACTION W/PHACO Right 12/08/2021   Procedure: CATARACT EXTRACTION PHACO AND INTRAOCULAR LENS PLACEMENT (IOC) RIGHT DIABETIC 6.62 00:35.1;  Surgeon: Birder Robson, MD;  Location: [place];  Service: Ophthalmology;  Laterality: Right;  Diabetic   CATARACT EXTRACTION W/PHACO Left 02/02/2022   Procedure: CATARACT EXTRACTION PHACO AND INTRAOCULAR LENS PLACEMENT (IOC) LEFT DIABETIC  5.33  00:30.3;  Surgeon: Birder Robson, MD;  Location: [place];  Service: Ophthalmology;  Laterality: Left;  Diabetic   COLONOSCOPY WITH PROPOFOL N/A 04/25/2015   Procedure: COLONOSCOPY WITH PROPOFOL;  Surgeon: Josefine Class, MD;  Location[facility]COPY;  Service: Endoscopy;  Laterality: N/A;   CYSTOSCOPY W/ RETROGRADES Bilateral 05/19/2015   Procedure: CYSTOSCOPY WITH RETROGRADE PYELOGRAM;  Surgeon: Hollice Espy, MD;  Location: [facility];  Service: Urology;  Laterality: Bilateral;   CYSTOSCOPY  W/ URETERAL STENT PLACEMENT Right 05/26/2015   Procedure: CYSTOSCOPY WITH STENT REPLACEMENT;  Surgeon: Hollice Espy, MD;  Location: [facility];  Service: Urology;  Laterality: Right;   CYSTOSCOPY WITH STENT PLACEMENT Right 05/19/2015   Procedure: CYSTOSCOPY WITH STENT PLACEMENT;  Surgeon: Hollice Espy, MD;  Location: [facility];  Service: Urology;  Laterality: Right;   CYSTOSCOPY WITH STENT PLACEMENT Right 05/26/2015   Procedure: CYSTOSCOPY WITH STENT PLACEMENT;  Surgeon: Hollice Espy, MD;   Location: [facility];  Service: Urology;  Laterality: Right;   CYSTOSCOPY WITH STENT PLACEMENT Right 06/25/2016   Procedure: CYSTOSCOPY WITH STENT PLACEMENT;  Surgeon: Nickie Retort, MD;  Location: [facility];  Service: Urology;  Laterality: Right;   CYSTOSCOPY WITH STENT PLACEMENT Left 10/31/2017   Procedure: CYSTOSCOPY WITH STENT PLACEMENT;  Surgeon: Hollice Espy, MD;  Location: [facility];  Service: Urology;  Laterality: Left;   CYSTOSCOPY/URETEROSCOPY/HOLMIUM LASER/STENT PLACEMENT Left 07/23/2015   Procedure: CYSTOSCOPY/URETEROSCOPY/HOLMIUM LASER/STENT PLACEMENT/RETROGRADE PYELOGRAM;  Surgeon: Hollice Espy, MD;  Location: [facility];  Service: Urology;  Laterality: Left;   CYSTOSCOPY/URETEROSCOPY/HOLMIUM LASER/STENT PLACEMENT Left 11/16/2017   Procedure: CYSTOSCOPY/URETEROSCOPY/HOLMIUM LASER/STENT Exchange;  Surgeon: Hollice Espy, MD;  Location: [facility];  Service: Urology;  Laterality: Left;   ESOPHAGOGASTRODUODENOSCOPY (EGD) WITH PROPOFOL N/A 04/25/2015   Procedure: ESOPHAGOGASTRODUODENOSCOPY (EGD) WITH PROPOFOL;  Surgeon: Josefine Class, MD;  Location: [facility] ENDOSCOPY;  Service: Endoscopy;  Laterality: N/A;   EYE SURGERY Bilateral 1964   lazy eye repair   EYE SURGERY Bilateral 2001   lazy eye repair   IR IMAGING GUIDED PORT INSERTION  12/08/2020   LITHOTRIPSY  2009   THYROIDECTOMY  2010   URETEROSCOPY WITH HOLMIUM LASER LITHOTRIPSY Right 05/19/2015   Procedure: URETEROSCOPY WITH HOLMIUM LASER LITHOTRIPSY;  Surgeon: Hollice Espy, MD;  Location: [facility];  Service: Urology;  Laterality: Right;   URETEROSCOPY WITH HOLMIUM LASER LITHOTRIPSY Right 05/26/2015   Procedure: URETEROSCOPY WITH HOLMIUM LASER LITHOTRIPSY;  Surgeon: Hollice Espy, MD;  Location: [facility];  Service: Urology;  Laterality: Right;   URETEROSCOPY WITH HOLMIUM LASER LITHOTRIPSY Right 06/25/2016   Procedure: URETEROSCOPY WITH HOLMIUM LASER LITHOTRIPSY;  Surgeon: Nickie Retort, MD;  Location: [facility];  Service:  Urology;  Laterality: Right;    Social History   Socioeconomic History   Marital status: Divorced    Spouse name: Not on file   Number of children: Not on file   Years of education: Not on file   Highest education level: Not on file  Occupational History   Not on file  Tobacco Use   Smoking status: Former    Packs/day: 1.00    Years: 50.00    Total pack years: 50.00    Types: Cigarettes    Quit date: 08/15/2021    Years since quitting: 0.5   Smokeless tobacco: Never   Tobacco comments:    Started smoking age 10  Vaping Use   Vaping Use: Former  Substance and Sexual Activity   Alcohol use: Not Currently    Comment: socially   Drug use: No   Sexual activity: Not Currently    Birth control/protection: None  Other Topics Concern   Not on file  Social History Narrative   Smoking: 1p 1-2 days since 17 years; alcohol: rare; 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     Social Determinants of Health   Financial Resource Strain: Not on file  Food Insecurity: No Food Insecurity (12/27/2021)   Hunger Vital Sign  Worried About Charity fundraiser in the Last Year: Never true    Ran Out of Food in the Last Year: Never true  Transportation Needs: Unmet Transportation Needs (02/09/2022)   PRAPARE - Hydrologist (Medical): Yes    Lack of Transportation (Non-Medical): Yes  Physical Activity: Not on file  Stress: Not on file  Social Connections: Not on file  Intimate Partner Violence: Not At Risk (12/27/2021)   Humiliation, Afraid, Rape, and Kick questionnaire    Fear of Current or Ex-Partner: No    Emotionally Abused: No    Physically Abused: No    Sexually Abused: No    Family History  Problem Relation Age of Onset   CAD Mother    Heart disease Mother    Dementia Father    Bladder Cancer Neg Hx    Prostate cancer Neg Hx    Kidney cancer Neg Hx    Breast cancer Neg Hx      Current Outpatient Medications:    Accu-Chek Softclix Lancets lancets, Check glucose once a day, Disp: 100 each, Rfl: 12   albuterol (VENTOLIN HFA) 108 (90 Base) MCG/ACT inhaler, Inhale 2 puffs into the lungs every 6 (six) hours as needed for wheezing or shortness of breath., Disp: 8 g, Rfl: 2   ARTIFICIAL TEAR OP, Apply to eye., Disp: , Rfl:    benzonatate (TESSALON PERLES) 100 MG capsule, Take 1 capsule (100 mg total) by mouth 3 (three) times daily as needed for cough., Disp: 90 capsule, Rfl: 1   blood glucose meter kit and supplies KIT, Dispense based on patient and insurance preference. Use up to three times daily as directed., Disp: 1 each, Rfl: 0   buPROPion (WELLBUTRIN SR) 150 MG 12 hr tablet, Take 150 mg by mouth daily., Disp: , Rfl:    cholecalciferol (VITAMIN D) 1000 units tablet, Take 2,000 Units by mouth daily., Disp: , Rfl:    folic acid (FOLVITE) 1 MG tablet, Take 1 tablet (1 mg total) by mouth daily., Disp: 90 tablet, Rfl: 1   gabapentin (NEURONTIN) 100 MG capsule, Take 2 capsules (200 mg total) by mouth 2 (two) times daily., Disp: 30 capsule, Rfl: 2   glipiZIDE (GLUCOTROL) 5 MG tablet, Take 1 tablet (5 mg total) by mouth 2 (two) times daily before a meal., Disp: 180 tablet, Rfl: 1   levothyroxine (SYNTHROID) 175 MCG tablet, Take 1 tablet (175 mcg total) by mouth daily before breakfast., Disp: 90 tablet, Rfl: 0   lidocaine (LIDODERM) 5 %, Place 1 patch onto the skin daily., Disp: 30 patch, Rfl: 0   lidocaine-prilocaine (EMLA) cream, Apply 1 application topically as needed., Disp: 30 g, Rfl: 0   magnesium hydroxide (MILK OF MAGNESIA) 400 MG/5ML suspension, Take 5 mLs by mouth daily as needed for mild constipation or moderate constipation., Disp: , Rfl:    metoprolol succinate (TOPROL-XL) 25 MG 24 hr tablet, Take 25 mg by mouth every morning. , Disp: , Rfl: 1   montelukast (SINGULAIR) 10 MG tablet, TAKE 1 TABLET BY MOUTH AT BEDTIME, Disp: 30 tablet, Rfl: 0   omeprazole (PRILOSEC)  20 MG capsule, Take 20 mg by mouth daily before breakfast. , Disp: , Rfl:    ondansetron (ZOFRAN) 8 MG tablet, Take 1 tablet (8 mg total) by mouth every 8 (eight) hours as needed for nausea or vomiting. Start on the third day after chemotherapy, Disp: 30 tablet, Rfl: 2   oxyCODONE (OXY IR/ROXICODONE) 5 MG immediate release tablet,  Take 1 tablet (5 mg total) by mouth every 4 (four) hours as needed for severe pain., Disp: 60 tablet, Rfl: 0   polyethylene glycol (MIRALAX / GLYCOLAX) 17 g packet, Take 17 g by mouth daily., Disp: , Rfl:    Cranberry-Vitamin C-Probiotic (AZO CRANBERRY PO), Take 1 tablet by mouth daily. (Patient not taking: Reported on 02/09/2022), Disp: , Rfl:    loperamide (IMODIUM) 2 MG capsule, Take by mouth as needed for diarrhea or loose stools. (Patient not taking: Reported on 01/26/2022), Disp: , Rfl:    prochlorperazine (COMPAZINE) 10 MG tablet, Take 1 tablet (10 mg total) by mouth every 6 (six) hours as needed for nausea or vomiting. (Patient not taking: Reported on 01/26/2022), Disp: 30 tablet, Rfl: 2 No current facility-administered medications for this visit.  Facility-Administered Medications Ordered in Other Visits:    heparin lock flush 100 UNIT/ML injection, , , ,    heparin lock flush 100 unit/mL, 500 Units, Intravenous, Once, Verlon Au, NP  Physical exam:  Vitals:   02/16/22 1244  BP: 110/72  Pulse: 76  Resp: (!) 22  Temp: 97.9 F (36.6 C)  TempSrc: Tympanic  SpO2: 98%   Physical Exam Constitutional:      General: She is not in acute distress.    Appearance: She is well-developed. She is not ill-appearing.     Comments: Fatigued appearing. Unaccompanied.   Cardiovascular:     Rate and Rhythm: Normal rate and regular rhythm.  Pulmonary:     Effort: Pulmonary effort is normal.     Comments: diminished Abdominal:     General: There is no distension.     Palpations: Abdomen is soft.     Tenderness: There is no abdominal tenderness.   Musculoskeletal:        General: No tenderness or deformity.  Skin:    General: Skin is warm and dry.     Coloration: Skin is not pale.  Neurological:     General: No focal deficit present.     Mental Status: She is alert and oriented to person, place, and time.  Psychiatric:        Mood and Affect: Mood normal.        Behavior: Behavior normal.         Latest Ref Rng & Units 02/16/2022   12:18 PM  CMP  Glucose 70 - 99 mg/dL 130   BUN 8 - 23 mg/dL 14   Creatinine 0.44 - 1.00 mg/dL 0.64   Sodium 135 - 145 mmol/L 137   Potassium 3.5 - 5.1 mmol/L 3.5   Chloride 98 - 111 mmol/L 101   CO2 22 - 32 mmol/L 26   Calcium 8.9 - 10.3 mg/dL 8.4   Total Protein 6.5 - 8.1 g/dL 6.8   Total Bilirubin 0.3 - 1.2 mg/dL 0.6   Alkaline Phos 38 - 126 U/L 122   AST 15 - 41 U/L 23   ALT 0 - 44 U/L 21       Latest Ref Rng & Units 02/16/2022   12:18 PM  CBC  WBC 4.0 - 10.5 K/uL 24.8   Hemoglobin 12.0 - 15.0 g/dL 10.5   Hematocrit 36.0 - 46.0 % 33.1   Platelets 150 - 400 K/uL 189    No images are attached to the encounter.  No results found.  Assessment and plan- Patient is a 67 y.o. female   Convalescence following chemotherapy- tolerating treatment moderately. IV fluids today. Hold steroids given use of tecentriq. Discussed  options for stimulants if needed. Prefers to hold for now. Can reconsider if needed. Encouraged control of pain as this can worsen fatigue and malaise. Recommend continuation of wellbutrin as atypical antidepressant that can have stimulant effect.  Grief- emotional support provided.   Follow up with Dr Rogue Bussing as scheduled or rtc sooner as needed - la   Visit Diagnosis 1. Convalescence following chemotherapy   2. Cancer of lower lobe of left lung ([place])   3. Grief     Patient expressed understanding and was in agreement with this plan. She also understands that She can call clinic at any time with any questions, concerns, or complaints.   Thank you for allowing  me to participate in the care of this very pleasant patient.   Beckey Rutter, DNP, AGNP-C [place] [facility] [place]

## 2022-02-16 NOTE — Telephone Encounter (Signed)
Patient called reporing that she feels she needs IV fluids today if at all possible , She is dizzy, weak and fatigued. She states after her treatment lat time she got IV fluids and it helped, and she had another treatment last week and wants them again She has to travel to [place] for her mothers funeral and would like to feel better for the trip if at all possible. Please Felicia Manning

## 2022-02-16 NOTE — Telephone Encounter (Signed)
Pt called this morning stating she isn't feeling well and wanted to see if she could be seen today and possible be giving fluids.

## 2022-02-16 NOTE — Telephone Encounter (Signed)
Pt added to lauren, nPs schedule-

## 2022-02-16 NOTE — Telephone Encounter (Signed)
Patient has already been scheduled for an apt today with Ander Purpura, NP- see phone note.

## 2022-02-17 NOTE — Progress Notes (Signed)
Enrolled patient into the [facility]

## 2022-02-19 ENCOUNTER — Encounter: Payer: Self-pay | Admitting: Internal Medicine

## 2022-02-23 ENCOUNTER — Encounter: Payer: Self-pay | Admitting: Internal Medicine

## 2022-02-23 ENCOUNTER — Other Ambulatory Visit: Payer: Medicaid Other

## 2022-02-23 DIAGNOSIS — Z7689 Persons encountering health services in other specified circumstances: Secondary | ICD-10-CM

## 2022-02-23 NOTE — Progress Notes (Signed)
Palliative care SW outreached patient per [facility] RN, Malachy Chamber. Request to assess mental health.  Patient shared that she has recently lost her mother a week before christmas and the loss is causing her a great amount of grief on top of her medical complexities and other life stressors. SW provided supportive and reflective listening. SW and patient discussed grief counseling as a support. Patient shared that she is interested in this. SW placed referral to [facility] grief counseling service. Patient and SW also discussed ongoing counseling options post grief counseling and informed patient that SW can provide her with local metal health providers when/if she is ready to purse those services.   Patient appreciative of call and support. Referral made to [facility] grief counselor.

## 2022-02-24 ENCOUNTER — Other Ambulatory Visit: Payer: Medicaid Other

## 2022-02-24 VITALS — BP 106/62 | HR 85 | Temp 97.6°F | Wt 169.0 lb

## 2022-02-24 DIAGNOSIS — Z515 Encounter for palliative care: Secondary | ICD-10-CM

## 2022-02-24 NOTE — Progress Notes (Signed)
PATIENT NAME: Felicia Manning DOB: 01-Mar-1955 MRN: [id number]  PRIMARY CARE PROVIDER: Dion Body, MD  RESPONSIBLE PARTY:  Acct ID - Guarantor Home Phone Work Phone Relationship Acct Type  [id number] GEORGEANNA, RADZIEWICZ* [phone]  Self P/F     [address], [place], [place] [id number]   ACP:  Patient openly talks about her situation and diagnosis.  She is realistic about her situation and states she is trying to "tie up lose ends".  Considering selling her home and renting something smaller that is one level.  Patient has a trip to [place] planned for next month and possibly [place] in March.  She has a CT scan scheduled for this week and is hopeful results will be positive.  DNR and MOST form in place.  Pain:  Continues with right hip pain and left rib cage pain.  Rib cage pain has improved significantly.  Recent increase in Gabapentin has also proved beneficial in managing pain.  Grief:  Patient states she has connected with PC SW to obtain grief counseling.  Patient shared her recent trip to [place] to attend her mother's funeral service.  Follow up visit scheduled for next month.   CODE STATUS: DNR ADVANCED DIRECTIVES: Yes MOST FORM: Yes PPS: 50%   PHYSICAL EXAM:   VITALS: Today's Vitals   02/24/22 1259  BP: 106/62  Pulse: 85  Temp: 97.6 F (36.4 C)  SpO2: 94%  Weight: 169 lb (76.7 kg)    LUNGS: clear to auscultation  CARDIAC: Cor RRR}  EXTREMITIES: - for edema SKIN: Skin color, texture, turgor normal. No rashes or lesions or mobility and turgor normal  NEURO: positive for gait problems       Lorenza Burton, RN

## 2022-02-25 ENCOUNTER — Encounter: Payer: Self-pay | Admitting: Internal Medicine

## 2022-02-25 ENCOUNTER — Other Ambulatory Visit: Payer: Self-pay | Admitting: *Deleted

## 2022-02-25 MED ORDER — GABAPENTIN 100 MG PO CAPS: 200.0000 mg | ORAL_CAPSULE | Freq: Two times a day (BID) | ORAL | 0 refills | Status: DC

## 2022-02-25 MED ORDER — OXYCODONE HCL 5 MG PO TABS: 5.0000 mg | ORAL_TABLET | ORAL | 0 refills | Status: DC | PRN

## 2022-02-26 ENCOUNTER — Inpatient Hospital Stay: Payer: Medicaid Other

## 2022-02-26 ENCOUNTER — Ambulatory Visit
Admission: RE | Admit: 2022-02-26 | Discharge: 2022-02-26 | Disposition: A | Discharge status: Home / Self Care | Payer: 59 | Source: Ambulatory Visit | Attending: Internal Medicine | Admitting: Internal Medicine

## 2022-02-26 DIAGNOSIS — C3432 Malignant neoplasm of lower lobe, left bronchus or lung: Secondary | ICD-10-CM | POA: Diagnosis present

## 2022-02-26 MED ORDER — IOHEXOL 300 MG/ML  SOLN
100.0000 mL | Freq: Once | INTRAMUSCULAR | Status: AC | PRN
  Administered 2022-02-26: 100 mL via INTRAVENOUS

## 2022-03-01 MED FILL — Dexamethasone Sodium Phosphate Inj 100 MG/10ML: INTRAMUSCULAR | Qty: 1 | Status: AC

## 2022-03-01 MED FILL — Fosaprepitant Dimeglumine For IV Infusion 150 MG (Base Eq): INTRAVENOUS | Qty: 5 | Status: AC

## 2022-03-02 ENCOUNTER — Encounter: Payer: Self-pay | Admitting: Internal Medicine

## 2022-03-02 ENCOUNTER — Inpatient Hospital Stay: Payer: 59

## 2022-03-02 ENCOUNTER — Other Ambulatory Visit: Payer: 59 | Admitting: Licensed Clinical Social Worker

## 2022-03-02 ENCOUNTER — Inpatient Hospital Stay (HOSPITAL_BASED_OUTPATIENT_CLINIC_OR_DEPARTMENT_OTHER): Payer: 59 | Admitting: Hospice and Palliative Medicine

## 2022-03-02 ENCOUNTER — Inpatient Hospital Stay (HOSPITAL_BASED_OUTPATIENT_CLINIC_OR_DEPARTMENT_OTHER): Payer: 59 | Admitting: Internal Medicine

## 2022-03-02 ENCOUNTER — Inpatient Hospital Stay: Payer: 59 | Admitting: Licensed Clinical Social Worker

## 2022-03-02 VITALS — BP 117/79 | HR 118 | Temp 98.2°F | Resp 18 | Ht 67.0 in | Wt 166.7 lb

## 2022-03-02 DIAGNOSIS — C3432 Malignant neoplasm of lower lobe, left bronchus or lung: Secondary | ICD-10-CM

## 2022-03-02 DIAGNOSIS — Z515 Encounter for palliative care: Secondary | ICD-10-CM

## 2022-03-02 DIAGNOSIS — Z95828 Presence of other vascular implants and grafts: Secondary | ICD-10-CM

## 2022-03-02 LAB — COMPREHENSIVE METABOLIC PANEL
ALT: 17 U/L (ref 0–44)
AST: 24 U/L (ref 15–41)
Albumin: 3.5 g/dL (ref 3.5–5.0)
Alkaline Phosphatase: 121 U/L (ref 38–126)
Anion gap: 11 (ref 5–15)
BUN: 8 mg/dL (ref 8–23)
CO2: 24 mmol/L (ref 22–32)
Calcium: 8.4 mg/dL — ABNORMAL LOW (ref 8.9–10.3)
Chloride: 102 mmol/L (ref 98–111)
Creatinine, Ser: 0.79 mg/dL (ref 0.44–1.00)
GFR, Estimated: >60 mL/min (ref >60)
Glucose, Bld: 168 mg/dL — ABNORMAL HIGH (ref 70–99)
Potassium: 3.6 mmol/L (ref 3.5–5.1)
Sodium: 137 mmol/L (ref 135–145)
Total Bilirubin: 0.2 mg/dL — ABNORMAL LOW (ref 0.3–1.2)
Total Protein: 6.9 g/dL (ref 6.5–8.1)

## 2022-03-02 LAB — CBC WITH DIFFERENTIAL/PLATELET
Abs Immature Granulocytes: 0.18 10*3/uL — ABNORMAL HIGH (ref 0.00–0.07)
Basophils Absolute: 0.1 10*3/uL (ref 0.0–0.1)
Basophils Relative: 1 %
Eosinophils Absolute: 0.1 10*3/uL (ref 0.0–0.5)
Eosinophils Relative: 0 %
HCT: 35.9 % — ABNORMAL LOW (ref 36.0–46.0)
Hemoglobin: 11.2 g/dL — ABNORMAL LOW (ref 12.0–15.0)
Immature Granulocytes: 1 %
Lymphocytes Relative: 12 %
Lymphs Abs: 1.5 10*3/uL (ref 0.7–4.0)
MCH: 32.6 pg (ref 26.0–34.0)
MCHC: 31.2 g/dL (ref 30.0–36.0)
MCV: 104.4 fL — ABNORMAL HIGH (ref 80.0–100.0)
Monocytes Absolute: 1.1 10*3/uL — ABNORMAL HIGH (ref 0.1–1.0)
Monocytes Relative: 9 %
Neutro Abs: 10.3 10*3/uL — ABNORMAL HIGH (ref 1.7–7.7)
Neutrophils Relative %: 77 %
Platelets: 194 10*3/uL (ref 150–400)
RBC: 3.44 MIL/uL — ABNORMAL LOW (ref 3.87–5.11)
RDW: 19.9 % — ABNORMAL HIGH (ref 11.5–15.5)
WBC: 13.3 10*3/uL — ABNORMAL HIGH (ref 4.0–10.5)
nRBC: 0.2 % (ref 0.0–0.2)

## 2022-03-02 MED ORDER — HEPARIN SOD (PORK) LOCK FLUSH 100 UNIT/ML IV SOLN
500.0000 [IU] | Freq: Once | INTRAVENOUS | Status: AC
  Administered 2022-03-02: 500 [IU] via INTRAVENOUS
  Filled 2022-03-02: qty 5

## 2022-03-02 MED ORDER — OXYCODONE HCL 10 MG PO TABS: 10.0000 mg | ORAL_TABLET | Freq: Three times a day (TID) | ORAL | 0 refills | Status: DC | PRN

## 2022-03-02 MED ORDER — FENTANYL 50 MCG/HR TD PT72: 1.0000 | MEDICATED_PATCH | TRANSDERMAL | 0 refills | Status: DC

## 2022-03-02 NOTE — Progress Notes (Signed)
DISCONTINUE ON PATHWAY REGIMEN - Small Cell Lung     Cycles 1 through 4, every 21 days:     Atezolizumab      Carboplatin      Etoposide    Cycles 5 and beyond, every 21 days:     Atezolizumab   **Always confirm dose/schedule in your pharmacy ordering system**  REASON: Disease Progression PRIOR TREATMENT: [id number]: Atezolizumab 1,200 mg D1 + Carboplatin AUC=5 D1 + Etoposide 100 mg/m2 D1-3 q21 Days x 4 Cycles, Followed by Atezolizumab 1,200 mg q21 Days Until Progression or Unacceptable Toxicity TREATMENT RESPONSE: Progressive Disease (PD)  START ON PATHWAY REGIMEN - Small Cell Lung     A cycle is every 21 days:     Lurbinectedin   **Always confirm dose/schedule in your pharmacy ordering system**  Patient Characteristics: Relapsed or Progressive Disease, Second Line, Relapse ? 6 Months Therapeutic Status: Relapsed or Progressive Disease Line of Therapy: Second Line Time to Relapse: Relapse ? 6 Months Intent of Therapy: Non-Curative / Palliative Intent, Discussed with Patient

## 2022-03-02 NOTE — Progress Notes (Signed)
Palliative Medicine [facility] [phone] [phone]   Name: Felicia Manning Date: 03/02/2022 MRN: [id number]  DOB: 05/13/1955  Patient Care Team: Marisue Ivan, MD as PCP - General (Family Medicine) Ivette Loyal, PA-C Byrnett, Merrily Pew, MD (General Surgery) Glory Buff, RN as Oncology Nurse Navigator Earna Coder, MD as Consulting Physician (Oncology)    REASON FOR CONSULTATION: Felicia Manning is a 67 y.o. female with multiple medical problems including including COPD, diabetes, and stage IV non-small cell lung cancer, initially diagnosed October 2022, on chemotherapy/immunotherapy.  Patient was referred to palliative care to discuss goals and provide ongoing support for symptoms.  SOCIAL HISTORY:     reports that she quit smoking about 6 months ago. Her smoking use included cigarettes. She has a 50.00 pack-year smoking history. She has never used smokeless tobacco. She reports that she does not currently use alcohol. She reports that she does not use drugs.  Patient is divorced.  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  Patient is a guardian of her 59 year old granddaughter.  Patient's son recently moved here from [place] to live with her.  Patient also has a son in [place], a daughter in [place], and another daughter in [place].  ADVANCE DIRECTIVES:  Does not have  CODE STATUS: DNR/DNI (MOST form completed on 01/28/21)  PAST MEDICAL HISTORY: Past Medical History:  Diagnosis Date   Barrett's esophagus    Cancer associated pain    COPD (chronic obstructive pulmonary disease) (HCC)    Dysrhythmia    stress related at work.   GERD (gastroesophageal reflux disease)    H/O malignant neoplasm of thyroid 01/29/2014   Hepatitis C 2008   Harvoni 2017   History of kidney stones    Hyperlipidemia    Hypertension    Hypothyroidism    Liver cancer (HCC)    metastasized from Lung    Nephrolithiasis    Non-small cell carcinoma of left lung (HCC)    Osteoporosis    Pulmonary mass    Thyroid cancer (HCC) 2012   Thyroid cancer (HCC)    Tumor    tumor per pt back of rt eye   Type 2 diabetes mellitus (HCC)    Wears dentures    Full upper and lower.  Usually only wears upper    PAST SURGICAL HISTORY:  Past Surgical History:  Procedure Laterality Date   ABDOMINAL HYSTERECTOMY  2006   CATARACT EXTRACTION W/PHACO Right 12/08/2021   Procedure: CATARACT EXTRACTION PHACO AND INTRAOCULAR LENS PLACEMENT (IOC) RIGHT DIABETIC 6.62 00:35.1;  Surgeon: Galen Manila, MD;  Location: [facility];  Service: Ophthalmology;  Laterality: Right;  Diabetic   CATARACT EXTRACTION W/PHACO Left 02/02/2022   Procedure: CATARACT EXTRACTION PHACO AND INTRAOCULAR LENS PLACEMENT (IOC) LEFT DIABETIC  5.33  00:30.3;  Surgeon: Galen Manila, MD;  Location: [facility];  Service: Ophthalmology;  Laterality: Left;  Diabetic   COLONOSCOPY WITH PROPOFOL N/A 04/25/2015   Procedure: COLONOSCOPY WITH PROPOFOL;  Surgeon: Elnita Maxwell, MD;  Location: [facility] ENDOSCOPY;  Service: Endoscopy;  Laterality: N/A;   CYSTOSCOPY W/ RETROGRADES Bilateral 05/19/2015   Procedure: CYSTOSCOPY WITH RETROGRADE PYELOGRAM;  Surgeon: Vanna Scotland, MD;  Location: [facility] ORS;  Service: Urology;  Laterality: Bilateral;   CYSTOSCOPY W/ URETERAL STENT PLACEMENT Right 05/26/2015   Procedure: CYSTOSCOPY WITH STENT REPLACEMENT;  Surgeon: Vanna Scotland, MD;  Location: [facility] ORS;  Service: Urology;  Laterality: Right;   CYSTOSCOPY WITH STENT PLACEMENT Right 05/19/2015  Procedure: CYSTOSCOPY WITH STENT PLACEMENT;  Surgeon: Vanna Scotland, MD;  Location: [facility] ORS;  Service: Urology;  Laterality: Right;   CYSTOSCOPY WITH STENT PLACEMENT Right 05/26/2015   Procedure: CYSTOSCOPY WITH STENT PLACEMENT;  Surgeon: Vanna Scotland, MD;  Location: [facility] ORS;  Service: Urology;  Laterality: Right;   CYSTOSCOPY WITH STENT PLACEMENT  Right 06/25/2016   Procedure: CYSTOSCOPY WITH STENT PLACEMENT;  Surgeon: Hildred Laser, MD;  Location: [facility] ORS;  Service: Urology;  Laterality: Right;   CYSTOSCOPY WITH STENT PLACEMENT Left 10/31/2017   Procedure: CYSTOSCOPY WITH STENT PLACEMENT;  Surgeon: Vanna Scotland, MD;  Location: [facility] ORS;  Service: Urology;  Laterality: Left;   CYSTOSCOPY/URETEROSCOPY/HOLMIUM LASER/STENT PLACEMENT Left 07/23/2015   Procedure: CYSTOSCOPY/URETEROSCOPY/HOLMIUM LASER/STENT PLACEMENT/RETROGRADE PYELOGRAM;  Surgeon: Vanna Scotland, MD;  Location: [facility] ORS;  Service: Urology;  Laterality: Left;   CYSTOSCOPY/URETEROSCOPY/HOLMIUM LASER/STENT PLACEMENT Left 11/16/2017   Procedure: CYSTOSCOPY/URETEROSCOPY/HOLMIUM LASER/STENT Exchange;  Surgeon: Vanna Scotland, MD;  Location: [facility] ORS;  Service: Urology;  Laterality: Left;   ESOPHAGOGASTRODUODENOSCOPY (EGD) WITH PROPOFOL N/A 04/25/2015   Procedure: ESOPHAGOGASTRODUODENOSCOPY (EGD) WITH PROPOFOL;  Surgeon: Elnita Maxwell, MD;  Location: [facility] ENDOSCOPY;  Service: Endoscopy;  Laterality: N/A;   EYE SURGERY Bilateral 1964   lazy eye repair   EYE SURGERY Bilateral 2001   lazy eye repair   IR IMAGING GUIDED PORT INSERTION  12/08/2020   LITHOTRIPSY  2009   THYROIDECTOMY  2010   URETEROSCOPY WITH HOLMIUM LASER LITHOTRIPSY Right 05/19/2015   Procedure: URETEROSCOPY WITH HOLMIUM LASER LITHOTRIPSY;  Surgeon: Vanna Scotland, MD;  Location: [facility] ORS;  Service: Urology;  Laterality: Right;   URETEROSCOPY WITH HOLMIUM LASER LITHOTRIPSY Right 05/26/2015   Procedure: URETEROSCOPY WITH HOLMIUM LASER LITHOTRIPSY;  Surgeon: Vanna Scotland, MD;  Location: [facility] ORS;  Service: Urology;  Laterality: Right;   URETEROSCOPY WITH HOLMIUM LASER LITHOTRIPSY Right 06/25/2016   Procedure: URETEROSCOPY WITH HOLMIUM LASER LITHOTRIPSY;  Surgeon: Hildred Laser, MD;  Location: [facility] ORS;  Service: Urology;  Laterality: Right;    HEMATOLOGY/ONCOLOGY HISTORY:  Oncology History Overview Note   IMPRESSION: Hypermetabolic solid pulmonary nodule of the left lower lobe, favor primary lung malignancy.   Numerous hypermetabolic nodules of the lower left pleura, compatible with pleural metastatic disease.   Hypermetabolic subcarinal and left hilar lymph nodes, compatible with metastatic disease.   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 Recommend attention on follow-up.   Mild asymmetric FDG uptake below mediastinal blood pool within a small nodule of the left parotid gland, favored to be benign. Recommend attention on follow-up.   No evidence of metastatic disease in the abdomen or pelvis.   No evidence of osseous metastatic disease.   ---------------------  # MARCH 2022- incidental LLL [ 0.9 cm] s/p COVID vaccine; [Dr.Aleskerov]; July 2022- left pleural pain-   #  #October 2022 MRI brain-punctate lesion-asymptomatic [D/w- Dr.Vaslow]; December 2022 MRI negative for any acute process; chronic sclerosis of hippocampus-clinically insignificant.  COPD-non-complaint    ------------------   DIAGNOSIS:  A. PLEURAL MASS, LEFT CHEST; BIOPSY:  - DIAGNOSTIC OF MALIGNANCY.  - NON-SMALL CELL CARCINOMA, FAVOR ADENOCARCINOMA.   Comment:  The tumor cells are positive for CK7 and TTF1 (patchy).  They are  negative for p40.   # 12/10/2020-carbo Alimta; cycle #1.    # JUNE 8th, 2023-  Enlarging left lower lobe pulmonary nodule seen on recent CT scan  is markedly hypermetabolic, consistent with neoplasm; Hypermetabolic nodal metastatic disease in the left hilum and central mediastinum. JUNE- 2023- RT 20 Fractions.  # NGS PD-L1 TPS 1% ------------------------ OCT 2023- liver Biopsy-positive for "small cell"/high-grade neuroendocrine-   # ? NOV 14th, 2023-  carbo etoposide-Tecentriq every  3 weeks x 4 cycles.     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HCC) (Resolved)  05/02/2015 Initial Diagnosis   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HCC)   Cancer of lower lobe of left lung (HCC)  12/02/2020 Initial Diagnosis   Cancer of lower lobe of left lung (HCC)   12/10/2020 Cancer Staging   Staging form: Lung, AJCC 8th Edition - Clinical: Stage IVA (cT4, cN3, cM1a) - Signed by Earna Coder, MD on 12/10/2020   12/10/2020 - 09/16/2021 Chemotherapy   Patient is on Treatment Plan : LUNG CARBOplatin / Pemetrexed / Pembrolizumab q21d Induction x 4 cycles / Maintenance Pemetrexed + Pembrolizumab     12/10/2020 - 11/18/2021 Chemotherapy   Patient is on Treatment Plan : LUNG Carboplatin (5) + Pemetrexed (500) + Pembrolizumab (200) D1 q21d Induction x 4 cycles / Maintenance Pemetrexed (500) + Pembrolizumab (200) D1 q21d     12/22/2021 - 02/12/2022 Chemotherapy   Patient is on Treatment Plan : LUNG SCLC Carboplatin + Etoposide + Atezolizumab Induction q21d x 4 cycles / Atezolizumab Maintenance q21d     03/17/2022 -  Chemotherapy   Patient is on Treatment Plan : LUNG SMALL CELL Lurbinectedin q21d       ALLERGIES:  has No Known Allergies.  MEDICATIONS:  Current Outpatient Medications  Medication Sig Dispense Refill   Accu-Chek Softclix Lancets lancets Check glucose once a day 100 each 12   albuterol (VENTOLIN HFA) 108 (90 Base) MCG/ACT inhaler Inhale 2 puffs into the lungs every 6 (six) hours as needed for wheezing or shortness of breath. (Patient not taking: Reported on 03/02/2022) 8 g 2   ARTIFICIAL TEAR OP Apply to eye.     benzonatate (TESSALON PERLES) 100 MG capsule Take 1 capsule (100 mg total) by mouth 3 (three) times daily as needed for cough. 90 capsule 1   blood glucose meter kit and supplies KIT Dispense based on patient and insurance preference. Use up to three times daily as directed. 1 each 0   buPROPion (WELLBUTRIN SR) 150 MG 12 hr tablet Take 150 mg by mouth daily.      cholecalciferol (VITAMIN D) 1000 units tablet Take 2,000 Units by mouth daily.     Cranberry-Vitamin C-Probiotic (AZO CRANBERRY PO) Take 1 tablet by mouth daily. (Patient not taking: Reported on 02/09/2022)     fentaNYL (DURAGESIC) 50 MCG/HR Place 1 patch onto the skin every 3 (three) days. 5 patch 0   folic acid (FOLVITE) 1 MG tablet Take 1 tablet (1 mg total) by mouth daily. 90 tablet 1   gabapentin (NEURONTIN) 100 MG capsule Take 2 capsules (200 mg total) by mouth 2 (two) times daily. 120 capsule 0   glipiZIDE (GLUCOTROL) 5 MG tablet Take 1 tablet (5 mg total) by mouth 2 (two) times daily before a meal. 180 tablet 1   ipratropium-albuterol (DUONEB) 0.5-2.5 (3) MG/3ML SOLN Inhale 3 mLs into the lungs 4 (four) times daily as needed.     levothyroxine (SYNTHROID) 175 MCG tablet Take 1 tablet (175 mcg total) by mouth daily before breakfast. 90 tablet 0   lidocaine (LIDODERM) 5 % Place 1 patch onto the skin daily. 30 patch 0   lidocaine-prilocaine (EMLA) cream Apply 1 application  topically as needed. 30 g 0   loperamide (IMODIUM) 2 MG capsule Take by mouth as needed for diarrhea or loose stools. (Patient not taking: Reported on 01/26/2022)     magnesium hydroxide (MILK OF MAGNESIA) 400 MG/5ML suspension Take 5 mLs by mouth daily as needed for mild constipation or moderate constipation.     metoprolol succinate (TOPROL-XL) 25 MG 24 hr tablet Take 25 mg by mouth every morning.   1   montelukast (SINGULAIR) 10 MG tablet TAKE 1 TABLET BY MOUTH AT BEDTIME 30 tablet 0   omeprazole (PRILOSEC) 20 MG capsule Take 20 mg by mouth daily before breakfast.      ondansetron (ZOFRAN) 8 MG tablet Take 1 tablet (8 mg total) by mouth every 8 (eight) hours as needed for nausea or vomiting. Start on the third day after chemotherapy 30 tablet 2   Oxycodone HCl 10 MG TABS Take 1 tablet (10 mg total) by mouth every 8 (eight) hours as needed. 90 tablet 0   polyethylene glycol (MIRALAX / GLYCOLAX) 17 g packet Take 17 g by  mouth daily.     prochlorperazine (COMPAZINE) 10 MG tablet Take 1 tablet (10 mg total) by mouth every 6 (six) hours as needed for nausea or vomiting. (Patient not taking: Reported on 01/26/2022) 30 tablet 2   No current facility-administered medications for this visit.   Facility-Administered Medications Ordered in Other Visits  Medication Dose Route Frequency Provider Last Rate Last Admin   heparin lock flush 100 UNIT/ML injection             VITAL SIGNS: There were no vitals taken for this visit. There were no vitals filed for this visit.  Estimated body mass index is 26.11 kg/m as calculated from the following:   Height as of an earlier encounter on 03/02/22: 5\' 7"  (1.702 m).   Weight as of an earlier encounter on 03/02/22: 166 lb 11.2 oz (75.6 kg).  LABS: CBC:    Component Value Date/Time   WBC 13.3 (H) 03/02/2022 0904   HGB 11.2 (L) 03/02/2022 0904   HCT 35.9 (L) 03/02/2022 0904   PLT 194 03/02/2022 0904   MCV 104.4 (H) 03/02/2022 0904   NEUTROABS 10.3 (H) 03/02/2022 0904   LYMPHSABS 1.5 03/02/2022 0904   MONOABS 1.1 (H) 03/02/2022 0904   EOSABS 0.1 03/02/2022 0904   BASOSABS 0.1 03/02/2022 0904   Comprehensive Metabolic Panel:    Component Value Date/Time   NA 137 03/02/2022 0904   K 3.6 03/02/2022 0904   CL 102 03/02/2022 0904   CO2 24 03/02/2022 0904   BUN 8 03/02/2022 0904   CREATININE 0.79 03/02/2022 0904   GLUCOSE 168 (H) 03/02/2022 0904   CALCIUM 8.4 (L) 03/02/2022 0904   AST 24 03/02/2022 0904   ALT 17 03/02/2022 0904   ALKPHOS 121 03/02/2022 0904   BILITOT 0.2 (L) 03/02/2022 0904   PROT 6.9 03/02/2022 0904   ALBUMIN 3.5 03/02/2022 0904    RADIOGRAPHIC STUDIES: CT CHEST ABDOMEN PELVIS W CONTRAST  Result Date: 02/26/2022 CLINICAL DATA:  Small cell lung cancer. Assess treatment response. Chemotherapy and immunotherapy. Liver metastasis. * Tracking Code: BO * EXAM: CT CHEST, ABDOMEN, AND PELVIS WITH CONTRAST TECHNIQUE: Multidetector CT imaging of the  chest, abdomen and pelvis was performed following the standard protocol during bolus administration of intravenous contrast. RADIATION DOSE REDUCTION: This exam was performed according to the departmental dose-optimization program which includes automated exposure control, adjustment of the mA and/or kV according to patient size and/or  use of iterative reconstruction technique. CONTRAST:  OMNIPAQUE IOHEXOL 300 MG/ML  SOLN COMPARISON:  CT chest abdomen pelvis 11/19/2021 FINDINGS: CT CHEST FINDINGS Cardiovascular: No significant vascular findings. Normal heart size. No pericardial effusion. Mediastinum/Nodes: No axillary or supraclavicular adenopathy. No mediastinal or hilar adenopathy. No pericardial fluid. Esophagus normal. Lungs/Pleura: Bilateral ground-glass nodules again noted. Example nodule in the RIGHT upper lobe measures 10 mm (image 35/3 compared to 11 mm. LEFT upper lobe nodule measuring 7 mm (image 47/3 compares to 8 mm. RIGHT lower lobe ground-glass nodule measuring 13 mm (74/3 compares to 12 mm Measured focus consolidation in the LEFT lower lobe measures 31 x 27 mm compared to 27 x 17 mm. Musculoskeletal: No acute osseous abnormality. CT ABDOMEN PELVIS FINDINGS Hepatobiliary: Progression of hepatic metastasis compared to CT 11/19/2001. Lesion in the medial aspect of the lateral segment LEFT hepatic lobe along the falciform ligament measures 42 x 45 mm compared to 13 x 13 mm Lesion in the posterior RIGHT hepatic lobe measuring 37 mm (52/2) compares to 10 mm. Lesion along the gallbladder fossa measuring 20 mm (65/2 compares to 3 mm Pancreas: Pancreas is normal. No ductal dilatation. No pancreatic inflammation. Spleen: Normal spleen Adrenals/urinary tract: Adrenal glands normal. Bilateral nonenhancing renal cyst considered benign. No follow-up recommended. Ureters and bladder normal. Stomach/Bowel: The stomach, duodenum, and small bowel normal. Multiple diverticula of the descending colon and sigmoid  colon without acute inflammation. Vascular/Lymphatic: Abdominal aorta is calcified but nonaneurysmal. No fluid collection along the LEFT psoas muscles unchanged (92/)2 no pathologic enlarged retroperitoneal lymph nodes. Reproductive: Post hysterectomy.  Adnexa unremarkable Other: No free fluid. Musculoskeletal: No fracture of the tibia or fibula. Knee joint and ankle joint appear normal on two views. Subtle sclerotic lesions the lumbar spine new from comparison exam. For example subtle sclerotic lesions in the L2 and L3 vertebral body seen on sagittal image 105/6) IMPRESSION: CHEST IMPRESSION: 1. Stable bilateral ground-glass nodules. 2. Larger focus of consolidation in the LEFT lower lobe. Potential post treatment affect. 3. No mediastinal adenopathy. PELVIS IMPRESSION: 1. Marked progression of hepatic metastasis. 2. Concern for new sclerotic metastasis in the lumbar spine. 3.  Aortic Atherosclerosis (ICD10-I70.0). Electronically Signed   By: Genevive Bi M.D.   On: 02/26/2022 17:09    PERFORMANCE STATUS (ECOG) : 1 - Symptomatic but completely ambulatory  Review of Systems Unless otherwise noted, a complete review of systems is negative.  Physical Exam General: NAD Cardiovascular: regular rate and rhythm Pulmonary: clear ant fields Extremities: no edema, no joint deformities Skin: no rashes Neurological: Grossly nonfocal  IMPRESSION: Patient was an add-on to my clinic schedule today at Dr. Sharlette Dense request.  Unfortunately, CT of the chest abdomen and pelvis on 02/26/2022 revealed marked progression of hepatic metastasis and concern for new sclerotic metastasis in the lumbar spine.  Dr. Donneta Romberg has discussed starting next line chemotherapy but patient is unsure if she wants to pursue additional chemotherapy.  I met with patient and son today.  Patient thinks that she would like to try additional chemotherapy but reserves the right to discontinue if poor tolerance.  We discussed the  option now or in the future for hospice if patient decides to forego treatments.  Will send referral to social work to discuss home resources.  Patient confirms DNR  PLAN: -Continue current scope of treatment -Referral to social work -DNR -RTC follow-up telephone visit 1 month   Patient expressed understanding and was in agreement with this plan. She also understands that She can call the  clinic at any time with any questions, concerns, or complaints.     Time Total: 15 minutes  Visit consisted of counseling and education dealing with the complex and emotionally intense issues of symptom management and palliative care in the setting of serious and potentially life-threatening illness.Greater than 50%  of this time was spent counseling and coordinating care related to the above assessment and plan.  Signed by: Laurette Schimke, PhD, NP-C

## 2022-03-02 NOTE — Assessment & Plan Note (Addendum)
#  Stage IV -SMALL CELL lung cancer [s/p liver Bx-; Previously on-small cell favor adenocarcinoma]-currently on carboplatin etoposide plus Tecentriq every 3 weeks. Currently s/p cycle #3 carbo-Etop-Tecentriq- JAN 12th, 2024- CT CAP- Stable bilateral ground-glass nodules; Larger focus of consolidation in the LEFT lower lobe. Potential post treatment affect; No mediastinal adenopathy. AP CT: Marked progression of hepatic metastasis; Concern for new sclerotic metastasis in the lumbar spine.  # DISCONTINUE carbo-Etop-Tecentriq given the progression of disease. Discussed unfortunately given the progression of disease/refractory disease-prognosis is unfortunately poor.   # Discussed the use of Lubrinectidin-second line setting small cell lung cancer.  Based on platinum sensitivity or resistance /refractoriness response rates are about 20- 40%-in the median survival is anywhere between 6 to 12 months.  However, a big factor playing into her survival would be her ability to tolerate chemotherapy.  She understands that she has been on chemotherapy for almost a year and a half since October 2022.  She understands high risk of complications from chemotherapy.  Also discussed about possibility of hospice if patient chooses to forego any therapy.  In the absence of any chemotherapy the average survival would be less than 6 months if lung cancer/liver mets take the natural course.  # Bilateral rib pain- bone scan- OCT 2023- No scintigraphic evidence of bone metastasis.  ? Sec to liver disease;  Right sciatica/ radiates down the leg, but given worsening left rib pain- contiue neurontin 200 mg  BID.  Increase the Oxycodone 10 mg q 8 hours [also directed that patient will take half a pill every 8 hours as needed; Fentanyl patch. 50 mcg q 72 hours.  Again discussed regarding avoiding constipation/drowsiness with narcotics.    # COPD [Dr.A]/allergies likely secondary to poorly controlled COPD. Continue using albuterol 3-4 times  a day and also compliance with Advair.  continue Singulair- stable.  # Right eye vision changes-3.6 mm lesion noted in the right eye;s/p ophthalmology evaluation at [facility]. 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 JUlY 2023- s/p revaluation with [facility] opth end of month.  Given the worsening liver disease I think is reasonable to hold off further evaluation at this time, especially as patient vision is stable.  # Diabetes-A1c 6.9 [AUG 2022]-PBF- 164- Continue glipizide 5 mg BID stable.   # Cough secondary to radiation-currently on Tessalon Perles.stable.   # Hypocalcemia: on Vit D BID [8.6; ca-kidney stones].  JUNE  VitD levels-48.  stable.  # Mediport placement-s/p dye study c tPA-functioning.stable.  # OSTEOPOROSIS [Dr.O'conell]- reclast- plan once chemo is done.   #DISPOSITION: # cancel chemo today; this week; De-Access # follow up in 2 weeks; MD- Andrey Farmer- cbc/cmp;Lubrinectidin-. Dr.B  # I reviewed the blood work- with the patient in detail; also reviewed the imaging independently [as summarized above]; and with the patient in detail.   # 40 minutes face-to-face with the patient discussing the above plan of care; more than 50% of time spent on prognosis/ natural history; counseling and coordination.

## 2022-03-02 NOTE — Progress Notes (Signed)
Bilateral rib, back , and leg pain with 3/10 pain scale today.  Using Lidocaine patches in alternating areas of pain with some relief.    New issues swallowing cold liquids  Stable appetite with 3 lb wt loss  HR 118 today, BP 117/79  Constipation caused by pain medication is well controlled with Dulcolax chews.  Does have problems sleeping at night but usually takes pain med to help her sleep.

## 2022-03-02 NOTE — Progress Notes (Signed)
Per MD no treatment today, port deacccessed. Patient discharged. No concerns voiced.

## 2022-03-02 NOTE — Progress Notes (Signed)
[name]  Patient Care Team: Marisue Ivan, MD as PCP - General (Family Medicine) Ivette Loyal, PA-C Byrnett, Merrily Pew, MD (General Surgery) Glory Buff, RN as Oncology Nurse Navigator Earna Coder, MD as Consulting Physician (Oncology)  CHIEF COMPLAINTS/PURPOSE OF CONSULTATION: lung cancer  Oncology History Overview Note  IMPRESSION: Hypermetabolic solid pulmonary nodule of the left lower lobe, favor primary lung malignancy.   Numerous hypermetabolic nodules of the lower left pleura, compatible with pleural metastatic disease.   Hypermetabolic subcarinal and left hilar lymph nodes, compatible with metastatic disease.   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 Recommend attention on follow-up.   Mild asymmetric FDG uptake below mediastinal blood pool within a small nodule of the left parotid gland, favored to be benign. Recommend attention on follow-up.   No evidence of metastatic disease in the abdomen or pelvis.   No evidence of osseous metastatic disease.   ---------------------  # MARCH 2022- incidental LLL [ 0.9 cm] s/p COVID vaccine; [Dr.Aleskerov]; July 2022- left pleural pain-   #  #October 2022 MRI brain-punctate lesion-asymptomatic [D/w- Dr.Vaslow]; December 2022 MRI negative for any acute process; chronic sclerosis of hippocampus-clinically insignificant.  COPD-non-complaint    ------------------   DIAGNOSIS:  A. PLEURAL MASS, LEFT CHEST; BIOPSY:  - DIAGNOSTIC OF MALIGNANCY.  - NON-SMALL CELL CARCINOMA, FAVOR ADENOCARCINOMA.   Comment:  The tumor cells are positive for CK7 and TTF1 (patchy).  They are  negative for p40.   # 12/10/2020-carbo Alimta; cycle #1.    # JUNE 8th, 2023-  Enlarging left lower  lobe pulmonary nodule seen on recent CT scan is markedly hypermetabolic, consistent with neoplasm; Hypermetabolic nodal metastatic disease in the left hilum and central mediastinum. JUNE- 2023- RT 20 Fractions.  # NGS PD-L1 TPS 1% ------------------------ OCT 2023- liver Biopsy-positive for "small cell"/high-grade neuroendocrine-   # ? NOV 14th, 2023-  carbo etoposide-Tecentriq every 3 weeks x 4 cycles.     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HCC) (Resolved)  05/02/2015 Initial Diagnosis   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HCC)   Cancer of lower lobe of left lung (HCC)  12/02/2020 Initial Diagnosis   Cancer of lower lobe of left lung (HCC)   12/10/2020 Cancer Staging   Staging form: Lung, AJCC 8th Edition - Clinical: Stage IVA (cT4, cN3, cM1a) - Signed by Earna Coder, MD on 12/10/2020   12/10/2020 - 09/16/2021 Chemotherapy   Patient is on Treatment Plan : LUNG CARBOplatin / Pemetrexed / Pembrolizumab q21d Induction x 4 cycles / Maintenance Pemetrexed + Pembrolizumab     12/10/2020 - 11/18/2021 Chemotherapy   Patient is on Treatment Plan : LUNG Carboplatin (5) + Pemetrexed (500) + Pembrolizumab (200) D1 q21d Induction x 4 cycles / Maintenance Pemetrexed (500) + Pembrolizumab (200) D1 q21d     12/22/2021 - 02/12/2022 Chemotherapy   Patient is on Treatment Plan : LUNG SCLC Carboplatin + Etoposide + Atezolizumab Induction q21d x 4 cycles / Atezolizumab Maintenance q21d     03/17/2022 -  Chemotherapy   Patient is on Treatment Plan : LUNG SMALL CELL Lurbinectedin q21d      HISTORY OF PRESENTING ILLNESS: Ambulating independently.  Accompanied by her son.   Felicia Manning 67 y.o.  female history of smoking-metastatic adeno ca lung cancer currently transformed/SMALL CELL LUNG CA [s/p liver biopsy] chemoimmunotherapy  is here for follow-up./Review results of the CT scan.  Patient currently status post cycle # 3 of carboplatin-etoposide-Tecentriq approximately 3 weeks ago;   Patient  reports new pain to left side, was cleaning the counters in her kitchen and felt a pop along her side and under arm. Pain has continued in that area, patient reports old rib fracture to same area as pain.  She does complain of ongoing fatigue.  Patient reports minimal shortness of breath. Patient currently on  5-10 mg oxycodone tablets every 4 hours also.  She is not on any long-acting pain medication.  Patient's right hip pain stable improved with gabapentin. Mild diarrhea- mild nausea currently improved with antiemetics.  No weight loss. 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   Review of Systems  Constitutional:  Positive for malaise/fatigue. Negative for chills, diaphoresis, fever and weight loss.  HENT:  Negative for nosebleeds and sore throat.   Eyes:  Negative for double vision.  Respiratory:  Positive for cough and sputum production. Negative for hemoptysis and shortness of breath.   Cardiovascular:  Negative for palpitations, orthopnea and leg swelling.  Gastrointestinal:  Negative for abdominal pain, blood in stool, constipation, diarrhea, heartburn, melena, nausea and vomiting.  Genitourinary:  Negative for dysuria, frequency and urgency.  Musculoskeletal:  Negative for back pain and joint pain.  Skin: Negative.  Negative for itching and rash.  Neurological:  Negative for dizziness, tingling, focal weakness and weakness.  Endo/Heme/Allergies:  Does not bruise/bleed easily.  Psychiatric/Behavioral:  Negative for depression. The patient is not nervous/anxious and does not have insomnia.      MEDICAL HISTORY:  Past Medical History:  Diagnosis Date   Barrett's esophagus    Cancer associated pain    COPD (chronic obstructive pulmonary disease) (HCC)    Dysrhythmia    stress related at work.   GERD (gastroesophageal reflux disease)    H/O malignant neoplasm of thyroid 01/29/2014   Hepatitis C 2008   Harvoni 2017   History of kidney stones    Hyperlipidemia    Hypertension     Hypothyroidism    Liver cancer (HCC)    metastasized from Lung   Nephrolithiasis    Non-small cell carcinoma of left lung (HCC)    Osteoporosis    Pulmonary mass    Thyroid cancer (HCC) 2012   Thyroid cancer (HCC)    Tumor    tumor per pt back of rt eye   Type 2 diabetes mellitus (HCC)    Wears dentures    Full upper and lower.  Usually only wears upper    SURGICAL HISTORY: Past Surgical History:  Procedure Laterality Date   ABDOMINAL HYSTERECTOMY  2006   CATARACT EXTRACTION W/PHACO Right 12/08/2021   Procedure: CATARACT EXTRACTION PHACO AND INTRAOCULAR LENS PLACEMENT (IOC) RIGHT DIABETIC 6.62 00:35.1;  Surgeon: Galen Manila, MD;  Location: [facility];  Service: Ophthalmology;  Laterality: Right;  Diabetic   CATARACT EXTRACTION W/PHACO Left 02/02/2022   Procedure: CATARACT EXTRACTION PHACO AND INTRAOCULAR LENS PLACEMENT (IOC) LEFT DIABETIC  5.33  00:30.3;  Surgeon: Galen Manila, MD;  Location: [facility];  Service: Ophthalmology;  Laterality: Left;  Diabetic   COLONOSCOPY WITH PROPOFOL N/A 04/25/2015   Procedure: COLONOSCOPY WITH PROPOFOL;  Surgeon: Elnita Maxwell, MD;  Location: [facility];  Service: Endoscopy;  Laterality: N/A;   CYSTOSCOPY W/ RETROGRADES Bilateral 05/19/2015   Procedure: CYSTOSCOPY WITH RETROGRADE PYELOGRAM;  Surgeon: Vanna Scotland, MD;  Location: [facility];  Service: Urology;  Laterality: Bilateral;  CYSTOSCOPY W/ URETERAL STENT PLACEMENT Right 05/26/2015   Procedure: CYSTOSCOPY WITH STENT REPLACEMENT;  Surgeon: Vanna Scotland, MD;  Location: [facility];  Service: Urology;  Laterality: Right;   CYSTOSCOPY WITH STENT PLACEMENT Right 05/19/2015   Procedure: CYSTOSCOPY WITH STENT PLACEMENT;  Surgeon: Vanna Scotland, MD;  Location: [facility];  Service: Urology;  Laterality: Right;   CYSTOSCOPY WITH STENT PLACEMENT Right 05/26/2015   Procedure: CYSTOSCOPY WITH STENT PLACEMENT;  Surgeon: Vanna Scotland, MD;  Location: [facility];  Service:  Urology;  Laterality: Right;   CYSTOSCOPY WITH STENT PLACEMENT Right 06/25/2016   Procedure: CYSTOSCOPY WITH STENT PLACEMENT;  Surgeon: Hildred Laser, MD;  Location: [facility];  Service: Urology;  Laterality: Right;   CYSTOSCOPY WITH STENT PLACEMENT Left 10/31/2017   Procedure: CYSTOSCOPY WITH STENT PLACEMENT;  Surgeon: Vanna Scotland, MD;  Location: [facility];  Service: Urology;  Laterality: Left;   CYSTOSCOPY/URETEROSCOPY/HOLMIUM LASER/STENT PLACEMENT Left 07/23/2015   Procedure: CYSTOSCOPY/URETEROSCOPY/HOLMIUM LASER/STENT PLACEMENT/RETROGRADE PYELOGRAM;  Surgeon: Vanna Scotland, MD;  Location: [facility];  Service: Urology;  Laterality: Left;   CYSTOSCOPY/URETEROSCOPY/HOLMIUM LASER/STENT PLACEMENT Left 11/16/2017   Procedure: CYSTOSCOPY/URETEROSCOPY/HOLMIUM LASER/STENT Exchange;  Surgeon: Vanna Scotland, MD;  Location: [facility];  Service: Urology;  Laterality: Left;   ESOPHAGOGASTRODUODENOSCOPY (EGD) WITH PROPOFOL N/A 04/25/2015   Procedure: ESOPHAGOGASTRODUODENOSCOPY (EGD) WITH PROPOFOL;  Surgeon: Elnita Maxwell, MD;  Location: [facility];  Service: Endoscopy;  Laterality: N/A;   EYE SURGERY Bilateral 1964   lazy eye repair   EYE SURGERY Bilateral 2001   lazy eye repair   IR IMAGING GUIDED PORT INSERTION  12/08/2020   LITHOTRIPSY  2009   THYROIDECTOMY  2010   URETEROSCOPY WITH HOLMIUM LASER LITHOTRIPSY Right 05/19/2015   Procedure: URETEROSCOPY WITH HOLMIUM LASER LITHOTRIPSY;  Surgeon: Vanna Scotland, MD;  Location: [facility];  Service: Urology;  Laterality: Right;   URETEROSCOPY WITH HOLMIUM LASER LITHOTRIPSY Right 05/26/2015   Procedure: URETEROSCOPY WITH HOLMIUM LASER LITHOTRIPSY;  Surgeon: Vanna Scotland, MD;  Location: [facility];  Service: Urology;  Laterality: Right;   URETEROSCOPY WITH HOLMIUM LASER LITHOTRIPSY Right 06/25/2016   Procedure: URETEROSCOPY WITH HOLMIUM LASER LITHOTRIPSY;  Surgeon: Hildred Laser, MD;  Location: [facility];  Service: Urology;  Laterality: Right;     SOCIAL HISTORY: Social History   Socioeconomic History   Marital status: Divorced    Spouse name: Not on file   Number of children: Not on file   Years of education: Not on file   Highest education level: Not on file  Occupational History   Not on file  Tobacco Use   Smoking status: Former    Packs/day: 1.00    Years: 50.00    Total pack years: 50.00    Types: Cigarettes    Quit date: 08/15/2021    Years since quitting: 0.5   Smokeless tobacco: Never   Tobacco comments:    Started smoking age 52  Vaping Use   Vaping Use: Former  Substance and Sexual Activity   Alcohol use: Not Currently    Comment: socially   Drug use: No   Sexual activity: Not Currently    Birth control/protection: None  Other Topics Concern   Not on file  Social History Narrative   Smoking: 1p 1-2 days since 17 years; alcohol: rare; 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     Social Determinants of Health   Financial Resource Strain: Not on file  Food Insecurity: No Food Insecurity (12/27/2021)   Hunger Vital Sign  Worried About Programme researcher, broadcasting/film/video in the Last Year: Never true    Ran Out of Food in the Last Year: Never true  Transportation Needs: Unmet Transportation Needs (03/02/2022)   PRAPARE - Administrator, Civil Service (Medical): Yes    Lack of Transportation (Non-Medical): Yes  Physical Activity: Not on file  Stress: Not on file  Social Connections: Not on file  Intimate Partner Violence: Not At Risk (12/27/2021)   Humiliation, Afraid, Rape, and Kick questionnaire    Fear of Current or Ex-Partner: No    Emotionally Abused: No    Physically Abused: No    Sexually Abused: No    FAMILY HISTORY: Family History  Problem Relation Age of Onset   CAD Mother    Heart disease Mother    Dementia Father    Bladder Cancer Neg Hx    Prostate cancer Neg Hx    Kidney cancer Neg Hx    Breast cancer Neg  Hx     ALLERGIES:  has No Known Allergies.  MEDICATIONS:  Current Outpatient Medications  Medication Sig Dispense Refill   Accu-Chek Softclix Lancets lancets Check glucose once a day 100 each 12   ARTIFICIAL TEAR OP Apply to eye.     benzonatate (TESSALON PERLES) 100 MG capsule Take 1 capsule (100 mg total) by mouth 3 (three) times daily as needed for cough. 90 capsule 1   blood glucose meter kit and supplies KIT Dispense based on patient and insurance preference. Use up to three times daily as directed. 1 each 0   buPROPion (WELLBUTRIN SR) 150 MG 12 hr tablet Take 150 mg by mouth daily.     cholecalciferol (VITAMIN D) 1000 units tablet Take 2,000 Units by mouth daily.     fentaNYL (DURAGESIC) 50 MCG/HR Place 1 patch onto the skin every 3 (three) days. 5 patch 0   folic acid (FOLVITE) 1 MG tablet Take 1 tablet (1 mg total) by mouth daily. 90 tablet 1   gabapentin (NEURONTIN) 100 MG capsule Take 2 capsules (200 mg total) by mouth 2 (two) times daily. 120 capsule 0   glipiZIDE (GLUCOTROL) 5 MG tablet Take 1 tablet (5 mg total) by mouth 2 (two) times daily before a meal. 180 tablet 1   ipratropium-albuterol (DUONEB) 0.5-2.5 (3) MG/3ML SOLN Inhale 3 mLs into the lungs 4 (four) times daily as needed.     levothyroxine (SYNTHROID) 175 MCG tablet Take 1 tablet (175 mcg total) by mouth daily before breakfast. 90 tablet 0   lidocaine (LIDODERM) 5 % Place 1 patch onto the skin daily. 30 patch 0   lidocaine-prilocaine (EMLA) cream Apply 1 application topically as needed. 30 g 0   magnesium hydroxide (MILK OF MAGNESIA) 400 MG/5ML suspension Take 5 mLs by mouth daily as needed for mild constipation or moderate constipation.     metoprolol succinate (TOPROL-XL) 25 MG 24 hr tablet Take 25 mg by mouth every morning.   1   montelukast (SINGULAIR) 10 MG tablet TAKE 1 TABLET BY MOUTH AT BEDTIME 30 tablet 0   omeprazole (PRILOSEC) 20 MG capsule Take 20 mg by mouth daily before breakfast.      ondansetron  (ZOFRAN) 8 MG tablet Take 1 tablet (8 mg total) by mouth every 8 (eight) hours as needed for nausea or vomiting. Start on the third day after chemotherapy 30 tablet 2   Oxycodone HCl 10 MG TABS Take 1 tablet (10 mg total) by mouth every 8 (eight) hours as needed.  90 tablet 0   polyethylene glycol (MIRALAX / GLYCOLAX) 17 g packet Take 17 g by mouth daily.     albuterol (VENTOLIN HFA) 108 (90 Base) MCG/ACT inhaler Inhale 2 puffs into the lungs every 6 (six) hours as needed for wheezing or shortness of breath. (Patient not taking: Reported on 03/02/2022) 8 g 2   Cranberry-Vitamin C-Probiotic (AZO CRANBERRY PO) Take 1 tablet by mouth daily. (Patient not taking: Reported on 02/09/2022)     loperamide (IMODIUM) 2 MG capsule Take by mouth as needed for diarrhea or loose stools. (Patient not taking: Reported on 01/26/2022)     prochlorperazine (COMPAZINE) 10 MG tablet Take 1 tablet (10 mg total) by mouth every 6 (six) hours as needed for nausea or vomiting. (Patient not taking: Reported on 01/26/2022) 30 tablet 2   No current facility-administered medications for this visit.   Facility-Administered Medications Ordered in Other Visits  Medication Dose Route Frequency Provider Last Rate Last Admin   heparin lock flush 100 UNIT/ML injection               .  PHYSICAL EXAMINATION: ECOG PERFORMANCE STATUS: 1 - Symptomatic but completely ambulatory  Vitals:   03/02/22 0900  BP: 117/79  Pulse: (!) 118  Resp: 18  Temp: 98.2 F (36.8 C)  SpO2: 95%     Filed Weights   03/02/22 0900  Weight: 166 lb 11.2 oz (75.6 kg)    Physical Exam Vitals and nursing note reviewed.  HENT:     Head: Normocephalic and atraumatic.     Mouth/Throat:     Pharynx: Oropharynx is clear.  Eyes:     Extraocular Movements: Extraocular movements intact.     Pupils: Pupils are equal, round, and reactive to light.  Cardiovascular:     Rate and Rhythm: Normal rate and regular rhythm.  Pulmonary:     Comments: Decreased  breath sounds bilaterally.  Abdominal:     Palpations: Abdomen is soft.  Musculoskeletal:        General: Normal range of motion.     Cervical back: Normal range of motion.  Skin:    General: Skin is warm.  Neurological:     General: No focal deficit present.     Mental Status: She is alert and oriented to person, place, and time.  Psychiatric:        Behavior: Behavior normal.        Judgment: Judgment normal.      LABORATORY DATA:  I have reviewed the data as listed Lab Results  Component Value Date   WBC 13.3 (H) 03/02/2022   HGB 11.2 (L) 03/02/2022   HCT 35.9 (L) 03/02/2022   MCV 104.4 (H) 03/02/2022   PLT 194 03/02/2022   Recent Labs    02/09/22 0911 02/16/22 1218 03/02/22 0904  NA 140 137 137  K 3.5 3.5 3.6  CL 107 101 102  CO2 27 26 24   GLUCOSE 165* 130* 168*  BUN 8 14 8   CREATININE 0.72 0.64 0.79  CALCIUM 8.7* 8.4* 8.4*  GFRNONAA >60 >60 >60  PROT 6.6 6.8 6.9  ALBUMIN 3.4* 3.7 3.5  AST 24 23 24   ALT 16 21 17   ALKPHOS 108 122 121  BILITOT 0.3 0.6 0.2*    RADIOGRAPHIC STUDIES: I have personally reviewed the radiological images as listed and agreed with the findings in the report. CT CHEST ABDOMEN PELVIS W CONTRAST  Result Date: 02/26/2022 CLINICAL DATA:  Small cell lung cancer. Assess treatment response. Chemotherapy and  immunotherapy. Liver metastasis. * Tracking Code: BO * EXAM: CT CHEST, ABDOMEN, AND PELVIS WITH CONTRAST TECHNIQUE: Multidetector CT imaging of the chest, abdomen and pelvis was performed following the standard protocol during bolus administration of intravenous contrast. RADIATION DOSE REDUCTION: This exam was performed according to the departmental dose-optimization program which includes automated exposure control, adjustment of the mA and/or kV according to patient size and/or use of iterative reconstruction technique. CONTRAST:  OMNIPAQUE IOHEXOL 300 MG/ML  SOLN COMPARISON:  CT chest abdomen pelvis 11/19/2021 FINDINGS: CT CHEST  FINDINGS Cardiovascular: No significant vascular findings. Normal heart size. No pericardial effusion. Mediastinum/Nodes: No axillary or supraclavicular adenopathy. No mediastinal or hilar adenopathy. No pericardial fluid. Esophagus normal. Lungs/Pleura: Bilateral ground-glass nodules again noted. Example nodule in the RIGHT upper lobe measures 10 mm (image 35/3 compared to 11 mm. LEFT upper lobe nodule measuring 7 mm (image 47/3 compares to 8 mm. RIGHT lower lobe ground-glass nodule measuring 13 mm (74/3 compares to 12 mm Measured focus consolidation in the LEFT lower lobe measures 31 x 27 mm compared to 27 x 17 mm. Musculoskeletal: No acute osseous abnormality. CT ABDOMEN PELVIS FINDINGS Hepatobiliary: Progression of hepatic metastasis compared to CT 11/19/2001. Lesion in the medial aspect of the lateral segment LEFT hepatic lobe along the falciform ligament measures 42 x 45 mm compared to 13 x 13 mm Lesion in the posterior RIGHT hepatic lobe measuring 37 mm (52/2) compares to 10 mm. Lesion along the gallbladder fossa measuring 20 mm (65/2 compares to 3 mm Pancreas: Pancreas is normal. No ductal dilatation. No pancreatic inflammation. Spleen: Normal spleen Adrenals/urinary tract: Adrenal glands normal. Bilateral nonenhancing renal cyst considered benign. No follow-up recommended. Ureters and bladder normal. Stomach/Bowel: The stomach, duodenum, and small bowel normal. Multiple diverticula of the descending colon and sigmoid colon without acute inflammation. Vascular/Lymphatic: Abdominal aorta is calcified but nonaneurysmal. No fluid collection along the LEFT psoas muscles unchanged (92/)2 no pathologic enlarged retroperitoneal lymph nodes. Reproductive: Post hysterectomy.  Adnexa unremarkable Other: No free fluid. Musculoskeletal: No fracture of the tibia or fibula. Knee joint and ankle joint appear normal on two views. Subtle sclerotic lesions the lumbar spine new from comparison exam. For example subtle  sclerotic lesions in the L2 and L3 vertebral body seen on sagittal image 105/6) IMPRESSION: CHEST IMPRESSION: 1. Stable bilateral ground-glass nodules. 2. Larger focus of consolidation in the LEFT lower lobe. Potential post treatment affect. 3. No mediastinal adenopathy. PELVIS IMPRESSION: 1. Marked progression of hepatic metastasis. 2. Concern for new sclerotic metastasis in the lumbar spine. 3.  Aortic Atherosclerosis (ICD10-I70.0). Electronically Signed   By: Genevive Bi M.D.   On: 02/26/2022 17:09    ASSESSMENT & PLAN:   Cancer of lower lobe of left lung (HCC) #Stage IV -SMALL CELL lung cancer [s/p liver Bx-; Previously on-small cell favor adenocarcinoma]-currently on carboplatin etoposide plus Tecentriq every 3 weeks. Currently s/p cycle #3 carbo-Etop-Tecentriq- JAN 12th, 2024- CT CAP- Stable bilateral ground-glass nodules; Larger focus of consolidation in the LEFT lower lobe. Potential post treatment affect; No mediastinal adenopathy. AP CT: Marked progression of hepatic metastasis; Concern for new sclerotic metastasis in the lumbar spine.  # DISCONTINUE carbo-Etop-Tecentriq given the progression of disease. Discussed unfortunately given the progression of disease/refractory disease-prognosis is unfortunately poor.   # Discussed the use of Lubrinectidin-second line setting small cell lung cancer.  Based on platinum sensitivity or resistance /refractoriness response rates are about 20- 40%-in the median survival is anywhere between 6 to 12 months.  However, a big factor  playing into her survival would be her ability to tolerate chemotherapy.  She understands that she has been on chemotherapy for almost a year and a half since October 2022.  She understands high risk of complications from chemotherapy.  Also discussed about possibility of hospice if patient chooses to forego any therapy.  In the absence of any chemotherapy the average survival would be less than 6 months if lung cancer/liver mets  take the natural course.  # Bilateral rib pain- bone scan- OCT 2023- No scintigraphic evidence of bone metastasis.  ? Sec to liver disease;  Right sciatica/ radiates down the leg, but given worsening left rib pain- contiue neurontin 200 mg  BID.  Increase the Oxycodone 10 mg q 8 hours [also directed that patient will take half a pill every 8 hours as needed; Fentanyl patch. 50 mcg q 72 hours.  Again discussed regarding avoiding constipation/drowsiness with narcotics.    # COPD [Dr.A]/allergies likely secondary to poorly controlled COPD. Continue using albuterol 3-4 times a day and also compliance with Advair.  continue Singulair- stable.  # Right eye vision changes-3.6 mm lesion noted in the right eye;s/p ophthalmology evaluation at [facility]. 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 JUlY 2023- s/p revaluation with [facility] opth end of month.  Given the worsening liver disease I think is reasonable to hold off further evaluation at this time, especially as patient vision is stable.  # Diabetes-A1c 6.9 [AUG 2022]-PBF- 164- Continue glipizide 5 mg BID stable.   # Cough secondary to radiation-currently on Tessalon Perles.stable.   # Hypocalcemia: on Vit D BID [8.6; ca-kidney stones].  JUNE  VitD levels-48.  stable.  # Mediport placement-s/p dye study c tPA-functioning.stable.  # OSTEOPOROSIS [Dr.O'conell]- reclast- plan once chemo is done.   #DISPOSITION: # cancel chemo today; this week; De-Access # follow up in 2 weeks; MD- Andrey Farmer- cbc/cmp;Lubrinectidin-. Dr.B  # I reviewed the blood work- with the patient in detail; also reviewed the imaging independently [as summarized above]; and with the patient in detail.   # 40 minutes face-to-face with the patient discussing the above plan of care; more than 50% of time spent on prognosis/ natural history; counseling and coordination.      All  questions were answered. The patient knows to call the clinic with any problems, questions or concerns.       Earna Coder, MD 03/02/2022 1:23 PM

## 2022-03-03 ENCOUNTER — Encounter: Payer: Self-pay | Admitting: Internal Medicine

## 2022-03-03 ENCOUNTER — Inpatient Hospital Stay: Payer: 59

## 2022-03-03 NOTE — Progress Notes (Signed)
[facility] Clinical Social Work  Initial Assessment   Felicia Manning is a 67 y.o. year old female contacted by phone. Clinical Social Work was referred by medical provider for assessment of psychosocial needs.   SDOH (Social Determinants of Health) assessments performed: Yes SDOH Interventions    Flowsheet Row Office Visit from 03/02/2022 in [facility] [place]-Medical Oncology Appointment from 02/09/2022 in [facility] [place]-Medical Oncology Documentation from 10/02/2021 in [facility] [place]-Radiation Oncology Documentation from 09/02/2021 in [facility] at [place]-Radiation Oncology  SDOH Interventions      Transportation Interventions [facility] Zenaida Niece ([facility] Only)  Dallie Piles provided] [facility] Zenaida Niece ([facility] Only) [facility] Zenaida Niece ([facility] Only) [facility] Zenaida Niece ([facility] Only)       SDOH Screenings   Food Insecurity: No Food Insecurity (12/27/2021)  Housing: Low Risk  (12/27/2021)  Transportation Needs: Unmet Transportation Needs (03/02/2022)  Utilities: Not At Risk (12/27/2021)  Tobacco Use: Medium Risk (03/02/2022)     Distress Screen completed: No    11/14/2020    9:16 AM  ONCBCN DISTRESS SCREENING  Screening Type Initial Screening  Distress experienced in past week (1-10) 4      Family/Social Information:  Housing Arrangement: patient lives alone.  One son lives in [place], and her daughter lives six blocks away with her family.  Patient has two other children in [place].  Patient is no longer the guardian of her 1 year old granddaughter, who moved back with her mother to [place]. Family members/support persons in your life? Family Transportation concerns: yes  Employment: Unemployed  Income source: Secretary/administrator concerns: Yes, due to illness and/or loss of work during treatment Type of concern: Medical bills Food access concerns: no Religious or spiritual practice: Not known Services  Currently in place:  [employer].  Patient stated she does not have Medicaid.  Coping/ Adjustment to diagnosis: Patient understands treatment plan and what happens next? yes Concerns about diagnosis and/or treatment: How I will pay for the services I need Patient reported stressors: Adjusting to my illness Hopes and/or priorities: To remain as independent as possible. Patient enjoys time with family/ friends Current coping skills/ strengths: Capable of independent living , Communication skills , General fund of knowledge , and Supportive family/friends     SUMMARY: Current SDOH Barriers:  Financial constraints related to fixed income.  Clinical Social Work Clinical Goal(s):  Explore community resource options for unmet needs related to:  Housing Patient said she wants to sell her house and move to a [employer].  Interventions: Discussed common feeling and emotions when being diagnosed with cancer, and the importance of support during treatment Informed patient of the support team roles and support services at [facility] Provided CSW contact information and encouraged patient to call with any questions or concerns Provided patient with information about available services.  Per patient's request, CSW will email her information about house cleaning, meal delivery and senior housing in [place].   Follow Up Plan: CSW will follow-up with patient by phone  Patient verbalizes understanding of plan: Yes    Pascal Lux Kearie Mennen, LCSW

## 2022-03-04 ENCOUNTER — Inpatient Hospital Stay: Payer: 59

## 2022-03-04 ENCOUNTER — Other Ambulatory Visit: Payer: Medicaid Other

## 2022-03-04 ENCOUNTER — Ambulatory Visit: Payer: 59

## 2022-03-04 DIAGNOSIS — Z515 Encounter for palliative care: Secondary | ICD-10-CM

## 2022-03-04 NOTE — Progress Notes (Signed)
PATIENT NAME: Felicia Manning DOB: 1956-02-02 MRN: [id number]  PRIMARY CARE PROVIDER: Marisue Ivan, MD  RESPONSIBLE PARTY:  Acct ID - Guarantor Home Phone Work Phone Relationship Acct Type  [id number] JAYNELL, CASTAGNOLA* [phone]  Self P/F     [address], [place], [place] [address]   I connected with  Claudean Kinds on 03/04/22 by telephone and verified that I am speaking with the correct person using two identifiers.   I discussed the limitations of evaluation and management by telemedicine. The patient expressed understanding and agreed to proceed.   Received message from patient that she needed to cancel our appointment for February.  Return call made and spoke with patient who provides an update on her recent visit to oncology.  Cancer has progressed and she has been offered another round of chemotherapy.  Patient states at this time she will likely not pursue any further chemotherapy.  She is aware that she will qualify for hospice support.  Patient has moved her planned tripped to [place] and [place] up from March to February at the urging of her son.  Patient will see her oncology on 03/17/22 and notify them of her decision.  She will likely be ready to admit to hospice once she returns from her trip.  Patient agreeable to have a telephonic visit around the first of next month.   Truitt Merle, RN

## 2022-03-05 ENCOUNTER — Inpatient Hospital Stay: Payer: 59

## 2022-03-08 ENCOUNTER — Encounter: Payer: Self-pay | Admitting: Internal Medicine

## 2022-03-08 NOTE — Telephone Encounter (Signed)
resolved 

## 2022-03-09 NOTE — Progress Notes (Signed)
Pharmacist Chemotherapy Monitoring - Initial Assessment    Anticipated start date: 03/17/22   The following has been reviewed per standard work regarding the patient's treatment regimen: The patient's diagnosis, treatment plan and drug doses, and organ/hematologic function Lab orders and baseline tests specific to treatment regimen  The treatment plan start date, drug sequencing, and pre-medications Prior authorization status  Patient's documented medication list, including drug-drug interaction screen and prescriptions for anti-emetics and supportive care specific to the treatment regimen The drug concentrations, fluid compatibility, administration routes, and timing of the medications to be used The patient's access for treatment and lifetime cumulative dose history, if applicable  The patient's medication allergies and previous infusion related reactions, if applicable   Changes made to treatment plan:  N/A  Follow up needed:  N/A   Stephens Shire, [facility], 03/09/2022  3:51 PM

## 2022-03-11 ENCOUNTER — Telehealth: Payer: Self-pay

## 2022-03-11 NOTE — Telephone Encounter (Signed)
Patient states that she would like to talk with Dr B or his nurse. Call back # is [phone]

## 2022-03-11 NOTE — Telephone Encounter (Signed)
Patient states that she has decided she doesn't want to proceed with anymore chemo. She would like to talk to someone on her care team about what the next steps would be going forward.

## 2022-03-15 ENCOUNTER — Encounter: Payer: Self-pay | Admitting: Licensed Clinical Social Worker

## 2022-03-15 NOTE — Progress Notes (Signed)
[facility] CSW Progress Note  Clinical Education officer, museum contacted patient by phone to discuss assistance needs.  CSW left voicemail with contact information and for return call.  FA  Shayaan Parke, LCSW

## 2022-03-16 MED FILL — Dexamethasone Sodium Phosphate Inj 100 MG/10ML: INTRAMUSCULAR | Qty: 1 | Status: AC

## 2022-03-17 ENCOUNTER — Inpatient Hospital Stay (HOSPITAL_BASED_OUTPATIENT_CLINIC_OR_DEPARTMENT_OTHER): Payer: 59 | Admitting: Internal Medicine

## 2022-03-17 ENCOUNTER — Inpatient Hospital Stay: Payer: 59

## 2022-03-17 ENCOUNTER — Other Ambulatory Visit: Payer: Self-pay | Admitting: *Deleted

## 2022-03-17 ENCOUNTER — Inpatient Hospital Stay (HOSPITAL_BASED_OUTPATIENT_CLINIC_OR_DEPARTMENT_OTHER): Payer: 59 | Admitting: Hospice and Palliative Medicine

## 2022-03-17 ENCOUNTER — Inpatient Hospital Stay: Payer: 59 | Admitting: Internal Medicine

## 2022-03-17 VITALS — BP 121/83 | HR 110 | Temp 99.0°F | Resp 18 | Wt 160.4 lb

## 2022-03-17 DIAGNOSIS — C3432 Malignant neoplasm of lower lobe, left bronchus or lung: Secondary | ICD-10-CM

## 2022-03-17 MED ORDER — FENTANYL 50 MCG/HR TD PT72: 1.0000 | MEDICATED_PATCH | TRANSDERMAL | 0 refills | Status: DC

## 2022-03-17 MED ORDER — GABAPENTIN 100 MG PO CAPS: 200.0000 mg | ORAL_CAPSULE | Freq: Two times a day (BID) | ORAL | 0 refills | Status: AC

## 2022-03-17 MED ORDER — MONTELUKAST SODIUM 10 MG PO TABS: 10.0000 mg | ORAL_TABLET | Freq: Every day | ORAL | 2 refills | Status: DC

## 2022-03-17 MED ORDER — RIVAROXABAN 10 MG PO TABS: 10.0000 mg | ORAL_TABLET | Freq: Every day | ORAL | 0 refills | Status: DC

## 2022-03-17 MED ORDER — GABAPENTIN 100 MG PO CAPS: 200.0000 mg | ORAL_CAPSULE | Freq: Two times a day (BID) | ORAL | 0 refills | Status: DC

## 2022-03-17 NOTE — Progress Notes (Signed)
[place] at [facility] [phone] [phone]) [phone]   Name: Felicia Manning Date: 03/17/2022 MRN: [id number]  DOB: 1955-06-29  Patient Care Team: Dion Body, MD as PCP - General (Family Medicine) Leata Mouse, PA-C Byrnett, Forest Gleason, MD (General Surgery) Telford Nab, RN as Oncology Nurse Navigator Cammie Sickle, MD as Consulting Physician (Oncology)    REASON FOR CONSULTATION: Felicia Manning is a 67 y.o. female with multiple medical problems including including COPD, diabetes, and stage IV non-small cell lung cancer, initially diagnosed October 2022, on chemotherapy/immunotherapy.  Patient was referred to palliative care to discuss goals and provide ongoing support for symptoms.  SOCIAL HISTORY:     reports that she quit smoking about 7 months ago. Her smoking use included cigarettes. She has a 50.00 pack-year smoking history. She has never used smokeless tobacco. She reports that she does not currently use alcohol. She reports that she does not use drugs.  Patient is divorced.  She is a Landscape architect for [employer].  Patient is a guardian of her 65 year old granddaughter.  Patient's son recently moved here from [place] to live with her.  Patient also has a son in [place], a daughter in [place], and another daughter in [place].  ADVANCE DIRECTIVES:  Does not have  CODE STATUS: DNR/DNI (MOST form completed on 01/28/21)  PAST MEDICAL HISTORY: Past Medical History:  Diagnosis Date   Barrett's esophagus    Cancer associated pain    COPD (chronic obstructive pulmonary disease) (HCC)    Dysrhythmia    stress related at work.   GERD (gastroesophageal reflux disease)    H/O malignant neoplasm of thyroid 01/29/2014   Hepatitis C 2008   Harvoni 2017   History of kidney stones    Hyperlipidemia    Hypertension    Hypothyroidism    Liver cancer ([place])    metastasized from Lung    Nephrolithiasis    Non-small cell carcinoma of left lung ([place])    Osteoporosis    Pulmonary mass    Thyroid cancer ([place]) 2012   Thyroid cancer ([place])    Tumor    tumor per pt back of rt eye   Type 2 diabetes mellitus ([place])    Wears dentures    Full upper and lower.  Usually only wears upper    PAST SURGICAL HISTORY:  Past Surgical History:  Procedure Laterality Date   ABDOMINAL HYSTERECTOMY  2006   CATARACT EXTRACTION W/PHACO Right 12/08/2021   Procedure: CATARACT EXTRACTION PHACO AND INTRAOCULAR LENS PLACEMENT (IOC) RIGHT DIABETIC 6.62 00:35.1;  Surgeon: Birder Robson, MD;  Location: [facility];  Service: Ophthalmology;  Laterality: Right;  Diabetic   CATARACT EXTRACTION W/PHACO Left 02/02/2022   Procedure: CATARACT EXTRACTION PHACO AND INTRAOCULAR LENS PLACEMENT (IOC) LEFT DIABETIC  5.33  00:30.3;  Surgeon: Birder Robson, MD;  Location: [place];  Service: Ophthalmology;  Laterality: Left;  Diabetic   COLONOSCOPY WITH PROPOFOL N/A 04/25/2015   Procedure: COLONOSCOPY WITH PROPOFOL;  Surgeon: Josefine Class, MD;  Location: [facility] END[facility]COPY;  Service: Endoscopy;  Laterality: N/A;   CYSTOSCOPY W/ RETROGRADES Bilateral 05/19/2015   Procedure: CYSTOSCOPY WITH RETROGRADE PYELOGRAM;  Surgeon: Hollice Espy, MD;  Location: [facility];  Service: Urology;  Laterality: Bilateral;   CYSTOSCOPY W/ URETERAL STENT PLACEMENT Right 05/26/2015   Procedure: CYSTOSCOPY WITH STENT REPLACEMENT;  Surgeon: Hollice Espy, MD;  Location: [facility];  Service: Urology;  Laterality: Right;   CYSTOSCOPY WITH STENT PLACEMENT Right 05/19/2015  Procedure: CYSTOSCOPY WITH STENT PLACEMENT;  Surgeon: Hollice Espy, MD;  Location: [facility];  Service: Urology;  Laterality: Right;   CYSTOSCOPY WITH STENT PLACEMENT Right 05/26/2015   Procedure: CYSTOSCOPY WITH STENT PLACEMENT;  Surgeon: Hollice Espy, MD;  Location: [facility];  Service: Urology;  Laterality: Right;   CYSTOSCOPY WITH STENT PLACEMENT  Right 06/25/2016   Procedure: CYSTOSCOPY WITH STENT PLACEMENT;  Surgeon: Nickie Retort, MD;  Location: [facility];  Service: Urology;  Laterality: Right;   CYSTOSCOPY WITH STENT PLACEMENT Left 10/31/2017   Procedure: CYSTOSCOPY WITH STENT PLACEMENT;  Surgeon: Hollice Espy, MD;  Location: [facility];  Service: Urology;  Laterality: Left;   CYSTOSCOPY/URETEROSCOPY/HOLMIUM LASER/STENT PLACEMENT Left 07/23/2015   Procedure: CYSTOSCOPY/URETEROSCOPY/HOLMIUM LASER/STENT PLACEMENT/RETROGRADE PYELOGRAM;  Surgeon: Hollice Espy, MD;  Location: [facility];  Service: Urology;  Laterality: Left;   CYSTOSCOPY/URETEROSCOPY/HOLMIUM LASER/STENT PLACEMENT Left 11/16/2017   Procedure: CYSTOSCOPY/URETEROSCOPY/HOLMIUM LASER/STENT Exchange;  Surgeon: Hollice Espy, MD;  Location: [facility];  Service: Urology;  Laterality: Left;   ESOPHAGOGASTRODUODENOSCOPY (EGD) WITH PROPOFOL N/A 04/25/2015   Procedure: ESOPHAGOGASTRODUODENOSCOPY (EGD) WITH PROPOFOL;  Surgeon: Josefine Class, MD;  Location: [facility]COPY;  Service: Endoscopy;  Laterality: N/A;   EYE SURGERY Bilateral 1964   lazy eye repair   EYE SURGERY Bilateral 2001   lazy eye repair   IR IMAGING GUIDED PORT INSERTION  12/08/2020   LITHOTRIPSY  2009   THYROIDECTOMY  2010   URETEROSCOPY WITH HOLMIUM LASER LITHOTRIPSY Right 05/19/2015   Procedure: URETEROSCOPY WITH HOLMIUM LASER LITHOTRIPSY;  Surgeon: Hollice Espy, MD;  Location: [facility];  Service: Urology;  Laterality: Right;   URETEROSCOPY WITH HOLMIUM LASER LITHOTRIPSY Right 05/26/2015   Procedure: URETEROSCOPY WITH HOLMIUM LASER LITHOTRIPSY;  Surgeon: Hollice Espy, MD;  Location: [facility];  Service: Urology;  Laterality: Right;   URETEROSCOPY WITH HOLMIUM LASER LITHOTRIPSY Right 06/25/2016   Procedure: URETEROSCOPY WITH HOLMIUM LASER LITHOTRIPSY;  Surgeon: Nickie Retort, MD;  Location: [facility];  Service: Urology;  Laterality: Right;    HEMATOLOGY/ONCOLOGY HISTORY:  Oncology History Overview Note   IMPRESSION: Hypermetabolic solid pulmonary nodule of the left lower lobe, favor primary lung malignancy.   Numerous hypermetabolic nodules of the lower left pleura, compatible with pleural metastatic disease.   Hypermetabolic subcarinal and left hilar lymph nodes, compatible with metastatic disease.   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 Recommend attention on follow-up.   Mild asymmetric FDG uptake below mediastinal blood pool within a small nodule of the left parotid gland, favored to be benign. Recommend attention on follow-up.   No evidence of metastatic disease in the abdomen or pelvis.   No evidence of osseous metastatic disease.   ---------------------  # MARCH 2022- incidental LLL [ 0.9 cm] s/p COVID vaccine; [Dr.Aleskerov]; July 2022- left pleural pain-   #  #October 2022 MRI brain-punctate lesion-asymptomatic [D/w- Dr.Vaslow]; December 2022 MRI negative for any acute process; chronic sclerosis of hippocampus-clinically insignificant.  COPD-non-complaint    ------------------   DIAGNOSIS:  A. PLEURAL MASS, LEFT CHEST; BIOPSY:  - DIAGNOSTIC OF MALIGNANCY.  - NON-SMALL CELL CARCINOMA, FAVOR ADENOCARCINOMA.   Comment:  The tumor cells are positive for CK7 and TTF1 (patchy).  They are  negative for p40.   # 12/10/2020-carbo Alimta; cycle #1.    # JUNE 8th, 2023-  Enlarging left lower lobe pulmonary nodule seen on recent CT scan  is markedly hypermetabolic, consistent with neoplasm; Hypermetabolic nodal metastatic disease in the left hilum and central mediastinum. JUNE- 2023- RT 20 Fractions.  # NGS PD-L1 TPS 1% ------------------------ OCT 2023- liver Biopsy-positive for "small cell"/high-grade neuroendocrine-   # ? NOV 14th, 2023-  carbo etoposide-Tecentriq every  3 weeks x 4 cycles.     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[place]) (Resolved)  05/02/2015 Initial Diagnosis   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[place])   Cancer of lower lobe of left lung ([place])  12/02/2020 Initial Diagnosis   Cancer of lower lobe of left lung ([place])   12/10/2020 Cancer Staging   Staging form: Lung, AJCC 8th Edition - Clinical: Stage IVA (cT4, cN3, cM1a) - Signed by Cammie Sickle, MD on 12/10/2020   12/10/2020 - 09/16/2021 Chemotherapy   Patient is on Treatment Plan : LUNG CARBOplatin / Pemetrexed / Pembrolizumab q21d Induction x 4 cycles / Maintenance Pemetrexed + Pembrolizumab     12/10/2020 - 11/18/2021 Chemotherapy   Patient is on Treatment Plan : LUNG Carboplatin (5) + Pemetrexed (500) + Pembrolizumab (200) D1 q21d Induction x 4 cycles / Maintenance Pemetrexed (500) + Pembrolizumab (200) D1 q21d     12/22/2021 - 02/12/2022 Chemotherapy   Patient is on Treatment Plan : LUNG SCLC Carboplatin + Etoposide + Atezolizumab Induction q21d x 4 cycles / Atezolizumab Maintenance q21d     03/17/2022 -  Chemotherapy   Patient is on Treatment Plan : LUNG SMALL CELL Lurbinectedin q21d       ALLERGIES:  has No Known Allergies.  MEDICATIONS:  Current Outpatient Medications  Medication Sig Dispense Refill   Accu-Chek Softclix Lancets lancets Check glucose once a day 100 each 12   albuterol (VENTOLIN HFA) 108 (90 Base) MCG/ACT inhaler Inhale 2 puffs into the lungs every 6 (six) hours as needed for wheezing or shortness of breath. 8 g 2   benzonatate (TESSALON PERLES) 100 MG capsule Take 1 capsule (100 mg total) by mouth 3 (three) times daily as needed for cough. 90 capsule 1   blood glucose meter kit and supplies KIT Dispense based on patient and insurance preference. Use up to three times daily as directed. 1 each 0   buPROPion (WELLBUTRIN SR) 150 MG 12 hr tablet Take 150 mg by mouth daily.     cholecalciferol (VITAMIN D) 1000 units tablet Take 2,000 Units by mouth daily.      Cranberry-Vitamin C-Probiotic (AZO CRANBERRY PO) Take 1 tablet by mouth daily.     fentaNYL (DURAGESIC) 50 MCG/HR Place 1 patch onto the skin every 3 (three) days. 10 patch 0   folic acid (FOLVITE) 1 MG tablet Take 1 tablet (1 mg total) by mouth daily. 90 tablet 1   gabapentin (NEURONTIN) 100 MG capsule Take 2 capsules (200 mg total) by mouth 2 (two) times daily. 120 capsule 0   glipiZIDE (GLUCOTROL) 5 MG tablet Take 1 tablet (5 mg total) by mouth 2 (two) times daily before a meal. 180 tablet 1   ipratropium-albuterol (DUONEB) 0.5-2.5 (3) MG/3ML SOLN Inhale 3 mLs into the lungs 4 (four) times daily as needed.     levothyroxine (SYNTHROID) 175 MCG tablet Take 1 tablet (175 mcg total) by mouth daily before breakfast. 90 tablet 0   lidocaine (LIDODERM) 5 % Place 1 patch onto the skin daily. 30 patch 0   lidocaine-prilocaine (EMLA) cream Apply 1 application topically as needed. 30 g 0   loperamide (IMODIUM) 2 MG capsule Take by mouth as needed for diarrhea or loose  stools. (Patient not taking: Reported on 01/26/2022)     magnesium hydroxide (MILK OF MAGNESIA) 400 MG/5ML suspension Take 5 mLs by mouth daily as needed for mild constipation or moderate constipation.     metoprolol succinate (TOPROL-XL) 25 MG 24 hr tablet Take 25 mg by mouth every morning.   1   montelukast (SINGULAIR) 10 MG tablet Take 1 tablet (10 mg total) by mouth at bedtime. 30 tablet 2   omeprazole (PRILOSEC) 20 MG capsule Take 20 mg by mouth daily before breakfast.      ondansetron (ZOFRAN) 8 MG tablet Take 1 tablet (8 mg total) by mouth every 8 (eight) hours as needed for nausea or vomiting. Start on the third day after chemotherapy 30 tablet 2   Oxycodone HCl 10 MG TABS Take 1 tablet (10 mg total) by mouth every 8 (eight) hours as needed. 90 tablet 0   polyethylene glycol (MIRALAX / GLYCOLAX) 17 g packet Take 17 g by mouth daily.     prochlorperazine (COMPAZINE) 10 MG tablet Take 1 tablet (10 mg total) by mouth every 6 (six)  hours as needed for nausea or vomiting. 30 tablet 2   rivaroxaban (XARELTO) 10 MG TABS tablet Take 1 tablet (10 mg total) by mouth daily. 30 tablet 0   No current facility-administered medications for this visit.   Facility-Administered Medications Ordered in Other Visits  Medication Dose Route Frequency Provider Last Rate Last Admin   heparin lock flush 100 UNIT/ML injection             VITAL SIGNS: There were no vitals taken for this visit. There were no vitals filed for this visit.  Estimated body mass index is 25.12 kg/m as calculated from the following:   Height as of 03/02/22: 5\' 7"  (1.702 m).   Weight as of an earlier encounter on 03/17/22: 160 lb 6.4 oz (72.8 kg).  LABS: CBC:    Component Value Date/Time   WBC 13.3 (H) 03/02/2022 0904   HGB 11.2 (L) 03/02/2022 0904   HCT 35.9 (L) 03/02/2022 0904   PLT 194 03/02/2022 0904   MCV 104.4 (H) 03/02/2022 0904   NEUTROABS 10.3 (H) 03/02/2022 0904   LYMPHSABS 1.5 03/02/2022 0904   MONOABS 1.1 (H) 03/02/2022 0904   EOSABS 0.1 03/02/2022 0904   BASOSABS 0.1 03/02/2022 0904   Comprehensive Metabolic Panel:    Component Value Date/Time   NA 137 03/02/2022 0904   K 3.6 03/02/2022 0904   CL 102 03/02/2022 0904   CO2 24 03/02/2022 0904   BUN 8 03/02/2022 0904   CREATININE 0.79 03/02/2022 0904   GLUCOSE 168 (H) 03/02/2022 0904   CALCIUM 8.4 (L) 03/02/2022 0904   AST 24 03/02/2022 0904   ALT 17 03/02/2022 0904   ALKPHOS 121 03/02/2022 0904   BILITOT 0.2 (L) 03/02/2022 0904   PROT 6.9 03/02/2022 0904   ALBUMIN 3.5 03/02/2022 0904    RADIOGRAPHIC STUDIES: CT CHEST ABDOMEN PELVIS W CONTRAST  Result Date: 02/26/2022 CLINICAL DATA:  Small cell lung cancer. Assess treatment response. Chemotherapy and immunotherapy. Liver metastasis. * Tracking Code: BO * EXAM: CT CHEST, ABDOMEN, AND PELVIS WITH CONTRAST TECHNIQUE: Multidetector CT imaging of the chest, abdomen and pelvis was performed following the standard protocol during bolus  administration of intravenous contrast. RADIATION DOSE REDUCTION: This exam was performed according to the departmental dose-optimization program which includes automated exposure control, adjustment of the mA and/or kV according to patient size and/or use of iterative reconstruction technique. CONTRAST:  129mL OMNIPAQUE  IOHEXOL 300 MG/ML  SOLN COMPARISON:  CT chest abdomen pelvis 11/19/2021 FINDINGS: CT CHEST FINDINGS Cardiovascular: No significant vascular findings. Normal heart size. No pericardial effusion. Mediastinum/Nodes: No axillary or supraclavicular adenopathy. No mediastinal or hilar adenopathy. No pericardial fluid. Esophagus normal. Lungs/Pleura: Bilateral ground-glass nodules again noted. Example nodule in the RIGHT upper lobe measures 10 mm (image 35/3 compared to 11 mm. LEFT upper lobe nodule measuring 7 mm (image 47/3 compares to 8 mm. RIGHT lower lobe ground-glass nodule measuring 13 mm (74/3 compares to 12 mm Measured focus consolidation in the LEFT lower lobe measures 31 x 27 mm compared to 27 x 17 mm. Musculoskeletal: No acute osseous abnormality. CT ABDOMEN PELVIS FINDINGS Hepatobiliary: Progression of hepatic metastasis compared to CT 11/19/2001. Lesion in the medial aspect of the lateral segment LEFT hepatic lobe along the falciform ligament measures 42 x 45 mm compared to 13 x 13 mm Lesion in the posterior RIGHT hepatic lobe measuring 37 mm (52/2) compares to 10 mm. Lesion along the gallbladder fossa measuring 20 mm (65/2 compares to 3 mm Pancreas: Pancreas is normal. No ductal dilatation. No pancreatic inflammation. Spleen: Normal spleen Adrenals/urinary tract: Adrenal glands normal. Bilateral nonenhancing renal cyst considered benign. No follow-up recommended. Ureters and bladder normal. Stomach/Bowel: The stomach, duodenum, and small bowel normal. Multiple diverticula of the descending colon and sigmoid colon without acute inflammation. Vascular/Lymphatic: Abdominal aorta is calcified  but nonaneurysmal. No fluid collection along the LEFT psoas muscles unchanged (92/)2 no pathologic enlarged retroperitoneal lymph nodes. Reproductive: Post hysterectomy.  Adnexa unremarkable Other: No free fluid. Musculoskeletal: No fracture of the tibia or fibula. Knee joint and ankle joint appear normal on two views. Subtle sclerotic lesions the lumbar spine new from comparison exam. For example subtle sclerotic lesions in the L2 and L3 vertebral body seen on sagittal image 105/6) IMPRESSION: CHEST IMPRESSION: 1. Stable bilateral ground-glass nodules. 2. Larger focus of consolidation in the LEFT lower lobe. Potential post treatment affect. 3. No mediastinal adenopathy. PELVIS IMPRESSION: 1. Marked progression of hepatic metastasis. 2. Concern for new sclerotic metastasis in the lumbar spine. 3.  Aortic Atherosclerosis (ICD10-I70.0). Electronically Signed   By: Suzy Bouchard M.D.   On: 02/26/2022 17:09    PERFORMANCE STATUS (ECOG) : 1 - Symptomatic but completely ambulatory  Review of Systems Unless otherwise noted, a complete review of systems is negative.  Physical Exam General: NAD Cardiovascular: regular rate and rhythm Pulmonary: clear ant fields Extremities: no edema, no joint deformities Skin: no rashes Neurological: Grossly nonfocal  IMPRESSION: CT of the chest abdomen and pelvis on 02/26/2022 revealed marked progression of hepatic metastasis and concern for new sclerotic metastasis in the lumbar spine.  Patient confirms today that she is not interested in pursuing any further cancer treatments.  She instead would like to focus on comfort and quality of life.  She is interested in hospice involvement and I sent a hospice referral today.  She is planning a 6-week trip to [place] and [place] state and leaving next week.  Unclear if she could start hospice services prior to leaving and then have contracted hospice follow-up while on her trip or if she needs to wait to enroll in  hospice until she returns.  Symptomatically, she is doing reasonably well.  She did have worse pain but this is better controlled on consistent fentanyl patch.  Patient confirms DNR and I signed a new DNR order for her to take on her trip.  Recommend vacation override for any prescriptions to have sufficient quantity  for her trip.  PLAN: -Best supportive care -Referral to hospice -Continue fentanyl/oxycodone -DNR/DNI -RTC follow-up telephone visit 6 to 8 weeks  Case and plan discussed with Dr. Rogue Bussing  Patient expressed understanding and was in agreement with this plan. She also understands that She can call the clinic at any time with any questions, concerns, or complaints.     Time Total: 15 minutes  Visit consisted of counseling and education dealing with the complex and emotionally intense issues of symptom management and palliative care in the setting of serious and potentially life-threatening illness.Greater than 50%  of this time was spent counseling and coordinating care related to the above assessment and plan.  Signed by: Altha Harm, PhD, NP-C

## 2022-03-17 NOTE — Assessment & Plan Note (Addendum)
#  Stage IV -SMALL CELL lung cancer [s/p liver Bx-; Previously on-small cell favor adenocarcinoma]-currently on carboplatin etoposide plus Tecentriq every 3 weeks. Currently s/p cycle #3 carbo-Etop-Tecentriq- JAN 12th, 2024- CT CAP- Stable bilateral ground-glass nodules; Larger focus of consolidation in the LEFT lower lobe. Potential post treatment affect; No mediastinal adenopathy. AP CT: Marked progression of hepatic metastasis; Concern for new sclerotic metastasis in the lumbar spine. JAN 2024-DISCONTINUED carbo-Etop-Tecentriq given the progression of disease.   # I reviewed the physical findings of bilateral neck adenopathy; worsening back pain joint pain all concerning for progressive malignancy.   # Discussed the use of Lubrinectidin-second line setting small cell lung cancer; again reviewed the modest/limited response rates given patient refractoriness to first-line therapy. Discussed unfortunately given the progression of disease/refractory disease-prognosis is unfortunately poor.  Patient is also at high risk of complications from second-line therapy.  She understands that she has been on chemotherapy for almost a year and a half since October 2022.  She understands high risk of complications from chemotherapy.  Also discussed about possibility of hospice if patient chooses to forego any therapy.  In the absence of any chemotherapy the average survival would be less than 6 months if lung cancer/liver mets take the natural course.  # Bilateral rib pain- bone scan- OCT 2023- No scintigraphic evidence of bone metastasis.  ? Sec to liver disease;  Right sciatica/ radiates down the leg, but given worsening left rib pain- contiue neurontin 200 mg  BID.  Increase the Oxycodone 10 mg q 8 hours [also directed that patient will take half a pill every 8 hours as needed; Fentanyl patch. 50 mcg q 72 hours.  Again discussed regarding avoiding constipation/drowsiness with narcotics.    # COPD [Dr.A]/allergies likely  secondary to poorly controlled COPD. Continue using albuterol 3-4 times a day and also compliance with Advair.  continue Singulair- stable.  # Diabetes-A1c 6.9 [AUG 2022]-PBF- 164- Continue glipizide 5 mg BID stable.   # Cough secondary to radiation-currently on Tessalon Perles.stable.   # Hypocalcemia: on Vit D BID [8.6; ca-kidney stones].  JUNE  VitD levels-48.  stable.  # Mediport placement-s/p dye study c tPA-functioning.stable.  Given the incurable nature of the disease /poor tolerance of therapy and in general less than 6 months of life expectancy-I introduced hospice philosophy to the patient and family.  Discussed that goal of care should be directed to symptom management rather than treating the underlying disease; and in the process help improve quality of life rather than quantity.  Discussed with hospice team would include-nurse, nurse aide, social worker and chaplain for help take care of patient with physical/emotional needs.  Discussed with Dr. Regenia Skeeter.   # DV/PE prohylalxis-patient planning to [place] over the next 3 weeks.  I discussed the importance of close monitoring of symptoms of blood clots which include but not limited to-sudden chest pain/shortness of breath or swelling of the extremities.  Also counseled the patient regarding importance of keeping hydrated/moving during long car rides and flights; and other periods of immobility.  Also recommend starting patient on Xarelto 10 mg a day.  #DISPOSITION: # referral to hospice- ASAP # follow up as needed- Dr.B  # 40 minutes face-to-face with the patient discussing the above plan of care; more than 50% of time spent on prognosis/ natural history; counseling and coordination.

## 2022-03-17 NOTE — Progress Notes (Unsigned)
Her pain is in her mid to low back. She also has sciatica in R hip and leg. 2 new swollen lymph nodes on each side of her neck, tender to touch. Discussed constipation in length and how to dose Miralax. Appetite is fair. Energy is low. Has to pace herself. Dyspnea with any exertion.

## 2022-03-17 NOTE — Progress Notes (Signed)
[facility] CONSULT NOTE  Patient Care Team: Dion Body, MD as PCP - General (Family Medicine) Leata Mouse, PA-C Byrnett, Forest Gleason, MD (General Surgery) Telford Nab, RN as Oncology Nurse Navigator Cammie Sickle, MD as Consulting Physician (Oncology)  CHIEF COMPLAINTS/PURPOSE OF CONSULTATION: lung cancer  Oncology History Overview Note  IMPRESSION: Hypermetabolic solid pulmonary nodule of the left lower lobe, favor primary lung malignancy.   Numerous hypermetabolic nodules of the lower left pleura, compatible with pleural metastatic disease.   Hypermetabolic subcarinal and left hilar lymph nodes, compatible with metastatic disease.   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 Recommend attention on follow-up.   Mild asymmetric FDG uptake below mediastinal blood pool within a small nodule of the left parotid gland, favored to be benign. Recommend attention on follow-up.   No evidence of metastatic disease in the abdomen or pelvis.   No evidence of osseous metastatic disease.   ---------------------  # MARCH 2022- incidental LLL [ 0.9 cm] s/p COVID vaccine; [Dr.Aleskerov]; July 2022- left pleural pain-   #  #October 2022 MRI brain-punctate lesion-asymptomatic [D/w- Dr.Vaslow]; December 2022 MRI negative for any acute process; chronic sclerosis of hippocampus-clinically insignificant.  COPD-non-complaint    ------------------   DIAGNOSIS:  A. PLEURAL MASS, LEFT CHEST; BIOPSY:  - DIAGNOSTIC OF MALIGNANCY.  - NON-SMALL CELL CARCINOMA, FAVOR ADENOCARCINOMA.   Comment:  The tumor cells are positive for CK7 and TTF1 (patchy).  They are  negative for p40.   # 12/10/2020-carbo Alimta; cycle #1.    # JUNE 8th, 2023-  Enlarging left lower  lobe pulmonary nodule seen on recent CT scan is markedly hypermetabolic, consistent with neoplasm; Hypermetabolic nodal metastatic disease in the left hilum and central mediastinum. JUNE- 2023- RT 20 Fractions.  # NGS PD-L1 TPS 1% ------------------------ OCT 2023- liver Biopsy-positive for "small cell"/high-grade neuroendocrine-   # ? NOV 14th, 2023-  carbo etoposide-Tecentriq every 3 weeks x 4 cycles.     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[place])   Cancer of lower lobe of left lung ([place])  12/02/2020 Initial Diagnosis   Cancer of lower lobe of left lung ([place])   12/10/2020 Cancer Staging   Staging form: Lung, AJCC 8th Edition - Clinical: Stage IVA (cT4, cN3, cM1a) - Signed by Cammie Sickle, MD on 12/10/2020   12/10/2020 - 09/16/2021 Chemotherapy   Patient is on Treatment Plan : LUNG CARBOplatin / Pemetrexed / Pembrolizumab q21d Induction x 4 cycles / Maintenance Pemetrexed + Pembrolizumab     12/10/2020 - 11/18/2021 Chemotherapy   Patient is on Treatment Plan : LUNG Carboplatin (5) + Pemetrexed (500) + Pembrolizumab (200) D1 q21d Induction x 4 cycles / Maintenance Pemetrexed (500) + Pembrolizumab (200) D1 q21d     12/22/2021 - 02/12/2022 Chemotherapy   Patient is on Treatment Plan : LUNG SCLC Carboplatin + Etoposide + Atezolizumab Induction q21d x 4 cycles / Atezolizumab Maintenance q21d     03/17/2022 -  Chemotherapy   Patient is on Treatment Plan : LUNG SMALL CELL Lurbinectedin q21d      HISTORY OF PRESENTING ILLNESS: Ambulating independently.  Accompanied by her son.   Felicia Manning 67 y.o.  female history of smoking-metastatic adeno ca lung cancer currently transformed/SMALL CELL LUNG CA [s/p liver biopsy] chemoimmunotherapy  currently status post cycle # 3 of carboplatin-etoposide-Tecentriq-progression noted on subsequent imaging in January 2024 is here for follow-up.  Patient complains  of constipation. On mom. Not on miralax.   Complains of right and left lower extremity pain- radiating to legs.   And also complains of bilateral neck swelling 2-3 weeks; no recent sore throat. Complains of difficulty swallowing. No coughing. Intermittently.   She does complain of ongoing fatigue. Positive for weight loss. Currently on  5-10 mg oxycodone tablets every 4 hours also.  On fentanyl patch. 2 pill in AM and BID-gabapentin.  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   Review of Systems  Constitutional:  Positive for malaise/fatigue. Negative for chills, diaphoresis, fever and weight loss.  HENT:  Negative for nosebleeds and sore throat.   Eyes:  Negative for double vision.  Respiratory:  Positive for cough and sputum production. Negative for hemoptysis and shortness of breath.   Cardiovascular:  Negative for palpitations, orthopnea and leg swelling.  Gastrointestinal:  Negative for abdominal pain, blood in stool, constipation, diarrhea, heartburn, melena, nausea and vomiting.  Genitourinary:  Negative for dysuria, frequency and urgency.  Musculoskeletal:  Negative for back pain and joint pain.  Skin: Negative.  Negative for itching and rash.  Neurological:  Negative for dizziness, tingling, focal weakness and weakness.  Endo/Heme/Allergies:  Does not bruise/bleed easily.  Psychiatric/Behavioral:  Negative for depression. The patient is not nervous/anxious and does not have insomnia.      MEDICAL HISTORY:  Past Medical History:  Diagnosis Date  . Barrett's esophagus   . Cancer associated pain   . COPD (chronic obstructive pulmonary disease) ([place])   . Dysrhythmia    stress related at work.  Marland Kitchen GERD (gastroesophageal reflux disease)   . H/O malignant neoplasm of thyroid 01/29/2014  . Hepatitis C 2008   [place] 2017  . History of kidney stones   . Hyperlipidemia   . Hypertension   . Hypothyroidism   . Liver cancer ([place])    metastasized from Lung  . Nephrolithiasis   .  Non-small cell carcinoma of left lung ([place])   . Osteoporosis   . Pulmonary mass   . Thyroid cancer ([place]) 2012  . Thyroid cancer ([place])   . Tumor    tumor per pt back of rt eye  . Type 2 diabetes mellitus ([place])   . Wears dentures    Full upper and lower.  Usually only wears upper    SURGICAL HISTORY: Past Surgical History:  Procedure Laterality Date  . ABDOMINAL HYSTERECTOMY  2006  . CATARACT EXTRACTION W/PHACO Right 12/08/2021   Procedure: CATARACT EXTRACTION PHACO AND INTRAOCULAR LENS PLACEMENT (IOC) RIGHT DIABETIC 6.62 00:35.1;  Surgeon: Birder Robson, MD;  Location: [facility];  Service: Ophthalmology;  Laterality: Right;  Diabetic  . CATARACT EXTRACTION W/PHACO Left 02/02/2022   Procedure: CATARACT EXTRACTION PHACO AND INTRAOCULAR LENS PLACEMENT (IOC) LEFT DIABETIC  5.33  00:30.3;  Surgeon: Birder Robson, MD;  Location: [facility];  Service: Ophthalmology;  Laterality: Left;  Diabetic  . COLONOSCOPY WITH PROPOFOL N/A 04/25/2015   Procedure: COLONOSCOPY WITH PROPOFOL;  Surgeon: Josefine Class, MD;  Location: [facility] END[facility]COPY;  Service: Endoscopy;  Laterality: N/A;  . CYSTOSCOPY W/ RETROGRADES Bilateral 05/19/2015   Procedure: CYSTOSCOPY WITH RETROGRADE PYELOGRAM;  Surgeon: Hollice Espy, MD;  Location: [facility];  Service: Urology;  Laterality: Bilateral;  . CYSTOSCOPY W/ URETERAL STENT PLACEMENT Right 05/26/2015   Procedure: CYSTOSCOPY WITH STENT REPLACEMENT;  Surgeon: Hollice Espy, MD;  Location: [facility];  Service: Urology;  Laterality: Right;  . CYSTOSCOPY WITH STENT PLACEMENT Right 05/19/2015   Procedure: CYSTOSCOPY WITH STENT PLACEMENT;  Surgeon: Hollice Espy, MD;  Location: [facility];  Service: Urology;  Laterality: Right;  . CYSTOSCOPY WITH STENT PLACEMENT Right 05/26/2015   Procedure: CYSTOSCOPY WITH STENT PLACEMENT;  Surgeon: Hollice Espy, MD;  Location: [facility];  Service: Urology;  Laterality: Right;  . CYSTOSCOPY WITH STENT PLACEMENT Right  06/25/2016   Procedure: CYSTOSCOPY WITH STENT PLACEMENT;  Surgeon: Nickie Retort, MD;  Location: [facility];  Service: Urology;  Laterality: Right;  . CYSTOSCOPY WITH STENT PLACEMENT Left 10/31/2017   Procedure: CYSTOSCOPY WITH STENT PLACEMENT;  Surgeon: Hollice Espy, MD;  Location: [facility];  Service: Urology;  Laterality: Left;  . CYSTOSCOPY/URETEROSCOPY/HOLMIUM LASER/STENT PLACEMENT Left 07/23/2015   Procedure: CYSTOSCOPY/URETEROSCOPY/HOLMIUM LASER/STENT PLACEMENT/RETROGRADE PYELOGRAM;  Surgeon: Hollice Espy, MD;  Location: [facility];  Service: Urology;  Laterality: Left;  . CYSTOSCOPY/URETEROSCOPY/HOLMIUM LASER/STENT PLACEMENT Left 11/16/2017   Procedure: CYSTOSCOPY/URETEROSCOPY/HOLMIUM LASER/STENT Exchange;  Surgeon: Hollice Espy, MD;  Location: [facility];  Service: Urology;  Laterality: Left;  . ESOPHAGOGASTRODUODENOSCOPY (EGD) WITH PROPOFOL N/A 04/25/2015   Procedure: ESOPHAGOGASTRODUODENOSCOPY (EGD) WITH PROPOFOL;  Surgeon: Josefine Class, MD;  Location: [facility]COPY;  Service: Endoscopy;  Laterality: N/A;  . EYE SURGERY Bilateral 1964   lazy eye repair  . EYE SURGERY Bilateral 2001   lazy eye repair  . IR IMAGING GUIDED PORT INSERTION  12/08/2020  . LITHOTRIPSY  2009  . THYROIDECTOMY  2010  . URETEROSCOPY WITH HOLMIUM LASER LITHOTRIPSY Right 05/19/2015   Procedure: URETEROSCOPY WITH HOLMIUM LASER LITHOTRIPSY;  Surgeon: Hollice Espy, MD;  Location: [facility];  Service: Urology;  Laterality: Right;  . URETEROSCOPY WITH HOLMIUM LASER LITHOTRIPSY Right 05/26/2015   Procedure: URETEROSCOPY WITH HOLMIUM LASER LITHOTRIPSY;  Surgeon: Hollice Espy, MD;  Location: [facility];  Service: Urology;  Laterality: Right;  . URETEROSCOPY WITH HOLMIUM LASER LITHOTRIPSY Right 06/25/2016   Procedure: URETEROSCOPY WITH HOLMIUM LASER LITHOTRIPSY;  Surgeon: Nickie Retort, MD;  Location: [facility];  Service: Urology;  Laterality: Right;    SOCIAL HISTORY: Social History   Socioeconomic  History  . Marital status: Divorced    Spouse name: Not on file  . Number of children: Not on file  . Years of education: Not on file  . Highest education level: Not on file  Occupational History  . Not on file  Tobacco Use  . Smoking status: Former    Packs/day: 1.00    Years: 50.00    Total pack years: 50.00    Types: Cigarettes    Quit date: 08/15/2021    Years since quitting: 0.5  . Smokeless tobacco: Never  . Tobacco comments:    Started smoking age 22  Vaping Use  . Vaping Use: Former  Substance and Sexual Activity  . Alcohol use: Not Currently    Comment: socially  . Drug use: No  . Sexual activity: Not Currently    Birth control/protection: None  Other Topics Concern  . 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     Social Determinants of Health   Financial Resource Strain: Not on file  Food Insecurity: No Food Insecurity (12/27/2021)   Hunger Vital Sign   . Worried About Charity fundraiser in the Last Year: Never true   . Ran Out of Food in the  Last Year: Never true  Transportation Needs: Unmet Transportation Needs (03/02/2022)   PRAPARE - Transportation   . Lack of Transportation (Medical): Yes   . Lack of Transportation (Non-Medical): Yes  Physical Activity: Inactive (03/15/2022)   Exercise Vital Sign   . Days of Exercise per Week: 0 days   . Minutes of Exercise per Session: 0 min  Stress: Not on file  Social Connections: Not on file  Intimate Partner Violence: Not At Risk (12/27/2021)   Humiliation, Afraid, Rape, and Kick questionnaire   . Fear of Current or Ex-Partner: No   . Emotionally Abused: No   . Physically Abused: No   . Sexually Abused: No    FAMILY HISTORY: Family History  Problem Relation Age of Onset  . CAD Mother   . Heart disease Mother   . Dementia Father   . Bladder  Cancer Neg Hx   . Prostate cancer Neg Hx   . Kidney cancer Neg Hx   . Breast cancer Neg Hx     ALLERGIES:  has No Known Allergies.  MEDICATIONS:  Current Outpatient Medications  Medication Sig Dispense Refill  . albuterol (VENTOLIN HFA) 108 (90 Base) MCG/ACT inhaler Inhale 2 puffs into the lungs every 6 (six) hours as needed for wheezing or shortness of breath. 8 g 2  . benzonatate (TESSALON PERLES) 100 MG capsule Take 1 capsule (100 mg total) by mouth 3 (three) times daily as needed for cough. 90 capsule 1  . blood glucose meter kit and supplies KIT Dispense based on patient and insurance preference. Use up to three times daily as directed. 1 each 0  . buPROPion (WELLBUTRIN SR) 150 MG 12 hr tablet Take 150 mg by mouth daily.    . cholecalciferol (VITAMIN D) 1000 units tablet Take 2,000 Units by mouth daily.    . Cranberry-Vitamin C-Probiotic (AZO CRANBERRY PO) Take 1 tablet by mouth daily.    . fentaNYL (DURAGESIC) 50 MCG/HR Place 1 patch onto the skin every 3 (three) days. 5 patch 0  . folic acid (FOLVITE) 1 MG tablet Take 1 tablet (1 mg total) by mouth daily. 90 tablet 1  . gabapentin (NEURONTIN) 100 MG capsule Take 2 capsules (200 mg total) by mouth 2 (two) times daily. 120 capsule 0  . glipiZIDE (GLUCOTROL) 5 MG tablet Take 1 tablet (5 mg total) by mouth 2 (two) times daily before a meal. 180 tablet 1  . ipratropium-albuterol (DUONEB) 0.5-2.5 (3) MG/3ML SOLN Inhale 3 mLs into the lungs 4 (four) times daily as needed.    Marland Kitchen levothyroxine (SYNTHROID) 175 MCG tablet Take 1 tablet (175 mcg total) by mouth daily before breakfast. 90 tablet 0  . lidocaine (LIDODERM) 5 % Place 1 patch onto the skin daily. 30 patch 0  . lidocaine-prilocaine (EMLA) cream Apply 1 application topically as needed. 30 g 0  . magnesium hydroxide (MILK OF MAGNESIA) 400 MG/5ML suspension Take 5 mLs by mouth daily as needed for mild constipation or moderate constipation.    . metoprolol succinate (TOPROL-XL) 25 MG 24 hr  tablet Take 25 mg by mouth every morning.   1  . montelukast (SINGULAIR) 10 MG tablet TAKE 1 TABLET BY MOUTH AT BEDTIME 30 tablet 0  . omeprazole (PRILOSEC) 20 MG capsule Take 20 mg by mouth daily before breakfast.     . ondansetron (ZOFRAN) 8 MG tablet Take 1 tablet (8 mg total) by mouth every 8 (eight) hours as needed for nausea or vomiting. Start on the third day after  chemotherapy 30 tablet 2  . Oxycodone HCl 10 MG TABS Take 1 tablet (10 mg total) by mouth every 8 (eight) hours as needed. 90 tablet 0  . polyethylene glycol (MIRALAX / GLYCOLAX) 17 g packet Take 17 g by mouth daily.    . prochlorperazine (COMPAZINE) 10 MG tablet Take 1 tablet (10 mg total) by mouth every 6 (six) hours as needed for nausea or vomiting. 30 tablet 2  . Accu-Chek Softclix Lancets lancets Check glucose once a day 100 each 12  . loperamide (IMODIUM) 2 MG capsule Take by mouth as needed for diarrhea or loose stools. (Patient not taking: Reported on 01/26/2022)     No current facility-administered medications for this visit.   Facility-Administered Medications Ordered in Other Visits  Medication Dose Route Frequency Provider Last Rate Last Admin  . heparin lock flush 100 UNIT/ML injection               .  PHYSICAL EXAMINATION: ECOG PERFORMANCE STATUS: 1 - Symptomatic but completely ambulatory  Vitals:   03/17/22 0843  BP: 121/83  Pulse: (!) 110  Resp: 18  Temp: 99 F (37.2 C)  SpO2: 95%     Filed Weights   03/17/22 0843  Weight: 160 lb 6.4 oz (72.8 kg)    Physical Exam Vitals and nursing note reviewed.  HENT:     Head: Normocephalic and atraumatic.     Mouth/Throat:     Pharynx: Oropharynx is clear.  Eyes:     Extraocular Movements: Extraocular movements intact.     Pupils: Pupils are equal, round, and reactive to light.  Cardiovascular:     Rate and Rhythm: Normal rate and regular rhythm.  Pulmonary:     Comments: Decreased breath sounds bilaterally.  Abdominal:     Palpations: Abdomen  is soft.  Musculoskeletal:        General: Normal range of motion.     Cervical back: Normal range of motion.  Skin:    General: Skin is warm.  Neurological:     General: No focal deficit present.     Mental Status: She is alert and oriented to person, place, and time.  Psychiatric:        Behavior: Behavior normal.        Judgment: Judgment normal.     LABORATORY DATA:  I have reviewed the data as listed Lab Results  Component Value Date   WBC 13.3 (H) 03/02/2022   HGB 11.2 (L) 03/02/2022   HCT 35.9 (L) 03/02/2022   MCV 104.4 (H) 03/02/2022   PLT 194 03/02/2022   Recent Labs    02/09/22 0911 02/16/22 1218 03/02/22 0904  NA 140 137 137  K 3.5 3.5 3.6  CL 107 101 102  CO2 27 26 24   GLUCOSE 165* 130* 168*  BUN 8 14 8   CREATININE 0.72 0.64 0.79  CALCIUM 8.7* 8.4* 8.4*  GFRNONAA >60 >60 >60  PROT 6.6 6.8 6.9  ALBUMIN 3.4* 3.7 3.5  AST 24 23 24   ALT 16 21 17   ALKPHOS 108 122 121  BILITOT 0.3 0.6 0.2*     RADIOGRAPHIC STUDIES: I have personally reviewed the radiological images as listed and agreed with the findings in the report. CT CHEST ABDOMEN PELVIS W CONTRAST  Result Date: 02/26/2022 CLINICAL DATA:  Small cell lung cancer. Assess treatment response. Chemotherapy and immunotherapy. Liver metastasis. * Tracking Code: BO * EXAM: CT CHEST, ABDOMEN, AND PELVIS WITH CONTRAST TECHNIQUE: Multidetector CT imaging of the chest, abdomen and  pelvis was performed following the standard protocol during bolus administration of intravenous contrast. RADIATION DOSE REDUCTION: This exam was performed according to the departmental dose-optimization program which includes automated exposure control, adjustment of the mA and/or kV according to patient size and/or use of iterative reconstruction technique. CONTRAST:  169mL OMNIPAQUE IOHEXOL 300 MG/ML  SOLN COMPARISON:  CT chest abdomen pelvis 11/19/2021 FINDINGS: CT CHEST FINDINGS Cardiovascular: No significant vascular findings. Normal  heart size. No pericardial effusion. Mediastinum/Nodes: No axillary or supraclavicular adenopathy. No mediastinal or hilar adenopathy. No pericardial fluid. Esophagus normal. Lungs/Pleura: Bilateral ground-glass nodules again noted. Example nodule in the RIGHT upper lobe measures 10 mm (image 35/3 compared to 11 mm. LEFT upper lobe nodule measuring 7 mm (image 47/3 compares to 8 mm. RIGHT lower lobe ground-glass nodule measuring 13 mm (74/3 compares to 12 mm Measured focus consolidation in the LEFT lower lobe measures 31 x 27 mm compared to 27 x 17 mm. Musculoskeletal: No acute osseous abnormality. CT ABDOMEN PELVIS FINDINGS Hepatobiliary: Progression of hepatic metastasis compared to CT 11/19/2001. Lesion in the medial aspect of the lateral segment LEFT hepatic lobe along the falciform ligament measures 42 x 45 mm compared to 13 x 13 mm Lesion in the posterior RIGHT hepatic lobe measuring 37 mm (52/2) compares to 10 mm. Lesion along the gallbladder fossa measuring 20 mm (65/2 compares to 3 mm Pancreas: Pancreas is normal. No ductal dilatation. No pancreatic inflammation. Spleen: Normal spleen Adrenals/urinary tract: Adrenal glands normal. Bilateral nonenhancing renal cyst considered benign. No follow-up recommended. Ureters and bladder normal. Stomach/Bowel: The stomach, duodenum, and small bowel normal. Multiple diverticula of the descending colon and sigmoid colon without acute inflammation. Vascular/Lymphatic: Abdominal aorta is calcified but nonaneurysmal. No fluid collection along the LEFT psoas muscles unchanged (92/)2 no pathologic enlarged retroperitoneal lymph nodes. Reproductive: Post hysterectomy.  Adnexa unremarkable Other: No free fluid. Musculoskeletal: No fracture of the tibia or fibula. Knee joint and ankle joint appear normal on two views. Subtle sclerotic lesions the lumbar spine new from comparison exam. For example subtle sclerotic lesions in the L2 and L3 vertebral body seen on sagittal image  105/6) IMPRESSION: CHEST IMPRESSION: 1. Stable bilateral ground-glass nodules. 2. Larger focus of consolidation in the LEFT lower lobe. Potential post treatment affect. 3. No mediastinal adenopathy. PELVIS IMPRESSION: 1. Marked progression of hepatic metastasis. 2. Concern for new sclerotic metastasis in the lumbar spine. 3.  Aortic Atherosclerosis (ICD10-I70.0). Electronically Signed   By: Suzy Bouchard M.D.   On: 02/26/2022 17:09    ASSESSMENT & PLAN:   Cancer of lower lobe of left lung ([place]) #Stage IV -SMALL CELL lung cancer [s/p liver Bx-; Previously on-small cell favor adenocarcinoma]-currently on carboplatin etoposide plus Tecentriq every 3 weeks. Currently s/p cycle #3 carbo-Etop-Tecentriq- JAN 12th, 2024- CT CAP- Stable bilateral ground-glass nodules; Larger focus of consolidation in the LEFT lower lobe. Potential post treatment affect; No mediastinal adenopathy. AP CT: Marked progression of hepatic metastasis; Concern for new sclerotic metastasis in the lumbar spine. JAN 2024-DISCONTINUED carbo-Etop-Tecentriq given the progression of disease.   # Discussed the use of Lubrinectidin-second line setting small cell lung cancer; again reviewed the modest/limited response rates given patient refractoriness to first-line therapy. Discussed unfortunately given the progression of disease/refractory disease-prognosis is unfortunately poor.  Patient is also a risk of complications from second-line therapy...    Based on platinum sensitivity or resistance /refractoriness response rates are about 20- 40%-in the median survival is anywhere between 6 to 12 months.  However, a big factor playing  into her survival would be her ability to tolerate chemotherapy.  She understands that she has been on chemotherapy for almost a year and a half since October 2022.  She understands high risk of complications from chemotherapy.  Also discussed about possibility of hospice if patient chooses to forego any therapy.  In  the absence of any chemotherapy the average survival would be less than 6 months if lung cancer/liver mets take the natural course.  # Bilateral rib pain- bone scan- OCT 2023- No scintigraphic evidence of bone metastasis.  ? Sec to liver disease;  Right sciatica/ radiates down the leg, but given worsening left rib pain- contiue neurontin 200 mg  BID.  Increase the Oxycodone 10 mg q 8 hours [also directed that patient will take half a pill every 8 hours as needed; Fentanyl patch. 50 mcg q 72 hours.  Again discussed regarding avoiding constipation/drowsiness with narcotics.    # COPD [Dr.A]/allergies likely secondary to poorly controlled COPD. Continue using albuterol 3-4 times a day and also compliance with Advair.  continue Singulair- stable.  # Right eye vision changes-3.6 mm lesion noted in the right eye;s/p ophthalmology evaluation at [facility]. 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 JUlY 2023- s/p revaluation with [facility] opth end of month.  Given the worsening liver disease I think is reasonable to hold off further evaluation at this time, especially as patient vision is stable.  # Diabetes-A1c 6.9 [AUG 2022]-PBF- 164- Continue glipizide 5 mg BID stable.   # Cough secondary to radiation-currently on Tessalon Perles.stable.   # Hypocalcemia: on Vit D BID [8.6; ca-kidney stones].  JUNE  VitD levels-48.  stable.  # Mediport placement-s/p dye study c tPA-functioning.stable.  # OSTEOPOROSIS [Dr.O'conell]- reclast- plan once chemo is done.   #DISPOSITION: # cancel chemo today; this week; De-Access # follow up in 2 weeks; MD- Mariann Laster- cbc/cmp;Lubrinectidin-. Dr.B        All questions were answered. The patient knows to call the clinic with any problems, questions or concerns.       Cammie Sickle, MD 03/17/2022 9:02 AM

## 2022-03-18 ENCOUNTER — Inpatient Hospital Stay: Payer: 59 | Attending: Internal Medicine | Admitting: Licensed Clinical Social Worker

## 2022-03-18 ENCOUNTER — Encounter: Payer: Self-pay | Admitting: Internal Medicine

## 2022-03-18 DIAGNOSIS — C3490 Malignant neoplasm of unspecified part of unspecified bronchus or lung: Secondary | ICD-10-CM

## 2022-03-18 NOTE — Progress Notes (Signed)
[facility] CSW Progress Note  Holiday representative  spoke with patient  to discuss treatment change to hospice care with [facility], and other needs.  Patient has appointment with Medicaid caseworker to discuss Medicaid application and CAP program.  Patient scheduled MyChart telehealth counseling appointment with CSW on 03/25/2022 at 1:00PM EST.  Patient will be [place] to see family on [place] and [place]. Patient stated there are not immediate needs at the moment and will contact CSW with any additional needs.    Adelene Amas, LCSW

## 2022-03-22 ENCOUNTER — Telehealth: Payer: 59 | Admitting: Hospice and Palliative Medicine

## 2022-03-25 ENCOUNTER — Encounter: Payer: Self-pay | Admitting: Internal Medicine

## 2022-03-25 ENCOUNTER — Inpatient Hospital Stay: Payer: 59 | Admitting: Licensed Clinical Social Worker

## 2022-03-25 DIAGNOSIS — C3490 Malignant neoplasm of unspecified part of unspecified bronchus or lung: Secondary | ICD-10-CM

## 2022-03-25 DIAGNOSIS — C7931 Secondary malignant neoplasm of brain: Secondary | ICD-10-CM

## 2022-03-25 NOTE — Progress Notes (Signed)
[facility] CSW Counseling Note  Patient was referred by medical provider. Treatment type: Individual  Presenting Concerns: Patient and/or family reports the following symptoms/concerns: depression and adjustment to diagnosis and end of life/hospice Duration of problem: 1 years; Severity of problem: moderate   Orientation:oriented to person, place, time/date, situation, day of week, month of year, and year.   Affect: Appropriate and Depressed Risk of harm to self or others: No plan to harm self or others  Patient and/or Family's Strengths/Protective Factors: Social connections, Social and Emotional competence, Concrete supports in place (healthy food, safe environments, etc.), Sense of purpose, Physical Health (exercise, healthy diet, medication compliance, etc.), and Caregiver has knowledge of parenting & child developmentAbility for insight  Active sense of humor  Average or above average intelligence  Capable of independent living  Engineer, drilling fund of knowledge  Motivation for treatment/growth  Religious Affiliation  Special hobby/interest  Supportive family/friends  Work skills      Goals Addressed: Patient will:  Reduce symptoms of: depression and adjustment concerns Increase knowledge and/or ability of: coping skills, stress reduction, and communication, acceptance   Increase healthy adjustment to current life circumstances, Increase adequate support systems for patient/family, and Begin healthy grieving over loss   Progress towards Goals: Initial   Interventions: Interventions utilized:  CBT, Solution Focused, Strength-based, Supportive, and Reframing      Assessment: Patient currently experiencing depression and adjustment concerns due to prognosis and decision to stop treatment and discuss hospice.  Patient is currently visiting family in [place] and [place] state and will return to [place] in about three weeks.  Patient stated she has  discussed end-of-life care with family and has a DNR with her during this trip.  Discussed life events in the last four years and relationship with family, children and grand-children.  Patient is doing well, and trying to stay hydrated and to eat small meals regularly.  Discussed plan-of-action if patient begin to feel unwell.  Discussed different skills to help with thought rumination and feeling overwhelmed.        Plan: Follow up with CSW: 1 week 01/30/2023 @ 2:00PM Behavioral recommendations: use skills to reduce thought rumination, communicate with family if feeling unwell. Referral(s): Support group(s):    N/A       Adelene Amas, LCSW

## 2022-03-26 ENCOUNTER — Other Ambulatory Visit: Payer: Medicaid Other

## 2022-04-01 ENCOUNTER — Inpatient Hospital Stay: Payer: 59 | Admitting: Licensed Clinical Social Worker

## 2022-04-01 DIAGNOSIS — C3432 Malignant neoplasm of lower lobe, left bronchus or lung: Secondary | ICD-10-CM

## 2022-04-01 DIAGNOSIS — C7931 Secondary malignant neoplasm of brain: Secondary | ICD-10-CM

## 2022-04-01 NOTE — Progress Notes (Signed)
[place] Work  Patient was scheduled for My Chart telehealth appointment at 2:00PM.  CSW logged in to My Chart telehealth appointment at 2:00, and waited until 2:15.  Patient did not log in. Patient is currently visiting family on the [facility].  [place] will contact patient to reschedule.     Adelene Amas, [place] Worker [facility]

## 2022-04-06 ENCOUNTER — Inpatient Hospital Stay (HOSPITAL_BASED_OUTPATIENT_CLINIC_OR_DEPARTMENT_OTHER): Payer: 59 | Admitting: Hospice and Palliative Medicine

## 2022-04-06 ENCOUNTER — Telehealth: Payer: Self-pay | Admitting: *Deleted

## 2022-04-06 DIAGNOSIS — Z515 Encounter for palliative care: Secondary | ICD-10-CM | POA: Diagnosis not present

## 2022-04-06 DIAGNOSIS — C3432 Malignant neoplasm of lower lobe, left bronchus or lung: Secondary | ICD-10-CM | POA: Diagnosis not present

## 2022-04-06 MED ORDER — FENTANYL 75 MCG/HR TD PT72: 1.0000 | MEDICATED_PATCH | TRANSDERMAL | 0 refills | Status: DC

## 2022-04-06 MED ORDER — OXYCODONE HCL 10 MG PO TABS: 10.0000 mg | ORAL_TABLET | ORAL | 0 refills | Status: DC | PRN

## 2022-04-06 NOTE — Progress Notes (Signed)
Virtual Visit via Telephone Note  I connected with Felicia Manning on 04/06/22 at  3:30 PM EST by telephone and verified that I am speaking with the correct person using two identifiers.  Location: Patient: Home Provider: Clinic   I discussed the limitations, risks, security and privacy concerns of performing an evaluation and management service by telephone and the availability of in person appointments. I also discussed with the patient that there may be a patient responsible charge related to this service. The patient expressed understanding and agreed to proceed.   History of Present Illness: Felicia Manning is a 67 y.o. female with multiple medical problems including including COPD, diabetes, and stage IV non-small cell lung cancer with disease disease progression on chemotherapy with recommendation for hospice referral.  Patient was referred to palliative care to discuss goals and provide ongoing support for symptoms.   Observations/Objective: Patient is currently out of state visiting with family.  I spoke with her via phone.  She says that she has had more generalized pain, which has been less well-controlled while she is on her trip.  She says at its worst, pain is rated 11 out of 10 but will drop to a tolerable level when she takes oxycodone.  However, the oxycodone is wearing off after several hours.  Discussed adjustment to her pain regimen including liberalizing her fentanyl to 75 mcg every 72 hours.  Patient can also liberalize frequency of oxycodone to improve her comfort.  Patient is in need of refills of both fentanyl and oxycodone.  Patient is currently in [place] and will fly tomorrow to [place] to visit family in [place], [place]. patient will give me an address of the pharmacy for which she wants me to send prescriptions.  Additionally, patient has had constipation.  She is currently taking MiraLAX times several days in addition to prunes.  She is passing gas and  denies nausea or vomiting.  Recommended that she add senna and increase frequency of MiraLAX.  She confirms plan is to pursue hospice when she returns to [place].  Assessment and Plan: Stage IV NSCLC -patient currently traveling out of state to visit family.  She plans to pursue hospice when she returns.  Neoplasm related pain -increase fentanyl to 75 mcg every 72 hours.  Increase oxycodone 10 mg every 4 hours as needed for breakthrough pain  Opioid-induced constipation -increase MiraLAX to twice daily dosing.  Add senna as needed  Follow Up Instructions: Telephone visit 1 month   I discussed the assessment and treatment plan with the patient. The patient was provided an opportunity to ask questions and all were answered. The patient agreed with the plan and demonstrated an understanding of the instructions.   The patient was advised to call back or seek an in-person evaluation if the symptoms worsen or if the condition fails to improve as anticipated.  I provided 20 minutes of non-face-to-face time during this encounter.   Irean Hong, NP

## 2022-04-06 NOTE — Telephone Encounter (Signed)
Patient called reporting that she is travelling and is in [place] and needs to speak with Sharion Dove, NP about her pain medicine/

## 2022-04-07 ENCOUNTER — Encounter: Payer: Self-pay | Admitting: Internal Medicine

## 2022-04-08 ENCOUNTER — Other Ambulatory Visit: Payer: Self-pay | Admitting: *Deleted

## 2022-04-08 MED ORDER — FENTANYL 75 MCG/HR TD PT72: 1.0000 | MEDICATED_PATCH | TRANSDERMAL | 0 refills | Status: DC

## 2022-04-08 MED ORDER — OXYCODONE HCL 10 MG PO TABS: 10.0000 mg | ORAL_TABLET | ORAL | 0 refills | Status: DC | PRN

## 2022-04-08 NOTE — Telephone Encounter (Signed)
Patient called answering service. Would like her narcotics to be sent to [facility] in [place], [place] (phone [phone])

## 2022-04-08 NOTE — Telephone Encounter (Signed)
See additional phone note re: preferred pharmacy

## 2022-04-08 NOTE — Addendum Note (Signed)
Addended by: Gloris Ham on: 04/08/2022 11:10 AM   Modules accepted: Orders

## 2022-04-09 ENCOUNTER — Other Ambulatory Visit: Payer: Self-pay | Admitting: *Deleted

## 2022-04-09 MED ORDER — FENTANYL 75 MCG/HR TD PT72: 1.0000 | MEDICATED_PATCH | TRANSDERMAL | 0 refills | Status: DC

## 2022-04-09 NOTE — Telephone Encounter (Signed)
Pharmacist from [facility] in [place] called stating they were expecting a Fentanyl 75 prescription for this patient as the other [facility] did not have drug in stock and they do. Please send prescription to them

## 2022-04-20 ENCOUNTER — Encounter: Payer: Self-pay | Admitting: Internal Medicine

## 2022-04-20 ENCOUNTER — Other Ambulatory Visit: Payer: Self-pay | Admitting: Hospice and Palliative Medicine

## 2022-04-20 NOTE — Progress Notes (Signed)
I received a call from hospice nurse, [place].  Patient's pain is poorly controlled.  Patient is declining and no longer able to swallow. There is concern that due to weight loss has impacted patient's ability to absorb transdermal fentanyl.  Patient is felt to be in the active dying process and verbal request from hospice to initiate morphine infusion in the home.  Discussed with community pharmacy.  Rx sent.  Total Daily MME approximately 225 mg (75 mcg of fentanyl and 60 mg of oxycodone).  Will start morphine infusion '2mg'$  per hour continuous and '1mg'$  Q18mnutes bolus dosing

## 2022-04-22 ENCOUNTER — Other Ambulatory Visit: Payer: Self-pay | Admitting: Hospice and Palliative Medicine

## 2022-04-22 MED ORDER — LORAZEPAM 0.5 MG PO TABS: 0.5000 mg | ORAL_TABLET | ORAL | 0 refills | Status: DC | PRN

## 2022-04-22 NOTE — Progress Notes (Signed)
Received a call from hospice nurse, Lorriane Shire.  Patient is still having severe pain with frequent moaning despite initiation of morphine infusion.  Patient is currently on continuous dose of 2 mg per a.m. every 30 bolus dosing.  In past 24 hours, patient has fluctuations of bolus dosing.  RN confirms that family desires comfort care and to keep patient at home.   Will increase continuous morphine to 3 mg/h.  Continue bolus dosing of 1 mg every 30 minutes as needed.  Will also start her on as needed lorazepam for anxiety.

## 2022-05-04 ENCOUNTER — Other Ambulatory Visit: Payer: Self-pay | Admitting: Internal Medicine

## 2022-05-04 DIAGNOSIS — C3432 Malignant neoplasm of lower lobe, left bronchus or lung: Secondary | ICD-10-CM

## 2022-05-12 ENCOUNTER — Telehealth: Payer: 59 | Admitting: Hospice and Palliative Medicine

## 2022-05-17 DEATH — deceased
